# Patient Record
Sex: Female | Born: 1940 | Race: Asian | Hispanic: No
Health system: Southern US, Community
[De-identification: ages and names within clinical notes are randomized; demographics above are authoritative.]

## PROBLEM LIST (undated history)

## (undated) DIAGNOSIS — E119 Type 2 diabetes mellitus without complications: Secondary | ICD-10-CM

## (undated) DIAGNOSIS — E781 Pure hyperglyceridemia: Secondary | ICD-10-CM

## (undated) DIAGNOSIS — R296 Repeated falls: Secondary | ICD-10-CM

## (undated) DIAGNOSIS — Z9989 Dependence on other enabling machines and devices: Secondary | ICD-10-CM

## (undated) DIAGNOSIS — G8929 Other chronic pain: Secondary | ICD-10-CM

## (undated) DIAGNOSIS — S065X9A Traumatic subdural hemorrhage with loss of consciousness of unspecified duration, initial encounter: Secondary | ICD-10-CM

## (undated) DIAGNOSIS — J189 Pneumonia, unspecified organism: Secondary | ICD-10-CM

## (undated) DIAGNOSIS — I4891 Unspecified atrial fibrillation: Secondary | ICD-10-CM

## (undated) DIAGNOSIS — M549 Dorsalgia, unspecified: Secondary | ICD-10-CM

## (undated) DIAGNOSIS — K219 Gastro-esophageal reflux disease without esophagitis: Secondary | ICD-10-CM

## (undated) DIAGNOSIS — M1990 Unspecified osteoarthritis, unspecified site: Secondary | ICD-10-CM

## (undated) DIAGNOSIS — S22080A Wedge compression fracture of T11-T12 vertebra, initial encounter for closed fracture: Secondary | ICD-10-CM

## (undated) DIAGNOSIS — G4733 Obstructive sleep apnea (adult) (pediatric): Secondary | ICD-10-CM

## (undated) HISTORY — DX: Repeated falls: R29.6

---

## 1988-10-17 HISTORY — PX: TOTAL ABDOMINAL HYSTERECTOMY: SHX209

## 1999-06-18 HISTORY — PX: CATARACT EXTRACTION W/ INTRAOCULAR LENS  IMPLANT, BILATERAL: SHX1307

## 2008-01-18 ENCOUNTER — Emergency Department (HOSPITAL_COMMUNITY): Admission: EM | Admit: 2008-01-18 | Discharge: 2008-01-18 | Payer: Self-pay | Admitting: Emergency Medicine

## 2010-10-17 DIAGNOSIS — S22080A Wedge compression fracture of T11-T12 vertebra, initial encounter for closed fracture: Secondary | ICD-10-CM

## 2010-10-17 HISTORY — DX: Wedge compression fracture of T11-T12 vertebra, initial encounter for closed fracture: S22.080A

## 2010-10-17 HISTORY — PX: APPENDECTOMY: SHX54

## 2011-07-12 LAB — DIFFERENTIAL
Basophils Absolute: 0.1
Basophils Relative: 1
Eosinophils Absolute: 0.1
Eosinophils Relative: 0
Monocytes Absolute: 0.4

## 2011-07-12 LAB — CBC
HCT: 39.3
MCV: 90.2
Platelets: 323
RBC: 4.35
WBC: 15.4 — ABNORMAL HIGH

## 2011-07-12 LAB — URINALYSIS, ROUTINE W REFLEX MICROSCOPIC
Bilirubin Urine: NEGATIVE
Ketones, ur: 15 — AB
Nitrite: NEGATIVE
Protein, ur: 30 — AB
Urobilinogen, UA: 0.2

## 2011-07-12 LAB — COMPREHENSIVE METABOLIC PANEL
AST: 18
Albumin: 4.5
Alkaline Phosphatase: 41
BUN: 15
CO2: 24
Chloride: 106
GFR calc Af Amer: >60
GFR calc non Af Amer: >60
Potassium: 3.6
Total Bilirubin: 0.7

## 2011-07-12 LAB — URINE MICROSCOPIC-ADD ON

## 2011-12-20 ENCOUNTER — Ambulatory Visit: Payer: Medicare Other | Attending: Orthopaedic Surgery | Admitting: Physical Therapy

## 2011-12-20 DIAGNOSIS — M2550 Pain in unspecified joint: Secondary | ICD-10-CM | POA: Insufficient documentation

## 2011-12-20 DIAGNOSIS — R293 Abnormal posture: Secondary | ICD-10-CM | POA: Insufficient documentation

## 2011-12-20 DIAGNOSIS — M6281 Muscle weakness (generalized): Secondary | ICD-10-CM | POA: Insufficient documentation

## 2011-12-20 DIAGNOSIS — R5381 Other malaise: Secondary | ICD-10-CM | POA: Insufficient documentation

## 2011-12-20 DIAGNOSIS — IMO0001 Reserved for inherently not codable concepts without codable children: Secondary | ICD-10-CM | POA: Insufficient documentation

## 2011-12-23 ENCOUNTER — Encounter: Payer: Medicare Other | Admitting: Physical Therapy

## 2011-12-28 ENCOUNTER — Ambulatory Visit: Payer: Medicare Other | Admitting: Physical Therapy

## 2011-12-30 ENCOUNTER — Ambulatory Visit: Payer: Medicare Other | Admitting: Physical Therapy

## 2012-01-04 ENCOUNTER — Encounter: Payer: Medicare Other | Admitting: Physical Therapy

## 2012-01-06 ENCOUNTER — Ambulatory Visit: Payer: Medicare Other | Admitting: Physical Therapy

## 2012-01-12 ENCOUNTER — Ambulatory Visit: Payer: Medicare Other | Admitting: Physical Therapy

## 2012-01-25 ENCOUNTER — Ambulatory Visit: Payer: Medicare Other | Attending: Orthopaedic Surgery | Admitting: Physical Therapy

## 2012-01-25 DIAGNOSIS — M25619 Stiffness of unspecified shoulder, not elsewhere classified: Secondary | ICD-10-CM | POA: Insufficient documentation

## 2012-01-25 DIAGNOSIS — IMO0001 Reserved for inherently not codable concepts without codable children: Secondary | ICD-10-CM | POA: Insufficient documentation

## 2012-01-25 DIAGNOSIS — M25519 Pain in unspecified shoulder: Secondary | ICD-10-CM | POA: Insufficient documentation

## 2012-01-25 DIAGNOSIS — M6281 Muscle weakness (generalized): Secondary | ICD-10-CM | POA: Insufficient documentation

## 2012-01-30 ENCOUNTER — Encounter: Payer: Medicare Other | Admitting: Physical Therapy

## 2012-02-01 ENCOUNTER — Encounter: Payer: Medicare Other | Admitting: Physical Therapy

## 2012-02-03 ENCOUNTER — Encounter: Payer: Medicare Other | Admitting: Physical Therapy

## 2012-02-08 ENCOUNTER — Ambulatory Visit: Payer: Medicare Other | Admitting: Physical Therapy

## 2012-02-10 ENCOUNTER — Encounter: Payer: Medicare Other | Admitting: Physical Therapy

## 2012-02-14 ENCOUNTER — Encounter: Payer: Medicare Other | Admitting: Physical Therapy

## 2012-02-15 ENCOUNTER — Ambulatory Visit: Payer: Medicare Other | Attending: Orthopedic Surgery | Admitting: Physical Therapy

## 2012-02-15 ENCOUNTER — Encounter: Payer: Medicare Other | Admitting: Physical Therapy

## 2012-02-15 DIAGNOSIS — M6281 Muscle weakness (generalized): Secondary | ICD-10-CM | POA: Insufficient documentation

## 2012-02-15 DIAGNOSIS — M25619 Stiffness of unspecified shoulder, not elsewhere classified: Secondary | ICD-10-CM | POA: Insufficient documentation

## 2012-02-15 DIAGNOSIS — M25519 Pain in unspecified shoulder: Secondary | ICD-10-CM | POA: Insufficient documentation

## 2012-02-15 DIAGNOSIS — IMO0001 Reserved for inherently not codable concepts without codable children: Secondary | ICD-10-CM | POA: Insufficient documentation

## 2012-02-17 ENCOUNTER — Encounter: Payer: Medicare Other | Admitting: Physical Therapy

## 2012-02-21 ENCOUNTER — Ambulatory Visit: Payer: Medicare Other | Admitting: Physical Therapy

## 2012-02-22 ENCOUNTER — Ambulatory Visit: Payer: Medicare Other | Admitting: Physical Therapy

## 2012-02-28 ENCOUNTER — Ambulatory Visit: Payer: Medicare Other | Admitting: Physical Therapy

## 2012-02-29 ENCOUNTER — Ambulatory Visit: Payer: Medicare Other | Admitting: Physical Therapy

## 2012-03-07 ENCOUNTER — Encounter: Payer: Medicare Other | Admitting: Physical Therapy

## 2012-03-09 ENCOUNTER — Ambulatory Visit: Payer: Medicare Other | Admitting: Physical Therapy

## 2012-03-15 ENCOUNTER — Ambulatory Visit: Payer: Medicare Other | Admitting: Physical Therapy

## 2014-04-16 DIAGNOSIS — S065XAA Traumatic subdural hemorrhage with loss of consciousness status unknown, initial encounter: Secondary | ICD-10-CM

## 2014-04-16 DIAGNOSIS — S065X9A Traumatic subdural hemorrhage with loss of consciousness of unspecified duration, initial encounter: Secondary | ICD-10-CM

## 2014-04-16 HISTORY — DX: Traumatic subdural hemorrhage with loss of consciousness status unknown, initial encounter: S06.5XAA

## 2014-04-16 HISTORY — DX: Traumatic subdural hemorrhage with loss of consciousness of unspecified duration, initial encounter: S06.5X9A

## 2014-05-11 ENCOUNTER — Encounter (HOSPITAL_COMMUNITY): Payer: Self-pay | Admitting: Emergency Medicine

## 2014-05-11 ENCOUNTER — Emergency Department (HOSPITAL_COMMUNITY): Payer: Medicare Other

## 2014-05-11 ENCOUNTER — Inpatient Hospital Stay (HOSPITAL_COMMUNITY)
Admission: EM | Admit: 2014-05-11 | Discharge: 2014-05-14 | DRG: 086 | Disposition: A | Discharge status: Home Health | Payer: Medicare Other | Attending: Internal Medicine | Admitting: Internal Medicine

## 2014-05-11 DIAGNOSIS — Z9989 Dependence on other enabling machines and devices: Secondary | ICD-10-CM

## 2014-05-11 DIAGNOSIS — I482 Chronic atrial fibrillation, unspecified: Secondary | ICD-10-CM

## 2014-05-11 DIAGNOSIS — S22009A Unspecified fracture of unspecified thoracic vertebra, initial encounter for closed fracture: Secondary | ICD-10-CM | POA: Diagnosis present

## 2014-05-11 DIAGNOSIS — Z7901 Long term (current) use of anticoagulants: Secondary | ICD-10-CM

## 2014-05-11 DIAGNOSIS — S065XAA Traumatic subdural hemorrhage with loss of consciousness status unknown, initial encounter: Principal | ICD-10-CM

## 2014-05-11 DIAGNOSIS — G4733 Obstructive sleep apnea (adult) (pediatric): Secondary | ICD-10-CM

## 2014-05-11 DIAGNOSIS — S0083XA Contusion of other part of head, initial encounter: Secondary | ICD-10-CM | POA: Diagnosis present

## 2014-05-11 DIAGNOSIS — S1093XA Contusion of unspecified part of neck, initial encounter: Secondary | ICD-10-CM

## 2014-05-11 DIAGNOSIS — S065X0D Traumatic subdural hemorrhage without loss of consciousness, subsequent encounter: Secondary | ICD-10-CM

## 2014-05-11 DIAGNOSIS — S0990XA Unspecified injury of head, initial encounter: Secondary | ICD-10-CM | POA: Diagnosis not present

## 2014-05-11 DIAGNOSIS — E119 Type 2 diabetes mellitus without complications: Secondary | ICD-10-CM

## 2014-05-11 DIAGNOSIS — Z79899 Other long term (current) drug therapy: Secondary | ICD-10-CM

## 2014-05-11 DIAGNOSIS — Y92009 Unspecified place in unspecified non-institutional (private) residence as the place of occurrence of the external cause: Secondary | ICD-10-CM

## 2014-05-11 DIAGNOSIS — S065X0A Traumatic subdural hemorrhage without loss of consciousness, initial encounter: Secondary | ICD-10-CM

## 2014-05-11 DIAGNOSIS — Z91018 Allergy to other foods: Secondary | ICD-10-CM

## 2014-05-11 DIAGNOSIS — M1990 Unspecified osteoarthritis, unspecified site: Secondary | ICD-10-CM | POA: Diagnosis present

## 2014-05-11 DIAGNOSIS — W108XXA Fall (on) (from) other stairs and steps, initial encounter: Secondary | ICD-10-CM | POA: Diagnosis present

## 2014-05-11 DIAGNOSIS — S40029A Contusion of unspecified upper arm, initial encounter: Secondary | ICD-10-CM | POA: Diagnosis present

## 2014-05-11 DIAGNOSIS — S065X9A Traumatic subdural hemorrhage with loss of consciousness of unspecified duration, initial encounter: Secondary | ICD-10-CM

## 2014-05-11 DIAGNOSIS — S0003XA Contusion of scalp, initial encounter: Secondary | ICD-10-CM | POA: Diagnosis present

## 2014-05-11 DIAGNOSIS — W19XXXA Unspecified fall, initial encounter: Secondary | ICD-10-CM

## 2014-05-11 DIAGNOSIS — Z9089 Acquired absence of other organs: Secondary | ICD-10-CM

## 2014-05-11 DIAGNOSIS — I4891 Unspecified atrial fibrillation: Secondary | ICD-10-CM | POA: Diagnosis present

## 2014-05-11 HISTORY — DX: Type 2 diabetes mellitus without complications: E11.9

## 2014-05-11 HISTORY — DX: Unspecified osteoarthritis, unspecified site: M19.90

## 2014-05-11 HISTORY — DX: Unspecified atrial fibrillation: I48.91

## 2014-05-11 HISTORY — DX: Wedge compression fracture of t11-T12 vertebra, initial encounter for closed fracture: S22.080A

## 2014-05-11 LAB — BASIC METABOLIC PANEL
ANION GAP: 17 — AB (ref 5–15)
BUN: 19 mg/dL (ref 6–23)
CALCIUM: 9.3 mg/dL (ref 8.4–10.5)
CO2: 24 mEq/L (ref 19–32)
CREATININE: 0.55 mg/dL (ref 0.50–1.10)
Chloride: 98 mEq/L (ref 96–112)
GFR calc non Af Amer: >90 mL/min (ref >90)
Glucose, Bld: 203 mg/dL — ABNORMAL HIGH (ref 70–99)
Potassium: 4.5 mEq/L (ref 3.7–5.3)
Sodium: 139 mEq/L (ref 137–147)

## 2014-05-11 LAB — CBC
HEMATOCRIT: 39.5 % (ref 36.0–46.0)
Hemoglobin: 13.2 g/dL (ref 12.0–15.0)
MCH: 31.6 pg (ref 26.0–34.0)
MCHC: 33.4 g/dL (ref 30.0–36.0)
MCV: 94.5 fL (ref 78.0–100.0)
Platelets: 312 10*3/uL (ref 150–400)
RBC: 4.18 MIL/uL (ref 3.87–5.11)
RDW: 12.2 % (ref 11.5–15.5)
WBC: 6.9 10*3/uL (ref 4.0–10.5)

## 2014-05-11 LAB — PROTIME-INR
INR: 1.72 — AB (ref 0.00–1.49)
PROTHROMBIN TIME: 20.2 s — AB (ref 11.6–15.2)

## 2014-05-11 LAB — I-STAT TROPONIN, ED: Troponin i, poc: 0 ng/mL (ref 0.00–0.08)

## 2014-05-11 MED ORDER — MORPHINE SULFATE 4 MG/ML IJ SOLN
4.0000 mg | Freq: Once | INTRAMUSCULAR | Status: AC
  Administered 2014-05-12: 4 mg via INTRAVENOUS
  Filled 2014-05-11: qty 1

## 2014-05-11 MED ORDER — ONDANSETRON HCL 4 MG/2ML IJ SOLN
4.0000 mg | Freq: Once | INTRAMUSCULAR | Status: AC
  Administered 2014-05-12: 4 mg via INTRAVENOUS
  Filled 2014-05-11: qty 2

## 2014-05-11 NOTE — ED Notes (Signed)
[facility].

## 2014-05-11 NOTE — ED Notes (Signed)
MD at bedside. 

## 2014-05-11 NOTE — ED Notes (Signed)
Patient transported to X-ray 

## 2014-05-11 NOTE — ED Notes (Addendum)
Does not speak [place], daughter parking car, pt fell and hit head, takes coumadin, R forehead abrasion and hematoma noted. Alert, NAD, calm, interactive, verbal. Also abrasions (also wounds covered with bandages PTA) noted to R arm and elbow.

## 2014-05-11 NOTE — ED Notes (Addendum)
Pt back to room A1, daughter present, daughter translating at this time. (denies neck pain, "just very dizzy", also some confusion, "answering some questions wrong" per daughter, pt "tripped and fell down 3-4 steps, tripped while taking a pan of grease out, "not sure if she fell going down steps or coming back in & up steps".

## 2014-05-11 NOTE — ED Provider Notes (Addendum)
CSN: [id number]     Arrival date & time 05/11/14  2151 History   First MD Initiated Contact with Patient 05/11/14 2200     Chief Complaint  Patient presents with  . Fall  . Head Injury     (Consider location/radiation/quality/duration/timing/severity/associated sxs/prior Treatment) Patient is a 73 y.o. female presenting with fall and head injury. The history is provided by the patient.  Fall This is a new problem. The current episode started 1 to 2 hours ago. Episode frequency: once. The problem has not changed since onset.Pertinent negatives include no chest pain and no shortness of breath. Nothing aggravates the symptoms. Nothing relieves the symptoms. She has tried nothing for the symptoms.  Head Injury   Past Medical History  Diagnosis Date  . OSA (obstructive sleep apnea)   . Atrial fibrillation   . Diabetes mellitus without complication   . Osteoarthritis   . Compression fracture of T12 vertebra    Past Surgical History  Procedure Laterality Date  . Abdominal hysterectomy    . Appendectomy     No family history on file. History  Substance Use Topics  . Smoking status: Never Smoker   . Smokeless tobacco: Not on file  . Alcohol Use: No   OB History   Grav Para Term Preterm Abortions TAB SAB Ect Mult Living                 Review of Systems  Constitutional: Negative for fever and chills.  Respiratory: Negative for cough and shortness of breath.   Cardiovascular: Negative for chest pain and leg swelling.  All other systems reviewed and are negative.     Allergies  Review of patient's allergies indicates no known allergies.  Home Medications   Prior to Admission medications   Not on File   BP 144/80  Pulse 98  Temp(Src) 98 F (36.7 C) (Oral)  Resp 18  Ht 4\' 10"  (1.473 m)  Wt 120 lb (54.432 kg)  BMI 25.09 kg/m2  SpO2 99% Physical Exam  Nursing note and vitals reviewed. Constitutional: She appears well-developed and well-nourished. No distress.   HENT:  Head: Normocephalic.    Mouth/Throat: Oropharynx is clear and moist.  Eyes: EOM are normal. Pupils are equal, round, and reactive to light.  Neck: Normal range of motion. Neck supple.  Cardiovascular: Normal rate and regular rhythm.  Exam reveals no friction rub.   No murmur heard. Pulmonary/Chest: Effort normal and breath sounds normal. No respiratory distress. She has no wheezes. She has no rales.  Abdominal: Soft. She exhibits no distension. There is no tenderness. There is no rebound.  Musculoskeletal: Normal range of motion. She exhibits no edema.       Right upper arm: She exhibits swelling (midshaft).  Neurological: She is alert. No cranial nerve deficit or sensory deficit. She exhibits normal muscle tone. GCS eye subscore is 4. GCS verbal subscore is 4. GCS motor subscore is 6.  Skin: She is not diaphoretic.    ED Course  Procedures (including critical care time) Labs Review Labs Reviewed  BASIC METABOLIC PANEL  CBC  PROTIME-INR  Rosezena SensorI-STAT TROPOININ, ED    Imaging Review Dg Thoracic Spine 2 View  05/11/2014   CLINICAL DATA:  Larey SeatFell today with back pain  EXAM: THORACIC SPINE - 2 VIEW  COMPARISON:  01/18/2008  FINDINGS: Mild to moderate sigmoid scoliotic curvature the thoracolumbar spine. There is a 50% compression fracture at T12. T12 is moderately sclerotic with osteophytes at T11-12 and T12-L1. There is  moderate multilevel spondylosis with no other fractures.  IMPRESSION: Known moderate T9 compression deformity. No other abnormalities. This fracture is new from 2009.   Electronically Signed   By: Esperanza Heir M.D.   On: 05/11/2014 23:23   Dg Lumbar Spine Complete  05/11/2014   CLINICAL DATA:  Mid upper back pain secondary to a fall today. History of T12 fracture.  EXAM: LUMBAR SPINE - COMPLETE 4+ VIEW  COMPARISON:  CT scan of the abdomen dated 01/18/2008  FINDINGS: There is an old severe compression fracture of the T12 vertebral body with loss of 75% of the anterior  vertebral body height. This accentuates the thoracic kyphosis.  Lumbar spine is diffusely osteopenic but there is no disc space narrowing or subluxation. There are slight degenerative changes of the facet joints at L4-5 and L5-S1 on the right.  IMPRESSION: No acute abnormalities of the lumbar spine. Old compression fracture of T12.   Electronically Signed   By: Geanie Cooley M.D.   On: 05/11/2014 23:26   Ct Head Wo Contrast  05/11/2014   CLINICAL DATA:  Fall, head trauma, on Coumadin.  EXAM: CT HEAD WITHOUT CONTRAST  CT CERVICAL SPINE WITHOUT CONTRAST  TECHNIQUE: Multidetector CT imaging of the head and cervical spine was performed following the standard protocol without intravenous contrast. Multiplanar CT image reconstructions of the cervical spine were also generated.  COMPARISON:  None.  FINDINGS: CT HEAD FINDINGS  The ventricles and sulci are normal for age. No intraparenchymal hemorrhage, mass effect nor midline shift. Patchy supratentorial white matter hypodensities are within normal range for patient's age and though non-specific suggest sequelae of chronic small vessel ischemic disease. Symmetric basal ganglia mineralization. No acute large vascular territory infarcts.  3 mm extensive right temporal parietal extra-axial fluid collections. Trace right temporal parietal subarachnoid blood. Basal cisterns are patent. Moderate calcific atherosclerosis of the carotid siphons.  Large right periorbital/frontal scalp hematoma without subcutaneous gas or radiopaque foreign bodies. No skull fracture. The included ocular globes and orbital contents are non-suspicious. Status post bilateral ocular lens implants. The mastoid aircells and included paranasal sinuses are well-aerated.  CT CERVICAL SPINE FINDINGS  Cervical vertebral bodies and posterior elements are intact and aligned with straightened cervical lordosis. Intervertebral disc heights preserved. No destructive bony lesions. C1-2 articulation maintained.  Included prevertebral and paraspinal soft tissues are unremarkable.  IMPRESSION: 3 mm acute right temporoparietal subdural hematoma with underlying trace subarachnoid hemorrhage.  Large right cerebral/frontal scalp hematoma without underlying skull fracture.  CT cervical spine: Straightened cervical lordosis  Findings discussed with and reconfirmed by Dr.WILLIAM Stewart Pimenta on7/26/2015at11:58 pm.   Electronically Signed   By: Awilda Metro   On: 05/11/2014 23:59   Ct Cervical Spine Wo Contrast  05/11/2014   CLINICAL DATA:  Fall, head trauma, on Coumadin.  EXAM: CT HEAD WITHOUT CONTRAST  CT CERVICAL SPINE WITHOUT CONTRAST  TECHNIQUE: Multidetector CT imaging of the head and cervical spine was performed following the standard protocol without intravenous contrast. Multiplanar CT image reconstructions of the cervical spine were also generated.  COMPARISON:  None.  FINDINGS: CT HEAD FINDINGS  The ventricles and sulci are normal for age. No intraparenchymal hemorrhage, mass effect nor midline shift. Patchy supratentorial white matter hypodensities are within normal range for patient's age and though non-specific suggest sequelae of chronic small vessel ischemic disease. Symmetric basal ganglia mineralization. No acute large vascular territory infarcts.  3 mm extensive right temporal parietal extra-axial fluid collections. Trace right temporal parietal subarachnoid blood. Basal cisterns are patent. Moderate  calcific atherosclerosis of the carotid siphons.  Large right periorbital/frontal scalp hematoma without subcutaneous gas or radiopaque foreign bodies. No skull fracture. The included ocular globes and orbital contents are non-suspicious. Status post bilateral ocular lens implants. The mastoid aircells and included paranasal sinuses are well-aerated.  CT CERVICAL SPINE FINDINGS  Cervical vertebral bodies and posterior elements are intact and aligned with straightened cervical lordosis. Intervertebral disc heights  preserved. No destructive bony lesions. C1-2 articulation maintained. Included prevertebral and paraspinal soft tissues are unremarkable.  IMPRESSION: 3 mm acute right temporoparietal subdural hematoma with underlying trace subarachnoid hemorrhage.  Large right cerebral/frontal scalp hematoma without underlying skull fracture.  CT cervical spine: Straightened cervical lordosis  Findings discussed with and reconfirmed by Dr.WILLIAM Sherrie Marsan on7/26/2015at11:58 pm.   Electronically Signed   By: Awilda Metro   On: 05/11/2014 23:59   Dg Humerus Right  05/11/2014   CLINICAL DATA:  Fall today, anterior pain to right humerus  EXAM: RIGHT HUMERUS - 2+ VIEW  COMPARISON:  None.  FINDINGS: There is no evidence of fracture or other focal bone lesions. Soft tissues are unremarkable.  IMPRESSION: Negative.   Electronically Signed   By: Esperanza Heir M.D.   On: 05/11/2014 23:24     EKG Interpretation None     CRITICAL CARE Performed by: Dagmar Hait   Total critical care time: 30 minutes  Critical care time was exclusive of separately billable procedures and treating other patients.  Critical care was necessary to treat or prevent imminent or life-threatening deterioration.  Critical care was time spent personally by me on the following activities: development of treatment plan with patient and/or surrogate as well as nursing, discussions with consultants, evaluation of patient's response to treatment, examination of patient, obtaining history from patient or surrogate, ordering and performing treatments and interventions, ordering and review of laboratory studies, ordering and review of radiographic studies, pulse oximetry and re-evaluation of patient's condition.  MDM   Final diagnoses:  Subdural hematoma  Fall, initial encounter    85F here s/p fall. Felt dizzy afterwards. Unknown LOC, was ambulatory back to her house. INR earlier today 2 - on Coumadin for Afib.  Patient here with  stable vitals, acting ok with me. Daughter speaks [place] with patient, states some mild confusion. Denies preceding CP, SOB. States some mid thoracic back pain and some lumbar spine pain. R midshaft humeral hematoma. Will check labs, Head CT. CT shows small SDH and possible SAH. No midline shift. Vitamin K given. Will speak with Neurosurgery, Dr. Franky Macho, who will consult. Medicine admitting.   Dagmar Hait, MD 05/12/14 0045  Dagmar Hait, MD 05/12/14 [phone]

## 2014-05-12 ENCOUNTER — Inpatient Hospital Stay (HOSPITAL_COMMUNITY): Payer: Medicare Other

## 2014-05-12 ENCOUNTER — Encounter (HOSPITAL_COMMUNITY): Payer: Self-pay | Admitting: Internal Medicine

## 2014-05-12 DIAGNOSIS — Y92009 Unspecified place in unspecified non-institutional (private) residence as the place of occurrence of the external cause: Secondary | ICD-10-CM | POA: Diagnosis not present

## 2014-05-12 DIAGNOSIS — S065XAA Traumatic subdural hemorrhage with loss of consciousness status unknown, initial encounter: Secondary | ICD-10-CM | POA: Diagnosis present

## 2014-05-12 DIAGNOSIS — S40029A Contusion of unspecified upper arm, initial encounter: Secondary | ICD-10-CM | POA: Diagnosis present

## 2014-05-12 DIAGNOSIS — G4733 Obstructive sleep apnea (adult) (pediatric): Secondary | ICD-10-CM | POA: Diagnosis present

## 2014-05-12 DIAGNOSIS — E119 Type 2 diabetes mellitus without complications: Secondary | ICD-10-CM | POA: Diagnosis present

## 2014-05-12 DIAGNOSIS — Z9989 Dependence on other enabling machines and devices: Secondary | ICD-10-CM

## 2014-05-12 DIAGNOSIS — S0003XA Contusion of scalp, initial encounter: Secondary | ICD-10-CM | POA: Diagnosis present

## 2014-05-12 DIAGNOSIS — S0990XA Unspecified injury of head, initial encounter: Secondary | ICD-10-CM | POA: Diagnosis present

## 2014-05-12 DIAGNOSIS — W108XXA Fall (on) (from) other stairs and steps, initial encounter: Secondary | ICD-10-CM | POA: Diagnosis present

## 2014-05-12 DIAGNOSIS — Z79899 Other long term (current) drug therapy: Secondary | ICD-10-CM | POA: Diagnosis not present

## 2014-05-12 DIAGNOSIS — I4891 Unspecified atrial fibrillation: Secondary | ICD-10-CM | POA: Diagnosis present

## 2014-05-12 DIAGNOSIS — Z91018 Allergy to other foods: Secondary | ICD-10-CM | POA: Diagnosis not present

## 2014-05-12 DIAGNOSIS — I482 Chronic atrial fibrillation, unspecified: Secondary | ICD-10-CM | POA: Diagnosis present

## 2014-05-12 DIAGNOSIS — S065X9A Traumatic subdural hemorrhage with loss of consciousness of unspecified duration, initial encounter: Secondary | ICD-10-CM | POA: Diagnosis present

## 2014-05-12 DIAGNOSIS — S065X0A Traumatic subdural hemorrhage without loss of consciousness, initial encounter: Secondary | ICD-10-CM

## 2014-05-12 DIAGNOSIS — S22009A Unspecified fracture of unspecified thoracic vertebra, initial encounter for closed fracture: Secondary | ICD-10-CM | POA: Diagnosis present

## 2014-05-12 DIAGNOSIS — Z7901 Long term (current) use of anticoagulants: Secondary | ICD-10-CM | POA: Diagnosis not present

## 2014-05-12 DIAGNOSIS — Z9089 Acquired absence of other organs: Secondary | ICD-10-CM | POA: Diagnosis not present

## 2014-05-12 DIAGNOSIS — M1990 Unspecified osteoarthritis, unspecified site: Secondary | ICD-10-CM | POA: Diagnosis present

## 2014-05-12 DIAGNOSIS — I6200 Nontraumatic subdural hemorrhage, unspecified: Secondary | ICD-10-CM

## 2014-05-12 LAB — COMPREHENSIVE METABOLIC PANEL
ALBUMIN: 4.3 g/dL (ref 3.5–5.2)
ALK PHOS: 38 U/L — AB (ref 39–117)
ALT: 16 U/L (ref 0–35)
ANION GAP: 15 (ref 5–15)
AST: 22 U/L (ref 0–37)
BUN: 16 mg/dL (ref 6–23)
CO2: 27 mEq/L (ref 19–32)
Calcium: 9.1 mg/dL (ref 8.4–10.5)
Chloride: 102 mEq/L (ref 96–112)
Creatinine, Ser: 0.53 mg/dL (ref 0.50–1.10)
GFR calc Af Amer: >90 mL/min (ref >90)
GFR calc non Af Amer: >90 mL/min (ref >90)
GLUCOSE: 152 mg/dL — AB (ref 70–99)
POTASSIUM: 4.6 meq/L (ref 3.7–5.3)
Sodium: 144 mEq/L (ref 137–147)
Total Bilirubin: 0.4 mg/dL (ref 0.3–1.2)
Total Protein: 7.1 g/dL (ref 6.0–8.3)

## 2014-05-12 LAB — PROTIME-INR
INR: 1.1 (ref 0.00–1.49)
INR: 1.1 (ref 0.00–1.49)
INR: 1.11 (ref 0.00–1.49)
INR: 1.04 (ref 0.00–1.49)
PROTHROMBIN TIME: 14.2 s (ref 11.6–15.2)
PROTHROMBIN TIME: 13.6 s (ref 11.6–15.2)
Prothrombin Time: 14.2 seconds (ref 11.6–15.2)
Prothrombin Time: 14.3 seconds (ref 11.6–15.2)

## 2014-05-12 LAB — GLUCOSE, CAPILLARY
GLUCOSE-CAPILLARY: 171 mg/dL — AB (ref 70–99)
GLUCOSE-CAPILLARY: 105 mg/dL — AB (ref 70–99)
GLUCOSE-CAPILLARY: 121 mg/dL — AB (ref 70–99)
Glucose-Capillary: 116 mg/dL — ABNORMAL HIGH (ref 70–99)
Glucose-Capillary: 105 mg/dL — ABNORMAL HIGH (ref 70–99)

## 2014-05-12 LAB — HEMOGLOBIN A1C
Hgb A1c MFr Bld: 6.3 % — ABNORMAL HIGH (ref <5.7)
Mean Plasma Glucose: 134 mg/dL — ABNORMAL HIGH (ref <117)

## 2014-05-12 LAB — TSH: TSH: 0.729 u[IU]/mL (ref 0.350–4.500)

## 2014-05-12 LAB — CBC
HEMATOCRIT: 37.6 % (ref 36.0–46.0)
Hemoglobin: 12.4 g/dL (ref 12.0–15.0)
MCH: 31.2 pg (ref 26.0–34.0)
MCHC: 33 g/dL (ref 30.0–36.0)
MCV: 94.5 fL (ref 78.0–100.0)
Platelets: 285 10*3/uL (ref 150–400)
RBC: 3.98 MIL/uL (ref 3.87–5.11)
RDW: 12.3 % (ref 11.5–15.5)
WBC: 12.8 10*3/uL — ABNORMAL HIGH (ref 4.0–10.5)

## 2014-05-12 LAB — MAGNESIUM: Magnesium: 2 mg/dL (ref 1.5–2.5)

## 2014-05-12 LAB — MRSA PCR SCREENING: MRSA by PCR: NEGATIVE

## 2014-05-12 LAB — PHOSPHORUS: Phosphorus: 3.4 mg/dL (ref 2.3–4.6)

## 2014-05-12 MED ORDER — SODIUM CHLORIDE 0.9 % IJ SOLN
3.0000 mL | Freq: Two times a day (BID) | INTRAMUSCULAR | Status: DC
Start: 2014-05-12 — End: 2014-05-12
  Administered 2014-05-12: 3 mL via INTRAVENOUS

## 2014-05-12 MED ORDER — VITAMIN K1 10 MG/ML IJ SOLN
10.0000 mg | Freq: Once | INTRAVENOUS | Status: AC
  Administered 2014-05-12: 10 mg via INTRAVENOUS
  Filled 2014-05-12: qty 1

## 2014-05-12 MED ORDER — BISACODYL 5 MG PO TBEC
5.0000 mg | DELAYED_RELEASE_TABLET | Freq: Every day | ORAL | Status: DC | PRN
  Administered 2014-05-12 – 2014-05-14 (×2): 5 mg via ORAL
  Filled 2014-05-12 (×2): qty 1

## 2014-05-12 MED ORDER — TRAMADOL HCL 50 MG PO TABS
50.0000 mg | ORAL_TABLET | Freq: Two times a day (BID) | ORAL | Status: DC
  Administered 2014-05-12 – 2014-05-14 (×5): 50 mg via ORAL
  Filled 2014-05-12 (×5): qty 1

## 2014-05-12 MED ORDER — HYDROCODONE-ACETAMINOPHEN 5-325 MG PO TABS
1.0000 | ORAL_TABLET | ORAL | Status: DC | PRN
  Administered 2014-05-12: 1 via ORAL
  Filled 2014-05-12: qty 1

## 2014-05-12 MED ORDER — SODIUM CHLORIDE 0.9 % IV SOLN: 250.0000 mL | INTRAVENOUS | Status: DC | PRN

## 2014-05-12 MED ORDER — BACITRACIN ZINC 500 UNIT/GM EX OINT
1.0000 "application " | TOPICAL_OINTMENT | Freq: Two times a day (BID) | CUTANEOUS | Status: DC
  Administered 2014-05-12 – 2014-05-13 (×5): 1 via TOPICAL
  Filled 2014-05-12: qty 0.9

## 2014-05-12 MED ORDER — INSULIN ASPART 100 UNIT/ML ~~LOC~~ SOLN
0.0000 [IU] | Freq: Three times a day (TID) | SUBCUTANEOUS | Status: DC
  Administered 2014-05-14: 1 [IU] via SUBCUTANEOUS

## 2014-05-12 MED ORDER — ONDANSETRON HCL 4 MG/2ML IJ SOLN
4.0000 mg | Freq: Four times a day (QID) | INTRAMUSCULAR | Status: DC | PRN
  Administered 2014-05-13: 4 mg via INTRAVENOUS
  Filled 2014-05-12: qty 2

## 2014-05-12 MED ORDER — ONDANSETRON HCL 4 MG PO TABS: 4.0000 mg | ORAL_TABLET | Freq: Four times a day (QID) | ORAL | Status: DC | PRN

## 2014-05-12 MED ORDER — SODIUM CHLORIDE 0.9 % IJ SOLN: 3.0000 mL | INTRAMUSCULAR | Status: DC | PRN

## 2014-05-12 MED ORDER — DOCUSATE SODIUM 100 MG PO CAPS
100.0000 mg | ORAL_CAPSULE | Freq: Two times a day (BID) | ORAL | Status: DC
  Administered 2014-05-12 – 2014-05-14 (×5): 100 mg via ORAL
  Filled 2014-05-12 (×5): qty 1

## 2014-05-12 MED ORDER — ACETAMINOPHEN 650 MG RE SUPP: 650.0000 mg | Freq: Four times a day (QID) | RECTAL | Status: DC | PRN

## 2014-05-12 MED ORDER — ACETAMINOPHEN 325 MG PO TABS
650.0000 mg | ORAL_TABLET | Freq: Four times a day (QID) | ORAL | Status: DC | PRN
  Administered 2014-05-13 (×2): 650 mg via ORAL
  Filled 2014-05-12 (×2): qty 2

## 2014-05-12 MED ORDER — INSULIN ASPART 100 UNIT/ML ~~LOC~~ SOLN
0.0000 [IU] | SUBCUTANEOUS | Status: DC
  Administered 2014-05-12: 2 [IU] via SUBCUTANEOUS

## 2014-05-12 MED ORDER — SODIUM CHLORIDE 0.9 % IJ SOLN: 3.0000 mL | Freq: Two times a day (BID) | INTRAMUSCULAR | Status: DC

## 2014-05-12 MED ORDER — PROTHROMBIN COMPLEX CONC HUMAN 500 UNITS IV KIT
1569.0000 [IU] | PACK | INTRAVENOUS | Status: AC
  Administered 2014-05-12: 1569 [IU] via INTRAVENOUS
  Filled 2014-05-12: qty 63

## 2014-05-12 MED ORDER — MORPHINE SULFATE 2 MG/ML IJ SOLN
2.0000 mg | INTRAMUSCULAR | Status: DC | PRN
Start: 2014-05-12 — End: 2014-05-14

## 2014-05-12 NOTE — H&P (Signed)
PCP:    [name], MD  [facility] Complaint:  Fall and confusion  HPI: [name] is a 73 y.o. female   has a past medical history of OSA (obstructive sleep apnea); Atrial fibrillation; Diabetes mellitus without complication; Osteoarthritis; Compression fracture of T12 vertebra; and T12 compression fracture.   Presented with  Patient fell down around 9:15 pm on 05/11/14. She was able to get up and walk back to the house and let her family know. She was confused and light headed. Started to get nausea. Family brought her to ER. CT scan showed small 3 mm subdural. PAtiet is on coumadin for hx of A.fib. In ER she was reversed with vitamin K and [facility] Hospitalist was called for admission for subdural hematoma  Review of Systems:    Pertinent positives include:  Fall, confusion  Constitutional:  No weight loss, night sweats, Fevers, chills, fatigue, weight loss  HEENT:  No headaches, Difficulty swallowing,Tooth/dental problems,Sore throat,  No sneezing, itching, ear ache, nasal congestion, post nasal drip,  Cardio-vascular:  No chest pain, Orthopnea, PND, anasarca, dizziness, palpitations.no Bilateral lower extremity swelling  GI:  No heartburn, indigestion, abdominal pain, nausea, vomiting, diarrhea, change in bowel habits, loss of appetite, melena, blood in stool, hematemesis Resp:  no shortness of breath at rest. No dyspnea on exertion, No excess mucus, no productive cough, No non-productive cough, No coughing up of blood.No change in color of mucus.No wheezing. Skin:  no rash or lesions. No jaundice GU:  no dysuria, change in color of urine, no urgency or frequency. No straining to urinate.  No flank pain.  Musculoskeletal:  No joint pain or no joint swelling. No decreased range of motion. No back pain.  Psych:  No change in mood or affect. No depression or anxiety. No memory loss.  Neuro: no localizing neurological complaints, no tingling, no  weakness, no double vision, no gait abnormality, no slurred speech,     Otherwise ROS are negative except for above, 10 systems were reviewed  Past Medical History: Past Medical History  Diagnosis Date  . OSA (obstructive sleep apnea)   . Atrial fibrillation   . Diabetes mellitus without complication   . Osteoarthritis   . Compression fracture of T12 vertebra   . T12 compression fracture    Past Surgical History  Procedure Laterality Date  . Abdominal hysterectomy    . Appendectomy       Medications: Prior to Admission medications   Medication Sig Start Date End Date Taking? Authorizing Provider  alendronate (FOSAMAX) 70 MG tablet Take 70 mg by mouth once a week. On mondays 04/30/14  Yes Historical Provider, MD  atenolol (TENORMIN) 25 MG tablet Take 25 mg by mouth daily. 03/15/14  Yes Historical Provider, MD  b complex vitamins tablet Take 1 tablet by mouth daily.   Yes Historical Provider, MD  Calcium-Vitamin D (CALTRATE 600 PLUS-VIT D PO) Take 1 tablet by mouth 2 (two) times daily.   Yes Historical Provider, MD  fluticasone (FLONASE) 50 MCG/ACT nasal spray Place 1 spray into both nostrils daily as needed. 02/18/14  Yes Historical Provider, MD  HYDROcodone-acetaminophen (NORCO/VICODIN) 5-325 MG per tablet Take 1 tablet by mouth every 6 (six) hours as needed for severe pain.   Yes Historical Provider, MD  Boris Lown Oil 1000 MG CAPS Take 1,000 mg by mouth daily.   Yes Historical Provider, MD  metFORMIN (GLUCOPHAGE-XR) 500 MG 24 hr tablet Take 750 mg by mouth 2 (two) times daily. 04/30/14  Yes Historical Provider, MD  Multiple Vitamin (DAILY MULTIVITAMIN PO) Take 1 tablet by mouth daily.   Yes Historical Provider, MD  pravastatin (PRAVACHOL) 10 MG tablet Take 10 mg by mouth daily. 04/30/14  Yes Historical Provider, MD  traMADol (ULTRAM) 50 MG tablet Take 50 mg by mouth 2 (two) times daily.  02/17/14  Yes Historical Provider, MD  warfarin (COUMADIN) 1 MG tablet Take 3-3.5 mg by mouth See admin  instructions. 3.5 mg on fridays 3.0 mg all other days 04/30/14  Yes Historical Provider, MD    Allergies:   Allergies  Allergen Reactions  . Banana Swelling    Hands and feet  . Chocolate Itching  . Peanut-Containing Drug Products Itching    Throat itches, no swelling    Social History:  Ambulatory  Independently  Lives at home With family   reports that she has never smoked. She does not have any smokeless tobacco history on file. She reports that she does not drink alcohol or use illicit drugs.    Family History: Family history is unknown by patient.    Physical Exam: Patient Vitals for the past 24 hrs:  BP Temp Temp src Pulse Resp SpO2 Height Weight  05/12/14 0100 132/74 mmHg - - 90 17 96 % - -  05/12/14 0045 132/82 mmHg - - 94 24 97 % - -  05/12/14 0030 140/80 mmHg - - 100 16 97 % - -  05/11/14 2230 158/83 mmHg - - 86 20 99 % - -  05/11/14 2154 144/80 mmHg 98 F (36.7 C) Oral 98 18 99 % 4\' 10"  (1.473 m) 54.432 kg (120 lb)    1. General:  in No Acute distress 2. Psychological: Alert and  Oriented to self 3. Head/ENT:    Moist Mucous Membranes                          Head Non traumatic, neck supple                          Normal   Dentition 4. SKIN: normal   Skin turgor,  Skin clean Dry and intact no rash 5. Heart: Regular rate and rhythm no Murmur, Rub or gallop 6. Lungs: Clear to auscultation bilaterally, no wheezes or crackles   7. Abdomen: Soft, non-tender, Non distended 8. Lower extremities: no clubbing, cyanosis, or edema 9. Neurologically: CN 2-12 intact strength 5/5 in all 4 ext.  10. MSK: Normal range of motion  body mass index is 25.09 kg/(m^2).   Labs on Admission:   Recent Labs  05/11/14 2211  NA 139  K 4.5  CL 98  CO2 24  GLUCOSE 203*  BUN 19  CREATININE 0.55  CALCIUM 9.3   No results found for this basename: AST, ALT, ALKPHOS, BILITOT, PROT, ALBUMIN,  in the last 72 hours No results found for this basename: LIPASE, AMYLASE,  in the  last 72 hours  Recent Labs  05/11/14 2211  WBC 6.9  HGB 13.2  HCT 39.5  MCV 94.5  PLT 312   No results found for this basename: CKTOTAL, CKMB, CKMBINDEX, TROPONINI,  in the last 72 hours No results found for this basename: TSH, T4TOTAL, FREET3, T3FREE, THYROIDAB,  in the last 72 hours No results found for this basename: VITAMINB12, FOLATE, FERRITIN, TIBC, IRON, RETICCTPCT,  in the last 72 hours No results found for this basename: HGBA1C    Estimated Creatinine  Clearance: 46.5 ml/min (by C-G formula based on Cr of 0.55). ABG No results found for this basename: phart, pco2, po2, hco3, tco2, acidbasedef, o2sat     No results found for this basename: DDIMER     Other results:  I have pearsonaly reviewed this: ECG REPORT  Rate: 93  Rhythm: a.fib ST&T Change: no ischemia   BNP (last 3 results) No results found for this basename: PROBNP,  in the last 8760 hours  Filed Weights   05/11/14 2154  Weight: 54.432 kg (120 lb)    Cultures: No results found for this basename: sdes, specrequest, cult, reptstatus       Radiological Exams on Admission: Dg Thoracic Spine 2 View  05/11/2014   CLINICAL DATA:  Larey Seat today with back pain  EXAM: THORACIC SPINE - 2 VIEW  COMPARISON:  01/18/2008  FINDINGS: Mild to moderate sigmoid scoliotic curvature the thoracolumbar spine. There is a 50% compression fracture at T12. T12 is moderately sclerotic with osteophytes at T11-12 and T12-L1. There is moderate multilevel spondylosis with no other fractures.  IMPRESSION: Known moderate T9 compression deformity. No other abnormalities. This fracture is new from 2009.   Electronically Signed   By: Esperanza Heir M.D.   On: 05/11/2014 23:23   Dg Lumbar Spine Complete  05/11/2014   CLINICAL DATA:  Mid upper back pain secondary to a fall today. History of T12 fracture.  EXAM: LUMBAR SPINE - COMPLETE 4+ VIEW  COMPARISON:  CT scan of the abdomen dated 01/18/2008  FINDINGS: There is an old severe compression  fracture of the T12 vertebral body with loss of 75% of the anterior vertebral body height. This accentuates the thoracic kyphosis.  Lumbar spine is diffusely osteopenic but there is no disc space narrowing or subluxation. There are slight degenerative changes of the facet joints at L4-5 and L5-S1 on the right.  IMPRESSION: No acute abnormalities of the lumbar spine. Old compression fracture of T12.   Electronically Signed   By: Geanie Cooley M.D.   On: 05/11/2014 23:26   Ct Head Wo Contrast  05/11/2014   CLINICAL DATA:  Fall, head trauma, on Coumadin.  EXAM: CT HEAD WITHOUT CONTRAST  CT CERVICAL SPINE WITHOUT CONTRAST  TECHNIQUE: Multidetector CT imaging of the head and cervical spine was performed following the standard protocol without intravenous contrast. Multiplanar CT image reconstructions of the cervical spine were also generated.  COMPARISON:  None.  FINDINGS: CT HEAD FINDINGS  The ventricles and sulci are normal for age. No intraparenchymal hemorrhage, mass effect nor midline shift. Patchy supratentorial white matter hypodensities are within normal range for patient's age and though non-specific suggest sequelae of chronic small vessel ischemic disease. Symmetric basal ganglia mineralization. No acute large vascular territory infarcts.  3 mm extensive right temporal parietal extra-axial fluid collections. Trace right temporal parietal subarachnoid blood. Basal cisterns are patent. Moderate calcific atherosclerosis of the carotid siphons.  Large right periorbital/frontal scalp hematoma without subcutaneous gas or radiopaque foreign bodies. No skull fracture. The included ocular globes and orbital contents are non-suspicious. Status post bilateral ocular lens implants. The mastoid aircells and included paranasal sinuses are well-aerated.  CT CERVICAL SPINE FINDINGS  Cervical vertebral bodies and posterior elements are intact and aligned with straightened cervical lordosis. Intervertebral disc heights  preserved. No destructive bony lesions. C1-2 articulation maintained. Included prevertebral and paraspinal soft tissues are unremarkable.  IMPRESSION: 3 mm acute right temporoparietal subdural hematoma with underlying trace subarachnoid hemorrhage.  Large right cerebral/frontal scalp hematoma without underlying skull fracture.  CT cervical spine: Straightened cervical lordosis  Findings discussed with and reconfirmed by Dr.WILLIAM WALDEN on7/26/2015at11:58 pm.   Electronically Signed   By: Awilda Metro   On: 05/11/2014 23:59   Ct Cervical Spine Wo Contrast  05/11/2014   CLINICAL DATA:  Fall, head trauma, on Coumadin.  EXAM: CT HEAD WITHOUT CONTRAST  CT CERVICAL SPINE WITHOUT CONTRAST  TECHNIQUE: Multidetector CT imaging of the head and cervical spine was performed following the standard protocol without intravenous contrast. Multiplanar CT image reconstructions of the cervical spine were also generated.  COMPARISON:  None.  FINDINGS: CT HEAD FINDINGS  The ventricles and sulci are normal for age. No intraparenchymal hemorrhage, mass effect nor midline shift. Patchy supratentorial white matter hypodensities are within normal range for patient's age and though non-specific suggest sequelae of chronic small vessel ischemic disease. Symmetric basal ganglia mineralization. No acute large vascular territory infarcts.  3 mm extensive right temporal parietal extra-axial fluid collections. Trace right temporal parietal subarachnoid blood. Basal cisterns are patent. Moderate calcific atherosclerosis of the carotid siphons.  Large right periorbital/frontal scalp hematoma without subcutaneous gas or radiopaque foreign bodies. No skull fracture. The included ocular globes and orbital contents are non-suspicious. Status post bilateral ocular lens implants. The mastoid aircells and included paranasal sinuses are well-aerated.  CT CERVICAL SPINE FINDINGS  Cervical vertebral bodies and posterior elements are intact and  aligned with straightened cervical lordosis. Intervertebral disc heights preserved. No destructive bony lesions. C1-2 articulation maintained. Included prevertebral and paraspinal soft tissues are unremarkable.  IMPRESSION: 3 mm acute right temporoparietal subdural hematoma with underlying trace subarachnoid hemorrhage.  Large right cerebral/frontal scalp hematoma without underlying skull fracture.  CT cervical spine: Straightened cervical lordosis  Findings discussed with and reconfirmed by Dr.WILLIAM WALDEN on7/26/2015at11:58 pm.   Electronically Signed   By: Awilda Metro   On: 05/11/2014 23:59   Dg Humerus Right  05/11/2014   CLINICAL DATA:  Fall today, anterior pain to right humerus  EXAM: RIGHT HUMERUS - 2+ VIEW  COMPARISON:  None.  FINDINGS: There is no evidence of fracture or other focal bone lesions. Soft tissues are unremarkable.  IMPRESSION: Negative.   Electronically Signed   By: Esperanza Heir M.D.   On: 05/11/2014 23:24    Chart has been reviewed  Assessment/Plan  73 yo F with hx of a.fib on coumadin here with small subdural hematoma  Present on Admission:  . Subdural hematoma - admit to step down with Neuro surgery consult, repeat CT head in AM, frequent neuro chekcs . Atrial fibrillation- currently well controlled, hold anticoagulation . Diabetes mellitus - hold metformin, SSI . OSA on CPAP - hold CPA while has large facial hematoma   Prophylaxis: SCD    CODE STATUS:  FULL CODE    Other plan as per orders.  I have spent a total of 55 min on this admission  Wael Maestas 05/12/2014, 1:22 AM  [facility]  Pager [phone]   If 7AM-7PM, please contact the day team taking care of the patient  [email or web address]  Password [email or web address]

## 2014-05-12 NOTE — Progress Notes (Signed)
[name] - Stepdown / ICU Progress Note  [name] ZOX:[id number] DOB: 08/16/1941 DOA: 05/11/2014 PCP: Lauren BouillonMILLMAN,FRANKLYN, MD  Brief narrative: [age] year old female patient with history of atrial fibrillation on chronic anticoagulation as well as diabetes. Patient was sent to the ER after experiencing a fall and noticed to be confused. She apparently fell around 9:15 PM on 05/11/2014. She was unable to get up walk back in the house and her family found her laying on the steps. She was confused and complained of being lightheaded and then developed nausea.  After arrival to the ER CT scan demonstrated a 3 mm subdural hematoma. Her PT was elevated at 20.2 with an INR of [name].72. She was reversed with vitamin K intake central. Neurosurgery also evaluated the patient while she was in the emergency department and agreed with the rapid reversal protocol for Coumadin. Repeat CT scan was recommended for the following day and no indication for surgical intervention at time of his evaluation.  HPI/Subjective: Alert and complaining of pain in the occipital region radiating down her neck as well as pain in the left arm. No dizziness or visual disturbances.  Assessment/Plan:    Subdural hematoma, post-traumatic -Followup CT today shows slight enlargement of right subdural hematoma now measuring 4.[name] mm versus prior 3.[name] mm therefore repeat CT scan in the a.m. -Remains neurologically intact -Appreciate neurosurgical assistance    Frontal/supraorbital subcutaneous hematoma -Stable on followup CT    Atrial fibrillation/chronic anticoagulation -Rate control -Coumadin reversed -Await neurosurgical recommendations regarding when safely to resume    Diabetes mellitus -CBGs controlled -Continue sliding scale insulin -Was on metformin at home    OSA on CPAP  DVT prophylaxis: SCDs Code Status: Full Family Communication: Daughter at bed functioned as Nurse, learning disabilitytranslator Disposition Plan/Expected LOS: Step down  - PT/OT evaluations pending  Consultants: Neurosurgery  Procedures: None  Cultures: None  Antibiotics: None  Objective: Blood pressure 98/58, pulse 120, temperature 98.[name] F (36.7 C), temperature source Oral, resp. rate 18, height 4\' 11"  ([name].499 m), weight 115 lb 8.3 oz (52.4 kg), SpO2 97.00%.  Intake/Output Summary (Last 24 hours) at 05/12/14 1345 Last data filed at 05/12/14 0138  Gross per 24 hour  Intake     50 ml  Output      0 ml  Net     50 ml   Exam: Followup exam completed. Patient admitted at 1253 AM today.  Scheduled Meds:  Scheduled Meds: . bacitracin  [name] application Topical BID  . docusate sodium  100 mg Oral BID  . insulin aspart  0-9 Units Subcutaneous 6 times per day  . sodium chloride  3 mL Intravenous Q12H  . sodium chloride  3 mL Intravenous Q12H  . traMADol  50 mg Oral BID   Data Reviewed: Basic Metabolic Panel:  Recent Labs Lab 05/11/14 2211 05/12/14 0336  NA 139 144  K 4.5 4.6  CL 98 102  CO2 24 27  GLUCOSE 203* 152*  BUN 19 16  CREATININE 0.55 0.53  CALCIUM 9.3 9.[name]  MG  --  2.0  PHOS  --  3.4   Liver Function Tests:  Recent Labs Lab 05/12/14 0336  AST 22  ALT 16  ALKPHOS 38*  BILITOT 0.4  PROT 7.[name]  ALBUMIN 4.3   CBC:  Recent Labs Lab 05/11/14 2211 05/12/14 0336  WBC 6.9 12.8*  HGB 13.2 12.4  HCT 39.5 37.6  MCV 94.5 94.5  PLT 312 285   CBG:  Recent Labs Lab 05/12/14 0407  05/12/14 0826 05/12/14 1152  GLUCAP 171* 116* 105*    Recent Results (from the past 240 hour(s))  MRSA PCR SCREENING     Status: None   Collection Time    05/12/14  6:01 AM      Result Value Ref Range Status   MRSA by PCR NEGATIVE  NEGATIVE Final   Comment:            The GeneXpert MRSA Assay (FDA     approved for NASAL specimens     only), is one component of a     comprehensive MRSA colonization     surveillance program. It is not     intended to diagnose MRSA     infection nor to guide or     monitor treatment for     MRSA  infections.     Studies:  Recent x-ray studies have been reviewed in detail by the Attending Physician  Time spent :  25+ mins      Junious Silk, ANP [facility] Office  [phone] Pager [phone]   **If unable to reach the above provider after paging please contact the Flow Manager @ [phone]  On-Call/Text Page:      [email or web address]      password [email or web address]  If 7PM-7AM, please contact night-coverage [email or web address] Password [email or web address] 05/12/2014, [name]:45 PM   LOS: [name] day   I have personally examined this patient and reviewed the entire database. I have reviewed the above note, made any necessary editorial changes, and agree with its content.  Lonia Blood, MD [facility]

## 2014-05-12 NOTE — Progress Notes (Signed)
Patient ID: [name], female   DOB: 08/30/1941, 73 y.o.   MRN: [id number] BP 109/53  Pulse 89  Temp(Src) 98.3 F (36.8 C) (Oral)  Resp 19  Ht 4\' 11"  (1.499 m)  Wt 52.4 kg (115 lb 8.3 oz)  BMI 23.32 kg/m2  SpO2 97% Alert and oriented x 4, following all commands Moving all extremities well Perrl, full eom Symmetric facies, tongue and uvula midline Head CT shows a very small subdural, on the right side causing no mass effect. Will give her something for her stools, possible discharge tomorrow.

## 2014-05-12 NOTE — Consult Note (Signed)
Reason for Consult:traumatic subarachnoid hemorrhage Referring Physician: [name] is an 73 y.o. female.  HPI: whom fell at her home down the front stairs. She told her daughter that she missed a step, but she did come back to the house under her own power. She was confused at the time, but has improved according to her daughter. [name] does not speak english. The fall was ~9pm tonight. The fall was not witnessed. Head CT performed solely due to history of coumadin, and falling. Exam otherwise normal on presentation to the emergency room.   Past Medical History  Diagnosis Date  . OSA (obstructive sleep apnea)   . Atrial fibrillation   . Diabetes mellitus without complication   . Osteoarthritis   . Compression fracture of T12 vertebra     Past Surgical History  Procedure Laterality Date  . Abdominal hysterectomy    . Appendectomy      No family history on file.  Social History:  reports that she has never smoked. She does not have any smokeless tobacco history on file. She reports that she does not drink alcohol or use illicit drugs.  Allergies:  Allergies  Allergen Reactions  . Banana Swelling    Hands and feet  . Chocolate Itching  . Peanut-Containing Drug Products Itching    Throat itches, no swelling    Medications: I have reviewed the patient's current medications.  Results for orders placed during the hospital encounter of 05/11/14 (from the past 48 hour(s))  BASIC METABOLIC PANEL     Status: Abnormal   Collection Time    05/11/14 10:11 PM      Result Value Ref Range   Sodium 139  137 - 147 mEq/L   Potassium 4.5  3.7 - 5.3 mEq/L   Chloride 98  96 - 112 mEq/L   CO2 24  19 - 32 mEq/L   Glucose, Bld 203 (*) 70 - 99 mg/dL   BUN 19  6 - 23 mg/dL   Creatinine, Ser 0.55  0.50 - 1.10 mg/dL   Calcium 9.3  8.4 - 10.5 mg/dL   GFR calc non Af Amer >90  >90 mL/min   GFR calc Af Amer >90  >90 mL/min   Comment: (NOTE)     The eGFR has been calculated using the CKD  EPI equation.     This calculation has not been validated in all clinical situations.     eGFR's persistently <90 mL/min signify possible Chronic Kidney     Disease.   Anion gap 17 (*) 5 - 15  CBC     Status: None   Collection Time    05/11/14 10:11 PM      Result Value Ref Range   WBC 6.9  4.0 - 10.5 K/uL   RBC 4.18  3.87 - 5.11 MIL/uL   Hemoglobin 13.2  12.0 - 15.0 g/dL   HCT 39.5  36.0 - 46.0 %   MCV 94.5  78.0 - 100.0 fL   MCH 31.6  26.0 - 34.0 pg   MCHC 33.4  30.0 - 36.0 g/dL   RDW 12.2  11.5 - 15.5 %   Platelets 312  150 - 400 K/uL  PROTIME-INR     Status: Abnormal   Collection Time    05/11/14 10:11 PM      Result Value Ref Range   Prothrombin Time 20.2 (*) 11.6 - 15.2 seconds   INR 1.72 (*) 0.00 - 1.49  I-STAT TROPOININ, ED  Status: None   Collection Time    05/11/14 10:16 PM      Result Value Ref Range   Troponin i, poc 0.00  0.00 - 0.08 ng/mL   Comment 3            Comment: Due to the release kinetics of cTnI,     a negative result within the first hours     of the onset of symptoms does not rule out     myocardial infarction with certainty.     If myocardial infarction is still suspected,     repeat the test at appropriate intervals.    Dg Thoracic Spine 2 View  05/11/2014   CLINICAL DATA:  [place] today with back pain  EXAM: THORACIC SPINE - 2 VIEW  COMPARISON:  01/18/2008  FINDINGS: Mild to moderate sigmoid scoliotic curvature the thoracolumbar spine. There is a 50% compression fracture at T12. T12 is moderately sclerotic with osteophytes at T11-12 and T12-L1. There is moderate multilevel spondylosis with no other fractures.  IMPRESSION: Known moderate T9 compression deformity. No other abnormalities. This fracture is new from 2009.   Electronically Signed   By: Skipper Cliche M.D.   On: 05/11/2014 23:23   Dg Lumbar Spine Complete  05/11/2014   CLINICAL DATA:  Mid upper back pain secondary to a fall today. History of T12 fracture.  EXAM: LUMBAR SPINE - COMPLETE  4+ VIEW  COMPARISON:  CT scan of the abdomen dated 01/18/2008  FINDINGS: There is an old severe compression fracture of the T12 vertebral body with loss of 75% of the anterior vertebral body height. This accentuates the thoracic kyphosis.  Lumbar spine is diffusely osteopenic but there is no disc space narrowing or subluxation. There are slight degenerative changes of the facet joints at L4-5 and L5-S1 on the right.  IMPRESSION: No acute abnormalities of the lumbar spine. Old compression fracture of T12.   Electronically Signed   By: Rozetta Nunnery M.D.   On: 05/11/2014 23:26   Ct Head Wo Contrast  05/11/2014   CLINICAL DATA:  Fall, head trauma, on Coumadin.  EXAM: CT HEAD WITHOUT CONTRAST  CT CERVICAL SPINE WITHOUT CONTRAST  TECHNIQUE: Multidetector CT imaging of the head and cervical spine was performed following the standard protocol without intravenous contrast. Multiplanar CT image reconstructions of the cervical spine were also generated.  COMPARISON:  None.  FINDINGS: CT HEAD FINDINGS  The ventricles and sulci are normal for age. No intraparenchymal hemorrhage, mass effect nor midline shift. Patchy supratentorial white matter hypodensities are within normal range for patient's age and though non-specific suggest sequelae of chronic small vessel ischemic disease. Symmetric basal ganglia mineralization. No acute large vascular territory infarcts.  3 mm extensive right temporal parietal extra-axial fluid collections. Trace right temporal parietal subarachnoid blood. Basal cisterns are patent. Moderate calcific atherosclerosis of the carotid siphons.  Large right periorbital/frontal scalp hematoma without subcutaneous gas or radiopaque foreign bodies. No skull fracture. The included ocular globes and orbital contents are non-suspicious. Status post bilateral ocular lens implants. The mastoid aircells and included paranasal sinuses are well-aerated.  CT CERVICAL SPINE FINDINGS  Cervical vertebral bodies and  posterior elements are intact and aligned with straightened cervical lordosis. Intervertebral disc heights preserved. No destructive bony lesions. C1-2 articulation maintained. Included prevertebral and paraspinal soft tissues are unremarkable.  IMPRESSION: 3 mm acute right temporoparietal subdural hematoma with underlying trace subarachnoid hemorrhage.  Large right cerebral/frontal scalp hematoma without underlying skull fracture.  CT cervical spine: Straightened cervical  lordosis  Findings discussed with and reconfirmed by Dr.WILLIAM WALDEN on7/26/2015at11:58 pm.   Electronically Signed   By: Elon Alas   On: 05/11/2014 23:59   Ct Cervical Spine Wo Contrast  05/11/2014   CLINICAL DATA:  Fall, head trauma, on Coumadin.  EXAM: CT HEAD WITHOUT CONTRAST  CT CERVICAL SPINE WITHOUT CONTRAST  TECHNIQUE: Multidetector CT imaging of the head and cervical spine was performed following the standard protocol without intravenous contrast. Multiplanar CT image reconstructions of the cervical spine were also generated.  COMPARISON:  None.  FINDINGS: CT HEAD FINDINGS  The ventricles and sulci are normal for age. No intraparenchymal hemorrhage, mass effect nor midline shift. Patchy supratentorial white matter hypodensities are within normal range for patient's age and though non-specific suggest sequelae of chronic small vessel ischemic disease. Symmetric basal ganglia mineralization. No acute large vascular territory infarcts.  3 mm extensive right temporal parietal extra-axial fluid collections. Trace right temporal parietal subarachnoid blood. Basal cisterns are patent. Moderate calcific atherosclerosis of the carotid siphons.  Large right periorbital/frontal scalp hematoma without subcutaneous gas or radiopaque foreign bodies. No skull fracture. The included ocular globes and orbital contents are non-suspicious. Status post bilateral ocular lens implants. The mastoid aircells and included paranasal sinuses are  well-aerated.  CT CERVICAL SPINE FINDINGS  Cervical vertebral bodies and posterior elements are intact and aligned with straightened cervical lordosis. Intervertebral disc heights preserved. No destructive bony lesions. C1-2 articulation maintained. Included prevertebral and paraspinal soft tissues are unremarkable.  IMPRESSION: 3 mm acute right temporoparietal subdural hematoma with underlying trace subarachnoid hemorrhage.  Large right cerebral/frontal scalp hematoma without underlying skull fracture.  CT cervical spine: Straightened cervical lordosis  Findings discussed with and reconfirmed by Dr.WILLIAM WALDEN on7/26/2015at11:58 pm.   Electronically Signed   By: Elon Alas   On: 05/11/2014 23:59   Dg Humerus Right  05/11/2014   CLINICAL DATA:  Fall today, anterior pain to right humerus  EXAM: RIGHT HUMERUS - 2+ VIEW  COMPARISON:  None.  FINDINGS: There is no evidence of fracture or other focal bone lesions. Soft tissues are unremarkable.  IMPRESSION: Negative.   Electronically Signed   By: Skipper Cliche M.D.   On: 05/11/2014 23:24    Review of Systems  Constitutional: Negative.   HENT: Negative.   Eyes: Negative.   Respiratory: Negative.   Cardiovascular: Negative.   Gastrointestinal: Negative.   Musculoskeletal: Positive for falls.  Skin: Negative.   Neurological:       Headache today after fall Peripheral neuropathy  Endo/Heme/Allergies:       Diabetes  Psychiatric/Behavioral: Negative.    Blood pressure 140/80, pulse 100, temperature 98 F (36.7 C), temperature source Oral, resp. rate 16, height $RemoveBe'4\' 10"'QXbtGURPr$  (1.473 m), weight 54.432 kg (120 lb), SpO2 97.00%. Physical Exam  Constitutional: She is oriented to person, place, and time. She appears well-developed and well-nourished.  HENT:  Right forehead contusion, edema, abrasion  Eyes:  Pupils small irregular, History cataract surgery bilaterally Briskly reactive  Neck: Normal range of motion.  Cardiovascular: Intact distal  pulses.   Irregularly irregular  Respiratory: Effort normal and breath sounds normal.  GI: Soft.  Musculoskeletal:  Contusion right biceps, ecchymotic  Neurological: She is alert and oriented to person, place, and time. She has normal strength. No cranial nerve deficit. She exhibits normal muscle tone. Coordination normal. She displays no Babinski's sign on the right side. She displays no Babinski's sign on the left side.  Reflex Scores:      Patellar reflexes  are 1+ on the right side and 2+ on the left side.      Achilles reflexes are 1+ on the right side and 1+ on the left side. Daughter believes she is slower than usual, better than immediately after fall Following commands, moving all extremities Symmetric facies, tongue and uvula midline Light touch intact Gait not assessed  Skin: Skin is warm and dry.  Psychiatric: She has a normal mood and affect.    Assessment/Plan: 73 yo with traumatic subarachnoid hemorrhage. Following all commands. Dr. Mingo Amber has ordered the rapid reversal protocol for the coumadin. Her cardiologist is located at [facility]. Will need a repeat CT scan tomorrow. Do not anticipate need for any type of surgery. Also complained of some abdominal pain, though I did not appreciate during my exam. Will follow.  [name] L 05/12/2014, 12:53 AM

## 2014-05-12 NOTE — Progress Notes (Signed)
Utilization review completed.  

## 2014-05-12 NOTE — ED Notes (Signed)
Admitting MD at bedside.

## 2014-05-12 NOTE — ED Notes (Signed)
MD at bedside. 

## 2014-05-12 NOTE — Progress Notes (Signed)
MEDICATION RELATED CONSULT NOTE - INITIAL   [facility]  Indication: Warfarin Reversal   Allergies  Allergen Reactions  . Banana Swelling    Hands and feet  . Chocolate Itching  . Peanut-Containing Drug Products Itching    Throat itches, no swelling    Patient Measurements: Height: 4\' 11"  (149.9 cm) Weight: 115 lb 8.3 oz (52.4 kg) IBW/kg (Calculated) : 43.2  Vital Signs: Temp: 98.1 F (36.7 C) (07/27 0409) Temp src: Oral (07/27 0409) BP: 101/50 mmHg (07/27 0409) Pulse Rate: 98 (07/27 0409) Intake/Output from previous day: 07/26 0701 - 07/27 0700 In: 50 [I.V.:50] Out: -  Intake/Output from this shift: Total I/O In: 50 [I.V.:50] Out: -   Labs:  Recent Labs  05/11/14 2211 05/12/14 0336  WBC 6.9 12.8*  HGB 13.2 12.4  HCT 39.5 37.6  PLT 312 285  CREATININE 0.55 0.53  MG  --  2.0  PHOS  --  3.4  ALBUMIN  --  4.3  PROT  --  7.1  AST  --  22  ALT  --  16  ALKPHOS  --  38*  BILITOT  --  0.4   Estimated Creatinine Clearance: 47.1 ml/min (by C-G formula based on Cr of 0.53).   Microbiology: No results found for this or any previous visit (from the past 720 hour(s)).  Medical History: Past Medical History  Diagnosis Date  . OSA (obstructive sleep apnea)   . Atrial fibrillation   . Diabetes mellitus without complication   . Osteoarthritis   . Compression fracture of T12 vertebra   . T12 compression fracture     Assessment: 73 y/o F on warfarin PTA s/p fall. Received 25 units/kg [facility]. 60 min post [facility] INR is 1.10  Plan:  INR q6h x 4, then daily x 2  Abran DukeLedford, Sanora Cunanan 05/12/2014,4:27 AM

## 2014-05-13 ENCOUNTER — Inpatient Hospital Stay (HOSPITAL_COMMUNITY): Payer: Medicare Other

## 2014-05-13 DIAGNOSIS — Z5189 Encounter for other specified aftercare: Secondary | ICD-10-CM

## 2014-05-13 DIAGNOSIS — G4733 Obstructive sleep apnea (adult) (pediatric): Secondary | ICD-10-CM

## 2014-05-13 DIAGNOSIS — W19XXXA Unspecified fall, initial encounter: Secondary | ICD-10-CM

## 2014-05-13 LAB — CBC
HCT: 34.5 % — ABNORMAL LOW (ref 36.0–46.0)
HEMOGLOBIN: 11.3 g/dL — AB (ref 12.0–15.0)
MCH: 31.2 pg (ref 26.0–34.0)
MCHC: 32.8 g/dL (ref 30.0–36.0)
MCV: 95.3 fL (ref 78.0–100.0)
Platelets: 264 10*3/uL (ref 150–400)
RBC: 3.62 MIL/uL — AB (ref 3.87–5.11)
RDW: 12.5 % (ref 11.5–15.5)
WBC: 6.8 10*3/uL (ref 4.0–10.5)

## 2014-05-13 LAB — BASIC METABOLIC PANEL
ANION GAP: 12 (ref 5–15)
BUN: 13 mg/dL (ref 6–23)
CHLORIDE: 104 meq/L (ref 96–112)
CO2: 24 mEq/L (ref 19–32)
CREATININE: 0.59 mg/dL (ref 0.50–1.10)
Calcium: 8.6 mg/dL (ref 8.4–10.5)
GFR calc Af Amer: >90 mL/min (ref >90)
GFR calc non Af Amer: 89 mL/min — ABNORMAL LOW (ref >90)
Glucose, Bld: 123 mg/dL — ABNORMAL HIGH (ref 70–99)
POTASSIUM: 4.4 meq/L (ref 3.7–5.3)
Sodium: 140 mEq/L (ref 137–147)

## 2014-05-13 LAB — GLUCOSE, CAPILLARY
GLUCOSE-CAPILLARY: 109 mg/dL — AB (ref 70–99)
GLUCOSE-CAPILLARY: 140 mg/dL — AB (ref 70–99)
Glucose-Capillary: 116 mg/dL — ABNORMAL HIGH (ref 70–99)
Glucose-Capillary: 116 mg/dL — ABNORMAL HIGH (ref 70–99)
Glucose-Capillary: 117 mg/dL — ABNORMAL HIGH (ref 70–99)

## 2014-05-13 LAB — PROTIME-INR
INR: 1.08 (ref 0.00–1.49)
PROTHROMBIN TIME: 14 s (ref 11.6–15.2)

## 2014-05-13 NOTE — Evaluation (Signed)
Physical Therapy Evaluation Patient Details Name: [name] MRN: [id number] DOB: 03/31/1941 Today's Date: 05/13/2014   History of Present Illness  73 yo female s/p fall with resultant SDH.  Clinical Impression  Patient demonstrates deficits in functional mobility as indicated below Will benefit from continued skilled PT to address deficits and maximize function. Will see as indicated and progress as tolerated. Recommend outpatient PT follow up.    Follow Up Recommendations Outpatient PT;Supervision for mobility/OOB    Equipment Recommendations  None recommended by PT    Recommendations for Other Services       Precautions / Restrictions Precautions Precautions: Fall Restrictions Weight Bearing Restrictions: No      Mobility  Bed Mobility Overal bed mobility: Modified Independent                Transfers Overall transfer level: Modified independent                  Ambulation/Gait Ambulation/Gait assistance: Supervision Ambulation Distance (Feet): 240 Feet Assistive device: None Gait Pattern/deviations: Antalgic;Decreased stride length;Narrow base of support;Drifts right/left Gait velocity: decreased Gait velocity interpretation: Below normal speed for age/gender General Gait Details: some instability noted with ambulation, has baseline LLE weakness and pain.   Stairs Stairs: Yes Stairs assistance: Min guard Stair Management: One rail Left;Forwards;Step to pattern Number of Stairs: 12 General stair comments: VCs for sequencing with painful LLE  Wheelchair Mobility    Modified Rankin (Stroke Patients Only)       Balance Overall balance assessment: History of Falls                                           Pertinent Vitals/Pain VSS, pain on right side and face    Home Living Family/patient expects to be discharged to:: Private residence Living Arrangements: Children Available Help at Discharge: Family Type of Home:  House Home Access: Stairs to enter Entrance Stairs-Rails: None Secretary/administratorntrance Stairs-Number of Steps: 2 Home Layout: Two level Home Equipment: None      Prior Function Level of Independence: Independent               Hand Dominance   Dominant Hand: Right    Extremity/Trunk Assessment               Lower Extremity Assessment: LLE deficits/detail   LLE Deficits / Details: pt with hx of left LE weakness and knee pain/buckling     Communication   Communication: Prefers language other than English  Cognition Arousal/Alertness: Awake/alert Behavior During Therapy: WFL for tasks assessed/performed Overall Cognitive Status: Difficult to assess                      General Comments General comments (skin integrity, edema, etc.): patient with abrashions on right face, bruising on right forehead and eye, scrapes right side    Exercises        Assessment/Plan    PT Assessment Patient needs continued PT services  PT Diagnosis Difficulty walking;Abnormality of gait;Generalized weakness;Acute pain   PT Problem List Decreased strength;Decreased activity tolerance;Decreased balance;Decreased mobility;Decreased coordination  PT Treatment Interventions DME instruction;Gait training;Stair training;Functional mobility training;Therapeutic activities;Therapeutic exercise;Patient/family education   PT Goals (Current goals can be found in the Care Plan section) Acute Rehab PT Goals Patient Stated Goal: to go home PT Goal Formulation: With patient/family Time For Goal Achievement: 05/27/14 Potential to Achieve Goals:  Good    Frequency     Barriers to discharge        Co-evaluation               End of Session Equipment Utilized During Treatment: Gait belt Activity Tolerance: Patient tolerated treatment well Patient left: in bed;with call bell/phone within reach;with family/visitor present Nurse Communication: Mobility status         Time: 1610-9604 PT  Time Calculation (min): 27 min   Charges:   PT Evaluation $Initial PT Evaluation Tier I: 1 Procedure PT Treatments $Gait Training: 8-22 mins $Self Care/Home Management: 8-22   PT G CodesFabio Asa 05/13/2014, 9:10 AM Charlotte Crumb, PT DPT  [phone]

## 2014-05-13 NOTE — Progress Notes (Signed)
[name]  [name] VOZ:[id number] DOB: 1940/12/05 DOA: 05/11/2014 PCP: [name]  Brief narrative: 73 year old female patient with history of atrial fibrillation on chronic anticoagulation as well as diabetes. Patient was sent to the ER after experiencing a fall and noticed to be confused. She apparently fell around 9:15 PM on 05/11/2014. She was unable to get up walk back in the house and her family found her laying on the steps. She was confused and complained of being lightheaded and then developed nausea.  After arrival to the ER CT scan demonstrated a 3 mm subdural hematoma. Her PT was elevated at 20.2 with an INR of 1.72. She was reversed with vitamin K intake central. Neurosurgery also evaluated the patient while she was in the emergency department and agreed with the rapid reversal protocol for Coumadin. Repeat CT scan was recommended for the following day and no indication for surgical intervention at time of his evaluation.  HPI/Subjective: Alert and without complaints  Assessment/Plan:    Subdural hematoma, post-traumatic -CT's showed right subdural hematoma initially measuring 3.1 mm then up to 4.1 mm with repeat on 7/28 down to 2 mm -Remains neurologically intact -Spoke with Dr. Coletta [name] (neurosurgery) and he will reevaluate patient in 2 weeks with another head CT. Patient will have to stay off anticoagulations for a minimum 30-90 days. Per neurosurgery since patient has demonstrated being a fall risk, strong possibility they will recommend NO FUTURE anticoagulation.  -Spoke with daughter and explained to her that patient would be off anticoagulations a minimum of 30-90 days. -Discharge in a.m.    Frontal/supraorbital subcutaneous hematoma -Stable on followup CT    Atrial fibrillation/chronic anticoagulation -Rate control -Coumadin reversed -See subdural hematoma     Diabetes mellitus -CBGs controlled -Continue  sliding scale insulin -Was on metformin at home    OSA on CPAP    DVT prophylaxis: SCDs Code Status: Full Family Communication: Daughter at bed functioned as Nurse, learning disability Disposition Plan/Expected LOS: Transfer to floor- PT rec OP PT-suspect will be ready for dc 7/29  Consultants:  Dr. Coletta [name] (neurosurgery)   Procedures: None  Cultures: None  Antibiotics: None  Objective: Blood pressure 111/62, pulse 93, temperature 99.1 F (37.3 C), temperature source Oral, resp. rate 18, height 4\' 11"  (1.499 m), weight 115 lb 8.3 oz (52.4 kg), SpO2 99.00%.  Intake/Output Summary (Last 24 hours) at 05/13/14 1501 Last data filed at 05/13/14 1300  Gross per 24 hour  Intake    240 ml  Output      0 ml  Net    240 ml   Exam: Gen: No acute respiratory distress ENT: small contusion right supraorbital area Chest: Clear to auscultation bilaterally without wheezes, rhonchi or crackles, room air Cardiac: Regular rate and rhythm, S1-S2, no rubs murmurs or gallops, no peripheral edema, no JVD Abdomen: Soft nontender nondistended without obvious hepatosplenomegaly, no ascites Extremities: Symmetrical in appearance without cyanosis, clubbing or effusion Neurological: Alert and oriented, no focal deficits  Scheduled Meds:  Scheduled Meds: . bacitracin  1 application Topical BID  . docusate sodium  100 mg Oral BID  . insulin aspart  0-9 Units Subcutaneous TID WC  . traMADol  50 mg Oral BID   Data Reviewed: Basic Metabolic Panel:  Recent Labs Lab 05/11/14 2211 05/12/14 0336 05/13/14 0300  NA 139 144 140  K 4.5 4.6 4.4  CL 98 102 104  CO2 24 27 24   GLUCOSE 203* 152* 123*  BUN 19 16 13   CREATININE 0.55 0.53 0.59  CALCIUM 9.3 9.1 8.6  MG  --  2.0  --   PHOS  --  3.4  --    Liver Function Tests:  Recent Labs Lab 05/12/14 0336  AST 22  ALT 16  ALKPHOS 38*  BILITOT 0.4  PROT 7.1  ALBUMIN 4.3   CBC:  Recent Labs Lab 05/11/14 2211 05/12/14 0336 05/13/14 0300  WBC  6.9 12.8* 6.8  HGB 13.2 12.4 11.3*  HCT 39.5 37.6 34.5*  MCV 94.5 94.5 95.3  PLT 312 285 264   CBG:  Recent Labs Lab 05/12/14 1556 05/12/14 2027 05/13/14 0759 05/13/14 1145 05/13/14 1245  GLUCAP 105* 121* 116* 116* 109*    Recent Results (from the past 240 hour(s))  MRSA PCR SCREENING     Status: None   Collection Time    05/12/14  6:01 AM      Result Value Ref Range Status   MRSA by PCR NEGATIVE  NEGATIVE Final   Comment:            The GeneXpert MRSA Assay (FDA     approved for NASAL specimens     only), is one component of a     comprehensive MRSA colonization     surveillance program. It is not     intended to diagnose MRSA     infection nor to guide or     monitor treatment for     MRSA infections.     Studies:  Recent x-ray studies have been reviewed in detail by the Attending Physician  Time spent :  25+ mins      [name], ANP [facility] Office  [phone] Pager [phone]   **If unable to reach the above provider after paging please contact the Flow Manager @ [phone]  On-Call/Text Page:      [name]      password [email or web address]  If 7PM-7AM, please contact night-coverage [email or web address] Password [email or web address] 05/13/2014, 3:01 PM   LOS: 2 days   Examined patient with ANP Lauren StandardAllison and agree with above assessment and plan. Spoke with daughter and explained to her that patient would have to stay off anticoagulation a minimum of 30-90 days. Answered all questions to include that patient would be at increased risk of SDH in the future. Patient with multiple complex medical problems> 40 minutes spent in direct patient care

## 2014-05-13 NOTE — Progress Notes (Signed)
Pt transferred from 3 S alert and oriented to person place and time and situation through interpretation in [place] by daughter. Initial assessment done, Vs stable . No acute distress at present . RN will continue to monitor

## 2014-05-13 NOTE — Progress Notes (Signed)
Occupational Therapy Evaluation Patient Details Name: [name] MRN: [id number] DOB: [birth year] Today's Date: 05/13/2014    History of Present Illness 73 yo female s/p fall with resultant SDH.   Clinical Impression   Patient presents to OT with decreased ADL independence and safety. Will benefit from skilled OT to maximize independence and facilitate safe discharge.    Follow Up Recommendations  Supervision/Assistance - 24 hour;Home health OT    Equipment Recommendations  Other (comment) (tbd)    Recommendations for Other Services       Precautions / Restrictions Precautions Precautions: Fall Restrictions Weight Bearing Restrictions: No      Mobility Bed Mobility Overal bed mobility: Modified Independent                Transfers                      Balance                                            ADL Overall ADL's : Needs assistance/impaired     Grooming: Wash/dry hands;Min guard;Standing   Upper Body Bathing: Set up;Sitting   Lower Body Bathing: Minimal assistance;Sit to/from stand           Toilet Transfer: Min guard;Ambulation;Comfort height toilet   Toileting- Clothing Manipulation and Hygiene: Min guard;Sit to/from stand       Functional mobility during ADLs: Min guard;Rolling walker General ADL Comments: Patient ambulated to/from bathroom with RW and min guard A. Stood at sink for grooming ADLs and simulated bathing task. No family present and pt non-English speaking.     Vision                     Perception     Praxis      Pertinent Vitals/Pain No c/o pain     Hand Dominance Right   Extremity/Trunk Assessment Upper Extremity Assessment Upper Extremity Assessment: Overall WFL for tasks assessed   Lower Extremity Assessment Lower Extremity Assessment: Defer to PT evaluation       Communication Communication Communication: Prefers language other than English   Cognition  Arousal/Alertness: Awake/alert Behavior During Therapy: WFL for tasks assessed/performed Overall Cognitive Status: Difficult to assess                     General Comments       Exercises       Shoulder Instructions      Home Living Family/patient expects to be discharged to:: Private residence Living Arrangements: Children Available Help at Discharge: Family Type of Home: House Home Access: Stairs to enter Secretary/administrator of Steps: 2 Entrance Stairs-Rails: None Home Layout: Two level Alternate Level Stairs-Number of Steps: 12 Alternate Level Stairs-Rails: Right Bathroom Shower/Tub: Other (comment) (no family present and pt does not speak [place])   Firefighter:  (no family present and pt does not speak [place])         Additional Comments: need further information re: bathroom setup      Prior Functioning/Environment Level of Independence: Independent             OT Diagnosis: Generalized weakness   OT Problem List: Decreased strength;Decreased activity tolerance;Decreased safety awareness   OT Treatment/Interventions: Self-care/ADL training;Therapeutic exercise;DME and/or AE instruction;Therapeutic activities;Patient/family education    OT Goals(Current goals can be found in  the care plan section) Acute Rehab OT Goals OT Goal Formulation: Patient unable to participate in goal setting Time For Goal Achievement: 05/27/14 Potential to Achieve Goals: Good  OT Frequency: Min 2X/week   Barriers to D/C:            Co-evaluation              End of Session Equipment Utilized During Treatment: Rolling walker  Activity Tolerance: Patient tolerated treatment well Patient left: in bed;with call bell/phone within reach;with bed alarm set   Time: [id number] OT Time Calculation (min): 13 min Charges:  OT General Charges $OT Visit: 1 Procedure OT Evaluation $Initial OT Evaluation Tier I: 1 Procedure OT Treatments $Self Care/Home  Management : 8-22 mins G-Codes:    [name] A 05/13/2014, 3:41 PM

## 2014-05-13 NOTE — Progress Notes (Signed)
Patient ID: [name], female   DOB: 12/30/1940, 73 y.o.   MRN: [id number] BP 111/62  Pulse 93  Temp(Src) 99.1 F (37.3 C) (Oral)  Resp 18  Ht 4\' 11"  (1.499 m)  Wt 52.4 kg (115 lb 8.3 oz)  BMI 23.32 kg/m2  SpO2 99% Alert and oriented x 4, speech is clear, fluent Perrl, full eom Moving all extremities well Symmetric facies Looks much better today. Ok for discharge from my standpoint. Will see in two weeks for followup.  Scan is improved, again ok for discharge.

## 2014-05-14 LAB — GLUCOSE, CAPILLARY
GLUCOSE-CAPILLARY: 127 mg/dL — AB (ref 70–99)
GLUCOSE-CAPILLARY: 100 mg/dL — AB (ref 70–99)

## 2014-05-14 LAB — PROTIME-INR
INR: 1.04 (ref 0.00–1.49)
PROTHROMBIN TIME: 13.6 s (ref 11.6–15.2)

## 2014-05-14 MED ORDER — BACITRACIN ZINC 500 UNIT/GM EX OINT: 1.0000 "application " | TOPICAL_OINTMENT | Freq: Two times a day (BID) | CUTANEOUS | Status: DC

## 2014-05-14 MED ORDER — DSS 100 MG PO CAPS: 100.0000 mg | ORAL_CAPSULE | Freq: Two times a day (BID) | ORAL | Status: DC

## 2014-05-14 MED ORDER — TRAMADOL HCL 50 MG PO TABS: 50.0000 mg | ORAL_TABLET | Freq: Two times a day (BID) | ORAL | Status: DC

## 2014-05-14 MED ORDER — MAGNESIUM CITRATE PO SOLN
0.5000 | Freq: Once | ORAL | Status: AC
  Administered 2014-05-14: 0.5 via ORAL
  Filled 2014-05-14 (×2): qty 296

## 2014-05-14 MED ORDER — ONDANSETRON HCL 4 MG PO TABS: 4.0000 mg | ORAL_TABLET | Freq: Three times a day (TID) | ORAL | Status: DC | PRN

## 2014-05-14 MED ORDER — HYDROCOD POLST-CHLORPHEN POLST 10-8 MG/5ML PO LQCR: 5.0000 mL | Freq: Two times a day (BID) | ORAL | Status: DC | PRN

## 2014-05-14 NOTE — Care Management Note (Addendum)
  Page 1 of 1   05/14/2014     9:59:37 AM CARE MANAGEMENT NOTE 05/14/2014  Patient:  [name], [name]   Account Number:  [id number]  Date Initiated:  05/12/2014  Documentation initiated by:  Marvetta Gibbons  Subjective/Objective Assessment:   Pt admitted s/p fall with SDH     Action/Plan:   PTA pt lived at home with family   Anticipated DC Date:  05/14/2014   Anticipated DC Plan:  [place]  CM consult      Choice offered to / List presented to:  C-4 Adult Children        HH arranged  HH-2 PT      [place].   Status of service:  Completed, signed off Medicare Important Message given?  YES (If response is "NO", the following Medicare IM given date fields will be blank) Date Medicare IM given:  05/14/2014 Medicare IM given by:  Lorne Skeens Date Additional Medicare IM given:   Additional Medicare IM given by:    Discharge Disposition:    Per UR Regulation:  Reviewed for med. necessity/level of care/duration of stay  If discussed at [place] of Stay Meetings, dates discussed:    Comments:  05/14/14 [place] RN, MSN, CM- Met with patient and daughter to discuss home health PT. Patient lives with daughter, who acted as Optometrist.  Patient is agreeable to HHPT, and daughter has chosen [facility]. Marie with [facility] was notified and has accepted the referral for probable discharge home today. Patient's contact is daughter [name] (same first name as patient) and address/phone number are correct in the chart.

## 2014-05-14 NOTE — Discharge Instructions (Signed)
Fall Prevention and Home Safety Falls cause injuries and can affect all age groups. It is possible to use preventive measures to significantly decrease the likelihood of falls. There are many simple measures which can make your home safer and prevent falls. OUTDOORS  Repair cracks and edges of walkways and driveways.  Remove high doorway thresholds.  Trim shrubbery on the main path into your home.  Have good outside lighting.  Clear walkways of tools, rocks, debris, and clutter.  Check that handrails are not broken and are securely fastened. Both sides of steps should have handrails.  Have leaves, snow, and ice cleared regularly.  Use sand or salt on walkways during winter months.  In the garage, clean up grease or oil spills. BATHROOM  Install night lights.  Install grab bars by the toilet and in the tub and shower.  Use non-skid mats or decals in the tub or shower.  Place a plastic non-slip stool in the shower to sit on, if needed.  Keep floors dry and clean up all water on the floor immediately.  Remove soap buildup in the tub or shower on a regular basis.  Secure bath mats with non-slip, double-sided rug tape.  Remove throw rugs and tripping hazards from the floors. BEDROOMS  Install night lights.  Make sure a bedside light is easy to reach.  Do not use oversized bedding.  Keep a telephone by your bedside.  Have a firm chair with side arms to use for getting dressed.  Remove throw rugs and tripping hazards from the floor. KITCHEN  Keep handles on pots and pans turned toward the center of the stove. Use back burners when possible.  Clean up spills quickly and allow time for drying.  Avoid walking on wet floors.  Avoid hot utensils and knives.  Position shelves so they are not too high or low.  Place commonly used objects within easy reach.  If necessary, use a sturdy step stool with a grab bar when reaching.  Keep electrical cables out of the  way.  Do not use floor polish or wax that makes floors slippery. If you must use wax, use non-skid floor wax.  Remove throw rugs and tripping hazards from the floor. STAIRWAYS  Never leave objects on stairs.  Place handrails on both sides of stairways and use them. Fix any loose handrails. Make sure handrails on both sides of the stairways are as long as the stairs.  Check carpeting to make sure it is firmly attached along stairs. Make repairs to worn or loose carpet promptly.  Avoid placing throw rugs at the top or bottom of stairways, or properly secure the rug with carpet tape to prevent slippage. Get rid of throw rugs, if possible.  Have an electrician put in a light switch at the top and bottom of the stairs. OTHER FALL PREVENTION TIPS  Wear low-heel or rubber-soled shoes that are supportive and fit well. Wear closed toe shoes.  When using a stepladder, make sure it is fully opened and both spreaders are firmly locked. Do not climb a closed stepladder.  Add color or contrast paint or tape to grab bars and handrails in your home. Place contrasting color strips on first and last steps.  Learn and use mobility aids as needed. Install an electrical emergency response system.  Turn on lights to avoid dark areas. Replace light bulbs that burn out immediately. Get light switches that glow.  Arrange furniture to create clear pathways. Keep furniture in the same place.  Firmly attach carpet with non-skid or double-sided tape.  Eliminate uneven floor surfaces.  Select a carpet pattern that does not visually hide the edge of steps.  Be aware of all pets. OTHER HOME SAFETY TIPS  Set the water temperature for 120 F (48.8 C).  Keep emergency numbers on or near the telephone.  Keep smoke detectors on every level of the home and near sleeping areas. Document Released: 09/23/2002 Document Revised: 04/03/2012 Document Reviewed: 12/23/2011 [facility] Patient Information 2015  [place], [place]. This information is not intended to replace advice given to you by your health care provider. Make sure you discuss any questions you have with your health care provider. ??ng d?p (Contusion) ??ng d?p l v?t thm tm su. ??ng d?p l h?u qu? c?a ch?n th??ng gy ch?y mu d??i da. ??ng d?p c th? chuy?n thnh mu xanh, tm ho?c vng. Ch?n th??ng nh? s? ?? l?i v?t ??ng d?p khng ?au, nh?ng nh?ng ??ng d?p nghim tr?ng h?n c th? gy ?au ??n v s?ng m?t vi tu?n.  NGUYN NHN  ??ng d?p th??ng do m?t c ?nh, ch?n th??ng ho?c l?c tc ??ng tr?c ti?p ln m?t vng c?a c? th? gy ra. TRI?U CH?NG   S?ng v t?y ?? vng b? th??ng.  Thm tm vng b? th??ng.  Nh?y c?m ?au v ?au nh?c vng b? th??ng.  ?au. CH?N ?ON  Ch?n ?on c th? ???c ??a ra b?ng cch ki?m tra ti?n s? v khm th?c th?. C th? c?n ch?p X-quang, CT ho?c MRI ?? xc ??nh xem c b?t k? ch?n th??ng no lin quan, ch?ng h?n nh? gy x??ng. ?I?U TR?  ?i?u tr? c? th? s? ph? thu?c vo vng c? th? b? th??ng. Ni chung, bi?n php ?i?u tr? ??ng d?p t?t nh?t l ngh? ng?i, ch??m ?, nng cao v ch??m l?nh vng b? th??ng. Thu?c khng c?n k ??n c?ng c th? ???c khuyn dng ?? ki?m sot c?n ?au. H?i chuyn gia ch?m [place] s?c kh?e cch ?i?u tr? t?t nh?t cho ch? ??ng d?p c?a qu v?. H??NG D?N CH?M [place] T?I NH   Ch??m ? l?nh ln vng b? th??ng.  Cho ? l?nh vo ti nh?a.  ?? kh?n t?m vo gi?a da v ti.  ?? ? l?nh trong kho?ng 15-20 pht, 3-4 l?n m?i ngy, ho?c theo ch? d?n c?a chuyn gia ch?m [place] s?c kh?e c?a qu v?.  Ch? s? d?ng thu?c khng c?n k ??n ho?c thu?c c?n k ??n ?? gi?m ?au, gi?m c?m gic kh ch?u ho?c h? s?t theo ch? d?n c?a chuyn gia ch?m [place] s?c kh?e c?a qu v?. Chuyn gia ch?m  s?c kh?e c th? [place] qu v? trnh s? d?ng cc thu?c ch?ng vim (aspirin, ibuprofen v naproxen) trong 48 gi? v nh?ng thu?c ny c th? lm thm tm t?ng ln.  ?? vng b? th??ng ngh? ng?i.  N?u c th?, hy nng cao vng b? th??ng ?? gi?m  s?ng. NGAY L?P T?C ?I KHM N?U:   Qu v? b? thm tm ho?c s?ng t?ng ln.  Qu v? b? ?au ngy cng nhi?u.  S?ng hay ?au nh?c khng thuyn gi?m sau khi dng thu?c. ??M B?O QU V?:   Hi?u r cc h??ng d?n ny.  S? theo di tnh tr?ng c?a mnh.  S? yu c?u tr? gip ngay l?p t?c n?u qu v? c?m th?y khng kh?e ho?c th?y tr?m tr?ng h?n. Document Released: 07/13/2005 Document Revised: 10/08/2013 [facility] Patient Information 2015 [place], [place]. This information is not intended to  replace advice given to you by your health care provider. Make sure you discuss any questions you have with your health care provider. Subdural Hematoma A subdural hematoma is a collection of blood between the brain and its tough outermost membrane covering (the dura). Blood clots that form in this area push down on the brain and cause irritation. A subdural hematoma may cause parts of the brain to stop working and eventually cause death.  CAUSES A subdural hematoma is caused by bleeding from a ruptured blood vessel (hemorrhage). The bleeding results from trauma to the head, such as from a fall or motor vehicle accident. There are two types of subdural hemorrhages:  Acute. This type develops shortly after a serious blow to the head and causes blood to collect very quickly. If not diagnosed and treated promptly, severe brain injury or death can occur.  Chronic. This is when bleeding develops more slowly, over weeks or months. RISK FACTORS People at risk for subdural hematoma include older persons, infants, and alcoholics. SYMPTOMS An acute subdural hemorrhage develops over minutes to hours. Symptoms can include:  Temporary loss of consciousness.  Weakness of arms or legs on one side of the body.  Changes in vision or speech.  A severe headache.  Seizures.  Nausea and vomiting.  Increased sleepiness. A chronic subdural hemorrhage develops over weeks to months. Symptoms may develop slowly and produce less  noticeable problems or changes. Symptoms include:  A mild headache.  A change in personality.  Loss of balance or difficulty walking.  Weakness, numbness, or tingling in the arms or legs.  Nausea or vomiting.  Memory loss.  Double vision.  Increased sleepiness. DIAGNOSIS Your health care provider will perform a thorough physical and neurological exam. A CT scan or MRI may also be done. If there is blood on the scan, its color will help your health care provider determine how long the hemorrhage has been there. TREATMENT If the cause is an acute subdural hemorrhage, immediate treatment is needed. In many cases an emergency surgery is performed to drain accumulated blood or to remove the blood clot. Sometimes steroid or diuretic medicines or controlled breathing through a ventilator is needed to decrease pressure in the brain. This is especially true if there is any swelling of the brain. If the cause is a chronic subdural hemorrhage, treatment depends on a variety of factors. Sometimes no treatment is needed. If the subdural hematoma is small and causes minimal or no symptoms, you may be treated with bed rest, medicines, and observation. If the hemorrhage is large or if you have neurological symptoms, an emergency surgery is usually needed to remove the blood clot. People who develop a subdural hemorrhage are at risk of developing seizures, even after the subdural hematoma has been treated. You may be prescribed an anti-seizure (anticonvulsant) medicine for a year or longer. HOME CARE INSTRUCTIONS  Only take medicines as directed by your health care provider.  Rest if directed by your health care provider.  Keep all follow-up appointments with your health care provider.  If you play a contact sport such as football, hockey or soccer and you experienced a significant head injury, allow enough time for healing (up to 15 days) before you start playing again. A repeated injury that occurs  during this fragile repair period is likely to result in hemorrhage. This is called the second impact syndrome. SEEK IMMEDIATE MEDICAL CARE IF:  You fall or experience minor trauma to your head and you are taking blood  thinners. If you are on any blood thinners even a very small injury can cause a subdural hematoma. You should not hesitate to seek medical attention regardless of how minor you think your symptoms are.  You experience a head injury and have:  Drowsiness or a decrease in alertness.  Confusion or forgetfulness.  Slurred speech.  Irrational or aggressive behavior.  Numbness or paralysis in any part of the body.  A feeling of being sick to your stomach (nauseous) or you throw up (vomit).  Difficulty walking or poor coordination.  Double vision.  Seizures.  A bleeding disorder.  A history of heavy alcohol use.  Clear fluid draining from your nose or ears.  Personality changes.  Difficulty thinking.  Worsening symptoms. MAKE SURE YOU:  Understand these instructions.  Will watch your condition.  Will get help right away if you are not doing well or get worse. FOR MORE INFORMATION National Institute of Neurological Disorders and Stroke: [place]edoAutomobile.co.ukwww.ninds.nih.gov American Association of Neurological Surgeons: www.neurosurgerytoday.org American Academy of Neurology (AAN): ComparePet.czwww.aan.com Brain Injury Association of America: www.biausa.org Document Released: 08/20/2004 Document Revised: 07/24/2013 Document Reviewed: 04/05/2013 [facility] Patient Information 2015 [place], [place]. This information is not intended to replace advice given to you by your health care provider. Make sure you discuss any questions you have with your health care provider.

## 2014-05-14 NOTE — Progress Notes (Signed)
Pt with d/c orders to home. Discharge instructions reviewed with patient and family. Teach back and ask me 3 method used. PIV removed and intact. All belongings returned to patient. Prescriptions given to patient. Pt and family with no further questions upon discharge. Pt stable upon d/c home.

## 2014-05-14 NOTE — Discharge Summary (Signed)
Physician Discharge Summary  [facility] ZOX:[id number] DOB: 16-Jun-1941 DOA: 05/11/2014  PCP: [name], MD  Admit date: 05/11/2014 Discharge date: 05/14/2014  Time spent: >30 minutes  Recommendations for Outpatient Follow-up:  1. Follow up with Dr Franky Macho as scheduled  2. Follow up with your PCP- call to arrange post hospital visit 3. Home health PT as ordered  Discharge Diagnoses:    Subdural hematoma, post-traumatic-resolving   Atrial fibrillation   Diabetes mellitus   OSA on CPAP   Chronic anticoagulation- now on hold  Discharge Condition: stable  Diet recommendation: Carbohydrate modified  Filed Weights   05/11/14 2154 05/12/14 0245  Weight: 120 lb (54.432 kg) 115 lb 8.3 oz (52.4 kg)    History of present illness:  73 year old female patient with history of atrial fibrillation on chronic anticoagulation as well as diabetes. Patient was sent to the ER after experiencing a fall and noticed to be confused. She apparently fell around 9:15 PM on 05/11/2014. She was unable to get up and walk back into the house and her family found her laying on the steps. She was confused and complained of being lightheaded and then developed nausea.   After arrival to the ER CT scan demonstrated a 3 mm subdural hematoma. Her PT was elevated at 20.2 with an INR of 1.72. She was reversed with vitamin K and Kcentra. Neurosurgery also evaluated the patient while she was in the emergency department and agreed with the rapid reversal protocol for Coumadin. Repeat CT scan was recommended for the following day and no indication for surgical intervention at time of his evaluation.   Hospital Course:   Subdural hematoma, post-traumatic  -CT's showed right subdural hematoma initially measuring 3.1 mm then up to 4.1 mm with repeat on 7/28 down to 2 mm  -Remained neurologically intact  -Attending MD Dr. Joseph Art spoke with Dr. Coletta Memos (neurosurgery) on 7/28 and plans are for outpatient  evaluation in their office in 2 weeks at which time another head CT will be obtained.  -Patient will have to stay off anticoagulations for a minimum 30-90 days. Per neurosurgery since patient has demonstrated being a fall risk, strong possibility they will recommend NO FUTURE anticoagulation.  -The attending MD on 7/28 spoke with her daughter and explained to her that patient would be off anticoagulation a minimum of 30-90 days w/ decision on resuming to be made by Dr. Franky Macho as well as her PCP.    Frontal/supraorbital subcutaneous hematoma  -Stable on followup CT scans  Atrial fibrillation/chronic anticoagulation  -Rate controlled during this admission -Coumadin reversed and will not be resumed at discharge- see neurosurgery recommendations above  Diabetes mellitus  -CBGs controlled  -HgbA1c was 6.3 -Resume metformin after discharge  Procedures:  None  Consultations:  Neurosurgery  Discharge Exam: Filed Vitals:   05/14/14 0951  BP: 120/69  Pulse: 103  Temp: 97.5 F (36.4 C)  Resp: 18   Gen: No acute respiratory distress  ENT: small contusion right supraorbital area - improving  Chest: Clear to auscultation bilaterally without wheezes, rhonchi or crackles, room air  Cardiac: Regular rate and rhythm, S1-S2, no rubs murmurs or gallops, no peripheral edema, no JVD  Abdomen: Soft nontender nondistended without obvious hepatosplenomegaly, no ascites  Extremities: Symmetrical in appearance without cyanosis, clubbing or effusion  Neurological: Alert and oriented, no focal deficits    Medication List    STOP taking these medications       warfarin 1 MG tablet  Commonly known as:  COUMADIN      TAKE these medications       alendronate 70 MG tablet  Commonly known as:  FOSAMAX  Take 70 mg by mouth once a week. On mondays     atenolol 25 MG tablet  Commonly known as:  TENORMIN  Take 25 mg by mouth daily.     b complex vitamins tablet  Take 1 tablet by mouth daily.       bacitracin ointment  Apply 1 application topically 2 (two) times daily.     CALTRATE 600 PLUS-VIT D PO  Take 1 tablet by mouth 2 (two) times daily.     chlorpheniramine-HYDROcodone 10-8 MG/5ML Lqcr  Commonly known as:  TUSSIONEX PENNKINETIC ER  Take 5 mLs by mouth every 12 (twelve) hours as needed for cough.     DAILY MULTIVITAMIN PO  Take 1 tablet by mouth daily.     DSS 100 MG Caps  Take 100 mg by mouth 2 (two) times daily.     fluticasone 50 MCG/ACT nasal spray  Commonly known as:  FLONASE  Place 1 spray into both nostrils daily as needed.     HYDROcodone-acetaminophen 5-325 MG per tablet  Commonly known as:  NORCO/VICODIN  Take 1 tablet by mouth every 6 (six) hours as needed for severe pain.     Krill Oil 1000 MG Caps  Take 1,000 mg by mouth daily.     metFORMIN 500 MG 24 hr tablet  Commonly known as:  GLUCOPHAGE-XR  Take 750 mg by mouth 2 (two) times daily.     ondansetron 4 MG tablet  Commonly known as:  ZOFRAN  Take 1 tablet (4 mg total) by mouth every 8 (eight) hours as needed for nausea or vomiting.     pravastatin 10 MG tablet  Commonly known as:  PRAVACHOL  Take 10 mg by mouth daily.     traMADol 50 MG tablet  Commonly known as:  ULTRAM  Take 50 mg by mouth 2 (two) times daily.       Allergies  Allergen Reactions  . Banana Swelling    Hands and feet  . Chocolate Itching  . Peanut-Containing Drug Products Itching    Throat itches, no swelling   Follow-up Information   Follow up with CABBELL,KYLE L, MD. Schedule an appointment as soon as possible for a visit in 2 weeks.   Specialty:  Neurosurgery   Contact information:   [address] [place] [address] [phone]       Schedule an appointment as soon as possible for a visit with [facility].   Specialty:  Internal Medicine   Contact information:   [phone] [place] [address] [phone]      Significant Diagnostic Studies: Dg Thoracic Spine  2 View  05/11/2014   CLINICAL DATA:  Larey SeatFell today with back pain  EXAM: THORACIC SPINE - 2 VIEW  COMPARISON:  01/18/2008  FINDINGS: Mild to moderate sigmoid scoliotic curvature the thoracolumbar spine. There is a 50% compression fracture at T12. T12 is moderately sclerotic with osteophytes at T11-12 and T12-L1. There is moderate multilevel spondylosis with no other fractures.  IMPRESSION: Known moderate T9 compression deformity. No other abnormalities. This fracture is new from 2009.   Electronically Signed   By: Esperanza Heiraymond  Rubner M.D.   On: 05/11/2014 23:23   Dg Lumbar Spine Complete  05/11/2014   CLINICAL DATA:  Mid upper back pain secondary to a fall today. History of T12  fracture.  EXAM: LUMBAR SPINE - COMPLETE 4+ VIEW  COMPARISON:  CT scan of the abdomen dated 01/18/2008  FINDINGS: There is an old severe compression fracture of the T12 vertebral body with loss of 75% of the anterior vertebral body height. This accentuates the thoracic kyphosis.  Lumbar spine is diffusely osteopenic but there is no disc space narrowing or subluxation. There are slight degenerative changes of the facet joints at L4-5 and L5-S1 on the right.  IMPRESSION: No acute abnormalities of the lumbar spine. Old compression fracture of T12.   Electronically Signed   By: Geanie Cooley M.D.   On: 05/11/2014 23:26   Ct Head Wo Contrast  05/13/2014   CLINICAL DATA:  Subdural hematoma.  EXAM: CT HEAD WITHOUT CONTRAST  TECHNIQUE: Contiguous axial images were obtained from the base of the skull through the vertex without intravenous contrast.  COMPARISON:  CT of the head May 12, 2014  FINDINGS: Severe motion, required rescanning though, this somewhat limits evaluation. 2 mm residual right temporal subdural hematoma, no subarachnoid blood. Symmetric basal ganglia mineralization.  The ventricles and sulci are overall normal for patient's age. No acute large vascular territory infarct. Mild white matter changes suggest chronic small vessel ischemic  disease.  Decreased residual moderate right frontal scalp hematoma. No underlying skull fracture. Visualized paranasal sinuses and mastoid air cells are well aerated.  IMPRESSION: Motion degraded examination required Re imaging, overall, limiting evaluation.  2 mm residual right temporal subdural hematoma, resolution of subarachnoid hemorrhage.  Degenerating right frontal scalp hematoma without underlying skull fracture.   Electronically Signed   By: Awilda Metro   On: 05/13/2014 06:48   Ct Head Wo Contrast  05/12/2014   CLINICAL DATA:  Fall 05/11/2014.  On Coumadin.  EXAM: CT HEAD WITHOUT CONTRAST  TECHNIQUE: Contiguous axial images were obtained from the base of the skull through the vertex without intravenous contrast.  COMPARISON:  05/11/2014.  FINDINGS: Large right frontal/supraorbital subcutaneous hematoma. Central rounded component has consolidated now measuring 1.8 x 1.3 cm versus prior 1.2 x 1 cm.  Broad-based right convexity subdural hematoma may be minimally more prominent with maximal thickness of 4.1 mm versus prior 3.1 mm.  Tiny amount of subarachnoid blood posterior right operculum region without change.  Slight thickness of the anterior falx felt most likely to represent calcification rather than subdural hematoma.  Small vessel disease type changes without CT evidence of large acute infarct.  Prominent mineralization globus pallidus greater on the right. This is felt unlikely to represent hemorrhage.  No intracranial mass lesion noted on this unenhanced exam.  IMPRESSION: Large right frontal/supraorbital subcutaneous hematoma. Central rounded component has consolidated now measuring 1.8 x 1.3 cm versus prior 1.2 x 1 cm.  Broad-based right convexity subdural hematoma may be minimally more prominent with maximal thickness of 4.1 mm versus prior 3.1 mm.  Tiny amount of subarachnoid blood posterior right operculum region without change.  Please see above.   Electronically Signed   By: Bridgett Larsson M.D.   On: 05/12/2014 08:08   Ct Head Wo Contrast  05/11/2014   CLINICAL DATA:  Fall, head trauma, on Coumadin.  EXAM: CT HEAD WITHOUT CONTRAST  CT CERVICAL SPINE WITHOUT CONTRAST  TECHNIQUE: Multidetector CT imaging of the head and cervical spine was performed following the standard protocol without intravenous contrast. Multiplanar CT image reconstructions of the cervical spine were also generated.  COMPARISON:  None.  FINDINGS: CT HEAD FINDINGS  The ventricles and sulci are normal for age. No  intraparenchymal hemorrhage, mass effect nor midline shift. Patchy supratentorial white matter hypodensities are within normal range for patient's age and though non-specific suggest sequelae of chronic small vessel ischemic disease. Symmetric basal ganglia mineralization. No acute large vascular territory infarcts.  3 mm extensive right temporal parietal extra-axial fluid collections. Trace right temporal parietal subarachnoid blood. Basal cisterns are patent. Moderate calcific atherosclerosis of the carotid siphons.  Large right periorbital/frontal scalp hematoma without subcutaneous gas or radiopaque foreign bodies. No skull fracture. The included ocular globes and orbital contents are non-suspicious. Status post bilateral ocular lens implants. The mastoid aircells and included paranasal sinuses are well-aerated.  CT CERVICAL SPINE FINDINGS  Cervical vertebral bodies and posterior elements are intact and aligned with straightened cervical lordosis. Intervertebral disc heights preserved. No destructive bony lesions. C1-2 articulation maintained. Included prevertebral and paraspinal soft tissues are unremarkable.  IMPRESSION: 3 mm acute right temporoparietal subdural hematoma with underlying trace subarachnoid hemorrhage.  Large right cerebral/frontal scalp hematoma without underlying skull fracture.  CT cervical spine: Straightened cervical lordosis  Findings discussed with and reconfirmed by Dr.WILLIAM WALDEN  on7/26/2015at11:58 pm.   Electronically Signed   By: Awilda Metro   On: 05/11/2014 23:59   Ct Cervical Spine Wo Contrast  05/11/2014   CLINICAL DATA:  Fall, head trauma, on Coumadin.  EXAM: CT HEAD WITHOUT CONTRAST  CT CERVICAL SPINE WITHOUT CONTRAST  TECHNIQUE: Multidetector CT imaging of the head and cervical spine was performed following the standard protocol without intravenous contrast. Multiplanar CT image reconstructions of the cervical spine were also generated.  COMPARISON:  None.  FINDINGS: CT HEAD FINDINGS  The ventricles and sulci are normal for age. No intraparenchymal hemorrhage, mass effect nor midline shift. Patchy supratentorial white matter hypodensities are within normal range for patient's age and though non-specific suggest sequelae of chronic small vessel ischemic disease. Symmetric basal ganglia mineralization. No acute large vascular territory infarcts.  3 mm extensive right temporal parietal extra-axial fluid collections. Trace right temporal parietal subarachnoid blood. Basal cisterns are patent. Moderate calcific atherosclerosis of the carotid siphons.  Large right periorbital/frontal scalp hematoma without subcutaneous gas or radiopaque foreign bodies. No skull fracture. The included ocular globes and orbital contents are non-suspicious. Status post bilateral ocular lens implants. The mastoid aircells and included paranasal sinuses are well-aerated.  CT CERVICAL SPINE FINDINGS  Cervical vertebral bodies and posterior elements are intact and aligned with straightened cervical lordosis. Intervertebral disc heights preserved. No destructive bony lesions. C1-2 articulation maintained. Included prevertebral and paraspinal soft tissues are unremarkable.  IMPRESSION: 3 mm acute right temporoparietal subdural hematoma with underlying trace subarachnoid hemorrhage.  Large right cerebral/frontal scalp hematoma without underlying skull fracture.  CT cervical spine: Straightened cervical  lordosis  Findings discussed with and reconfirmed by Dr.WILLIAM WALDEN on7/26/2015at11:58 pm.   Electronically Signed   By: Awilda Metro   On: 05/11/2014 23:59   Dg Humerus Right  05/11/2014   CLINICAL DATA:  Fall today, anterior pain to right humerus  EXAM: RIGHT HUMERUS - 2+ VIEW  COMPARISON:  None.  FINDINGS: There is no evidence of fracture or other focal bone lesions. Soft tissues are unremarkable.  IMPRESSION: Negative.   Electronically Signed   By: Esperanza Heir M.D.   On: 05/11/2014 23:24    Microbiology: Recent Results (from the past 240 hour(s))  MRSA PCR SCREENING     Status: None   Collection Time    05/12/14  6:01 AM      Result Value Ref Range Status   MRSA  by PCR NEGATIVE  NEGATIVE Final   Comment:            The GeneXpert MRSA Assay (FDA     approved for NASAL specimens     only), is one component of a     comprehensive MRSA colonization     surveillance program. It is not     intended to diagnose MRSA     infection nor to guide or     monitor treatment for     MRSA infections.     Labs: Basic Metabolic Panel:  Recent Labs Lab 05/11/14 2211 05/12/14 0336 05/13/14 0300  NA 139 144 140  K 4.5 4.6 4.4  CL 98 102 104  CO2 24 27 24   GLUCOSE 203* 152* 123*  BUN 19 16 13   CREATININE 0.55 0.53 0.59  CALCIUM 9.3 9.1 8.6  MG  --  2.0  --   PHOS  --  3.4  --    Liver Function Tests:  Recent Labs Lab 05/12/14 0336  AST 22  ALT 16  ALKPHOS 38*  BILITOT 0.4  PROT 7.1  ALBUMIN 4.3   CBC:  Recent Labs Lab 05/11/14 2211 05/12/14 0336 05/13/14 0300  WBC 6.9 12.8* 6.8  HGB 13.2 12.4 11.3*  HCT 39.5 37.6 34.5*  MCV 94.5 94.5 95.3  PLT 312 285 264   CBG:  Recent Labs Lab 05/13/14 1145 05/13/14 1245 05/13/14 1646 05/13/14 2302 05/14/14 0639  GLUCAP 116* 109* 117* 140* 127*   Signed:  ELLIS,ALLISON L. ANP [facility] 05/14/2014, 11:12 AM  I have personally examined this patient and reviewed the entire database. I have  reviewed the above note, made any necessary editorial changes, and agree with its content.  Lonia Blood, MD [facility]

## 2014-08-25 ENCOUNTER — Encounter (HOSPITAL_COMMUNITY): Payer: Self-pay

## 2014-08-25 ENCOUNTER — Inpatient Hospital Stay (HOSPITAL_COMMUNITY)
Admission: EM | Admit: 2014-08-25 | Discharge: 2014-08-26 | DRG: 552 | Disposition: A | Discharge status: Home Health | Payer: Medicare Other | Attending: Internal Medicine | Admitting: Internal Medicine

## 2014-08-25 ENCOUNTER — Emergency Department (HOSPITAL_COMMUNITY): Payer: Medicare Other

## 2014-08-25 ENCOUNTER — Observation Stay (HOSPITAL_COMMUNITY): Payer: Medicare Other

## 2014-08-25 DIAGNOSIS — Z79899 Other long term (current) drug therapy: Secondary | ICD-10-CM | POA: Diagnosis not present

## 2014-08-25 DIAGNOSIS — J209 Acute bronchitis, unspecified: Secondary | ICD-10-CM

## 2014-08-25 DIAGNOSIS — S32009A Unspecified fracture of unspecified lumbar vertebra, initial encounter for closed fracture: Secondary | ICD-10-CM

## 2014-08-25 DIAGNOSIS — I4891 Unspecified atrial fibrillation: Secondary | ICD-10-CM | POA: Diagnosis present

## 2014-08-25 DIAGNOSIS — Z792 Long term (current) use of antibiotics: Secondary | ICD-10-CM | POA: Diagnosis not present

## 2014-08-25 DIAGNOSIS — S32019A Unspecified fracture of first lumbar vertebra, initial encounter for closed fracture: Secondary | ICD-10-CM | POA: Diagnosis present

## 2014-08-25 DIAGNOSIS — Z79891 Long term (current) use of opiate analgesic: Secondary | ICD-10-CM

## 2014-08-25 DIAGNOSIS — Z7983 Long term (current) use of bisphosphonates: Secondary | ICD-10-CM | POA: Diagnosis not present

## 2014-08-25 DIAGNOSIS — E119 Type 2 diabetes mellitus without complications: Secondary | ICD-10-CM | POA: Diagnosis present

## 2014-08-25 DIAGNOSIS — W19XXXA Unspecified fall, initial encounter: Secondary | ICD-10-CM | POA: Diagnosis present

## 2014-08-25 DIAGNOSIS — R0602 Shortness of breath: Secondary | ICD-10-CM | POA: Diagnosis present

## 2014-08-25 DIAGNOSIS — Z7982 Long term (current) use of aspirin: Secondary | ICD-10-CM | POA: Diagnosis not present

## 2014-08-25 DIAGNOSIS — Z9181 History of falling: Secondary | ICD-10-CM

## 2014-08-25 DIAGNOSIS — G4733 Obstructive sleep apnea (adult) (pediatric): Secondary | ICD-10-CM | POA: Diagnosis present

## 2014-08-25 DIAGNOSIS — N390 Urinary tract infection, site not specified: Secondary | ICD-10-CM | POA: Diagnosis present

## 2014-08-25 DIAGNOSIS — R531 Weakness: Secondary | ICD-10-CM

## 2014-08-25 DIAGNOSIS — I482 Chronic atrial fibrillation, unspecified: Secondary | ICD-10-CM | POA: Diagnosis present

## 2014-08-25 DIAGNOSIS — Z9989 Dependence on other enabling machines and devices: Secondary | ICD-10-CM

## 2014-08-25 HISTORY — DX: Pure hyperglyceridemia: E78.1

## 2014-08-25 HISTORY — DX: Traumatic subdural hemorrhage with loss of consciousness of unspecified duration, initial encounter: S06.5X9A

## 2014-08-25 HISTORY — DX: Dependence on other enabling machines and devices: Z99.89

## 2014-08-25 HISTORY — DX: Obstructive sleep apnea (adult) (pediatric): G47.33

## 2014-08-25 HISTORY — DX: Gastro-esophageal reflux disease without esophagitis: K21.9

## 2014-08-25 HISTORY — DX: Dorsalgia, unspecified: M54.9

## 2014-08-25 HISTORY — DX: Type 2 diabetes mellitus without complications: E11.9

## 2014-08-25 HISTORY — DX: Pneumonia, unspecified organism: J18.9

## 2014-08-25 HISTORY — DX: Other chronic pain: G89.29

## 2014-08-25 LAB — BASIC METABOLIC PANEL
Anion gap: 15 (ref 5–15)
BUN: 14 mg/dL (ref 6–23)
CALCIUM: 10 mg/dL (ref 8.4–10.5)
CHLORIDE: 99 meq/L (ref 96–112)
CO2: 26 meq/L (ref 19–32)
Creatinine, Ser: 0.53 mg/dL (ref 0.50–1.10)
GFR calc non Af Amer: >90 mL/min (ref >90)
Glucose, Bld: 111 mg/dL — ABNORMAL HIGH (ref 70–99)
Potassium: 4.5 mEq/L (ref 3.7–5.3)
SODIUM: 140 meq/L (ref 137–147)

## 2014-08-25 LAB — CBC
HEMATOCRIT: 41 % (ref 36.0–46.0)
Hemoglobin: 13.6 g/dL (ref 12.0–15.0)
MCH: 31.6 pg (ref 26.0–34.0)
MCHC: 33.2 g/dL (ref 30.0–36.0)
MCV: 95.3 fL (ref 78.0–100.0)
Platelets: 331 10*3/uL (ref 150–400)
RBC: 4.3 MIL/uL (ref 3.87–5.11)
RDW: 11.8 % (ref 11.5–15.5)
WBC: 9.2 10*3/uL (ref 4.0–10.5)

## 2014-08-25 LAB — URINALYSIS, ROUTINE W REFLEX MICROSCOPIC
Bilirubin Urine: NEGATIVE
Glucose, UA: NEGATIVE mg/dL
KETONES UR: NEGATIVE mg/dL
NITRITE: NEGATIVE
Protein, ur: NEGATIVE mg/dL
SPECIFIC GRAVITY, URINE: 1.011 (ref 1.005–1.030)
Urobilinogen, UA: 0.2 mg/dL (ref 0.0–1.0)
pH: 6 (ref 5.0–8.0)

## 2014-08-25 LAB — URINE MICROSCOPIC-ADD ON

## 2014-08-25 LAB — I-STAT TROPONIN, ED: TROPONIN I, POC: 0.02 ng/mL (ref 0.00–0.08)

## 2014-08-25 LAB — PRO B NATRIURETIC PEPTIDE: Pro B Natriuretic peptide (BNP): 976.2 pg/mL — ABNORMAL HIGH (ref 0–125)

## 2014-08-25 MED ORDER — BISACODYL 10 MG RE SUPP: 10.0000 mg | Freq: Every day | RECTAL | Status: DC | PRN

## 2014-08-25 MED ORDER — ATENOLOL 12.5 MG HALF TABLET
12.5000 mg | ORAL_TABLET | Freq: Every day | ORAL | Status: DC
  Administered 2014-08-26: 12.5 mg via ORAL
  Filled 2014-08-25: qty 1

## 2014-08-25 MED ORDER — LEVOFLOXACIN IN D5W 500 MG/100ML IV SOLN
500.0000 mg | INTRAVENOUS | Status: DC
  Administered 2014-08-26: 500 mg via INTRAVENOUS
  Filled 2014-08-25 (×3): qty 100

## 2014-08-25 MED ORDER — ONDANSETRON HCL 4 MG PO TABS: 4.0000 mg | ORAL_TABLET | Freq: Three times a day (TID) | ORAL | Status: DC | PRN

## 2014-08-25 MED ORDER — SODIUM CHLORIDE 0.9 % IJ SOLN
3.0000 mL | Freq: Two times a day (BID) | INTRAMUSCULAR | Status: DC
  Administered 2014-08-25 – 2014-08-26 (×2): 3 mL via INTRAVENOUS

## 2014-08-25 MED ORDER — ALBUTEROL SULFATE (2.5 MG/3ML) 0.083% IN NEBU: 2.5000 mg | INHALATION_SOLUTION | RESPIRATORY_TRACT | Status: DC | PRN

## 2014-08-25 MED ORDER — OXYCODONE HCL 5 MG PO TABS: 5.0000 mg | ORAL_TABLET | ORAL | Status: DC | PRN

## 2014-08-25 MED ORDER — INSULIN ASPART 100 UNIT/ML ~~LOC~~ SOLN: 0.0000 [IU] | Freq: Three times a day (TID) | SUBCUTANEOUS | Status: DC

## 2014-08-25 MED ORDER — KRILL OIL 1000 MG PO CAPS: 1000.0000 mg | ORAL_CAPSULE | Freq: Two times a day (BID) | ORAL | Status: DC

## 2014-08-25 MED ORDER — HEPARIN SODIUM (PORCINE) 5000 UNIT/ML IJ SOLN
5000.0000 [IU] | Freq: Three times a day (TID) | INTRAMUSCULAR | Status: DC
  Administered 2014-08-25 – 2014-08-26 (×2): 5000 [IU] via SUBCUTANEOUS
  Filled 2014-08-25 (×3): qty 1

## 2014-08-25 MED ORDER — FLUTICASONE PROPIONATE 50 MCG/ACT NA SUSP
1.0000 | Freq: Every day | NASAL | Status: DC | PRN
  Filled 2014-08-25: qty 16

## 2014-08-25 MED ORDER — TRAMADOL HCL 50 MG PO TABS
50.0000 mg | ORAL_TABLET | Freq: Two times a day (BID) | ORAL | Status: DC | PRN
  Administered 2014-08-26: 50 mg via ORAL
  Filled 2014-08-25: qty 1

## 2014-08-25 MED ORDER — ACETAMINOPHEN 650 MG RE SUPP: 650.0000 mg | Freq: Four times a day (QID) | RECTAL | Status: DC | PRN

## 2014-08-25 MED ORDER — MONTELUKAST SODIUM 10 MG PO TABS
10.0000 mg | ORAL_TABLET | Freq: Every day | ORAL | Status: DC
  Administered 2014-08-25: 10 mg via ORAL
  Filled 2014-08-25 (×2): qty 1

## 2014-08-25 MED ORDER — ACETAMINOPHEN 325 MG PO TABS
650.0000 mg | ORAL_TABLET | Freq: Four times a day (QID) | ORAL | Status: DC | PRN
  Administered 2014-08-25: 650 mg via ORAL
  Filled 2014-08-25: qty 2

## 2014-08-25 MED ORDER — PRAVASTATIN SODIUM 10 MG PO TABS
10.0000 mg | ORAL_TABLET | Freq: Every day | ORAL | Status: DC
  Administered 2014-08-25: 10 mg via ORAL
  Filled 2014-08-25 (×3): qty 1

## 2014-08-25 MED ORDER — ASPIRIN EC 81 MG PO TBEC
81.0000 mg | DELAYED_RELEASE_TABLET | Freq: Every day | ORAL | Status: DC
  Administered 2014-08-26: 81 mg via ORAL
  Filled 2014-08-25: qty 1

## 2014-08-25 MED ORDER — HYDROCOD POLST-CHLORPHEN POLST 10-8 MG/5ML PO LQCR
5.0000 mL | Freq: Two times a day (BID) | ORAL | Status: DC | PRN
  Administered 2014-08-25: 5 mL via ORAL
  Filled 2014-08-25: qty 5

## 2014-08-25 NOTE — ED Provider Notes (Signed)
CSN: [id number]     Arrival date & time 08/25/14  1306 History   First MD Initiated Contact with Patient 08/25/14 1609     Chief Complaint  Patient presents with  . Cough  . Fall  . Generalized Body Aches     (Consider location/radiation/quality/duration/timing/severity/associated sxs/prior Treatment) HPI Comments: 73 y/o female with h/o atrial fibrillation (no anticoagulation due to SDH in July and fall risk), DM, OSA presenting with fall 10/31 resulting in lumbar back pain and LE weakness. Also with SOB, cough, fever x 6 days. She reports fall occurred while walking up stairs. She is unsure of cause of fall but felt off balance. She did not hit her head or lose consciousness. She has been incontinent of urine since the fall. She then began having cough productive of white/yellow sputum intermittently tinged with blood and increasing SOB. Max temp at home 101.8. She did get her flu shot this year. She is on CPAP at night but no home O2. Denies chest pain. No h/o prior DVT/PE or CHF.   The history is provided by the patient. No language interpreter was used.    Past Medical History  Diagnosis Date  . OSA (obstructive sleep apnea)   . Atrial fibrillation   . Diabetes mellitus without complication   . Osteoarthritis   . Compression fracture of T12 vertebra   . T12 compression fracture   . Subdural hematoma july 2015   Past Surgical History  Procedure Laterality Date  . Abdominal hysterectomy    . Appendectomy     Family History  Problem Relation Age of Onset  . Family history unknown: Yes   History  Substance Use Topics  . Smoking status: Never Smoker   . Smokeless tobacco: Not on file  . Alcohol Use: No   OB History    No data available     Review of Systems  Constitutional: Positive for fever, chills and fatigue.  HENT: Negative for trouble swallowing.   Eyes: Negative for visual disturbance.  Respiratory: Positive for cough, chest tightness and shortness of breath.    Cardiovascular: Positive for palpitations. Negative for chest pain and leg swelling.  Gastrointestinal: Negative for nausea, vomiting and abdominal pain.  Genitourinary: Negative for dysuria.  Musculoskeletal: Positive for back pain and gait problem.  Neurological: Positive for headaches. Negative for dizziness, syncope, speech difficulty, weakness and numbness.  Hematological: Does not bruise/bleed easily.  All other systems reviewed and are negative.     Allergies  Banana; Chocolate; and Peanut-containing drug products  Home Medications   Prior to Admission medications   Medication Sig Start Date End Date Taking? Authorizing Provider  alendronate (FOSAMAX) 70 MG tablet Take 70 mg by mouth once a week. On mondays 04/30/14   Historical Provider, MD  atenolol (TENORMIN) 25 MG tablet Take 25 mg by mouth daily. 03/15/14   Historical Provider, MD  b complex vitamins tablet Take 1 tablet by mouth daily.    Historical Provider, MD  bacitracin ointment Apply 1 application topically 2 (two) times daily. 05/14/14   Russella Dar, NP  Calcium-Vitamin D (CALTRATE 600 PLUS-VIT D PO) Take 1 tablet by mouth 2 (two) times daily.    Historical Provider, MD  chlorpheniramine-HYDROcodone (TUSSIONEX PENNKINETIC ER) 10-8 MG/5ML LQCR Take 5 mLs by mouth every 12 (twelve) hours as needed for cough. 05/14/14   Russella Dar, NP  docusate sodium 100 MG CAPS Take 100 mg by mouth 2 (two) times daily. 05/14/14   Russella Dar,  NP  fluticasone (FLONASE) 50 MCG/ACT nasal spray Place 1 spray into both nostrils daily as needed. 02/18/14   Historical Provider, MD  HYDROcodone-acetaminophen (NORCO/VICODIN) 5-325 MG per tablet Take 1 tablet by mouth every 6 (six) hours as needed for severe pain.    Historical Provider, MD  Boris LownKrill Oil 1000 MG CAPS Take 1,000 mg by mouth daily.    Historical Provider, MD  metFORMIN (GLUCOPHAGE-XR) 500 MG 24 hr tablet Take 750 mg by mouth 2 (two) times daily. 04/30/14   Historical Provider,  MD  Multiple Vitamin (DAILY MULTIVITAMIN PO) Take 1 tablet by mouth daily.    Historical Provider, MD  ondansetron (ZOFRAN) 4 MG tablet Take 1 tablet (4 mg total) by mouth every 8 (eight) hours as needed for nausea or vomiting. 05/14/14   Russella DarAllison L Ellis, NP  pravastatin (PRAVACHOL) 10 MG tablet Take 10 mg by mouth daily. 04/30/14   Historical Provider, MD  traMADol (ULTRAM) 50 MG tablet Take 1 tablet (50 mg total) by mouth 2 (two) times daily. 05/14/14   Russella DarAllison L Ellis, NP   BP 121/66 mmHg  Pulse 82  Temp(Src) 98.3 F (36.8 C) (Oral)  Resp 18  SpO2 96% Physical Exam  Constitutional: She is oriented to person, place, and time. She appears well-developed and well-nourished. No distress.  HENT:  Head: Normocephalic.  Mouth/Throat: Oropharynx is clear and moist.  Eyes: EOM are normal. Pupils are equal, round, and reactive to light.  Neck: Neck supple. No JVD present. No tracheal deviation present.  Cardiovascular: Normal rate, normal heart sounds and intact distal pulses.  An irregular rhythm present.  Pulmonary/Chest: Effort normal. She has rales (faint crackles at bases).  Abdominal: Soft. Bowel sounds are normal. She exhibits no distension. There is no tenderness.  Musculoskeletal: She exhibits edema (trace pretibial).  Neurological: She is alert and oriented to person, place, and time. She has normal strength. No cranial nerve deficit or sensory deficit.  Skin: Skin is warm and dry.  Vitals reviewed.   ED Course  Procedures (including critical care time) Labs Review Labs Reviewed  BASIC METABOLIC PANEL - Abnormal; Notable for the following:    Glucose, Bld 111 (*)    All other components within normal limits  PRO B NATRIURETIC PEPTIDE - Abnormal; Notable for the following:    Pro B Natriuretic peptide (BNP) 976.2 (*)    All other components within normal limits  CBC  I-STAT TROPOININ, ED    Imaging Review Dg Chest 2 View  08/25/2014   CLINICAL DATA:  Pain after falling 10  days previously. History of atrial fibrillation and intermittent difficulty breathing  EXAM: CHEST  2 VIEW  COMPARISON:  Thoracic spine series May 11, 2014  FINDINGS: There is mild scarring in the left base. There is no frank edema or consolidation. The heart size and pulmonary vascularity are normal. No adenopathy.  Anterior wedging of the T12 vertebral body is stable. There is mild anterior wedging of the L1 vertebral body which was not present on prior thoracic spine series.  IMPRESSION: Scarring left base. No edema or consolidation. Lower thoracic and upper lumbar compression fractures as described.   Electronically Signed   By: Bretta BangWilliam  Woodruff M.D.   On: 08/25/2014 14:36     EKG Interpretation   Date/Time:  Monday August 25 2014 13:20:25 EST Ventricular Rate:  84 PR Interval:    QRS Duration: 86 QT Interval:  358 QTC Calculation: 423 R Axis:   31 Text Interpretation:  Atrial fibrillation Abnormal  ECG ED PHYSICIAN  INTERPRETATION AVAILABLE IN [facility] Confirmed by TEST, Record  ([id number]) on 08/27/2014 7:50:32 AM      MDM   Final diagnoses:  SOB (shortness of breath)    73 y/o female with h/o atrial fibrillation, OSA on cpap, DM presenting with fall, lumbar back pain, and worsening SOB with cough. Fall occurred 10/31. New L1 fx on CXR. Also notes urinary incontinence so MRI will be obtained. She has no LE weakness and intact rectal tone. Low suspicion of cauda equina. CXR does not show pulmonary edema and she has mild LE edema with normal O2 sats on RA. BNP is elevated with no prior for comparison and no h/o CHF. Troponin negative and no EKG changes, doubt ACS. Also doubt PE as etiology of dyspnea given productive cough and fever at home. Will admit to Hospitalist for falls, MRI, respiratory symptoms.     Abagail KitchensMegan Tirso Laws, MD 08/27/14 1514  Enid SkeensJoshua M Zavitz, MD 08/29/14 [phone]

## 2014-08-25 NOTE — ED Notes (Signed)
Report called to floor, MRI made aware pt can go to floor after procedure. Order obtained from admitting MD for transport off tele. No belongings left in room.

## 2014-08-25 NOTE — ED Notes (Signed)
Patient transported to MRI 

## 2014-08-25 NOTE — ED Notes (Signed)
Pt here with daughter. Does not speak english. Pt fell on halloween night and since then has been having issues with feeling weak in both legs and having pain in them. Hx of afib and has been feeling SOB more than usual. Wears CPAP at night and has been coughing and stopped wearing it because she thought that was making the cough worse. Has been getting up blood tinged sputum in the mornings and white throughout the day. Has reported a HA since Wednesday and fever Friday and throughout the weekend. Highest being 101.8.

## 2014-08-25 NOTE — H&P (Signed)
Patient Demographics  [name], is a 73 y.o. female  MRN: [id number]   DOB - 1941-10-07  Admit Date - 08/25/2014  Outpatient Primary MD for the patient is [facility], MD   With History of -  Past Medical History  Diagnosis Date  . OSA (obstructive sleep apnea)   . Atrial fibrillation   . Diabetes mellitus without complication   . Osteoarthritis   . Compression fracture of T12 vertebra   . T12 compression fracture   . Subdural hematoma july 2015      Past Surgical History  Procedure Laterality Date  . Abdominal hysterectomy    . Appendectomy      in for   Chief Complaint  Patient presents with  . Cough  . Fall  . Generalized Body Aches     HPI  [name]  is a 73 y.o. female,with history of atrial fibrillation not on anticoagulation secondary to multiple falls, dense with multiple complaints mainly generalized weakness, cough, nonproductive, fever, lower back pain, patient reports she had full Halloween for 9 days, denies any head trauma, chest x-ray did not show any evidence of pneumonia, but did show a T12 fracture, there is no lower extremity weakness, patient had good rectal tone on physical exam by ED physician, as well urinalysis was positive, and patient had elevated BNP, hospitalist requested to admit the patient for pain management, and treatment of her bronchitis and UTI.    Review of Systems    In addition to the HPI above,  Reports fever as high 101.8 at home No Headache, No changes with Vision or hearing, No problems swallowing food or Liquids, No Chest pain,or Shortness of Breath,complains of cough No Abdominal pain, No Nausea or Vommitting, Bowel movements are regular, No Blood in stool or Urine, No dysuria, No new skin rashes or bruises, No new joints pains-aches, complaints of lower back pain No new weakness, tingling, numbness in any extremity, No recent weight gain or loss, No polyuria, polydypsia or polyphagia, No significant  Mental Stressors.  A full 10 point Review of Systems was done, except as stated above, all other Review of Systems were negative.   Social History History  Substance Use Topics  . Smoking status: Never Smoker   . Smokeless tobacco: Not on file  . Alcohol Use: No     Family History Family History  Problem Relation Age of Onset  . Family history unknown: Yes     Prior to Admission medications   Medication Sig Start Date End Date Taking? Authorizing Provider  alendronate (FOSAMAX) 70 MG tablet Take 70 mg by mouth every Monday.  04/30/14  Yes Historical Provider, MD  aspirin EC 81 MG tablet Take 81 mg by mouth daily.   Yes Historical Provider, MD  atenolol (TENORMIN) 25 MG tablet Take 12.5 mg by mouth daily.  03/15/14  Yes Historical Provider, MD  b complex vitamins tablet Take 1 tablet by mouth daily.   Yes Historical Provider, MD  bacitracin ointment Apply 1 application topically 2 (two) times daily. Patient taking differently: Apply 1 application topically as needed for wound care.  05/14/14  Yes Russella Dar, NP  Calcium-Vitamin D (CALTRATE 600 PLUS-VIT D PO) Take 1 tablet by mouth 2 (two) times daily.   Yes Historical Provider, MD  chlorpheniramine-HYDROcodone (TUSSIONEX PENNKINETIC ER) 10-8 MG/5ML LQCR Take 5 mLs by mouth every 12 (twelve) hours as needed for cough. 05/14/14  Yes Russella Dar, NP  docusate sodium 100 MG  CAPS Take 100 mg by mouth 2 (two) times daily. Patient taking differently: Take 100 mg by mouth 2 (two) times daily as needed (constipation).  05/14/14  Yes Russella DarAllison L Ellis, NP  Krill Oil 1000 MG CAPS Take 1,000 mg by mouth 2 (two) times daily.    Yes Historical Provider, MD  metFORMIN (GLUCOPHAGE-XR) 500 MG 24 hr tablet Take 500 mg by mouth 2 (two) times daily.  04/30/14  Yes Historical Provider, MD  montelukast (SINGULAIR) 10 MG tablet Take 10 mg by mouth at bedtime.  08/15/14  Yes Historical Provider, MD  Multiple Vitamin (DAILY MULTIVITAMIN PO) Take 1 tablet  by mouth daily.   Yes Historical Provider, MD  ondansetron (ZOFRAN) 4 MG tablet Take 1 tablet (4 mg total) by mouth every 8 (eight) hours as needed for nausea or vomiting. 05/14/14  Yes Russella DarAllison L Ellis, NP  pravastatin (PRAVACHOL) 10 MG tablet Take 10 mg by mouth daily. 04/30/14  Yes Historical Provider, MD  traMADol (ULTRAM) 50 MG tablet Take 1 tablet (50 mg total) by mouth 2 (two) times daily. Patient taking differently: Take 50 mg by mouth 2 (two) times daily as needed.  05/14/14  Yes Russella DarAllison L Ellis, NP  fluticasone (FLONASE) 50 MCG/ACT nasal spray Place 1 spray into both nostrils daily as needed for allergies.  02/18/14   Historical Provider, MD  HYDROcodone-acetaminophen (NORCO/VICODIN) 5-325 MG per tablet Take 1 tablet by mouth every 6 (six) hours as needed for severe pain.    Historical Provider, MD    Allergies  Allergen Reactions  . Banana Swelling    Hands and feet  . Chocolate Itching  . Peanut-Containing Drug Products Itching    Throat itches, no swelling    Physical Exam  Vitals  Blood pressure 107/60, pulse 145, temperature 98.3 F (36.8 C), temperature source Oral, resp. rate 19, SpO2 96 %.   1. General elderly female lying in bed in NAD,    2. Normal affect and insight, Not Suicidal or Homicidal, Awake Alert, Oriented X 3.  3. No F.N deficits, ALL C.Nerves Intact, Strength 5/5 all 4 extremities, Sensation intact all 4 extremities, Plantars down going.  4. Ears and Eyes appear Normal, Conjunctivae clear, PERRLA. Moist Oral Mucosa.  5. Supple Neck, No JVD, No cervical lymphadenopathy appriciated, No Carotid Bruits.  6. Symmetrical Chest wall movement, Good air movement bilaterally, CTAB.  7. RRR, No Gallops, Rubs or Murmurs, No Parasternal Heave.  8. Positive Bowel Sounds, Abdomen Soft, No tenderness, No organomegaly appriciated,No rebound -guarding or rigidity.  9.  No Cyanosis, Normal Skin Turgor, No Skin Rash or Bruise.  10. Good muscle tone,  joints appear  normal , no effusions, Normal ROM.  11. No Palpable Lymph Nodes in Neck or Axillae    Data Review  CBC  Recent Labs Lab 08/25/14 1319  WBC 9.2  HGB 13.6  HCT 41.0  PLT 331  MCV 95.3  MCH 31.6  MCHC 33.2  RDW 11.8   ------------------------------------------------------------------------------------------------------------------  Chemistries   Recent Labs Lab 08/25/14 1319  NA 140  K 4.5  CL 99  CO2 26  GLUCOSE 111*  BUN 14  CREATININE 0.53  CALCIUM 10.0   ------------------------------------------------------------------------------------------------------------------ CrCl cannot be calculated (Unknown ideal weight.). ------------------------------------------------------------------------------------------------------------------ No results for input(s): TSH, T4TOTAL, T3FREE, THYROIDAB in the last 72 hours.  Invalid input(s): FREET3   Coagulation profile No results for input(s): INR, PROTIME in the last 168 hours. ------------------------------------------------------------------------------------------------------------------- No results for input(s): DDIMER in the last 72 hours. -------------------------------------------------------------------------------------------------------------------  Cardiac Enzymes No results for input(s): CKMB, TROPONINI, MYOGLOBIN in the last 168 hours.  Invalid input(s): CK ------------------------------------------------------------------------------------------------------------------ Invalid input(s): POCBNP   ---------------------------------------------------------------------------------------------------------------  Urinalysis    Component Value Date/Time   COLORURINE YELLOW 08/25/2014 1731   APPEARANCEUR CLEAR 08/25/2014 1731   LABSPEC 1.011 08/25/2014 1731   PHURINE 6.0 08/25/2014 1731   GLUCOSEU NEGATIVE 08/25/2014 1731   HGBUR TRACE* 08/25/2014 1731   BILIRUBINUR NEGATIVE 08/25/2014 1731   KETONESUR  NEGATIVE 08/25/2014 1731   PROTEINUR NEGATIVE 08/25/2014 1731   UROBILINOGEN 0.2 08/25/2014 1731   NITRITE NEGATIVE 08/25/2014 1731   LEUKOCYTESUR TRACE* 08/25/2014 1731    ----------------------------------------------------------------------------------------------------------------  Imaging results:   Dg Chest 2 View  08/25/2014   CLINICAL DATA:  Pain after falling 10 days previously. History of atrial fibrillation and intermittent difficulty breathing  EXAM: CHEST  2 VIEW  COMPARISON:  Thoracic spine series May 11, 2014  FINDINGS: There is mild scarring in the left base. There is no frank edema or consolidation. The heart size and pulmonary vascularity are normal. No adenopathy.  Anterior wedging of the T12 vertebral body is stable. There is mild anterior wedging of the L1 vertebral body which was not present on prior thoracic spine series.  IMPRESSION: Scarring left base. No edema or consolidation. Lower thoracic and upper lumbar compression fractures as described.   Electronically Signed   By: Bretta BangWilliam  Woodruff M.D.   On: 08/25/2014 14:36        Assessment & Plan  Principal Problem:   Weakness Active Problems:   Atrial fibrillation   Diabetes mellitus   OSA on CPAP   Acute bronchitis   UTI (lower urinary tract infection)   Fracture of lumbar spine   Weakness -Multifactorial due to infectious etiology from UTI and acute bronchitis, as well secondary to lower back pain from T12 compression fracture, no focal neurological finding, will consult PT.  UTI -Continue with ciprofloxacin  Acute bronchitis -Continue with levofloxacin  Diabetes mellitus -Continue with insulin sliding scale  Atrial fibrillation -Rate controlled without any medication, patient is not a candidate for anticoagulation given her multiple falls and recent subdural hematoma. -We'll check 2-D echo and monitor on telemetry given her elevated BNP  Thoracolumbar spine fracture Evident on chest  x-ray -Obtain MRI of thoracic and lumbar spine -Patient has no neurological deficits -PT consult, when necessary pain medicine  Obstructive sleep apnea -Continue with CPAP   DVT Prophylaxis Heparin - SCDs   AM Labs Ordered, also please review Full Orders  Family Communication: Admission, patients condition and plan of care including tests being ordered have been discussed with the patient and dsughter who indicate understanding and agree with the plan and Code Status.  Code Status Full  Likely DC to  home  Condition GUARDED    Time spent in minutes : 55 minutes.    Randol KernELGERGAWY, Parthiv Mucci M.D on 08/25/2014 at 6:23 PM  Between 7am to 7pm - Pager - [phone]  After 7pm go to [email or web address] - password [email or web address]  And look for the night coverage person covering me after hours  [facility] Office  [phone]   **Disclaimer: This note may have been dictated with voice recognition software. Similar sounding words can inadvertently be transcribed and this note may contain transcription errors which may not have been corrected upon publication of note.**

## 2014-08-26 DIAGNOSIS — I4891 Unspecified atrial fibrillation: Secondary | ICD-10-CM

## 2014-08-26 DIAGNOSIS — S32019A Unspecified fracture of first lumbar vertebra, initial encounter for closed fracture: Secondary | ICD-10-CM | POA: Diagnosis not present

## 2014-08-26 DIAGNOSIS — R0602 Shortness of breath: Secondary | ICD-10-CM | POA: Diagnosis not present

## 2014-08-26 DIAGNOSIS — S32009S Unspecified fracture of unspecified lumbar vertebra, sequela: Secondary | ICD-10-CM

## 2014-08-26 DIAGNOSIS — I059 Rheumatic mitral valve disease, unspecified: Secondary | ICD-10-CM

## 2014-08-26 LAB — BASIC METABOLIC PANEL
Anion gap: 15 (ref 5–15)
BUN: 13 mg/dL (ref 6–23)
CO2: 24 meq/L (ref 19–32)
Calcium: 9.3 mg/dL (ref 8.4–10.5)
Chloride: 102 mEq/L (ref 96–112)
Creatinine, Ser: 0.58 mg/dL (ref 0.50–1.10)
GFR calc Af Amer: >90 mL/min (ref >90)
GFR calc non Af Amer: 89 mL/min — ABNORMAL LOW (ref >90)
GLUCOSE: 113 mg/dL — AB (ref 70–99)
POTASSIUM: 5.2 meq/L (ref 3.7–5.3)
SODIUM: 141 meq/L (ref 137–147)

## 2014-08-26 LAB — GLUCOSE, CAPILLARY
GLUCOSE-CAPILLARY: 147 mg/dL — AB (ref 70–99)
GLUCOSE-CAPILLARY: 97 mg/dL (ref 70–99)
GLUCOSE-CAPILLARY: 110 mg/dL — AB (ref 70–99)
GLUCOSE-CAPILLARY: 127 mg/dL — AB (ref 70–99)

## 2014-08-26 LAB — CBC
HEMATOCRIT: 37.2 % (ref 36.0–46.0)
HEMOGLOBIN: 12.5 g/dL (ref 12.0–15.0)
MCH: 31.8 pg (ref 26.0–34.0)
MCHC: 33.6 g/dL (ref 30.0–36.0)
MCV: 94.7 fL (ref 78.0–100.0)
Platelets: 349 10*3/uL (ref 150–400)
RBC: 3.93 MIL/uL (ref 3.87–5.11)
RDW: 11.6 % (ref 11.5–15.5)
WBC: 8 10*3/uL (ref 4.0–10.5)

## 2014-08-26 MED ORDER — LEVOFLOXACIN 500 MG PO TABS: 500.0000 mg | ORAL_TABLET | Freq: Every day | ORAL | Status: DC

## 2014-08-26 MED ORDER — TRAMADOL HCL 50 MG PO TABS: 50.0000 mg | ORAL_TABLET | Freq: Three times a day (TID) | ORAL | Status: DC | PRN

## 2014-08-26 NOTE — Progress Notes (Signed)
PT Cancellation Note  Patient Details Name: Darlina RumpfHue Hakeem MRN: [id number] DOB: 01/08/1941   Cancelled Treatment:    Reason Eval/Treat Not Completed: Fatigue limiting ability to participate (Pt didn't get any rest last night and wants to rest right now.) Will try again later today.   Sweden Lesure 08/26/2014, 2:04 PM [facility] Kale[facility] H[facility] PT [phone]

## 2014-08-26 NOTE — Care Management Note (Addendum)
    Page 1 of 1   08/26/2014     4:08:02 PM CARE MANAGEMENT NOTE 08/26/2014  Patient:  [name],[name]   Account Number:  [id number]  Date Initiated:  08/26/2014  Documentation initiated by:  Donato SchultzHUTCHINSON,Omah Dewalt  Subjective/Objective Assessment:   UTI, Bronchitis infection , chf, back pain     Action/Plan:   CM to follow for disposition needs   Anticipated DC Date:  08/27/2014   Anticipated DC Plan:  HOME W HOME HEALTH SERVICES      DC Planning Services  CM consult      [facility]   Choice offered to / List presented to:  C-1 Patient        HH arranged  HH-2 PT      [facility]   Status of service:  Completed, signed off Medicare Important Message given?  NA - LOS <3 / Initial given by admissions (If response is "NO", the following Medicare IM given date fields will be blank) Date Medicare IM given:   Medicare IM given by:   Date Additional Medicare IM given:   Additional Medicare IM given by:    Discharge Disposition:  HOME W HOME HEALTH SERVICES  Per UR Regulation:  Reviewed for med. necessity/level of care/duration of stay  If discussed at Long Length of Stay Meetings, dates discussed:    Comments:  [name] RN, BSN, MSHL, CCM  Nurse - Case Manager,  (Unit [phone])  [phone]  08/26/2014 Dispo Plan:  Home with HHS: PT

## 2014-08-26 NOTE — Progress Notes (Signed)
Patient taken out via wheelchair for discharge.

## 2014-08-26 NOTE — Evaluation (Signed)
Physical Therapy Evaluation Patient Details Name: [name] MRN: [id number] DOB: [birth year] Today's Date: 08/26/2014   History of Present Illness  Pt adm after fall. Pt with T12 compression fx and mild L1 endplate fx.  Clinical Impression  Pt able to amb with cane with pain currently controlled. Family supportive. Recommend HHPT at DC. Pt to dc home today.      Follow Up Recommendations Home health PT;Supervision for mobility/OOB    Equipment Recommendations  None recommended by PT    Recommendations for Other Services       Precautions / Restrictions Precautions Precautions: Fall      Mobility  Bed Mobility Overal bed mobility: Modified Independent             General bed mobility comments: Verbal cues to roll to side prior to sitting up to decr strain on back.  Transfers Overall transfer level: Needs assistance Equipment used: Straight cane Transfers: Sit to/from Stand Sit to Stand: Supervision            Ambulation/Gait Ambulation/Gait assistance: Supervision Ambulation Distance (Feet): 120 Feet Assistive device: Straight cane Gait Pattern/deviations: Step-through pattern;Decreased step length - right;Decreased step length - left Gait velocity: decr Gait velocity interpretation: Below normal speed for age/gender General Gait Details: Guarded gait but no loss of balance  Stairs            Wheelchair Mobility    Modified Rankin (Stroke Patients Only)       Balance Overall balance assessment: History of Falls;Needs assistance Sitting-balance support: No upper extremity supported;Feet supported Sitting balance-Leahy Scale: Normal     Standing balance support: No upper extremity supported Standing balance-Leahy Scale: Fair                               Pertinent Vitals/Pain Pain Assessment: Faces Faces Pain Scale: Hurts a little bit Pain Location: back Pain Intervention(s): Limited activity within patient's  tolerance;Monitored during session;Repositioned    Home Living Family/patient expects to be discharged to:: Private residence Living Arrangements: Spouse/significant other;Other relatives Available Help at Discharge: Family;Available 24 hours/day Type of Home: House Home Access: Stairs to enter Entrance Stairs-Rails: None Entrance Stairs-Number of Steps: 2 Home Layout: Two level Home Equipment: Cane - single point      Prior Function Level of Independence: Independent with assistive device(s)         Comments: Amb with straight cane     Hand Dominance   Dominant Hand: Right    Extremity/Trunk Assessment   Upper Extremity Assessment: Overall WFL for tasks assessed           Lower Extremity Assessment: Overall WFL for tasks assessed         Communication   Communication: Prefers language other than English  Cognition Arousal/Alertness: Awake/alert Behavior During Therapy: WFL for tasks assessed/performed Overall Cognitive Status: Within Functional Limits for tasks assessed                      General Comments      Exercises        Assessment/Plan    PT Assessment All further PT needs can be met in the next venue of care  PT Diagnosis Acute pain;Difficulty walking   PT Problem List Decreased balance;Decreased mobility;Pain  PT Treatment Interventions     PT Goals (Current goals can be found in the Care Plan section) Acute Rehab PT Goals PT Goal Formulation: All assessment  and education complete, DC therapy    Frequency     Barriers to discharge        Co-evaluation               End of Session   Activity Tolerance: Patient tolerated treatment well Patient left: in bed;with call bell/phone within reach;with family/visitor present Nurse Communication: Mobility status    Functional Assessment Tool Used: clinical judgement Functional Limitation: Mobility: Walking and moving around Mobility: Walking and Moving Around Current  Status (G8185): At least 1 percent but less than 20 percent impaired, limited or restricted Mobility: Walking and Moving Around Goal Status [id number]): 0 percent impaired, limited or restricted Mobility: Walking and Moving Around Discharge Status [phone]): At least 1 percent but less than 20 percent impaired, limited or restricted    Time: 7858-8502 PT Time Calculation (min) (ACUTE ONLY): 6 min   Charges:   PT Evaluation $Initial PT Evaluation Tier I: 1 Procedure     PT G Codes:   Functional Assessment Tool Used: clinical judgement Functional Limitation: Mobility: Walking and moving around    [place] 08/26/2014, 4:26 PM

## 2014-08-26 NOTE — Progress Notes (Signed)
Home discharge instructions and discharge medications discussed with patient's daughter, who speaks [place]. Prescriptions for Levaquin given. Discussed diet and medications. Patient's daughter verbally understands instructions.

## 2014-08-26 NOTE — Progress Notes (Signed)
*  PRELIMINARY RESULTS* Echocardiogram 2D Echocardiogram has been performed.  Jeryl ColumbiaLLIOTT, Joscelyne [place] 08/26/2014, 11:49 AM

## 2014-08-26 NOTE — Progress Notes (Addendum)
Patient being discharged today. Family spoke with Dr.Rizwan regarding ortho MD being consult. Unsure if ortho will see patient. Daughter states she would like to wait a couple more hours to see if ortho MD will stop by before being discharged, although it was noted that Ortho has no recommendations at this time. Family aware. Cardiac monitor discontinued. CCMD notified.

## 2014-08-26 NOTE — Progress Notes (Signed)
UR completed Bear Osten K. Devean Skoczylas, RN, BSN, MSHL, CCM  08/26/2014 1:07 PM

## 2014-08-26 NOTE — Discharge Summary (Addendum)
Physician Discharge Summary  [facility] QMV:[id number] DOB: 02/18/1941 DOA: 08/25/2014  PCP: Gwenyth BouillonMILLMAN,FRANKLYN, MD  Admit date: 08/25/2014 Discharge date: 08/26/2014  Time spent: >35 minutes  Discharge Condition: stable Diet recommendation: heart healthy  Discharge Diagnoses:  Principal Problem:   Fracture of lumbar spine Active Problems:   Atrial fibrillation   Diabetes mellitus   OSA on CPAP   Weakness   Acute bronchitis   UTI (lower urinary tract infection)   History of present illness:  [name] is a 73 y.o. female,with history of atrial fibrillation not on anticoagulation secondary to multiple falls, presents to the hospital with multiple complaints - mainly generalized weakness, cough,fever, lower back pain. Patient reports she had fall on Halloween has had continued back pain. She has history of T12 compression fracture in the past. She was found to have a urinary tract infection. The Hospitalist group was requested to admit the patient for pain management, and treatment of her bronchitis and UTI.  Hospital Course:  Back pain -MRI reveals a mild compression fracture of L1 -The patient's pain is under control with tramadol -Discussed with ortho -currently no recommendations for hypoplasty or back brace - Ortho recommendations are to continue pain management- tramadol has been effective for her today  UTI -has been having symptoms of frequent micturition -UA mildly positive-no cultures sent-continue Levaquin  Acute bronchitis -Continue with levofloxacin - total 5 days  Diabetes mellitus -Continue with insulin sliding scale  Atrial fibrillation -Rate controlled without any medication, patient is not a candidate for anticoagulation given her multiple falls and recent subdural hematoma. -2-D echo reveals an EF of 60% mild to moderate mitral regurg, moderately dilated left atrium and mildly dilated right atrium  Obstructive sleep apnea -Continue with  CPAP  Procedures:  2-D echo  Consultations:  Ortho- phone consult  Discharge Exam: Filed Weights   08/25/14 2004 08/26/14 0610  Weight: 53.4 kg (117 lb 11.6 oz) 53.2 kg (117 lb 4.6 oz)   Filed Vitals:   08/26/14 0929  BP: 112/71  Pulse: 105  Temp: 98.4 F (36.9 C)  Resp: 16    General: AAO x 3, no distress Cardiovascular: RRR, no murmurs  Respiratory: clear to auscultation bilaterally GI: soft, non-tender, non-distended, bowel sound positive Musculoskeletal: Mild tenderness in T12-L1 area  Discharge Instructions You were cared for by a hospitalist during your hospital stay. If you have any questions about your discharge medications or the care you received while you were in the hospital after you are discharged, you can call the unit and asked to speak with the hospitalist on call if the hospitalist that took care of you is not available. Once you are discharged, your primary care physician will handle any further medical issues. Please note that NO REFILLS for any discharge medications will be authorized once you are discharged, as it is imperative that you return to your primary care physician (or establish a relationship with a primary care physician if you do not have one) for your aftercare needs so that they can reassess your need for medications and monitor your lab values.      Discharge Instructions    Diet - low sodium heart healthy    Complete by:  As directed      Increase activity slowly    Complete by:  As directed             Medication List    TAKE these medications        alendronate 70 MG tablet  Commonly known as:  FOSAMAX  Take 70 mg by mouth every Monday.     aspirin EC 81 MG tablet  Take 81 mg by mouth daily.     atenolol 25 MG tablet  Commonly known as:  TENORMIN  Take 12.5 mg by mouth daily.     b complex vitamins tablet  Take 1 tablet by mouth daily.     bacitracin ointment  Apply 1 application topically 2 (two) times daily.      CALTRATE 600 PLUS-VIT D PO  Take 1 tablet by mouth 2 (two) times daily.     chlorpheniramine-HYDROcodone 10-8 MG/5ML Lqcr  Commonly known as:  TUSSIONEX PENNKINETIC ER  Take 5 mLs by mouth every 12 (twelve) hours as needed for cough.     DAILY MULTIVITAMIN PO  Take 1 tablet by mouth daily.     DSS 100 MG Caps  Take 100 mg by mouth 2 (two) times daily.     fluticasone 50 MCG/ACT nasal spray  Commonly known as:  FLONASE  Place 1 spray into both nostrils daily as needed for allergies.     HYDROcodone-acetaminophen 5-325 MG per tablet  Commonly known as:  NORCO/VICODIN  Take 1 tablet by mouth every 6 (six) hours as needed for severe pain.     Krill Oil 1000 MG Caps  Take 1,000 mg by mouth 2 (two) times daily.     levofloxacin 500 MG tablet  Commonly known as:  LEVAQUIN  Take 1 tablet (500 mg total) by mouth daily.     metFORMIN 500 MG 24 hr tablet  Commonly known as:  GLUCOPHAGE-XR  Take 500 mg by mouth 2 (two) times daily.     montelukast 10 MG tablet  Commonly known as:  SINGULAIR  Take 10 mg by mouth at bedtime.     ondansetron 4 MG tablet  Commonly known as:  ZOFRAN  Take 1 tablet (4 mg total) by mouth every 8 (eight) hours as needed for nausea or vomiting.     pravastatin 10 MG tablet  Commonly known as:  PRAVACHOL  Take 10 mg by mouth daily.     traMADol 50 MG tablet  Commonly known as:  ULTRAM  Take 1 tablet (50 mg total) by mouth 2 (two) times daily.       Allergies  Allergen Reactions  . Banana Swelling    Hands and feet  . Chocolate Itching  . Peanut-Containing Drug Products Itching    Throat itches, no swelling      The results of significant diagnostics from this hospitalization (including imaging, microbiology, ancillary and laboratory) are listed below for reference.    Significant Diagnostic Studies: Dg Chest 2 View  08/25/2014   CLINICAL DATA:  Pain after falling 10 days previously. History of atrial fibrillation and intermittent  difficulty breathing  EXAM: CHEST  2 VIEW  COMPARISON:  Thoracic spine series May 11, 2014  FINDINGS: There is mild scarring in the left base. There is no frank edema or consolidation. The heart size and pulmonary vascularity are normal. No adenopathy.  Anterior wedging of the T12 vertebral body is stable. There is mild anterior wedging of the L1 vertebral body which was not present on prior thoracic spine series.  IMPRESSION: Scarring left base. No edema or consolidation. Lower thoracic and upper lumbar compression fractures as described.   Electronically Signed   By: Bretta Bang M.D.   On: 08/25/2014 14:36   Mr Thoracic Spine Wo Contrast  08/25/2014  CLINICAL DATA:  Fall 08/16/2014. Back pain. Leg weakness. History of T12 fracture  EXAM: MRI THORACIC AND LUMBAR SPINE WITHOUT CONTRAST  TECHNIQUE: Multiplanar and multiecho pulse sequences of the thoracic and lumbar spine were obtained without intravenous contrast.  COMPARISON:  Lumbar radiographs 05/11/2014  FINDINGS: MR THORACIC SPINE FINDINGS  Moderate to severe chronic compression fracture of T12 without significant bone marrow edema or mass lesion. Mild posterior osteophyte formation causing mild spinal stenosis at T11-12. No other thoracic fracture  Mild acute fracture L1 described below.  Thoracic spinal cord signal is normal. No cord compression or cord lesion. Negative for disc protrusion.  MR LUMBAR SPINE FINDINGS  Image quality degraded by motion. Mild fracture involving L1 inferior endplate with bone marrow edema. This appears acute and benign. No other lumbar fracture is identified. Conus medullaris is normal.  Mild lumbar disc and facet degeneration at L3-4, L4-5, L5-S1. Negative for disc protrusion. No significant spinal stenosis.  IMPRESSION: MR THORACIC SPINE IMPRESSION  Chronic compression T12 with mild spinal stenosis T11-T12 due to spurring  MR LUMBAR SPINE IMPRESSION  Mild acute fracture of L1 without spinal stenosis.   Electronically  Signed   By: Marlan Palau M.D.   On: 08/25/2014 20:10   Mr Lumbar Spine Wo Contrast  08/25/2014   CLINICAL DATA:  Fall 08/16/2014. Back pain. Leg weakness. History of T12 fracture  EXAM: MRI THORACIC AND LUMBAR SPINE WITHOUT CONTRAST  TECHNIQUE: Multiplanar and multiecho pulse sequences of the thoracic and lumbar spine were obtained without intravenous contrast.  COMPARISON:  Lumbar radiographs 05/11/2014  FINDINGS: MR THORACIC SPINE FINDINGS  Moderate to severe chronic compression fracture of T12 without significant bone marrow edema or mass lesion. Mild posterior osteophyte formation causing mild spinal stenosis at T11-12. No other thoracic fracture  Mild acute fracture L1 described below.  Thoracic spinal cord signal is normal. No cord compression or cord lesion. Negative for disc protrusion.  MR LUMBAR SPINE FINDINGS  Image quality degraded by motion. Mild fracture involving L1 inferior endplate with bone marrow edema. This appears acute and benign. No other lumbar fracture is identified. Conus medullaris is normal.  Mild lumbar disc and facet degeneration at L3-4, L4-5, L5-S1. Negative for disc protrusion. No significant spinal stenosis.  IMPRESSION: MR THORACIC SPINE IMPRESSION  Chronic compression T12 with mild spinal stenosis T11-T12 due to spurring  MR LUMBAR SPINE IMPRESSION  Mild acute fracture of L1 without spinal stenosis.   Electronically Signed   By: Marlan Palau M.D.   On: 08/25/2014 20:10    Microbiology: No results found for this or any previous visit (from the past 240 hour(s)).   Labs: Basic Metabolic Panel:  Recent Labs Lab 08/25/14 1319 08/26/14 0737  NA 140 141  K 4.5 5.2  CL 99 102  CO2 26 24  GLUCOSE 111* 113*  BUN 14 13  CREATININE 0.53 0.58  CALCIUM 10.0 9.3   Liver Function Tests: No results for input(s): AST, ALT, ALKPHOS, BILITOT, PROT, ALBUMIN in the last 168 hours. No results for input(s): LIPASE, AMYLASE in the last 168 hours. No results for input(s):  AMMONIA in the last 168 hours. CBC:  Recent Labs Lab 08/25/14 1319 08/26/14 0737  WBC 9.2 8.0  HGB 13.6 12.5  HCT 41.0 37.2  MCV 95.3 94.7  PLT 331 349   Cardiac Enzymes: No results for input(s): CKTOTAL, CKMB, CKMBINDEX, TROPONINI in the last 168 hours. BNP: BNP (last 3 results)  Recent Labs  08/25/14 1319  PROBNP 976.2*  CBG:  Recent Labs Lab 08/25/14 2329 08/26/14 0615 08/26/14 1102  GLUCAP 147* 97 110*       SignedCalvert Cantor:  Sagan Wurzel, MD [facility] 08/26/2014, 3:09 PM

## 2014-11-09 ENCOUNTER — Encounter (HOSPITAL_COMMUNITY): Payer: Self-pay | Admitting: Family Medicine

## 2014-11-09 ENCOUNTER — Inpatient Hospital Stay (HOSPITAL_COMMUNITY)
Admission: EM | Admit: 2014-11-09 | Discharge: 2014-11-11 | DRG: 871 | Disposition: A | Discharge status: Home / Self Care | Payer: Medicare Other | Attending: Internal Medicine | Admitting: Internal Medicine

## 2014-11-09 ENCOUNTER — Emergency Department (HOSPITAL_COMMUNITY): Payer: Medicare Other

## 2014-11-09 ENCOUNTER — Inpatient Hospital Stay (HOSPITAL_COMMUNITY): Payer: Medicare Other

## 2014-11-09 DIAGNOSIS — Z7983 Long term (current) use of bisphosphonates: Secondary | ICD-10-CM | POA: Diagnosis not present

## 2014-11-09 DIAGNOSIS — E781 Pure hyperglyceridemia: Secondary | ICD-10-CM | POA: Diagnosis present

## 2014-11-09 DIAGNOSIS — J159 Unspecified bacterial pneumonia: Secondary | ICD-10-CM | POA: Diagnosis present

## 2014-11-09 DIAGNOSIS — A419 Sepsis, unspecified organism: Secondary | ICD-10-CM | POA: Diagnosis present

## 2014-11-09 DIAGNOSIS — Z90710 Acquired absence of both cervix and uterus: Secondary | ICD-10-CM

## 2014-11-09 DIAGNOSIS — Z961 Presence of intraocular lens: Secondary | ICD-10-CM | POA: Diagnosis present

## 2014-11-09 DIAGNOSIS — J810 Acute pulmonary edema: Secondary | ICD-10-CM | POA: Diagnosis present

## 2014-11-09 DIAGNOSIS — Z7722 Contact with and (suspected) exposure to environmental tobacco smoke (acute) (chronic): Secondary | ICD-10-CM | POA: Diagnosis present

## 2014-11-09 DIAGNOSIS — Z9841 Cataract extraction status, right eye: Secondary | ICD-10-CM

## 2014-11-09 DIAGNOSIS — J80 Acute respiratory distress syndrome: Secondary | ICD-10-CM | POA: Diagnosis present

## 2014-11-09 DIAGNOSIS — M1990 Unspecified osteoarthritis, unspecified site: Secondary | ICD-10-CM | POA: Diagnosis present

## 2014-11-09 DIAGNOSIS — Z7982 Long term (current) use of aspirin: Secondary | ICD-10-CM

## 2014-11-09 DIAGNOSIS — R6520 Severe sepsis without septic shock: Secondary | ICD-10-CM | POA: Diagnosis present

## 2014-11-09 DIAGNOSIS — Z9181 History of falling: Secondary | ICD-10-CM | POA: Diagnosis not present

## 2014-11-09 DIAGNOSIS — R0602 Shortness of breath: Secondary | ICD-10-CM | POA: Diagnosis not present

## 2014-11-09 DIAGNOSIS — Y95 Nosocomial condition: Secondary | ICD-10-CM | POA: Diagnosis present

## 2014-11-09 DIAGNOSIS — Z91010 Allergy to peanuts: Secondary | ICD-10-CM

## 2014-11-09 DIAGNOSIS — J189 Pneumonia, unspecified organism: Secondary | ICD-10-CM | POA: Diagnosis present

## 2014-11-09 DIAGNOSIS — K219 Gastro-esophageal reflux disease without esophagitis: Secondary | ICD-10-CM | POA: Diagnosis present

## 2014-11-09 DIAGNOSIS — Z79899 Other long term (current) drug therapy: Secondary | ICD-10-CM

## 2014-11-09 DIAGNOSIS — E119 Type 2 diabetes mellitus without complications: Secondary | ICD-10-CM | POA: Diagnosis present

## 2014-11-09 DIAGNOSIS — I10 Essential (primary) hypertension: Secondary | ICD-10-CM | POA: Diagnosis present

## 2014-11-09 DIAGNOSIS — E872 Acidosis: Secondary | ICD-10-CM | POA: Diagnosis present

## 2014-11-09 DIAGNOSIS — Z9989 Dependence on other enabling machines and devices: Secondary | ICD-10-CM

## 2014-11-09 DIAGNOSIS — G4733 Obstructive sleep apnea (adult) (pediatric): Secondary | ICD-10-CM | POA: Diagnosis present

## 2014-11-09 DIAGNOSIS — I482 Chronic atrial fibrillation, unspecified: Secondary | ICD-10-CM | POA: Diagnosis present

## 2014-11-09 DIAGNOSIS — R05 Cough: Secondary | ICD-10-CM

## 2014-11-09 DIAGNOSIS — I4891 Unspecified atrial fibrillation: Secondary | ICD-10-CM | POA: Diagnosis present

## 2014-11-09 DIAGNOSIS — Z79891 Long term (current) use of opiate analgesic: Secondary | ICD-10-CM | POA: Diagnosis not present

## 2014-11-09 DIAGNOSIS — R059 Cough, unspecified: Secondary | ICD-10-CM

## 2014-11-09 DIAGNOSIS — Z9842 Cataract extraction status, left eye: Secondary | ICD-10-CM | POA: Diagnosis not present

## 2014-11-09 DIAGNOSIS — Z91018 Allergy to other foods: Secondary | ICD-10-CM | POA: Diagnosis not present

## 2014-11-09 DIAGNOSIS — E8770 Fluid overload, unspecified: Secondary | ICD-10-CM | POA: Diagnosis present

## 2014-11-09 LAB — CBC
HEMATOCRIT: 38.7 % (ref 36.0–46.0)
HEMOGLOBIN: 13 g/dL (ref 12.0–15.0)
MCH: 30.8 pg (ref 26.0–34.0)
MCHC: 33.6 g/dL (ref 30.0–36.0)
MCV: 91.7 fL (ref 78.0–100.0)
Platelets: 298 10*3/uL (ref 150–400)
RBC: 4.22 MIL/uL (ref 3.87–5.11)
RDW: 12 % (ref 11.5–15.5)
WBC: 8.3 10*3/uL (ref 4.0–10.5)

## 2014-11-09 LAB — URINALYSIS, ROUTINE W REFLEX MICROSCOPIC
BILIRUBIN URINE: NEGATIVE
GLUCOSE, UA: NEGATIVE mg/dL
HGB URINE DIPSTICK: NEGATIVE
Ketones, ur: NEGATIVE mg/dL
NITRITE: NEGATIVE
Protein, ur: NEGATIVE mg/dL
Specific Gravity, Urine: 1.009 (ref 1.005–1.030)
UROBILINOGEN UA: 0.2 mg/dL (ref 0.0–1.0)
pH: 5.5 (ref 5.0–8.0)

## 2014-11-09 LAB — BASIC METABOLIC PANEL
ANION GAP: 11 (ref 5–15)
BUN: 11 mg/dL (ref 6–23)
CALCIUM: 8.7 mg/dL (ref 8.4–10.5)
CO2: 22 mmol/L (ref 19–32)
Chloride: 99 mmol/L (ref 96–112)
Creatinine, Ser: 0.61 mg/dL (ref 0.50–1.10)
GFR calc Af Amer: >90 mL/min (ref >90)
GFR calc non Af Amer: 88 mL/min — ABNORMAL LOW (ref >90)
Glucose, Bld: 164 mg/dL — ABNORMAL HIGH (ref 70–99)
Potassium: 4 mmol/L (ref 3.5–5.1)
SODIUM: 132 mmol/L — AB (ref 135–145)

## 2014-11-09 LAB — I-STAT CG4 LACTIC ACID, ED
LACTIC ACID, VENOUS: 2.62 mmol/L — AB (ref 0.5–2.0)
LACTIC ACID, VENOUS: 2.62 mmol/L — AB (ref 0.5–2.0)

## 2014-11-09 LAB — STREP PNEUMONIAE URINARY ANTIGEN: Strep Pneumo Urinary Antigen: NEGATIVE

## 2014-11-09 LAB — GLUCOSE, CAPILLARY: Glucose-Capillary: 192 mg/dL — ABNORMAL HIGH (ref 70–99)

## 2014-11-09 LAB — URINE MICROSCOPIC-ADD ON

## 2014-11-09 LAB — MRSA PCR SCREENING: MRSA by PCR: NEGATIVE

## 2014-11-09 MED ORDER — HEPARIN SODIUM (PORCINE) 5000 UNIT/ML IJ SOLN
5000.0000 [IU] | Freq: Three times a day (TID) | INTRAMUSCULAR | Status: DC
  Administered 2014-11-09 – 2014-11-11 (×5): 5000 [IU] via SUBCUTANEOUS
  Filled 2014-11-09 (×9): qty 1

## 2014-11-09 MED ORDER — DEXTROSE 5 % IV SOLN: 1.0000 g | INTRAVENOUS | Status: DC

## 2014-11-09 MED ORDER — DEXTROSE 5 % IV SOLN
500.0000 mg | Freq: Once | INTRAVENOUS | Status: AC
  Administered 2014-11-09: 500 mg via INTRAVENOUS
  Filled 2014-11-09: qty 500

## 2014-11-09 MED ORDER — SODIUM CHLORIDE 0.9 % IJ SOLN
3.0000 mL | Freq: Two times a day (BID) | INTRAMUSCULAR | Status: DC
  Administered 2014-11-09 – 2014-11-11 (×4): 3 mL via INTRAVENOUS

## 2014-11-09 MED ORDER — ONDANSETRON HCL 4 MG PO TABS: 4.0000 mg | ORAL_TABLET | Freq: Four times a day (QID) | ORAL | Status: DC | PRN

## 2014-11-09 MED ORDER — IPRATROPIUM-ALBUTEROL 0.5-2.5 (3) MG/3ML IN SOLN
3.0000 mL | Freq: Once | RESPIRATORY_TRACT | Status: AC
  Administered 2014-11-09: 3 mL via RESPIRATORY_TRACT
  Filled 2014-11-09: qty 3

## 2014-11-09 MED ORDER — SODIUM CHLORIDE 0.9 % IV BOLUS (SEPSIS)
1000.0000 mL | INTRAVENOUS | Status: AC
  Administered 2014-11-09 (×2): 1000 mL via INTRAVENOUS

## 2014-11-09 MED ORDER — VANCOMYCIN HCL 500 MG IV SOLR
500.0000 mg | Freq: Two times a day (BID) | INTRAVENOUS | Status: DC
  Administered 2014-11-10 – 2014-11-11 (×3): 500 mg via INTRAVENOUS
  Filled 2014-11-09 (×5): qty 500

## 2014-11-09 MED ORDER — HYDROCOD POLST-CHLORPHEN POLST 10-8 MG/5ML PO LQCR
5.0000 mL | Freq: Once | ORAL | Status: AC
  Administered 2014-11-09: 5 mL via ORAL
  Filled 2014-11-09: qty 5

## 2014-11-09 MED ORDER — HYDROCOD POLST-CHLORPHEN POLST 10-8 MG/5ML PO LQCR
5.0000 mL | Freq: Two times a day (BID) | ORAL | Status: DC | PRN
  Administered 2014-11-10: 5 mL via ORAL
  Filled 2014-11-09: qty 5

## 2014-11-09 MED ORDER — ONDANSETRON HCL 4 MG/2ML IJ SOLN
4.0000 mg | Freq: Four times a day (QID) | INTRAMUSCULAR | Status: DC | PRN
Start: 2014-11-09 — End: 2014-11-11

## 2014-11-09 MED ORDER — MONTELUKAST SODIUM 10 MG PO TABS
10.0000 mg | ORAL_TABLET | Freq: Every day | ORAL | Status: DC
  Administered 2014-11-09 – 2014-11-10 (×2): 10 mg via ORAL
  Filled 2014-11-09 (×3): qty 1

## 2014-11-09 MED ORDER — DEXTROSE 5 % IV SOLN
500.0000 mg | INTRAVENOUS | Status: DC
  Filled 2014-11-09: qty 500

## 2014-11-09 MED ORDER — FUROSEMIDE 10 MG/ML IJ SOLN
40.0000 mg | Freq: Once | INTRAMUSCULAR | Status: AC
  Administered 2014-11-09: 40 mg via INTRAVENOUS
  Filled 2014-11-09: qty 4

## 2014-11-09 MED ORDER — INSULIN ASPART 100 UNIT/ML ~~LOC~~ SOLN
0.0000 [IU] | SUBCUTANEOUS | Status: DC
  Administered 2014-11-09: 2 [IU] via SUBCUTANEOUS
  Administered 2014-11-10: 1 [IU] via SUBCUTANEOUS

## 2014-11-09 MED ORDER — WHITE PETROLATUM GEL
Status: AC
  Administered 2014-11-09: 23:00:00
  Filled 2014-11-09: qty 1

## 2014-11-09 MED ORDER — DEXTROSE 5 % IV SOLN
1.0000 g | Freq: Once | INTRAVENOUS | Status: AC
  Administered 2014-11-09: 1 g via INTRAVENOUS
  Filled 2014-11-09: qty 10

## 2014-11-09 MED ORDER — ASPIRIN EC 81 MG PO TBEC
81.0000 mg | DELAYED_RELEASE_TABLET | Freq: Every day | ORAL | Status: DC
  Administered 2014-11-09 – 2014-11-11 (×3): 81 mg via ORAL
  Filled 2014-11-09 (×3): qty 1

## 2014-11-09 MED ORDER — ALBUTEROL (5 MG/ML) CONTINUOUS INHALATION SOLN
10.0000 mg/h | INHALATION_SOLUTION | RESPIRATORY_TRACT | Status: DC
  Administered 2014-11-09: 10 mg/h via RESPIRATORY_TRACT
  Filled 2014-11-09: qty 20

## 2014-11-09 MED ORDER — IPRATROPIUM-ALBUTEROL 0.5-2.5 (3) MG/3ML IN SOLN
3.0000 mL | Freq: Four times a day (QID) | RESPIRATORY_TRACT | Status: DC
  Filled 2014-11-09: qty 3

## 2014-11-09 MED ORDER — DEXTROSE 5 % IV SOLN
1.0000 g | INTRAVENOUS | Status: DC
  Administered 2014-11-09 – 2014-11-10 (×2): 1 g via INTRAVENOUS
  Filled 2014-11-09 (×3): qty 1

## 2014-11-09 NOTE — ED Notes (Signed)
cG-4 result reported to Dr. Alwyn RenSchmitt-Res

## 2014-11-09 NOTE — ED Notes (Signed)
Spoke with 6E nurse and he stated that they could not take a patient on a continuous NEB

## 2014-11-09 NOTE — ED Notes (Addendum)
Attempted to call report to [facility], Diplomatic Services operational officersecretary stated that the charge nurse would be taking report and would call back

## 2014-11-09 NOTE — H&P (Signed)
Date: 11/09/2014               Patient Name:  [name] MRN: [id number]  DOB: [birth year] Age / Sex: 74 y.o., female   PCP: [name], MD         Medical Service: Internal Medicine Teaching Service         Attending Physician: Dr. Earl Lagos, MD    First Contact: Dr. Eleonore Chiquito Pager: [phone]  Second Contact: Dr. Lauris Chroman Pager: [phone]       After Hours (After 5p/  First Contact Pager: [phone]  weekends / holidays): Second Contact Pager: [phone]   Chief Complaint: cough, fever, malaise  History of Present Illness: [name] is a 74 year old woman with atrial fibrillation not anticoagulated 2/2 multiple falls and subdural hematoma, DM2, OSA, GERD who presents with fever, cough, and malaise. History was obtained primarily with assistance from [name] who is a critical care nurse and lives with the patient. [name] was in her usual state of health until 1/21 when she developed malaise and had a home temperature of 102.4 as well as feeling like her heart is racing. On 1/22 she developed a dry cough in the evening with continued subjective fever and malaise. Yesterday this cough became productive of yellow sputum with associated chest heaviness and SOB and continued fevers. She said this morning she had a 8/10 right sided headache that improved with tramadol. Of note, she was hospitalized from 11/9-11/10/15 for multiple complaints including a UTI and acute bronchitis treated with levaquin.  In the ED, she received azithromycin 500 mg iv once, ceftriaxone 1 g iv once, ipratropium 3 mL neb once, albuterol neb continuous, tussionex 5 mL po once, and 2 L NS. She then developed acute respiratory distress. She was noted to be diaphoretic with labored breathing, had coarse crackles bilaterally and oxygen saturation down to 85%. She had a repeat CXR which demonstrated, compared to original CXR on presentation, central vascular congestion and central and medial lung base peribronchial  thickening. She was given lasix 40 mg iv once with improvement in symptoms.   Meds: Current Facility-Administered Medications  Medication Dose Route Frequency Provider Last Rate Last Dose  . aspirin EC tablet 81 mg  81 mg Oral Daily Genelle Gather, MD      . ceFEPIme (MAXIPIME) 1 g in dextrose 5 % 50 mL IVPB  1 g Intravenous Q24H Genelle Gather, MD      . chlorpheniramine-HYDROcodone (TUSSIONEX) 10-8 MG/5ML suspension 5 mL  5 mL Oral Q12H PRN Genelle Gather, MD      . heparin injection 5,000 Units  5,000 Units Subcutaneous 3 times per day Genelle Gather, MD      . insulin aspart (novoLOG) injection 0-9 Units  0-9 Units Subcutaneous 6 times per day Genelle Gather, MD      . ipratropium-albuterol (DUONEB) 0.5-2.5 (3) MG/3ML nebulizer solution 3 mL  3 mL Nebulization Q6H Genelle Gather, MD      . montelukast (SINGULAIR) tablet 10 mg  10 mg Oral QHS Genelle Gather, MD      . ondansetron [facility]) tablet 4 mg  4 mg Oral Q6H PRN Genelle Gather, MD       Or  . ondansetron [facility]) injection 4 mg  4 mg Intravenous Q6H PRN Genelle Gather, MD      . sodium chloride 0.9 % injection 3 mL  3 mL Intravenous Q12H Genelle Gather, MD      .  vancomycin (VANCOCIN) 500 mg in sodium chloride 0.9 % 100 mL IVPB  500 mg Intravenous Q12H Severiano Gilbert, [facility]        Allergies: Allergies as of 11/09/2014 - Review Complete 11/09/2014  Allergen Reaction Noted  . Banana Swelling 05/11/2014  . Chocolate Itching 05/11/2014  . Peanut-containing drug products Itching 05/11/2014   Past Medical History  Diagnosis Date  . Atrial fibrillation   . T12 compression fracture 2012  . Subdural hematoma july 2015    S/P fall while on Coumadin  . Hypertriglyceridemia   . Type II diabetes mellitus   . Pneumonia ~ 2000 X 1  . OSA on CPAP   . GERD (gastroesophageal reflux disease)   . Osteoarthritis     "knees, hands, back" (08/25/2014)  . Chronic back pain     "mid back down into lower back" (08/25/2014)   Past  Surgical History  Procedure Laterality Date  . Appendectomy  2012  . Total abdominal hysterectomy  1990  . Cataract extraction w/ intraocular lens  implant, bilateral Bilateral 2000's   Family History  Problem Relation Age of Onset  . Family history unknown: Yes   History   Social History  . Marital Status: Widowed    Spouse Name: N/A    Number of Children: N/A  . Years of Education: N/A   Occupational History  . Not on file.   Social History Main Topics  . Smoking status: Passive Smoke Exposure - Never Smoker  . Smokeless tobacco: Never Used  . Alcohol Use: No  . Drug Use: No  . Sexual Activity: No   Other Topics Concern  . Not on file   Social History Narrative    Review of Systems: A comprehensive review of systems was negative except for: as noted above per HPI  Physical Exam: Blood pressure 113/67, pulse 143, temperature 98.2 F (36.8 C), temperature source Oral, resp. rate 19, height  (1.499 m), weight 125 lb (56.7 kg), SpO2 98 %.  Gen: A&O x 4, no acute distress, well developed, well nourished HEENT: Atraumatic, PERRL, EOMI, sclerae anicteric, moist mucous membranes Heart: tachycardic and irregular, normal S1 S2, no murmurs, rubs, or gallops Lungs: Mild bibasilar crackles, expiratory crackles bilaterally and rhoncherous on R, respirations unlabored Abd: Soft, non-tender, non-distended, + bowel sounds, no hepatosplenomegaly Ext: No edema or cyanosis  Lab results: Basic Metabolic Panel:  Recent Labs  16/10/96 1605  NA 132*  K 4.0  CL 99  CO2 22  GLUCOSE 164*  BUN 11  CREATININE 0.61  CALCIUM 8.7   CBC:  Recent Labs  11/09/14 1605  WBC 8.3  HGB 13.0  HCT 38.7  MCV 91.7  PLT 298   Urinalysis:  Recent Labs  11/09/14 1632  COLORURINE YELLOW  LABSPEC 1.009  PHURINE 5.5  GLUCOSEU NEGATIVE  HGBUR NEGATIVE  BILIRUBINUR NEGATIVE  KETONESUR NEGATIVE  PROTEINUR NEGATIVE  UROBILINOGEN 0.2  NITRITE NEGATIVE  LEUKOCYTESUR TRACE*    few squamous epithelial cells  i-stat lactic acid 2.62  Imaging results:  Dg Chest 2 View  11/09/2014   CLINICAL DATA:  Chest pain  EXAM: CHEST  2 VIEW  COMPARISON:  08/25/2014  FINDINGS: Cardiac shadow is stable. The lungs are well aerated bilaterally with early infiltrate in the right lower lobe medially. No other focal infiltrate is seen. No bony abnormality is noted.  IMPRESSION: Right lower lobe infiltrate.   Electronically Signed   By: Alcide Clever M.D.   On: 11/09/2014 14:53  Dg Chest Portable 1 View  11/09/2014   CLINICAL DATA:  Labored breathing that started while in the ED.  EXAM: PORTABLE CHEST - 1 VIEW  COMPARISON:  11/09/2014  FINDINGS: Cardiac silhouette is mildly enlarged. Aorta is mildly uncoiled and tortuous. No convincing mediastinal or hilar mass.  There is central vascular congestion as well as central peribronchial interstitial thickening and medial lower lung patchy airspace opacity, greater on the right. This appears subtly increased from the prior exam. Findings may be infectious due to multifocal pneumonia. Interstitial asymmetric airspace edema should be considered in the proper clinical setting.  No pleural effusion or pneumothorax.  Bony thorax is demineralized but grossly intact.  IMPRESSION: 1. Mild worsening lung aeration from the earlier study. In addition to the right lower lobe infiltrate described previously, there is central vascular congestion and central and medial lung base peribronchial interstitial thickening. Possible etiologies include multifocal pneumonia and asymmetric edema.   Electronically Signed   By: Amie Portlandavid  Ormond M.D.   On: 11/09/2014 18:24    Other results: EKG: atrial fibrillation, no new t-wave inversions or ST changes compared to prior 08/25/14  Assessment & Plan by Problem: Principal Problem:   HCAP (healthcare-associated pneumonia) Active Problems:   Atrial fibrillation   Diabetes mellitus   OSA on CPAP  #Sepsis 2/2 HCAP: Lauren Audley HoseHong has  HCAP. She has a productive cough with RLL consolidation. She has 2/4 SIRS criteria (tachycardia, tachypnea) and reportedly fever at home as well as a lactic acidosis. She was started on iv antibiotics in the ED where she received iv azithromycin and ceftriaxone which is appropriate for CAP. However, she was hospitalized within 90 days and requires broader antibiotic coverage. Her [name] said she would prefer to not broaden antibiotics but we will and these can be narrowed by primary team. -vancomycin per pharmacy -cefepime per pharmacy -duoneb 3 mL neb q6h -tussionex 5 mL po q12hprn -BCx x 2 -sputum culture and gram stain -strep pneumo urine antigen -legionella pneumo urine antigen -respiratory virus panel -influenza panel -procalcitonin -HIV -BMP, CBC, lactic acid in am -peak expiratory flow bidprn -continuous pulse ox -cardiac monitoring  #Pulmonary edema: Lauren. Audley HoseHong received 2 L NS in the ED. She subsequently developed respiratory distress. On exam she was diaphoretic with labored breathing, had coarse crackles bilaterally and oxygen saturation down to 85%. She had a repeat CXR which demonstrated, compared to original CXR on presentation, central vascular congestion and central and medial lung base peribronchial thickening. She was then given lasix 40 mg IV once by the ED provider. She then reported improvement in her symptoms. At the time of our assessment, she still had mild bibasilar crackles but was breathing comfortably at 98% on 2 L [place]. -cont to monitor -continuous pulse ox -cardiac monitoring  #Atrial fibrillation in RVR: She is in atrial fibrillation on EKG. Her HR has been consistently in the 100s-150s since presentation. Her rapid ventricular rate may be multifactorial. This may be due to her infection. She also has received continuous nebulizer and was in respiratory distress as noted above. She is not anticoagulated as she has a history of multiple falls including one in 04/2014  while on warfarin that resulted in a subdural hematoma. When her continuous neb was held, her HR began going down to the 120s during our assessment. At home she is rate controlled with atenolol 12.5 mg daily and on ASA 81 mg daily. -monitor for now -if needed, will start rate control medicine -hold atenolol due to soft BP  as she is responding to held nebs  #DM2: Presenting blood sugar of 164. Last hemoglobin A1c in our record is 6.3 on 05/12/2014. At home she is on metformin 500 mg bid. -hold merformin -SSI-sensitive -CBG q4h  #Hyperlipidemia: No lipid panel in EMR. She is on pravastatin 10 mg daily at home. -hold pravastatin while NPO  #Diet: NPO for now in case need BiPAP  #DVT PPx: heparin 5000 u [place] tid  #Code: full  Dispo: Disposition is deferred at this time, awaiting improvement of current medical problems. Anticipated discharge in approximately 1 day(s).   The patient does have a current PCP [name], MD) and does need an [facility] hospital follow-up appointment after discharge.  The patient does not know have transportation limitations that hinder transportation to clinic appointments.  Signed: Lorenda Hatchet, MD 11/09/2014, 9:43 PM

## 2014-11-09 NOTE — ED Notes (Signed)
Dr Juleen ChinaKohut called new admitting Dr, pt plan is to go to stepdown.

## 2014-11-09 NOTE — ED Notes (Signed)
Second CG-4 resulte reported to Dr. Alwyn RenSchmitt-Res

## 2014-11-09 NOTE — ED Notes (Signed)
Pt attempting to use the bedside commode, pt and family informed that pt needed to stay in bed due to pt becoming sob and oxygen saturation decreasing to 86%. Pt and family verbalized understanding.

## 2014-11-09 NOTE — ED Notes (Signed)
Consulted with Dr Salley ScarletSchmit about pt's [phone]), he instructed to continue to watch pt's HR until albuterol wears off before action is taken.

## 2014-11-09 NOTE — ED Notes (Signed)
Pt states that she can't breathe well when sitting or lying, pt standing at the bedside.

## 2014-11-09 NOTE — ED Notes (Signed)
[facility], RN stated she would call me back.

## 2014-11-09 NOTE — ED Notes (Addendum)
Dr. Craige CottaSchmitt at bedside, pt diaphoretic and having labored breathing. Coarse crackles noted to bilateral lungs. Pt oxygen saturations noted to bd 98-99% on 2L. Pt axo x4.

## 2014-11-09 NOTE — ED Provider Notes (Signed)
CSN: [id number]     Arrival date & time 11/09/14  1332 History   First MD Initiated Contact with Patient 11/09/14 1543     Chief Complaint  Patient presents with  . Fever  . Weakness     (Consider location/radiation/quality/duration/timing/severity/associated sxs/prior Treatment) Patient is a 74 y.o. female presenting with fever. The history is provided by the patient and a relative. No language interpreter was used.  Fever Max temp prior to arrival:  102 Temp source:  Oral Onset quality:  Gradual Duration:  3 days Timing:  Constant Progression:  Worsening Chronicity:  New Relieved by:  Acetaminophen Worsened by:  Nothing tried Associated symptoms: chills, cough, myalgias and nausea   Associated symptoms: no chest pain, no confusion and no dysuria   Cough:    Cough characteristics:  Productive   Sputum characteristics:  Yellow   Severity:  Moderate   Onset quality:  Gradual   Duration:  1 day   Timing:  Constant   Progression:  Worsening   Chronicity:  New Nausea:    Severity:  Moderate   Onset quality:  Gradual   Timing:  Constant   Progression:  Unchanged   Past Medical History  Diagnosis Date  . Atrial fibrillation   . T12 compression fracture 2012  . Subdural hematoma july 2015    S/P fall while on Coumadin  . Hypertriglyceridemia   . Type II diabetes mellitus   . Pneumonia ~ 2000 X 1  . OSA on CPAP   . GERD (gastroesophageal reflux disease)   . Osteoarthritis     "knees, hands, back" (08/25/2014)  . Chronic back pain     "mid back down into lower back" (08/25/2014)   Past Surgical History  Procedure Laterality Date  . Appendectomy  2012  . Total abdominal hysterectomy  1990  . Cataract extraction w/ intraocular lens  implant, bilateral Bilateral 2000's   Family History  Problem Relation Age of Onset  . Family history unknown: Yes   History  Substance Use Topics  . Smoking status: Passive Smoke Exposure - Never Smoker  . Smokeless tobacco: Never  Used  . Alcohol Use: No   OB History    No data available     Review of Systems  Constitutional: Positive for fever and chills.  Respiratory: Positive for cough, shortness of breath and wheezing.   Cardiovascular: Negative for chest pain and leg swelling.  Gastrointestinal: Positive for nausea.  Genitourinary: Negative for dysuria.  Musculoskeletal: Positive for myalgias.  Psychiatric/Behavioral: Negative for confusion.  All other systems reviewed and are negative.     Allergies  Banana; Chocolate; and Peanut-containing drug products  Home Medications   Prior to Admission medications   Medication Sig Start Date End Date Taking? Authorizing Provider  alendronate (FOSAMAX) 70 MG tablet Take 70 mg by mouth every Monday.  04/30/14  Yes Historical Provider, MD  aspirin EC 81 MG tablet Take 81 mg by mouth daily.   Yes Historical Provider, MD  atenolol (TENORMIN) 25 MG tablet Take 12.5 mg by mouth daily.  03/15/14  Yes Historical Provider, MD  b complex vitamins tablet Take 1 tablet by mouth daily.   Yes Historical Provider, MD  Calcium-Vitamin D (CALTRATE 600 PLUS-VIT D PO) Take 1 tablet by mouth 2 (two) times daily.   Yes Historical Provider, MD  chlorpheniramine-HYDROcodone (TUSSIONEX PENNKINETIC ER) 10-8 MG/5ML LQCR Take 5 mLs by mouth every 12 (twelve) hours as needed for cough. 05/14/14  Yes Russella Dar, NP  docusate sodium 100 MG CAPS Take 100 mg by mouth 2 (two) times daily. Patient taking differently: Take 100 mg by mouth 2 (two) times daily as needed (constipation).  05/14/14  Yes Russella Dar, NP  fluticasone (FLONASE) 50 MCG/ACT nasal spray Place 1 spray into both nostrils daily as needed for allergies.  02/18/14  Yes Historical Provider, MD  HYDROcodone-acetaminophen (NORCO/VICODIN) 5-325 MG per tablet Take 1 tablet by mouth every 6 (six) hours as needed for severe pain.   Yes Historical Provider, MD  Boris Lown Oil 1000 MG CAPS Take 1,000 mg by mouth 2 (two) times daily.     Yes Historical Provider, MD  metFORMIN (GLUCOPHAGE-XR) 500 MG 24 hr tablet Take 500 mg by mouth 2 (two) times daily.  04/30/14  Yes Historical Provider, MD  montelukast (SINGULAIR) 10 MG tablet Take 10 mg by mouth at bedtime.  08/15/14  Yes Historical Provider, MD  Multiple Vitamin (DAILY MULTIVITAMIN PO) Take 1 tablet by mouth daily.   Yes Historical Provider, MD  pravastatin (PRAVACHOL) 10 MG tablet Take 10 mg by mouth daily. 04/30/14  Yes Historical Provider, MD  traMADol (ULTRAM) 50 MG tablet Take 1 tablet (50 mg total) by mouth 2 (two) times daily. Patient taking differently: Take 50 mg by mouth 2 (two) times daily as needed.  05/14/14  Yes Russella Dar, NP  bacitracin ointment Apply 1 application topically 2 (two) times daily. Patient taking differently: Apply 1 application topically as needed for wound care.  05/14/14   Russella Dar, NP  levofloxacin (LEVAQUIN) 500 MG tablet Take 1 tablet (500 mg total) by mouth daily. Patient not taking: Reported on 11/09/2014 08/26/14   Calvert Cantor, MD  ondansetron (ZOFRAN) 4 MG tablet Take 1 tablet (4 mg total) by mouth every 8 (eight) hours as needed for nausea or vomiting. 05/14/14   Russella Dar, NP   BP 110/73 mmHg  Pulse 89  Temp(Src) 98.9 F (37.2 C) (Oral)  Resp 20  Ht  (1.499 m)  Wt 123 lb 3.8 oz (55.9 kg)  BMI 24.88 kg/m2  SpO2 99% Physical Exam  Constitutional: She is oriented to person, place, and time. She appears well-developed and well-nourished. No distress.  HENT:  Head: Normocephalic and atraumatic.  Eyes: Pupils are equal, round, and reactive to light.  Neck: Normal range of motion.  Cardiovascular: Regular rhythm, normal heart sounds and intact distal pulses.  Tachycardia present.   Pulmonary/Chest: Effort normal. No respiratory distress. She has wheezes in the left middle field and the left lower field. She has rhonchi in the right middle field and the right lower field. She exhibits no tenderness.  Abdominal:  Soft. Bowel sounds are normal. She exhibits no distension. There is no tenderness. There is no rebound and no guarding.  Neurological: She is alert and oriented to person, place, and time. She has normal strength. No cranial nerve deficit or sensory deficit. She exhibits normal muscle tone. Coordination and gait normal.  Skin: Skin is warm and dry.  Nursing note and vitals reviewed.   ED Course  Procedures (including critical care time) Labs Review Labs Reviewed  BASIC METABOLIC PANEL - Abnormal; Notable for the following:    Sodium 132 (*)    Glucose, Bld 164 (*)    GFR calc non Af Amer 88 (*)    All other components within normal limits  URINALYSIS, ROUTINE W REFLEX MICROSCOPIC - Abnormal; Notable for the following:    Leukocytes, UA TRACE (*)  All other components within normal limits  URINE MICROSCOPIC-ADD ON - Abnormal; Notable for the following:    Squamous Epithelial / LPF FEW (*)    All other components within normal limits  BASIC METABOLIC PANEL - Abnormal; Notable for the following:    Potassium 3.4 (*)    Glucose, Bld 104 (*)    Calcium 8.0 (*)    All other components within normal limits  CBC - Abnormal; Notable for the following:    RBC 3.82 (*)    Hemoglobin 11.5 (*)    HCT 34.9 (*)    All other components within normal limits  GLUCOSE, CAPILLARY - Abnormal; Notable for the following:    Glucose-Capillary 192 (*)    All other components within normal limits  GLUCOSE, CAPILLARY - Abnormal; Notable for the following:    Glucose-Capillary 114 (*)    All other components within normal limits  GLUCOSE, CAPILLARY - Abnormal; Notable for the following:    Glucose-Capillary 101 (*)    All other components within normal limits  I-STAT CG4 LACTIC ACID, ED - Abnormal; Notable for the following:    Lactic Acid, Venous 2.62 (*)    All other components within normal limits  I-STAT CG4 LACTIC ACID, ED - Abnormal; Notable for the following:    Lactic Acid, Venous 2.62 (*)     All other components within normal limits  CULTURE, BLOOD (ROUTINE X 2)  CULTURE, BLOOD (ROUTINE X 2)  MRSA PCR SCREENING  CULTURE, EXPECTORATED SPUTUM-ASSESSMENT  URINE CULTURE  GRAM STAIN  RESPIRATORY VIRUS PANEL  CBC  STREP PNEUMONIAE URINARY ANTIGEN  INFLUENZA PANEL BY PCR (TYPE A & B, H1N1)  PROCALCITONIN  LACTIC ACID, PLASMA  GLUCOSE, CAPILLARY  HEMOGLOBIN A1C  HIV ANTIBODY (ROUTINE TESTING)  LEGIONELLA ANTIGEN, URINE    Imaging Review Dg Chest 2 View  11/09/2014   CLINICAL DATA:  Chest pain  EXAM: CHEST  2 VIEW  COMPARISON:  08/25/2014  FINDINGS: Cardiac shadow is stable. The lungs are well aerated bilaterally with early infiltrate in the right lower lobe medially. No other focal infiltrate is seen. No bony abnormality is noted.  IMPRESSION: Right lower lobe infiltrate.   Electronically Signed   By: Alcide Clever M.D.   On: 11/09/2014 14:53   Dg Chest Portable 1 View  11/09/2014   CLINICAL DATA:  Labored breathing that started while in the ED.  EXAM: PORTABLE CHEST - 1 VIEW  COMPARISON:  11/09/2014  FINDINGS: Cardiac silhouette is mildly enlarged. Aorta is mildly uncoiled and tortuous. No convincing mediastinal or hilar mass.  There is central vascular congestion as well as central peribronchial interstitial thickening and medial lower lung patchy airspace opacity, greater on the right. This appears subtly increased from the prior exam. Findings may be infectious due to multifocal pneumonia. Interstitial asymmetric airspace edema should be considered in the proper clinical setting.  No pleural effusion or pneumothorax.  Bony thorax is demineralized but grossly intact.  IMPRESSION: 1. Mild worsening lung aeration from the earlier study. In addition to the right lower lobe infiltrate described previously, there is central vascular congestion and central and medial lung base peribronchial interstitial thickening. Possible etiologies include multifocal pneumonia and asymmetric edema.    Electronically Signed   By: Amie Portland M.D.   On: 11/09/2014 18:24     EKG Interpretation None      MDM   Final diagnoses:  Shortness of breath  CAP (community acquired pneumonia)  Severe sepsis    Patient is  a 74 year old [place] female with pertinent past medical history of atrial fibrillation and hypertension who comes to the emergency department today with fevers, chills, shortness of breath, and productive cough for the past 2-3 days. Physical exam as above. With patient's crackles in the right lung and shortness of breath with fevers at home this is concerning for pneumonia. Patient is hypotensive here concerning for sepsis. She has no history of heart failure and has a recent echocardiogram from November with a normal EF as a result I feel that she requires aggressive fluid resuscitation with 2 L of fluid initially. The initial workup included a chest x-ray, UA, urine culture, lactic acid, CBC, BMP, blood culture 2, and an EKG. EKG demonstrated atrial fibrillation at a rate of 110.  I feel the patient's hypotension is likely from sepsis and not from A. fib with RVR as result will treated with fluids at this time and see if this will improve. UA was unremarkable. Lactic acid was elevated to 2.6. BMP was unremarkable. Chest x-ray demonstrated a right lower lobe infiltrate consistent with the patient's history and exam with a pneumonia. As result the patient was treated with azithromycin and Rocephin. Shortly after the 2 L of normal saline patient was lying flat in bed and became very short of breath as a result she sat up with desaturation down to 90% which resolved with 1 L of oxygen. Patient was reevaluated and had worsening crackles in her lungs as result a repeat chest x-ray was obtained. Patient's blood pressure is significantly improved at this time as result I do not feel that she requires further fluid resuscitation and will hold on further fluids at this time. Repeat CXR demonstrated  edema as a result patient was treated with 40 mg of lasix and started on a continuous duoneb for bilateral wheezing.  Patient experienced significant improvement in her shortness of breath after this treatment.  Shortly afterward patient developed tachycardia to 140.  The duoneb was held and we will continue to monitor the patient.  If she remains tachycardic after approximately an hour we will treat her tachycardia with calcium channel blockers.  Patient was felt to require admission to the hospital for pneumonia. She was admitted to the medicine service in a stable/critical condition. Labs and imaging reviewed by myself and considered in medical decision-making. Imaging was interpreted by radiology. Care was discussed with my attending Dr. Juleen ChinaKohut.     Bethann BerkshireAaron Dyllen Menning, MD 11/10/14 1141  Raeford RazorStephen Kohut, MD 11/17/14 [phone]

## 2014-11-09 NOTE — ED Notes (Signed)
Pt here for fever, cough, and weakness over the past few days. sts also severe HA.

## 2014-11-09 NOTE — Progress Notes (Signed)
ANTIBIOTIC CONSULT NOTE - INITIAL  Pharmacy Consult for vancomycin/cefepime Indication: HCAP  Allergies  Allergen Reactions  . Banana Swelling    Hands and feet  . Chocolate Itching  . Peanut-Containing Drug Products Itching    Throat itches, no swelling    Patient Measurements: Height: 4\' 11"  (149.9 cm) Weight: 125 lb (56.7 kg) IBW/kg (Calculated) : 43.2 Adjusted Body Weight:   Vital Signs: Temp: 98.2 F (36.8 C) (01/24 2052) Temp Source: Oral (01/24 2052) BP: 113/67 mmHg (01/24 2052) Pulse Rate: 143 (01/24 2015) Intake/Output from previous day:   Intake/Output from this shift: Total I/O In: -  Out: 1100 [Urine:1100]  Labs:  Recent Labs  11/09/14 1605  WBC 8.3  HGB 13.0  PLT 298  CREATININE 0.61   Estimated Creatinine Clearance: 48.1 mL/min (by C-G formula based on Cr of 0.61). No results for input(s): VANCOTROUGH, VANCOPEAK, VANCORANDOM, GENTTROUGH, GENTPEAK, GENTRANDOM, TOBRATROUGH, TOBRAPEAK, TOBRARND, AMIKACINPEAK, AMIKACINTROU, AMIKACIN in the last 72 hours.   Microbiology: No results found for this or any previous visit (from the past 720 hour(s)).  Medical History: Past Medical History  Diagnosis Date  . Atrial fibrillation   . T12 compression fracture 2012  . Subdural hematoma july 2015    S/P fall while on Coumadin  . Hypertriglyceridemia   . Type II diabetes mellitus   . Pneumonia ~ 2000 X 1  . OSA on CPAP   . GERD (gastroesophageal reflux disease)   . Osteoarthritis     "knees, hands, back" (08/25/2014)  . Chronic back pain     "mid back down into lower back" (08/25/2014)   Assessment: 74 year old woman with atrial fibrillation not anticoagulated 2/2 multiple falls and subdural hematoma, DM2, OSA, GERD who presents with shortness of breath, cough and RLL consolidation. Will start broad antibiotics for HCAP.    Currently afebrile with normal wbc of 8.3. Scr also normal at 0.6. Cultures ordered.  Goal of Therapy:  Vancomycin trough level  15-20 mcg/ml  Plan:  Measure antibiotic drug levels at steady state Follow up culture results Cefepime 1g IV 24 hours  Vancomycin 500mg  IV q12 hours  Sheppard CoilFrank Marleena Shubert PharmD., BCPS Clinical Pharmacist Pager [phone] 11/09/2014 9:02 PM

## 2014-11-10 DIAGNOSIS — E119 Type 2 diabetes mellitus without complications: Secondary | ICD-10-CM

## 2014-11-10 DIAGNOSIS — R000 Tachycardia, unspecified: Secondary | ICD-10-CM

## 2014-11-10 DIAGNOSIS — E785 Hyperlipidemia, unspecified: Secondary | ICD-10-CM

## 2014-11-10 DIAGNOSIS — I4891 Unspecified atrial fibrillation: Secondary | ICD-10-CM

## 2014-11-10 DIAGNOSIS — A419 Sepsis, unspecified organism: Principal | ICD-10-CM

## 2014-11-10 DIAGNOSIS — J811 Chronic pulmonary edema: Secondary | ICD-10-CM

## 2014-11-10 DIAGNOSIS — J189 Pneumonia, unspecified organism: Secondary | ICD-10-CM

## 2014-11-10 LAB — URINE CULTURE

## 2014-11-10 LAB — CBC
HEMATOCRIT: 34.9 % — AB (ref 36.0–46.0)
HEMOGLOBIN: 11.5 g/dL — AB (ref 12.0–15.0)
MCH: 30.1 pg (ref 26.0–34.0)
MCHC: 33 g/dL (ref 30.0–36.0)
MCV: 91.4 fL (ref 78.0–100.0)
PLATELETS: 307 10*3/uL (ref 150–400)
RBC: 3.82 MIL/uL — AB (ref 3.87–5.11)
RDW: 12.1 % (ref 11.5–15.5)
WBC: 7.9 10*3/uL (ref 4.0–10.5)

## 2014-11-10 LAB — BASIC METABOLIC PANEL
ANION GAP: 9 (ref 5–15)
BUN: 7 mg/dL (ref 6–23)
CHLORIDE: 104 mmol/L (ref 96–112)
CO2: 27 mmol/L (ref 19–32)
Calcium: 8 mg/dL — ABNORMAL LOW (ref 8.4–10.5)
Creatinine, Ser: 0.51 mg/dL (ref 0.50–1.10)
Glucose, Bld: 104 mg/dL — ABNORMAL HIGH (ref 70–99)
Potassium: 3.4 mmol/L — ABNORMAL LOW (ref 3.5–5.1)
SODIUM: 140 mmol/L (ref 135–145)

## 2014-11-10 LAB — GLUCOSE, CAPILLARY
GLUCOSE-CAPILLARY: 101 mg/dL — AB (ref 70–99)
GLUCOSE-CAPILLARY: 114 mg/dL — AB (ref 70–99)
GLUCOSE-CAPILLARY: 120 mg/dL — AB (ref 70–99)
Glucose-Capillary: 95 mg/dL (ref 70–99)
Glucose-Capillary: 110 mg/dL — ABNORMAL HIGH (ref 70–99)
Glucose-Capillary: 110 mg/dL — ABNORMAL HIGH (ref 70–99)

## 2014-11-10 LAB — PROCALCITONIN: Procalcitonin: <0.1 ng/mL

## 2014-11-10 LAB — EXPECTORATED SPUTUM ASSESSMENT W GRAM STAIN, RFLX TO RESP C

## 2014-11-10 LAB — EXPECTORATED SPUTUM ASSESSMENT W REFEX TO RESP CULTURE

## 2014-11-10 LAB — HEMOGLOBIN A1C
Hgb A1c MFr Bld: 6.3 % — ABNORMAL HIGH (ref <5.7)
Mean Plasma Glucose: 134 mg/dL — ABNORMAL HIGH (ref <117)

## 2014-11-10 LAB — LACTIC ACID, PLASMA: Lactic Acid, Venous: 1.9 mmol/L (ref 0.5–2.0)

## 2014-11-10 LAB — INFLUENZA PANEL BY PCR (TYPE A & B)
H1N1 flu by pcr: NOT DETECTED
Influenza A By PCR: NEGATIVE
Influenza B By PCR: NEGATIVE

## 2014-11-10 MED ORDER — LEVALBUTEROL HCL 0.63 MG/3ML IN NEBU
0.6300 mg | INHALATION_SOLUTION | Freq: Four times a day (QID) | RESPIRATORY_TRACT | Status: DC | PRN
  Administered 2014-11-11: 0.63 mg via RESPIRATORY_TRACT
  Filled 2014-11-10: qty 3

## 2014-11-10 MED ORDER — LEVALBUTEROL HCL 0.63 MG/3ML IN NEBU
0.6300 mg | INHALATION_SOLUTION | Freq: Three times a day (TID) | RESPIRATORY_TRACT | Status: DC
  Administered 2014-11-10: 0.63 mg via RESPIRATORY_TRACT
  Filled 2014-11-10 (×6): qty 3

## 2014-11-10 MED ORDER — INSULIN ASPART 100 UNIT/ML ~~LOC~~ SOLN: 0.0000 [IU] | Freq: Three times a day (TID) | SUBCUTANEOUS | Status: DC

## 2014-11-10 MED ORDER — TRAMADOL HCL 50 MG PO TABS
50.0000 mg | ORAL_TABLET | Freq: Once | ORAL | Status: AC
  Administered 2014-11-10: 50 mg via ORAL
  Filled 2014-11-10: qty 1

## 2014-11-10 MED ORDER — POTASSIUM CHLORIDE CRYS ER 20 MEQ PO TBCR
40.0000 meq | EXTENDED_RELEASE_TABLET | Freq: Once | ORAL | Status: AC
  Administered 2014-11-10: 40 meq via ORAL
  Filled 2014-11-10: qty 2

## 2014-11-10 MED ORDER — CETYLPYRIDINIUM CHLORIDE 0.05 % MT LIQD
7.0000 mL | Freq: Two times a day (BID) | OROMUCOSAL | Status: DC
  Administered 2014-11-10 – 2014-11-11 (×3): 7 mL via OROMUCOSAL

## 2014-11-10 MED ORDER — PRAVASTATIN SODIUM 10 MG PO TABS
10.0000 mg | ORAL_TABLET | Freq: Every day | ORAL | Status: DC
  Administered 2014-11-10: 10 mg via ORAL
  Filled 2014-11-10 (×2): qty 1

## 2014-11-10 MED ORDER — TRAMADOL HCL 50 MG PO TABS
50.0000 mg | ORAL_TABLET | Freq: Two times a day (BID) | ORAL | Status: DC
  Administered 2014-11-10 – 2014-11-11 (×3): 50 mg via ORAL
  Filled 2014-11-10 (×3): qty 1

## 2014-11-10 MED ORDER — HYDROCODONE-ACETAMINOPHEN 5-325 MG PO TABS
1.0000 | ORAL_TABLET | Freq: Four times a day (QID) | ORAL | Status: DC | PRN
  Administered 2014-11-10: 1 via ORAL
  Filled 2014-11-10: qty 1

## 2014-11-10 MED ORDER — IPRATROPIUM BROMIDE 0.02 % IN SOLN
0.5000 mg | Freq: Three times a day (TID) | RESPIRATORY_TRACT | Status: DC
  Administered 2014-11-10: 0.5 mg via RESPIRATORY_TRACT
  Filled 2014-11-10 (×3): qty 2.5

## 2014-11-10 NOTE — Progress Notes (Signed)
[facility]. Updated on patient history, current status, and plan of care.  Patient is stable and able to transfer at this time.

## 2014-11-10 NOTE — Progress Notes (Signed)
Notified MD that patients heart rate increased to 140. Central telemetry called and states that patient is not sustaining but HR has increased to 140s "a few times"

## 2014-11-10 NOTE — Progress Notes (Signed)
NURSING PROGRESS NOTE  Darlina RumpfHue Sabia [id number] Transfer Data: 11/10/2014 7:09 PM Attending Provider: Earl LagosNischal Narendra, MD WUJ:[id number]:MILLMAN,FRANKLYN, MD Code Status: FULL   Darlina RumpfHue Gorton is a 74 y.o. female patient transferred from 2C -No acute distress noted.  -No complaints of shortness of breath.  -No complaints of chest pain.   Cardiac Monitoring: Box # 2 in place. Cardiac monitor yields: atrial fibrillation  Blood pressure 108/82, pulse 107, temperature 98.7 F (37.1 C), temperature source Oral, resp. rate 16, height 4\' 11"  (1.499 m), weight 55.9 kg (123 lb 3.8 oz), SpO2 94 %.   IV Fluids:  IV in place, occlusive dsg intact without redness, IV cath forearm left, condition patent and no redness normal saline.   Allergies:  Banana; Chocolate; and Peanut-containing drug products  Past Medical History:   has a past medical history of Atrial fibrillation; T12 compression fracture (2012); Subdural hematoma (july 2015); Hypertriglyceridemia; Type II diabetes mellitus; Pneumonia (~ 2000 X 1); OSA on CPAP; GERD (gastroesophageal reflux disease); Osteoarthritis; and Chronic back pain.  Past Surgical History:   has past surgical history that includes Appendectomy (2012); Total abdominal hysterectomy (1990); and Cataract extraction w/ intraocular lens  implant, bilateral (Bilateral, 2000's).   Skin: intact  Patient/Family orientated to room. Information packet given to patient/family. Admission inpatient armband information verified with patient/family to include name and date of birth and placed on patient arm. Side rails up x 2, fall assessment and education completed with patient/family. Patient/family able to verbalize understanding of risk associated with falls and verbalized understanding to call for assistance before getting out of bed. Call light within reach. Patient/family able to voice and demonstrate understanding of unit orientation instructions.    Will continue to evaluate and treat per MD  orders.  Cathlyn Parsonsattha Kaycen Whitworth, RN

## 2014-11-10 NOTE — Progress Notes (Signed)
Subjective: Patient doing quite well this AM, not requiring supplemental O2, not coughing on exam. No fever, chills. Slightly tachycardic.   Objective: Vital signs in last 24 hours: Filed Vitals:   11/10/14 0823 11/10/14 0857 11/10/14 1231 11/10/14 1322  BP: 110/73  114/59 108/82  Pulse: 89  90 107  Temp: 98.9 F (37.2 C)  98.8 F (37.1 C) 98.7 F (37.1 C)  TempSrc: Oral  Oral Oral  Resp: 20  24 16   Height:      Weight:      SpO2: 100% 99% 99% 94%   Weight change:   Intake/Output Summary (Last 24 hours) at 11/10/14 1432 Last data filed at 11/10/14 1218  Gross per 24 hour  Intake   2453 ml  Output   3200 ml  Net   -747 ml   Physical Exam: General: Elderly [place] female, alert, cooperative, NAD. HEENT: PERRL, EOMI. Moist mucus membranes Neck: Full range of motion without pain, supple, no lymphadenopathy or carotid bruits Lungs: Normal work of respiration, no rales or rhonchi. Mild wheezes diffusely.  Heart: Tachycardic, irregular, no murmurs, gallops, or rubs Abdomen: Soft, non-tender, non-distended, BS + Extremities: No cyanosis, clubbing, or edema Neurologic: Alert & oriented X3, cranial nerves II-XII intact, strength grossly intact, sensation intact to light touch   Lab Results: Basic Metabolic Panel:  Recent Labs Lab 11/09/14 1605 11/10/14 0320  NA 132* 140  K 4.0 3.4*  CL 99 104  CO2 22 27  GLUCOSE 164* 104*  BUN 11 7  CREATININE 0.61 0.51  CALCIUM 8.7 8.0*   CBC:  Recent Labs Lab 11/09/14 1605 11/10/14 0320  WBC 8.3 7.9  HGB 13.0 11.5*  HCT 38.7 34.9*  MCV 91.7 91.4  PLT 298 307   CBG:  Recent Labs Lab 11/09/14 2059 11/10/14 0017 11/10/14 0354 11/10/14 0822 11/10/14 1230  GLUCAP 192* 114* 101* 95 110*   Hemoglobin A1C:  Recent Labs Lab 11/09/14 2140  HGBA1C 6.3*   Urinalysis:  Recent Labs Lab 11/09/14 1632  COLORURINE YELLOW  LABSPEC 1.009  PHURINE 5.5  GLUCOSEU NEGATIVE  HGBUR NEGATIVE  BILIRUBINUR NEGATIVE    KETONESUR NEGATIVE  PROTEINUR NEGATIVE  UROBILINOGEN 0.2  NITRITE NEGATIVE  LEUKOCYTESUR TRACE*    Micro Results: Recent Results (from the past 240 hour(s))  Blood Culture (routine x 2)     Status: None (Preliminary result)   Collection Time: 11/09/14  4:03 PM  Result Value Ref Range Status   Specimen Description BLOOD LEFT FOREARM  Final   Special Requests   Final    BOTTLES DRAWN AEROBIC AND ANAEROBIC 7CC AER 5CC ANA   Culture   Final           BLOOD CULTURE RECEIVED NO GROWTH TO DATE CULTURE WILL BE HELD FOR 5 DAYS BEFORE ISSUING A FINAL NEGATIVE REPORT [facility]formed at [facility]    Report Status PENDING  Incomplete  Blood Culture (routine x 2)     Status: None (Preliminary result)   Collection Time: 11/09/14  5:06 PM  Result Value Ref Range Status   Specimen Description BLOOD RIGHT FOREARM  Final   Special Requests BOTTLES DRAWN AEROBIC AND ANAEROBIC 5CC  Final   Culture   Final           BLOOD CULTURE RECEIVED NO GROWTH TO DATE CULTURE WILL BE HELD FOR 5 DAYS BEFORE ISSUING A FINAL NEGATIVE REPORT Performed at [facility]    Report Status PENDING  Incomplete  MRSA PCR Screening  Status: None   Collection Time: 11/09/14  8:51 PM  Result Value Ref Range Status   MRSA by PCR NEGATIVE NEGATIVE Final    Comment:        The GeneXpert MRSA Assay (FDA approved for NASAL specimens only), is one component of a comprehensive MRSA colonization surveillance program. It is not intended to diagnose MRSA infection nor to guide or monitor treatment for MRSA infections.   Culture, sputum-assessment     Status: None   Collection Time: 11/10/14  9:00 AM  Result Value Ref Range Status   Specimen Description SPUTUM  Final   Special Requests NONE  Final   Sputum evaluation   Final    MICROSCOPIC FINDINGS SUGGEST THAT THIS SPECIMEN IS NOT REPRESENTATIVE OF LOWER RESPIRATORY SECRETIONS. PLEASE RECOLLECT. Notified Shaw,S RN @ 1048 11/10/14 Leonard,A    Report  Status 11/10/2014 FINAL  Final    Studies/Results: Dg Chest 2 View  11/09/2014   CLINICAL DATA:  Chest pain  EXAM: CHEST  2 VIEW  COMPARISON:  08/25/2014  FINDINGS: Cardiac shadow is stable. The lungs are well aerated bilaterally with early infiltrate in the right lower lobe medially. No other focal infiltrate is seen. No bony abnormality is noted.  IMPRESSION: Right lower lobe infiltrate.   Electronically Signed   By: Alcide Clever M.D.   On: 11/09/2014 14:53   Dg Chest Portable 1 View  11/09/2014   CLINICAL DATA:  Labored breathing that started while in the ED.  EXAM: PORTABLE CHEST - 1 VIEW  COMPARISON:  11/09/2014  FINDINGS: Cardiac silhouette is mildly enlarged. Aorta is mildly uncoiled and tortuous. No convincing mediastinal or hilar mass.  There is central vascular congestion as well as central peribronchial interstitial thickening and medial lower lung patchy airspace opacity, greater on the right. This appears subtly increased from the prior exam. Findings may be infectious due to multifocal pneumonia. Interstitial asymmetric airspace edema should be considered in the proper clinical setting.  No pleural effusion or pneumothorax.  Bony thorax is demineralized but grossly intact.  IMPRESSION: 1. Mild worsening lung aeration from the earlier study. In addition to the right lower lobe infiltrate described previously, there is central vascular congestion and central and medial lung base peribronchial interstitial thickening. Possible etiologies include multifocal pneumonia and asymmetric edema.   Electronically Signed   By: Amie Portland M.D.   On: 11/09/2014 18:24   Medications: I have reviewed the patient's current medications. Scheduled Meds: . antiseptic oral rinse  7 mL Mouth Rinse BID  . aspirin EC  81 mg Oral Daily  . ceFEPime (MAXIPIME) IV  1 g Intravenous Q24H  . heparin  5,000 Units Subcutaneous 3 times per day  . insulin aspart  0-9 Units Subcutaneous 6 times per day  . ipratropium   0.5 mg Nebulization TID  . levalbuterol  0.63 mg Nebulization TID  . montelukast  10 mg Oral QHS  . potassium chloride  40 mEq Oral Once  . sodium chloride  3 mL Intravenous Q12H  . traMADol  50 mg Oral Q12H  . vancomycin  500 mg Intravenous Q12H   Continuous Infusions:  PRN Meds:.chlorpheniramine-HYDROcodone, levalbuterol, ondansetron **OR** ondansetron (ZOFRAN) IV   Assessment/Plan: 74 y/o F w/ PMHx of atrial fibrillation not on anti-coagulation (h/o falls), DM type II, OSA, GERD, and HLD, admitted for RLL HCAP.   HCAP: Admitted w/ complaints of productive cough, fever, chills. CXR showed RLL infiltrate. Patient was given 2L NS in ED and seemed to have resulting  volume overload, likely in the setting of flash pulmonary edema 2/2 rapid atrial fibrillation. Given lasix 40 mg IV w/ improvement in symptoms. Patient started on Rocephin + Azithromycin for presumed CAP, however, patient was admitted by hospitalist team in 08/2014. Changed antibiotic coverage to Vancomycin + Cefepime for HCAP coverage. Blood cultures x2, sputum culture, and gram stain pending. Strep pneumo urinary antigen negative, flu negative. Respiratory virus panel pending.  -Continue Vancomycin + Cefepime per pharmacy -Continue Atrovent + Xopenex nebs tid -Continue tussionex 5 mL po q12h prn -repeat CBC, BMP in AM   Atrial fibrillation w/ RVR: Only minimally tachycardic on exam this AM. SpO2 95% on room air. Rate rate controlled with atenolol 12.5 mg daily at home. Not on anti-coagulation d/t frequent falls.  -Continue cardiac monitoring.  -Restart Atenolol when BP can tolerate -Continue ASA  Pulmonary edema: Resolved. Most likely resulted 2/2 rapid atrial fibrillation. Given Lasix 40 mg IV in ED. Previous ECHO from 08/2014 shows no evidence of CHF.  -Consider repeat CXR in 1-2 days  DM Type II: CBG's as follows:   Recent Labs Lab 11/09/14 2059 11/10/14 0017 11/10/14 0354 11/10/14 0822 11/10/14 1230  GLUCAP 192*  114* 101* 95 110*  -Continue to hold home Metformin -Continue SSI-S   HLD: Stable.  -Restart Pravastatin 10 mg qhs  DVT/PE PPx: Heparin Kenneth  Dispo: Disposition is deferred at this time, awaiting improvement of current medical problems.  Anticipated discharge in approximately 1-2 day(s).   The patient does have a current PCP [name], MD) and does not need an [facility] hospital follow-up appointment after discharge.  The patient does not have transportation limitations that hinder transportation to clinic appointments.  .Services Needed at time of discharge: Y = Yes, Blank = No PT:   OT:   RN:   Equipment:   Other:     LOS: 1 day   Courtney Paris, MD 11/10/2014, 2:32 PM

## 2014-11-10 NOTE — Progress Notes (Signed)
Report received from Stephanie RN for patient to be transferred into [id number] 

## 2014-11-10 NOTE — Progress Notes (Signed)
Subjective: Patient's daughter interpreted for [place]. Patient has had a headache above her left forehead. She has had a productive cough with green sputum. Her daughter Lauren. [name] is hungry since she has not eaten since yesterday. Patient also reports that she experiences wheezing at home and would like to have an inhaler for these situations.  Objective: Vital signs in last 24 hours: Filed Vitals:   11/10/14 0353 11/10/14 0500 11/10/14 0823 11/10/14 0857  BP: 98/66  110/73   Pulse:   89   Temp: 99.4 F (37.4 C)  98.9 F (37.2 C)   TempSrc: Oral  Oral   Resp: 18  20   Height:      Weight:  55.9 kg (123 lb 3.8 oz)    SpO2: 98%  100% 99%   Weight change:   Intake/Output Summary (Last 24 hours) at 11/10/14 1150 Last data filed at 11/10/14 1030  Gross per 24 hour  Intake   2453 ml  Output   2950 ml  Net   -497 ml   Constitutional: Patient lying in bed in no acute distress and cooperative with exam Head: Normocephalic and atraumatic Nose: No erythema or drainage noted Mouth: No erythema or exudates, MMM Eyes: PERRL, EOMI, conjunctivae normal Pulmonary/Chest: normal respiratory effort on room air Neurological: Alert Skin: Warm, dry, and intact. No rash, cyanosis, or clubbing  Psychiatric: Normal mood and affect  Lab Results: Basic Metabolic Panel:  Recent Labs Lab 11/09/14 1605 11/10/14 0320  NA 132* 140  K 4.0 3.4*  CL 99 104  CO2 22 27  GLUCOSE 164* 104*  BUN 11 7  CREATININE 0.61 0.51  CALCIUM 8.7 8.0*   Liver Function Tests: No results for input(s): AST, ALT, ALKPHOS, BILITOT, PROT, ALBUMIN in the last 168 hours. No results for input(s): LIPASE, AMYLASE in the last 168 hours. No results for input(s): AMMONIA in the last 168 hours. CBC:  Recent Labs Lab 11/09/14 1605 11/10/14 0320  WBC 8.3 7.9  HGB 13.0 11.5*  HCT 38.7 34.9*  MCV 91.7 91.4  PLT 298 307   Cardiac Enzymes: No results for input(s): CKTOTAL, CKMB, CKMBINDEX, TROPONINI in the last 168  hours. BNP: No results for input(s): PROBNP in the last 168 hours. D-Dimer: No results for input(s): DDIMER in the last 168 hours. CBG:  Recent Labs Lab 11/09/14 2059 11/10/14 0017 11/10/14 0354 11/10/14 0822  GLUCAP 192* 114* 101* 95   Hemoglobin A1C: No results for input(s): HGBA1C in the last 168 hours. Fasting Lipid Panel: No results for input(s): CHOL, HDL, LDLCALC, TRIG, CHOLHDL, LDLDIRECT in the last 168 hours. Thyroid Function Tests: No results for input(s): TSH, T4TOTAL, FREET4, T3FREE, THYROIDAB in the last 168 hours. Coagulation: No results for input(s): LABPROT, INR in the last 168 hours. Anemia Panel: No results for input(s): VITAMINB12, FOLATE, FERRITIN, TIBC, IRON, RETICCTPCT in the last 168 hours. Urine Drug Screen: Drugs of Abuse  No results found for: LABOPIA, COCAINSCRNUR, LABBENZ, AMPHETMU, THCU, LABBARB  Alcohol Level: No results for input(s): ETH in the last 168 hours. Urinalysis:  Recent Labs Lab 11/09/14 1632  COLORURINE YELLOW  LABSPEC 1.009  PHURINE 5.5  GLUCOSEU NEGATIVE  HGBUR NEGATIVE  BILIRUBINUR NEGATIVE  KETONESUR NEGATIVE  PROTEINUR NEGATIVE  UROBILINOGEN 0.2  NITRITE NEGATIVE  LEUKOCYTESUR TRACE*    Micro Results: Recent Results (from the past 240 hour(s))  Blood Culture (routine x 2)     Status: None (Preliminary result)   Collection Time: 11/09/14  4:03 PM  Result Value  Ref Range Status   Specimen Description BLOOD LEFT FOREARM  Final   Special Requests   Final    BOTTLES DRAWN AEROBIC AND ANAEROBIC 7CC AER 5CC ANA   Culture   Final           BLOOD CULTURE RECEIVED NO GROWTH TO DATE CULTURE WILL BE HELD FOR 5 DAYS BEFORE ISSUING A FINAL NEGATIVE REPORT Performed at [facility]    Report Status PENDING  Incomplete  Blood Culture (routine x 2)     Status: None (Preliminary result)   Collection Time: 11/09/14  5:06 PM  Result Value Ref Range Status   Specimen Description BLOOD RIGHT FOREARM  Final   Special  Requests BOTTLES DRAWN AEROBIC AND ANAEROBIC 5CC  Final   Culture   Final           BLOOD CULTURE RECEIVED NO GROWTH TO DATE CULTURE WILL BE HELD FOR 5 DAYS BEFORE ISSUING A FINAL NEGATIVE REPORT Performed at [facility]    Report Status PENDING  Incomplete  MRSA PCR Screening     Status: None   Collection Time: 11/09/14  8:51 PM  Result Value Ref Range Status   MRSA by PCR NEGATIVE NEGATIVE Final    Comment:        The GeneXpert MRSA Assay (FDA approved for NASAL specimens only), is one component of a comprehensive MRSA colonization surveillance program. It is not intended to diagnose MRSA infection nor to guide or monitor treatment for MRSA infections.   Culture, sputum-assessment     Status: None   Collection Time: 11/10/14  9:00 AM  Result Value Ref Range Status   Specimen Description SPUTUM  Final   Special Requests NONE  Final   Sputum evaluation   Final    MICROSCOPIC FINDINGS SUGGEST THAT THIS SPECIMEN IS NOT REPRESENTATIVE OF LOWER RESPIRATORY SECRETIONS. PLEASE RECOLLECT. Notified Shaw,S RN @ 1048 11/10/14 Leonard,A    Report Status 11/10/2014 FINAL  Final   Studies/Results: Dg Chest 2 View  11/09/2014   CLINICAL DATA:  Chest pain  EXAM: CHEST  2 VIEW  COMPARISON:  08/25/2014  FINDINGS: Cardiac shadow is stable. The lungs are well aerated bilaterally with early infiltrate in the right lower lobe medially. No other focal infiltrate is seen. No bony abnormality is noted.  IMPRESSION: Right lower lobe infiltrate.   Electronically Signed   By: Alcide Clever M.D.   On: 11/09/2014 14:53   Dg Chest Portable 1 View  11/09/2014   CLINICAL DATA:  Labored breathing that started while in the ED.  EXAM: PORTABLE CHEST - 1 VIEW  COMPARISON:  11/09/2014  FINDINGS: Cardiac silhouette is mildly enlarged. Aorta is mildly uncoiled and tortuous. No convincing mediastinal or hilar mass.  There is central vascular congestion as well as central peribronchial interstitial thickening and  medial lower lung patchy airspace opacity, greater on the right. This appears subtly increased from the prior exam. Findings may be infectious due to multifocal pneumonia. Interstitial asymmetric airspace edema should be considered in the proper clinical setting.  No pleural effusion or pneumothorax.  Bony thorax is demineralized but grossly intact.  IMPRESSION: 1. Mild worsening lung aeration from the earlier study. In addition to the right lower lobe infiltrate described previously, there is central vascular congestion and central and medial lung base peribronchial interstitial thickening. Possible etiologies include multifocal pneumonia and asymmetric edema.   Electronically Signed   By: Amie Portland M.D.   On: 11/09/2014 18:24   Medications:  Scheduled Meds: . antiseptic oral rinse  7 mL Mouth Rinse BID  . aspirin EC  81 mg Oral Daily  . ceFEPime (MAXIPIME) IV  1 g Intravenous Q24H  . heparin  5,000 Units Subcutaneous 3 times per day  . insulin aspart  0-9 Units Subcutaneous 6 times per day  . ipratropium  0.5 mg Nebulization TID  . levalbuterol  0.63 mg Nebulization TID  . montelukast  10 mg Oral QHS  . potassium chloride  40 mEq Oral Once  . sodium chloride  3 mL Intravenous Q12H  . traMADol  50 mg Oral Q12H  . vancomycin  500 mg Intravenous Q12H   Continuous Infusions:  PRN Meds:.chlorpheniramine-HYDROcodone, levalbuterol, ondansetron **OR** ondansetron (ZOFRAN) IV  Assessment/Plan: Principal Problem:   HCAP (healthcare-associated pneumonia) Active Problems:   Atrial fibrillation   Diabetes mellitus   OSA on CPAP   [name] is a 74 y.o. female with PMH of atrial fibrillation not anticoagulated 2/2 multiple falls and subdural hematoma, DM2, OSA, GERD who presents with fever, cough, and malaise.   #Sepsis 2/2 HCAP: [name] has HCAP. She has a productive cough with RLL consolidation. She has 2/4 SIRS criteria (tachycardia, tachypnea) and reportedly fever at home as well as a  lactic acidosis. She was started on iv antibiotics in the ED where she received iv azithromycin and ceftriaxone which is appropriate for CAP. However, she was hospitalized within 90 days and requires broader antibiotic coverage. Her daughter said she would prefer to not broaden antibiotics but we will and these can be narrowed by primary team. -vancomycin per pharmacy -cefepime per pharmacy -anticipate switching to augmentin tomorrow -s/p duoneb 3 mL neb q6h -Continue ipratropium and levalbuterol neb 0.63 mg three times daily -tussionex 5 mL po q12hprn -BCx x 2: NGTD -sputum culture and gram stain: need to recollect -strep pneumo urine antigen: Negative -legionella pneumo urine antigen -respiratory virus panel -influenza panel: Negative -procalcitonin: <0.10 -HIV -BMP: K 3.4. Patient has received 40 mEq of Klor-con since then -CBC: WBC 7.9, Hgb 11.5 -lactic acid 1.9 -peak expiratory flow bidprn -continuous pulse ox: Sat at 99% on room air -cardiac monitoring bc patient has history of atrial fibrillation  #Pulmonary edema: [name] received 2 L NS in the ED. She subsequently developed respiratory distress. On exam she was diaphoretic with labored breathing, had coarse crackles bilaterally and oxygen saturation down to 85%. She had a repeat CXR which demonstrated, compared to original CXR on presentation, central vascular congestion and central and medial lung base peribronchial thickening. She was then given lasix 40 mg IV once by the ED provider. She then reported improvement in her symptoms. At the time of our assessment, she still had mild bibasilar crackles but was breathing comfortably at 98% on 2 L [place]. -cont to monitor -continuous pulse ox -cardiac monitoring  #Atrial fibrillation in RVR: She is in atrial fibrillation on EKG. Her HR has been consistently in the 100s-150s since presentation. Her rapid ventricular rate may be multifactorial. This may be due to her infection. She also  has received continuous nebulizer and was in respiratory distress as noted above. She is not anticoagulated as she has a history of multiple falls including one in 04/2014 while on warfarin that resulted in a subdural hematoma. When her continuous neb was held, her HR began going down to the 120s during our assessment. At home she is rate controlled with atenolol 12.5 mg daily and on ASA 81 mg daily. -monitor for now -if needed, will  start rate control medicine -hold atenolol due to blood pressure at 100s/50s  #DM2: Presenting blood sugar of 164. Last hemoglobin A1c in our record is 6.3 on 05/12/2014. At home she is on metformin 500 mg bid. -hold merformin -SSI-sensitive -CBG q4h  #Hyperlipidemia: No lipid panel in EMR. She is on pravastatin 10 mg daily at home. -hold pravastatin while NPO  #Diet: Regular  #DVT PPx: heparin 5000 u [place] tid  #Code: full  Dispo: Disposition is deferred at this time, awaiting improvement of current medical problems. Anticipated discharge in approximately 1 day(s).   The patient does have a current PCP [name](Franklyn Millman, MD) and [facility] after discharge.  The patient does not know have transportation limitations that hinder transportation to clinic appointments.  Services Needed at time of discharge: Y = Yes, Blank = No PT:   OT:   RN:   Equipment:   Other:      LOS: 1 day   This is a Psychologist, occupationalMedical Student Note. The care of the patient was discussed with Dr. Yetta BarreJones and the assessment and plan was formulated with their assistance. Please see their note for official documentation of the patient encounter.  Signed: Chiquita LothLina Carballo Abdulhamid Olgin, Med Student Internal Medicine Teaching Service Team B2  [phone] 11/10/2014, 11:50 AM

## 2014-11-10 NOTE — Progress Notes (Signed)
Attempted to call report to RN on 5W.  She was at lunch.  Number was taken for her to return my call when she is back on the unit.  Patient remains stable.  Will continue to monitor.

## 2014-11-10 NOTE — Progress Notes (Signed)
UR Completed.  [phone]  

## 2014-11-11 DIAGNOSIS — J810 Acute pulmonary edema: Secondary | ICD-10-CM

## 2014-11-11 DIAGNOSIS — Z7982 Long term (current) use of aspirin: Secondary | ICD-10-CM

## 2014-11-11 DIAGNOSIS — Y95 Nosocomial condition: Secondary | ICD-10-CM

## 2014-11-11 LAB — CBC WITH DIFFERENTIAL/PLATELET
BASOS PCT: 1 % (ref 0–1)
Basophils Absolute: 0 10*3/uL (ref 0.0–0.1)
EOS PCT: 7 % — AB (ref 0–5)
Eosinophils Absolute: 0.3 10*3/uL (ref 0.0–0.7)
HCT: 37.7 % (ref 36.0–46.0)
HEMOGLOBIN: 12.6 g/dL (ref 12.0–15.0)
Lymphocytes Relative: 25 % (ref 12–46)
Lymphs Abs: 1.1 10*3/uL (ref 0.7–4.0)
MCH: 30.6 pg (ref 26.0–34.0)
MCHC: 33.4 g/dL (ref 30.0–36.0)
MCV: 91.5 fL (ref 78.0–100.0)
MONO ABS: 0.8 10*3/uL (ref 0.1–1.0)
MONOS PCT: 19 % — AB (ref 3–12)
Neutro Abs: 2.1 10*3/uL (ref 1.7–7.7)
Neutrophils Relative %: 48 % (ref 43–77)
Platelets: 344 10*3/uL (ref 150–400)
RBC: 4.12 MIL/uL (ref 3.87–5.11)
RDW: 12.1 % (ref 11.5–15.5)
WBC: 4.4 10*3/uL (ref 4.0–10.5)

## 2014-11-11 LAB — LEGIONELLA ANTIGEN, URINE

## 2014-11-11 LAB — BASIC METABOLIC PANEL
Anion gap: 11 (ref 5–15)
BUN: 8 mg/dL (ref 6–23)
CHLORIDE: 108 mmol/L (ref 96–112)
CO2: 22 mmol/L (ref 19–32)
CREATININE: 0.55 mg/dL (ref 0.50–1.10)
Calcium: 9 mg/dL (ref 8.4–10.5)
GFR calc Af Amer: >90 mL/min (ref >90)
GFR calc non Af Amer: >90 mL/min (ref >90)
Glucose, Bld: 102 mg/dL — ABNORMAL HIGH (ref 70–99)
POTASSIUM: 4.1 mmol/L (ref 3.5–5.1)
Sodium: 141 mmol/L (ref 135–145)

## 2014-11-11 LAB — GLUCOSE, CAPILLARY: Glucose-Capillary: 109 mg/dL — ABNORMAL HIGH (ref 70–99)

## 2014-11-11 LAB — HIV ANTIBODY (ROUTINE TESTING W REFLEX): HIV-1/HIV-2 Ab: NONREACTIVE

## 2014-11-11 LAB — MAGNESIUM: MAGNESIUM: 2.3 mg/dL (ref 1.5–2.5)

## 2014-11-11 MED ORDER — LEVOFLOXACIN 750 MG PO TABS
750.0000 mg | ORAL_TABLET | ORAL | Status: DC
  Administered 2014-11-11: 750 mg via ORAL
  Filled 2014-11-11: qty 1

## 2014-11-11 MED ORDER — LEVOFLOXACIN 750 MG PO TABS: 750.0000 mg | ORAL_TABLET | Freq: Every day | ORAL | Status: DC

## 2014-11-11 MED ORDER — ALBUTEROL SULFATE HFA 108 (90 BASE) MCG/ACT IN AERS: 2.0000 | INHALATION_SPRAY | Freq: Four times a day (QID) | RESPIRATORY_TRACT | Status: DC | PRN

## 2014-11-11 MED ORDER — LEVOFLOXACIN 750 MG PO TABS: 750.0000 mg | ORAL_TABLET | ORAL | Status: DC

## 2014-11-11 MED ORDER — ATENOLOL 12.5 MG HALF TABLET
12.5000 mg | ORAL_TABLET | Freq: Every day | ORAL | Status: DC
  Administered 2014-11-11: 12.5 mg via ORAL
  Filled 2014-11-11: qty 1

## 2014-11-11 MED ORDER — HYDROCOD POLST-CHLORPHEN POLST 10-8 MG/5ML PO LQCR: 5.0000 mL | Freq: Two times a day (BID) | ORAL | Status: DC | PRN

## 2014-11-11 MED ORDER — HYDROCODONE-ACETAMINOPHEN 5-325 MG PO TABS: 1.0000 | ORAL_TABLET | Freq: Four times a day (QID) | ORAL | Status: DC | PRN

## 2014-11-11 NOTE — Progress Notes (Signed)
Patient refused to be escroted down by staff member at this time. Daughter, Lenn SinkHue states that she will walk with her mother (patient)

## 2014-11-11 NOTE — Progress Notes (Signed)
Subjective: Doing quite well this AM. Still w/ cough and RLL rhonchi. No fever, chills, or nausea. Still very mildly tachycardic.   Objective: Vital signs in last 24 hours: Filed Vitals:   11/10/14 1322 11/10/14 2141 11/11/14 0005 11/11/14 0517  BP: 108/82 110/56  107/77  Pulse: 107   86  Temp: 98.7 F (37.1 C) 98.1 F (36.7 C)  97.7 F (36.5 C)  TempSrc: Oral Oral  Oral  Resp: Height:      Weight:    120 lb 6.4 oz (54.613 kg)  SpO2: 94% 94% 97% 98%   Weight change: -4 lb 9.6 oz (-2.087 kg)  Intake/Output Summary (Last 24 hours) at 11/11/14 4098 Last data filed at 11/11/14 0531  Gross per 24 hour  Intake    583 ml  Output   1900 ml  Net  -1317 ml   Physical Exam: General: Elderly [place] female, alert, cooperative, NAD. HEENT: PERRL, EOMI. Moist mucus membranes Neck: Full range of motion without pain, supple, no lymphadenopathy or carotid bruits Lungs: Normal work of respiration, no rales or rhonchi. Mild wheezes in right lung.  Heart: Tachycardic, irregular, no murmurs, gallops, or rubs Abdomen: Soft, non-tender, non-distended, BS + Extremities: No cyanosis, clubbing, or edema Neurologic: Alert & oriented X3, cranial nerves II-XII intact, strength grossly intact, sensation intact to light touch   Lab Results: Basic Metabolic Panel:  Recent Labs Lab 11/09/14 1605 11/10/14 0320  NA 132* 140  K 4.0 3.4*  CL 99 104  CO2 22 27  GLUCOSE 164* 104*  BUN 11 7  CREATININE 0.61 0.51  CALCIUM 8.7 8.0*   CBC:  Recent Labs Lab 11/09/14 1605 11/10/14 0320  WBC 8.3 7.9  HGB 13.0 11.5*  HCT 38.7 34.9*  MCV 91.7 91.4  PLT 298 307   CBG:  Recent Labs Lab 11/10/14 0017 11/10/14 0354 11/10/14 0822 11/10/14 1230 11/10/14 1610 11/10/14 2145  GLUCAP 114* 101* 95 110* 110* 120*   Hemoglobin A1C:  Recent Labs Lab 11/09/14 2140  HGBA1C 6.3*   Urinalysis:  Recent Labs Lab 11/09/14 1632  COLORURINE YELLOW  LABSPEC 1.009  PHURINE 5.5    GLUCOSEU NEGATIVE  HGBUR NEGATIVE  BILIRUBINUR NEGATIVE  KETONESUR NEGATIVE  PROTEINUR NEGATIVE  UROBILINOGEN 0.2  NITRITE NEGATIVE  LEUKOCYTESUR TRACE*    Micro Results: Recent Results (from the past 240 hour(s))  Blood Culture (routine x 2)     Status: None (Preliminary result)   Collection Time: 11/09/14  4:03 PM  Result Value Ref Range Status   Specimen Description BLOOD LEFT FOREARM  Final   Special Requests   Final    BOTTLES DRAWN AEROBIC AND ANAEROBIC 7CC AER 5CC ANA   Culture   Final           BLOOD CULTURE RECEIVED NO GROWTH TO DATE CULTURE WILL BE HELD FOR 5 DAYS BEFORE ISSUING A FINAL NEGATIVE REPORT Performed at [facility]    Report Status PENDING  Incomplete  Urine culture     Status: None   Collection Time: 11/09/14  4:32 PM  Result Value Ref Range Status   Specimen Description URINE, RANDOM  Final   Special Requests NONE  Final   Colony Count   Final    2,000 COLONIES/ML Performed at [facility]    Culture   Final    INSIGNIFICANT GROWTH Performed at [facility]    Report Status 11/10/2014 FINAL  Final  Blood Culture (routine x  2)     Status: None (Preliminary result)   Collection Time: 11/09/14  5:06 PM  Result Value Ref Range Status   Specimen Description BLOOD RIGHT FOREARM  Final   Special Requests BOTTLES DRAWN AEROBIC AND ANAEROBIC 5CC  Final   Culture   Final           BLOOD CULTURE RECEIVED NO GROWTH TO DATE CULTURE WILL BE HELD FOR 5 DAYS BEFORE ISSUING A FINAL NEGATIVE REPORT [facility]    Report Status PENDING  Incomplete  MRSA PCR Screening     Status: None   Collection Time: 11/09/14  8:51 PM  Result Value Ref Range Status   MRSA by PCR NEGATIVE NEGATIVE Final    Comment:        The GeneXpert MRSA Assay (FDA approved for NASAL specimens only), is one component of a comprehensive MRSA colonization surveillance program. It is not intended to diagnose MRSA infection nor to guide  or monitor treatment for MRSA infections.   Culture, sputum-assessment     Status: None   Collection Time: 11/10/14  9:00 AM  Result Value Ref Range Status   Specimen Description SPUTUM  Final   Special Requests NONE  Final   Sputum evaluation   Final    MICROSCOPIC FINDINGS SUGGEST THAT THIS SPECIMEN IS NOT REPRESENTATIVE OF LOWER RESPIRATORY SECRETIONS. PLEASE RECOLLECT. Notified Shaw,S RN @ 1048 11/10/14 Leonard,A    Report Status 11/10/2014 FINAL  Final    Studies/Results: Dg Chest 2 View  11/09/2014   CLINICAL DATA:  Chest pain  EXAM: CHEST  2 VIEW  COMPARISON:  08/25/2014  FINDINGS: Cardiac shadow is stable. The lungs are well aerated bilaterally with early infiltrate in the right lower lobe medially. No other focal infiltrate is seen. No bony abnormality is noted.  IMPRESSION: Right lower lobe infiltrate.   Electronically Signed   By: Alcide CleverMark  Lukens M.D.   On: 11/09/2014 14:53   Dg Chest Portable 1 View  11/09/2014   CLINICAL DATA:  Labored breathing that started while in the ED.  EXAM: PORTABLE CHEST - 1 VIEW  COMPARISON:  11/09/2014  FINDINGS: Cardiac silhouette is mildly enlarged. Aorta is mildly uncoiled and tortuous. No convincing mediastinal or hilar mass.  There is central vascular congestion as well as central peribronchial interstitial thickening and medial lower lung patchy airspace opacity, greater on the right. This appears subtly increased from the prior exam. Findings may be infectious due to multifocal pneumonia. Interstitial asymmetric airspace edema should be considered in the proper clinical setting.  No pleural effusion or pneumothorax.  Bony thorax is demineralized but grossly intact.  IMPRESSION: 1. Mild worsening lung aeration from the earlier study. In addition to the right lower lobe infiltrate described previously, there is central vascular congestion and central and medial lung base peribronchial interstitial thickening. Possible etiologies include multifocal  pneumonia and asymmetric edema.   Electronically Signed   By: Amie Portlandavid  Ormond M.D.   On: 11/09/2014 18:24   Medications: I have reviewed the patient's current medications. Scheduled Meds: . antiseptic oral rinse  7 mL Mouth Rinse BID  . aspirin EC  81 mg Oral Daily  . ceFEPime (MAXIPIME) IV  1 g Intravenous Q24H  . heparin  5,000 Units Subcutaneous 3 times per day  . insulin aspart  0-9 Units Subcutaneous TID WC  . montelukast  10 mg Oral QHS  . pravastatin  10 mg Oral q1800  . sodium chloride  3 mL Intravenous Q12H  .  traMADol  50 mg Oral Q12H  . vancomycin  500 mg Intravenous Q12H   Continuous Infusions:  PRN Meds:.chlorpheniramine-HYDROcodone, HYDROcodone-acetaminophen, levalbuterol, ondansetron **OR** ondansetron (ZOFRAN) IV   Assessment/Plan: 74 y/o F w/ PMHx of atrial fibrillation not on anti-coagulation (h/o falls), DM type II, OSA, GERD, and HLD, admitted for RLL HCAP.   HCAP: Breathing improved significantly. Still w/ cough and mild wheeze on exam. No leukocytosis. Afebrile.  -Transition to Levaquin po 750 mg q48h for total of 10 days (end date 11/18/14) -Continue Atrovent + Xopenex nebs tid prn -Continue tussionex 5 mL po q12h prn  Atrial fibrillation w/ RVR: Rate 90-110. SpO2 95% on room air. Rate rate controlled with atenolol 12.5 mg daily at home. Not on anti-coagulation d/t frequent falls.  -Restart Atenolol when BP can tolerate -Continue ASA  Pulmonary edema: Resolved.  -Consider repeat CXR after discharge  DM Type II: CBG's as follows:   Recent Labs Lab 11/10/14 0354 11/10/14 0822 11/10/14 1230 11/10/14 1610 11/10/14 2145  GLUCAP 101* 95 110* 110* 120*  -Continue to hold home Metformin; restart on discharge.  -Continue SSI-S   HLD: Stable.  -Restart Pravastatin 10 mg qhs  DVT/PE PPx: Heparin [place]  Dispo: Anticipated discharge in approximately 1-2 day(s).   The patient does have a current PCP [name], MD) and does not need an [facility] hospital  follow-up appointment after discharge.  The patient does not have transportation limitations that hinder transportation to clinic appointments.  .Services Needed at time of discharge: Y = Yes, Blank = No PT:   OT:   RN:   Equipment:   Other:     LOS: 2 days   Courtney Paris, MD 11/11/2014, 7:22 AM

## 2014-11-11 NOTE — Progress Notes (Signed)
Pt seen and examined with Dr. Yetta BarreJones. Case d/w residents in detail on morning rounds. Please refer to resident note for details.  Pt feels well this AM. SOB has improved but still with persistent cough  Physical Exam: Gen: AAO*3, NAD CVS: tachycardic, irregularly irregular Lungs: R sided wheezes + Abd: soft, non tender, BS + Ext: no edema  Assessment and Plan: 74 y/o female p/w fevers, cough and found to have HCAP  HCAP: - CXR with R LL infiltrate and patient with fevers and cough consistent with acute bacterial PNA - d/c vanco and cefipime. Will start PO levaquin to complete 10 day course - Blood cx with NGTD.  - c/w nebs and tussinex - No leukocytosis. Will monitor - flu panel negative.  Afib with rvr: -Restart atenolol today - HR improving - c/w asa  Pulm edema: - likely secondary to afib with RVR. Now resolved. Will monitor off diuretics  DM: - Will monitor on SSI - Resume metformin on d/c  Pt stable for d/c home today

## 2014-11-11 NOTE — Progress Notes (Signed)
Subjective: Patient's daughter served as an Equities trader. Ms. Valdes denies having any fever/chills or required O2 supplementation overnight. Tussinex helped with her cough. She had a severe headache yesterday associated with coughing that improved after she received a dose of Norco.   Of note, patient had an episode of tachycardia to 140s yesterday at around 5 pm and patient continued to have intermittent tachycardia that resolved spontaneously. Telemetry revealed two episodes of atrial fibrillation at 5:46 am and 8:12 am today.  Objective: Vital signs in last 24 hours: Filed Vitals:   11/10/14 1322 11/10/14 2141 11/11/14 0005 11/11/14 0517  BP: 108/82 110/56  107/77  Pulse: 107   86  Temp: 98.7 F (37.1 C) 98.1 F (36.7 C)  97.7 F (36.5 C)  TempSrc: Oral Oral  Oral  Resp: Height:      Weight:    54.613 kg (120 lb 6.4 oz)  SpO2: 94% 94% 97% 98%   Weight change: -2.087 kg (-4 lb 9.6 oz)  Intake/Output Summary (Last 24 hours) at 11/11/14 1120 Last data filed at 11/11/14 0836  Gross per 24 hour  Intake    580 ml  Output   2050 ml  Net  -1470 ml   Constitutional: Patient lying in bed in no acute distress, alert, and cooperative with exam Head: Normocephalic and atraumatic. Mildly tender to palpation over right temple/forehead where she usually has headaches Nose: No erythema or drainage noted Mouth: No erythema or exudates, MMM Eyes: Pinpoint pupils, EOMI, conjunctivae normal Cardiovascular: RRR, S1 normal, S2 normal, no murmurs, rubs, or gallops. Radial pulse 2+ Pulmonary/Chest: normal respiratory effort, mild expiratory wheezes over right lower lobe, rhonci over anterior fields bilaterally Abdominal: Soft, non-tender, non-distended, bowel sounds present Skin: Warm, dry, and intact. No rash, cyanosis, or clubbing  Psychiatric: Normal mood and affect  Lab Results: Basic Metabolic Panel:  Recent Labs Lab 11/10/14 0320 11/11/14 0725  NA 140 141  K 3.4* 4.1    CL 104 108  CO2 27 22  GLUCOSE 104* 102*  BUN 7 8  CREATININE 0.51 0.55  CALCIUM 8.0* 9.0  MG  --  2.3   Liver Function Tests: No results for input(s): AST, ALT, ALKPHOS, BILITOT, PROT, ALBUMIN in the last 168 hours. No results for input(s): LIPASE, AMYLASE in the last 168 hours. No results for input(s): AMMONIA in the last 168 hours. CBC:  Recent Labs Lab 11/10/14 0320 11/11/14 0725  WBC 7.9 4.4  NEUTROABS  --  2.1  HGB 11.5* 12.6  HCT 34.9* 37.7  MCV 91.4 91.5  PLT 307 344   Cardiac Enzymes: No results for input(s): CKTOTAL, CKMB, CKMBINDEX, TROPONINI in the last 168 hours. BNP: No results for input(s): PROBNP in the last 168 hours. D-Dimer: No results for input(s): DDIMER in the last 168 hours. CBG:  Recent Labs Lab 11/10/14 0354 11/10/14 0822 11/10/14 1230 11/10/14 1610 11/10/14 2145 11/11/14 0831  GLUCAP 101* 95 110* 110* 120* 109*   Hemoglobin A1C:  Recent Labs Lab 11/09/14 2140  HGBA1C 6.3*   Fasting Lipid Panel: No results for input(s): CHOL, HDL, LDLCALC, TRIG, CHOLHDL, LDLDIRECT in the last 168 hours. Thyroid Function Tests: No results for input(s): TSH, T4TOTAL, FREET4, T3FREE, THYROIDAB in the last 168 hours. Coagulation: No results for input(s): LABPROT, INR in the last 168 hours. Anemia Panel: No results for input(s): VITAMINB12, FOLATE, FERRITIN, TIBC, IRON, RETICCTPCT in the last 168 hours. Urine Drug Screen: Drugs of Abuse  No results found for:  LABOPIA, COCAINSCRNUR, LABBENZ, AMPHETMU, THCU, LABBARB  Alcohol Level: No results for input(s): ETH in the last 168 hours. Urinalysis:  Recent Labs Lab 11/09/14 1632  COLORURINE YELLOW  LABSPEC 1.009  PHURINE 5.5  GLUCOSEU NEGATIVE  HGBUR NEGATIVE  BILIRUBINUR NEGATIVE  KETONESUR NEGATIVE  PROTEINUR NEGATIVE  UROBILINOGEN 0.2  NITRITE NEGATIVE  LEUKOCYTESUR TRACE*   Micro Results: Recent Results (from the past 240 hour(s))  Blood Culture (routine x 2)     Status: None  (Preliminary result)   Collection Time: 11/09/14  4:03 PM  Result Value Ref Range Status   Specimen Description BLOOD LEFT FOREARM  Final   Special Requests   Final    BOTTLES DRAWN AEROBIC AND ANAEROBIC 7CC AER 5CC ANA   Culture   Final           BLOOD CULTURE RECEIVED NO GROWTH TO DATE CULTURE WILL BE HELD FOR 5 DAYS BEFORE ISSUING A FINAL NEGATIVE REPORT Performed at [facility]    Report Status PENDING  Incomplete  Urine culture     Status: None   Collection Time: 11/09/14  4:32 PM  Result Value Ref Range Status   Specimen Description URINE, RANDOM  Final   Special Requests NONE  Final   Colony Count   Final    2,000 COLONIES/ML Performed at [facility]    Culture   Final    INSIGNIFICANT GROWTH Performed at [facility]    Report Status 11/10/2014 FINAL  Final  Blood Culture (routine x 2)     Status: None (Preliminary result)   Collection Time: 11/09/14  5:06 PM  Result Value Ref Range Status   Specimen Description BLOOD RIGHT FOREARM  Final   Special Requests BOTTLES DRAWN AEROBIC AND ANAEROBIC 5CC  Final   Culture   Final           BLOOD CULTURE RECEIVED NO GROWTH TO DATE CULTURE WILL BE HELD FOR 5 DAYS BEFORE ISSUING A FINAL NEGATIVE REPORT Performed at [facility]    Report Status PENDING  Incomplete  MRSA PCR Screening     Status: None   Collection Time: 11/09/14  8:51 PM  Result Value Ref Range Status   MRSA by PCR NEGATIVE NEGATIVE Final    Comment:        The GeneXpert MRSA Assay (FDA approved for NASAL specimens only), is one component of a comprehensive MRSA colonization surveillance program. It is not intended to diagnose MRSA infection nor to guide or monitor treatment for MRSA infections.   Culture, sputum-assessment     Status: None   Collection Time: 11/10/14  9:00 AM  Result Value Ref Range Status   Specimen Description SPUTUM  Final   Special Requests NONE  Final   Sputum evaluation   Final    MICROSCOPIC  FINDINGS SUGGEST THAT THIS SPECIMEN IS NOT REPRESENTATIVE OF LOWER RESPIRATORY SECRETIONS. PLEASE RECOLLECT. Notified Shaw,S RN @ 1048 11/10/14 Leonard,A    Report Status 11/10/2014 FINAL  Final   Studies/Results: Dg Chest 2 View  11/09/2014   CLINICAL DATA:  Chest pain  EXAM: CHEST  2 VIEW  COMPARISON:  08/25/2014  FINDINGS: Cardiac shadow is stable. The lungs are well aerated bilaterally with early infiltrate in the right lower lobe medially. No other focal infiltrate is seen. No bony abnormality is noted.  IMPRESSION: Right lower lobe infiltrate.   Electronically Signed   By: Alcide Clever M.D.   On: 11/09/2014 14:53   Dg Chest Portable  1 View  11/09/2014   CLINICAL DATA:  Labored breathing that started while in the ED.  EXAM: PORTABLE CHEST - 1 VIEW  COMPARISON:  11/09/2014  FINDINGS: Cardiac silhouette is mildly enlarged. Aorta is mildly uncoiled and tortuous. No convincing mediastinal or hilar mass.  There is central vascular congestion as well as central peribronchial interstitial thickening and medial lower lung patchy airspace opacity, greater on the right. This appears subtly increased from the prior exam. Findings may be infectious due to multifocal pneumonia. Interstitial asymmetric airspace edema should be considered in the proper clinical setting.  No pleural effusion or pneumothorax.  Bony thorax is demineralized but grossly intact.  IMPRESSION: 1. Mild worsening lung aeration from the earlier study. In addition to the right lower lobe infiltrate described previously, there is central vascular congestion and central and medial lung base peribronchial interstitial thickening. Possible etiologies include multifocal pneumonia and asymmetric edema.   Electronically Signed   By: Amie Portlandavid  Ormond M.D.   On: 11/09/2014 18:24   Medications:  Scheduled Meds: . antiseptic oral rinse  7 mL Mouth Rinse BID  . aspirin EC  81 mg Oral Daily  . ceFEPime (MAXIPIME) IV  1 g Intravenous Q24H  . heparin   5,000 Units Subcutaneous 3 times per day  . insulin aspart  0-9 Units Subcutaneous TID WC  . montelukast  10 mg Oral QHS  . pravastatin  10 mg Oral q1800  . sodium chloride  3 mL Intravenous Q12H  . traMADol  50 mg Oral Q12H   Continuous Infusions:  PRN Meds:.chlorpheniramine-HYDROcodone, HYDROcodone-acetaminophen, levalbuterol, ondansetron **OR** ondansetron (ZOFRAN) IV  Assessment/Plan: Principal Problem:   HCAP (healthcare-associated pneumonia) Active Problems:   Atrial fibrillation   Diabetes mellitus   OSA on CPAP   Else [name] is a 74 y.o. female with PMH of atrial fibrillation not anticoagulated 2/2 multiple falls and subdural hematoma, DM2, OSA, GERD who presents with fever, cough, and malaise most consistent with HCAP given recent hospitalization.  #HCAP: Patient presented with productive cough and tachycardia/tachypnea. She was found to have RLL HCAP on x-ray. In the ED patient received in the ED where she received azithromycin and ceftriaxone IV as well as Duoneb treatments. She received Cefepime 1 g and Vancomycin 500 mg IV.On admission WBC was 8.3 and on 1/26 it had trended down to 4.4.  -Start Levaquin 750 mg by mouth every other day for (Day 3/10 with end date 11/18/14) -Continue levalbuterol neb 0.63 mg three times daily -Tussionex 5 mL po q12hprn -montelukast 10 mg daily -BCx x 2: NGTD -Urine cx: NGTD -sputum culture and gram stain: need to recollect -strep pneumo urine antigen: Negative -legionella pneumo urine antigen -respiratory virus panel pending -influenza panel: Negative -procalcitonin: <0.10 -HIV: negative -BMP: K 3.4. Patient has received 40 mEq of Klor-con since then -CBC: WBC 7.9, Hgb 11.5 -lactic acid 1.9 -on tele  #Pulmonary edema: Ms. [name] received 2 L NS in the ED. She subsequently developed respiratory distress. On exam she was diaphoretic with labored breathing, had coarse crackles bilaterally and oxygen saturation down to 85%. She had a repeat  CXR which demonstrated, compared to original CXR on presentation, central vascular congestion and central and medial lung base peribronchial thickening. She was then given lasix 40 mg IV once by the ED provider. She then reported improvement in her symptoms. At the time of our assessment, she still had mild bibasilar crackles but was breathing comfortably at 98% on 2 L [place]. -cont to monitor -continuous pulse ox -cardiac  monitoring  #Atrial fibrillation in RVR: She is in atrial fibrillation on EKG. Her HR has been consistently in the 100s-150s since presentation. Her rapid ventricular rate may be multifactorial. This may be due to her infection. She also has received continuous nebulizer and was in respiratory distress as noted above. She is not anticoagulated as she has a history of multiple falls including one in 04/2014 while on warfarin that resulted in a subdural hematoma. When her continuous neb was held, her HR began going down to the 120s during our assessment. At home she is rate controlled with atenolol 12.5 mg daily -continue home ASA 81 mg daily. -on tele -Per daughter Ms. Cu's blood pressure is at baseline with systolics at around 110 -Checking orthostatics before restarting atenolol  #DM2: Presenting blood sugar of 164. Last hemoglobin A1c in our record is 6.3 on 05/12/2014. At home she is on metformin 500 mg bid. On 1/24 Hgb A1C 6.3 -hold merformin -SSI-sensitive  #Hyperlipidemia: No lipid panel in EMR. She is on pravastatin 10 mg daily at home. -Pravastatin 10 mg  #Diet: Regular  #DVT PPx: heparin 5000 u [place] tid  #Code: full  Dispo: Anticipated discharge today  The patient does have a current PCP [name], MD) and does need an [facility] hospital follow-up appointment after discharge.  The patient does not know have transportation limitations that hinder transportation to clinic appointments.  Services Needed at time of discharge: Y = Yes, Blank = No PT:   OT:   RN:     Equipment:   Other:      LOS: 2 days   This is a Psychologist, occupational Note. The care of the patient was discussed with Dr. Yetta Barre and the assessment and plan was formulated with their assistance. Please see their note for official documentation of the patient encounter.  Signed: Chiquita Loth, Med Student Internal Medicine Teaching Service Team B2  [phone] 11/11/2014, 11:20 AM

## 2014-11-11 NOTE — Discharge Summary (Signed)
Name: [name] MRN: [id number] DOB: 04-22-1941 74 y.o. PCP: [name], MD  Date of Admission: 11/09/2014  3:31 PM Date of Discharge: 11/11/2014 Attending Physician: Earl Lagos, MD  Discharge Diagnosis: 1. HCAP 2. Atrial Fibrillation w/ RVR 3. Pulmonary Edema  Discharge Medications:   Medication List    STOP taking these medications        levofloxacin 500 MG tablet  Commonly known as:  LEVAQUIN     ondansetron 4 MG tablet  Commonly known as:  ZOFRAN      TAKE these medications        alendronate 70 MG tablet  Commonly known as:  FOSAMAX  Take 70 mg by mouth every Monday.     aspirin EC 81 MG tablet  Take 81 mg by mouth daily.     b complex vitamins tablet  Take 1 tablet by mouth daily.     bacitracin ointment  Apply 1 application topically 2 (two) times daily.     CALTRATE 600 PLUS-VIT D PO  Take 1 tablet by mouth 2 (two) times daily.     chlorpheniramine-HYDROcodone 10-8 MG/5ML Lqcr  Commonly known as:  TUSSIONEX PENNKINETIC ER  Take 5 mLs by mouth every 12 (twelve) hours as needed for cough.     DAILY MULTIVITAMIN PO  Take 1 tablet by mouth daily.     DSS 100 MG Caps  Take 100 mg by mouth 2 (two) times daily.     fluticasone 50 MCG/ACT nasal spray  Commonly known as:  FLONASE  Place 1 spray into both nostrils daily as needed for allergies.     HYDROcodone-acetaminophen 5-325 MG per tablet  Commonly known as:  NORCO/VICODIN  Take 1 tablet by mouth every 6 (six) hours as needed for severe pain.     Krill Oil 1000 MG Caps  Take 1,000 mg by mouth 2 (two) times daily.     metFORMIN 500 MG 24 hr tablet  Commonly known as:  GLUCOPHAGE-XR  Take 500 mg by mouth 2 (two) times daily.     montelukast 10 MG tablet  Commonly known as:  SINGULAIR  Take 10 mg by mouth at bedtime.     pravastatin 10 MG tablet  Commonly known as:  PRAVACHOL  Take 10 mg by mouth daily.     traMADol 50 MG tablet  Commonly known as:  ULTRAM  Take 1 tablet (50  mg total) by mouth 2 (two) times daily.      ASK your doctor about these medications        atenolol 25 MG tablet  Commonly known as:  TENORMIN  Take 12.5 mg by mouth daily.        Disposition and follow-up:   Ms.[name] was discharged from [facility] in Good condition.  At the hospital follow up visit please address:  1.  HCAP; Has patient's cough resolved? Completed course of Levaquin? SOB? Repeat CXR in 4 weeks.  Atrial Fibrillation; Is patient rate controlled? Did she restart Atenolol?  2.  Labs / imaging needed at time of follow-up: CBC, CXR (4 weeks)  3.  Pending labs/ test needing follow-up: None  Follow-up Appointments:     Follow-up Information    Follow up with [facility], [place].   Specialty:  Internal Medicine   Contact information:   [id number] [name] [address] [phone]        Consultations:  None  Procedures Performed:  Dg Chest 2 View  11/09/2014   CLINICAL DATA:  Chest pain  EXAM: CHEST  2 VIEW  COMPARISON:  08/25/2014  FINDINGS: Cardiac shadow is stable. The lungs are well aerated bilaterally with early infiltrate in the right lower lobe medially. No other focal infiltrate is seen. No bony abnormality is noted.  IMPRESSION: Right lower lobe infiltrate.   Electronically Signed   By: Alcide Clever M.D.   On: 11/09/2014 14:53   Dg Chest Portable 1 View  11/09/2014   CLINICAL DATA:  Labored breathing that started while in the ED.  EXAM: PORTABLE CHEST - 1 VIEW  COMPARISON:  11/09/2014  FINDINGS: Cardiac silhouette is mildly enlarged. Aorta is mildly uncoiled and tortuous. No convincing mediastinal or hilar mass.  There is central vascular congestion as well as central peribronchial interstitial thickening and medial lower lung patchy airspace opacity, greater on the right. This appears subtly increased from the prior exam. Findings may be infectious due to multifocal pneumonia. Interstitial asymmetric airspace edema  should be considered in the proper clinical setting.  No pleural effusion or pneumothorax.  Bony thorax is demineralized but grossly intact.  IMPRESSION: 1. Mild worsening lung aeration from the earlier study. In addition to the right lower lobe infiltrate described previously, there is central vascular congestion and central and medial lung base peribronchial interstitial thickening. Possible etiologies include multifocal pneumonia and asymmetric edema.   Electronically Signed   By: Amie Portland M.D.   On: 11/09/2014 18:24     Admission HPI: Ms [name] is a 74 year old woman with atrial fibrillation not anticoagulated 2/2 multiple falls and subdural hematoma, DM2, OSA, GERD who presents with fever, cough, and malaise. History was obtained primarily with assistance from daughter who is a critical care nurse and lives with the patient. Ms [name] was in her usual state of health until 1/21 when she developed malaise and had a home temperature of 102.4 as well as feeling like her heart is racing. On 1/22 she developed a dry cough in the evening with continued subjective fever and malaise. Yesterday this cough became productive of yellow sputum with associated chest heaviness and SOB and continued fevers. She said this morning she had a 8/10 right sided headache that improved with tramadol. Of note, she was hospitalized from 11/9-11/10/15 for multiple complaints including a UTI and acute bronchitis treated with levaquin.  In the ED, she received azithromycin 500 mg iv once, ceftriaxone 1 g iv once, ipratropium 3 mL neb once, albuterol neb continuous, tussionex 5 mL po once, and 2 L NS. She then developed acute respiratory distress. She was noted to be diaphoretic with labored breathing, had coarse crackles bilaterally and oxygen saturation down to 85%. She had a repeat CXR which demonstrated, compared to original CXR on presentation, central vascular congestion and central and medial lung base peribronchial  thickening. She was given lasix 40 mg iv once with improvement in symptoms.   Hospital Course by problem list:   1. HCAP- Admitted w/ complaints of productive cough, fever, chills. CXR showed RLL infiltrate. Patient was given 2L NS in ED and seemed to have resulting volume overload, likely in the setting of flash pulmonary edema 2/2 rapid atrial fibrillation. Given lasix 40 mg IV w/ improvement in symptoms. Patient started on Rocephin + Azithromycin for presumed CAP, however, patient was admitted by hospitalist team in 08/2014. Changed antibiotic coverage to Vancomycin + Cefepime for HCAP coverage. Blood cultures x2, sputum culture, and gram stain negative. Strep pneumo urinary antigen negative, flu negative. Respiratory  virus panel negative. On 11/11/14, transitioned to Levaquin (end date 11/18/14).   2. Atrial Fibrillation w/ RVR- Tachycardic on admission, likely contributing to pulmonary edema described below. Rate became better controlled, decreasing into the 90-100 range. Restarted Atenolol on discharge. Patient only on ASA given her risk of falls on anticoagulation.   3. Pulmonary Edema- Given 2L IVF in ED d/t sepsis picture in the setting of HCAP. Patient also quite tachycardic, likely causing flash pulmonary edema. Given Lasix 40 mg IV in ED w/ improvement of breathing.    Discharge Vitals:   BP 107/77 mmHg  Pulse 86  Temp(Src) 97.7 F (36.5 C) (Oral)  Resp 17  Ht 4\' 11"  (1.499 m)  Wt 120 lb 6.4 oz (54.613 kg)  BMI 24.30 kg/m2  SpO2 98%  Discharge Labs:  Results for orders placed or performed during the hospital encounter of 11/09/14 (from the past 24 hour(s))  Glucose, capillary     Status: Abnormal   Collection Time: 11/10/14 12:30 PM  Result Value Ref Range   Glucose-Capillary 110 (H) 70 - 99 mg/dL  Glucose, capillary     Status: Abnormal   Collection Time: 11/10/14  4:10 PM  Result Value Ref Range   Glucose-Capillary 110 (H) 70 - 99 mg/dL  Glucose, capillary     Status:  Abnormal   Collection Time: 11/10/14  9:45 PM  Result Value Ref Range   Glucose-Capillary 120 (H) 70 - 99 mg/dL   Comment 1 Notify RN   Basic metabolic panel     Status: Abnormal   Collection Time: 11/11/14  7:25 AM  Result Value Ref Range   Sodium 141 135 - 145 mmol/L   Potassium 4.1 3.5 - 5.1 mmol/L   Chloride 108 96 - 112 mmol/L   CO2 22 19 - 32 mmol/L   Glucose, Bld 102 (H) 70 - 99 mg/dL   BUN 8 6 - 23 mg/dL   Creatinine, Ser 1.610.55 0.50 - 1.10 mg/dL   Calcium 9.0 8.4 - 09.610.5 mg/dL   GFR calc non Af Amer >90 >90 mL/min   GFR calc Af Amer >90 >90 mL/min   Anion gap 11 5 - 15  CBC with Differential/Platelet     Status: Abnormal   Collection Time: 11/11/14  7:25 AM  Result Value Ref Range   WBC 4.4 4.0 - 10.5 K/uL   RBC 4.12 3.87 - 5.11 MIL/uL   Hemoglobin 12.6 12.0 - 15.0 g/dL   HCT 04.537.7 40.936.0 - 81.146.0 %   MCV 91.5 78.0 - 100.0 fL   MCH 30.6 26.0 - 34.0 pg   MCHC 33.4 30.0 - 36.0 g/dL   RDW 91.412.1 78.211.5 - 95.615.5 %   Platelets 344 150 - 400 K/uL   Neutrophils Relative % 48 43 - 77 %   Neutro Abs 2.1 1.7 - 7.7 K/uL   Lymphocytes Relative 25 12 - 46 %   Lymphs Abs 1.1 0.7 - 4.0 K/uL   Monocytes Relative 19 (H) 3 - 12 %   Monocytes Absolute 0.8 0.1 - 1.0 K/uL   Eosinophils Relative 7 (H) 0 - 5 %   Eosinophils Absolute 0.3 0.0 - 0.7 K/uL   Basophils Relative 1 0 - 1 %   Basophils Absolute 0.0 0.0 - 0.1 K/uL  Magnesium     Status: None   Collection Time: 11/11/14  7:25 AM  Result Value Ref Range   Magnesium 2.3 1.5 - 2.5 mg/dL  Glucose, capillary     Status: Abnormal  Collection Time: 11/11/14  8:31 AM  Result Value Ref Range   Glucose-Capillary 109 (H) 70 - 99 mg/dL    Signed: Courtney Paris, MD 11/11/2014, 11:38 AM    Services Ordered on Discharge: none Equipment Ordered on Discharge: none

## 2014-11-11 NOTE — Discharge Instructions (Signed)
1. Please schedule a follow up appointment as below:  Gwenyth BouillonMillman, Franklyn  Schedule within 1 week for hospital follow up.  [address] [place] [id number] [phone]  2. Please take all medications as prescribed.   Take Levaquin 750 every other day until 11/18/14.   Continue to take Tussionex every 12 hours as needed for cough.   3. If you have worsening of your symptoms or new symptoms arise, please call the clinic [phone]), or go to the ER immediately if symptoms are severe.  Vim ph?i (Pneumonia) Vim ph?i l b?nh nhi?m trng ph?i.  NGUYN NHN Vim ph?i c th? do vi khu?n ho?c vi rt gy ra. Thng th??ng, cc nhi?m trng ny x?y ra do ht cc h?t mang m?m b?nh vo ph?i (???ng h h?p). D?U HI?U V TRI?U CH?NG   Ho.  S?t.  ?au ng?c.  Nh?p th? t?ng ln.  Th? kh kh.  S?n sinh d?ch nh?y. CH?N ?ON  N?u qu v? c cc tri?u ch?ng ph? bi?n c?a b?nh vim ph?i, chuyn gia ch?m [place] s?c kh?e c?a qu v? th??ng s? xc nh?n ch?n ?on b?ng X-quang ph?i. X-quang s? hi?n th? ph?n b?t th??ng trong ph?i (thm nhi?m ph?i) n?u qu v? b? vim ph?i. Cc xt nghi?m mu, n??c ti?u ho?c ??m khc c th? ???c th?c hi?n ?? tm ra nguyn nhn c? th? gy b?nh vim ph?i c?a qu v?. Chuyn gia ch?m [place] s?c kh?e c?a qu v? c th? c?ng lm cc xt nghi?m (kh mu ho?c ?o ?? bo ha oxy trong mu) ?? xem ph?i c?a qu v? ?ang ho?t ??ng nh? th? no. ?I?U TR?  M?t s? d?ng vim ph?i c th? ly lan sang ng??i khc khi qu v? ho ho?c h?t h?i. Qu v? c th? ???c yu c?u ?eo kh?u trang tr??c v trong qu trnh khm. Vim ph?i do vi khu?n gy ra ???c ?i?u tr? b?ng thu?c khng sinh. Vim ph?i do vi rt cm gy ra c th? ???c ?i?u tr? b?ng thu?c khng vi rt. H?u h?t cc b?nh nhi?m trng do vi rt khc ph?i ?i h?t ti?n trnh c?a b?nh. Cc b?nh nhi?m trng ny s? khng ?p ?ng v?i thu?c khng sinh.  H??NG D?N CH?M [place] T?I NH   Thu?c ho c th? ???c s? d?ng n?u qu v? khng ???c ngh? ng?i nhi?u. Tuy nhin,  ho b?o v? qu v? b?ng cch lm s?ch ph?i. Qu v? nn trnh s? d?ng thu?c ho n?u c th?Shaune Pascal.  Chuyn gia ch?m [place] s?c kh?e c?a qu v? c th? ? k ??n thu?c n?u ngh? r?ng qu v? b? vim ph?i do vi khu?n ho?c do cm. Hy dng h?t thu?c ngay c? khi qu v? b?t ??u c?m th?y kh h?n.  Chuyn gia ch?m [place] s?c kh?e c?ng c th? k ??n thu?c long ??m. Thu?c ny lm l?ng d?ch nh?y ?? ho ra ngoi.  Ch? s? d?ng thu?c theo ch? d?n c?a chuyn gia ch?m [place] s?c kh?e.  Khng ht thu?c. Ht thu?c l l nguyn nhn ph? bi?n gy vim ph? qu?n v c th? gp ph?n gy vim ph?i. N?u qu v? ht thu?c v ti?p t?c ht thu?c, ch?ng ho c?a qu v? c th? ko di vi tu?n sau khi h?t vim ph?i.  My phun h?i n??c mt ho?c my t?o h?i ?m trong phng ho?c trong nh qu v? c th? gip lm l?ng d?ch nh?y.  Ho th??ng n?ng h?n vo ban ?m. Ng? ?  t? th? n?a n?m n?a ng?i trong m?t chi?c gh? t?a ho?c s? d?ng m?t ?i g?i d??i ??u s? gip lm d?u b?t v?n ?? ny.  Ngh? ng?i khi qu v? c?m th?y c?n. C? th? c?a qu v? th??ng s? cho qu v? bi?t khi no c?n ngh? ng?i. PHNG NG?A C th? tim (v?c xin) ph? c?u khu?n ?? trnh vi khu?n thng th??ng gy vim ph?i. Tim v?c xin th??ng ???c ?? ngh? cho:  Nh?ng ng??i trn 65 tu?i.  B?nh nhn ?ang dng ha tr? li?u.  Nh?ng ng??i c v?n ?? v? ph?i m?n tnh, ch?ng h?n nh? vim ti?u ph? qu?n ho?c kh ph? th?ng.  Nh?ng ng??i c v?n ?? v? h? th?ng mi?n d?ch. N?u qu v? trn 65 tu?i ho?c c tnh tr?ng nguy c? cao, qu v? c th? ???c tim v?cxin ph? c?u khu?n n?u qu v? ch?a ???c tim tr??c ?. ? m?t s? n??c, v?cxin cm thng th??ng c?ng ???c ?? ngh?[name] V?cxin ny c th? gip phng ng?a m?t s? tr??ng h?p vim ph?i. Qu v? c th? s? ???c cung c?p v?cxin cm nh? l m?t ph?n c?a d?ch v? ch?m [place]. N?u qu v? ht thu?c, ? t?i lc b? thu?c. Qu v? c th? ???c h??ng d?n v? cch d?ng ht thu?c. Chuyn gia ch?m [place] s?c kh?e c?a qu v? c th? cung c?p thu?c v t? v?n ?? gip qu v? b? thu?c l. ?I KHM N?U: Qu v?  b? s?t. NGAY L?P T?C ?I KHM N?U:   B?nh c?a qu v? tr? nn t? h?n. ?i?u ny ??c bi?t ?ng n?u qu v? cao tu?i ho?c ? b? y?u do b?t k? b?nh no khc.  Qu v? khng th? ki?m sot c?n ho c?a mnh b?ng thu?c ho v ?ang b? m?t ng?.  Qu v? b?t ??u ho ra mu.  Qu v? b? ?au v tr? nn n?ng h?n ho?c khng ki?m sot ???c b?ng thu?c.  B?t k? tri?u ch?ng no ban ??u khi?n qu v? ph?i ??n ?i?u tr? tr? nn nghim tr?ng h?n thay v t?t h?n.  Qu v? b? kh th? ho?c ?au ng?c. ??M B?O QU V?:   Hi?u r cc h??ng d?n ny.  S? theo di tnh tr?ng c?a mnh.  S? yu c?u tr? gip ngay l?p t?c n?u qu v? c?m th?y khng kh?e ho?c th?y tr?m tr?ng h?n. Document Released: 10/03/2005 Document Revised: 02/17/2014 [facility] Patient Information 2015 [place], [place]. This information is not intended to replace advice given to you by your health care provider. Make sure you discuss any questions you have with your health care provider.

## 2014-11-11 NOTE — Progress Notes (Signed)
NURSING PROGRESS NOTE  [name] [id number] Discharge Data: 11/11/2014 2:04 PM Attending Provider: Earl LagosNischal Narendra, MD MVH:[id number]:MILLMAN,FRANKLYN, MD     [facility] MD order.  Discussed with the patient the After Visit Summary and all questions fully answered. All IV's discontinued with no bleeding noted. All belongings returned to patient for patient to take home.   Last Vital Signs:  Blood pressure 105/72, pulse 98, temperature 97.7 F (36.5 C), temperature source Oral, resp. rate 17, height 4\' 11"  (1.499 m), weight 54.613 kg (120 lb 6.4 oz), SpO2 98 %.  Discharge Medication List   Medication List    STOP taking these medications        ondansetron 4 MG tablet  Commonly known as:  ZOFRAN      TAKE these medications        albuterol 108 (90 BASE) MCG/ACT inhaler  Commonly known as:  PROVENTIL HFA;VENTOLIN HFA  Inhale 2 puffs into the lungs every 6 (six) hours as needed for wheezing or shortness of breath.     alendronate 70 MG tablet  Commonly known as:  FOSAMAX  Take 70 mg by mouth every Monday.     aspirin EC 81 MG tablet  Take 81 mg by mouth daily.     atenolol 25 MG tablet  Commonly known as:  TENORMIN  Take 12.5 mg by mouth daily.     b complex vitamins tablet  Take 1 tablet by mouth daily.     bacitracin ointment  Apply 1 application topically 2 (two) times daily.     CALTRATE 600 PLUS-VIT D PO  Take 1 tablet by mouth 2 (two) times daily.     chlorpheniramine-HYDROcodone 10-8 MG/5ML Lqcr  Commonly known as:  TUSSIONEX PENNKINETIC ER  Take 5 mLs by mouth every 12 (twelve) hours as needed for cough.     DAILY MULTIVITAMIN PO  Take 1 tablet by mouth daily.     DSS 100 MG Caps  Take 100 mg by mouth 2 (two) times daily.     fluticasone 50 MCG/ACT nasal spray  Commonly known as:  FLONASE  Place 1 spray into both nostrils daily as needed for allergies.     HYDROcodone-acetaminophen 5-325 MG per tablet  Commonly known as:  NORCO/VICODIN   Take 1 tablet by mouth every 6 (six) hours as needed for severe pain.     HYDROcodone-acetaminophen 5-325 MG per tablet  Commonly known as:  NORCO/VICODIN  Take 1 tablet by mouth every 6 (six) hours as needed for severe pain.     Krill Oil 1000 MG Caps  Take 1,000 mg by mouth 2 (two) times daily.     levofloxacin 750 MG tablet  Commonly known as:  LEVAQUIN  Take 1 tablet (750 mg total) by mouth daily.     metFORMIN 500 MG 24 hr tablet  Commonly known as:  GLUCOPHAGE-XR  Take 500 mg by mouth 2 (two) times daily.     montelukast 10 MG tablet  Commonly known as:  SINGULAIR  Take 10 mg by mouth at bedtime.     pravastatin 10 MG tablet  Commonly known as:  PRAVACHOL  Take 10 mg by mouth daily.     traMADol 50 MG tablet  Commonly known as:  ULTRAM  Take 1 tablet (50 mg total) by mouth 2 (two) times daily.         Cathlyn Parsonsattha Yana Schorr, RN

## 2014-11-12 LAB — RESPIRATORY VIRUS PANEL
Adenovirus: NEGATIVE
Influenza A: NEGATIVE
Influenza B: NEGATIVE
Metapneumovirus: NEGATIVE
Parainfluenza 1: NEGATIVE
Parainfluenza 2: NEGATIVE
Parainfluenza 3: NEGATIVE
Respiratory Syncytial Virus A: NEGATIVE
Respiratory Syncytial Virus B: NEGATIVE
Rhinovirus: NEGATIVE

## 2014-11-15 LAB — CULTURE, BLOOD (ROUTINE X 2)
CULTURE: NO GROWTH
Culture: NO GROWTH

## 2016-10-15 ENCOUNTER — Emergency Department (HOSPITAL_COMMUNITY): Payer: Medicare Other | Admitting: Certified Registered"

## 2016-10-15 ENCOUNTER — Inpatient Hospital Stay (HOSPITAL_COMMUNITY): Payer: Medicare Other

## 2016-10-15 ENCOUNTER — Encounter (HOSPITAL_COMMUNITY): Payer: Self-pay | Admitting: Radiology

## 2016-10-15 ENCOUNTER — Emergency Department (HOSPITAL_COMMUNITY): Payer: Medicare Other

## 2016-10-15 ENCOUNTER — Encounter (HOSPITAL_COMMUNITY)
Admission: EM | Disposition: A | Discharge status: Rehabilitation Facility | Payer: Self-pay | Source: Home / Self Care | Attending: Neurology

## 2016-10-15 ENCOUNTER — Inpatient Hospital Stay (HOSPITAL_COMMUNITY)
Admission: EM | Admit: 2016-10-15 | Discharge: 2016-10-19 | DRG: 024 | Disposition: A | Discharge status: Rehabilitation Facility | Payer: Medicare Other | Attending: Neurology | Admitting: Neurology

## 2016-10-15 DIAGNOSIS — Z6828 Body mass index (BMI) 28.0-28.9, adult: Secondary | ICD-10-CM

## 2016-10-15 DIAGNOSIS — E876 Hypokalemia: Secondary | ICD-10-CM

## 2016-10-15 DIAGNOSIS — Z8679 Personal history of other diseases of the circulatory system: Secondary | ICD-10-CM | POA: Diagnosis not present

## 2016-10-15 DIAGNOSIS — J9600 Acute respiratory failure, unspecified whether with hypoxia or hypercapnia: Secondary | ICD-10-CM | POA: Diagnosis not present

## 2016-10-15 DIAGNOSIS — R471 Dysarthria and anarthria: Secondary | ICD-10-CM | POA: Diagnosis present

## 2016-10-15 DIAGNOSIS — I69398 Other sequelae of cerebral infarction: Secondary | ICD-10-CM | POA: Diagnosis not present

## 2016-10-15 DIAGNOSIS — I69391 Dysphagia following cerebral infarction: Secondary | ICD-10-CM | POA: Diagnosis not present

## 2016-10-15 DIAGNOSIS — I69354 Hemiplegia and hemiparesis following cerebral infarction affecting left non-dominant side: Secondary | ICD-10-CM | POA: Diagnosis not present

## 2016-10-15 DIAGNOSIS — R1310 Dysphagia, unspecified: Secondary | ICD-10-CM | POA: Diagnosis present

## 2016-10-15 DIAGNOSIS — Z9841 Cataract extraction status, right eye: Secondary | ICD-10-CM

## 2016-10-15 DIAGNOSIS — E781 Pure hyperglyceridemia: Secondary | ICD-10-CM | POA: Diagnosis present

## 2016-10-15 DIAGNOSIS — Z961 Presence of intraocular lens: Secondary | ICD-10-CM | POA: Diagnosis present

## 2016-10-15 DIAGNOSIS — R51 Headache: Secondary | ICD-10-CM | POA: Diagnosis not present

## 2016-10-15 DIAGNOSIS — D72829 Elevated white blood cell count, unspecified: Secondary | ICD-10-CM | POA: Diagnosis present

## 2016-10-15 DIAGNOSIS — R7989 Other specified abnormal findings of blood chemistry: Secondary | ICD-10-CM | POA: Diagnosis not present

## 2016-10-15 DIAGNOSIS — I63411 Cerebral infarction due to embolism of right middle cerebral artery: Principal | ICD-10-CM | POA: Diagnosis present

## 2016-10-15 DIAGNOSIS — J9601 Acute respiratory failure with hypoxia: Secondary | ICD-10-CM | POA: Diagnosis not present

## 2016-10-15 DIAGNOSIS — E8809 Other disorders of plasma-protein metabolism, not elsewhere classified: Secondary | ICD-10-CM

## 2016-10-15 DIAGNOSIS — D62 Acute posthemorrhagic anemia: Secondary | ICD-10-CM | POA: Diagnosis not present

## 2016-10-15 DIAGNOSIS — R05 Cough: Secondary | ICD-10-CM

## 2016-10-15 DIAGNOSIS — L8990 Pressure ulcer of unspecified site, unspecified stage: Secondary | ICD-10-CM | POA: Insufficient documentation

## 2016-10-15 DIAGNOSIS — R34 Anuria and oliguria: Secondary | ICD-10-CM | POA: Diagnosis present

## 2016-10-15 DIAGNOSIS — I482 Chronic atrial fibrillation: Secondary | ICD-10-CM | POA: Diagnosis not present

## 2016-10-15 DIAGNOSIS — G4733 Obstructive sleep apnea (adult) (pediatric): Secondary | ICD-10-CM

## 2016-10-15 DIAGNOSIS — R059 Cough, unspecified: Secondary | ICD-10-CM

## 2016-10-15 DIAGNOSIS — K219 Gastro-esophageal reflux disease without esophagitis: Secondary | ICD-10-CM | POA: Diagnosis present

## 2016-10-15 DIAGNOSIS — I10 Essential (primary) hypertension: Secondary | ICD-10-CM | POA: Diagnosis present

## 2016-10-15 DIAGNOSIS — E785 Hyperlipidemia, unspecified: Secondary | ICD-10-CM | POA: Diagnosis present

## 2016-10-15 DIAGNOSIS — Q251 Coarctation of aorta: Secondary | ICD-10-CM

## 2016-10-15 DIAGNOSIS — I4891 Unspecified atrial fibrillation: Secondary | ICD-10-CM | POA: Diagnosis present

## 2016-10-15 DIAGNOSIS — I6200 Nontraumatic subdural hemorrhage, unspecified: Secondary | ICD-10-CM | POA: Diagnosis not present

## 2016-10-15 DIAGNOSIS — G8929 Other chronic pain: Secondary | ICD-10-CM | POA: Diagnosis not present

## 2016-10-15 DIAGNOSIS — E1165 Type 2 diabetes mellitus with hyperglycemia: Secondary | ICD-10-CM | POA: Diagnosis present

## 2016-10-15 DIAGNOSIS — R350 Frequency of micturition: Secondary | ICD-10-CM | POA: Diagnosis not present

## 2016-10-15 DIAGNOSIS — G441 Vascular headache, not elsewhere classified: Secondary | ICD-10-CM

## 2016-10-15 DIAGNOSIS — G8194 Hemiplegia, unspecified affecting left nondominant side: Secondary | ICD-10-CM | POA: Diagnosis present

## 2016-10-15 DIAGNOSIS — E46 Unspecified protein-calorie malnutrition: Secondary | ICD-10-CM | POA: Diagnosis present

## 2016-10-15 DIAGNOSIS — Z7982 Long term (current) use of aspirin: Secondary | ICD-10-CM

## 2016-10-15 DIAGNOSIS — E1142 Type 2 diabetes mellitus with diabetic polyneuropathy: Secondary | ICD-10-CM | POA: Diagnosis not present

## 2016-10-15 DIAGNOSIS — Z9289 Personal history of other medical treatment: Secondary | ICD-10-CM

## 2016-10-15 DIAGNOSIS — Z9989 Dependence on other enabling machines and devices: Secondary | ICD-10-CM

## 2016-10-15 DIAGNOSIS — R269 Unspecified abnormalities of gait and mobility: Secondary | ICD-10-CM | POA: Diagnosis not present

## 2016-10-15 DIAGNOSIS — R2689 Other abnormalities of gait and mobility: Secondary | ICD-10-CM | POA: Diagnosis not present

## 2016-10-15 DIAGNOSIS — R29810 Facial weakness: Secondary | ICD-10-CM | POA: Diagnosis present

## 2016-10-15 DIAGNOSIS — Z9842 Cataract extraction status, left eye: Secondary | ICD-10-CM

## 2016-10-15 DIAGNOSIS — E119 Type 2 diabetes mellitus without complications: Secondary | ICD-10-CM

## 2016-10-15 DIAGNOSIS — I639 Cerebral infarction, unspecified: Secondary | ICD-10-CM | POA: Diagnosis not present

## 2016-10-15 DIAGNOSIS — R29718 NIHSS score 18: Secondary | ICD-10-CM | POA: Diagnosis present

## 2016-10-15 DIAGNOSIS — M549 Dorsalgia, unspecified: Secondary | ICD-10-CM | POA: Diagnosis not present

## 2016-10-15 DIAGNOSIS — R4182 Altered mental status, unspecified: Secondary | ICD-10-CM | POA: Diagnosis present

## 2016-10-15 DIAGNOSIS — E1159 Type 2 diabetes mellitus with other circulatory complications: Secondary | ICD-10-CM | POA: Diagnosis not present

## 2016-10-15 DIAGNOSIS — Z9181 History of falling: Secondary | ICD-10-CM

## 2016-10-15 DIAGNOSIS — Z79899 Other long term (current) drug therapy: Secondary | ICD-10-CM | POA: Diagnosis not present

## 2016-10-15 DIAGNOSIS — Z7984 Long term (current) use of oral hypoglycemic drugs: Secondary | ICD-10-CM | POA: Diagnosis not present

## 2016-10-15 HISTORY — PX: RADIOLOGY WITH ANESTHESIA: SHX6223

## 2016-10-15 HISTORY — PX: IR GENERIC HISTORICAL: IMG1180011

## 2016-10-15 LAB — I-STAT CHEM 8, ED
BUN: 16 mg/dL (ref 6–20)
Calcium, Ion: 1.21 mmol/L (ref 1.15–1.40)
Chloride: 103 mmol/L (ref 101–111)
Creatinine, Ser: 0.7 mg/dL (ref 0.44–1.00)
GLUCOSE: 143 mg/dL — AB (ref 65–99)
HCT: 44 % (ref 36.0–46.0)
HEMOGLOBIN: 15 g/dL (ref 12.0–15.0)
Potassium: 3.8 mmol/L (ref 3.5–5.1)
Sodium: 142 mmol/L (ref 135–145)
TCO2: 28 mmol/L (ref 0–100)

## 2016-10-15 LAB — POCT I-STAT 3, ART BLOOD GAS (G3+)
BICARBONATE: 23.6 mmol/L (ref 20.0–28.0)
O2 Saturation: 100 %
PCO2 ART: 35.1 mmHg (ref 32.0–48.0)
PH ART: 7.434 (ref 7.350–7.450)
PO2 ART: 339 mmHg — AB (ref 83.0–108.0)
Patient temperature: 97.9
TCO2: 25 mmol/L (ref 0–100)

## 2016-10-15 LAB — COMPREHENSIVE METABOLIC PANEL
ALT: 16 U/L (ref 14–54)
ANION GAP: 12 (ref 5–15)
AST: 22 U/L (ref 15–41)
Albumin: 4 g/dL (ref 3.5–5.0)
Alkaline Phosphatase: 42 U/L (ref 38–126)
BUN: 14 mg/dL (ref 6–20)
CO2: 27 mmol/L (ref 22–32)
Calcium: 10.4 mg/dL — ABNORMAL HIGH (ref 8.9–10.3)
Chloride: 104 mmol/L (ref 101–111)
Creatinine, Ser: 0.68 mg/dL (ref 0.44–1.00)
GFR calc non Af Amer: >60 mL/min (ref >60)
Glucose, Bld: 142 mg/dL — ABNORMAL HIGH (ref 65–99)
Potassium: 3.9 mmol/L (ref 3.5–5.1)
SODIUM: 143 mmol/L (ref 135–145)
TOTAL PROTEIN: 7.4 g/dL (ref 6.5–8.1)
Total Bilirubin: 0.6 mg/dL (ref 0.3–1.2)

## 2016-10-15 LAB — CBC
HCT: 42.9 % (ref 36.0–46.0)
Hemoglobin: 14.7 g/dL (ref 12.0–15.0)
MCH: 31.8 pg (ref 26.0–34.0)
MCHC: 34.3 g/dL (ref 30.0–36.0)
MCV: 92.9 fL (ref 78.0–100.0)
PLATELETS: 321 10*3/uL (ref 150–400)
RBC: 4.62 MIL/uL (ref 3.87–5.11)
RDW: 12 % (ref 11.5–15.5)
WBC: 6.5 10*3/uL (ref 4.0–10.5)

## 2016-10-15 LAB — PROTIME-INR
INR: 0.95
Prothrombin Time: 12.7 seconds (ref 11.4–15.2)

## 2016-10-15 LAB — DIFFERENTIAL
Basophils Absolute: 0 10*3/uL (ref 0.0–0.1)
Basophils Relative: 1 %
EOS ABS: 0.2 10*3/uL (ref 0.0–0.7)
EOS PCT: 3 %
LYMPHS PCT: 27 %
Lymphs Abs: 1.7 10*3/uL (ref 0.7–4.0)
Monocytes Absolute: 0.4 10*3/uL (ref 0.1–1.0)
Monocytes Relative: 7 %
Neutro Abs: 4.1 10*3/uL (ref 1.7–7.7)
Neutrophils Relative %: 64 %

## 2016-10-15 LAB — TRIGLYCERIDES: Triglycerides: 196 mg/dL — ABNORMAL HIGH (ref <150)

## 2016-10-15 LAB — I-STAT TROPONIN, ED: Troponin i, poc: 0 ng/mL (ref 0.00–0.08)

## 2016-10-15 LAB — MRSA PCR SCREENING: MRSA by PCR: NEGATIVE

## 2016-10-15 LAB — APTT: aPTT: 43 seconds — ABNORMAL HIGH (ref 24–36)

## 2016-10-15 LAB — CBG MONITORING, ED: GLUCOSE-CAPILLARY: 134 mg/dL — AB (ref 65–99)

## 2016-10-15 SURGERY — RADIOLOGY WITH ANESTHESIA
Anesthesia: General

## 2016-10-15 MED ORDER — CHLORHEXIDINE GLUCONATE 0.12% ORAL RINSE (MEDLINE KIT)
15.0000 mL | Freq: Two times a day (BID) | OROMUCOSAL | Status: DC
  Administered 2016-10-15 – 2016-10-17 (×4): 15 mL via OROMUCOSAL

## 2016-10-15 MED ORDER — FENTANYL CITRATE (PF) 100 MCG/2ML IJ SOLN
50.0000 ug | INTRAMUSCULAR | Status: DC | PRN
  Administered 2016-10-15 – 2016-10-17 (×3): 50 ug via INTRAVENOUS
  Filled 2016-10-15 (×3): qty 2

## 2016-10-15 MED ORDER — ACETAMINOPHEN 160 MG/5ML PO SOLN
650.0000 mg | ORAL | Status: DC | PRN
  Administered 2016-10-15 – 2016-10-17 (×2): 650 mg
  Filled 2016-10-15 (×2): qty 20.3

## 2016-10-15 MED ORDER — DILTIAZEM HCL 100 MG IV SOLR: 5.0000 mg/h | INTRAVENOUS | Status: DC

## 2016-10-15 MED ORDER — PROPOFOL 10 MG/ML IV BOLUS
INTRAVENOUS | Status: DC | PRN
  Administered 2016-10-15: 100 mg via INTRAVENOUS

## 2016-10-15 MED ORDER — SODIUM CHLORIDE 0.9 % IV SOLN
INTRAVENOUS | Status: DC | PRN
  Administered 2016-10-15: 11:00:00 via INTRAVENOUS

## 2016-10-15 MED ORDER — ACETAMINOPHEN 325 MG PO TABS
650.0000 mg | ORAL_TABLET | ORAL | Status: DC | PRN
Start: 2016-10-15 — End: 2016-10-15

## 2016-10-15 MED ORDER — STROKE: EARLY STAGES OF RECOVERY BOOK
Freq: Once | Status: DC
  Filled 2016-10-15: qty 1

## 2016-10-15 MED ORDER — LABETALOL HCL 5 MG/ML IV SOLN
10.0000 mg | INTRAVENOUS | Status: DC | PRN
  Administered 2016-10-16: 20 mg via INTRAVENOUS
  Filled 2016-10-15: qty 4

## 2016-10-15 MED ORDER — ONDANSETRON HCL 4 MG/2ML IJ SOLN: 4.0000 mg | Freq: Four times a day (QID) | INTRAMUSCULAR | Status: DC | PRN

## 2016-10-15 MED ORDER — ORAL CARE MOUTH RINSE
15.0000 mL | OROMUCOSAL | Status: DC
  Administered 2016-10-15 – 2016-10-17 (×17): 15 mL via OROMUCOSAL

## 2016-10-15 MED ORDER — CLEVIDIPINE BUTYRATE 0.5 MG/ML IV EMUL
0.0000 mg/h | INTRAVENOUS | Status: DC
Start: 2016-10-15 — End: 2016-10-15

## 2016-10-15 MED ORDER — PANTOPRAZOLE SODIUM 40 MG IV SOLR
40.0000 mg | Freq: Every day | INTRAVENOUS | Status: DC
  Administered 2016-10-15 – 2016-10-16 (×2): 40 mg via INTRAVENOUS
  Filled 2016-10-15 (×2): qty 40

## 2016-10-15 MED ORDER — PROPOFOL 500 MG/50ML IV EMUL
INTRAVENOUS | Status: DC | PRN
  Administered 2016-10-15: 40 ug/kg/min via INTRAVENOUS

## 2016-10-15 MED ORDER — ACETAMINOPHEN 160 MG/5ML PO SOLN
650.0000 mg | ORAL | Status: DC | PRN
Start: 2016-10-15 — End: 2016-10-15

## 2016-10-15 MED ORDER — PROPOFOL 1000 MG/100ML IV EMUL
0.0000 ug/kg/min | INTRAVENOUS | Status: DC
  Administered 2016-10-15 (×2): 20 ug/kg/min via INTRAVENOUS
  Administered 2016-10-16: 10 ug/kg/min via INTRAVENOUS
  Administered 2016-10-16 – 2016-10-17 (×2): 5 ug/kg/min via INTRAVENOUS
  Filled 2016-10-15 (×4): qty 100

## 2016-10-15 MED ORDER — SUCCINYLCHOLINE CHLORIDE 20 MG/ML IJ SOLN
INTRAMUSCULAR | Status: DC | PRN
  Administered 2016-10-15: 60 mg via INTRAVENOUS

## 2016-10-15 MED ORDER — ACETAMINOPHEN 650 MG RE SUPP: 650.0000 mg | RECTAL | Status: DC | PRN

## 2016-10-15 MED ORDER — IOPAMIDOL (ISOVUE-300) INJECTION 61%
INTRAVENOUS | Status: AC
  Administered 2016-10-15: 50 mL
  Filled 2016-10-15: qty 300

## 2016-10-15 MED ORDER — FENTANYL CITRATE (PF) 100 MCG/2ML IJ SOLN
INTRAMUSCULAR | Status: DC | PRN
  Administered 2016-10-15: 100 ug via INTRAVENOUS

## 2016-10-15 MED ORDER — NITROGLYCERIN 1 MG/10 ML FOR IR/CATH LAB
INTRA_ARTERIAL | Status: AC
  Filled 2016-10-15: qty 10

## 2016-10-15 MED ORDER — DILTIAZEM LOAD VIA INFUSION
15.0000 mg | Freq: Once | INTRAVENOUS | Status: DC
  Filled 2016-10-15: qty 15

## 2016-10-15 MED ORDER — SODIUM CHLORIDE 0.9 % IV SOLN: INTRAVENOUS | Status: AC

## 2016-10-15 MED ORDER — IOPAMIDOL (ISOVUE-370) INJECTION 76%
INTRAVENOUS | Status: AC
  Administered 2016-10-15: 100 mL
  Filled 2016-10-15: qty 100

## 2016-10-15 MED ORDER — EPTIFIBATIDE 20 MG/10ML IV SOLN
INTRAVENOUS | Status: DC | PRN
  Administered 2016-10-15: .8 mg via INTRAVENOUS

## 2016-10-15 MED ORDER — LIDOCAINE HCL (CARDIAC) 20 MG/ML IV SOLN
INTRAVENOUS | Status: DC | PRN
  Administered 2016-10-15: 60 mg via INTRAVENOUS

## 2016-10-15 MED ORDER — PHENYLEPHRINE HCL 10 MG/ML IJ SOLN
INTRAMUSCULAR | Status: DC | PRN
  Administered 2016-10-15: 40 ug via INTRAVENOUS
  Administered 2016-10-15: 80 ug via INTRAVENOUS
  Administered 2016-10-15: 200 ug via INTRAVENOUS
  Administered 2016-10-15: 80 ug via INTRAVENOUS
  Administered 2016-10-15: 40 ug via INTRAVENOUS

## 2016-10-15 MED ORDER — PHENYLEPHRINE HCL 10 MG/ML IJ SOLN
0.0000 ug/min | INTRAVENOUS | Status: DC
  Administered 2016-10-15: 15 ug/min via INTRAVENOUS
  Administered 2016-10-17: 20 ug/min via INTRAVENOUS
  Filled 2016-10-15: qty 1

## 2016-10-15 MED ORDER — ACETAMINOPHEN 325 MG PO TABS: 650.0000 mg | ORAL_TABLET | ORAL | Status: DC | PRN

## 2016-10-15 MED ORDER — ROCURONIUM BROMIDE 100 MG/10ML IV SOLN
INTRAVENOUS | Status: DC | PRN
  Administered 2016-10-15: 30 mg via INTRAVENOUS
  Administered 2016-10-15: 70 mg via INTRAVENOUS

## 2016-10-15 MED ORDER — SODIUM CHLORIDE 0.9 % IV SOLN
INTRAVENOUS | Status: DC
  Administered 2016-10-16 – 2016-10-17 (×2): via INTRAVENOUS

## 2016-10-15 MED ORDER — SENNOSIDES-DOCUSATE SODIUM 8.6-50 MG PO TABS: 1.0000 | ORAL_TABLET | Freq: Every evening | ORAL | Status: DC | PRN

## 2016-10-15 MED ORDER — EPTIFIBATIDE 20 MG/10ML IV SOLN
INTRAVENOUS | Status: AC
  Filled 2016-10-15: qty 10

## 2016-10-15 MED ORDER — PHENYLEPHRINE HCL 10 MG/ML IJ SOLN
INTRAVENOUS | Status: DC | PRN
  Administered 2016-10-15: 30 ug/min via INTRAVENOUS

## 2016-10-15 MED ORDER — NICARDIPINE HCL IN NACL 20-0.86 MG/200ML-% IV SOLN: 5.0000 mg/h | INTRAVENOUS | Status: DC

## 2016-10-15 MED ORDER — LABETALOL HCL 5 MG/ML IV SOLN
20.0000 mg | Freq: Once | INTRAVENOUS | Status: DC
  Filled 2016-10-15: qty 4

## 2016-10-15 MED ORDER — FENTANYL CITRATE (PF) 100 MCG/2ML IJ SOLN
50.0000 ug | INTRAMUSCULAR | Status: DC | PRN
  Administered 2016-10-15: 50 ug via INTRAVENOUS
  Filled 2016-10-15: qty 2

## 2016-10-15 NOTE — Progress Notes (Signed)
Not a candidate for TPA

## 2016-10-15 NOTE — Progress Notes (Signed)
Pt is under the care of anesthesia at this time

## 2016-10-15 NOTE — Progress Notes (Signed)
Report given to Bethany at bedside. Right groin site with 63F exoseal in place is unremarkable. Left groin has 70F arterial line in place and is transduced. Pt under the care of anesthesia, unable to obtain NIH. Pulses 2+ dorsalis pedis bilaterally and 1+ posterior tibial bilaterally. Pt transported to CT then to 3M05 with no complications.

## 2016-10-15 NOTE — ED Notes (Signed)
Per EMS - pt from home. Pt felt tired per family at 0900 and went to rest on bed. At 0930, family noted left-sided facial droop, left arm and left leg weakness. Hx afib, taken off blood thinner 2 years ago after bleed from fall. Right-sided gaze noted upon EMS arrival to home.

## 2016-10-15 NOTE — Progress Notes (Signed)
Pt intubated and sedated, under care of anesthesia

## 2016-10-15 NOTE — Consult Note (Signed)
PULMONARY / CRITICAL CARE MEDICINE   Name: [name] MRN: [id number] DOB: 02/26/1941    ADMISSION DATE:  10/15/2016 CONSULTATION DATE:  12/30  REFERRING MD:  Otelia LimesLindzen   CHIEF COMPLAINT:  Ventilator management and critical care services   HISTORY OF PRESENT ILLNESS:   75 year old female w/ sig h/o Afib, type II DM. Was not on anticoagulation d/t h/o falls and SDH. Was in usual state of health until 12/30 am. Per family she was feeling "tired". Went to bed at 0900. At 0930 found by family w/ left sided weakness and facial droop. Transferred to [facility]. Code stroke initiated. CT head c/w: hyperdense right MCA bifurcation highly suspicious for emergent large vessel occlusion. Was intubated. Transferred to neuro-radiology where she underwent: cerebral angiogram & clot retrieval and revascularization. She returned to the ICU on ventilatory support. PCCM asked to assist w/ care   PAST MEDICAL HISTORY :  She  has a past medical history of Atrial fibrillation (HCC); Chronic back pain; GERD (gastroesophageal reflux disease); Hypertriglyceridemia; OSA on CPAP; Osteoarthritis; Pneumonia (~ 2000 X 1); Subdural hematoma (HCC) (july 2015); T12 compression fracture (HCC) (2012); and Type II diabetes mellitus (HCC).  PAST SURGICAL HISTORY: She  has a past surgical history that includes Appendectomy (2012); Total abdominal hysterectomy (1990); and Cataract extraction w/ intraocular lens  implant, bilateral (Bilateral, 2000's).  Allergies  Allergen Reactions  . Banana Swelling    Hands and feet  . Chocolate Itching  . Peanut-Containing Drug Products Itching    Throat itches, no swelling    No current facility-administered medications on file prior to encounter.    Current Outpatient Prescriptions on File Prior to Encounter  Medication Sig  . albuterol (PROVENTIL HFA;VENTOLIN HFA) 108 (90 BASE) MCG/ACT inhaler Inhale 2 puffs into the lungs every 6 (six) hours as needed for wheezing or shortness of  breath.  [name]. alendronate (FOSAMAX) 70 MG tablet Take 70 mg by mouth every Monday.   [name]. aspirin EC 81 MG tablet Take 81 mg by mouth daily.  [name]. atenolol (TENORMIN) 25 MG tablet Take 12.5 mg by mouth daily.   [name]. b complex vitamins tablet Take 1 tablet by mouth daily.  . bacitracin ointment Apply 1 application topically 2 (two) times daily. (Patient taking differently: Apply 1 application topically as needed for wound care. )  . Calcium-Vitamin D (CALTRATE 600 PLUS-VIT D PO) Take 1 tablet by mouth 2 (two) times daily.  . chlorpheniramine-HYDROcodone (TUSSIONEX PENNKINETIC ER) 10-8 MG/5ML LQCR Take 5 mLs by mouth every 12 (twelve) hours as needed for cough.  . docusate sodium 100 MG CAPS Take 100 mg by mouth 2 (two) times daily. (Patient taking differently: Take 100 mg by mouth 2 (two) times daily as needed (constipation). )  . fluticasone (FLONASE) 50 MCG/ACT nasal spray Place 1 spray into both nostrils daily as needed for allergies.   [name]. HYDROcodone-acetaminophen (NORCO/VICODIN) 5-325 MG per tablet Take 1 tablet by mouth every 6 (six) hours as needed for severe pain.  [name]. HYDROcodone-acetaminophen (NORCO/VICODIN) 5-325 MG per tablet Take 1 tablet by mouth every 6 (six) hours as needed for severe pain.  Boris Lown. Krill Oil 1000 MG CAPS Take 1,000 mg by mouth 2 (two) times daily.   [name]. levofloxacin (LEVAQUIN) 750 MG tablet Take 1 tablet (750 mg total) by mouth daily.  . metFORMIN (GLUCOPHAGE-XR) 500 MG 24 hr tablet Take 500 mg by mouth 2 (two) times daily.   . montelukast (SINGULAIR) 10 MG tablet Take 10 mg by mouth at  bedtime.   . Multiple Vitamin (DAILY MULTIVITAMIN PO) Take 1 tablet by mouth daily.  . pravastatin (PRAVACHOL) 10 MG tablet Take 10 mg by mouth daily.  . traMADol (ULTRAM) 50 MG tablet Take 1 tablet (50 mg total) by mouth 2 (two) times daily. (Patient taking differently: Take 50 mg by mouth 2 (two) times daily as needed. )    FAMILY HISTORY:  Her indicated that her mother is deceased. She indicated  that her father is deceased. She indicated that her sister is deceased. She indicated that her brother is alive. She indicated that her daughter is alive.    SOCIAL HISTORY: She  reports that she is a non-smoker but has been exposed to tobacco smoke. She has never used smokeless tobacco. She reports that she does not drink alcohol or use drugs.  REVIEW OF SYSTEMS:   Unable   SUBJECTIVE:  sedated  VITAL SIGNS: BP 98/75   Pulse 90   Temp 98.2 F (36.8 C) (Axillary)   Resp 14   Wt 136 lb 0.4 oz (61.7 kg)   SpO2 100%   BMI 27.47 kg/m   HEMODYNAMICS:    VENTILATOR SETTINGS: Vent Mode: PRVC FiO2 (%):  [100 %] 100 % Set Rate:  [14 bmp] 14 bmp Vt Set:  [500 mL] 500 mL PEEP:  [5 cmH20] 5 cmH20 Plateau Pressure:  [20 cmH20] 20 cmH20  INTAKE / OUTPUT: No intake/output data recorded.  PHYSICAL EXAMINATION: General:  Well nourished female, sedated on vent  Neuro:  Opens eyes to verbal request. Persistent left sided hemiparesis . Follows commands. Appears to have left sided neglect  HEENT:  Orally intubated. No JVD Cardiovascular:  Regular irreg II/VI SEM  Lungs:  Clear w/ equal chest rise  Abdomen:  Soft, not tender + bowel sounds  Musculoskeletal:  Good strength on right. Left sided weakness  Skin:  Warm and dry   LABS:  BMET  Recent Labs Lab 10/15/16 0946 10/15/16 0952  NA 143 142  K 3.9 3.8  CL 104 103  CO2 27  --   BUN 14 16  CREATININE 0.68 0.70  GLUCOSE 142* 143*    Electrolytes  Recent Labs Lab 10/15/16 0946  CALCIUM 10.4*    CBC  Recent Labs Lab 10/15/16 0946 10/15/16 0952  WBC 6.5  --   HGB 14.7 15.0  HCT 42.9 44.0  PLT 321  --     Coag's  Recent Labs Lab 10/15/16 0946  APTT 43*  INR 0.95    Sepsis Markers No results for input(s): LATICACIDVEN, PROCALCITON, O2SATVEN in the last 168 hours.  ABG No results for input(s): PHART, PCO2ART, PO2ART in the last 168 hours.  Liver Enzymes  Recent Labs Lab 10/15/16 0946  AST 22   ALT 16  ALKPHOS 42  BILITOT 0.6  ALBUMIN 4.0    Cardiac Enzymes No results for input(s): TROPONINI, PROBNP in the last 168 hours.  Glucose  Recent Labs Lab 10/15/16 0945  GLUCAP 134*    Imaging Ct Head Wo Contrast  Result Date: 10/15/2016 CLINICAL DATA:  Followup intervention for right M1 occlusion EXAM: CT HEAD WITHOUT CONTRAST TECHNIQUE: Contiguous axial images were obtained from the base of the skull through the vertex without intravenous contrast. COMPARISON:  Multiple exams earlier same day. FINDINGS: Brain: Diffuse hyperdensity of the right caudate and putamen probably secondary to contrast staining. No density suggestive of hemorrhage. Maintenance of gray-white differentiation in the peripheral right middle cerebral artery territory. No swelling or mass effect. No hydrocephalus  or extra-axial collection. Vascular: No asymmetric vessel hyperdensity. Skull: Negative Sinuses/Orbits: Mucosal thickening throughout the paranasal sinuses as seen previously. Orbits negative. Other: None IMPRESSION: Contrast staining of the basal ganglia on the right, typically seen following intervention. This does not necessarily indicate infarction of these structures. More peripheral right MCA territory appears normal with gray-white differentiation preservation and no swelling or shift. Electronically Signed   By: Paulina Fusi M.D.   On: 10/15/2016 14:35   Ct Cerebral Perfusion W Contrast  Result Date: 10/15/2016 CLINICAL DATA:  Code stroke. 75 year old female with left side weakness and right gaze deviation onset 0930 hours. Initial encounter. EXAM: CT PERFUSION BRAIN TECHNIQUE: Multiphase CT imaging of the brain was performed following IV bolus contrast injection. Subsequent parametric perfusion maps were calculated using RAPID software. CONTRAST:  50 mL Isovue 370 COMPARISON:  Noncontrast head CT 952 hours today. FINDINGS: CT Brain Perfusion Findings: CBF (<30%) Volume: 17mL Perfusion (Tmax>6.0s)  volume: Mismatch Volume: Infarction Location:Central white matter of the right MCA territory. Other findings: Perfusion source images are positive for distal right M1 or right bifurcation level occlusion. See series 21 images 183, 184, and 198. IMPRESSION: Positive for emergent large vessel occlusion at the distal right M1 or right MCA bifurcation. Core infarct volume estimated at 17 mL, with large area of right MCA territory penumbra (estimated at 160 mL). Unfortunately due to a scanner malfunction the planned CTA head and neck images were not acquired following this CT perfusion source images. However, I discussed the above findings by telephone with Dr. Agnes Lawrence on 10/15/2016 at 1027 hours, and we agree that the diagnostic information at hand is such that emergent Endovascular Neurointervention for reperfusion such proceed. Electronically Signed   By: Odessa Fleming M.D.   On: 10/15/2016 10:39   Ct Head Code Stroke W/o Cm  Result Date: 10/15/2016 CLINICAL DATA:  Code stroke. 75 year old female with left side weakness and right gaze deviation onset 0930 hours. Initial encounter. EXAM: CT HEAD WITHOUT CONTRAST TECHNIQUE: Contiguous axial images were obtained from the base of the skull through the vertex without intravenous contrast. COMPARISON:  Head CT without contrast 05/13/2014. FINDINGS: Brain: No midline shift, ventriculomegaly, mass effect, evidence of mass lesion, intracranial hemorrhage or evidence of cortically based acute infarction. Gray-white matter differentiation is within normal limits throughout the brain. Vascular: Positive for hyperdense right MCA bifurcation (series 204, image 22 and compare to the contralateral bifurcation on image 48). Calcified atherosclerosis at the skull base. Skull: No acute osseous abnormality identified. Sinuses/Orbits: Trace paranasal sinus mucosal thickening. Other: Rightward gaze deviation. Otherwise no acute orbit or scalp soft tissue findings. ASPECTS  [facility] Stroke Program Early CT Score) - Ganglionic level infarction (caudate, lentiform nuclei, internal capsule, insula, M1-M3 cortex): 7 - Supraganglionic infarction (M4-M6 cortex): 3 Total score (0-10 with 10 being normal): 10 IMPRESSION: 1. Positive for hyperdense right MCA bifurcation highly suspicious for emergent large vessel occlusion in this clinical setting. No intracranial hemorrhage or changes of acute cortically based infarct 2. ASPECTS is 10. 3. The above was relayed via text pager to [facility] Dr. Agnes Lawrence on 10/15/2016 at 1005 hours. I also discussed the findings with him by telephone at 1016 hours. Electronically Signed   By: Odessa Fleming M.D.   On: 10/15/2016 10:30     STUDIES:  CT head 12/30: 1. Positive for hyperdense right MCA bifurcation highly suspicious for emergent large vessel occlusion in this clinical setting. No intracranial hemorrhage or changes of acute cortically based infarct CT  head w/ perfusion 12/30: Positive for emergent large vessel occlusion at the distal right M1 or right MCA bifurcation.Core infarct volume estimated at 17 mL, with large area of right MCA territory penumbra (estimated at 160 mL).  CULTURES:  ANTIBIOTICS:   SIGNIFICANT EVENTS: 12/30 admitted to ED. Large right MCA CVA. To IR for revascularization   LINES/TUBES:   DISCUSSION: 75 year old female admitted 12/30 w/ large right MCA CVA w/ associated left sided weakness. Went to IR where she underwent successful clot retrieval and re-vascularization. Returned to ICU in vent.  -will cont full vent support over night -ensure adequate Ve and gas exchange -f/u CXR -SBP goal <180 -assess for extubation 12/31 after f/u neuro-imaging.   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  P:  Goal SBP <180 Cont tele   RENAL A:   No acute issues  P:   Strict I&O Avoid hypotension  Strict I&O F/u chemistry in am   GASTROINTESTINAL A:   NPO status  At risk for dysphagia  P:   NPO for now H2B for SUP Start tube feeds if not extubated 12/31.  Will need swallow eval after ETT removed    HEMATOLOGIC A:   No acute  P:  Trend CBC  Pas to LEs   INFECTIOUS A:   No acute  P:   Trend fever and WBC curve   ENDOCRINE A:   Mild hyperglycemia  P:   ssi      FAMILY  - Updates:   - Inter-disciplinary family meet or Palliative Care meeting due by:  1/5  My ccm ttime 30 minutes  Simonne MartinetPeter E Babcock ACNP-BC [facility] Pager # [phone] OR # [phone] if no answer 10/15/2016, 3:00 PM  Attending Note:  I have examined patient, reviewed labs, studies and notes. I have discussed the case with Kreg ShropshireP Babcock, and I agree with the data and plans as amended above. 75 yo woman, hx A fib, DM, remote ICH. Presented with a dense R MCA CVA. She was intubated and went for IR clot retrieval. Returned to ICU on MV. On eval she is ventilated, awake. she will follow some commands but has L hemiplegia and neglect. Head CT scan post procedure without edema or shift. We will assess for possible SBT in am 12/31, goal extubation depending on her deficits and airway protection.   Independent critical care time is 50 minutes.   Levy Pupaobert Shayden Bobier, MD, PhD 10/15/2016, 3:59 PM [place] [facility] [phone] or if no answer [phone]

## 2016-10-15 NOTE — Anesthesia Procedure Notes (Signed)
Procedure Name: Intubation Date/Time: 10/15/2016 11:29 AM Performed by: Rosiland OzMEYERS, Loleta Frommelt Pre-anesthesia Checklist: Patient identified, Emergency Drugs available, Suction available, Patient being monitored and Timeout performed Patient Re-evaluated:Patient Re-evaluated prior to inductionOxygen Delivery Method: Circle system utilized Preoxygenation: Pre-oxygenation with 100% oxygen Intubation Type: IV induction and Cricoid Pressure applied Laryngoscope Size: Miller and 3 Grade View: Grade II Tube type: Subglottic suction tube Tube size: 7.0 mm Number of attempts: 1 Airway Equipment and Method: Stylet Placement Confirmation: ETT inserted through vocal cords under direct vision,  breath sounds checked- equal and bilateral and positive ETCO2 Secured at: 21 cm Tube secured with: Tape Dental Injury: Teeth and Oropharynx as per pre-operative assessment

## 2016-10-15 NOTE — ED Provider Notes (Signed)
[facility]-EMERGENCY DEPT Provider Note   CSN: [id number] Arrival date & time: 10/15/16  [id number]   An emergency department physician performed an initial assessment on this suspected stroke patient at [id number].  History   Chief Complaint Chief Complaint  Patient presents with  . Code Stroke    HPI [name] is a 75 y.o. female.  Patient with hx afib, not on anticoag due to hx falls and SDH, presents with acute onset left sided weakness noted by family around 930 this AM.  Pt had appeared at baseline around 9 am today, but c/o feeling tired.  Patient noted to have marked left sided weakness and facial droop.  EMS transported. Patient limited historian - level 5 caveat.    The history is provided by the patient, a relative and the EMS personnel. The history is limited by the condition of the patient.    Past Medical History:  Diagnosis Date  . Atrial fibrillation (HCC)   . Chronic back pain    "mid back down into lower back" (08/25/2014)  . GERD (gastroesophageal reflux disease)   . Hypertriglyceridemia   . OSA on CPAP   . Osteoarthritis    "knees, hands, back" (08/25/2014)  . Pneumonia ~ 2000 X 1  . Subdural hematoma (HCC) july 2015   S/P fall while on Coumadin  . T12 compression fracture (HCC) 2012  . Type II diabetes mellitus [facility](HCC)     Patient Active Problem List   Diagnosis Date Noted  . HCAP (healthcare-associated pneumonia) 11/09/2014  . Weakness 08/25/2014  . Acute bronchitis 08/25/2014  . UTI (lower urinary tract infection) 08/25/2014  . Fracture of lumbar spine (HCC) 08/25/2014  . Subdural hematoma, post-traumatic (HCC) 05/12/2014  . Atrial fibrillation (HCC) 05/12/2014  . Diabetes mellitus (HCC) 05/12/2014  . OSA on CPAP 05/12/2014    Past Surgical History:  Procedure Laterality Date  . APPENDECTOMY  2012  . CATARACT EXTRACTION W/ INTRAOCULAR LENS  IMPLANT, BILATERAL Bilateral 2000's  . TOTAL ABDOMINAL HYSTERECTOMY  1990    OB History    No data available        Home Medications    Prior to Admission medications   Medication Sig Start Date End Date Taking? Authorizing Provider  albuterol (PROVENTIL HFA;VENTOLIN HFA) 108 (90 BASE) MCG/ACT inhaler Inhale 2 puffs into the lungs every 6 (six) hours as needed for wheezing or shortness of breath. 11/11/14   Courtney ParisEden W Jones, MD  alendronate (FOSAMAX) 70 MG tablet Take 70 mg by mouth every Monday.  04/30/14   Historical Provider, MD  aspirin EC 81 MG tablet Take 81 mg by mouth daily.    Historical Provider, MD  atenolol (TENORMIN) 25 MG tablet Take 12.5 mg by mouth daily.  03/15/14   Historical Provider, MD  b complex vitamins tablet Take 1 tablet by mouth daily.    Historical Provider, MD  bacitracin ointment Apply 1 application topically 2 (two) times daily. Patient taking differently: Apply 1 application topically as needed for wound care.  05/14/14   Russella DarAllison L Ellis, NP  Calcium-Vitamin D (CALTRATE 600 PLUS-VIT D PO) Take 1 tablet by mouth 2 (two) times daily.    Historical Provider, MD  chlorpheniramine-HYDROcodone (TUSSIONEX PENNKINETIC ER) 10-8 MG/5ML LQCR Take 5 mLs by mouth every 12 (twelve) hours as needed for cough. 11/11/14   Courtney ParisEden W Jones, MD  docusate sodium 100 MG CAPS Take 100 mg by mouth 2 (two) times daily. Patient taking differently: Take 100 mg by mouth 2 (two) times daily  as needed (constipation).  05/14/14   Russella Dar, NP  fluticasone (FLONASE) 50 MCG/ACT nasal spray Place 1 spray into both nostrils daily as needed for allergies.  02/18/14   Historical Provider, MD  HYDROcodone-acetaminophen (NORCO/VICODIN) 5-325 MG per tablet Take 1 tablet by mouth every 6 (six) hours as needed for severe pain.    Historical Provider, MD  HYDROcodone-acetaminophen (NORCO/VICODIN) 5-325 MG per tablet Take 1 tablet by mouth every 6 (six) hours as needed for severe pain. 11/11/14   Carolan Clines, MD  Boris Lown Oil 1000 MG CAPS Take 1,000 mg by mouth 2 (two) times daily.     Historical Provider, MD  levofloxacin  (LEVAQUIN) 750 MG tablet Take 1 tablet (750 mg total) by mouth daily. 11/11/14   Carolan Clines, MD  metFORMIN (GLUCOPHAGE-XR) 500 MG 24 hr tablet Take 500 mg by mouth 2 (two) times daily.  04/30/14   Historical Provider, MD  montelukast (SINGULAIR) 10 MG tablet Take 10 mg by mouth at bedtime.  08/15/14   Historical Provider, MD  Multiple Vitamin (DAILY MULTIVITAMIN PO) Take 1 tablet by mouth daily.    Historical Provider, MD  pravastatin (PRAVACHOL) 10 MG tablet Take 10 mg by mouth daily. 04/30/14   Historical Provider, MD  traMADol (ULTRAM) 50 MG tablet Take 1 tablet (50 mg total) by mouth 2 (two) times daily. Patient taking differently: Take 50 mg by mouth 2 (two) times daily as needed.  05/14/14   Russella Dar, NP    Family History Family History  Problem Relation Age of Onset  . Family history unknown: Yes    Social History Social History  Substance Use Topics  . Smoking status: Passive Smoke Exposure - Never Smoker  . Smokeless tobacco: Never Used  . Alcohol use No     Allergies   Banana; Chocolate; and Peanut-containing drug products   Review of Systems Review of Systems  Unable to perform ROS: Mental status change  level 5 caveat.    Physical Exam Updated Vital Signs BP 128/78   Pulse 105   Temp 97.3 F (36.3 C) (Axillary)   Resp 14   Wt 60.5 kg   SpO2 98%   BMI 26.94 kg/m   Physical Exam  Constitutional: She appears well-developed and well-nourished. No distress.  HENT:  Mouth/Throat: Oropharynx is clear and moist.  Eyes: Conjunctivae are normal. No scleral icterus.  Neck: Neck supple. No tracheal deviation present.  No bruits  Cardiovascular: Regular rhythm, normal heart sounds and intact distal pulses.   Irregular rhythm  Pulmonary/Chest: Effort normal and breath sounds normal. No respiratory distress.  Abdominal: Normal appearance. She exhibits no distension.  Musculoskeletal: She exhibits no edema.  Neurological: She is alert.  +facial droop.   Marked left sided weakness.  Skin: Skin is warm and dry. No rash noted. She is not diaphoretic.  Psychiatric:  Alert, content appearing  Nursing note and vitals reviewed.    ED Treatments / Results  Labs (all labs ordered are listed, but only abnormal results are displayed) Results for orders placed or performed during the hospital encounter of 10/15/16  Protime-INR  Result Value Ref Range   Prothrombin Time 12.7 11.4 - 15.2 seconds   INR 0.95   APTT  Result Value Ref Range   aPTT 43 (H) 24 - 36 seconds  CBC  Result Value Ref Range   WBC 6.5 4.0 - 10.5 K/uL   RBC 4.62 3.87 - 5.11 MIL/uL   Hemoglobin 14.7 12.0 - 15.0  g/dL   HCT 16.142.9 09.636.0 - 04.546.0 %   MCV 92.9 78.0 - 100.0 fL   MCH 31.8 26.0 - 34.0 pg   MCHC 34.3 30.0 - 36.0 g/dL   RDW 40.912.0 81.111.5 - 91.415.5 %   Platelets 321 150 - 400 K/uL  Differential  Result Value Ref Range   Neutrophils Relative % 64 %   Neutro Abs 4.1 1.7 - 7.7 K/uL   Lymphocytes Relative 27 %   Lymphs Abs 1.7 0.7 - 4.0 K/uL   Monocytes Relative 7 %   Monocytes Absolute 0.4 0.1 - 1.0 K/uL   Eosinophils Relative 3 %   Eosinophils Absolute 0.2 0.0 - 0.7 K/uL   Basophils Relative 1 %   Basophils Absolute 0.0 0.0 - 0.1 K/uL  Comprehensive metabolic panel  Result Value Ref Range   Sodium 143 135 - 145 mmol/L   Potassium 3.9 3.5 - 5.1 mmol/L   Chloride 104 101 - 111 mmol/L   CO2 27 22 - 32 mmol/L   Glucose, Bld 142 (H) 65 - 99 mg/dL   BUN 14 6 - 20 mg/dL   Creatinine, Ser 7.820.68 0.44 - 1.00 mg/dL   Calcium 95.610.4 (H) 8.9 - 10.3 mg/dL   Total Protein 7.4 6.5 - 8.1 g/dL   Albumin 4.0 3.5 - 5.0 g/dL   AST 22 15 - 41 U/L   ALT 16 14 - 54 U/L   Alkaline Phosphatase 42 38 - 126 U/L   Total Bilirubin 0.6 0.3 - 1.2 mg/dL   GFR calc non Af Amer >60 >60 mL/min   GFR calc Af Amer >60 >60 mL/min   Anion gap 12 5 - 15  I-stat troponin, ED  Result Value Ref Range   Troponin i, poc 0.00 0.00 - 0.08 ng/mL   Comment 3          CBG monitoring, ED  Result Value Ref  Range   Glucose-Capillary 134 (H) 65 - 99 mg/dL  I-Stat Chem 8, ED  Result Value Ref Range   Sodium 142 135 - 145 mmol/L   Potassium 3.8 3.5 - 5.1 mmol/L   Chloride 103 101 - 111 mmol/L   BUN 16 6 - 20 mg/dL   Creatinine, Ser 2.130.70 0.44 - 1.00 mg/dL   Glucose, Bld 086143 (H) 65 - 99 mg/dL   Calcium, Ion 5.781.21 4.691.15 - 1.40 mmol/L   TCO2 28 0 - 100 mmol/L   Hemoglobin 15.0 12.0 - 15.0 g/dL   HCT 62.944.0 52.836.0 - 41.346.0 %   Ct Cerebral Perfusion W Contrast  Result Date: 10/15/2016 CLINICAL DATA:  Code stroke. 75 year old female with left side weakness and right gaze deviation onset 0930 hours. Initial encounter. EXAM: CT PERFUSION BRAIN TECHNIQUE: Multiphase CT imaging of the brain was performed following IV bolus contrast injection. Subsequent parametric perfusion maps were calculated using RAPID software. CONTRAST:  50 mL Isovue 370 COMPARISON:  Noncontrast head CT 952 hours today. FINDINGS: CT Brain Perfusion Findings: CBF (<30%) Volume: 17mL Perfusion (Tmax>6.0s) volume: 160mL Mismatch Volume: 143mL Infarction Location:Central white matter of the right MCA territory. Other findings: Perfusion source images are positive for distal right M1 or right bifurcation level occlusion. See series 21 images 183, 184, and 198. IMPRESSION: Positive for emergent large vessel occlusion at the distal right M1 or right MCA bifurcation. Core infarct volume estimated at 17 mL, with large area of right MCA territory penumbra (estimated at 160 mL). Unfortunately due to a scanner malfunction the planned CTA head and neck images  were not acquired following this CT perfusion source images. However, I discussed the above findings by telephone with Dr. Agnes Lawrence on 10/15/2016 at 1027 hours, and we agree that the diagnostic information at hand is such that emergent Endovascular Neurointervention for reperfusion such proceed. Electronically Signed   By: Odessa Fleming M.D.   On: 10/15/2016 10:39   Ct Head Code Stroke W/o Cm  Result Date:  10/15/2016 CLINICAL DATA:  Code stroke. 75 year old female with left side weakness and right gaze deviation onset 0930 hours. Initial encounter. EXAM: CT HEAD WITHOUT CONTRAST TECHNIQUE: Contiguous axial images were obtained from the base of the skull through the vertex without intravenous contrast. COMPARISON:  Head CT without contrast 05/13/2014. FINDINGS: Brain: No midline shift, ventriculomegaly, mass effect, evidence of mass lesion, intracranial hemorrhage or evidence of cortically based acute infarction. Gray-white matter differentiation is within normal limits throughout the brain. Vascular: Positive for hyperdense right MCA bifurcation (series 204, image 22 and compare to the contralateral bifurcation on image 48). Calcified atherosclerosis at the skull base. Skull: No acute osseous abnormality identified. Sinuses/Orbits: Trace paranasal sinus mucosal thickening. Other: Rightward gaze deviation. Otherwise no acute orbit or scalp soft tissue findings. ASPECTS [facility] Stroke Program Early CT Score) - Ganglionic level infarction (caudate, lentiform nuclei, internal capsule, insula, M1-M3 cortex): 7 - Supraganglionic infarction (M4-M6 cortex): 3 Total score (0-10 with 10 being normal): 10 IMPRESSION: 1. Positive for hyperdense right MCA bifurcation highly suspicious for emergent large vessel occlusion in this clinical setting. No intracranial hemorrhage or changes of acute cortically based infarct 2. ASPECTS is 10. 3. The above was relayed via text pager to [facility] Dr. Agnes Lawrence on 10/15/2016 at 1005 hours. I also discussed the findings with him by telephone at 1016 hours. Electronically Signed   By: Odessa Fleming M.D.   On: 10/15/2016 10:30    EKG  EKG Interpretation  Date/Time:  Saturday October 15 2016 10:35:05 EST Ventricular Rate:  104 PR Interval:    QRS Duration: 82 QT Interval:  333 QTC Calculation: 438 R Axis:   65 Text Interpretation:  Atrial fibrillation Confirmed by Denton Lank  MD, Caryn Bee  ([id number]) on 10/15/2016 10:51:48 AM       Radiology Ct Cerebral Perfusion W Contrast  Result Date: 10/15/2016 CLINICAL DATA:  Code stroke. 75 year old female with left side weakness and right gaze deviation onset 0930 hours. Initial encounter. EXAM: CT PERFUSION BRAIN TECHNIQUE: Multiphase CT imaging of the brain was performed following IV bolus contrast injection. Subsequent parametric perfusion maps were calculated using RAPID software. CONTRAST:  50 mL Isovue 370 COMPARISON:  Noncontrast head CT 952 hours today. FINDINGS: CT Brain Perfusion Findings: CBF (<30%) Volume: 17mL Perfusion (Tmax>6.0s) volume: Mismatch Volume: Infarction Location:Central white matter of the right MCA territory. Other findings: Perfusion source images are positive for distal right M1 or right bifurcation level occlusion. See series 21 images 183, 184, and 198. IMPRESSION: Positive for emergent large vessel occlusion at the distal right M1 or right MCA bifurcation. Core infarct volume estimated at 17 mL, with large area of right MCA territory penumbra (estimated at 160 mL). Unfortunately due to a scanner malfunction the planned CTA head and neck images were not acquired following this CT perfusion source images. However, I discussed the above findings by telephone with Dr. Agnes Lawrence on 10/15/2016 at 1027 hours, and we agree that the diagnostic information at hand is such that emergent Endovascular Neurointervention for reperfusion such proceed. Electronically Signed   By: Althea Grimmer.D.  On: 10/15/2016 10:39   Ct Head Code Stroke W/o Cm  Result Date: 10/15/2016 CLINICAL DATA:  Code stroke. 75 year old female with left side weakness and right gaze deviation onset 0930 hours. Initial encounter. EXAM: CT HEAD WITHOUT CONTRAST TECHNIQUE: Contiguous axial images were obtained from the base of the skull through the vertex without intravenous contrast. COMPARISON:  Head CT without contrast 05/13/2014. FINDINGS: Brain: No  midline shift, ventriculomegaly, mass effect, evidence of mass lesion, intracranial hemorrhage or evidence of cortically based acute infarction. Gray-white matter differentiation is within normal limits throughout the brain. Vascular: Positive for hyperdense right MCA bifurcation (series 204, image 22 and compare to the contralateral bifurcation on image 48). Calcified atherosclerosis at the skull base. Skull: No acute osseous abnormality identified. Sinuses/Orbits: Trace paranasal sinus mucosal thickening. Other: Rightward gaze deviation. Otherwise no acute orbit or scalp soft tissue findings. ASPECTS [facility] Stroke Program Early CT Score) - Ganglionic level infarction (caudate, lentiform nuclei, internal capsule, insula, M1-M3 cortex): 7 - Supraganglionic infarction (M4-M6 cortex): 3 Total score (0-10 with 10 being normal): 10 IMPRESSION: 1. Positive for hyperdense right MCA bifurcation highly suspicious for emergent large vessel occlusion in this clinical setting. No intracranial hemorrhage or changes of acute cortically based infarct 2. ASPECTS is 10. 3. The above was relayed via text pager to [facility] Dr. Agnes Lawrence on 10/15/2016 at 1005 hours. I also discussed the findings with him by telephone at 1016 hours. Electronically Signed   By: Odessa Fleming M.D.   On: 10/15/2016 10:30    Procedures Procedures (including critical care time)  Medications Ordered in ED Medications  iopamidol (ISOVUE-370) 76 % injection (100 mLs  Contrast Given 10/15/16 1008)     Initial Impression / Assessment and Plan / ED Course  I have reviewed the triage vital signs and the nursing notes.  Pertinent labs & imaging results that were available during my care of the patient were reviewed by me and considered in my medical decision making (see chart for details).  Clinical Course     Code stroke called.  Iv ns. Continuous pulse ox and monitor.   Labs. Stat imaging.   Neurology/stroke team at bedside.  Given hx  sdh, neurology indicates not candidate for tpa, but recommends endovascular procedure.  IR consulted.   Reassessment of pt, no acute change in exam as compared to initial.  Patient taken emergently to Interventional Radiology.  CRITICAL CARE  RE Acute/severe CVA, left hemiparesis, emergent IR procedure.  Performed by: Suzi Roots Total critical care time: 35 minutes Critical care time was exclusive of separately billable procedures and treating other patients. Critical care was necessary to treat or prevent imminent or life-threatening deterioration. Critical care was time spent personally by me on the following activities: development of treatment plan with patient and/or surrogate as well as nursing, discussions with consultants, evaluation of patient's response to treatment, examination of patient, obtaining history from patient or surrogate, ordering and performing treatments and interventions, ordering and review of laboratory studies, ordering and review of radiographic studies, pulse oximetry and re-evaluation of patient's condition.   Final Clinical Impressions(s) / ED Diagnoses   Final diagnoses:  Stroke (cerebrum) (HCC)  Acute embolic stroke [facility])    New Prescriptions New Prescriptions   No medications on file     Cathren Laine, MD 10/15/16 1124

## 2016-10-15 NOTE — Procedures (Signed)
S/P RT common carotid arteriogram,followed by complete revascularization of  occluded RT MCA M1 segment with x1 pass with the solitaireFR 40 mm retrieval device and 1.8 mg of superselective intracranial IA Integrelin with achievement of a TICI 3 reperfusion

## 2016-10-15 NOTE — Anesthesia Postprocedure Evaluation (Signed)
Anesthesia Post Note  Patient: [name]  Procedure(s) Performed: Procedure(s) (LRB): RADIOLOGY WITH ANESTHESIA (N/A)  Patient location during evaluation: ICU Anesthesia Type: General Level of consciousness: sedated Pain management: pain level controlled Vital Signs Assessment: post-procedure vital signs reviewed and stable Respiratory status: patient remains intubated per anesthesia plan Cardiovascular status: stable Anesthetic complications: no       Last Vitals:  Vitals:   10/15/16 1400 10/15/16 1410  BP: 99/69 94/64  Pulse: (!) 101 98  Resp: 16 14  Temp:      Last Pain:  Vitals:   10/15/16 1350  TempSrc: Axillary  PainSc:                  Gladies Sofranko

## 2016-10-15 NOTE — Consult Note (Addendum)
Referring Physician: Dr. Ashok Cordia    Chief Complaint: Acute onset of left hemiplegia  HPI: [name] is an 75 y.o. female who presents with acute onset of left facial droop and left hemiplegia. She had symptoms of fatigue this morning and then went to rest on her bed at 0900. At 0930 her family noted left arm/leg weakness and facial droop. EMS was called and she was transported to [facility] ED as a Code Stroke. EMS also noted rightward gaze deviation.   She has a history of atrial fibrillation and was on a blood thinner, but this was discontinued after she fell and sustained a subdural bleed. On ASA at home. Does not speak [place]. She is ethnic [place] and emigrated from [place]. She speaks both [place] and [place].  LSN: 8:30 AM tPA Given: No: Due to prior history of subdural hemorrhage NIHSS: 18  Past Medical History:  Diagnosis Date  . Atrial fibrillation ([place])   . Chronic back pain    "mid back down into lower back" (08/25/2014)  . GERD (gastroesophageal reflux disease)   . Hypertriglyceridemia   . OSA on CPAP   . Osteoarthritis    "knees, hands, back" (08/25/2014)  . Pneumonia ~ 2000 X 1  . Subdural hematoma ([place]) july 2015   S/P fall while on Coumadin  . T12 compression fracture ([place]) 2012  . Type II diabetes mellitus ([place])     Past Surgical History:  Procedure Laterality Date  . APPENDECTOMY  2012  . CATARACT EXTRACTION W/ INTRAOCULAR LENS  IMPLANT, BILATERAL Bilateral 2000's  . TOTAL ABDOMINAL HYSTERECTOMY  1990    Family History  Problem Relation Age of Onset  . Family history unknown: Yes   Social History:  reports that she is a non-smoker but has been exposed to tobacco smoke. She has never used smokeless tobacco. She reports that she does not drink alcohol or use drugs.  Allergies:  Allergies  Allergen Reactions  . Banana Swelling    Hands and feet  . Chocolate Itching  . Peanut-Containing Drug Products Itching    Throat itches, no swelling    Medications:   Prior to Admission:  Prescriptions Prior to Admission  Medication Sig Dispense Refill Last Dose  . albuterol (PROVENTIL HFA;VENTOLIN HFA) 108 (90 BASE) MCG/ACT inhaler Inhale 2 puffs into the lungs every 6 (six) hours as needed for wheezing or shortness of breath. 1 Inhaler 2   . alendronate (FOSAMAX) 70 MG tablet Take 70 mg by mouth every Monday.    Past Week at Unknown time  . aspirin EC 81 MG tablet Take 81 mg by mouth daily.   11/08/2014 at Unknown time  . atenolol (TENORMIN) 25 MG tablet Take 12.5 mg by mouth daily.    11/08/2014 at 1930  . b complex vitamins tablet Take 1 tablet by mouth daily.   11/08/2014 at Unknown time  . bacitracin ointment Apply 1 application topically 2 (two) times daily. (Patient taking differently: Apply 1 application topically as needed for wound care. ) 120 g 0 Unk  . Calcium-Vitamin D (CALTRATE 600 PLUS-VIT D PO) Take 1 tablet by mouth 2 (two) times daily.   11/08/2014 at Unknown time  . chlorpheniramine-HYDROcodone (TUSSIONEX PENNKINETIC ER) 10-8 MG/5ML LQCR Take 5 mLs by mouth every 12 (twelve) hours as needed for cough. 115 mL 0   . docusate sodium 100 MG CAPS Take 100 mg by mouth 2 (two) times daily. (Patient taking differently: Take 100 mg by mouth 2 (two) times daily as needed (  constipation). ) 10 capsule 0 Past Month at Unknown time  . fluticasone (FLONASE) 50 MCG/ACT nasal spray Place 1 spray into both nostrils daily as needed for allergies.    Past Month at Unknown time  . HYDROcodone-acetaminophen (NORCO/VICODIN) 5-325 MG per tablet Take 1 tablet by mouth every 6 (six) hours as needed for severe pain.   Past Month at Unknown time  . HYDROcodone-acetaminophen (NORCO/VICODIN) 5-325 MG per tablet Take 1 tablet by mouth every 6 (six) hours as needed for severe pain. 30 tablet 0   . Krill Oil 1000 MG CAPS Take 1,000 mg by mouth 2 (two) times daily.    11/08/2014 at Unknown time  . levofloxacin (LEVAQUIN) 750 MG tablet Take 1 tablet (750 mg total) by mouth daily. 6  tablet 0   . metFORMIN (GLUCOPHAGE-XR) 500 MG 24 hr tablet Take 500 mg by mouth 2 (two) times daily.    11/08/2014 at Unknown time  . montelukast (SINGULAIR) 10 MG tablet Take 10 mg by mouth at bedtime.   12 11/08/2014 at Unknown time  . Multiple Vitamin (DAILY MULTIVITAMIN PO) Take 1 tablet by mouth daily.   11/08/2014 at Unknown time  . pravastatin (PRAVACHOL) 10 MG tablet Take 10 mg by mouth daily.   11/08/2014 at Unknown time  . traMADol (ULTRAM) 50 MG tablet Take 1 tablet (50 mg total) by mouth 2 (two) times daily. (Patient taking differently: Take 50 mg by mouth 2 (two) times daily as needed. ) 30 tablet 0 11/09/2014 at Unknown time    ROS: Unable to obtain at time of acute stroke evaluation due to language barrier.   Physical Examination: Blood pressure 128/78, pulse 105, temperature 97.3 F (36.3 C), temperature source Axillary, resp. rate 14, weight 60.5 kg (133 lb 6.1 oz), SpO2 98 %.   HEENT: [place]/AT Lungs: Respirations unlabored.  Ext: Warm and well-perfused.   Neurologic Examination: Ment: Able to answer daughter's questions in [place] with, per daughter, normal fluency but significant dysarthria. One nonsensical response to a question per daughter. Able to name objects and follow commands. Initially awake, she became drowsy during the stroke evaluation.  CN: PERRL. Able to detect moving fingers in right and left visual fields simultaneously. Eye deviation to right with inability to cross midline to the left; this cannot be overcome with doll's eye maneuver. Absent sensation to left face. Prominent left facial droop. Tongue protrudes to the left.  Motor: 5/5 RUE and RLE. 0/5 LUE and LLE.  Sensory: Insensate to LUE and LLE.  Reflexes: No asymmetry noted.  Cerebellar: No ataxia with FNF on right. Unable to perform on left.  Gait: Unable to assess.   Results for orders placed or performed during the hospital encounter of 10/15/16 (from the past 48 hour(s))  CBG monitoring, ED      Status: Abnormal   Collection Time: 10/15/16  9:45 AM  Result Value Ref Range   Glucose-Capillary 134 (H) 65 - 99 mg/dL  Protime-INR     Status: None   Collection Time: 10/15/16  9:46 AM  Result Value Ref Range   Prothrombin Time 12.7 11.4 - 15.2 seconds   INR 0.95   APTT     Status: Abnormal   Collection Time: 10/15/16  9:46 AM  Result Value Ref Range   aPTT 43 (H) 24 - 36 seconds    Comment:        IF BASELINE aPTT IS ELEVATED, SUGGEST PATIENT RISK ASSESSMENT BE USED TO DETERMINE APPROPRIATE ANTICOAGULANT THERAPY.   CBC  Status: None   Collection Time: 10/15/16  9:46 AM  Result Value Ref Range   WBC 6.5 4.0 - 10.5 K/uL   RBC 4.62 3.87 - 5.11 MIL/uL   Hemoglobin 14.7 12.0 - 15.0 g/dL   HCT 42.9 36.0 - 46.0 %   MCV 92.9 78.0 - 100.0 fL   MCH 31.8 26.0 - 34.0 pg   MCHC 34.3 30.0 - 36.0 g/dL   RDW 12.0 11.5 - 15.5 %   Platelets 321 150 - 400 K/uL  Differential     Status: None   Collection Time: 10/15/16  9:46 AM  Result Value Ref Range   Neutrophils Relative % 64 %   Neutro Abs 4.1 1.7 - 7.7 K/uL   Lymphocytes Relative 27 %   Lymphs Abs 1.7 0.7 - 4.0 K/uL   Monocytes Relative 7 %   Monocytes Absolute 0.4 0.1 - 1.0 K/uL   Eosinophils Relative 3 %   Eosinophils Absolute 0.2 0.0 - 0.7 K/uL   Basophils Relative 1 %   Basophils Absolute 0.0 0.0 - 0.1 K/uL  Comprehensive metabolic panel     Status: Abnormal   Collection Time: 10/15/16  9:46 AM  Result Value Ref Range   Sodium 143 135 - 145 mmol/L   Potassium 3.9 3.5 - 5.1 mmol/L   Chloride 104 101 - 111 mmol/L   CO2 27 22 - 32 mmol/L   Glucose, Bld 142 (H) 65 - 99 mg/dL   BUN 14 6 - 20 mg/dL   Creatinine, Ser 0.68 0.44 - 1.00 mg/dL   Calcium 10.4 (H) 8.9 - 10.3 mg/dL   Total Protein 7.4 6.5 - 8.1 g/dL   Albumin 4.0 3.5 - 5.0 g/dL   AST 22 15 - 41 U/L   ALT 16 14 - 54 U/L   Alkaline Phosphatase 42 38 - 126 U/L   Total Bilirubin 0.6 0.3 - 1.2 mg/dL   GFR calc non Af Amer >60 >60 mL/min   GFR calc Af Amer >60  >60 mL/min    Comment: (NOTE) The eGFR has been calculated using the CKD EPI equation. This calculation has not been validated in all clinical situations. eGFR's persistently <60 mL/min signify possible Chronic Kidney Disease.    Anion gap 12 5 - 15  I-stat troponin, ED     Status: None   Collection Time: 10/15/16  9:50 AM  Result Value Ref Range   Troponin i, poc 0.00 0.00 - 0.08 ng/mL   Comment 3            Comment: Due to the release kinetics of cTnI, a negative result within the first hours of the onset of symptoms does not rule out myocardial infarction with certainty. If myocardial infarction is still suspected, repeat the test at appropriate intervals.   I-Stat Chem 8, ED     Status: Abnormal   Collection Time: 10/15/16  9:52 AM  Result Value Ref Range   Sodium 142 135 - 145 mmol/L   Potassium 3.8 3.5 - 5.1 mmol/L   Chloride 103 101 - 111 mmol/L   BUN 16 6 - 20 mg/dL   Creatinine, Ser 0.70 0.44 - 1.00 mg/dL   Glucose, Bld 143 (H) 65 - 99 mg/dL   Calcium, Ion 1.21 1.15 - 1.40 mmol/L   TCO2 28 0 - 100 mmol/L   Hemoglobin 15.0 12.0 - 15.0 g/dL   HCT 44.0 36.0 - 46.0 %   Ct Cerebral Perfusion W Contrast  Result Date: 10/15/2016 CLINICAL DATA:  Code stroke. 75 year old female with left side weakness and right gaze deviation onset 0930 hours. Initial encounter. EXAM: CT PERFUSION BRAIN TECHNIQUE: Multiphase CT imaging of the brain was performed following IV bolus contrast injection. Subsequent parametric perfusion maps were calculated using RAPID software. CONTRAST:  50 mL Isovue 370 COMPARISON:  Noncontrast head CT 952 hours today. FINDINGS: CT Brain Perfusion Findings: CBF (<30%) Volume: 47m Perfusion (Tmax>6.0s) volume: 1610mMismatch Volume: 14352mnfarction Location:Central white matter of the right MCA territory. Other findings: Perfusion source images are positive for distal right M1 or right bifurcation level occlusion. See series 21 images 183, 184, and 198.  IMPRESSION: Positive for emergent large vessel occlusion at the distal right M1 or right MCA bifurcation. Core infarct volume estimated at 17 mL, with large area of right MCA territory penumbra (estimated at 160 mL). Unfortunately due to a scanner malfunction the planned CTA head and neck images were not acquired following this CT perfusion source images. However, I discussed the above findings by telephone with Dr. E. Bruce Donath 10/15/2016 at 1027 hours, and we agree that the diagnostic information at hand is such that emergent Endovascular Neurointervention for reperfusion such proceed. Electronically Signed   By: H  Genevie AnnD.   On: 10/15/2016 10:39   Ct Head Code Stroke W/o Cm  Result Date: 10/15/2016 CLINICAL DATA:  Code stroke. [age]ar old female with left side weakness and right gaze deviation onset 0930 hours. Initial encounter. EXAM: CT HEAD WITHOUT CONTRAST TECHNIQUE: Contiguous axial images were obtained from the base of the skull through the vertex without intravenous contrast. COMPARISON:  Head CT without contrast 05/13/2014. FINDINGS: Brain: No midline shift, ventriculomegaly, mass effect, evidence of mass lesion, intracranial hemorrhage or evidence of cortically based acute infarction. Gray-white matter differentiation is within normal limits throughout the brain. Vascular: Positive for hyperdense right MCA bifurcation (series 204, image 22 and compare to the contralateral bifurcation on image 48). Calcified atherosclerosis at the skull base. Skull: No acute osseous abnormality identified. Sinuses/Orbits: Trace paranasal sinus mucosal thickening. Other: Rightward gaze deviation. Otherwise no acute orbit or scalp soft tissue findings. ASPECTS ([facility] Early CT Score) - Ganglionic level infarction (caudate, lentiform nuclei, internal capsule, insula, M1-M3 cortex): 7 - Supraganglionic infarction (M4-M6 cortex): 3 Total score (0-10 with 10 being normal): 10 IMPRESSION: 1. Positive for  hyperdense right MCA bifurcation highly suspicious for emergent large vessel occlusion in this clinical setting. No intracranial hemorrhage or changes of acute cortically based infarct 2. ASPECTS is 10. 3. The above was rel[facility] via text pager [facility] E. Bruce Donath 10/15/2016 at 1005 hours. I also discussed the findings with him by telephone at 1016 hours. Electronically Signed   By: H  Genevie AnnD.   On: 10/15/2016 10:30    Assessment: 75 11o. female with acute onset of left hemiplegia and facial droop. 1. CT head reveals no hemorrhage or acute hypodensity. Dense right MCA sign noted.  2. CT perfusion study shows ischemic core of 17 cc with penumbra of approximately 160 cc. Limited CTA imaging from perfusion study confirms right MCA occlusion.  3. Conventional CTA unsuccessful due to technical failure of CT scanner following contrast bolus.  4. Not an IV tPA candidate due to history of subdural hemorrhage.  5. The patient is an endovascular candidate. Discussed with Dr. DevEstanislado PandyIR team being called in for emergent catheter angiogram and possible clot retrieval procedure.  6. DM2.  7. Atrial fibrillation.  8. History of subdural hemorrhage.    Plan: 1.  Admit to MICU following endovascular procedure.  2. BP management.  3. No antiplatelet medications or anticoagulants. DVT prophylaxis with SCDs.  4. Repeat CT in 24 hours.  5. Consider restarting ASA after 24 hours if repeat CT is negative for hemorrhagic conversion.  6. PT/OT/Speech.  7. MRI brain.  8. TTE.  9. Telemetry monitoring 10. Frequent neuro checks 11. HgbA1c, fasting lipid panel   '@Electronically'  signed: Dr. Kerney Elbe 10/15/2016, 11:09 AM

## 2016-10-15 NOTE — Transfer of Care (Signed)
Immediate Anesthesia Transfer of Care Note  Patient: [name]  Procedure(s) Performed: Procedure(s): RADIOLOGY WITH ANESTHESIA (N/A)  Patient Location: NICU  Anesthesia Type:General  Level of Consciousness: sedated, unresponsive and Patient remains intubated per anesthesia plan  Airway & Oxygen Therapy: Patient remains intubated per anesthesia plan and Patient placed on Ventilator (see vital sign flow sheet for setting)  Post-op Assessment: Report given to RN and Post -op Vital signs reviewed and stable  Post vital signs: Reviewed and stable  Last Vitals:  Vitals:   10/15/16 1034 10/15/16 1045  BP: 121/88 128/78  Pulse: 114 105  Resp: 14 14  Temp: 36.3 C     Last Pain:  Vitals:   10/15/16 1042  TempSrc:   PainSc: 8          Complications: No apparent anesthesia complications

## 2016-10-15 NOTE — Anesthesia Preprocedure Evaluation (Signed)
Anesthesia Evaluation  Patient identified by MRN, date of birth, ID band Patient confused    Reviewed: Unable to perform ROS - Chart review onlyPreop documentation limited or incomplete due to emergent nature of procedure.  Airway Mallampati: III  TM Distance: >3 FB Neck ROM: Limited    Dental  (+) Upper Dentures, Teeth Intact   Pulmonary sleep apnea ,    breath sounds clear to auscultation       Cardiovascular + dysrhythmias Atrial Fibrillation  Rhythm:Irregular     Neuro/Psych CVA, Residual Symptoms    GI/Hepatic GERD  ,  Endo/Other  diabetes  Renal/GU      Musculoskeletal  (+) Arthritis ,   Abdominal   Peds  Hematology negative hematology ROS (+)   Anesthesia Other Findings   Reproductive/Obstetrics                            Anesthesia Physical Anesthesia Plan  ASA: III and emergent  Anesthesia Plan: General   Post-op Pain Management:    Induction: Intravenous and Rapid sequence  Airway Management Planned: Oral ETT  Additional Equipment:   Intra-op Plan:   Post-operative Plan: Post-operative intubation/ventilation  Informed Consent: I have reviewed the patients History and Physical, chart, labs and discussed the procedure including the risks, benefits and alternatives for the proposed anesthesia with the patient or authorized representative who has indicated his/her understanding and acceptance.   Dental advisory given  Plan Discussed with: CRNA and Surgeon  Anesthesia Plan Comments:         Anesthesia Quick Evaluation

## 2016-10-15 NOTE — Progress Notes (Signed)
Pt intubated and sedated. Under the care of anesthesia at this time.

## 2016-10-16 ENCOUNTER — Inpatient Hospital Stay (HOSPITAL_COMMUNITY): Payer: Medicare Other

## 2016-10-16 ENCOUNTER — Encounter (HOSPITAL_COMMUNITY): Payer: Self-pay | Admitting: Interventional Radiology

## 2016-10-16 LAB — CBC WITH DIFFERENTIAL/PLATELET
BASOS ABS: 0 10*3/uL (ref 0.0–0.1)
BASOS PCT: 1 %
EOS ABS: 0.1 10*3/uL (ref 0.0–0.7)
Eosinophils Relative: 1 %
HEMATOCRIT: 33 % — AB (ref 36.0–46.0)
Hemoglobin: 11.1 g/dL — ABNORMAL LOW (ref 12.0–15.0)
Lymphocytes Relative: 18 %
Lymphs Abs: 1.5 10*3/uL (ref 0.7–4.0)
MCH: 30.8 pg (ref 26.0–34.0)
MCHC: 33.6 g/dL (ref 30.0–36.0)
MCV: 91.7 fL (ref 78.0–100.0)
MONO ABS: 0.5 10*3/uL (ref 0.1–1.0)
MONOS PCT: 6 %
NEUTROS ABS: 6.2 10*3/uL (ref 1.7–7.7)
Neutrophils Relative %: 74 %
PLATELETS: 289 10*3/uL (ref 150–400)
RBC: 3.6 MIL/uL — ABNORMAL LOW (ref 3.87–5.11)
RDW: 12.1 % (ref 11.5–15.5)
WBC: 8.4 10*3/uL (ref 4.0–10.5)

## 2016-10-16 LAB — BASIC METABOLIC PANEL
ANION GAP: 9 (ref 5–15)
BUN: 9 mg/dL (ref 6–20)
CALCIUM: 8.4 mg/dL — AB (ref 8.9–10.3)
CO2: 19 mmol/L — AB (ref 22–32)
CREATININE: 0.58 mg/dL (ref 0.44–1.00)
Chloride: 111 mmol/L (ref 101–111)
Glucose, Bld: 125 mg/dL — ABNORMAL HIGH (ref 65–99)
Potassium: 3.5 mmol/L (ref 3.5–5.1)
SODIUM: 139 mmol/L (ref 135–145)

## 2016-10-16 LAB — LIPID PANEL
CHOL/HDL RATIO: 4.4 ratio
Cholesterol: 128 mg/dL (ref 0–200)
HDL: 29 mg/dL — ABNORMAL LOW (ref >40)
LDL CALC: 59 mg/dL (ref 0–99)
Triglycerides: 198 mg/dL — ABNORMAL HIGH (ref <150)
VLDL: 40 mg/dL (ref 0–40)

## 2016-10-16 LAB — CBC
HEMATOCRIT: 36 % (ref 36.0–46.0)
Hemoglobin: 11.9 g/dL — ABNORMAL LOW (ref 12.0–15.0)
MCH: 31.5 pg (ref 26.0–34.0)
MCHC: 33.1 g/dL (ref 30.0–36.0)
MCV: 95.2 fL (ref 78.0–100.0)
PLATELETS: 272 10*3/uL (ref 150–400)
RBC: 3.78 MIL/uL — ABNORMAL LOW (ref 3.87–5.11)
RDW: 12.6 % (ref 11.5–15.5)
WBC: 10 10*3/uL (ref 4.0–10.5)

## 2016-10-16 LAB — GLUCOSE, CAPILLARY: GLUCOSE-CAPILLARY: 122 mg/dL — AB (ref 65–99)

## 2016-10-16 MED ORDER — ASPIRIN 300 MG RE SUPP
300.0000 mg | Freq: Every day | RECTAL | Status: DC
  Administered 2016-10-16 – 2016-10-18 (×3): 300 mg via RECTAL
  Filled 2016-10-16 (×3): qty 1

## 2016-10-16 NOTE — Progress Notes (Signed)
Lt 4 fr sheath removed with v-pad at 11:05. Hemostasis achieved at 11:20. No complications. Pressure dressing applied to site. Showed to Solectron CorporationJamie RN.

## 2016-10-16 NOTE — Progress Notes (Signed)
MRI brain resulted. 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 No gross hemorrhage. MRI performed > 24 hours since endovascular procedure. Benefits of starting ASA outweighs risks. Starting ASA 300 mg per rectum qd.   Electronically signed: Dr. Caryl PinaEric Ciara Kagan

## 2016-10-16 NOTE — Progress Notes (Signed)
PT Cancellation Note  Patient Details Name: [name] MRN: [id number] DOB: 10/30/1940   Cancelled Treatment:    Reason Eval/Treat Not Completed: Patient not medically ready Pt on bedrest. Will await increase in activity orders prior to PT evaluation.   [name] 10/16/2016, 7:28 AM [name], PT, DPT [phone]

## 2016-10-16 NOTE — Progress Notes (Signed)
RT transported pt from MRI to 3M05 on ventilator. Pt stable throughout with no complications. RT will continue to monitor.

## 2016-10-16 NOTE — Progress Notes (Signed)
Referring Physician(s): Lindzen,E  Supervising Physician: Julieanne Cottoneveshwar, Sanjeev  [facility]:  [facility]  Chief Complaint: Left hemiparesis, right MCA embolus   Subjective: Intubated, sedated; daughter in room; left femoral art sheath removed this am; nurse reporting sl neurologic improvement on left side   Allergies: Banana; Chocolate; and Peanut-containing drug products  Medications: Prior to Admission medications   Medication Sig Start Date End Date Taking? Authorizing Provider  albuterol (PROVENTIL HFA;VENTOLIN HFA) 108 (90 BASE) MCG/ACT inhaler Inhale 2 puffs into the lungs every 6 (six) hours as needed for wheezing or shortness of breath. 11/11/14   Courtney ParisEden W Jones, MD  alendronate (FOSAMAX) 70 MG tablet Take 70 mg by mouth every Monday.  04/30/14   Historical Provider, MD  aspirin EC 81 MG tablet Take 81 mg by mouth daily.    Historical Provider, MD  atenolol (TENORMIN) 25 MG tablet Take 12.5 mg by mouth daily.  03/15/14   Historical Provider, MD  b complex vitamins tablet Take 1 tablet by mouth daily.    Historical Provider, MD  bacitracin ointment Apply 1 application topically 2 (two) times daily. Patient taking differently: Apply 1 application topically as needed for wound care.  05/14/14   Russella DarAllison L Ellis, NP  Calcium-Vitamin D (CALTRATE 600 PLUS-VIT D PO) Take 1 tablet by mouth 2 (two) times daily.    Historical Provider, MD  chlorpheniramine-HYDROcodone (TUSSIONEX PENNKINETIC ER) 10-8 MG/5ML LQCR Take 5 mLs by mouth every 12 (twelve) hours as needed for cough. 11/11/14   Courtney ParisEden W Jones, MD  docusate sodium 100 MG CAPS Take 100 mg by mouth 2 (two) times daily. Patient taking differently: Take 100 mg by mouth 2 (two) times daily as needed (constipation).  05/14/14   Russella DarAllison L Ellis, NP  fluticasone (FLONASE) 50 MCG/ACT nasal spray Place 1 spray into both nostrils daily as needed for allergies.  02/18/14   Historical Provider, MD  HYDROcodone-acetaminophen (NORCO/VICODIN) 5-325  MG per tablet Take 1 tablet by mouth every 6 (six) hours as needed for severe pain.    Historical Provider, MD  HYDROcodone-acetaminophen (NORCO/VICODIN) 5-325 MG per tablet Take 1 tablet by mouth every 6 (six) hours as needed for severe pain. 11/11/14   Carolan ClinesNora M Sadek, MD  Boris LownKrill Oil 1000 MG CAPS Take 1,000 mg by mouth 2 (two) times daily.     Historical Provider, MD  levofloxacin (LEVAQUIN) 750 MG tablet Take 1 tablet (750 mg total) by mouth daily. 11/11/14   Carolan ClinesNora M Sadek, MD  metFORMIN (GLUCOPHAGE-XR) 500 MG 24 hr tablet Take 500 mg by mouth 2 (two) times daily.  04/30/14   Historical Provider, MD  montelukast (SINGULAIR) 10 MG tablet Take 10 mg by mouth at bedtime.  08/15/14   Historical Provider, MD  Multiple Vitamin (DAILY MULTIVITAMIN PO) Take 1 tablet by mouth daily.    Historical Provider, MD  pravastatin (PRAVACHOL) 10 MG tablet Take 10 mg by mouth daily. 04/30/14   Historical Provider, MD  traMADol (ULTRAM) 50 MG tablet Take 1 tablet (50 mg total) by mouth 2 (two) times daily. Patient taking differently: Take 50 mg by mouth 2 (two) times daily as needed.  05/14/14   Russella DarAllison L Ellis, NP     Vital Signs: BP 119/78   Pulse 90   Temp 99.6 F (37.6 C) (Axillary)   Resp 14   Ht 4\' 10"  (1.473 m)   Wt 136 lb 0.4 oz (61.7 kg)   SpO2 99%   BMI 28.43 kg/m   Physical  Exam intubated/sedated; responds to pain; pupils equal/2 mm bilat; right sided motor strength 5/5, LUE 4/5/ LLE 3/5; both rt and left groin puncture sites ok, soft, no hematoma; feet warm  Imaging: Ct Head Wo Contrast  Result Date: 10/15/2016 CLINICAL DATA:  Followup intervention for right M1 occlusion EXAM: CT HEAD WITHOUT CONTRAST TECHNIQUE: Contiguous axial images were obtained from the base of the skull through the vertex without intravenous contrast. COMPARISON:  Multiple exams earlier same day. FINDINGS: Brain: Diffuse hyperdensity of the right caudate and putamen probably secondary to contrast staining. No density suggestive  of hemorrhage. Maintenance of gray-white differentiation in the peripheral right middle cerebral artery territory. No swelling or mass effect. No hydrocephalus or extra-axial collection. Vascular: No asymmetric vessel hyperdensity. Skull: Negative Sinuses/Orbits: Mucosal thickening throughout the paranasal sinuses as seen previously. Orbits negative. Other: None IMPRESSION: Contrast staining of the basal ganglia on the right, typically seen following intervention. This does not necessarily indicate infarction of these structures. More peripheral right MCA territory appears normal with gray-white differentiation preservation and no swelling or shift. Electronically Signed   By: Paulina FusiMark  Shogry M.D.   On: 10/15/2016 14:35   Ct Cerebral Perfusion W Contrast  Result Date: 10/15/2016 CLINICAL DATA:  Code stroke. 75 year old female with left side weakness and right gaze deviation onset 0930 hours. Initial encounter. EXAM: CT PERFUSION BRAIN TECHNIQUE: Multiphase CT imaging of the brain was performed following IV bolus contrast injection. Subsequent parametric perfusion maps were calculated using RAPID software. CONTRAST:  50 mL Isovue 370 COMPARISON:  Noncontrast head CT 952 hours today. FINDINGS: CT Brain Perfusion Findings: CBF (<30%) Volume: 17mL Perfusion (Tmax>6.0s) volume: 160mL Mismatch Volume: 143mL Infarction Location:Central white matter of the right MCA territory. Other findings: Perfusion source images are positive for distal right M1 or right bifurcation level occlusion. See series 21 images 183, 184, and 198. IMPRESSION: Positive for emergent large vessel occlusion at the distal right M1 or right MCA bifurcation. Core infarct volume estimated at 17 mL, with large area of right MCA territory penumbra (estimated at 160 mL). Unfortunately due to a scanner malfunction the planned CTA head and neck images were not acquired following this CT perfusion source images. However, I discussed the above findings by  telephone with Dr. Agnes LawrenceE. Lindzen on 10/15/2016 at 1027 hours, and we agree that the diagnostic information at hand is such that emergent Endovascular Neurointervention for reperfusion such proceed. Electronically Signed   By: Odessa FlemingH  Hall M.D.   On: 10/15/2016 10:39   Dg Chest Port 1 View  Result Date: 10/16/2016 CLINICAL DATA:  Ventilator EXAM: PORTABLE CHEST 1 VIEW COMPARISON:  10/15/2016 FINDINGS: Endotracheal tube is 3 cm above the carina. NG tube is in the stomach. Bilateral lower lobe airspace opacities, slightly increased. No visible effusions or acute bony abnormality. Heart is borderline in size. IMPRESSION: Increasing bibasilar atelectasis or infiltrates. Electronically Signed   By: Charlett NoseKevin  Dover M.D.   On: 10/16/2016 12:09   Dg Chest Port 1 View  Result Date: 10/15/2016 CLINICAL DATA:  Acute respiratory failure. EXAM: PORTABLE CHEST 1 VIEW COMPARISON:  Radiograph of November 09, 2014. FINDINGS: Stable cardiomediastinal silhouette. Atherosclerosis of thoracic aorta is noted. Distal tip of nasogastric tube is seen in proximal stomach. Endotracheal tube is seen projected over tracheal air shadow with distal tip 2 cm above the carina. No pneumothorax is noted. Mild bibasilar subsegmental atelectasis is noted. Bony thorax is unremarkable. IMPRESSION: Endotracheal and nasogastric tubes in grossly good position. Mild bibasilar subsegmental atelectasis. Aortic atherosclerosis. Electronically Signed  By: Lupita Raider, M.D.   On: 10/15/2016 19:22   Ct Head Code Stroke W/o Cm  Result Date: 10/15/2016 CLINICAL DATA:  Code stroke. 75 year old female with left side weakness and right gaze deviation onset 0930 hours. Initial encounter. EXAM: CT HEAD WITHOUT CONTRAST TECHNIQUE: Contiguous axial images were obtained from the base of the skull through the vertex without intravenous contrast. COMPARISON:  Head CT without contrast 05/13/2014. FINDINGS: Brain: No midline shift, ventriculomegaly, mass effect, evidence  of mass lesion, intracranial hemorrhage or evidence of cortically based acute infarction. Gray-white matter differentiation is within normal limits throughout the brain. Vascular: Positive for hyperdense right MCA bifurcation (series 204, image 22 and compare to the contralateral bifurcation on image 48). Calcified atherosclerosis at the skull base. Skull: No acute osseous abnormality identified. Sinuses/Orbits: Trace paranasal sinus mucosal thickening. Other: Rightward gaze deviation. Otherwise no acute orbit or scalp soft tissue findings. ASPECTS [facility] Stroke Program Early CT Score) - Ganglionic level infarction (caudate, lentiform nuclei, internal capsule, insula, M1-M3 cortex): 7 - Supraganglionic infarction (M4-M6 cortex): 3 Total score (0-10 with 10 being normal): 10 IMPRESSION: 1. Positive for hyperdense right MCA bifurcation highly suspicious for emergent large vessel occlusion in this clinical setting. No intracranial hemorrhage or changes of acute cortically based infarct 2. ASPECTS is 10. 3. The above was relayed via text pager to [facility] Dr. Agnes Lawrence on 10/15/2016 at 1005 hours. I also discussed the findings with him by telephone at 1016 hours. Electronically Signed   By: Odessa Fleming M.D.   On: 10/15/2016 10:30    Labs:  CBC:  Recent Labs  10/15/16 0946 10/15/16 0952 10/16/16 0356  WBC 6.5  --  8.4  HGB 14.7 15.0 11.1*  HCT 42.9 44.0 33.0*  PLT 321  --  289    COAGS:  Recent Labs  10/15/16 0946  INR 0.95  APTT 43*    BMP:  Recent Labs  10/15/16 0946 10/15/16 0952 10/16/16 0356  NA 143 142 139  K 3.9 3.8 3.5  CL 104 103 111  CO2 27  --  19*  GLUCOSE 142* 143* 125*  BUN 14 16 9   CALCIUM 10.4*  --  8.4*  CREATININE 0.68 0.70 0.58  GFRNONAA >60  --  >60  GFRAA >60  --  >60    LIVER FUNCTION TESTS:  Recent Labs  10/15/16 0946  BILITOT 0.6  AST 22  ALT 16  ALKPHOS 42  PROT 7.4  ALBUMIN 4.0    Assessment and Plan: Pt with hx left sided  weakness/facial droop, afib, right MCA embolus; s/p  RT common carotid arteriogram,followed by complete revascularization of  occluded RT MCA M1 segment with x1 pass with the solitaireFR 40 mm retrieval device and 1.8 mg of superselective intracranial IA Integrelin with achievement of a TICI 3 reperfusion 10/15/16; temp 99.6; hgb 11.1 (drop may be hemodilutional in nature but monitor closely); WBC 8.4; creat .58 ; f/u imaging pend; plans as per neuro/CCM   Electronically Signed: D. Jeananne Rama 10/16/2016, 12:48 PM   I spent a total of 20 minutes at the the patient's bedside AND on the patient's hospital floor or unit, greater than 50% of which was counseling/coordinating care for cerebral arteriogram with endovascular intervention    Patient ID: [name], female   DOB: November 23, 1940, 75 y.o.   MRN: [id number]

## 2016-10-16 NOTE — CV Procedure (Signed)
2D echo attempted but going to MRI

## 2016-10-16 NOTE — Progress Notes (Signed)
PULMONARY / CRITICAL CARE MEDICINE   Name: [name] MRN: [id number] DOB: 08/31/1941    ADMISSION DATE:  10/15/2016 CONSULTATION DATE:  12/30  REFERRING MD:  Otelia LimesLindzen   CHIEF COMPLAINT:  Ventilator management and critical care services   HISTORY OF PRESENT ILLNESS:   75 year old female w/ sig h/o Afib, type II DM. Was not on anticoagulation d/t h/o falls and SDH. Was in usual state of health until 12/30 am. Per family she was feeling "tired". Went to bed at 0900. At 0930 found by family w/ left sided weakness and facial droop. [facility] Code stroke initiated. CT head c/w: hyperdense right MCA bifurcation highly suspicious for emergent large vessel occlusion. Was intubated. Transferred to neuro-radiology where she underwent: cerebral angiogram & clot retrieval and revascularization. She returned to the ICU on ventilatory support. PCCM asked to assist w/ care   SUBJECTIVE:  Has been interactive today, better strength on the L Planning for MRI brain today Sheaths are out  VITAL SIGNS: BP 132/74   Pulse 83   Temp 99.6 F (37.6 C) (Axillary)   Resp 14   Ht 4\' 10"  (1.473 m)   Wt 61.7 kg (136 lb 0.4 oz)   SpO2 100%   BMI 28.43 kg/m   HEMODYNAMICS:    VENTILATOR SETTINGS: Vent Mode: PRVC FiO2 (%):  [40 %] 40 % Set Rate:  [14 bmp] 14 bmp Vt Set:  [500 mL] 500 mL PEEP:  [5 cmH20] 5 cmH20 Plateau Pressure:  [18 cmH20-21 cmH20] 20 cmH20  INTAKE / OUTPUT: I/O last 3 completed shifts: In: 2382.8 [I.V.:2312.8; NG/GT:70] Out: 1395 [Urine:1375; Blood:20]   PHYSICAL EXAMINATION: General:  Well nourished female, sedated on vent  Neuro:  Opens eyes to verbal request. Weak on the L but better than 12/30. Follows commands.  HEENT:  Orally intubated. No JVD Cardiovascular:  Regular irreg II/VI SEM  Lungs:  Clear w/ equal chest rise  Abdomen:  Soft, not tender + bowel sounds  Musculoskeletal:  No deformities Skin:  Warm and dry   LABS:  BMET  Recent Labs Lab  10/15/16 0946 10/15/16 0952 10/16/16 0356  NA 143 142 139  K 3.9 3.8 3.5  CL 104 103 111  CO2 27  --  19*  BUN 14 16 9   CREATININE 0.68 0.70 0.58  GLUCOSE 142* 143* 125*    Electrolytes  Recent Labs Lab 10/15/16 0946 10/16/16 0356  CALCIUM 10.4* 8.4*    CBC  Recent Labs Lab 10/15/16 0946 10/15/16 0952 10/16/16 0356 10/16/16 1321  WBC 6.5  --  8.4 10.0  HGB 14.7 15.0 11.1* 11.9*  HCT 42.9 44.0 33.0* 36.0  PLT 321  --  289 272    Coag's  Recent Labs Lab 10/15/16 0946  APTT 43*  INR 0.95    Sepsis Markers No results for input(s): LATICACIDVEN, PROCALCITON, O2SATVEN in the last 168 hours.  ABG  Recent Labs Lab 10/15/16 1618  PHART 7.434  PCO2ART 35.1  PO2ART 339.0*    Liver Enzymes  Recent Labs Lab 10/15/16 0946  AST 22  ALT 16  ALKPHOS 42  BILITOT 0.6  ALBUMIN 4.0    Cardiac Enzymes No results for input(s): TROPONINI, PROBNP in the last 168 hours.  Glucose  Recent Labs Lab 10/15/16 0945 10/15/16 2028  GLUCAP 134* 122*    Imaging Dg Chest Port 1 View  Result Date: 10/16/2016 CLINICAL DATA:  Ventilator EXAM: PORTABLE CHEST 1 VIEW COMPARISON:  10/15/2016 FINDINGS: Endotracheal tube is 3  cm above the carina. NG tube is in the stomach. Bilateral lower lobe airspace opacities, slightly increased. No visible effusions or acute bony abnormality. Heart is borderline in size. IMPRESSION: Increasing bibasilar atelectasis or infiltrates. Electronically Signed   By: Charlett NoseKevin  Dover M.D.   On: 10/16/2016 12:09   Dg Chest Port 1 View  Result Date: 10/15/2016 CLINICAL DATA:  Acute respiratory failure. EXAM: PORTABLE CHEST 1 VIEW COMPARISON:  Radiograph of November 09, 2014. FINDINGS: Stable cardiomediastinal silhouette. Atherosclerosis of thoracic aorta is noted. Distal tip of nasogastric tube is seen in proximal stomach. Endotracheal tube is seen projected over tracheal air shadow with distal tip 2 cm above the carina. No pneumothorax is noted.  Mild bibasilar subsegmental atelectasis is noted. Bony thorax is unremarkable. IMPRESSION: Endotracheal and nasogastric tubes in grossly good position. Mild bibasilar subsegmental atelectasis. Aortic atherosclerosis. Electronically Signed   By: Lupita RaiderJames  Green Jr, M.D.   On: 10/15/2016 19:22     STUDIES:  CT head 12/30: 1. Positive for hyperdense right MCA bifurcation highly suspicious for emergent large vessel occlusion in this clinical setting. No intracranial hemorrhage or changes of acute cortically based infarct CT head w/ perfusion 12/30: Positive for emergent large vessel occlusion at the distal right M1 or right MCA bifurcation.Core infarct volume estimated at 17 mL, with large area of right MCA territory penumbra (estimated at 160 mL).  CULTURES:  ANTIBIOTICS:   SIGNIFICANT EVENTS: 12/30 admitted to ED. Large right MCA CVA. To IR for revascularization   LINES/TUBES: ETT 12/30 >>   DISCUSSION: 75 year old female admitted 12/30 w/ large right MCA CVA w/ associated left sided weakness. Went to IR where she underwent successful clot retrieval and re-vascularization. Returned to ICU in vent.  -MRI this pm -ensure adequate Ve and gas exchange -f/u CXR -SBP goal 120-140 -assess for extubation after f/u neuro-imaging.   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  P:  Goal SBP 120-140 Cont tele   RENAL A:   Relative oliguria  P:   Strict I&O Avoid hypotension  F/u chemistry  GASTROINTESTINAL A:   NPO status  At risk for dysphagia  P:   NPO for now H2B for  SUP Start tube feeds if not extubated 1/1 Will need swallow eval after ETT removed    HEMATOLOGIC A:   No acute  P:  Trend CBC  Pas to LEs   INFECTIOUS A:   No acute  P:   Trend fever and WBC curve   ENDOCRINE A:   Mild hyperglycemia  P:   ssi      FAMILY  - Updates: updated family at bedside 12/31  - Inter-disciplinary family meet or Palliative Care meeting due by:  1/5  Independent CC time 34 minutes  Levy Pupaobert Peityn Payton, MD, PhD 10/16/2016, 3:18 PM [facility] [phone] or if no answer [phone]

## 2016-10-16 NOTE — Progress Notes (Addendum)
STROKE TEAM PROGRESS NOTE   HISTORY OF PRESENT ILLNESS (per record) [name] is an 75 y.o. female who presents with acute onset of left facial droop and left hemiplegia. She had symptoms of fatigue this morning and then went to rest on her bed at 0900. At 0930 her family noted left arm/leg weakness and facial droop. EMS was called and she was transported to [facility] ED as a Code Stroke. EMS also noted rightward gaze deviation.   She has a history of atrial fibrillation and was on a blood thinner, but this was discontinued after she fell and sustained a subdural bleed. On ASA at home. Does not speak [place]. She is ethnic [place] and emigrated from [place]. She speaks both [place]inese and [place].  LSN: 8:30 AM tPA Given: No: Due to prior history of subdural hemorrhage NIHSS: 18   Cerebral Angiogram and Intervention - Dr. Corliss Skainseveshwar S/P RT common carotid arteriogram,followed by complete revascularization of  occluded RT MCA M1 segment with x1 pass with the solitaireFR 4mmx 40 mm retrieval device and 1.8 mg of superselective intracranial IA Integrelin with achievement of a TICI 3 reperfusion   SUBJECTIVE (INTERVAL HISTORY) Her daughter is at the bedside.  Overall patient is unable to assess her condition as gradually improving. The patient is currently intubated.  RN at the bedside as well and reports no events overnight and some improvement in patient's neurologic function.  Daughter ex[plained the conversation she had with PCP regarding fall risk and the prior decision to not anticoagulate based on the risk:benefit ratio   OBJECTIVE Temp:  [97.3 F (36.3 C)-99.7 F (37.6 C)] 98.4 F (36.9 C) (12/31 0800) Pulse Rate:  [80-114] 84 (12/31 0821) Cardiac Rhythm: Atrial fibrillation (12/31 0800) Resp:  [14-27] 14 (12/31 0821) BP: (86-150)/(38-91) 108/70 (12/31 0821) SpO2:  [90 %-100 %] 100 % (12/31 0821) Arterial Line BP: (103-177)/(56-92) 142/67 (12/31 0800) FiO2 (%):  [40 %-100 %] 40 % (12/31  0821) Weight:  [60.5 kg (133 lb 6.1 oz)-61.7 kg (136 lb 0.4 oz)] 61.7 kg (136 lb 0.4 oz) (12/30 1400)  CBC:   Recent Labs Lab 10/15/16 0946 10/15/16 0952 10/16/16 0356  WBC 6.5  --  8.4  NEUTROABS 4.1  --  6.2  HGB 14.7 15.0 11.1*  HCT 42.9 44.0 33.0*  MCV 92.9  --  91.7  PLT 321  --  289    Basic Metabolic Panel:   Recent Labs Lab 10/15/16 0946 10/15/16 0952 10/16/16 0356  NA 143 142 139  K 3.9 3.8 3.5  CL 104 103 111  CO2 27  --  19*  GLUCOSE 142* 143* 125*  BUN 14 16 9   CREATININE 0.68 0.70 0.58  CALCIUM 10.4*  --  8.4*    Lipid Panel:     Component Value Date/Time   CHOL 128 10/16/2016 0356   TRIG 198 (H) 10/16/2016 0356   HDL 29 (L) 10/16/2016 0356   CHOLHDL 4.4 10/16/2016 0356   VLDL 40 10/16/2016 0356   LDLCALC 59 10/16/2016 0356   HgbA1c:  Lab Results  Component Value Date   HGBA1C 6.3 (H) 11/09/2014   Urine Drug Screen: No results found for: LABOPIA, COCAINSCRNUR, LABBENZ, AMPHETMU, THCU, LABBARB    IMAGING  Ct Head Wo Contrast 10/15/2016 Contrast staining of the basal ganglia on the right, typically seen following intervention. This does not necessarily indicate infarction of these structures. More peripheral right MCA territory appears normal with gray-white differentiation preservation and no swelling or shift.    Ct  Cerebral Perfusion W Contrast 10/15/2016 Positive for emergent large vessel occlusion at the distal right M1 or right MCA bifurcation. Core infarct volume estimated at 17 mL, with large area of right MCA territory penumbra (estimated at 160 mL). Unfortunately due to a scanner malfunction the planned CTA head and neck images were not acquired following this CT perfusion source images.     Dg Chest Port 1 View 10/15/2016 Endotracheal and nasogastric tubes in grossly good position. Mild bibasilar subsegmental atelectasis. Aortic atherosclerosis.   Ct Head Code Stroke W/o Cm 10/15/2016 1. Positive for hyperdense right MCA  bifurcation highly suspicious for emergent large vessel occlusion in this clinical setting. No intracranial hemorrhage or changes of acute cortically based infarct  2. ASPECTS is 10.    Cerebral Angiogram and Intervention - Dr. Corliss Skainseveshwar S/P RT common carotid arteriogram,followed by complete revascularization of occluded RT MCA M1 segment with x1 pass with the solitaireFR 4mmx 40 mm retrieval device and 1.8 mg of superselective intracranial IA Integrelin with achievement of a TICI 3 reperfusion    PHYSICAL EXAM HEENT: [place]/AT; pupils small and reactive Cardiac:  afib Lungs: CTA Abdomen:  Soft NT, ND Ext: Warm and well-perfused.  Right groin sight soft and NT  Neurologic Examination: Ment: Able to nod her head and follow commands with her daughter's daughters assistance in [place].  Able to follow commands. Awakens easily. CN: PERRL. Able to detect moving fingers in right and left visual fields simultaneously. Eye deviation to right with inability to cross midline to the left; this cannot be overcome with doll's eye maneuver. Left facial droop. Hearing grossly intact  Motor: 5/5 RUE and RLE. 3/5 LUE and LLE.  Sensory: Appears to sense noxious stimuli less on the left Cerebellar: not tested.  Gait: Unable to assess.   ASSESSMENT/PLAN [name] is a 75 y.o. female with history of diabetes mellitus, subdural hematoma in 2015, previous pneumonia, obstructive sleep apnea on C Pap, dyslipidemia, and atrial fibrillation not anticoagulated secondary to subdural hematoma history presenting with left facial droop, left hemiplegia, and right gaze deviation.  She did not receive IV t-PA due to prior history of subdural hematoma. The patient does not speak [place]. She speaks [place]inese and [place].  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  Resultant  Left hemiparesis  MRI - pending  MRA - pending  Carotid Doppler - cerebral  angiogram  2D Echo - pending  LDL - 59  HgbA1c pending  VTE prophylaxis - SCDs Diet NPO time specified  aspirin 81 mg daily prior to admission, now on No antithrombotic s/p intervention - integrelin  Ongoing aggressive stroke risk factor management  Therapy recommendations: pending  Disposition: Pending  Hypertension  Blood pressure running low -> IV fluid hydration - Phenylephrine  SBP goal 100 - 180  Long-term BP goal normotensive  Hyperlipidemia  Home meds:  Pravachol 10 mg daily not resumed in hospital  LDL 59, goal < 70  Resume Pravachol when PO access is available.  Continue statin at discharge  Diabetes  HgbA1c pending, goal < 7.0  Unc / Controlled pending results  Cerebral Angiogram and Intervention - Dr. Corliss Skainseveshwar S/P RT common carotid arteriogram,followed by complete revascularization of  occluded RT MCA M1 segment with x1 pass with the solitaireFR 4mmx 40 mm retrieval device and 1.8 mg of superselective intracranial IA Integrelin with achievement of a TICI 3 reperfusion  Other Stroke Risk Factors  Advanced age  Overweight, Body mass index is 28.43 kg/m.,  recommend weight loss, diet and exercise as appropriate   Obstructive sleep apnea, on CPAP at home  Atrial Fibrillation   Other Active Problems  The patient does not speak English. She speaks [place] and [place]  History of subdural hematoma  Mild anemia post procedure  Hospital day # 1  CRITICAL CARE NEUROLOGY ATTENDING NOTE This patient is critically ill and at significant risk of neurological worsening, death and care requires constant monitoring of vital signs, hemodynamics,respiratory and cardiac monitoring, extensive review of multiple databases, frequent neurological assessment, discussion with family, other specialists and medical decision making of high complexity. This critical care time does not reflect procedure time, or teaching time or supervisory time of PA/NP/Med  Resident etc. but could involve care discussion time. Patient was seen and examined by me personally.   I reviewed notes, independently viewed imaging studies. The laboratory and radiographic studies were personally reviewed by me.  ROS: pertinent positives could not be fully documented due to intubation and preferred use of [place] language  Assessment and plan completed by me personally:   24 hour CT scan pending for the morning  MRI pending  MRA of head and neck pending  ECHO pending  Rechecking H/H today given large drop; if consistent with AM lab with perform CT of pelvis to rule out retroperitoneal bleed  Condition: is mproved   I spent 40 minutes of Neurocritical Care time in the care of  this patient.  SIGNED BY: Dr. Sula Soda    To contact Stroke Continuity provider, please refer to [email or web address]. After hours, contact General Neurology

## 2016-10-16 NOTE — Progress Notes (Signed)
Dr. Otelia LimesLindzen paged regarding completion of MRI and aspirin order.

## 2016-10-17 ENCOUNTER — Inpatient Hospital Stay (HOSPITAL_COMMUNITY): Payer: Medicare Other

## 2016-10-17 DIAGNOSIS — J9600 Acute respiratory failure, unspecified whether with hypoxia or hypercapnia: Secondary | ICD-10-CM

## 2016-10-17 LAB — BASIC METABOLIC PANEL
ANION GAP: 6 (ref 5–15)
BUN: 6 mg/dL (ref 6–20)
CALCIUM: 8.6 mg/dL — AB (ref 8.9–10.3)
CO2: 25 mmol/L (ref 22–32)
Chloride: 111 mmol/L (ref 101–111)
Creatinine, Ser: 0.6 mg/dL (ref 0.44–1.00)
GLUCOSE: 128 mg/dL — AB (ref 65–99)
POTASSIUM: 3.5 mmol/L (ref 3.5–5.1)
SODIUM: 142 mmol/L (ref 135–145)

## 2016-10-17 LAB — CBC
HCT: 35 % — ABNORMAL LOW (ref 36.0–46.0)
Hemoglobin: 11.8 g/dL — ABNORMAL LOW (ref 12.0–15.0)
MCH: 31.5 pg (ref 26.0–34.0)
MCHC: 33.7 g/dL (ref 30.0–36.0)
MCV: 93.3 fL (ref 78.0–100.0)
PLATELETS: 307 10*3/uL (ref 150–400)
RBC: 3.75 MIL/uL — AB (ref 3.87–5.11)
RDW: 12.3 % (ref 11.5–15.5)
WBC: 10 10*3/uL (ref 4.0–10.5)

## 2016-10-17 LAB — ECHOCARDIOGRAM COMPLETE
Height: 58 in
Weight: 2176.38 oz

## 2016-10-17 LAB — GLUCOSE, CAPILLARY: GLUCOSE-CAPILLARY: 97 mg/dL (ref 65–99)

## 2016-10-17 MED ORDER — CHLORHEXIDINE GLUCONATE 0.12 % MT SOLN
15.0000 mL | Freq: Two times a day (BID) | OROMUCOSAL | Status: DC
  Administered 2016-10-17 – 2016-10-19 (×4): 15 mL via OROMUCOSAL
  Filled 2016-10-17 (×3): qty 15

## 2016-10-17 MED ORDER — INSULIN ASPART 100 UNIT/ML ~~LOC~~ SOLN
0.0000 [IU] | Freq: Three times a day (TID) | SUBCUTANEOUS | Status: DC
  Administered 2016-10-19 (×2): 1 [IU] via SUBCUTANEOUS

## 2016-10-17 MED ORDER — ENOXAPARIN SODIUM 30 MG/0.3ML ~~LOC~~ SOLN
30.0000 mg | SUBCUTANEOUS | Status: DC
  Administered 2016-10-17 – 2016-10-18 (×2): 30 mg via SUBCUTANEOUS
  Filled 2016-10-17 (×2): qty 0.3

## 2016-10-17 MED ORDER — ORAL CARE MOUTH RINSE
15.0000 mL | Freq: Two times a day (BID) | OROMUCOSAL | Status: DC
  Administered 2016-10-17 – 2016-10-19 (×4): 15 mL via OROMUCOSAL

## 2016-10-17 MED ORDER — ACETAMINOPHEN 650 MG RE SUPP
650.0000 mg | Freq: Four times a day (QID) | RECTAL | Status: DC | PRN
  Administered 2016-10-17 – 2016-10-18 (×3): 650 mg via RECTAL
  Filled 2016-10-17 (×3): qty 1

## 2016-10-17 NOTE — Evaluation (Signed)
Occupational Therapy Evaluation Patient Details Name: [name] MRN: [id number] DOB: 09/07/1941 Today's Date: 10/17/2016    History of Present Illness 76 year old female w/ sig h/o Afib, type II DM. Was not on anticoagulation d/t h/o falls and SDH. Was in usual state of health until 12/30 am. Per family she was feeling "tired". Went to bed at 0900. At 0930 found by family w/ left sided weakness and facial droop. [facility]. Code stroke initiated. CT head c/w: hyperdense right MCA bifurcation highly suspicious   Clinical Impression   Per daughter, pt was independent with ADL PTA. Currently pt min assist +2 for functional mobility and mod assist overall for ADL. Pt presenting with LUE impaired strength/sensation/coordination, poor standing balance impacting her independence and safety with ADL and functional mobility. Recommending CIR level therapies to maximize independence and safety with ADL and functional mobility prior to return home. Pt would benefit from continued skilled OT to address established goals.    Follow Up Recommendations  CIR;Supervision/Assistance - 24 hour    Equipment Recommendations  Other (comment) (TBD at next venue)    Recommendations for Other Services Rehab consult     Precautions / Restrictions Precautions Precautions: Fall Restrictions Weight Bearing Restrictions: No      Mobility Bed Mobility Overal bed mobility: Needs Assistance Bed Mobility: Supine to Sit     Supine to sit: +2 for safety/equipment;HOB elevated;Min assist     General bed mobility comments: Assist for moving LEs off bed and for elevation of trunk.  Transfers Overall transfer level: Needs assistance Equipment used: 2 person hand held assist Transfers: Sit to/from Stand Sit to Stand: Min assist;+2 safety/equipment         General transfer comment: Pt needed steadying assist of 2 persons HHA.  Slight posterior lean as well as flexed posture therefore unsteady on  feet.     Balance Overall balance assessment: Needs assistance;History of Falls Sitting-balance support: No upper extremity supported;Feet supported Sitting balance-Leahy Scale: Fair     Standing balance support: Bilateral upper extremity supported;During functional activity Standing balance-Leahy Scale: Poor Standing balance comment: relies on at least 1 UE support for balance.                             ADL Overall ADL's : Needs assistance/impaired Eating/Feeding: Minimal assistance;Sitting   Grooming: Moderate assistance;Sitting   Upper Body Bathing: Moderate assistance;Sitting   Lower Body Bathing: Moderate assistance;Sit to/from stand   Upper Body Dressing : Minimal assistance;Sitting   Lower Body Dressing: Moderate assistance;Sit to/from stand Lower Body Dressing Details (indicate cue type and reason): Pt able to don socks sitting EOB with min assist Toilet Transfer: Minimal assistance;+2 for physical assistance;Ambulation;Regular Toilet;Grab bars   Toileting- Clothing Manipulation and Hygiene: Min guard;Sitting/lateral lean Toileting - Clothing Manipulation Details (indicate cue type and reason): for peri care only     Functional mobility during ADLs: Minimal assistance;+2 for physical assistance General ADL Comments: Educated pt and daughter on functional use of LUE and continued ROM throughout the day.     Vision Additional Comments: Appears WFL; needs further assessment   Perception     Praxis      Pertinent Vitals/Pain Pain Assessment: No/denies pain     Hand Dominance Right   Extremity/Trunk Assessment Upper Extremity Assessment Upper Extremity Assessment: LUE deficits/detail LUE Deficits / Details: grossly 4-/5. impaired coordiantion and sensation LUE Sensation: decreased light touch LUE Coordination: decreased fine motor;decreased  gross motor      Cervical / Trunk Assessment Cervical / Trunk Assessment: Kyphotic   Communication  Communication Communication: Prefers language other than English;Interpreter utilized (daughter interpreted)   Cognition Arousal/Alertness: Awake/alert Behavior During Therapy: Flat affect Overall Cognitive Status: Impaired/Different from baseline Area of Impairment: Following commands;Safety/judgement;Awareness;Problem solving       Following Commands: Follows one step commands with increased time Safety/Judgement: Decreased awareness of safety;Decreased awareness of deficits   Problem Solving: Difficulty sequencing;Requires verbal cues;Requires tactile cues General Comments: Question whether pt has left inattention to task. Difficult to assess cognition fully due to language barrier as well.    General Comments       Exercises       Shoulder Instructions      Home Living Family/patient expects to be discharged to:: Private residence Living Arrangements: Children Available Help at Discharge: Family;Available PRN/intermittently (lives with daughter who works, sister can come by) Type of Home: House Home Access: Stairs to enter Secretary/administrator of Steps: 3 Entrance Stairs-Rails: Right Home Layout: Two level;Able to live on main level with bedroom/bathroom Alternate Level Stairs-Number of Steps: 12 Alternate Level Stairs-Rails: Right Bathroom Shower/Tub: Chief Strategy Officer: Standard Bathroom Accessibility: Yes   Home Equipment: Walker - standard;Cane - single point          Prior Functioning/Environment Level of Independence: Independent with assistive device(s);Needs assistance  Gait / Transfers Assistance Needed: PTA, used cane intermittently per daughter ADL's / Homemaking Assistance Needed: I with ADLs            OT Problem List: Decreased strength;Decreased range of motion;Decreased activity tolerance;Impaired balance (sitting and/or standing);Decreased coordination;Decreased knowledge of use of DME or AE;Decreased knowledge of  precautions;Impaired sensation;Impaired UE functional use   OT Treatment/Interventions: Self-care/ADL training;Therapeutic exercise;Neuromuscular education;Energy conservation;DME and/or AE instruction;Therapeutic activities;Patient/family education;Balance training    OT Goals(Current goals can be found in the care plan section) Acute Rehab OT Goals Patient Stated Goal: get back to being independent OT Goal Formulation: With patient/family Time For Goal Achievement: 10/31/16 Potential to Achieve Goals: Good ADL Goals Pt Will Perform Grooming: with supervision;standing Pt Will Perform Upper Body Bathing: with supervision;sitting Pt Will Perform Lower Body Bathing: with supervision;sit to/from stand Pt Will Transfer to Toilet: with supervision;ambulating;regular height toilet Pt Will Perform Toileting - Clothing Manipulation and hygiene: with supervision;sit to/from stand  OT Frequency: Min 2X/week   Barriers to D/C:            Co-evaluation PT/OT/SLP Co-Evaluation/Treatment: Yes Reason for Co-Treatment: Complexity of the patient's impairments (multi-system involvement) PT goals addressed during session: Mobility/safety with mobility OT goals addressed during session: ADL's and self-care      End of Session Equipment Utilized During Treatment: Gait belt Nurse Communication: Mobility status  Activity Tolerance: Patient tolerated treatment well Patient left: in chair;with call bell/phone within reach;with chair alarm set;with family/visitor present   Time: 1425-1454 OT Time Calculation (min): 29 min Charges:  OT General Charges $OT Visit: 1 Procedure OT Evaluation $OT Eval Moderate Complexity: 1 Procedure G-Codes:     Gaye Alken M.S., OTR/L Pager: [phone]  10/17/2016, 4:35 PM

## 2016-10-17 NOTE — Evaluation (Signed)
Clinical/Bedside Swallow Evaluation Patient Details  Name: [name] MRN: [id number] Date of Birth: 1941-07-28  Today's Date: 10/17/2016 Time: SLP Start Time (ACUTE ONLY): 1454 SLP Stop Time (ACUTE ONLY): 1510 SLP Time Calculation (min) (ACUTE ONLY): 16 min  Past Medical History:  Past Medical History:  Diagnosis Date  . Atrial fibrillation (HCC)   . Chronic back pain    "mid back down into lower back" (08/25/2014)  . GERD (gastroesophageal reflux disease)   . Hypertriglyceridemia   . OSA on CPAP   . Osteoarthritis    "knees, hands, back" (08/25/2014)  . Pneumonia ~ 2000 X 1  . Subdural hematoma (HCC) july 2015   S/P fall while on Coumadin  . T12 compression fracture (HCC) 2012  . Type II diabetes mellitus (HCC)    Past Surgical History:  Past Surgical History:  Procedure Laterality Date  . APPENDECTOMY  2012  . CATARACT EXTRACTION W/ INTRAOCULAR LENS  IMPLANT, BILATERAL Bilateral 2000's  . RADIOLOGY WITH ANESTHESIA N/A 10/15/2016   Procedure: RADIOLOGY WITH ANESTHESIA;  Surgeon: [name], MD;  Location: [facility] OR;  Service: Radiology;  Laterality: N/A;  . TOTAL ABDOMINAL HYSTERECTOMY  1990   HPI:      Assessment / Plan / Recommendation Clinical Impression  Patient presents with evidence of an acute reversible dysphagia s/p 2 day intubation characterized by hoarse vocal quality/decreased glottal closure and s/s of aspiration with thin liquids. Patient with known h/o what appears to be an esophageal dysphagia based on notes from FEES complete at [facility]. Recommend instrumental testing to determine least restrictive diet given history and current presentation.     Aspiration Risk  Moderate aspiration risk    Diet Recommendation NPO   Medication Administration: Via alternative means    Other  Recommendations Oral Care Recommendations: Oral care QID   Follow up Recommendations Inpatient Rehab             Prognosis Prognosis for Safe Diet Advancement: Good       Swallow Study   General Type of Study: Bedside Swallow Evaluation Previous Swallow Assessment: FEES at [facility] 02/27/14-suspect CP dysfunction Diet Prior to this Study: NPO Temperature Spikes Noted: No Respiratory Status: Room air History of Recent Intubation: Yes Length of Intubations (days): 2 days Date extubated: 10/17/16 Behavior/Cognition: Alert;Cooperative;Pleasant mood Oral Cavity Assessment: Within Functional Limits Oral Care Completed by SLP: Recent completion by staff Oral Cavity - Dentition: Adequate natural dentition Vision: Functional for self-feeding Self-Feeding Abilities: Able to feed self Patient Positioning: Upright in chair Baseline Vocal Quality: Hoarse Volitional Cough: Strong Volitional Swallow: Able to elicit    Oral/Motor/Sensory Function Overall Oral Motor/Sensory Function: Mild impairment Facial ROM: Reduced left;Suspected CN VII (facial) dysfunction Facial Symmetry: Abnormal symmetry left;Suspected CN VII (facial) dysfunction Facial Strength: Reduced left;Suspected CN VII (facial) dysfunction Facial Sensation: Reduced left;Suspected CN V (Trigeminal) dysfunction Lingual ROM: Reduced left;Suspected CN XII (hypoglossal) dysfunction Lingual Symmetry: Abnormal symmetry left;Suspected CN XII (hypoglossal) dysfunction Lingual Strength: Suspected CN XII (hypoglossal) dysfunction Lingual Sensation: Suspected CN VII (facial) dysfunction-anterior 2/3 tongue Velum: Within Functional Limits Mandible: Within Functional Limits   Ice Chips Ice chips: Impaired Presentation: Cup;Spoon Pharyngeal Phase Impairments: Wet Vocal Quality   Thin Liquid Thin Liquid: Impaired Presentation: Cup;Self Fed Oral Phase Functional Implications: Left anterior spillage Pharyngeal  Phase Impairments: Multiple swallows;Wet Vocal Quality;Cough - Immediate    Nectar Thick Nectar Thick Liquid: Not tested   Honey Thick Honey Thick Liquid: Not tested   Puree Puree: Not tested    Solid  GO   Solid: Not tested       Ferdinand LangoLeah Gearald Stonebraker MA, CCC-SLP [phone]  Elery Cadenhead Meryl 10/17/2016,3:19 PM

## 2016-10-17 NOTE — Evaluation (Signed)
Physical Therapy Evaluation Patient Details Name: [name] MRN: [id number] DOB: 09/23/1941 Today's Date: 10/17/2016   History of Present Illness  year old female w/ sig h/o Afib, type II DM. Was not on anticoagulation d/t h/o falls and SDH. Was in usual state of health until 12/30 am. Per family she was feeling "tired". Went to bed at 0900. At 0930 found by family w/ left sided weakness and facial droop. [facility]. Code stroke initiated. CT head c/w: hyperdense right MCA bifurcation highly suspicious  Clinical Impression  Pt admitted with above diagnosis. Pt currently with functional limitations due to the deficits listed below (see PT Problem List). Pt was able to ambulate with +2 min assist.  Feel that pt is an excellent rehab candidate.  Will ask for referral.  Continue toward goals.  Pt will benefit from skilled PT to increase their independence and safety with mobility to allow discharge to the venue listed below.      Follow Up Recommendations CIR;Supervision - Intermittent    Equipment Recommendations  Other (comment) (TBA)    Recommendations for Other Services Rehab consult     Precautions / Restrictions Precautions Precautions: Fall Restrictions Weight Bearing Restrictions: No      Mobility  Bed Mobility Overal bed mobility: Needs Assistance Bed Mobility: Supine to Sit     Supine to sit: +2 for safety/equipment;HOB elevated;Min assist     General bed mobility comments: Assist for moving LEs off bed and for elevation of trunk.  Transfers Overall transfer level: Needs assistance Equipment used: 2 person hand held assist Transfers: Sit to/from Stand Sit to Stand: Min assist;+2 safety/equipment         General transfer comment: Pt needed steadying assist of 2 persons HHA.  Slight posterior lean as well as flexed posture therefore unsteady on feet.   Ambulation/Gait Ambulation/Gait assistance: Min assist;+2 safety/equipment Ambulation Distance (Feet):  47 Feet (12 feet and then 35 feet) Assistive device: 2 person hand held assist Gait Pattern/deviations: Step-to pattern;Decreased step length - right;Decreased step length - left;Decreased stride length;Shuffle;Drifts right/left;Trunk flexed;Wide base of support   Gait velocity interpretation: Below normal speed for age/gender General Gait Details: Pt was able to ambulate with intiailly 2 person assist and progressing to 1 person assist.  Pt with wide BOS with short steps but did lengthen steps with commands.  Pt with flexed posture as well.  Pt needed steading assist but for the most part was min assist with 2ned person for lines and safety.   Stairs            Wheelchair Mobility    Modified Rankin (Stroke Patients Only) Modified Rankin (Stroke Patients Only) Pre-Morbid Rankin Score: No significant disability Modified Rankin: Moderately severe disability     Balance Overall balance assessment: Needs assistance;History of Falls Sitting-balance support: No upper extremity supported;Feet supported Sitting balance-Leahy Scale: Fair     Standing balance support: Bilateral upper extremity supported;During functional activity Standing balance-Leahy Scale: Poor Standing balance comment: relies on at least 1 UE support for balance.                              Pertinent Vitals/Pain Pain Assessment: No/denies pain  O2 sats 100% on arrival with 2LO2.  On RA during treatment, O2 sats >90% throughout.  Asked Nursing if O2 could be removed at end of treatment and nurse agreed.  HR 98-114 bpm, 122/77 initial BP; 118/76 post ambulation BP.  Home Living Family/patient expects to be discharged to:: Private residence Living Arrangements: Children Available Help at Discharge: Family;Available PRN/intermittently (lives with daughter who works, sister can come by) Type of Home: House Home Access: Stairs to enter Entrance Stairs-Rails: Right Entrance Stairs-Number of Steps:  3 Home Layout: Two level;Able to live on main level with bedroom/bathroom Home Equipment: Walker - standard;Cane - single point      Prior Function Level of Independence: Independent with assistive device(s);Needs assistance   Gait / Transfers Assistance Needed: PTA, used cane intermittently per daughter  ADL's / Homemaking Assistance Needed: I with ADLs        Hand Dominance   Dominant Hand: Right    Extremity/Trunk Assessment   Upper Extremity Assessment Upper Extremity Assessment: Defer to OT evaluation    Lower Extremity Assessment Lower Extremity Assessment: LLE deficits/detail LLE Deficits / Details: grossly 3-5 LLE Sensation: decreased light touch    Cervical / Trunk Assessment Cervical / Trunk Assessment: Kyphotic  Communication   Communication: Prefers language other than [place];Interpreter utilized (daughter interpreted)  Cognition Arousal/Alertness: Awake/alert Behavior During Therapy: Flat affect Overall Cognitive Status: Impaired/Different from baseline Area of Impairment: Following commands;Safety/judgement;Awareness;Problem solving       Following Commands: Follows one step commands with increased time Safety/Judgement: Decreased awareness of safety;Decreased awareness of deficits   Problem Solving: Difficulty sequencing;Requires verbal cues;Requires tactile cues General Comments: Question whether pt has left inattention to task. Difficult to assess cognition fully due to language barrier as well.     General Comments      Exercises     Assessment/Plan    PT Assessment Patient needs continued PT services  PT Problem List Decreased activity tolerance;Decreased balance;Decreased mobility;Decreased knowledge of use of DME;Decreased safety awareness;Decreased knowledge of precautions;Decreased strength;Decreased cognition          PT Treatment Interventions DME instruction;Gait training;Functional mobility training;Therapeutic  activities;Therapeutic exercise;Balance training;Patient/family education    PT Goals (Current goals can be found in the Care Plan section)  Acute Rehab PT Goals Patient Stated Goal: to go home PT Goal Formulation: With patient Time For Goal Achievement: 10/31/16 Potential to Achieve Goals: Good    Frequency Min 4X/week   Barriers to discharge        Co-evaluation PT/OT/SLP Co-Evaluation/Treatment: Yes Reason for Co-Treatment: Complexity of the patient's impairments (multi-system involvement) PT goals addressed during session: Mobility/safety with mobility         End of Session Equipment Utilized During Treatment: Gait belt;Oxygen Activity Tolerance: Patient limited by fatigue Patient left: in chair;with call bell/phone within reach;with chair alarm set;with family/visitor present Nurse Communication: Mobility status         Time: 1425-1453 PT Time Calculation (min) (ACUTE ONLY): 28 min   Charges:   PT Evaluation $PT Eval Moderate Complexity: 1 Procedure     PT G Codes:        Jeray Shugart F Jaylene Schrom 10/31/16, 3:18 PM [facility] [phone] (pager)

## 2016-10-17 NOTE — Progress Notes (Signed)
SLP Cancellation Note  Patient Details Name: Darlina RumpfHue Volkman MRN: [id number] DOB: 01/06/1941   Cancelled treatment:       Reason Eval/Treat Not Completed: Medical issues which prohibited therapy (Intubated, signing off. Please re consult when ready. )  Ferdinand LangoLeah Serafin [place] MA, CCC-SLP [phone]  Carlson Belland Meryl 10/17/2016, 8:37 AM

## 2016-10-17 NOTE — Progress Notes (Signed)
eLink Physician-Brief Progress Note Patient Name: [name] DOB: 07/23/1941 MRN: [id number]   Date of Service  10/17/2016  HPI/Events of Note  lft wnl rn req tylenal supp  eICU Interventions  Ordered q6h prn     Intervention Category Minor Interventions: Routine modifications to care plan (e.g. PRN medications for pain, fever)  Nelda BucksFEINSTEIN,Britteny Fiebelkorn J. 10/17/2016, 5:38 PM

## 2016-10-17 NOTE — Progress Notes (Signed)
  Echocardiogram 2D Echocardiogram has been performed.  Leta JunglingCooper, Jermine Bibbee M 10/17/2016, 9:52 AM

## 2016-10-17 NOTE — Progress Notes (Signed)
Rehab Admissions Coordinator Note:  Patient was screened by Clois DupesBoyette, Lanina Larranaga Godwin for appropriateness for an Inpatient Acute Rehab Consult.  At this time, we are recommending Inpatient Rehab consult.  Clois DupesBoyette, Christhoper Busbee Godwin 10/17/2016, 3:27 PM  I can be reached at [phone].

## 2016-10-17 NOTE — Progress Notes (Signed)
OT Cancellation Note  Patient Details Name: [name] MRN: [id number] DOB: 10/23/1940   Cancelled Treatment:    Reason Eval/Treat Not Completed: Patient not medically ready (Pt with active bedrest orders. Possible extubation this AM.) Please d/c bedrest orders/update activity orders when pt medically appropriate for therapy. Will follow up for OT eval as time allows.  Gaye AlkenBailey A Alistair Senft M.S., OTR/L Pager: [phone]  10/17/2016, 8:47 AM

## 2016-10-17 NOTE — Procedures (Signed)
Extubation Procedure Note  Patient Details:   Name: [name] DOB: 06/18/1941 MRN: [id number]   Airway Documentation:     Evaluation  O2 sats: stable throughout Complications: No apparent complications Patient did tolerate procedure well. Bilateral Breath Sounds: Coarse crackles      Yes  Pt's daughter at bedside. Pt placed on 4lpn [place] tolerated well   Melanee Spryelson, Fujiko Picazo Lawson 10/17/2016, 9:21 AM

## 2016-10-17 NOTE — Progress Notes (Signed)
PULMONARY / CRITICAL CARE MEDICINE   Name: [name] MRN: [id number] DOB: 11/10/1940    ADMISSION DATE:  10/15/2016 CONSULTATION DATE:  12/30  REFERRING MD:  Otelia LimesLindzen   CHIEF COMPLAINT:  Ventilator management and critical care services    SUBJECTIVE:  Passed SBT Ready for extubation   VITAL SIGNS: BP 134/66   Pulse 80   Temp 97.4 F (36.3 C) (Axillary)   Resp (!) 23   Ht 4\' 10"  (1.473 m)   Wt 136 lb 0.4 oz (61.7 kg)   SpO2 100%   BMI 28.43 kg/m   HEMODYNAMICS:    VENTILATOR SETTINGS: Vent Mode: PRVC FiO2 (%):  [40 %] 40 % Set Rate:  [14 bmp] 14 bmp Vt Set:  [500 mL] 500 mL PEEP:  [5 cmH20] 5 cmH20 Plateau Pressure:  [20 cmH20-31 cmH20] 23 cmH20  INTAKE / OUTPUT: I/O last 3 completed shifts: In: 1838.8 [I.V.:1708.8; NG/GT:130] Out: 1700 [Urine:1700]   PHYSICAL EXAMINATION: General:  Well nourished female, sedated on vent  Neuro:  Opens eyes to verbal request. Weak on the L but continues to improve HEENT:  Orally intubated. No JVD Cardiovascular:  Regular irreg II/VI SEM  Lungs:  Clear w/ equal chest rise  Abdomen:  Soft, not tender + bowel sounds  Musculoskeletal:  No deformities Skin:  Warm and dry   LABS:  BMET  Recent Labs Lab 10/15/16 0946 10/15/16 0952 10/16/16 0356  NA 143 142 139  K 3.9 3.8 3.5  CL 104 103 111  CO2 27  --  19*  BUN 14 16 9   CREATININE 0.68 0.70 0.58  GLUCOSE 142* 143* 125*    Electrolytes  Recent Labs Lab 10/15/16 0946 10/16/16 0356  CALCIUM 10.4* 8.4*    CBC  Recent Labs Lab 10/16/16 0356 10/16/16 1321 10/17/16 0828  WBC 8.4 10.0 10.0  HGB 11.1* 11.9* 11.8*  HCT 33.0* 36.0 35.0*  PLT 289 272 307    Coag's  Recent Labs Lab 10/15/16 0946  APTT 43*  INR 0.95    Sepsis Markers No results for input(s): LATICACIDVEN, PROCALCITON, O2SATVEN in the last 168 hours.  ABG  Recent Labs Lab 10/15/16 1618  PHART 7.434  PCO2ART 35.1  PO2ART 339.0*    Liver Enzymes  Recent Labs Lab  10/15/16 0946  AST 22  ALT 16  ALKPHOS 42  BILITOT 0.6  ALBUMIN 4.0    Cardiac Enzymes No results for input(s): TROPONINI, PROBNP in the last 168 hours.  Glucose  Recent Labs Lab 10/15/16 0945 10/15/16 2028  GLUCAP 134* 122*    [facility] Wo Cont[facility]  Result Date: 10/16/2016 CLINICAL DATA:  Acute onset of left facial droop and left hemiplegia. Followup after right MCA thrombectomy. EXAM: MRI HEAD WITHOUT CONTRAST MRA HEAD WITHOUT CONTRAST MRA NECK WITHOUT CONTRAST TECHNIQUE: Multiplanar, multiecho pulse sequences of the brain and surrounding structures were obtained without intravenous contrast. Angiographic images of the Circle of Willis were obtained using MRA technique without intravenous contrast. Angiographic images of the neck were obtained using MRA technique without intravenous contrast. Carotid stenosis measurements (when applicable) are obtained utilizing NASCET criteria, using the distal internal carotid diameter as the denominator. COMPARISON:  Multiple studies 10/15/2016 FINDINGS: MRI HEAD FINDINGS Brain: The study is degraded by a pronounced artifact related to metal in the region of the mouth or jaw. Diffusion imaging shows acute infarction affecting the right caudate and putamen. There is internal petechial hemorrhage but no frank hematoma. Punctate focus of acute infarction at the deep  insula. No other more peripheral acute infarction. Elsewhere, the brain shows chronic small-vessel ischemic changes of the cerebral hemispheric white matter. The area of acute infarction shows mild swelling but there is no midline shift. No extra-axial collection. Vascular: Major vessels at the base of the brain show flow. Skull and upper cervical spine: Negative, with limitations by artifact. Sinuses/Orbits: Some fluid in the paranasal sinuses. Limited by artifact. Other: None MRA HEAD FINDINGS Both internal carotid arteries are widely patent through the siphon region. No stenosis.  The anterior and middle cerebral vessels are patent without proximal stenosis, aneurysm or vascular malformation. Successful recanalization of the right middle cerebral artery. Fetal origin of the right PCA. Both vertebral arteries are patent to the basilar. No basilar stenosis. Posterior circulation branch vessels are normal. MRA NECK FINDINGS Very limited by metal artifact. Not much more can be sad other than there is antegrade flow an both carotid and vertebral systems. IMPRESSION: Acute infarction affecting the right putamen and caudate. Petechial blood products without frank hematoma. Mild swelling but no shift. Punctate acute infarction in the deep insula. No other more peripheral acute infarction. Neck MR angiography nondiagnostic because of extensive metal artifact. Intracranial MR angiography shows wide patency of the vessels with re- cannulization of the right MCA. Good distal vessel flow. Electronically Signed   By: Paulina Fusi M.D.   On: 10/16/2016 17:36   Mr Maxine Glenn Neck Wo Contrast  Result Date: 10/16/2016 CLINICAL DATA:  Acute onset of left facial droop and left hemiplegia. Followup after right MCA thrombectomy. EXAM: MRI HEAD WITHOUT CONTRAST MRA HEAD WITHOUT CONTRAST MRA NECK WITHOUT CONTRAST TECHNIQUE: Multiplanar, multiecho pulse sequences of the brain and surrounding structures were obtained without intravenous contrast. Angiographic images of the Circle of Willis were obtained using MRA technique without intravenous contrast. Angiographic images of the neck were obtained using MRA technique without intravenous contrast. Carotid stenosis measurements (when applicable) are obtained utilizing NASCET criteria, using the distal internal carotid diameter as the denominator. COMPARISON:  Multiple studies 10/15/2016 FINDINGS: MRI HEAD FINDINGS Brain: The study is degraded by a pronounced artifact related to metal in the region of the mouth or jaw. Diffusion imaging shows acute infarction affecting the  right caudate and putamen. There is internal petechial hemorrhage but no frank hematoma. Punctate focus of acute infarction at the deep insula. No other more peripheral acute infarction. Elsewhere, the brain shows chronic small-vessel ischemic changes of the cerebral hemispheric white matter. The area of acute infarction shows mild swelling but there is no midline shift. No extra-axial collection. Vascular: Major vessels at the base of the brain show flow. Skull and upper cervical spine: Negative, with limitations by artifact. Sinuses/Orbits: Some fluid in the paranasal sinuses. Limited by artifact. Other: None MRA HEAD FINDINGS Both internal carotid arteries are widely patent through the siphon region. No stenosis. The anterior and middle cerebral vessels are patent without proximal stenosis, aneurysm or vascular malformation. Successful recanalization of the right middle cerebral artery. Fetal origin of the right PCA. Both vertebral arteries are patent to the basilar. No basilar stenosis. Posterior circulation branch vessels are normal. MRA NECK FINDINGS Very limited by metal artifact. Not much more can be sad other than there is antegrade flow an both carotid and vertebral systems. IMPRESSION: Acute infarction affecting the right putamen and caudate. Petechial blood products without frank hematoma. Mild swelling but no shift. Punctate acute infarction in the deep insula. No other more peripheral acute infarction. Neck MR angiography nondiagnostic because of extensive metal artifact. Intracranial  MR angiography shows wide patency of the vessels with re- cannulization of the right MCA. Good distal vessel flow. Electronically Signed   By: Paulina Fusi M.D.   On: 10/16/2016 17:36   Mr Brain Wo Contrast  Result Date: 10/16/2016 CLINICAL DATA:  Acute onset of left facial droop and left hemiplegia. Followup after right MCA thrombectomy. EXAM: MRI HEAD WITHOUT CONTRAST MRA HEAD WITHOUT CONTRAST MRA NECK WITHOUT  CONTRAST TECHNIQUE: Multiplanar, multiecho pulse sequences of the brain and surrounding structures were obtained without intravenous contrast. Angiographic images of the Circle of Willis were obtained using MRA technique without intravenous contrast. Angiographic images of the neck were obtained using MRA technique without intravenous contrast. Carotid stenosis measurements (when applicable) are obtained utilizing NASCET criteria, using the distal internal carotid diameter as the denominator. COMPARISON:  Multiple studies 10/15/2016 FINDINGS: MRI HEAD FINDINGS Brain: The study is degraded by a pronounced artifact related to metal in the region of the mouth or jaw. Diffusion imaging shows acute infarction affecting the right caudate and putamen. There is internal petechial hemorrhage but no frank hematoma. Punctate focus of acute infarction at the deep insula. No other more peripheral acute infarction. Elsewhere, the brain shows chronic small-vessel ischemic changes of the cerebral hemispheric white matter. The area of acute infarction shows mild swelling but there is no midline shift. No extra-axial collection. Vascular: Major vessels at the base of the brain show flow. Skull and upper cervical spine: Negative, with limitations by artifact. Sinuses/Orbits: Some fluid in the paranasal sinuses. Limited by artifact. Other: None MRA HEAD FINDINGS Both internal carotid arteries are widely patent through the siphon region. No stenosis. The anterior and middle cerebral vessels are patent without proximal stenosis, aneurysm or vascular malformation. Successful recanalization of the right middle cerebral artery. Fetal origin of the right PCA. Both vertebral arteries are patent to the basilar. No basilar stenosis. Posterior circulation branch vessels are normal. MRA NECK FINDINGS Very limited by metal artifact. Not much more can be sad other than there is antegrade flow an both carotid and vertebral systems. IMPRESSION: Acute  infarction affecting the right putamen and caudate. Petechial blood products without frank hematoma. Mild swelling but no shift. Punctate acute infarction in the deep insula. No other more peripheral acute infarction. Neck MR angiography nondiagnostic because of extensive metal artifact. Intracranial MR angiography shows wide patency of the vessels with re- cannulization of the right MCA. Good distal vessel flow. Electronically Signed   By: Paulina Fusi M.D.   On: 10/16/2016 17:36   Dg Chest Port 1 View  Result Date: 10/17/2016 CLINICAL DATA:  75 year old female who presented with right MCA emergent large vessel occlusion. Status post Neuro-endovascular reperfusion. Right basal ganglia infarction. Respiratory failure. Initial encounter. EXAM: PORTABLE CHEST 1 VIEW COMPARISON:  10/16/2016 and earlier. FINDINGS: Portable AP semi upright view at 0654 hours. Mildly rotated to the left. Endotracheal tube tip in good position between the level the clavicles and carina. Enteric tube courses to the abdomen with side hole to level of the gastric body. Mildly improved lung volumes since yesterday. Stable cardiac size and mediastinal contours. No confluent pulmonary opacity. No pneumothorax, pulmonary edema, or pleural effusion. IMPRESSION: 1.  Stable lines and tubes. 2. Mildly improved lung volumes since yesterday who with no convincing acute cardiopulmonary abnormality. Electronically Signed   By: Odessa Fleming M.D.   On: 10/17/2016 07:55   Dg Chest Port 1 View  Result Date: 10/16/2016 CLINICAL DATA:  Ventilator EXAM: PORTABLE CHEST 1 VIEW COMPARISON:  10/15/2016  FINDINGS: Endotracheal tube is 3 cm above the carina. NG tube is in the stomach. Bilateral lower lobe airspace opacities, slightly increased. No visible effusions or acute bony abnormality. Heart is borderline in size. IMPRESSION: Increasing bibasilar atelectasis or infiltrates. Electronically Signed   By: Charlett Nose M.D.   On: 10/16/2016 12:09     STUDIES:   CT head 12/30: 1. Positive for hyperdense right MCA bifurcation highly suspicious for emergent large vessel occlusion in this clinical setting. No intracranial hemorrhage or changes of acute cortically based infarct CT head w/ perfusion 12/30: Positive for emergent large vessel occlusion at the distal right M1 or right MCA bifurcation.Core infarct volume estimated at 17 mL, with large area of right MCA territory penumbra (estimated at 160 mL).  CULTURES:  ANTIBIOTICS:   SIGNIFICANT EVENTS: 12/30 admitted to ED. Large right MCA CVA. To IR for revascularization   LINES/TUBES: ETT 12/30 >> 1/1 DISCUSSION: 77 year old female admitted 12/30 w/ large right MCA CVA w/ associated left sided weakness. Went to IR where she underwent successful clot retrieval and re-vascularization. Returned to ICU in vent.  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  P:  Goal SBP 120-140 Cont tele   RENAL A:   Relative oliguria  P:   Strict I&O Avoid hypotension  F/u chemistry  GASTROINTESTINAL A:   NPO status  At risk for dysphagia  P:   NPO for now Bedside swallow eval    HEMATOLOGIC A:   No acute  P:  Trend CBC  Pas to LEs   INFECTIOUS A:   No acute  P:   Trend fever and WBC curve   ENDOCRINE A:   Mild hyperglycemia  P:   ssi    FAMILY  - Updates: updated family at bedside 12/31  - Inter-disciplinary family meet or Palliative Care meeting due by:  1/5  my critical care time 35 minutes.   Simonne Martinet ACNP-BC [facility] Pager # [phone] OR # [phone] if no answer  Attending Note:   I have examined patient, reviewed labs, studies and notes. I have discussed the case with Kreg Shropshire, and I agree with the data and plans as amended above. 76 yo woman, admitted with acute altered MS due to CVA. She underwent IR clot retrieval. On eval today she is awake, tolerating PSV with clear lung exam. Better L sided strength. Plan extubate today, push pulm hygiene, PT/OT, speech therapy for swallowing eval . Independent critical care time is 40 minutes.   Levy Pupa, MD, PhD 10/17/2016, 10:29 PM [facility] [phone] or if no answer [phone]

## 2016-10-17 NOTE — Progress Notes (Signed)
STROKE TEAM PROGRESS NOTE   SUBJECTIVE (INTERVAL HISTORY) Daughter at bedside. Pt was extubated this am and tolerated well. Complaining of sore throat. She is fully orientated and follows commands. Mild left hemiparesis and hemiparesthesia. Discussed again with daughter about risk and benefit of anticoagulation. Speech eval pending.    OBJECTIVE Temp:  [97.4 F (36.3 C)-99.6 F (37.6 C)] 97.4 F (36.3 C) (01/01 0800) Pulse Rate:  [70-101] 80 (01/01 0830) Cardiac Rhythm: Atrial fibrillation (01/01 0000) Resp:  [12-25] 23 (01/01 0830) BP: (104-151)/(59-126) 134/66 (01/01 0830) SpO2:  [91 %-100 %] 100 % (01/01 0900) Arterial Line BP: (147-154)/(67-71) 147/67 (12/31 1100) FiO2 (%):  [40 %] 40 % (01/01 0428)  CBC:   Recent Labs Lab 10/15/16 0946  10/16/16 0356 10/16/16 1321 10/17/16 0828  WBC 6.5  --  8.4 10.0 10.0  NEUTROABS 4.1  --  6.2  --   --   HGB 14.7  < > 11.1* 11.9* 11.8*  HCT 42.9  < > 33.0* 36.0 35.0*  MCV 92.9  --  91.7 95.2 93.3  PLT 321  --  289 272 307  < > = values in this interval not displayed.  Basic Metabolic Panel:   Recent Labs Lab 10/16/16 0356 10/17/16 0828  NA 139 142  K 3.5 3.5  CL 111 111  CO2 19* 25  GLUCOSE 125* 128*  BUN 9 6  CREATININE 0.58 0.60  CALCIUM 8.4* 8.6*    Lipid Panel:     Component Value Date/Time   CHOL 128 10/16/2016 0356   TRIG 198 (H) 10/16/2016 0356   HDL 29 (L) 10/16/2016 0356   CHOLHDL 4.4 10/16/2016 0356   VLDL 40 10/16/2016 0356   LDLCALC 59 10/16/2016 0356   HgbA1c:  Lab Results  Component Value Date   HGBA1C 6.3 (H) 11/09/2014   Urine Drug Screen: No results found for: LABOPIA, COCAINSCRNUR, LABBENZ, AMPHETMU, THCU, LABBARB    IMAGING I have personally reviewed the radiological images below and agree with the radiology interpretations.  Ct Head Code Stroke W/o Cm 10/15/2016 1. Positive for hyperdense right MCA bifurcation highly suspicious for emergent large vessel occlusion in this clinical  setting. No intracranial hemorrhage or changes of acute cortically based infarct  2. ASPECTS is 10.   Ct Cerebral Perfusion W Contrast 10/15/2016 Positive for emergent large vessel occlusion at the distal right M1 or right MCA bifurcation. Core infarct volume estimated at 17 mL, with large area of right MCA territory penumbra (estimated at 160 mL). Unfortunately due to a scanner malfunction the planned CTA head and neck images were not acquired following this CT perfusion source images.   Cerebral Angiogram  10/15/2016 S/P RT common carotid arteriogram,followed by complete revascularization of occluded RT MCA M1 segment with x1 pass with the solitaireFR 40 mm retrieval device and 1.8 mg of superselective intracranial IA Integrelin with achievement of a TICI 3 reperfusion  Ct Head Wo Contrast 10/15/2016 Contrast staining of the basal ganglia on the right, typically seen following intervention. This does not necessarily indicate infarction of these structures. More peripheral right MCA territory appears normal with gray-white differentiation preservation and no swelling or shift.   Mr Brain Wo Contrast 10/16/2016 Acute infarction affecting the right putamen and caudate. Petechial blood products without frank hematoma. Mild swelling but no shift. Punctate acute infarction in the deep insula. No other more peripheral acute infarction.   Mr Shirlee Latch Wo Contrast 10/16/2016 Intracranial MR angiography shows wide patency of the vessels with re- cannulization  of the right MCA. Good distal vessel flow.   Mr Maxine Glenn Neck Wo Contrast 10/16/2016 Neck MR angiography nondiagnostic because of extensive metal artifact.   Dg Chest Port 1 View 10/17/2016  1.  Stable lines and tubes. 2. Mildly improved lung volumes since yesterday who with no convincing acute cardiopulmonary abnormality.  10/16/2016  Increasing bibasilar atelectasis or infiltrates.  10/15/2016  Endotracheal and nasogastric tubes in  grossly good position. Mild bibasilar subsegmental atelectasis. Aortic atherosclerosis.   CUS pending  TTE Left ventricle: The cavity size was normal. There was moderate   concentric hypertrophy. Systolic function was vigorous. The   estimated ejection fraction was in the range of 65% to 70%. Wall   motion was normal; there were no regional wall motion   abnormalities. - Aortic valve: Valve mobility was trivially restricted.   Transvalvular velocity was increased. There was mild stenosis.   Valve area (VTI): 0.86 cm^2. Valve area (Vmax): 1.05 cm^2. Valve   area (Vmean): 0.89 cm^2. - Mitral valve: Moderately calcified annulus. There was mild   regurgitation. - Left atrium: The atrium was moderately dilated. - Right atrium: The atrium was mildly dilated. - Pulmonary arteries: Systolic pressure was mildly to moderately   increased. PA peak pressure: 41 mm Hg (S).  Impressions:  - No cardiac source of embolism was identified, but cannot be ruled   out on the basis of this examination.   PHYSICAL EXAM  Temp:  [97.4 F (36.3 C)-99.6 F (37.6 C)] 97.4 F (36.3 C) (01/01 0800) Pulse Rate:  [70-101] 80 (01/01 0830) Resp:  [12-25] 23 (01/01 0830) BP: (104-151)/(59-126) 134/66 (01/01 0830) SpO2:  [91 %-100 %] 100 % (01/01 0900) FiO2 (%):  [40 %] 40 % (01/01 0428)  General - Well nourished, well developed, in no apparent distress.  Ophthalmologic - Fundi not visualized due to noncooperation.  Cardiovascular - irregularly irregular heart rate and rhythm.  Mental Status -  Level of arousal and orientation to time, place, and person were intact. Language including expression, naming, repetition, comprehension was assessed and found intact, however, moderate dysarthria and hypophonia.  Cranial Nerves II - XII - II - Visual field intact OU. III, IV, VI - Extraocular movements intact. V - Facial sensation decreased on the left. VII - Facial movement intact bilaterally. VIII -  Hearing & vestibular intact bilaterally. X - Palate elevates symmetrically, moderate dysarthria XI - Chin turning & shoulder shrug intact bilaterally. XII - Tongue protrusion intact.  Motor Strength - The patient's strength was normal in all extremities except LUE proximal 4/5 and left hand decreased dexterity and pronator drift was present.  Bulk was normal and fasciculations were absent.   Motor Tone - Muscle tone was assessed at the neck and appendages and was normal.  Reflexes - The patient's reflexes were 1+ in all extremities and she had no pathological reflexes.  Sensory - Light touch, temperature/pinprick were assessed and were decreased on the left LE.    Coordination - The patient had normal movements in the hand with no ataxia or dysmetria.  Tremor was absent.  Gait and Station - not tested.   ASSESSMENT/PLAN Ms. Guyla Bless is a 76 y.o. female with history of diabetes mellitus, subdural hematoma in 2015, previous pneumonia, obstructive sleep apnea on C Pap, dyslipidemia, and atrial fibrillation not anticoagulated secondary to subdural hematoma history presenting with left facial droop, left hemiplegia, and right gaze deviation.  She did not receive IV t-PA due to prior history of subdural hematoma.  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 Infarcts felt to be embolic secondary to known atrial fibrillation not on anticoagulation  Resultant  Left mild hemiparesis  Code Stroke CT hyperdense R MCA. ASPECTS 10   CT Perfusion distal R M1/R MCA bifurcation emergent large vessel occlusion. 17mL core infarct volume with large penumbra (160 mL)  Cerebral Angio TICI3 revascularization R MCA M1   Post IR CT R BG contrast staining post IR  MRI R putamen/caudate infarct with patchy hemorrhagic transformation, R deep insular infarct.  MRA head patent with recannulization R MCA. Left PCA stenosis  MRA  neck non diagnostic d/t metal artifact  CUS pending  2D Echo EF 65-70%  LDL - 59  HgbA1c pending  VTE prophylaxis - Lovenox Diet NPO time specified  aspirin 81 mg daily prior to admission, now on aspirin 300 mg suppository daily. Consider eliquis 5mg  bid 14 days after for stroke prevention due to hemorrhagic transformation. Pt daughter leaning towards anticoagulation at this time.   Ongoing aggressive stroke risk factor management  Therapy recommendations: pending  Disposition: Pending  Hx of small traumatic SDH  Post fall 04/2014  Small amount of SDH at left parietal and no need surgical intervention  Atrial Fibrillation  Home anticoagulation:  none given hx SDH  Pt CHA2DS2-VASc score = 6  HAS BLED score = 4  Pt daughter leaning towards anticoagulation at this time.    Hypertension  Blood pressure running low -> IV fluid hydration - Phenylephrine  SBP goal 100 - 180  BP this am 130s/60-700s  Off neo now  Long-term BP goal normotensive  Hyperlipidemia  Home meds:  Pravachol 10 mg   LDL 59, goal < 70  Resume Pravachol when PO access is available.  Continue statin at discharge  Diabetes  HgbA1c pending, goal < 7.0  Glucoses 125-128  CBG monitoring  SSI  Other Stroke Risk Factors  Advanced age  Obstructive sleep apnea, on CPAP at home  Other Active Problems  Mild Acute blood loss anemia post procedure 11.8    Hospital day # 2  This patient is critically ill due to right MCA infarct with hemorrhagic transformation, A. fib and at significant risk of neurological worsening, death form recurrent stroke, hemorrhagic transformation, cerebral edema, brain herniation, heart failure. This patient's care requires constant monitoring of vital signs, hemodynamics, respiratory and cardiac monitoring, review of multiple databases, neurological assessment, discussion with family, other specialists and medical decision making of high complexity. I spent 40  minutes of neurocritical care time in the care of this patient.  Marvel PlanJindong Nathanel Tallman, MD PhD Stroke Neurology 10/17/2016 6:39 PM   To contact Stroke Continuity provider, please refer to [email or web address]. After hours, contact General Neurology

## 2016-10-18 ENCOUNTER — Inpatient Hospital Stay (HOSPITAL_COMMUNITY): Payer: Medicare Other

## 2016-10-18 DIAGNOSIS — Z9181 History of falling: Secondary | ICD-10-CM

## 2016-10-18 DIAGNOSIS — D72829 Elevated white blood cell count, unspecified: Secondary | ICD-10-CM

## 2016-10-18 DIAGNOSIS — G441 Vascular headache, not elsewhere classified: Secondary | ICD-10-CM

## 2016-10-18 DIAGNOSIS — E46 Unspecified protein-calorie malnutrition: Secondary | ICD-10-CM

## 2016-10-18 DIAGNOSIS — I639 Cerebral infarction, unspecified: Secondary | ICD-10-CM

## 2016-10-18 DIAGNOSIS — G8194 Hemiplegia, unspecified affecting left nondominant side: Secondary | ICD-10-CM

## 2016-10-18 DIAGNOSIS — Z8679 Personal history of other diseases of the circulatory system: Secondary | ICD-10-CM

## 2016-10-18 DIAGNOSIS — I4891 Unspecified atrial fibrillation: Secondary | ICD-10-CM | POA: Diagnosis present

## 2016-10-18 DIAGNOSIS — J9600 Acute respiratory failure, unspecified whether with hypoxia or hypercapnia: Secondary | ICD-10-CM | POA: Diagnosis not present

## 2016-10-18 DIAGNOSIS — J9601 Acute respiratory failure with hypoxia: Secondary | ICD-10-CM | POA: Diagnosis not present

## 2016-10-18 DIAGNOSIS — I69391 Dysphagia following cerebral infarction: Secondary | ICD-10-CM

## 2016-10-18 DIAGNOSIS — D62 Acute posthemorrhagic anemia: Secondary | ICD-10-CM | POA: Diagnosis not present

## 2016-10-18 DIAGNOSIS — E876 Hypokalemia: Secondary | ICD-10-CM

## 2016-10-18 LAB — COMPREHENSIVE METABOLIC PANEL
ALBUMIN: 3.2 g/dL — AB (ref 3.5–5.0)
ALT: 12 U/L — ABNORMAL LOW (ref 14–54)
ANION GAP: 9 (ref 5–15)
AST: 14 U/L — AB (ref 15–41)
Alkaline Phosphatase: 34 U/L — ABNORMAL LOW (ref 38–126)
BUN: 7 mg/dL (ref 6–20)
CO2: 21 mmol/L — AB (ref 22–32)
Calcium: 8.3 mg/dL — ABNORMAL LOW (ref 8.9–10.3)
Chloride: 110 mmol/L (ref 101–111)
Creatinine, Ser: 0.63 mg/dL (ref 0.44–1.00)
GFR calc Af Amer: >60 mL/min (ref >60)
GFR calc non Af Amer: >60 mL/min (ref >60)
GLUCOSE: 100 mg/dL — AB (ref 65–99)
POTASSIUM: 3.4 mmol/L — AB (ref 3.5–5.1)
SODIUM: 140 mmol/L (ref 135–145)
Total Bilirubin: 0.9 mg/dL (ref 0.3–1.2)
Total Protein: 6.2 g/dL — ABNORMAL LOW (ref 6.5–8.1)

## 2016-10-18 LAB — CBC
HEMATOCRIT: 34.2 % — AB (ref 36.0–46.0)
HEMOGLOBIN: 11.2 g/dL — AB (ref 12.0–15.0)
MCH: 30.4 pg (ref 26.0–34.0)
MCHC: 32.7 g/dL (ref 30.0–36.0)
MCV: 92.9 fL (ref 78.0–100.0)
Platelets: 293 10*3/uL (ref 150–400)
RBC: 3.68 MIL/uL — ABNORMAL LOW (ref 3.87–5.11)
RDW: 12.1 % (ref 11.5–15.5)
WBC: 10.7 10*3/uL — AB (ref 4.0–10.5)

## 2016-10-18 LAB — VAS US CAROTID
LCCADDIAS: -22 cm/s
LCCADSYS: -72 cm/s
LCCAPSYS: 59 cm/s
LEFT ECA DIAS: -7 cm/s
LEFT VERTEBRAL DIAS: 14 cm/s
LICADSYS: -50 cm/s
Left CCA prox dias: 20 cm/s
Left ICA dist dias: -19 cm/s
Left ICA prox dias: -18 cm/s
Left ICA prox sys: -60 cm/s
RCCADSYS: -97 cm/s
RCCAPDIAS: 17 cm/s
RIGHT ECA DIAS: -16 cm/s
RIGHT VERTEBRAL DIAS: 8 cm/s
Right CCA prox sys: 59 cm/s

## 2016-10-18 LAB — GLUCOSE, CAPILLARY
GLUCOSE-CAPILLARY: 112 mg/dL — AB (ref 65–99)
GLUCOSE-CAPILLARY: 73 mg/dL (ref 65–99)
GLUCOSE-CAPILLARY: 100 mg/dL — AB (ref 65–99)
GLUCOSE-CAPILLARY: 116 mg/dL — AB (ref 65–99)
Glucose-Capillary: 90 mg/dL (ref 65–99)

## 2016-10-18 LAB — HEMOGLOBIN A1C
Hgb A1c MFr Bld: 6.1 % — ABNORMAL HIGH (ref 4.8–5.6)
Mean Plasma Glucose: 128 mg/dL

## 2016-10-18 MED ORDER — RESOURCE THICKENUP CLEAR PO POWD
Freq: Once | ORAL | Status: DC
  Filled 2016-10-18: qty 125

## 2016-10-18 MED ORDER — POTASSIUM CHLORIDE CRYS ER 20 MEQ PO TBCR: 40.0000 meq | EXTENDED_RELEASE_TABLET | Freq: Once | ORAL | Status: DC

## 2016-10-18 MED ORDER — MORPHINE SULFATE (PF) 2 MG/ML IV SOLN
1.0000 mg | INTRAVENOUS | Status: DC | PRN
  Administered 2016-10-18: 1 mg via INTRAVENOUS
  Filled 2016-10-18: qty 1

## 2016-10-18 MED ORDER — BUTALBITAL-APAP-CAFFEINE 50-325-40 MG PO TABS
1.0000 | ORAL_TABLET | Freq: Two times a day (BID) | ORAL | Status: DC | PRN
Start: 2016-10-18 — End: 2016-10-19
  Administered 2016-10-19 (×2): 1 via ORAL
  Filled 2016-10-18 (×2): qty 1

## 2016-10-18 MED ORDER — PANTOPRAZOLE SODIUM 40 MG PO TBEC
40.0000 mg | DELAYED_RELEASE_TABLET | Freq: Every day | ORAL | Status: DC
  Filled 2016-10-18: qty 1

## 2016-10-18 MED ORDER — POTASSIUM CHLORIDE 20 MEQ PO PACK
40.0000 meq | PACK | Freq: Once | ORAL | Status: AC
  Administered 2016-10-18: 40 meq via ORAL
  Filled 2016-10-18: qty 2

## 2016-10-18 NOTE — Consult Note (Signed)
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 right handed female with history of atrial fibrillation on no anticoagulation due to history of falls and SDH, diabetes mellitus.  History taken from chart review and daughter. Presented 10/15/2016 with left-sided weakness and facial droop.. Patient lives with daughter and son-in-law who work. She does have another daughter who checks on her but cannot provide 24-hour care. Reported to be independent with assistive device prior to admission. Two-level home with bedroom on first floor and 3 steps to entry. Cranial CT scan showed positive hyperdense right MCA bifurcation suspicious for emergent large vessel occlusion. No intracranial hemorrhage. CT cerebral perfusion scan positive for emergent large vessel occlusion distal right M1 or right MCA bifurcation. Cerebral angiogram and underwent complete revascularization of occluded right MCA M1 segment per interventional radiology. 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 Petechial blood products without frank hematoma. Mild swelling without shift. MRA of the head 10/16/2016 wide patency of the vessels with recannulization of the right MCA. Echocardiogram with ejection fraction of 70% no wall motion abnormalities. Carotid Dopplers are pending. Patient remained ventilatory support extubated 10/17/2016. Maintained on intravenous Cardizem for atrial fibrillation. Aspirin for CVA prophylaxis await plan for possible Eliquis 5 mg twice a day 14 days after stroke prevention due to hemorrhagic transformation. Physical and occupational therapy evaluations completed 10/17/2016 with recommendations of physical medicine rehabilitation consult.   Review of Systems  Unable to perform ROS: Language   Past Medical History:  Diagnosis Date  . Atrial  fibrillation (HCC)   . Chronic back pain    "mid back down into lower back" (08/25/2014)  . GERD (gastroesophageal reflux disease)   . Hypertriglyceridemia   . OSA on CPAP   . Osteoarthritis    "knees, hands, back" (08/25/2014)  . Pneumonia ~ 2000 X 1  . Subdural hematoma (HCC) july 2015   S/P fall while on Coumadin  . T12 compression fracture (HCC) 2012  . Type II diabetes mellitus (HCC)    Past Surgical History:  Procedure Laterality Date  . APPENDECTOMY  2012  . CATARACT EXTRACTION W/ INTRAOCULAR LENS  IMPLANT, BILATERAL Bilateral 2000's  . RADIOLOGY WITH ANESTHESIA N/A 10/15/2016   Procedure: RADIOLOGY WITH ANESTHESIA;  Surgeon: [name], MD;  Location: [facility] OR;  Service: Radiology;  Laterality: N/A;  . TOTAL ABDOMINAL HYSTERECTOMY  1990   Family History  Problem Relation Age of Onset  . Family history unknown: Yes   Social History:  reports that she is a non-smoker but has been exposed to tobacco smoke. She has never used smokeless tobacco. She reports that she does not drink alcohol or use drugs. Allergies:  Allergies  Allergen Reactions  . Banana Swelling    Hands and feet  . Chocolate Itching  . Peanut-Containing Drug Products Itching    Throat itches, no swelling   Medications Prior to Admission  Medication Sig Dispense Refill  . albuterol (ACCUNEB) 0.63 MG/3ML nebulizer solution Take 0.63 mg by nebulization every 6 (six) hours as needed. When unable to use inaler    . alendronate (FOSAMAX) 70 MG tablet Take 70 mg by mouth every Monday.     [name] aspirin EC 81 MG tablet Take 81 mg by mouth daily.    [name] azithromycin (ZITHROMAX) 250 MG tablet Take 250-500 mg by mouth See admin instructions. Pt takes  500mg  on first day 12/28, then 250mg  daily therafter    . b complex vitamins tablet Take 1 tablet by mouth daily.    . Calcium-Vitamin D (CALTRATE 600 PLUS-VIT D PO) Take 1 tablet by mouth 2 (two) times daily.    . chlorpheniramine-HYDROcodone (TUSSIONEX PENNKINETIC ER)  10-8 MG/5ML LQCR Take 5 mLs by mouth every 12 (twelve) hours as needed for cough. (Patient taking differently: Take 2.5-5 mLs by mouth every 12 (twelve) hours as needed for cough. ) 115 mL 0  . fluticasone (FLONASE) 50 MCG/ACT nasal spray Place 1 spray into both nostrils daily as needed for allergies.     . fluticasone (FLOVENT HFA) 110 MCG/ACT inhaler Inhale 1 puff into the lungs 2 (two) times daily.    [name] HYDROcodone-acetaminophen (NORCO/VICODIN) 5-325 MG per tablet Take 1 tablet by mouth every 6 (six) hours as needed for severe pain. 30 tablet 0  . Krill Oil 1000 MG CAPS Take 1,000 mg by mouth 2 (two) times daily.     [name] levalbuterol (XOPENEX HFA) 45 MCG/ACT inhaler Inhale 1 puff into the lungs every 4 (four) hours as needed.    . metFORMIN (GLUCOPHAGE-XR) 500 MG 24 hr tablet Take 500 mg by mouth 2 (two) times daily.     . metoprolol tartrate (LOPRESSOR) 25 MG tablet Take 12.5 mg by mouth 2 (two) times daily.    . montelukast (SINGULAIR) 10 MG tablet Take 10 mg by mouth at bedtime.   12  . Multiple Vitamin (DAILY MULTIVITAMIN PO) Take 1 tablet by mouth daily.    . pravastatin (PRAVACHOL) 10 MG tablet Take 10 mg by mouth daily.    . traMADol (ULTRAM) 50 MG tablet Take 1 tablet (50 mg total) by mouth 2 (two) times daily. (Patient taking differently: Take 50 mg by mouth 2 (two) times daily as needed. ) 30 tablet 0  . albuterol (PROVENTIL HFA;VENTOLIN HFA) 108 (90 BASE) MCG/ACT inhaler Inhale 2 puffs into the lungs every 6 (six) hours as needed for wheezing or shortness of breath. (Patient not taking: Reported on 10/16/2016) 1 Inhaler 2  . bacitracin ointment Apply 1 application topically 2 (two) times daily. (Patient not taking: Reported on 10/16/2016) 120 g 0  . docusate sodium 100 MG CAPS Take 100 mg by mouth 2 (two) times daily. (Patient not taking: Reported on 10/16/2016) 10 capsule 0  . levofloxacin (LEVAQUIN) 750 MG tablet Take 1 tablet (750 mg total) by mouth daily. (Patient not taking: Reported  on 10/16/2016) 6 tablet 0    Home: Home Living Family/patient expects to be discharged to:: Private residence Living Arrangements: Children Available Help at Discharge: Family, Available PRN/intermittently (lives with daughter who works, sister can come by) Type of Home: House Home Access: Stairs to enter Secretary/administrator of Steps: 3 Entrance Stairs-Rails: Right Home Layout: Two level, Able to live on main level with bedroom/bathroom Alternate Level Stairs-Number of Steps: 12 Alternate Level Stairs-Rails: Right Bathroom Shower/Tub: Engineer, manufacturing systems: Standard Bathroom Accessibility: Yes Home Equipment: Dan Humphreys - standard, Cane - single point  Functional History: Prior Function Level of Independence: Independent with assistive device(s), Needs assistance Gait / Transfers Assistance Needed: PTA, used cane intermittently per daughter ADL's / Homemaking Assistance Needed: I with ADLs Functional Status:  Mobility: Bed Mobility Overal bed mobility: Needs Assistance Bed Mobility: Supine to Sit Supine to sit: +2 for safety/equipment, HOB elevated, Min assist General bed mobility comments: Assist for moving LEs off bed and for elevation of trunk. Transfers Overall transfer level: Needs  assistance Equipment used: 2 person hand held assist Transfers: Sit to/from Stand Sit to Stand: Min assist, +2 safety/equipment General transfer comment: Pt needed steadying assist of 2 persons HHA.  Slight posterior lean as well as flexed posture therefore unsteady on feet.  Ambulation/Gait Ambulation/Gait assistance: Min assist, +2 safety/equipment Ambulation Distance (Feet): 47 Feet (12 feet and then 35 feet) Assistive device: 2 person hand held assist Gait Pattern/deviations: Step-to pattern, Decreased step length - right, Decreased step length - left, Decreased stride length, Shuffle, Drifts right/left, Trunk flexed, Wide base of support General Gait Details: Pt was able to  ambulate with intiailly 2 person assist and progressing to 1 person assist.  Pt with wide BOS with short steps but did lengthen steps with commands.  Pt with flexed posture as well.  Pt needed steading assist but for the most part was min assist with 2ned person for lines and safety.  Gait velocity interpretation: Below normal speed for age/gender    ADL: ADL Overall ADL's : Needs assistance/impaired Eating/Feeding: Minimal assistance, Sitting Grooming: Moderate assistance, Sitting Upper Body Bathing: Moderate assistance, Sitting Lower Body Bathing: Moderate assistance, Sit to/from stand Upper Body Dressing : Minimal assistance, Sitting Lower Body Dressing: Moderate assistance, Sit to/from stand Lower Body Dressing Details (indicate cue type and reason): Pt able to don socks sitting EOB with min assist Toilet Transfer: Minimal assistance, +2 for physical assistance, Ambulation, Regular Toilet, Grab bars Toileting- Clothing Manipulation and Hygiene: Min guard, Sitting/lateral lean Toileting - Clothing Manipulation Details (indicate cue type and reason): for peri care only Functional mobility during ADLs: Minimal assistance, +2 for physical assistance General ADL Comments: Educated pt and daughter on functional use of LUE and continued ROM throughout the day.  Cognition: Cognition Overall Cognitive Status: Impaired/Different from baseline Orientation Level: Oriented to person, Oriented to place, Oriented to time, Disoriented to situation (said she fell but needs reminder of stroke) Cognition Arousal/Alertness: Awake/alert Behavior During Therapy: Flat affect Overall Cognitive Status: Impaired/Different from baseline Area of Impairment: Following commands, Safety/judgement, Awareness, Problem solving Following Commands: Follows one step commands with increased time Safety/Judgement: Decreased awareness of safety, Decreased awareness of deficits Problem Solving: Difficulty sequencing,  Requires verbal cues, Requires tactile cues General Comments: Question whether pt has left inattention to task. Difficult to assess cognition fully due to language barrier as well.   Blood pressure 107/73, pulse 89, temperature 97.6 F (36.4 C), temperature source Oral, resp. rate 14, height 4\' 10"  (1.473 m), weight 61.7 kg (136 lb 0.4 oz), SpO2 96 %. Physical Exam  Vitals reviewed. Constitutional: She appears well-developed and well-nourished.  HENT:  Head: Normocephalic and atraumatic.  Eyes: Conjunctivae and EOM are normal.  Neck: Normal range of motion. Neck supple. No tracheal deviation present. No thyromegaly present.  Cardiovascular:  Irregularly irregular  Respiratory: Effort normal and breath sounds normal. No respiratory distress.  GI: Soft. Bowel sounds are normal. She exhibits no distension.  Musculoskeletal: She exhibits no edema or tenderness.  Neurological: She is alert.  Exam limited by language barrier.  She does follow simple demonstrated commands. Appears oriented x3 per daughter Motor: RUE: 4+/5 proximal to distal LUE: 4/5 proximal to distal RLE: 4/5 (pain inhibition - baseline) LLE: 4/5 proximal to distal Sensation intact to light touch DTRs symmetric  Skin: Skin is warm and dry.  Psychiatric:  Unable to assess due to language barrier    Results for orders placed or performed during the hospital encounter of 10/15/16 (from the past 24 hour(s))  CBC  Status: Abnormal   Collection Time: 10/17/16  8:28 AM  Result Value Ref Range   WBC 10.0 4.0 - 10.5 K/uL   RBC 3.75 (L) 3.87 - 5.11 MIL/uL   Hemoglobin 11.8 (L) 12.0 - 15.0 g/dL   HCT 91.4 (L) 78.2 - 95.6 %   MCV 93.3 78.0 - 100.0 fL   MCH 31.5 26.0 - 34.0 pg   MCHC 33.7 30.0 - 36.0 g/dL   RDW 21.3 08.6 - 57.8 %   Platelets 307 150 - 400 K/uL  Basic metabolic panel     Status: Abnormal   Collection Time: 10/17/16  8:28 AM  Result Value Ref Range   Sodium 142 135 - 145 mmol/L   Potassium 3.5 3.5 -  5.1 mmol/L   Chloride 111 101 - 111 mmol/L   CO2 25 22 - 32 mmol/L   Glucose, Bld 128 (H) 65 - 99 mg/dL   BUN 6 6 - 20 mg/dL   Creatinine, Ser 4.69 0.44 - 1.00 mg/dL   Calcium 8.6 (L) 8.9 - 10.3 mg/dL   GFR calc non Af Amer >60 >60 mL/min   GFR calc Af Amer >60 >60 mL/min   Anion gap 6 5 - 15  Glucose, capillary     Status: None   Collection Time: 10/17/16  9:02 PM  Result Value Ref Range   Glucose-Capillary 97 65 - 99 mg/dL   Comment 1 Notify RN    Comment 2 Document in Chart   CBC     Status: Abnormal   Collection Time: 10/18/16  4:24 AM  Result Value Ref Range   WBC 10.7 (H) 4.0 - 10.5 K/uL   RBC 3.68 (L) 3.87 - 5.11 MIL/uL   Hemoglobin 11.2 (L) 12.0 - 15.0 g/dL   HCT 62.9 (L) 52.8 - 41.3 %   MCV 92.9 78.0 - 100.0 fL   MCH 30.4 26.0 - 34.0 pg   MCHC 32.7 30.0 - 36.0 g/dL   RDW 24.4 01.0 - 27.2 %   Platelets 293 150 - 400 K/uL  Comprehensive metabolic panel     Status: Abnormal   Collection Time: 10/18/16  4:24 AM  Result Value Ref Range   Sodium 140 135 - 145 mmol/L   Potassium 3.4 (L) 3.5 - 5.1 mmol/L   Chloride 110 101 - 111 mmol/L   CO2 21 (L) 22 - 32 mmol/L   Glucose, Bld 100 (H) 65 - 99 mg/dL   BUN 7 6 - 20 mg/dL   Creatinine, Ser 5.36 0.44 - 1.00 mg/dL   Calcium 8.3 (L) 8.9 - 10.3 mg/dL   Total Protein 6.2 (L) 6.5 - 8.1 g/dL   Albumin 3.2 (L) 3.5 - 5.0 g/dL   AST 14 (L) 15 - 41 U/L   ALT 12 (L) 14 - 54 U/L   Alkaline Phosphatase 34 (L) 38 - 126 U/L   Total Bilirubin 0.9 0.3 - 1.2 mg/dL   GFR calc non Af Amer >60 >60 mL/min   GFR calc Af Amer >60 >60 mL/min   Anion gap 9 5 - 15   Mr [facility] Wo Contrast  Result Date: 10/16/2016 CLINICAL DATA:  Acute onset of left facial droop and left hemiplegia. Followup after right MCA thrombectomy. EXAM: MRI HEAD WITHOUT CONTRAST MRA HEAD WITHOUT CONTRAST MRA NECK WITHOUT CONTRAST TECHNIQUE: Multiplanar, multiecho pulse sequences of the brain and surrounding structures were obtained without intravenous contrast.  Angiographic images of the Circle of Willis were obtained using MRA technique without intravenous contrast.  Angiographic images of the neck were obtained using MRA technique without intravenous contrast. Carotid stenosis measurements (when applicable) are obtained utilizing NASCET criteria, using the distal internal carotid diameter as the denominator. COMPARISON:  Multiple studies 10/15/2016 FINDINGS: MRI HEAD FINDINGS Brain: The study is degraded by a pronounced artifact related to metal in the region of the mouth or jaw. Diffusion imaging shows acute infarction affecting the right caudate and putamen. There is internal petechial hemorrhage but no frank hematoma. Punctate focus of acute infarction at the deep insula. No other more peripheral acute infarction. Elsewhere, the brain shows chronic small-vessel ischemic changes of the cerebral hemispheric white matter. The area of acute infarction shows mild swelling but there is no midline shift. No extra-axial collection. Vascular: Major vessels at the base of the brain show flow. Skull and upper cervical spine: Negative, with limitations by artifact. Sinuses/Orbits: Some fluid in the paranasal sinuses. Limited by artifact. Other: None MRA HEAD FINDINGS Both internal carotid arteries are widely patent through the siphon region. No stenosis. The anterior and middle cerebral vessels are patent without proximal stenosis, aneurysm or vascular malformation. Successful recanalization of the right middle cerebral artery. Fetal origin of the right PCA. Both vertebral arteries are patent to the basilar. No basilar stenosis. Posterior circulation branch vessels are normal. MRA NECK FINDINGS Very limited by metal artifact. Not much more can be sad other than there is antegrade flow an both carotid and vertebral systems. IMPRESSION: Acute infarction affecting the right putamen and caudate. Petechial blood products without frank hematoma. Mild swelling but no shift. Punctate  acute infarction in the deep insula. No other more peripheral acute infarction. Neck MR angiography nondiagnostic because of extensive metal artifact. Intracranial MR angiography shows wide patency of the vessels with re- cannulization of the right MCA. Good distal vessel flow. Electronically Signed   By: Paulina FusiMark  Shogry M.D.   On: 10/16/2016 17:36   Mr Maxine GlennMra Neck Wo Contrast  Result Date: 10/16/2016 CLINICAL DATA:  Acute onset of left facial droop and left hemiplegia. Followup after right MCA thrombectomy. EXAM: MRI HEAD WITHOUT CONTRAST MRA HEAD WITHOUT CONTRAST MRA NECK WITHOUT CONTRAST TECHNIQUE: Multiplanar, multiecho pulse sequences of the brain and surrounding structures were obtained without intravenous contrast. Angiographic images of the Circle of Willis were obtained using MRA technique without intravenous contrast. Angiographic images of the neck were obtained using MRA technique without intravenous contrast. Carotid stenosis measurements (when applicable) are obtained utilizing NASCET criteria, using the distal internal carotid diameter as the denominator. COMPARISON:  Multiple studies 10/15/2016 FINDINGS: MRI HEAD FINDINGS Brain: The study is degraded by a pronounced artifact related to metal in the region of the mouth or jaw. Diffusion imaging shows acute infarction affecting the right caudate and putamen. There is internal petechial hemorrhage but no frank hematoma. Punctate focus of acute infarction at the deep insula. No other more peripheral acute infarction. Elsewhere, the brain shows chronic small-vessel ischemic changes of the cerebral hemispheric white matter. The area of acute infarction shows mild swelling but there is no midline shift. No extra-axial collection. Vascular: Major vessels at the base of the brain show flow. Skull and upper cervical spine: Negative, with limitations by artifact. Sinuses/Orbits: Some fluid in the paranasal sinuses. Limited by artifact. Other: None MRA HEAD  FINDINGS Both internal carotid arteries are widely patent through the siphon region. No stenosis. The anterior and middle cerebral vessels are patent without proximal stenosis, aneurysm or vascular malformation. Successful recanalization of the right middle cerebral artery. Fetal origin of  the right PCA. Both vertebral arteries are patent to the basilar. No basilar stenosis. Posterior circulation branch vessels are normal. MRA NECK FINDINGS Very limited by metal artifact. Not much more can be sad other than there is antegrade flow an both carotid and vertebral systems. IMPRESSION: Acute infarction affecting the right putamen and caudate. Petechial blood products without frank hematoma. Mild swelling but no shift. Punctate acute infarction in the deep insula. No other more peripheral acute infarction. Neck MR angiography nondiagnostic because of extensive metal artifact. Intracranial MR angiography shows wide patency of the vessels with re- cannulization of the right MCA. Good distal vessel flow. Electronically Signed   By: Paulina Fusi M.D.   On: 10/16/2016 17:36   Mr Brain Wo Contrast  Result Date: 10/16/2016 CLINICAL DATA:  Acute onset of left facial droop and left hemiplegia. Followup after right MCA thrombectomy. EXAM: MRI HEAD WITHOUT CONTRAST MRA HEAD WITHOUT CONTRAST MRA NECK WITHOUT CONTRAST TECHNIQUE: Multiplanar, multiecho pulse sequences of the brain and surrounding structures were obtained without intravenous contrast. Angiographic images of the Circle of Willis were obtained using MRA technique without intravenous contrast. Angiographic images of the neck were obtained using MRA technique without intravenous contrast. Carotid stenosis measurements (when applicable) are obtained utilizing NASCET criteria, using the distal internal carotid diameter as the denominator. COMPARISON:  Multiple studies 10/15/2016 FINDINGS: MRI HEAD FINDINGS Brain: The study is degraded by a pronounced artifact related to  metal in the region of the mouth or jaw. Diffusion imaging shows acute infarction affecting the right caudate and putamen. There is internal petechial hemorrhage but no frank hematoma. Punctate focus of acute infarction at the deep insula. No other more peripheral acute infarction. Elsewhere, the brain shows chronic small-vessel ischemic changes of the cerebral hemispheric white matter. The area of acute infarction shows mild swelling but there is no midline shift. No extra-axial collection. Vascular: Major vessels at the base of the brain show flow. Skull and upper cervical spine: Negative, with limitations by artifact. Sinuses/Orbits: Some fluid in the paranasal sinuses. Limited by artifact. Other: None MRA HEAD FINDINGS Both internal carotid arteries are widely patent through the siphon region. No stenosis. The anterior and middle cerebral vessels are patent without proximal stenosis, aneurysm or vascular malformation. Successful recanalization of the right middle cerebral artery. Fetal origin of the right PCA. Both vertebral arteries are patent to the basilar. No basilar stenosis. Posterior circulation branch vessels are normal. MRA NECK FINDINGS Very limited by metal artifact. Not much more can be sad other than there is antegrade flow an both carotid and vertebral systems. IMPRESSION: Acute infarction affecting the right putamen and caudate. Petechial blood products without frank hematoma. Mild swelling but no shift. Punctate acute infarction in the deep insula. No other more peripheral acute infarction. Neck MR angiography nondiagnostic because of extensive metal artifact. Intracranial MR angiography shows wide patency of the vessels with re- cannulization of the right MCA. Good distal vessel flow. Electronically Signed   By: Paulina Fusi M.D.   On: 10/16/2016 17:36   Dg Chest Port 1 View  Result Date: 10/17/2016 CLINICAL DATA:  76 year old female who presented with right MCA emergent large vessel  occlusion. Status post Neuro-endovascular reperfusion. Right basal ganglia infarction. Respiratory failure. Initial encounter. EXAM: PORTABLE CHEST 1 VIEW COMPARISON:  10/16/2016 and earlier. FINDINGS: Portable AP semi upright view at 0654 hours. Mildly rotated to the left. Endotracheal tube tip in good position between the level the clavicles and carina. Enteric tube courses to the abdomen with  side hole to level of the gastric body. Mildly improved lung volumes since yesterday. Stable cardiac size and mediastinal contours. No confluent pulmonary opacity. No pneumothorax, pulmonary edema, or pleural effusion. IMPRESSION: 1.  Stable lines and tubes. 2. Mildly improved lung volumes since yesterday who with no convincing acute cardiopulmonary abnormality. Electronically Signed   By: Odessa Fleming M.D.   On: 10/17/2016 07:55   Dg Chest Port 1 View  Result Date: 10/16/2016 CLINICAL DATA:  Ventilator EXAM: PORTABLE CHEST 1 VIEW COMPARISON:  10/15/2016 FINDINGS: Endotracheal tube is 3 cm above the carina. NG tube is in the stomach. Bilateral lower lobe airspace opacities, slightly increased. No visible effusions or acute bony abnormality. Heart is borderline in size. IMPRESSION: Increasing bibasilar atelectasis or infiltrates. Electronically Signed   By: Charlett Nose M.D.   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  Records reviewed and summated above. Stroke: Continue secondary stroke prophylaxis and Risk Factor Modification listed below:   Antiplatelet therapy:   Blood Pressure Management:  Continue current medication with prn's with permisive HTN per primary team Statin Agent:   Diabetes management:   Left sided hemiparesis: fit for orthosis to prevent contractures  Motor recovery: Fluoxetine  1. Does the need for close, 24 hr/day medical supervision in concert with the patient's rehab needs make it  unreasonable for this patient to be served in a less intensive setting? Yes  2. Co-Morbidities requiring supervision/potential complications: atrial fibrillation (monitor HR with increased physical activity), history of falls and SDH, diabetes mellitus (Monitor in accordance with exercise and adjust meds as necessary), reported pain due to headaches (Biofeedback training with therapies to help reduce reliance on opiate pain medications, particularly IV morphine, monitor pain control during therapies, and sedation at rest and titrate to maximum efficacy to ensure participation and gains in therapies), hypokalemia (continue to monitor and replete as necessary), leukocytosis (cont to monitor for signs and symptoms of infection, further workup if indicated), ABLA (transfuse if necessary to ensure appropriate perfusion for increased activity tolerance), hypoalbuminemia (maximize nutrition for overall health and wound healing), dysphagia (advance diet as tolerated) 3. Due to safety, disease management, medication administration, pain management and patient education, does the patient require 24 hr/day rehab nursing? Yes 4. Does the patient require coordinated care of a physician, rehab nurse, PT (1-2 hrs/day, 5 days/week), OT (1-2 hrs/day, 5 days/week) and SLP (1-2 hrs/day, 5 days/week) to address physical and functional deficits in the context of the above medical diagnosis(es)? Yes Addressing deficits in the following areas: balance, endurance, locomotion, strength, transferring, bathing, dressing, toileting, swallowing and psychosocial support 5. Can the patient actively participate in an intensive therapy program of at least 3 hrs of therapy per day at least 5 days per week? Yes 6. The potential for patient to make measurable gains while on inpatient rehab is excellent 7. Anticipated functional outcomes upon discharge from inpatient rehab are modified independent  with PT, modified independent with OT, modified  independent with SLP. 8. Estimated rehab length of stay to reach the above functional goals is: 14-18 days. 9. Does the patient have adequate social supports and living environment to accommodate these discharge functional goals? Yes 10. Anticipated D/C setting: Home 11. Anticipated post D/C treatments: HH therapy and Home excercise program 12. Overall Rehab/Functional Prognosis: good  RECOMMENDATIONS: This patient's condition is appropriate for continued rehabilitative care in the following setting: CIR after completion of medical workup.  Patient has  agreed to participate in recommended program. Yes Note that insurance prior authorization may be required for reimbursement for recommended care.  Comment: Rehab Admissions Coordinator to follow up.  Charlton Amor., PA-C 10/18/2016  Maryla Morrow, MD, [place] [facility]

## 2016-10-18 NOTE — Progress Notes (Signed)
Modified Barium Swallow Progress Note  Patient Details  Name: [name] MRN: [id number] Date of Birth: 03/03/1941  Today's Date: 10/18/2016  Modified Barium Swallow completed.  Full report located under Chart Review in the Imaging Section.  Brief recommendations include the following:  Clinical Impression  Pt presents with a mild-moderate oropharyngeal dysphagia marked by decreased oral control/manipulation with barium retention/spillage left anterior oral cavity; premature bolus loss for all consistencies, to level of pyriforms with liquids; consistent penetration of liquids (thin and nectar-thick), fewer incidents of penetration with honey-thick.  Chin tuck did not improve airway protection.  There was consistent vallecular residue.  There was no observed aspiration, but given presence of pharyngeal residue post-swallow, consistent penetration, and sensory deficits, recommend initiating a ocnservative diet of dysphagia 1, honey-thick liquids.  SLP will follow for safety/diet progression.  Pt's daughter was present for study and assisted with translating results/recs to her mother.    Swallow Evaluation Recommendations       SLP Diet Recommendations: Dysphagia 1 (Puree) solids;Honey thick liquids   Liquid Administration via: Cup   Medication Administration: Crushed with puree   Supervision: Patient able to self feed;Full supervision/cueing for compensatory strategies   Compensations: Slow rate;Small sips/bites   Postural Changes: Seated upright at 90 degrees   Oral Care Recommendations: Oral care BID   Other Recommendations: Order thickener from pharmacy    Blenda Mountsouture, [name] [name] 10/18/2016,2:19 PM

## 2016-10-18 NOTE — Progress Notes (Signed)
STROKE TEAM PROGRESS NOTE   SUBJECTIVE (INTERVAL HISTORY) RN is at bedside. Pt was extubated yesterday and tolerated well. Had HA last night and was treated with morphine. Today pending MBS for diet management. BP and HR under control.     OBJECTIVE Temp:  [97.6 F (36.4 C)-98.8 F (37.1 C)] 97.8 F (36.6 C) (01/02 0800) Pulse Rate:  [82-101] 88 (01/02 0900) Cardiac Rhythm: Atrial fibrillation (01/02 0800) Resp:  [1-25] 17 (01/02 0900) BP: (83-137)/(65-104) 124/80 (01/02 0900) SpO2:  [96 %-100 %] 98 % (01/02 0900)  CBC:   Recent Labs Lab 10/15/16 0946  10/16/16 0356  10/17/16 0828 10/18/16 0424  WBC 6.5  --  8.4  < > 10.0 10.7*  NEUTROABS 4.1  --  6.2  --   --   --   HGB 14.7  < > 11.1*  < > 11.8* 11.2*  HCT 42.9  < > 33.0*  < > 35.0* 34.2*  MCV 92.9  --  91.7  < > 93.3 92.9  PLT 321  --  289  < > 307 293  < > = values in this interval not displayed.  Basic Metabolic Panel:   Recent Labs Lab 10/17/16 0828 10/18/16 0424  NA 142 140  K 3.5 3.4*  CL 111 110  CO2 25 21*  GLUCOSE 128* 100*  BUN 6 7  CREATININE 0.60 0.63  CALCIUM 8.6* 8.3*    Lipid Panel:     Component Value Date/Time   CHOL 128 10/16/2016 0356   TRIG 198 (H) 10/16/2016 0356   HDL 29 (L) 10/16/2016 0356   CHOLHDL 4.4 10/16/2016 0356   VLDL 40 10/16/2016 0356   LDLCALC 59 10/16/2016 0356   HgbA1c:  Lab Results  Component Value Date   HGBA1C 6.1 (H) 10/16/2016   Urine Drug Screen: No results found for: LABOPIA, COCAINSCRNUR, LABBENZ, AMPHETMU, THCU, LABBARB    IMAGING I have personally reviewed the radiological images below and agree with the radiology interpretations.  Ct Head Code Stroke W/o Cm 10/15/2016 1. Positive for hyperdense right MCA bifurcation highly suspicious for emergent large vessel occlusion in this clinical setting. No intracranial hemorrhage or changes of acute cortically based infarct  2. ASPECTS is 10.   Ct Cerebral Perfusion W Contrast 10/15/2016 Positive for  emergent large vessel occlusion at the distal right M1 or right MCA bifurcation. Core infarct volume estimated at 17 mL, with large area of right MCA territory penumbra (estimated at 160 mL). Unfortunately due to a scanner malfunction the planned CTA head and neck images were not acquired following this CT perfusion source images.   Cerebral Angiogram  10/15/2016 S/P RT common carotid arteriogram,followed by complete revascularization of occluded RT MCA M1 segment with x1 pass with the solitaireFR 4mmx 40 mm retrieval device and 1.8 mg of superselective intracranial IA Integrelin with achievement of a TICI 3 reperfusion  Ct Head Wo Contrast 10/15/2016 Contrast staining of the basal ganglia on the right, typically seen following intervention. This does not necessarily indicate infarction of these structures. More peripheral right MCA territory appears normal with gray-white differentiation preservation and no swelling or shift.   Mr Brain Wo Contrast 10/16/2016 Acute infarction affecting the right putamen and caudate. Petechial blood products without frank hematoma. Mild swelling but no shift. Punctate acute infarction in the deep insula. No other more peripheral acute infarction.   Mr Shirlee LatchMra Head Wo Contrast 10/16/2016 Intracranial MR angiography shows wide patency of the vessels with re- cannulization of the right MCA. Good  distal vessel flow.   Mr Maxine Glenn Neck Wo Contrast 10/16/2016 Neck MR angiography nondiagnostic because of extensive metal artifact.   Dg Chest Port 1 View 10/17/2016  1.  Stable lines and tubes. 2. Mildly improved lung volumes since yesterday who with no convincing acute cardiopulmonary abnormality.  10/16/2016  Increasing bibasilar atelectasis or infiltrates.  10/15/2016  Endotracheal and nasogastric tubes in grossly good position. Mild bibasilar subsegmental atelectasis. Aortic atherosclerosis.   CUS pending  TTE Left ventricle: The cavity size was normal. There was  moderate   concentric hypertrophy. Systolic function was vigorous. The   estimated ejection fraction was in the range of 65% to 70%. Wall   motion was normal; there were no regional wall motion   abnormalities. - Aortic valve: Valve mobility was trivially restricted.   Transvalvular velocity was increased. There was mild stenosis.   Valve area (VTI): 0.86 cm^2. Valve area (Vmax): 1.05 cm^2. Valve   area (Vmean): 0.89 cm^2. - Mitral valve: Moderately calcified annulus. There was mild   regurgitation. - Left atrium: The atrium was moderately dilated. - Right atrium: The atrium was mildly dilated. - Pulmonary arteries: Systolic pressure was mildly to moderately   increased. PA peak pressure: 41 mm Hg (S). Impressions: - No cardiac source of embolism was identified, but cannot be ruled   out on the basis of this examination.   PHYSICAL EXAM  Temp:  [97.6 F (36.4 C)-98.8 F (37.1 C)] 97.8 F (36.6 C) (01/02 0800) Pulse Rate:  [82-101] 88 (01/02 0900) Resp:  [1-25] 17 (01/02 0900) BP: (83-137)/(65-104) 124/80 (01/02 0900) SpO2:  [96 %-100 %] 98 % (01/02 0900)  General - Well nourished, well developed, in no apparent distress.  Ophthalmologic - Fundi not visualized due to noncooperation.  Cardiovascular - irregularly irregular heart rate and rhythm.  Mental Status -  Level of arousal and orientation to time, place, and person were intact. Language including expression, naming, repetition, comprehension was assessed and found intact, however, moderate dysarthria and hypophonia.  Cranial Nerves II - XII - II - Visual field intact OU. III, IV, VI - Extraocular movements intact. V - Facial sensation decreased on the left. VII - Facial movement intact bilaterally. VIII - Hearing & vestibular intact bilaterally. X - Palate elevates symmetrically, moderate dysarthria XI - Chin turning & shoulder shrug intact bilaterally. XII - Tongue protrusion intact.  Motor Strength - The  patient's strength was normal in all extremities except LUE proximal 4/5 and left hand decreased dexterity and pronator drift was present.  Bulk was normal and fasciculations were absent.   Motor Tone - Muscle tone was assessed at the neck and appendages and was normal.  Reflexes - The patient's reflexes were 1+ in all extremities and she had no pathological reflexes.  Sensory - Light touch, temperature/pinprick were assessed and were decreased on the left LE.    Coordination - The patient had normal movements in the hand with no ataxia or dysmetria.  Tremor was absent.  Gait and Station - not tested.   ASSESSMENT/PLAN [name] is a 76 y.o. female with history of diabetes mellitus, subdural hematoma in 2015, previous pneumonia, obstructive sleep apnea on C Pap, dyslipidemia, and atrial fibrillation not anticoagulated secondary to subdural hematoma history presenting with left facial droop, left hemiplegia, and right gaze deviation.  She did not receive IV t-PA due to prior history of subdural hematoma.   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 Infarcts felt to be embolic secondary to known atrial fibrillation not on anticoagulation  Resultant  Left mild hemiparesis  Code Stroke CT hyperdense R MCA. ASPECTS 10   CT Perfusion distal R M1/R MCA bifurcation emergent large vessel occlusion. 17mL core infarct volume with large penumbra (160 mL)  Cerebral Angio TICI3 revascularization R MCA M1   Post IR CT R BG contrast staining post IR  MRI R putamen/caudate infarct with patchy hemorrhagic transformation, R deep insular infarct.  MRA head patent with recannulization R MCA. Left PCA stenosis  MRA neck non diagnostic d/t metal artifact  CUS pending  2D Echo EF 65-70%  LDL - 59  HgbA1c 6.1  VTE prophylaxis - Lovenox Diet NPO time specified  aspirin 81 mg daily prior to admission, now on aspirin  300 mg suppository daily. Consider eliquis 5mg  bid 14 days after for stroke prevention due to hemorrhagic transformation. Pt daughter leaning towards anticoagulation at this time.   Ongoing aggressive stroke risk factor management  Therapy recommendations: pending  Disposition: Pending  Hx of small traumatic SDH  Post fall 04/2014  Small amount of SDH at left parietal and no need surgical intervention  Atrial Fibrillation  Home anticoagulation:  none given hx SDH  Pt CHA2DS2-VASc score = 6  HAS BLED score = 4  Pt daughter leaning towards anticoagulation at this time.    Hypertension  SBP goal 100 - 180  BP stable  Off neo now  Long-term BP goal normotensive  Hyperlipidemia  Home meds:  Pravachol 10 mg   LDL 59, goal < 70  Resume Pravachol when PO access is available.  Continue statin at discharge  Diabetes  HgbA1c 6.1, goal < 7.0  Glucoses stable  CBG monitoring  SSI  Other Stroke Risk Factors  Advanced age  Obstructive sleep apnea, on CPAP at home  Other Active Problems    Hospital day # 3  This patient is critically ill due to right MCA infarct with hemorrhagic transformation, A. fib and at significant risk of neurological worsening, death form recurrent stroke, hemorrhagic transformation, cerebral edema, brain herniation, heart failure. This patient's care requires constant monitoring of vital signs, hemodynamics, respiratory and cardiac monitoring, review of multiple databases, neurological assessment, discussion with family, other specialists and medical decision making of high complexity. I spent 40 minutes of neurocritical care time in the care of this patient.  Marvel Plan, MD PhD Stroke Neurology 10/18/2016 9:56 AM   To contact Stroke Continuity provider, please refer to [email or web address]. After hours, contact General Neurology

## 2016-10-18 NOTE — Progress Notes (Signed)
Physical Therapy Treatment Patient Details Name: [name] MRN: [id number] DOB: Nov 11, 1940 Today's Date: 10/18/2016    History of Present Illness 76 year old female w/ sig h/o Afib, type II DM. Was not on anticoagulation d/t h/o falls and SDH. Was in usual state of health until 12/30 am. Per family she was feeling "tired". Went to bed at 0900. At 0930 found by family w/ left sided weakness and facial droop. Transferred to [facility] ER. Code stroke initiated. CT head c/w: hyperdense right MCA bifurcation highly suspicious    PT Comments    Patient progressing well towards PT goals. Demonstrates left inattention, left sided weakness and imbalance. Tolerated gait training with Min A and max cues for step through gait pattern and balance. Educated daughter on encouraging pt to use LUE functionally and to practice having her attend to left side. Pt very motivated to return to PLOF. Great CIR candidate. Will follow.   Follow Up Recommendations  CIR;Supervision - Intermittent     Equipment Recommendations  Other (comment)    Recommendations for Other Services       Precautions / Restrictions Precautions Precautions: Fall Restrictions Weight Bearing Restrictions: No    Mobility  Bed Mobility Overal bed mobility: Needs Assistance Bed Mobility: Supine to Sit     Supine to sit: Min assist;HOB elevated     General bed mobility comments: Assist for moving LEs off bed, use of rail. Increased time.   Transfers Overall transfer level: Needs assistance Equipment used: 1 person hand held assist Transfers: Sit to/from Stand Sit to Stand: Min assist         General transfer comment: Assist to steady in standing. Mild posterior lean noted.   Ambulation/Gait Ambulation/Gait assistance: Min assist;+2 safety/equipment Ambulation Distance (Feet): 50 Feet (+50') Assistive device: 1 person hand held assist Gait Pattern/deviations: Step-to pattern;Decreased step length - right;Decreased step  length - left;Decreased stride length;Shuffle;Drifts right/left;Trunk flexed;Wide base of support Gait velocity: decreased   General Gait Details: Short shuffling steps noted initially but with cues, able to increase step length LLE but when fatigued reverts back to short step lengths. Pt with forward momentum requiring assist to prevent LOB anteriorly. Constant Min A for balance due to unsteadiness and drifting.    Stairs            Wheelchair Mobility    Modified Rankin (Stroke Patients Only) Modified Rankin (Stroke Patients Only) Pre-Morbid Rankin Score: No significant disability Modified Rankin: Moderately severe disability     Balance Overall balance assessment: Needs assistance;History of Falls Sitting-balance support: Feet supported;No upper extremity supported Sitting balance-Leahy Scale: Fair Sitting balance - Comments: Able to pull up sock sitting EOB without LOB. Practiced using LUE for fine motor tasks and functional tasks- openin/closing containers, tying bows.   Standing balance support: During functional activity;Single extremity supported Standing balance-Leahy Scale: Poor Standing balance comment: relies on at least 1 UE support for balance.                     Cognition Arousal/Alertness: Awake/alert Behavior During Therapy: Flat affect Overall Cognitive Status: Within Functional Limits for tasks assessed                 General Comments: Pt with left inattention but seems improved from prior session. Difficult to fully assess due to language barrier.     Exercises      General Comments General comments (skin integrity, edema, etc.): Daughter present during session and interpreted.  Pertinent Vitals/Pain Pain Assessment: No/denies pain    Home Living                      Prior Function            PT Goals (current goals can now be found in the care plan section) Progress towards PT goals: Progressing toward  goals    Frequency    Min 4X/week      PT Plan Current plan remains appropriate    Co-evaluation             End of Session Equipment Utilized During Treatment: Gait belt Activity Tolerance: Patient tolerated treatment well Patient left: in chair;with call bell/phone within reach;with chair alarm set;with family/visitor present     Time: 1003-1030 PT Time Calculation (min) (ACUTE ONLY): 27 min  Charges:  $Gait Training: 8-22 mins $Therapeutic Activity: 8-22 mins                    G Codes:      Brodan Grewell A Nakoma Gotwalt 10/18/2016, 11:06 AM  Mylo RedShauna Socorro Kanitz, PT, DPT [phone]

## 2016-10-18 NOTE — Progress Notes (Signed)
PULMONARY / CRITICAL CARE MEDICINE   Name: [name] MRN: [id number] DOB: Oct 05, 1941    ADMISSION DATE:  10/15/2016 CONSULTATION DATE:  12/30  REFERRING MD:  Otelia Limes   CHIEF COMPLAINT:  Ventilator management and critical care services    SUBJECTIVE:  Looks great   VITAL SIGNS: BP 124/80   Pulse 88   Temp 97.8 F (36.6 C) (Oral)   Resp 17   Ht 4\' 10"  (1.473 m)   Wt 136 lb 0.4 oz (61.7 kg)   SpO2 98%   BMI 28.43 kg/m  Room air     INTAKE / OUTPUT: I/O last 3 completed shifts: In: 1380.3 [I.V.:1320.3; NG/GT:60] Out: 2125 [Urine:2125]   PHYSICAL EXAMINATION: General:  Well nourished female, no distress.  Neuro:  Awake, able to ambulate. Some residual left sided weakness. Does not sp english but oriented.  HEENT:  NCAT. No JVD.  Cardiovascular:  Regular irreg II/VI SEM  Lungs:  Clear w/out accessory use  Abdomen:  Soft, not tender + bowel sounds  Musculoskeletal:  No deformities Skin:  Warm and dry   LABS:  BMET  Recent Labs Lab 10/16/16 0356 10/17/16 0828 10/18/16 0424  NA 139 142 140  K 3.5 3.5 3.4*  CL 111 111 110  CO2 19* 25 21*  BUN 9 6 7   CREATININE 0.58 0.60 0.63  GLUCOSE 125* 128* 100*    Electrolytes  Recent Labs Lab 10/16/16 0356 10/17/16 0828 10/18/16 0424  CALCIUM 8.4* 8.6* 8.3*    CBC  Recent Labs Lab 10/16/16 1321 10/17/16 0828 10/18/16 0424  WBC 10.0 10.0 10.7*  HGB 11.9* 11.8* 11.2*  HCT 36.0 35.0* 34.2*  PLT 272 307 293    Coag's  Recent Labs Lab 10/15/16 0946  APTT 43*  INR 0.95    Sepsis Markers No results for input(s): LATICACIDVEN, PROCALCITON, O2SATVEN in the last 168 hours.  ABG  Recent Labs Lab 10/15/16 1618  PHART 7.434  PCO2ART 35.1  PO2ART 339.0*    Liver Enzymes  Recent Labs Lab 10/15/16 0946 10/18/16 0424  AST 22 14*  ALT 16 12*  ALKPHOS 42 34*  BILITOT 0.6 0.9  ALBUMIN 4.0 3.2*    Cardiac Enzymes No results for input(s): TROPONINI, PROBNP in the last 168  hours.  Glucose  Recent Labs Lab 10/15/16 0945 10/15/16 2028 10/17/16 2102 10/18/16 0814  GLUCAP 134* 122* 97 90    Imaging No results found.   STUDIES:  CT head 12/30: 1. Positive for hyperdense right MCA bifurcation highly suspicious for emergent large vessel occlusion in this clinical setting. No intracranial hemorrhage or changes of acute cortically based infarct CT head w/ perfusion 12/30: Positive for emergent large vessel occlusion at the distal right M1 or right MCA bifurcation.Core infarct volume estimated at 17 mL, with large area of right MCA territory penumbra (estimated at 160 mL).  CULTURES:  ANTIBIOTICS:   SIGNIFICANT EVENTS: 12/30 admitted to ED. Large right MCA CVA. To IR for revascularization   LINES/TUBES: ETT 12/30 >> 1/1 DISCUSSION: 76 year old female admitted 12/30 w/ large right MCA CVA w/ associated left sided weakness. Went to IR where she underwent successful clot retrieval and re-vascularization. Returned to ICU in vent.  For today.  Cont PT/OT For CIR when able PCCM will s/o  ASSESSMENT / PLAN:  NEUROLOGIC A:   Acute Right MCA stroke -->likely embolic w/ h/o Afib  -remote h/o SDH P:   Cont allied health services Would benefit from CIR  CARDIOVASCULAR A:  Afib.  P:  Goal SBP 120-140 Cont tele   RENAL A:   Relative oliguria  P:   Strict I&O Avoid hypotension  F/u chemistry  GASTROINTESTINAL A:   NPO status  At risk for dysphagia  P:   Awaiting swallow eval    HEMATOLOGIC A:   No acute  P:  Trend CBC  Pas to LEs   INFECTIOUS A:   No acute  P:   Trend fever and WBC curve   ENDOCRINE A:   Mild hyperglycemia  P:   ssi    FAMILY  - Updates: updated family at bedside 12/31  PCCM will sign off.   Simonne MartinetPeter E Babcock ACNP-BC [facility] Pager # [phone] OR # [phone] if no answer   ATTENDING NOTE / ATTESTATION NOTE :   I have discussed the case with the resident/APP  Anders SimmondsPete Babcock.    I agree with the resident/APP's  history, physical examination, assessment, and plans.    I have edited the above note and modified it according to our agreed history, physical examination, assessment and plan.   76 year old female admitted 12/30 w/ large right MCA CVA w/ associated left sided weakness. Went to IR where she underwent successful clot retrieval and re-vascularization. Returned to ICU in vent. Pt has been extubated and is doing well.   Pt seen and examined.  Awake, follows commands, NAD. Doing IS.  Weak on the L side of body.  Fair ae. CTA. Good s1/s2. (-) m/r/g.  (+) BS, soft, NT. (-) edema.   Assessment/Plan S/P Clot retrieval and revascularization for large R MCA CVA. Pt will be transferred to telemetry today.  CVA management per primary team.  PCCM will sign off.  Cont IS.    Family :Family updated at length today.  Spoke to daughter today.    Pollie MeyerJ. Angelo A de Dios, MD 10/18/2016, 1:32 PM [facility] Pager [phone] After 3 pm or if no answer, call [phone]

## 2016-10-18 NOTE — Progress Notes (Signed)
Inpatient Rehabilitation  IP Rehab consult has been completed with recommendation for IP rehab.  Admissions coordinator to follow up tomorrow to discuss with pt. and family.  Please call if questions.  Weldon PickingSusan Yojan Paskett PT Inpatient Rehab Admissions Coordinator Cell [phone] Office [phone]

## 2016-10-18 NOTE — Progress Notes (Signed)
*  PRELIMINARY RESULTS* Vascular Ultrasound Carotid Duplex (Doppler) has been completed. Findings suggest 1-39% internal carotid artery stenosis bilaterally. Vertebral arteries are patent with antegrade flow.  10/18/2016 4:32 PM Gertie FeyMichelle Cipriana Biller, BS, RVT, RDCS, RDMS

## 2016-10-18 NOTE — Progress Notes (Signed)
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 Authorizing Provider  albuterol (ACCUNEB) 0.63 MG/3ML nebulizer solution Take 0.63 mg by nebulization every 6 (six) hours as needed. When unable to use inaler 07/12/16  Yes Historical Provider, MD  alendronate (FOSAMAX) 70 MG tablet Take 70 mg by mouth every Monday.  04/30/14  Yes Historical Provider, MD  aspirin EC 81 MG tablet Take 81 mg by mouth daily.   Yes Historical Provider, MD  azithromycin (ZITHROMAX) 250 MG tablet Take 250-500 mg by mouth See admin instructions. Pt takes 500mg  on first day 12/28, then 250mg  daily therafter 10/12/16  Yes Historical Provider, MD  b complex vitamins tablet Take 1 tablet by mouth daily.   Yes Historical Provider, MD  Calcium-Vitamin D (CALTRATE 600 PLUS-VIT D PO) Take 1 tablet by mouth 2 (two) times daily.   Yes Historical Provider, MD  chlorpheniramine-HYDROcodone (TUSSIONEX PENNKINETIC ER) 10-8 MG/5ML LQCR Take 5 mLs by mouth every 12 (twelve) hours as needed for cough. Patient taking differently: Take 2.5-5 mLs by mouth every 12 (twelve) hours as needed for cough.  11/11/14  Yes Courtney Paris, MD  fluticasone (FLONASE) 50 MCG/ACT nasal spray Place 1 spray into both nostrils daily as needed for allergies.  02/18/14  Yes Historical Provider, MD  fluticasone (FLOVENT HFA) 110  MCG/ACT inhaler Inhale 1 puff into the lungs 2 (two) times daily. 05/16/16  Yes Historical Provider, MD  HYDROcodone-acetaminophen (NORCO/VICODIN) 5-325 MG per tablet Take 1 tablet by mouth every 6 (six) hours as needed for severe pain. 11/11/14  Yes Carolan Clines, MD  Krill Oil 1000 MG CAPS Take 1,000 mg by mouth 2 (two) times daily.    Yes Historical Provider, MD  levalbuterol (XOPENEX HFA) 45 MCG/ACT inhaler Inhale 1 puff into the lungs every 4 (four) hours as needed. 04/22/15  Yes Historical Provider, MD  metFORMIN (GLUCOPHAGE-XR) 500 MG 24 hr tablet Take 500 mg by mouth 2 (two) times daily.  04/30/14  Yes Historical Provider, MD  metoprolol tartrate (LOPRESSOR) 25 MG tablet Take 12.5 mg by mouth 2 (two) times daily. 09/22/16  Yes Historical Provider, MD  montelukast (SINGULAIR) 10 MG tablet Take 10 mg by mouth at bedtime.  08/15/14  Yes Historical Provider, MD  Multiple Vitamin (DAILY MULTIVITAMIN PO) Take 1 tablet by mouth daily.   Yes Historical Provider, MD  pravastatin (PRAVACHOL) 10 MG tablet Take 10 mg by mouth daily. 04/30/14  Yes Historical Provider, MD  traMADol (ULTRAM) 50 MG tablet Take 1 tablet (50 mg total) by mouth 2 (two) times daily. Patient taking differently: Take 50 mg by mouth 2 (two) times daily as needed.  05/14/14  Yes Russella Dar, NP  albuterol (PROVENTIL HFA;VENTOLIN HFA) 108 (90 BASE) MCG/ACT inhaler Inhale 2 puffs into the lungs every 6 (six) hours as needed for wheezing or shortness of breath. Patient not taking: Reported on 10/16/2016 11/11/14  Courtney ParisEden W Jones, MD  bacitracin ointment Apply 1 application topically 2 (two) times daily. Patient not taking: Reported on 10/16/2016 05/14/14   Russella DarAllison L Ellis, NP  docusate sodium 100 MG CAPS Take 100 mg by mouth 2 (two) times daily. Patient not taking: Reported on 10/16/2016 05/14/14   Russella DarAllison L Ellis, NP  levofloxacin (LEVAQUIN) 750 MG tablet Take 1 tablet (750 mg total) by mouth daily. Patient not taking: Reported on 10/16/2016  11/11/14   Carolan ClinesNora M Sadek, MD     Vital Signs: BP 120/71   Pulse 91   Temp 97.8 F (36.6 C) (Oral)   Resp 18   Ht 4\' 10"  (1.473 m)   Wt 136 lb 0.4 oz (61.7 kg)   SpO2 98%   BMI 28.43 kg/m   Physical Exam  Cardiovascular: Normal rate.   Pulmonary/Chest: Effort normal.  Abdominal: Soft.  Musculoskeletal: Normal range of motion.  Moving all 4s Follows all commands Walking to bathroom- with little help  Left side sl weaker  Neurological: She is alert.  Skin: Skin is warm and dry.  Left groin NT No bleeding No hematoma   Psychiatric: She has a normal mood and affect. Her behavior is normal.  Nursing note and vitals reviewed.   Imaging: Ct Head Wo Contrast  Result Date: 10/15/2016 CLINICAL DATA:  Followup intervention for right M1 occlusion EXAM: CT HEAD WITHOUT CONTRAST TECHNIQUE: Contiguous axial images were obtained from the base of the skull through the vertex without intravenous contrast. COMPARISON:  Multiple exams earlier same day. FINDINGS: Brain: Diffuse hyperdensity of the right caudate and putamen probably secondary to contrast staining. No density suggestive of hemorrhage. Maintenance of gray-white differentiation in the peripheral right middle cerebral artery territory. No swelling or mass effect. No hydrocephalus or extra-axial collection. Vascular: No asymmetric vessel hyperdensity. Skull: Negative Sinuses/Orbits: Mucosal thickening throughout the paranasal sinuses as seen previously. Orbits negative. Other: None IMPRESSION: Contrast staining of the basal ganglia on the right, typically seen following intervention. This does not necessarily indicate infarction of these structures. More peripheral right MCA territory appears normal with gray-white differentiation preservation and no swelling or shift. Electronically Signed   By: Paulina FusiMark  Shogry M.D.   On: 10/15/2016 14:35   Mr Shirlee LatchMra Head NWWo Contrast  Result Date: 10/16/2016 CLINICAL DATA:  Acute onset of left facial droop  and left hemiplegia. Followup after right MCA thrombectomy. EXAM: MRI HEAD WITHOUT CONTRAST MRA HEAD WITHOUT CONTRAST MRA NECK WITHOUT CONTRAST TECHNIQUE: Multiplanar, multiecho pulse sequences of the brain and surrounding structures were obtained without intravenous contrast. Angiographic images of the Circle of Willis were obtained using MRA technique without intravenous contrast. Angiographic images of the neck were obtained using MRA technique without intravenous contrast. Carotid stenosis measurements (when applicable) are obtained utilizing NASCET criteria, using the distal internal carotid diameter as the denominator. COMPARISON:  Multiple studies 10/15/2016 FINDINGS: MRI HEAD FINDINGS Brain: The study is degraded by a pronounced artifact related to metal in the region of the mouth or jaw. Diffusion imaging shows acute infarction affecting the right caudate and putamen. There is internal petechial hemorrhage but no frank hematoma. Punctate focus of acute infarction at the deep insula. No other more peripheral acute infarction. Elsewhere, the brain shows chronic small-vessel ischemic changes of the cerebral hemispheric white matter. The area of acute infarction shows mild swelling but there is no midline shift. No extra-axial collection. Vascular: Major vessels at the base of the brain show flow. Skull and upper cervical spine: Negative, with limitations by  artifact. Sinuses/Orbits: Some fluid in the paranasal sinuses. Limited by artifact. Other: None MRA HEAD FINDINGS Both internal carotid arteries are widely patent through the siphon region. No stenosis. The anterior and middle cerebral vessels are patent without proximal stenosis, aneurysm or vascular malformation. Successful recanalization of the right middle cerebral artery. Fetal origin of the right PCA. Both vertebral arteries are patent to the basilar. No basilar stenosis. Posterior circulation branch vessels are normal. MRA NECK FINDINGS Very limited  by metal artifact. Not much more can be sad other than there is antegrade flow an both carotid and vertebral systems. IMPRESSION: Acute infarction affecting the right putamen and caudate. Petechial blood products without frank hematoma. Mild swelling but no shift. Punctate acute infarction in the deep insula. No other more peripheral acute infarction. Neck MR angiography nondiagnostic because of extensive metal artifact. Intracranial MR angiography shows wide patency of the vessels with re- cannulization of the right MCA. Good distal vessel flow. Electronically Signed   By: Paulina Fusi M.D.   On: 10/16/2016 17:36   Mr Maxine Glenn Neck Wo Contrast  Result Date: 10/16/2016 CLINICAL DATA:  Acute onset of left facial droop and left hemiplegia. Followup after right MCA thrombectomy. EXAM: MRI HEAD WITHOUT CONTRAST MRA HEAD WITHOUT CONTRAST MRA NECK WITHOUT CONTRAST TECHNIQUE: Multiplanar, multiecho pulse sequences of the brain and surrounding structures were obtained without intravenous contrast. Angiographic images of the Circle of Willis were obtained using MRA technique without intravenous contrast. Angiographic images of the neck were obtained using MRA technique without intravenous contrast. Carotid stenosis measurements (when applicable) are obtained utilizing NASCET criteria, using the distal internal carotid diameter as the denominator. COMPARISON:  Multiple studies 10/15/2016 FINDINGS: MRI HEAD FINDINGS Brain: The study is degraded by a pronounced artifact related to metal in the region of the mouth or jaw. Diffusion imaging shows acute infarction affecting the right caudate and putamen. There is internal petechial hemorrhage but no frank hematoma. Punctate focus of acute infarction at the deep insula. No other more peripheral acute infarction. Elsewhere, the brain shows chronic small-vessel ischemic changes of the cerebral hemispheric white matter. The area of acute infarction shows mild swelling but there is no  midline shift. No extra-axial collection. Vascular: Major vessels at the base of the brain show flow. Skull and upper cervical spine: Negative, with limitations by artifact. Sinuses/Orbits: Some fluid in the paranasal sinuses. Limited by artifact. Other: None MRA HEAD FINDINGS Both internal carotid arteries are widely patent through the siphon region. No stenosis. The anterior and middle cerebral vessels are patent without proximal stenosis, aneurysm or vascular malformation. Successful recanalization of the right middle cerebral artery. Fetal origin of the right PCA. Both vertebral arteries are patent to the basilar. No basilar stenosis. Posterior circulation branch vessels are normal. MRA NECK FINDINGS Very limited by metal artifact. Not much more can be sad other than there is antegrade flow an both carotid and vertebral systems. IMPRESSION: Acute infarction affecting the right putamen and caudate. Petechial blood products without frank hematoma. Mild swelling but no shift. Punctate acute infarction in the deep insula. No other more peripheral acute infarction. Neck MR angiography nondiagnostic because of extensive metal artifact. Intracranial MR angiography shows wide patency of the vessels with re- cannulization of the right MCA. Good distal vessel flow. Electronically Signed   By: Paulina Fusi M.D.   On: 10/16/2016 17:36   Mr Brain Wo Contrast  Result Date: 10/16/2016 CLINICAL DATA:  Acute onset of left facial droop and left hemiplegia. Followup after right  MCA thrombectomy. EXAM: MRI HEAD WITHOUT CONTRAST MRA HEAD WITHOUT CONTRAST MRA NECK WITHOUT CONTRAST TECHNIQUE: Multiplanar, multiecho pulse sequences of the brain and surrounding structures were obtained without intravenous contrast. Angiographic images of the Circle of Willis were obtained using MRA technique without intravenous contrast. Angiographic images of the neck were obtained using MRA technique without intravenous contrast. Carotid stenosis  measurements (when applicable) are obtained utilizing NASCET criteria, using the distal internal carotid diameter as the denominator. COMPARISON:  Multiple studies 10/15/2016 FINDINGS: MRI HEAD FINDINGS Brain: The study is degraded by a pronounced artifact related to metal in the region of the mouth or jaw. Diffusion imaging shows acute infarction affecting the right caudate and putamen. There is internal petechial hemorrhage but no frank hematoma. Punctate focus of acute infarction at the deep insula. No other more peripheral acute infarction. Elsewhere, the brain shows chronic small-vessel ischemic changes of the cerebral hemispheric white matter. The area of acute infarction shows mild swelling but there is no midline shift. No extra-axial collection. Vascular: Major vessels at the base of the brain show flow. Skull and upper cervical spine: Negative, with limitations by artifact. Sinuses/Orbits: Some fluid in the paranasal sinuses. Limited by artifact. Other: None MRA HEAD FINDINGS Both internal carotid arteries are widely patent through the siphon region. No stenosis. The anterior and middle cerebral vessels are patent without proximal stenosis, aneurysm or vascular malformation. Successful recanalization of the right middle cerebral artery. Fetal origin of the right PCA. Both vertebral arteries are patent to the basilar. No basilar stenosis. Posterior circulation branch vessels are normal. MRA NECK FINDINGS Very limited by metal artifact. Not much more can be sad other than there is antegrade flow an both carotid and vertebral systems. IMPRESSION: Acute infarction affecting the right putamen and caudate. Petechial blood products without frank hematoma. Mild swelling but no shift. Punctate acute infarction in the deep insula. No other more peripheral acute infarction. Neck MR angiography nondiagnostic because of extensive metal artifact. Intracranial MR angiography shows wide patency of the vessels with re-  cannulization of the right MCA. Good distal vessel flow. Electronically Signed   By: Paulina Fusi M.D.   On: 10/16/2016 17:36   Ct Cerebral Perfusion W Contrast  Result Date: 10/15/2016 CLINICAL DATA:  Code stroke. 76 year old female with left side weakness and right gaze deviation onset 0930 hours. Initial encounter. EXAM: CT PERFUSION BRAIN TECHNIQUE: Multiphase CT imaging of the brain was performed following IV bolus contrast injection. Subsequent parametric perfusion maps were calculated using RAPID software. CONTRAST:  50 mL Isovue 370 COMPARISON:  Noncontrast head CT 952 hours today. FINDINGS: CT Brain Perfusion Findings: CBF (<30%) Volume: 17mL Perfusion (Tmax>6.0s) volume: Mismatch Volume: Infarction Location:Central white matter of the right MCA territory. Other findings: Perfusion source images are positive for distal right M1 or right bifurcation level occlusion. See series 21 images 183, 184, and 198. IMPRESSION: Positive for emergent large vessel occlusion at the distal right M1 or right MCA bifurcation. Core infarct volume estimated at 17 mL, with large area of right MCA territory penumbra (estimated at 160 mL). Unfortunately due to a scanner malfunction the planned CTA head and neck images were not acquired following this CT perfusion source images. However, I discussed the above findings by telephone with Dr. Agnes Lawrence on 10/15/2016 at 1027 hours, and we agree that the diagnostic information at hand is such that emergent Endovascular Neurointervention for reperfusion such proceed. Electronically Signed   By: Odessa Fleming M.D.   On: 10/15/2016 10:39  Dg Chest Port 1 View  Result Date: 10/17/2016 CLINICAL DATA:  76 year old female who presented with right MCA emergent large vessel occlusion. Status post Neuro-endovascular reperfusion. Right basal ganglia infarction. Respiratory failure. Initial encounter. EXAM: PORTABLE CHEST 1 VIEW COMPARISON:  10/16/2016 and earlier. FINDINGS:  Portable AP semi upright view at 0654 hours. Mildly rotated to the left. Endotracheal tube tip in good position between the level the clavicles and carina. Enteric tube courses to the abdomen with side hole to level of the gastric body. Mildly improved lung volumes since yesterday. Stable cardiac size and mediastinal contours. No confluent pulmonary opacity. No pneumothorax, pulmonary edema, or pleural effusion. IMPRESSION: 1.  Stable lines and tubes. 2. Mildly improved lung volumes since yesterday who with no convincing acute cardiopulmonary abnormality. Electronically Signed   By: Odessa Fleming M.D.   On: 10/17/2016 07:55   Dg Chest Port 1 View  Result Date: 10/16/2016 CLINICAL DATA:  Ventilator EXAM: PORTABLE CHEST 1 VIEW COMPARISON:  10/15/2016 FINDINGS: Endotracheal tube is 3 cm above the carina. NG tube is in the stomach. Bilateral lower lobe airspace opacities, slightly increased. No visible effusions or acute bony abnormality. Heart is borderline in size. IMPRESSION: Increasing bibasilar atelectasis or infiltrates. Electronically Signed   By: Charlett Nose M.D.   On: 10/16/2016 12:09   Dg Chest Port 1 View  Result Date: 10/15/2016 CLINICAL DATA:  Acute respiratory failure. EXAM: PORTABLE CHEST 1 VIEW COMPARISON:  Radiograph of November 09, 2014. FINDINGS: Stable cardiomediastinal silhouette. Atherosclerosis of thoracic aorta is noted. Distal tip of nasogastric tube is seen in proximal stomach. Endotracheal tube is seen projected over tracheal air shadow with distal tip 2 cm above the carina. No pneumothorax is noted. Mild bibasilar subsegmental atelectasis is noted. Bony thorax is unremarkable. IMPRESSION: Endotracheal and nasogastric tubes in grossly good position. Mild bibasilar subsegmental atelectasis. Aortic atherosclerosis. Electronically Signed   By: Lupita Raider, M.D.   On: 10/15/2016 19:22   Ct Head Code Stroke W/o Cm  Result Date: 10/15/2016 CLINICAL DATA:  Code stroke. 76 year old female  with left side weakness and right gaze deviation onset 0930 hours. Initial encounter. EXAM: CT HEAD WITHOUT CONTRAST TECHNIQUE: Contiguous axial images were obtained from the base of the skull through the vertex without intravenous contrast. COMPARISON:  Head CT without contrast 05/13/2014. FINDINGS: Brain: No midline shift, ventriculomegaly, mass effect, evidence of mass lesion, intracranial hemorrhage or evidence of cortically based acute infarction. Gray-white matter differentiation is within normal limits throughout the brain. Vascular: Positive for hyperdense right MCA bifurcation (series 204, image 22 and compare to the contralateral bifurcation on image 48). Calcified atherosclerosis at the skull base. Skull: No acute osseous abnormality identified. Sinuses/Orbits: Trace paranasal sinus mucosal thickening. Other: Rightward gaze deviation. Otherwise no acute orbit or scalp soft tissue findings. ASPECTS [facility] Stroke Program Early CT Score) - Ganglionic level infarction (caudate, lentiform nuclei, internal capsule, insula, M1-M3 cortex): 7 - Supraganglionic infarction (M4-M6 cortex): 3 Total score (0-10 with 10 being normal): 10 IMPRESSION: 1. Positive for hyperdense right MCA bifurcation highly suspicious for emergent large vessel occlusion in this clinical setting. No intracranial hemorrhage or changes of acute cortically based infarct 2. ASPECTS is 10. 3. The above was relayed via text pager to [facility] Dr. Agnes Lawrence on 10/15/2016 at 1005 hours. I also discussed the findings with him by telephone at 1016 hours. Electronically Signed   By: Odessa Fleming M.D.   On: 10/15/2016 10:30    Labs:  CBC:  Recent Labs  10/16/16  1610 10/16/16 1321 10/17/16 0828 10/18/16 0424  WBC 8.4 10.0 10.0 10.7*  HGB 11.1* 11.9* 11.8* 11.2*  HCT 33.0* 36.0 35.0* 34.2*  PLT 289 272 307 293    COAGS:  Recent Labs  10/15/16 0946  INR 0.95  APTT 43*    BMP:  Recent Labs  10/15/16 0946 10/15/16 0952  10/16/16 0356 10/17/16 0828 10/18/16 0424  NA 143 142 139 142 140  K 3.9 3.8 3.5 3.5 3.4*  CL 104 103 111 111 110  CO2 27  --  19* 25 21*  GLUCOSE 142* 143* 125* 128* 100*  BUN 14 16 9 6 7   CALCIUM 10.4*  --  8.4* 8.6* 8.3*  CREATININE 0.68 0.70 0.58 0.60 0.63  GFRNONAA >60  --  >60 >60 >60  GFRAA >60  --  >60 >60 >60    LIVER FUNCTION TESTS:  Recent Labs  10/15/16 0946 10/18/16 0424  BILITOT 0.6 0.9  AST 22 14*  ALT 16 12*  ALKPHOS 42 34*  PROT 7.4 6.2*  ALBUMIN 4.0 3.2*    Assessment and Plan:  R MCA revasc 12/30 For Inpt rehab soon Follow with Dr Corliss Skains Pt will hear from scheduler for time and date  Electronically Signed: Gerome Kokesh A 10/18/2016, 11:22 AM   I spent a total of 15 Minutes at the the patient's bedside AND on the patient's [name] floor or unit, greater than 50% of which was counseling/coordinating care for R MCA revascularization

## 2016-10-19 ENCOUNTER — Inpatient Hospital Stay (HOSPITAL_COMMUNITY)
Admission: RE | Admit: 2016-10-19 | Discharge: 2016-11-01 | DRG: 092 | Disposition: A | Discharge status: Home Health | Payer: Medicare Other | Source: Intra-hospital | Attending: Physical Medicine & Rehabilitation | Admitting: Physical Medicine & Rehabilitation

## 2016-10-19 ENCOUNTER — Encounter (HOSPITAL_COMMUNITY): Payer: Self-pay | Admitting: Interventional Radiology

## 2016-10-19 DIAGNOSIS — R059 Cough, unspecified: Secondary | ICD-10-CM

## 2016-10-19 DIAGNOSIS — Z961 Presence of intraocular lens: Secondary | ICD-10-CM | POA: Diagnosis present

## 2016-10-19 DIAGNOSIS — Z9181 History of falling: Secondary | ICD-10-CM

## 2016-10-19 DIAGNOSIS — I69398 Other sequelae of cerebral infarction: Secondary | ICD-10-CM | POA: Diagnosis not present

## 2016-10-19 DIAGNOSIS — R7989 Other specified abnormal findings of blood chemistry: Secondary | ICD-10-CM | POA: Diagnosis not present

## 2016-10-19 DIAGNOSIS — S065X9A Traumatic subdural hemorrhage with loss of consciousness of unspecified duration, initial encounter: Secondary | ICD-10-CM

## 2016-10-19 DIAGNOSIS — Z91010 Allergy to peanuts: Secondary | ICD-10-CM | POA: Diagnosis not present

## 2016-10-19 DIAGNOSIS — I69391 Dysphagia following cerebral infarction: Secondary | ICD-10-CM | POA: Diagnosis not present

## 2016-10-19 DIAGNOSIS — I69354 Hemiplegia and hemiparesis following cerebral infarction affecting left non-dominant side: Secondary | ICD-10-CM

## 2016-10-19 DIAGNOSIS — I482 Chronic atrial fibrillation: Secondary | ICD-10-CM

## 2016-10-19 DIAGNOSIS — R51 Headache: Secondary | ICD-10-CM | POA: Diagnosis present

## 2016-10-19 DIAGNOSIS — R1310 Dysphagia, unspecified: Secondary | ICD-10-CM | POA: Diagnosis present

## 2016-10-19 DIAGNOSIS — R269 Unspecified abnormalities of gait and mobility: Secondary | ICD-10-CM

## 2016-10-19 DIAGNOSIS — G8929 Other chronic pain: Secondary | ICD-10-CM | POA: Diagnosis present

## 2016-10-19 DIAGNOSIS — I6200 Nontraumatic subdural hemorrhage, unspecified: Secondary | ICD-10-CM | POA: Diagnosis not present

## 2016-10-19 DIAGNOSIS — Z7982 Long term (current) use of aspirin: Secondary | ICD-10-CM

## 2016-10-19 DIAGNOSIS — R05 Cough: Secondary | ICD-10-CM | POA: Diagnosis not present

## 2016-10-19 DIAGNOSIS — Z7984 Long term (current) use of oral hypoglycemic drugs: Secondary | ICD-10-CM

## 2016-10-19 DIAGNOSIS — I10 Essential (primary) hypertension: Secondary | ICD-10-CM

## 2016-10-19 DIAGNOSIS — E1159 Type 2 diabetes mellitus with other circulatory complications: Secondary | ICD-10-CM

## 2016-10-19 DIAGNOSIS — Z79899 Other long term (current) drug therapy: Secondary | ICD-10-CM | POA: Diagnosis not present

## 2016-10-19 DIAGNOSIS — I4891 Unspecified atrial fibrillation: Secondary | ICD-10-CM | POA: Diagnosis present

## 2016-10-19 DIAGNOSIS — M549 Dorsalgia, unspecified: Secondary | ICD-10-CM | POA: Diagnosis present

## 2016-10-19 DIAGNOSIS — R350 Frequency of micturition: Secondary | ICD-10-CM | POA: Diagnosis not present

## 2016-10-19 DIAGNOSIS — E1142 Type 2 diabetes mellitus with diabetic polyneuropathy: Secondary | ICD-10-CM | POA: Diagnosis present

## 2016-10-19 DIAGNOSIS — Z90710 Acquired absence of both cervix and uterus: Secondary | ICD-10-CM | POA: Diagnosis not present

## 2016-10-19 DIAGNOSIS — G4733 Obstructive sleep apnea (adult) (pediatric): Secondary | ICD-10-CM | POA: Diagnosis present

## 2016-10-19 DIAGNOSIS — S065XAA Traumatic subdural hemorrhage with loss of consciousness status unknown, initial encounter: Secondary | ICD-10-CM

## 2016-10-19 DIAGNOSIS — K219 Gastro-esophageal reflux disease without esophagitis: Secondary | ICD-10-CM | POA: Diagnosis present

## 2016-10-19 DIAGNOSIS — E785 Hyperlipidemia, unspecified: Secondary | ICD-10-CM

## 2016-10-19 DIAGNOSIS — R2689 Other abnormalities of gait and mobility: Principal | ICD-10-CM | POA: Diagnosis present

## 2016-10-19 DIAGNOSIS — Z91018 Allergy to other foods: Secondary | ICD-10-CM | POA: Diagnosis not present

## 2016-10-19 DIAGNOSIS — I639 Cerebral infarction, unspecified: Secondary | ICD-10-CM | POA: Diagnosis present

## 2016-10-19 DIAGNOSIS — G8194 Hemiplegia, unspecified affecting left nondominant side: Secondary | ICD-10-CM | POA: Diagnosis not present

## 2016-10-19 LAB — GLUCOSE, CAPILLARY
GLUCOSE-CAPILLARY: 129 mg/dL — AB (ref 65–99)
GLUCOSE-CAPILLARY: 121 mg/dL — AB (ref 65–99)
Glucose-Capillary: 106 mg/dL — ABNORMAL HIGH (ref 65–99)
Glucose-Capillary: 125 mg/dL — ABNORMAL HIGH (ref 65–99)

## 2016-10-19 LAB — CBC
HCT: 37 % (ref 36.0–46.0)
Hemoglobin: 12.5 g/dL (ref 12.0–15.0)
MCH: 31.1 pg (ref 26.0–34.0)
MCHC: 33.8 g/dL (ref 30.0–36.0)
MCV: 92 fL (ref 78.0–100.0)
PLATELETS: 316 10*3/uL (ref 150–400)
RBC: 4.02 MIL/uL (ref 3.87–5.11)
RDW: 12.1 % (ref 11.5–15.5)
WBC: 10 10*3/uL (ref 4.0–10.5)

## 2016-10-19 LAB — BASIC METABOLIC PANEL
Anion gap: 12 (ref 5–15)
BUN: 8 mg/dL (ref 6–20)
CALCIUM: 8.9 mg/dL (ref 8.9–10.3)
CO2: 20 mmol/L — ABNORMAL LOW (ref 22–32)
CREATININE: 0.54 mg/dL (ref 0.44–1.00)
Chloride: 108 mmol/L (ref 101–111)
GFR calc Af Amer: >60 mL/min (ref >60)
GLUCOSE: 116 mg/dL — AB (ref 65–99)
POTASSIUM: 3.7 mmol/L (ref 3.5–5.1)
SODIUM: 140 mmol/L (ref 135–145)

## 2016-10-19 MED ORDER — ASPIRIN 325 MG PO TABS
325.0000 mg | ORAL_TABLET | Freq: Every day | ORAL | Status: DC
  Administered 2016-10-20 – 2016-11-01 (×13): 325 mg via ORAL
  Filled 2016-10-19 (×13): qty 1

## 2016-10-19 MED ORDER — ACETAMINOPHEN 325 MG PO TABS
650.0000 mg | ORAL_TABLET | Freq: Four times a day (QID) | ORAL | Status: DC | PRN
  Administered 2016-10-19: 650 mg via ORAL
  Filled 2016-10-19: qty 2

## 2016-10-19 MED ORDER — PRAVASTATIN SODIUM 10 MG PO TABS: 10.0000 mg | ORAL_TABLET | Freq: Every day | ORAL | Status: DC

## 2016-10-19 MED ORDER — ACETAMINOPHEN 325 MG PO TABS
650.0000 mg | ORAL_TABLET | Freq: Four times a day (QID) | ORAL | Status: DC | PRN
  Administered 2016-10-20: 650 mg via ORAL
  Filled 2016-10-19: qty 2

## 2016-10-19 MED ORDER — ASPIRIN EC 325 MG PO TBEC: 325.0000 mg | DELAYED_RELEASE_TABLET | Freq: Every day | ORAL | Status: DC

## 2016-10-19 MED ORDER — ONDANSETRON HCL 4 MG PO TABS
4.0000 mg | ORAL_TABLET | Freq: Four times a day (QID) | ORAL | Status: DC | PRN
Start: 2016-10-19 — End: 2016-11-01

## 2016-10-19 MED ORDER — PANTOPRAZOLE SODIUM 40 MG PO PACK
40.0000 mg | PACK | Freq: Every day | ORAL | Status: DC
  Administered 2016-10-20 – 2016-10-30 (×11): 40 mg via ORAL
  Filled 2016-10-19 (×8): qty 20

## 2016-10-19 MED ORDER — SENNOSIDES-DOCUSATE SODIUM 8.6-50 MG PO TABS
1.0000 | ORAL_TABLET | Freq: Every evening | ORAL | Status: DC | PRN
  Filled 2016-10-19: qty 1

## 2016-10-19 MED ORDER — BUTALBITAL-APAP-CAFFEINE 50-325-40 MG PO TABS
1.0000 | ORAL_TABLET | Freq: Two times a day (BID) | ORAL | Status: DC | PRN
  Administered 2016-10-20 – 2016-10-29 (×8): 1 via ORAL
  Filled 2016-10-19 (×9): qty 1

## 2016-10-19 MED ORDER — ONDANSETRON HCL 4 MG/2ML IJ SOLN: 4.0000 mg | Freq: Four times a day (QID) | INTRAMUSCULAR | Status: DC | PRN

## 2016-10-19 MED ORDER — SORBITOL 70 % SOLN: 30.0000 mL | Freq: Every day | Status: DC | PRN

## 2016-10-19 MED ORDER — ENOXAPARIN SODIUM 30 MG/0.3ML ~~LOC~~ SOLN
30.0000 mg | SUBCUTANEOUS | Status: DC
  Administered 2016-10-19 – 2016-10-31 (×13): 30 mg via SUBCUTANEOUS
  Filled 2016-10-19 (×13): qty 0.3

## 2016-10-19 MED ORDER — PANTOPRAZOLE SODIUM 40 MG PO TBEC: 40.0000 mg | DELAYED_RELEASE_TABLET | Freq: Every day | ORAL | Status: DC

## 2016-10-19 MED ORDER — INSULIN ASPART 100 UNIT/ML ~~LOC~~ SOLN
0.0000 [IU] | Freq: Three times a day (TID) | SUBCUTANEOUS | Status: DC
  Administered 2016-10-21 – 2016-10-28 (×8): 1 [IU] via SUBCUTANEOUS

## 2016-10-19 MED ORDER — ENOXAPARIN SODIUM 30 MG/0.3ML ~~LOC~~ SOLN: 30.0000 mg | SUBCUTANEOUS | Status: DC

## 2016-10-19 MED ORDER — HYDROCOD POLST-CPM POLST ER 10-8 MG/5ML PO SUER: 2.5000 mL | Freq: Two times a day (BID) | ORAL | Status: DC | PRN

## 2016-10-19 MED ORDER — PANTOPRAZOLE SODIUM 40 MG PO PACK
40.0000 mg | PACK | Freq: Every day | ORAL | Status: DC
  Administered 2016-10-19: 40 mg
  Filled 2016-10-19: qty 20

## 2016-10-19 MED ORDER — ASPIRIN 325 MG PO TABS
ORAL_TABLET | ORAL | Status: AC
  Filled 2016-10-19: qty 1

## 2016-10-19 MED ORDER — STROKE: EARLY STAGES OF RECOVERY BOOK
Freq: Once | Status: AC
  Administered 2016-10-19: 10:00:00
  Filled 2016-10-19: qty 1

## 2016-10-19 MED ORDER — HYDROCOD POLST-CPM POLST ER 10-8 MG/5ML PO SUER
2.5000 mL | Freq: Once | ORAL | Status: AC
  Administered 2016-10-19: 2.5 mL via ORAL
  Filled 2016-10-19: qty 5

## 2016-10-19 MED ORDER — ASPIRIN 325 MG PO TABS
325.0000 mg | ORAL_TABLET | Freq: Every day | ORAL | Status: DC
  Administered 2016-10-19: 325 mg via ORAL
  Filled 2016-10-19: qty 1

## 2016-10-19 NOTE — PMR Pre-admission (Signed)
PMR Admission Coordinator Pre-Admission Assessment  Patient: [name] is an 76 y.o., female MRN: [id number] DOB: 06-25-1941 Height: _0  (147.3 cm) Weight: 61.7 kg (136 lb 0.4 oz)              Insurance Information HMO:     PPO:      PCP:      IPA:      80/20:      OTHER:  PRIMARY: Medicare A and B      Policy#:  [id number]      Subscriber:  self CM Name:     Phone#:      Fax#:  Pre-Cert#:       Employer: not employed Benefits:  Phone #:      Name:  Eff. Date: Part A 12/16/06; Part B 08/17/06     Deduct:  $1340      Out of Pocket Max:  n/a      Life Max:  n/a CIR:  100% after deductible met      SNF:  100% first 20 days Outpatient:  80%     Co-Pay: 20% Home Health:  100%      Co-Pay:   DME:  80%     Co-Pay:  20% Providers:  Pt. choice SECONDARY:  Medicaid [place] [facility]       Policy#:  [id number]      Subscriber:  CM Name:       Phone#:      Fax#:  Pre-Cert#:       Employer:  Benefits:  Phone #:      Name:  Eff. 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 History taken from chart review and daughter. Presented 10/15/2016 with left-sided weakness and facial droop.. Patient lives with daughterand son-in-law who work. She does have another daughter who checks on herbut cannot provide 24-hour care. Reported to be  independent with assistive device prior to admission. Two-level home with bedroom on first floor and 3 steps to entry. Cranial CT scan showed positive hyperdense right MCA bifurcation suspicious for emergent large vessel occlusion. No intracranial hemorrhage. CT cerebral perfusion scan positive for emergent large vessel occlusion distal right M1 or right MCA bifurcation. Cerebral angiogram and underwent complete revascularization of occluded right MCA M1 segment per interventional radiology. 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 Petechial blood products without frank hematoma. Mild swelling without shift. MRA of the head 10/16/2016 wide patency of the vessels with recannulization of the right MCA. Echocardiogram with ejection fraction of 70% no wall motion abnormalities.  Carotid Dopplers With no ICA stenosis. Patient remained ventilatory support extubated 10/17/2016.  Aspirin for CVA prophylaxis and subcutaneous Lovenox for DVT prophylaxis. Awaitplan for possible Eliquis5 mg twice a day 14 days after stroke prevention due to hemorrhagic transformation. Modified barium swallow completed presently on a dysphagia #1 honey thick liquid diet. Physical and occupational therapy evaluations completed 10/17/2016 with recommendations of physical medicine rehabilitation consult.Patient was admitted for comprehensive rehabilitation program Total: 3 [facility]    Past Medical History  Past Medical History:  Diagnosis Date  . Atrial fibrillation ([place])   . Chronic back pain    "mid back down into lower back" (08/25/2014)  . GERD (gastroesophageal reflux disease)   . Hypertriglyceridemia   . OSA on CPAP   . Osteoarthritis    "knees, hands, back" (08/25/2014)  . Pneumonia ~ 2000 X 1  . Subdural hematoma ([place]) july 2015   S/P fall while on Coumadin  . T12 compression fracture ([place]) 2012  . Type II diabetes mellitus ([facility])     Family History  Family history is unknown  by patient.  Prior Rehab/Hospitalizations:  Has the patient had major surgery during 100 days prior to admission? No  Current Medications   Current Facility-Administered Medications:  .   stroke: mapping our early stages of recovery book, , Does not apply, Once, Kerney Elbe, MD .  acetaminophen (TYLENOL) tablet 650 mg, 650 mg, Oral, Q6H PRN, Rosalin Hawking, MD, 650 mg at 10/19/16 1046 .  aspirin tablet 325 mg, 325 mg, Oral, Daily, Rosalin Hawking, MD, 325 mg at 10/19/16 0942 .  butalbital-acetaminophen-caffeine (FIORICET, ESGIC) 50-325-40 MG per tablet 1 tablet, 1 tablet, Oral, Q12H PRN, Rosalin Hawking, MD, 1 tablet at 10/19/16 1300 .  chlorhexidine (PERIDEX) 0.12 % solution 15 mL, 15 mL, Mouth Rinse, BID, Rosalin Hawking, MD, 15 mL at 10/19/16 0918 .  chlorpheniramine-HYDROcodone (TUSSIONEX) 10-8 MG/5ML suspension 2.5 mL, 2.5 mL, Oral, Q12H PRN, Rosalin Hawking, MD .  enoxaparin (LOVENOX) injection 30 mg, 30 mg, Subcutaneous, Q24H, Rosalin Hawking, MD, 30 mg at 10/18/16 2145 .  insulin aspart (novoLOG) injection 0-9 Units, 0-9 Units, Subcutaneous, TID WC, Rosalin Hawking, MD, 1 Units at 10/19/16 1258 .  labetalol (NORMODYNE,TRANDATE) injection 10-20 mg, 10-20 mg, Intravenous, Q10 min PRN, Kerney Elbe, MD, 20 mg at 10/16/16 0630 .  MEDLINE mouth rinse, 15 mL, Mouth Rinse, q12n4p, Rosalin Hawking, MD, 15 mL at 10/19/16 1305 .  ondansetron (ZOFRAN) injection 4 mg, 4 mg, Intravenous, Q6H PRN, [name], MD .  pantoprazole sodium (PROTONIX) 40 mg/20 mL oral suspension 40 mg, 40 mg, Per Tube, Daily, Rosalin Hawking, MD, 40 mg at 10/19/16 0942 .  pravastatin (PRAVACHOL) tablet 10 mg, 10 mg, Oral, q1800, Donzetta Starch, NP .  RESOURCE THICKENUP CLEAR, , Oral, Once, Rosalin Hawking, MD .  senna-docusate (Senokot-S) tablet 1 tablet, 1 tablet, Oral, QHS PRN, Kerney Elbe, MD  Patients Current Diet: DIET - DYS 1 Room service appropriate? Yes; Fluid consistency: Honey Thick  Precautions / Restrictions Precautions Precautions:  Fall Precaution Comments: left inattention Restrictions Weight Bearing Restrictions: No   Has the patient had 2 or more falls or a fall with injury in the past year?No  Prior Activity Level Limited Community (1-2x/wk): Per daughter, pt. goes out 2 times per week with daughter to grocery store or to [place] / [place] Devices/Equipment: Kasandra Knudsen (specify quad or straight) Home Equipment: Walker - standard, Cane - single point  Prior Device Use: Indicate devices/aids used by the  patient prior to current illness, exacerbation or injury? Pt. Uses cane on occasion  Prior Functional Level Prior Function Level of Independence: Independent with assistive device(s), Needs assistance Gait / Transfers Assistance Needed: PTA, used cane intermittently per daughter ADL's / Homemaking Assistance Needed: I with ADLs  Self Care: Did the patient need help bathing, dressing, using the toilet or eating?  Independent  Indoor Mobility: Did the patient need assistance with walking from room to room (with or without device)? Independent  Stairs: Did the patient need assistance with internal or external stairs (with or without device)? Independent  Functional Cognition: Did the patient need help planning regular tasks such as shopping or remembering to take medications? Needed some help  Current Functional Level Cognition  Overall Cognitive Status: Difficult to assess Difficult to assess due to: Non-English speaking Orientation Level: Oriented X4 Following Commands: Follows one step commands with increased time Safety/Judgement: Decreased awareness of safety, Decreased awareness of deficits General Comments: Pt with left inattention but seems improved from prior session. Difficult to fully assess due to language barrier.     Extremity Assessment (includes Sensation/Coordination)  Upper Extremity Assessment: LUE deficits/detail LUE Deficits / Details: grossly 4-/5.  impaired coordiantion and sensation LUE Sensation: decreased light touch LUE Coordination: decreased fine motor, decreased gross motor  Lower Extremity Assessment: Defer to PT evaluation LLE Deficits / Details: grossly 3-5 LLE Sensation: decreased light touch    ADLs  Overall ADL's : Needs assistance/impaired Eating/Feeding: Minimal assistance, Sitting Grooming: Minimal assistance, Standing Grooming Details (indicate cue type and reason): washing hands and abandoned RW Upper Body Bathing: Moderate assistance, Sitting Lower Body Bathing: Moderate assistance, Sit to/from stand Upper Body Dressing : Minimal assistance, Sitting Lower Body Dressing: Moderate assistance, Sit to/from stand Lower Body Dressing Details (indicate cue type and reason): Pt able to don socks sitting EOB with min assist Toilet Transfer: Minimal assistance, Ambulation, Regular Toilet Toileting- Clothing Manipulation and Hygiene: Min guard, Sitting/lateral lean Toileting - Clothing Manipulation Details (indicate cue type and reason): for peri care only Functional mobility during ADLs: Minimal assistance General ADL Comments: pt needs cues for safety with RW. pt abandons Rw all together during session . Pt needs cues to look L     Mobility  Overal bed mobility: Needs Assistance Bed Mobility: Supine to Sit Supine to sit: Min assist, HOB elevated General bed mobility comments: Pt OOB coming out of bathroom with OT    Transfers  Overall transfer level: Needs assistance Equipment used: 1 person hand held assist Transfers: Sit to/from Stand Sit to Stand: Min assist General transfer comment: Min hand held assist to support pt during transition to sit down.     Ambulation / Gait / Stairs / Wheelchair Mobility  Ambulation/Gait Ambulation/Gait assistance: Museum/gallery curator (Feet): 180 Feet Assistive device: 1 person hand held assist Gait Pattern/deviations: Step-through pattern, Decreased step length -  left (pitches forward at times) General Gait Details: Pt with better ability to maintain longer step length with visual cues.  Pt also reaching (not always succesfully) with left hand for hallway railing.  Min assist for balance as pt pitches anteriorly at times. Also, RN reports monitor states HR is 140 during gait.  Gait velocity: decreased Gait velocity interpretation: Below normal speed for age/gender Stairs: Yes Stairs assistance: Min assist Stair Management: Two rails, Alternating pattern, Forwards Number of Stairs: 2 General stair comments: Heavy min assist for safety and balance.  Pt going reciprocally and putting her weaker leg up first.  Posture / Balance Dynamic Sitting Balance Sitting balance - Comments: Pt sat EOB and reached down to get paper towel up off of floor with supervision Balance Overall balance assessment: Needs assistance Sitting-balance support: Feet supported, No upper extremity supported Sitting balance-Leahy Scale: Good Sitting balance - Comments: Pt sat EOB and reached down to get paper towel up off of floor with supervision Standing balance support: No upper extremity supported, Single extremity supported Standing balance-Leahy Scale: Fair Standing balance comment: Pt with one hand supported reached down to get something off of the floor in standing.     Special needs/care consideration BiPAP/CPAP  Pt. With sleep apnea per daughter but never used her CPAP CPM   no Continuous Drip IV   no Dialysis   no       Life Vest   no Oxygen   no Special Bed   no Trach Size  n/a Wound Vac (area)   no       Skin    WDLs                             Bowel mgmt:    Last BM 10/18/16, loose stool, incontinent Bladder mgmt: intermittent incontinence Diabetic mgmt metformin at home     Previous Home Environment Living Arrangements: Children Available Help at Discharge: Family, Available PRN/intermittently (lives with daughter who works, sister can come by) Type of  Home: House Home Layout: Two level, Able to live on main level with bedroom/bathroom Alternate Level Stairs-Rails: Right Alternate Level Stairs-Number of Steps: 12 Home Access: Stairs to enter Entrance Stairs-Rails: Right Entrance Stairs-Number of Steps: 3 Bathroom Shower/Tub: Optometrist: Yes Home Care Services: No  Discharge Living Setting Plans for Discharge Living Setting: Patient's home Type of Home at Discharge: House Discharge Home Layout: Multi-level, Able to live on main level with bedroom/bathroom Discharge Home Access: Stairs to enter Entrance Stairs-Rails: Left Entrance Stairs-Number of Steps: 3 Discharge Bathroom Shower/Tub: Tub/shower unit Discharge Bathroom Toilet: Standard Discharge Bathroom Accessibility: Yes How Accessible: Accessible via walker Does the patient have any problems obtaining your medications?: No  Social/Family/Support Systems Anticipated Caregiver: Daughter, Ezzie Senat Anticipated Caregiver's Contact Information: Keyshla Tunison, [phone] Ability/Limitations of Caregiver: daughter works as a Software engineer at [employer]. Shaneen's sister will check on pt. while Naryah is working Careers adviser: Intermittent Discharge Plan Discussed with Primary Caregiver: Yes Is Caregiver In Agreement with Plan?: Yes Does Caregiver/Family have Issues with Lodging/Transportation while Pt is in Rehab?: No   Goals/Additional Needs Patient/Family Goal for Rehab: modified independent PT/OT/SLP Expected length of stay: 14-18 days Cultural Considerations: Pt. is from [place], speaks little [place] and practices Buddhism.  Daughter, Steffani has requested to interpret for the patient and pt. expresses agreement Dietary Needs: dysphagia 1, honey thick liquids Equipment Needs: TBA Pt/Family Agrees to Admission and willing to participate: Yes Program Orientation Provided & Reviewed with Pt/Caregiver Including Roles  &  Responsibilities: Yes   Decrease burden of Care through IP rehab admission: n/a   Possible need for SNF placement upon discharge:   Not expected   Patient Condition: This patient's condition remains as documented in the consult dated 10/18/16 , in which the Rehabilitation Physician determined and documented that the patient's condition is appropriate for intensive rehabilitative care in an inpatient rehabilitation facility. Will admit to inpatient rehab today.   Preadmission Screen Completed By:  Gerlean Ren, 10/19/2016 3:21 PM ______________________________________________________________________   Discussed status with Dr.  Patel on 10/19/16 at  1521  and received telephone approval for admission today.  Admission Coordinator:  Gerlean Ren, time 4037 /Date 10/19/16

## 2016-10-19 NOTE — Progress Notes (Signed)
Attempted to notify patient's daughter of transfer to rehab unit. No answer on number left in chart.

## 2016-10-19 NOTE — Progress Notes (Signed)
Gerlean [facility] Admission Coordinator Signed Physical Medicine and Rehabilitation  PMR Pre-admission Date of Service: 10/19/2016 3:10 PM  Related encounter: ED to Hosp-Admission (Discharged) from 10/15/2016 in [place]       '[]' Hide copied text PMR Admission Coordinator Pre-Admission Assessment  Patient: [name] is an 76 y.o., female MRN: [id number] DOB: January 05, 1941 Height: '4\' 10"'  (147.3 cm) Weight: 61.7 kg (136 lb 0.4 oz)                                                                                                                                                  Insurance Information HMO:     PPO:      PCP:      IPA:      80/20:      OTHER:  PRIMARY: Medicare A and B      Policy#:  [id number]      Subscriber:  self CM Name:     Phone#:      Fax#:  Pre-Cert#:       Employer: not employed Benefits:  Phone #:      Name:  Eff. Date: Part A 12/16/06; Part B 08/17/06     Deduct:  $1340      Out of Pocket Max:  n/a      Life Max:  n/a CIR:  100% after deductible met      SNF:  100% first 20 days Outpatient:  80%     Co-Pay: 20% Home Health:  100%      Co-Pay:   DME:  80%     Co-Pay:  20% Providers:  Pt. choice SECONDARY:  Medicaid [place]       Policy#:  [id number]      Subscriber:  CM Name:       Phone#:      Fax#:  Pre-Cert#:       Employer:  Benefits:  Phone #:      Name:  Eff. 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 History taken from chart review and daughter. Presented 10/15/2016 with left-sided weakness and facial droop.. Patient lives with daughterand son-in-law who work. She does have another daughter who checks on herbut cannot provide 24-hour care. Reported to be independent with assistive device prior to admission. Two-level home with bedroom on first floor and 3 steps to entry. Cranial CT scan showed positive hyperdense right MCA bifurcation suspicious for emergent large vessel occlusion. No intracranial hemorrhage. CT cerebral perfusion scan positive for emergent large vessel occlusion distal right M1 or right MCA bifurcation. Cerebral angiogram and underwent complete revascularization of occluded right MCA M1 segment per interventional radiology. 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 Petechial blood products without frank hematoma. Mild swelling without shift. MRA of the head 10/16/2016 wide patency of the vessels with recannulization of the right MCA. Echocardiogram with ejection fraction of 70% no wall motion abnormalities. Carotid Dopplers With no ICA stenosis. Patient remained ventilatory support extubated 10/17/2016. Aspirin for CVA prophylaxisand subcutaneous Lovenox for DVT prophylaxis. Awaitplan for possible Eliquis5 mg twice a day 14 days after stroke prevention due to hemorrhagic transformation.Modified barium swallow completed presently on a dysphagia #1 honey thick liquid diet.Physical and occupational therapy evaluations completed 10/17/2016 with recommendations of physical medicine rehabilitation consult.Patient was admitted for comprehensive rehabilitation program Total: 3 [facility]    Past Medical History      Past Medical History:  Diagnosis Date  .  Atrial fibrillation ([place])   . Chronic back pain    "mid back down into lower back" (08/25/2014)  . GERD (gastroesophageal reflux disease)   . Hypertriglyceridemia   . OSA on CPAP   . Osteoarthritis    "knees, hands, back" (08/25/2014)  . Pneumonia ~ 2000 X 1  . Subdural hematoma ([place]) july 2015   S/P fall while on Coumadin  . T12 compression fracture ([place]) 2012  . Type II diabetes mellitus ([facility])     Family History  Family history is unknown by patient.  Prior Rehab/Hospitalizations:  Has the patient had major surgery during 100 days prior to admission? No  Current Medications   Current Facility-Administered Medications:  .   stroke: mapping our early stages of recovery book, , Does not apply, Once, Kerney Elbe, MD .  acetaminophen (TYLENOL) tablet 650 mg, 650 mg, Oral, Q6H PRN, Rosalin Hawking, MD, 650 mg at 10/19/16 1046 .  aspirin tablet 325 mg, 325 mg, Oral, Daily, Rosalin Hawking, MD, 325 mg at 10/19/16 0942 .  butalbital-acetaminophen-caffeine (FIORICET, ESGIC) 50-325-40 MG per tablet 1 tablet, 1 tablet, Oral, Q12H PRN, Rosalin Hawking, MD, 1 tablet at 10/19/16 1300 .  chlorhexidine (PERIDEX) 0.12 % solution 15 mL, 15 mL, Mouth Rinse, BID, Rosalin Hawking, MD, 15 mL at 10/19/16 0918 .  chlorpheniramine-HYDROcodone (TUSSIONEX) 10-8 MG/5ML suspension 2.5 mL, 2.5 mL, Oral, Q12H PRN, Rosalin Hawking, MD .  enoxaparin (LOVENOX) injection 30 mg, 30 mg, Subcutaneous, Q24H, Rosalin Hawking, MD, 30 mg at 10/18/16 2145 .  insulin aspart (novoLOG) injection 0-9 Units, 0-9 Units, Subcutaneous, TID WC, Rosalin Hawking, MD, 1 Units at 10/19/16 1258 .  labetalol (NORMODYNE,TRANDATE) injection 10-20 mg, 10-20 mg, Intravenous, Q10 min PRN, Kerney Elbe, MD, 20 mg at 10/16/16 1062 .  MEDLINE mouth rinse, 15 mL, Mouth Rinse, q12n4p, Rosalin Hawking, MD, 15 mL at 10/19/16 1305 .  ondansetron (ZOFRAN)  injection 4 mg, 4 mg, Intravenous, Q6H PRN, [name], MD .  pantoprazole sodium (PROTONIX) 40 mg/20 mL oral  suspension 40 mg, 40 mg, Per Tube, Daily, Rosalin Hawking, MD, 40 mg at 10/19/16 0942 .  pravastatin (PRAVACHOL) tablet 10 mg, 10 mg, Oral, q1800, Donzetta Starch, NP .  RESOURCE THICKENUP CLEAR, , Oral, Once, Rosalin Hawking, MD .  senna-docusate (Senokot-S) tablet 1 tablet, 1 tablet, Oral, QHS PRN, Kerney Elbe, MD  Patients Current Diet: DIET - DYS 1 Room service appropriate? Yes; Fluid consistency: Honey Thick  Precautions / Restrictions Precautions Precautions: Fall Precaution Comments: left inattention Restrictions Weight Bearing Restrictions: No   Has the patient had 2 or more falls or a fall with injury in the past year?No  Prior Activity Level Limited Community (1-2x/wk): Per daughter, pt. goes out 2 times per week with daughter to grocery store or to [place] / [place] Devices/Equipment: Radio producer (specify quad or straight) Home Equipment: Walker - standard, Cane - single point  Prior Device Use: Indicate devices/aids used by the patient prior to current illness, exacerbation or injury? Pt. Uses cane on occasion  Prior Functional Level Prior Function Level of Independence: Independent with assistive device(s), Needs assistance Gait / Transfers Assistance Needed: PTA, used cane intermittently per daughter ADL's / Homemaking Assistance Needed: I with ADLs  Self Care: Did the patient need help bathing, dressing, using the toilet or eating?  Independent  Indoor Mobility: Did the patient need assistance with walking from room to room (with or without device)? Independent  Stairs: Did the patient need assistance with internal or external stairs (with or without device)? Independent  Functional Cognition: Did the patient need help planning regular tasks such as shopping or remembering to take medications? Needed some help  Current Functional Level Cognition Overall Cognitive Status: Difficult to assess Difficult to assess due to:  Non-English speaking Orientation Level: Oriented X4 Following Commands: Follows one step commands with increased time Safety/Judgement: Decreased awareness of safety, Decreased awareness of deficits General Comments: Pt with left inattention but seems improved from prior session. Difficult to fully assess due to language barrier.     Extremity Assessment (includes Sensation/Coordination) Upper Extremity Assessment: LUE deficits/detail LUE Deficits / Details: grossly 4-/5. impaired coordiantion and sensation LUE Sensation: decreased light touch LUE Coordination: decreased fine motor, decreased gross motor  Lower Extremity Assessment: Defer to PT evaluation LLE Deficits / Details: grossly 3-5 LLE Sensation: decreased light touch   ADLs Overall ADL's : Needs assistance/impaired Eating/Feeding: Minimal assistance, Sitting Grooming: Minimal assistance, Standing Grooming Details (indicate cue type and reason): washing hands and abandoned RW Upper Body Bathing: Moderate assistance, Sitting Lower Body Bathing: Moderate assistance, Sit to/from stand Upper Body Dressing : Minimal assistance, Sitting Lower Body Dressing: Moderate assistance, Sit to/from stand Lower Body Dressing Details (indicate cue type and reason): Pt able to don socks sitting EOB with min assist Toilet Transfer: Minimal assistance, Ambulation, Regular Toilet Toileting- Clothing Manipulation and Hygiene: Min guard, Sitting/lateral lean Toileting - Clothing Manipulation Details (indicate cue type and reason): for peri care only Functional mobility during ADLs: Minimal assistance General ADL Comments: pt needs cues for safety with RW. pt abandons Rw all together during session . Pt needs cues to look L    Mobility Overal bed mobility: Needs Assistance Bed Mobility: Supine to Sit Supine to sit: Min assist, HOB elevated General bed mobility comments: Pt OOB coming out of bathroom with OT   Transfers Overall transfer level: Needs  assistance Equipment used: 1 person hand held assist Transfers: Sit to/from Stand Sit to Stand: Min assist General transfer comment: Min hand held assist to support pt during transition to sit down.    Ambulation / Gait / Stairs / Wheelchair Mobility Ambulation/Gait Ambulation/Gait assistance: Museum/gallery curator (Feet): 180 Feet Assistive device: 1 person hand held assist Gait Pattern/deviations: Step-through pattern, Decreased step length - left (pitches forward at times) General Gait Details: Pt with better ability to maintain longer step length with visual cues.  Pt also reaching (not always succesfully) with left hand for hallway railing.  Min assist for balance as pt pitches anteriorly at times. Also, RN reports monitor states HR is 140 during gait.  Gait velocity: decreased Gait velocity interpretation: Below normal speed for age/gender Stairs: Yes Stairs assistance: Min assist Stair Management: Two rails, Alternating pattern, Forwards Number of Stairs: 2 General stair comments: Heavy min assist for safety and balance.  Pt going reciprocally and putting her weaker leg up first.     Posture / Balance Dynamic Sitting Balance Sitting balance - Comments: Pt sat EOB and reached down to get paper towel up off of floor with supervision Balance Overall balance assessment: Needs assistance Sitting-balance support: Feet supported, No upper extremity supported Sitting balance-Leahy Scale: Good Sitting balance - Comments: Pt sat EOB and reached down to get paper towel up off of floor with supervision Standing balance support: No upper extremity supported, Single extremity supported Standing balance-Leahy Scale: Fair Standing balance comment: Pt with one hand supported reached down to get something off of the floor in standing.    Special needs/care consideration BiPAP/CPAP  Pt. With sleep apnea per daughter but never used her CPAP CPM   no Continuous Drip IV   no Dialysis   no        Life Vest   no Oxygen   no Special Bed   no Trach Size  n/a Wound Vac (area)   no       Skin    WDLs                             Bowel mgmt:    Last BM 10/18/16, loose stool, incontinent Bladder mgmt: intermittent incontinence Diabetic mgmt metformin at home    Previous Home Environment Living Arrangements: Children Available Help at Discharge: Family, Available PRN/intermittently (lives with daughter who works, sister can come by) Type of Home: House Home Layout: Two level, Able to live on main level with bedroom/bathroom Alternate Level Stairs-Rails: Right Alternate Level Stairs-Number of Steps: 12 Home Access: Stairs to enter Entrance Stairs-Rails: Right Entrance Stairs-Number of Steps: 3 Bathroom Shower/Tub: Optometrist: Yes Home Care Services: No  Discharge Living Setting Plans for Discharge Living Setting: Patient's home Type of Home at Discharge: House Discharge Home Layout: Multi-level, Able to live on main level with bedroom/bathroom Discharge Home Access: Stairs to enter Entrance Stairs-Rails: Left Entrance Stairs-Number of Steps: 3 Discharge Bathroom Shower/Tub: Tub/shower unit Discharge Bathroom Toilet: Standard Discharge Bathroom Accessibility: Yes How Accessible: Accessible via walker Does the patient have any problems obtaining your medications?: No  Social/Family/Support Systems Anticipated Caregiver: Daughter, Thandiwe Siragusa Anticipated Caregiver's Contact Information: Berenise Hunton, [phone] Ability/Limitations of Caregiver: daughter works as a Software engineer at [employer]. Sheronda's sister will check on pt. while Anastaisa is working Caregiver Availability: Intermittent Discharge Plan Discussed with Primary Caregiver: Yes Is Caregiver In Agreement with Plan?: Yes Does Caregiver/Family  have Issues with Lodging/Transportation while Pt is in Rehab?: No   Goals/Additional Needs Patient/Family Goal for  Rehab: modified independent PT/OT/SLP Expected length of stay: 14-18 days Cultural Considerations: Pt. is from [place], speaks little [place] and practices Buddhism.  Daughter, Fallan has requested to interpret for the patient and pt. expresses agreement Dietary Needs: dysphagia 1, honey thick liquids Equipment Needs: TBA Pt/Family Agrees to Admission and willing to participate: Yes Program Orientation Provided & Reviewed with Pt/Caregiver Including Roles  & Responsibilities: Yes   Decrease burden of Care through IP rehab admission: n/a   Possible need for SNF placement upon discharge:   Not expected   Patient Condition: This patient's condition remains as documented in the consult dated 10/18/16 , in which the Rehabilitation Physician determined and documented that the patient's condition is appropriate for intensive rehabilitative care in an inpatient rehabilitation facility. Will admit to inpatient rehab today.   Preadmission Screen Completed By:  Gerlean Ren, 10/19/2016 3:21 PM ______________________________________________________________________   Discussed status with Dr.  Posey Pronto on 10/19/16 at  1521  and received telephone approval for admission today.  Admission Coordinator:  Gerlean Ren, time 5035 /Date 10/19/16       Cosigned by: Ankit [name], MD at 10/19/2016 3:24 PM  Revision History

## 2016-10-19 NOTE — Progress Notes (Signed)
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 right handed female with history of atrial fibrillation on no anticoagulation due to history of falls and SDH, diabetes mellitus.  History taken from chart review and daughter. Presented 10/15/2016 with left-sided weakness and facial droop.. Patient lives with daughter and son-in-law who work. She does have another daughter who checks on her but cannot provide 24-hour care. Reported to be independent with assistive device prior to admission. Two-level home with bedroom on first floor and 3 steps to entry. Cranial CT scan showed positive hyperdense right MCA bifurcation suspicious for emergent large vessel occlusion. No intracranial hemorrhage. CT cerebral perfusion scan positive for emergent large vessel occlusion distal right M1 or right MCA bifurcation. Cerebral angiogram and underwent complete revascularization of occluded right MCA M1 segment per interventional radiology. 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 Petechial blood products without frank hematoma. Mild swelling without shift. MRA of the head 10/16/2016 wide patency of the vessels with recannulization of the right MCA. Echocardiogram with ejection fraction of 70% no wall motion abnormalities. Carotid Dopplers are pending. Patient remained ventilatory support extubated 10/17/2016. Maintained on intravenous Cardizem for atrial fibrillation. Aspirin for CVA prophylaxis await plan for  possible Eliquis 5 mg twice a day 14 days after stroke prevention due to hemorrhagic transformation. Physical and occupational therapy evaluations completed 10/17/2016 with recommendations of physical medicine rehabilitation consult.   Review of Systems  Unable to perform ROS: Language       Past Medical History:  Diagnosis Date  . Atrial fibrillation (HCC)   . Chronic back pain    "mid back down into lower back" (08/25/2014)  . GERD (gastroesophageal reflux disease)   . Hypertriglyceridemia   . OSA on CPAP   . Osteoarthritis    "knees, hands, back" (08/25/2014)  . Pneumonia ~ 2000 X 1  . Subdural hematoma (HCC) july 2015   S/P fall while on Coumadin  . T12 compression fracture (HCC) 2012  . Type II diabetes mellitus (HCC)         Past Surgical History:  Procedure Laterality Date  . APPENDECTOMY  2012  . CATARACT EXTRACTION W/ INTRAOCULAR LENS  IMPLANT, BILATERAL Bilateral 2000's  . RADIOLOGY WITH ANESTHESIA N/A 10/15/2016   Procedure: RADIOLOGY WITH ANESTHESIA;  Surgeon: [name], MD;  Location: [facility] OR;  Service: Radiology;  Laterality: N/A;  . TOTAL ABDOMINAL HYSTERECTOMY  1990        Family History  Problem Relation Age of Onset  . Family history unknown: Yes   Social History:  reports that she is a non-smoker but has been exposed to tobacco smoke. She has never used smokeless tobacco. She reports that she does not drink alcohol or use drugs. Allergies:       Allergies  Allergen Reactions  . Banana Swelling    Hands and feet  . Chocolate Itching  . Peanut-Containing Drug Products Itching    Throat itches, no swelling         Medications Prior to Admission  Medication Sig Dispense Refill  . albuterol (ACCUNEB) 0.63 MG/3ML nebulizer solution Take 0.63 mg by nebulization every 6 (six) hours as needed. When unable to use inaler    . alendronate (FOSAMAX) 70 MG tablet Take 70 mg by mouth every Monday.     [name] aspirin EC 81 MG tablet  Take 81 mg by mouth daily.    [name] azithromycin (ZITHROMAX) 250 MG tablet Take 250-500 mg by mouth See admin instructions. Pt takes 500mg  on first day 12/28, then 250mg  daily therafter    . b complex vitamins tablet Take 1 tablet by mouth daily.    . Calcium-Vitamin D (CALTRATE 600 PLUS-VIT D PO) Take 1 tablet by mouth 2 (two) times daily.    . chlorpheniramine-HYDROcodone (TUSSIONEX PENNKINETIC ER) 10-8 MG/5ML LQCR Take 5 mLs by mouth every 12 (twelve) hours as needed for cough. (Patient taking differently: Take 2.5-5 mLs by mouth every 12 (twelve) hours as needed for cough. ) 115 mL 0  . fluticasone (FLONASE) 50 MCG/ACT nasal spray Place 1 spray into both nostrils daily as needed for allergies.     . fluticasone (FLOVENT HFA) 110 MCG/ACT inhaler Inhale 1 puff into the lungs 2 (two) times daily.    [name] HYDROcodone-acetaminophen (NORCO/VICODIN) 5-325 MG per tablet Take 1 tablet by mouth every 6 (six) hours as needed for severe pain. 30 tablet 0  . Krill Oil 1000 MG CAPS Take 1,000 mg by mouth 2 (two) times daily.     [name] levalbuterol (XOPENEX HFA) 45 MCG/ACT inhaler Inhale 1 puff into the lungs every 4 (four) hours as needed.    . metFORMIN (GLUCOPHAGE-XR) 500 MG 24 hr tablet Take 500 mg by mouth 2 (two) times daily.     . metoprolol tartrate (LOPRESSOR) 25 MG tablet Take 12.5 mg by mouth 2 (two) times daily.    . montelukast (SINGULAIR) 10 MG tablet Take 10 mg by mouth at bedtime.   12  . Multiple Vitamin (DAILY MULTIVITAMIN PO) Take 1 tablet by mouth daily.    . pravastatin (PRAVACHOL) 10 MG tablet Take 10 mg by mouth daily.    . traMADol (ULTRAM) 50 MG tablet Take 1 tablet (50 mg total) by mouth 2 (two) times daily. (Patient taking differently: Take 50 mg by mouth 2 (two) times daily as needed. ) 30 tablet 0  . albuterol (PROVENTIL HFA;VENTOLIN HFA) 108 (90 BASE) MCG/ACT inhaler Inhale 2 puffs into the lungs every 6 (six) hours as needed for wheezing or shortness of breath.  (Patient not taking: Reported on 10/16/2016) 1 Inhaler 2  . bacitracin ointment Apply 1 application topically 2 (two) times daily. (Patient not taking: Reported on 10/16/2016) 120 g 0  . docusate sodium 100 MG CAPS Take 100 mg by mouth 2 (two) times daily. (Patient not taking: Reported on 10/16/2016) 10 capsule 0  . levofloxacin (LEVAQUIN) 750 MG tablet Take 1 tablet (750 mg total) by mouth daily. (Patient not taking: Reported on 10/16/2016) 6 tablet 0    Home: Home Living Family/patient expects to be discharged to:: Private residence Living Arrangements: Children Available Help at Discharge: Family, Available PRN/intermittently (lives with daughter who works, sister can come by) Type of Home: House Home Access: Stairs to enter Secretary/administrator of Steps: 3 Entrance Stairs-Rails: Right Home Layout: Two level, Able to live on main level with bedroom/bathroom Alternate Level Stairs-Number of Steps: 12 Alternate Level Stairs-Rails: Right Bathroom Shower/Tub: Engineer, manufacturing systems: Standard Bathroom Accessibility: Yes Home Equipment: Dan Humphreys - standard, Cane - single point  Functional History: Prior Function Level of Independence: Independent with assistive device(s), Needs assistance Gait / Transfers Assistance Needed: PTA, used cane intermittently per daughter ADL's / Homemaking Assistance Needed: I with ADLs Functional Status:  Mobility: Bed Mobility Overal bed mobility: Needs Assistance Bed Mobility: Supine to Sit Supine to sit: +2 for safety/equipment, HOB elevated, Min assist General bed mobility comments: Assist for moving LEs off bed and for elevation of trunk. Transfers Overall transfer level: Needs assistance Equipment used: 2 person hand held assist Transfers: Sit to/from Stand Sit to Stand: Min assist, +2 safety/equipment General transfer comment: Pt needed steadying assist of 2 persons HHA.  Slight posterior lean as well as flexed posture therefore  unsteady on feet.  Ambulation/Gait Ambulation/Gait assistance: Min assist, +2 safety/equipment Ambulation Distance (Feet): 47 Feet (12 feet and then 35 feet) Assistive device: 2 person hand held assist Gait Pattern/deviations: Step-to pattern, Decreased step length - right, Decreased step length - left, Decreased stride length, Shuffle, Drifts right/left, Trunk flexed, Wide base of support General Gait Details: Pt was able to ambulate with intiailly 2 person assist and progressing to 1 person assist.  Pt with wide BOS with short steps but did lengthen steps with commands.  Pt with flexed posture as well.  Pt needed steading assist but for the most part was min assist with 2ned person for lines and safety.  Gait velocity interpretation: Below normal speed for age/gender    ADL: ADL Overall ADL's : Needs assistance/impaired Eating/Feeding: Minimal assistance, Sitting Grooming: Moderate assistance, Sitting Upper Body Bathing: Moderate assistance, Sitting Lower Body Bathing: Moderate assistance, Sit to/from stand Upper Body Dressing : Minimal assistance, Sitting Lower Body Dressing: Moderate assistance, Sit to/from stand Lower Body Dressing Details (indicate cue type and reason): Pt able to don socks sitting EOB with min assist Toilet Transfer: Minimal assistance, +2 for physical assistance, Ambulation, Regular Toilet, Grab bars Toileting- Clothing Manipulation and Hygiene: Min guard, Sitting/lateral lean Toileting - Clothing Manipulation Details (indicate cue type and reason): for peri care only Functional mobility during ADLs: Minimal assistance, +2 for physical assistance General ADL Comments: Educated pt and daughter on functional use of LUE and continued ROM throughout the day.  Cognition: Cognition Overall Cognitive Status: Impaired/Different from baseline Orientation Level: Oriented to person, Oriented to place, Oriented to time, Disoriented to situation (said she fell but needs  reminder of stroke) Cognition Arousal/Alertness: Awake/alert Behavior During Therapy: Flat affect Overall Cognitive Status: Impaired/Different from baseline Area of Impairment: Following commands, Safety/judgement, Awareness, Problem solving Following Commands: Follows one step commands with increased time Safety/Judgement: Decreased awareness of safety, Decreased awareness of deficits Problem Solving: Difficulty sequencing, Requires verbal cues, Requires tactile cues General Comments: Question whether pt has left inattention to task. Difficult to assess cognition fully due to language barrier as well.   Blood pressure 107/73, pulse 89, temperature 97.6 F (36.4 C), temperature source Oral, resp. rate 14, height 4\' 10"  (1.473 m), weight 61.7 kg (136 lb 0.4 oz), SpO2 96 %. Physical Exam  Vitals reviewed. Constitutional: She appears well-developed and well-nourished.  HENT:  Head: Normocephalic and atraumatic.  Eyes: Conjunctivae and EOM are normal.  Neck: Normal range of motion. Neck supple. No tracheal deviation present. No thyromegaly present.  Cardiovascular:  Irregularly irregular  Respiratory: Effort normal and breath sounds normal. No respiratory distress.  GI: Soft. Bowel sounds are normal. She exhibits no distension.  Musculoskeletal: She exhibits no edema or tenderness.  Neurological: She is alert.  Exam limited by language barrier.  She does follow  simple demonstrated commands. Appears oriented x3 per daughter Motor: RUE: 4+/5 proximal to distal LUE: 4/5 proximal to distal RLE: 4/5 (pain inhibition - baseline) LLE: 4/5 proximal to distal Sensation intact to light touch DTRs symmetric  Skin: Skin is warm and dry.  Psychiatric:  Unable to assess due to language barrier    Lab Results Last 24 Hours       Results for orders placed or performed during the hospital encounter of 10/15/16 (from the past 24 hour(s))  CBC     Status: Abnormal   Collection Time: 10/17/16   8:28 AM  Result Value Ref Range   WBC 10.0 4.0 - 10.5 K/uL   RBC 3.75 (L) 3.87 - 5.11 MIL/uL   Hemoglobin 11.8 (L) 12.0 - 15.0 g/dL   HCT 16.1 (L) 09.6 - 04.5 %   MCV 93.3 78.0 - 100.0 fL   MCH 31.5 26.0 - 34.0 pg   MCHC 33.7 30.0 - 36.0 g/dL   RDW 40.9 81.1 - 91.4 %   Platelets 307 150 - 400 K/uL  Basic metabolic panel     Status: Abnormal   Collection Time: 10/17/16  8:28 AM  Result Value Ref Range   Sodium 142 135 - 145 mmol/L   Potassium 3.5 3.5 - 5.1 mmol/L   Chloride 111 101 - 111 mmol/L   CO2 25 22 - 32 mmol/L   Glucose, Bld 128 (H) 65 - 99 mg/dL   BUN 6 6 - 20 mg/dL   Creatinine, Ser 7.82 0.44 - 1.00 mg/dL   Calcium 8.6 (L) 8.9 - 10.3 mg/dL   GFR calc non Af Amer >60 >60 mL/min   GFR calc Af Amer >60 >60 mL/min   Anion gap 6 5 - 15  Glucose, capillary     Status: None   Collection Time: 10/17/16  9:02 PM  Result Value Ref Range   Glucose-Capillary 97 65 - 99 mg/dL   Comment 1 Notify RN    Comment 2 Document in Chart   CBC     Status: Abnormal   Collection Time: 10/18/16  4:24 AM  Result Value Ref Range   WBC 10.7 (H) 4.0 - 10.5 K/uL   RBC 3.68 (L) 3.87 - 5.11 MIL/uL   Hemoglobin 11.2 (L) 12.0 - 15.0 g/dL   HCT 95.6 (L) 21.3 - 08.6 %   MCV 92.9 78.0 - 100.0 fL   MCH 30.4 26.0 - 34.0 pg   MCHC 32.7 30.0 - 36.0 g/dL   RDW 57.8 46.9 - 62.9 %   Platelets 293 150 - 400 K/uL  Comprehensive metabolic panel     Status: Abnormal   Collection Time: 10/18/16  4:24 AM  Result Value Ref Range   Sodium 140 135 - 145 mmol/L   Potassium 3.4 (L) 3.5 - 5.1 mmol/L   Chloride 110 101 - 111 mmol/L   CO2 21 (L) 22 - 32 mmol/L   Glucose, Bld 100 (H) 65 - 99 mg/dL   BUN 7 6 - 20 mg/dL   Creatinine, Ser 5.28 0.44 - 1.00 mg/dL   Calcium 8.3 (L) 8.9 - 10.3 mg/dL   Total Protein 6.2 (L) 6.5 - 8.1 g/dL   Albumin 3.2 (L) 3.5 - 5.0 g/dL   AST 14 (L) 15 - 41 U/L   ALT 12 (L) 14 - 54 U/L   Alkaline Phosphatase 34 (L) 38 - 126 U/L    Total Bilirubin 0.9 0.3 - 1.2 mg/dL   GFR calc non Af Amer >60 >  60 mL/min   GFR calc Af Amer >60 >60 mL/min   Anion gap 9 5 - 15      Imaging Results (Last 48 hours)  Mr Maxine Glenn Head Wo Contrast  Result Date: 10/16/2016 CLINICAL DATA:  Acute onset of left facial droop and left hemiplegia. Followup after right MCA thrombectomy. EXAM: MRI HEAD WITHOUT CONTRAST MRA HEAD WITHOUT CONTRAST MRA NECK WITHOUT CONTRAST TECHNIQUE: Multiplanar, multiecho pulse sequences of the brain and surrounding structures were obtained without intravenous contrast. Angiographic images of the Circle of Willis were obtained using MRA technique without intravenous contrast. Angiographic images of the neck were obtained using MRA technique without intravenous contrast. Carotid stenosis measurements (when applicable) are obtained utilizing NASCET criteria, using the distal internal carotid diameter as the denominator. COMPARISON:  Multiple studies 10/15/2016 FINDINGS: MRI HEAD FINDINGS Brain: The study is degraded by a pronounced artifact related to metal in the region of the mouth or jaw. Diffusion imaging shows acute infarction affecting the right caudate and putamen. There is internal petechial hemorrhage but no frank hematoma. Punctate focus of acute infarction at the deep insula. No other more peripheral acute infarction. Elsewhere, the brain shows chronic small-vessel ischemic changes of the cerebral hemispheric white matter. The area of acute infarction shows mild swelling but there is no midline shift. No extra-axial collection. Vascular: Major vessels at the base of the brain show flow. Skull and upper cervical spine: Negative, with limitations by artifact. Sinuses/Orbits: Some fluid in the paranasal sinuses. Limited by artifact. Other: None MRA HEAD FINDINGS Both internal carotid arteries are widely patent through the siphon region. No stenosis. The anterior and middle cerebral vessels are patent without proximal stenosis,  aneurysm or vascular malformation. Successful recanalization of the right middle cerebral artery. Fetal origin of the right PCA. Both vertebral arteries are patent to the basilar. No basilar stenosis. Posterior circulation branch vessels are normal. MRA NECK FINDINGS Very limited by metal artifact. Not much more can be sad other than there is antegrade flow an both carotid and vertebral systems. IMPRESSION: Acute infarction affecting the right putamen and caudate. Petechial blood products without frank hematoma. Mild swelling but no shift. Punctate acute infarction in the deep insula. No other more peripheral acute infarction. Neck MR angiography nondiagnostic because of extensive metal artifact. Intracranial MR angiography shows wide patency of the vessels with re- cannulization of the right MCA. Good distal vessel flow. Electronically Signed   By: Paulina Fusi M.D.   On: 10/16/2016 17:36   Mr Maxine Glenn Neck Wo Contrast  Result Date: 10/16/2016 CLINICAL DATA:  Acute onset of left facial droop and left hemiplegia. Followup after right MCA thrombectomy. EXAM: MRI HEAD WITHOUT CONTRAST MRA HEAD WITHOUT CONTRAST MRA NECK WITHOUT CONTRAST TECHNIQUE: Multiplanar, multiecho pulse sequences of the brain and surrounding structures were obtained without intravenous contrast. Angiographic images of the Circle of Willis were obtained using MRA technique without intravenous contrast. Angiographic images of the neck were obtained using MRA technique without intravenous contrast. Carotid stenosis measurements (when applicable) are obtained utilizing NASCET criteria, using the distal internal carotid diameter as the denominator. COMPARISON:  Multiple studies 10/15/2016 FINDINGS: MRI HEAD FINDINGS Brain: The study is degraded by a pronounced artifact related to metal in the region of the mouth or jaw. Diffusion imaging shows acute infarction affecting the right caudate and putamen. There is internal petechial hemorrhage but no  frank hematoma. Punctate focus of acute infarction at the deep insula. No other more peripheral acute infarction. Elsewhere, the brain shows chronic small-vessel  ischemic changes of the cerebral hemispheric white matter. The area of acute infarction shows mild swelling but there is no midline shift. No extra-axial collection. Vascular: Major vessels at the base of the brain show flow. Skull and upper cervical spine: Negative, with limitations by artifact. Sinuses/Orbits: Some fluid in the paranasal sinuses. Limited by artifact. Other: None MRA HEAD FINDINGS Both internal carotid arteries are widely patent through the siphon region. No stenosis. The anterior and middle cerebral vessels are patent without proximal stenosis, aneurysm or vascular malformation. Successful recanalization of the right middle cerebral artery. Fetal origin of the right PCA. Both vertebral arteries are patent to the basilar. No basilar stenosis. Posterior circulation branch vessels are normal. MRA NECK FINDINGS Very limited by metal artifact. Not much more can be sad other than there is antegrade flow an both carotid and vertebral systems. IMPRESSION: Acute infarction affecting the right putamen and caudate. Petechial blood products without frank hematoma. Mild swelling but no shift. Punctate acute infarction in the deep insula. No other more peripheral acute infarction. Neck MR angiography nondiagnostic because of extensive metal artifact. Intracranial MR angiography shows wide patency of the vessels with re- cannulization of the right MCA. Good distal vessel flow. Electronically Signed   By: Paulina Fusi M.D.   On: 10/16/2016 17:36   Mr Brain Wo Contrast  Result Date: 10/16/2016 CLINICAL DATA:  Acute onset of left facial droop and left hemiplegia. Followup after right MCA thrombectomy. EXAM: MRI HEAD WITHOUT CONTRAST MRA HEAD WITHOUT CONTRAST MRA NECK WITHOUT CONTRAST TECHNIQUE: Multiplanar, multiecho pulse sequences of the brain and  surrounding structures were obtained without intravenous contrast. Angiographic images of the Circle of Willis were obtained using MRA technique without intravenous contrast. Angiographic images of the neck were obtained using MRA technique without intravenous contrast. Carotid stenosis measurements (when applicable) are obtained utilizing NASCET criteria, using the distal internal carotid diameter as the denominator. COMPARISON:  Multiple studies 10/15/2016 FINDINGS: MRI HEAD FINDINGS Brain: The study is degraded by a pronounced artifact related to metal in the region of the mouth or jaw. Diffusion imaging shows acute infarction affecting the right caudate and putamen. There is internal petechial hemorrhage but no frank hematoma. Punctate focus of acute infarction at the deep insula. No other more peripheral acute infarction. Elsewhere, the brain shows chronic small-vessel ischemic changes of the cerebral hemispheric white matter. The area of acute infarction shows mild swelling but there is no midline shift. No extra-axial collection. Vascular: Major vessels at the base of the brain show flow. Skull and upper cervical spine: Negative, with limitations by artifact. Sinuses/Orbits: Some fluid in the paranasal sinuses. Limited by artifact. Other: None MRA HEAD FINDINGS Both internal carotid arteries are widely patent through the siphon region. No stenosis. The anterior and middle cerebral vessels are patent without proximal stenosis, aneurysm or vascular malformation. Successful recanalization of the right middle cerebral artery. Fetal origin of the right PCA. Both vertebral arteries are patent to the basilar. No basilar stenosis. Posterior circulation branch vessels are normal. MRA NECK FINDINGS Very limited by metal artifact. Not much more can be sad other than there is antegrade flow an both carotid and vertebral systems. IMPRESSION: Acute infarction affecting the right putamen and caudate. Petechial blood  products without frank hematoma. Mild swelling but no shift. Punctate acute infarction in the deep insula. No other more peripheral acute infarction. Neck MR angiography nondiagnostic because of extensive metal artifact. Intracranial MR angiography shows wide patency of the vessels with re- cannulization of the right  MCA. Good distal vessel flow. Electronically Signed   By: Paulina FusiMark  Shogry M.D.   On: 10/16/2016 17:36   Dg Chest Port 1 View  Result Date: 10/17/2016 CLINICAL DATA:  76 year old female who presented with right MCA emergent large vessel occlusion. Status post Neuro-endovascular reperfusion. Right basal ganglia infarction. Respiratory failure. Initial encounter. EXAM: PORTABLE CHEST 1 VIEW COMPARISON:  10/16/2016 and earlier. FINDINGS: Portable AP semi upright view at 0654 hours. Mildly rotated to the left. Endotracheal tube tip in good position between the level the clavicles and carina. Enteric tube courses to the abdomen with side hole to level of the gastric body. Mildly improved lung volumes since yesterday. Stable cardiac size and mediastinal contours. No confluent pulmonary opacity. No pneumothorax, pulmonary edema, or pleural effusion. IMPRESSION: 1.  Stable lines and tubes. 2. Mildly improved lung volumes since yesterday who with no convincing acute cardiopulmonary abnormality. Electronically Signed   By: Odessa FlemingH  Hall M.D.   On: 10/17/2016 07:55   Dg Chest Port 1 View  Result Date: 10/16/2016 CLINICAL DATA:  Ventilator EXAM: PORTABLE CHEST 1 VIEW COMPARISON:  10/15/2016 FINDINGS: Endotracheal tube is 3 cm above the carina. NG tube is in the stomach. Bilateral lower lobe airspace opacities, slightly increased. No visible effusions or acute bony abnormality. Heart is borderline in size. IMPRESSION: Increasing bibasilar atelectasis or infiltrates. Electronically Signed   By: Charlett NoseKevin  Dover M.D.   On: 10/16/2016 12:09     Assessment/Plan: Diagnosis: Right putamen caudate and right insular  infarct Labs and images independently reviewed.  Records reviewed and summated above. Stroke: Continue secondary stroke prophylaxis and Risk Factor Modification listed below:   Antiplatelet therapy:   Blood Pressure Management:  Continue current medication with prn's with permisive HTN per primary team Statin Agent:   Diabetes management:   Left sided hemiparesis: fit for orthosis to prevent contractures  Motor recovery: Fluoxetine  1. Does the need for close, 24 hr/day medical supervision in concert with the patient's rehab needs make it unreasonable for this patient to be served in a less intensive setting? Yes  2. Co-Morbidities requiring supervision/potential complications: atrial fibrillation (monitor HR with increased physical activity), history of falls and SDH, diabetes mellitus (Monitor in accordance with exercise and adjust meds as necessary), reported pain due to headaches (Biofeedback training with therapies to help reduce reliance on opiate pain medications, particularly IV morphine, monitor pain control during therapies, and sedation at rest and titrate to maximum efficacy to ensure participation and gains in therapies), hypokalemia (continue to monitor and replete as necessary), leukocytosis (cont to monitor for signs and symptoms of infection, further workup if indicated), ABLA (transfuse if necessary to ensure appropriate perfusion for increased activity tolerance), hypoalbuminemia (maximize nutrition for overall health and wound healing), dysphagia (advance diet as tolerated) 3. Due to safety, disease management, medication administration, pain management and patient education, does the patient require 24 hr/day rehab nursing? Yes 4. Does the patient require coordinated care of a physician, rehab nurse, PT (1-2 hrs/day, 5 days/week), OT (1-2 hrs/day, 5 days/week) and SLP (1-2 hrs/day, 5 days/week) to address physical and functional deficits in the context of the above medical  diagnosis(es)? Yes Addressing deficits in the following areas: balance, endurance, locomotion, strength, transferring, bathing, dressing, toileting, swallowing and psychosocial support 5. Can the patient actively participate in an intensive therapy program of at least 3 hrs of therapy per day at least 5 days per week? Yes 6. The potential for patient to make measurable gains while on inpatient rehab is excellent  7. Anticipated functional outcomes upon discharge from inpatient rehab are modified independent  with PT, modified independent with OT, modified independent with SLP. 8. Estimated rehab length of stay to reach the above functional goals is: 14-18 days. 9. Does the patient have adequate social supports and living environment to accommodate these discharge functional goals? Yes 10. Anticipated D/C setting: Home 11. Anticipated post D/C treatments: HH therapy and Home excercise program 12. Overall Rehab/Functional Prognosis: good  RECOMMENDATIONS: This patient's condition is appropriate for continued rehabilitative care in the following setting: CIR after completion of medical workup.  Patient has agreed to participate in recommended program. Yes Note that insurance prior authorization may be required for reimbursement for recommended care.  Comment: Rehab Admissions Coordinator to follow up.  Charlton Amor., PA-C 10/18/2016  Maryla Morrow, MD, [facility]    Revision History

## 2016-10-19 NOTE — Progress Notes (Signed)
Speech Language Pathology Treatment: Dysphagia  Patient Details Name: [name] MRN: [id number] DOB: 09/26/1941 Today's Date: 10/19/2016 Time: [id number] SLP Time Calculation (min) (ACUTE ONLY): 9 min  Assessment / Plan / Recommendation Clinical Impression  Pt seen briefly for swallow intervention (CIR rehab coordinator present speaking to dtr re: probable admission today). Daughter translated for this therapist as well and reported pt required cues to clear oral cavity and slow down when eating. Consumption of honey thick and puree with SLP was unremarkable. Daughter very attentive, comprehends and cues pt appropriately. Continue Dys 1, honey thick for now and continue on CIR for safety and ability to upgrade. SLE planned to request speech-cognitive order however ST on CIR can address.     HPI HPI: 76 y.o.femalewith history of diabetes mellitus, subdural hematoma in 2015, previous pneumonia, obstructive sleep apnea on C Pap, dyslipidemia, and atrial fibrillation not anticoagulated secondary to subdural hematoma history presenting with left facial droop, left hemiplegia, and right gaze deviation. 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      SLP Plan  Continue with current plan of care     Recommendations  Diet recommendations: Dysphagia 1 (puree);Honey-thick liquid Liquids provided via: Cup;No straw Medication Administration: Crushed with puree Supervision: Patient able to self feed;Full supervision/cueing for compensatory strategies Compensations: Slow rate;Small sips/bites;Lingual sweep for clearance of pocketing Postural Changes and/or Swallow Maneuvers: Seated upright 90 degrees                General recommendations: Rehab consult Oral Care Recommendations: Oral care BID Follow up Recommendations: Inpatient Rehab Plan: Continue with current plan of care       GO                Royce MacadamiaLitaker, Nehan Flaum  [name] 10/19/2016, 11:35 AM  Breck CoonsLisa [name] Lonell FaceLitaker M.Ed [employer]-SLP Pager [phone]

## 2016-10-19 NOTE — Progress Notes (Signed)
Report given to rn receiving patient at [phone].

## 2016-10-19 NOTE — Progress Notes (Signed)
Occupational Therapy Treatment Patient Details Name: [name] MRN: [id number] DOB: 07/02/1941 Today's Date: 10/19/2016    History of present illness 76 year old female w/ sig h/o Afib, type II DM. Was not on anticoagulation d/t h/o falls and SDH. Was in usual state of health until 12/30 am. Per family she was feeling "tired". Went to bed at 0900. At 0930 found by family w/ left sided weakness and facial droop. Transferred to [facility] ER. Code stroke initiated. CT head c/w: hyperdense right MCA bifurcation highly suspicious   OT comments  Pt with no family present this session. Pt demonstrates decr safety awareness with RW use. Pt rather use hand held (A) and needs cues to attend to the L side. Pt remains good CIR candidate at this time.  Follow Up Recommendations  CIR;Supervision/Assistance - 24 hour    Equipment Recommendations  Other (comment)    Recommendations for Other Services Rehab consult    Precautions / Restrictions Precautions Precautions: Fall       Mobility Bed Mobility               General bed mobility comments: sitting eob on arrival requesting bathroom  Transfers Overall transfer level: Needs assistance Equipment used: 1 person hand held assist Transfers: Sit to/from Stand Sit to Stand: Min assist         General transfer comment: cues for safety    Balance Overall balance assessment: Needs assistance Sitting-balance support: Bilateral upper extremity supported;Feet supported Sitting balance-Leahy Scale: Fair     Standing balance support: Bilateral upper extremity supported;During functional activity Standing balance-Leahy Scale: Poor Standing balance comment: reaching for environmental supports                   ADL Overall ADL's : Needs assistance/impaired     Grooming: Minimal assistance;Standing Grooming Details (indicate cue type and reason): washing hands and abandoned RW                 Toilet Transfer: Minimal  assistance;Ambulation;Regular Toilet   Toileting- Architect and Hygiene: Min guard;Sitting/lateral lean       Functional mobility during ADLs: Minimal assistance General ADL Comments: pt needs cues for safety with RW. pt abandons Rw all together during session . Pt needs cues to look L       Vision                     Perception     Praxis      Cognition   Behavior During Therapy: Flat affect                         Extremity/Trunk Assessment               Exercises     Shoulder Instructions       General Comments      Pertinent Vitals/ Pain       Pain Assessment: No/denies pain Faces Pain Scale: Hurts a little bit Pain Intervention(s): Monitored during session  Home Living                                          Prior Functioning/Environment              Frequency  Min 2X/week        Progress Toward Goals  OT Goals(current  goals can now be found in the care plan section)  Progress towards OT goals: Progressing toward goals  Acute Rehab OT Goals Patient Stated Goal: get back to being independent OT Goal Formulation: With patient/family Time For Goal Achievement: 10/31/16 Potential to Achieve Goals: Good ADL Goals Pt Will Perform Eating: with set-up;sitting Pt Will Perform Grooming: with supervision;standing Pt Will Perform Upper Body Bathing: with supervision;sitting Pt Will Perform Lower Body Bathing: with supervision;sit to/from stand Pt Will Transfer to Toilet: with supervision;ambulating;regular height toilet Pt Will Perform Toileting - Clothing Manipulation and hygiene: with supervision;sit to/from stand  Plan Discharge plan remains appropriate    Co-evaluation                 End of Session Equipment Utilized During Treatment: Gait belt;Rolling walker   Activity Tolerance Patient tolerated treatment well   Patient Left Other (comment) (hallway with PT Chari ManningBecca)   Nurse  Communication Mobility status;Precautions        Time: [id number] OT Time Calculation (min): 10 min  Charges: OT General Charges $OT Visit: 1 Procedure OT Treatments $Self Care/Home Management : 8-22 mins  Boone MasterJones, Kiano Terrien B 10/19/2016, 2:17 PM    Mateo FlowJones, Brynn   OTR/L Pager: [phone] Office: [phone] .

## 2016-10-19 NOTE — Progress Notes (Signed)
Inpatient Rehabilitation  I met with the patient and her daughter at the bedside to discuss the recommendation for IP Rehab.  I provided informational booklets and answered daughter's questions.  Daughter has requested to provide interpretation for her mother and pt. is agreeable.  Dr. Erlinda Weekly has given medical clearance for pt. to admit to CIR today.  Pt. and daughter are pleased and  agreeable.  I have updated Jacqualin Combes, RNCM and pt's RN Claiborne Billings of the plan.  Will arrange for admission later today.  Please call if questions.  [place] Admissions Coordinator Cell [phone] Office [phone]

## 2016-10-19 NOTE — Progress Notes (Signed)
Patient ID: [name], female   DOB: 11/30/1940, 76 y.o.   MRN: [id number] Patient arrived from [facility] with RN and patient belongings. Daughter arrived later and oriented to fall prevention plan, rehab safety plan, rehab schedule, health resource notebook, and rehab process. Patient has no complaints of pain. Daughter plans to stay the night with the patient.

## 2016-10-19 NOTE — Care Management Note (Signed)
Case Management Note  Patient Details  Name: [name] MRN: [id number] Date of Birth: 10/16/1941  Subjective/Objective:   Pt admitted with CVA. She is from home with her family.                  Action/Plan: Plan is for patient to admit to CIR today. No further needs per CM.   Expected Discharge Date:                  Expected Discharge Plan:  IP Rehab Facility  In-House Referral:     Discharge planning Services     Post Acute Care Choice:    Choice offered to:     DME Arranged:    DME Agency:     HH Arranged:    HH Agency:     Status of Service:  Completed, signed off  If discussed at [employer] Length of Stay Meetings, dates discussed:    Additional Comments:  Kermit BaloKelli F Michoel Kunin, RN 10/19/2016, 11:53 AM

## 2016-10-19 NOTE — Care Management Important Message (Signed)
Important Message  Patient Details  Name: [name] MRN: [id number] Date of Birth: 03/08/1941   Medicare Important Message Given:  Yes    Tineka Uriegas 10/19/2016, 11:11 AM

## 2016-10-19 NOTE — Progress Notes (Signed)
Physical Therapy Treatment Patient Details Name: [name] MRN: [id number] DOB: 06/04/1941 Today's Date: 10/19/2016    History of Present Illness 76 year old female w/ sig h/o Afib, type II DM. Was not on anticoagulation d/t h/o falls and SDH. Was in usual state of health until 12/30 am. Per family she was feeling "tired". Went to bed at 0900. At 0930 found by family w/ left sided weakness and facial droop. [facility]. Code stroke initiated. CT head c/w: hyperdense right MCA bifurcation highly suspicious    PT Comments    Pt is progressing with gait.  Her pattern was better today with a longer step length on the right.  She still has a tendency to pitch forward ahead of her COG/BOS requiring min assist with gait and stairs for balance and safety.  She is starting to use her left hand more today as well.  She is scheduled to d/c to CIR.  PT to follow acutely until d/c confirmed.     Follow Up Recommendations  CIR;Supervision - Intermittent     Equipment Recommendations  None recommended by PT    Recommendations for Other Services   NA     Precautions / Restrictions Precautions Precautions: Fall Precaution Comments: left inattention    Mobility  Bed Mobility               General bed mobility comments: Pt OOB coming out of bathroom with OT  Transfers Overall transfer level: Needs assistance Equipment used: 1 person hand held assist Transfers: Sit to/from Stand Sit to Stand: Min assist         General transfer comment: Min hand held assist to support pt during transition to sit down.   Ambulation/Gait Ambulation/Gait assistance: Min assist Ambulation Distance (Feet): 180 Feet Assistive device: 1 person hand held assist Gait Pattern/deviations: Step-through pattern;Decreased step length - left (pitches forward at times) Gait velocity: decreased Gait velocity interpretation: Below normal speed for age/gender General Gait Details: Pt with better ability to  maintain longer step length with visual cues.  Pt also reaching (not always succesfully) with left hand for hallway railing.  Min assist for balance as pt pitches anteriorly at times. Also, RN reports monitor states HR is 140 during gait.    Stairs Stairs: Yes   Stair Management: Two rails;Alternating pattern;Forwards Number of Stairs: 2 General stair comments: Heavy min assist for safety and balance.  Pt going reciprocally and putting her weaker leg up first.     Modified Rankin (Stroke Patients Only) Modified Rankin (Stroke Patients Only) Pre-Morbid Rankin Score: No significant disability Modified Rankin: Moderately severe disability     Balance Overall balance assessment: Needs assistance Sitting-balance support: Feet supported;No upper extremity supported Sitting balance-Leahy Scale: Good Sitting balance - Comments: Pt sat EOB and reached down to get paper towel up off of floor with supervision   Standing balance support: No upper extremity supported;Single extremity supported Standing balance-Leahy Scale: Fair Standing balance comment: Pt with one hand supported reached down to get something off of the floor in standing.                     Cognition Arousal/Alertness: Awake/alert Behavior During Therapy: Flat affect Overall Cognitive Status: Difficult to assess                             Pertinent Vitals/Pain Pain Assessment: Faces Pain Score: 0-No pain Faces Pain Scale: Hurts  a little bit Pain Intervention(s): Monitored during session           PT Goals (current goals can now be found in the care plan section) Acute Rehab PT Goals Patient Stated Goal: none stated today Progress towards PT goals: Progressing toward goals    Frequency    Min 4X/week      PT Plan Current plan remains appropriate       End of Session Equipment Utilized During Treatment: Gait belt Activity Tolerance: Patient limited by fatigue;Other (comment) (limited  by high HR during gait) Patient left: in bed;with call bell/phone within reach;with bed alarm set     Time: 1410-1419 PT Time Calculation (min) (ACUTE ONLY): 9 min  Charges:  $Gait Training: 8-22 mins                      Brantleigh Mifflin B. Shone Leventhal, PT, DPT [phone]   10/19/2016, 3:13 PM

## 2016-10-19 NOTE — H&P (Signed)
Physical Medicine and Rehabilitation Admission H&P    Chief Complaint  Patient presents with  . Code Stroke  : HPI: [name] is a 76 y.o. right handed female with history of atrial fibrillation on no anticoagulation due to history of falls and SDH, diabetes mellitus.  History taken from chart review and daughter, previously. Presented 10/15/2016 with left-sided weakness and facial droop.. Patient lives with daughter and son-in-law who work. She does have another daughter who checks on her but cannot provide 24-hour care. Reported to be independent with assistive device prior to admission. Two-level home with bedroom on first floor and 3 steps to entry. Cranial CT scan showed positive hyperdense right MCA bifurcation suspicious for emergent large vessel occlusion. No intracranial hemorrhage. CT cerebral perfusion scan positive for emergent large vessel occlusion distal right M1 or right MCA bifurcation. Cerebral angiogram and underwent complete revascularization of occluded right MCA M1 segment per interventional radiology. 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 Petechial blood products without frank hematoma. Mild swelling without shift. MRA of the head 10/16/2016 wide patency of the vessels with recannulization of the right MCA. Echocardiogram with ejection fraction of 70% no wall motion abnormalities. Carotid Dopplers With no ICA stenosis. Patient remained ventilatory support extubated 10/17/2016.  Aspirin for CVA prophylaxis and subcutaneous Lovenox for DVT prophylaxis. Await plan for possible Eliquis 5 mg twice a day 14 days after stroke prevention due to hemorrhagic transformation. Modified barium swallow completed presently on a dysphagia #1 honey thick liquid diet. Physical and occupational therapy evaluations completed 10/17/2016 with recommendations of physical medicine rehabilitation consult.Patient was admitted for comprehensive  rehabilitation program  ROS  Unable to perform ROS: Language    Past Medical History:  Diagnosis Date  . Atrial fibrillation ([place])   . Chronic back pain    "mid back down into lower back" (08/25/2014)  . GERD (gastroesophageal reflux disease)   . Hypertriglyceridemia   . OSA on CPAP   . Osteoarthritis    "knees, hands, back" (08/25/2014)  . Pneumonia ~ 2000 X 1  . Subdural hematoma ([place]) july 2015   S/P fall while on Coumadin  . T12 compression fracture ([place]) 2012  . Type II diabetes mellitus ([place])    Past Surgical History:  Procedure Laterality Date  . APPENDECTOMY  2012  . CATARACT EXTRACTION W/ INTRAOCULAR LENS  IMPLANT, BILATERAL Bilateral 2000's  . IR GENERIC HISTORICAL  10/15/2016   IR PERCUTANEOUS ART THROMBECTOMY/INFUSION INTRACRANIAL INC DIAG ANGIO 10/15/2016 [name], MD [facility]INTER[facility]  . RADIOLOGY WITH ANESTHESIA N/A 10/15/2016   Procedure: RADIOLOGY WITH ANESTHESIA;  Surgeon: [name], MD;  Location: [facility];  Service: Radiology;  Laterality: N/A;  . TOTAL ABDOMINAL HYSTERECTOMY  1990   Family History  Problem Relation Age of Onset  . Family history unknown: Yes   Social History:  reports that she is a non-smoker but has been exposed to tobacco smoke. She has never used smokeless tobacco. She reports that she does not drink alcohol or use drugs. Allergies:  Allergies  Allergen Reactions  . Banana Swelling    Hands and feet  . Chocolate Itching  . Peanut-Containing Drug Products Itching    Throat itches, no swelling   Medications Prior to Admission  Medication Sig Dispense Refill  . albuterol (ACCUNEB) 0.63 MG/3ML nebulizer solution Take 0.63 mg by nebulization every 6 (six) hours as needed. When unable to use inaler    . alendronate (FOSAMAX) 70 MG tablet  Take 70 mg by mouth every Monday.     [name] aspirin EC 81 MG tablet Take 81 mg by mouth daily.    [name] azithromycin (ZITHROMAX) 250 MG tablet Take 250-500 mg by mouth See admin instructions. Pt  takes 549m on first day 12/28, then 2535mdaily therafter    . b complex vitamins tablet Take 1 tablet by mouth daily.    . Calcium-Vitamin D (CALTRATE 600 PLUS-VIT D PO) Take 1 tablet by mouth 2 (two) times daily.    . chlorpheniramine-HYDROcodone (TUSSIONEX PENNKINETIC ER) 10-8 MG/5ML LQCR Take 5 mLs by mouth every 12 (twelve) hours as needed for cough. (Patient taking differently: Take 2.5-5 mLs by mouth every 12 (twelve) hours as needed for cough. ) 115 mL 0  . fluticasone (FLONASE) 50 MCG/ACT nasal spray Place 1 spray into both nostrils daily as needed for allergies.     . fluticasone (FLOVENT HFA) 110 MCG/ACT inhaler Inhale 1 puff into the lungs 2 (two) times daily.    . Marland KitchenYDROcodone-acetaminophen (NORCO/VICODIN) 5-325 MG per tablet Take 1 tablet by mouth every 6 (six) hours as needed for severe pain. 30 tablet 0  . Krill Oil 1000 MG CAPS Take 1,000 mg by mouth 2 (two) times daily.     . Marland Kitchenevalbuterol (XOPENEX HFA) 45 MCG/ACT inhaler Inhale 1 puff into the lungs every 4 (four) hours as needed.    . metFORMIN (GLUCOPHAGE-XR) 500 MG 24 hr tablet Take 500 mg by mouth 2 (two) times daily.     . metoprolol tartrate (LOPRESSOR) 25 MG tablet Take 12.5 mg by mouth 2 (two) times daily.    . montelukast (SINGULAIR) 10 MG tablet Take 10 mg by mouth at bedtime.   12  . Multiple Vitamin (DAILY MULTIVITAMIN PO) Take 1 tablet by mouth daily.    . pravastatin (PRAVACHOL) 10 MG tablet Take 10 mg by mouth daily.    . traMADol (ULTRAM) 50 MG tablet Take 1 tablet (50 mg total) by mouth 2 (two) times daily. (Patient taking differently: Take 50 mg by mouth 2 (two) times daily as needed. ) 30 tablet 0  . albuterol (PROVENTIL HFA;VENTOLIN HFA) 108 (90 BASE) MCG/ACT inhaler Inhale 2 puffs into the lungs every 6 (six) hours as needed for wheezing or shortness of breath. (Patient not taking: Reported on 10/16/2016) 1 Inhaler 2  . bacitracin ointment Apply 1 application topically 2 (two) times daily. (Patient not taking:  Reported on 10/16/2016) 120 g 0  . docusate sodium 100 MG CAPS Take 100 mg by mouth 2 (two) times daily. (Patient not taking: Reported on 10/16/2016) 10 capsule 0  . levofloxacin (LEVAQUIN) 750 MG tablet Take 1 tablet (750 mg total) by mouth daily. (Patient not taking: Reported on 10/16/2016) 6 tablet 0    Home: Home Living Family/patient expects to be discharged to:: Private residence Living Arrangements: Children Available Help at Discharge: Family, Available PRN/intermittently (lives with daughter who works, sister can come by) Type of Home: House Home Access: Stairs to enter EnTechnical brewerf Steps: 3 Entrance Stairs-Rails: Right Home Layout: Two level, Able to live on main level with bedroom/bathroom Alternate Level Stairs-Number of Steps: 12 Alternate Level Stairs-Rails: Right Bathroom Shower/Tub: TuChiropodistStandard Bathroom Accessibility: Yes Home Equipment: WaGilford Rile standard, Cane - single point   Functional History: Prior Function Level of Independence: Independent with assistive device(s), Needs assistance Gait / Transfers Assistance Needed: PTA, used cane intermittently per daughter ADL's / Homemaking Assistance Needed: I with ADLs  Functional Status:  Mobility: Bed Mobility Overal bed mobility: Needs Assistance Bed Mobility: Supine to Sit Supine to sit: Min assist, HOB elevated General bed mobility comments: Assist for moving LEs off bed, use of rail. Increased time.  Transfers Overall transfer level: Needs assistance Equipment used: 1 person hand held assist Transfers: Sit to/from Stand Sit to Stand: Min assist General transfer comment: Assist to steady in standing. Mild posterior lean noted.  Ambulation/Gait Ambulation/Gait assistance: Min assist, +2 safety/equipment Ambulation Distance (Feet): 50 Feet (+50') Assistive device: 1 person hand held assist Gait Pattern/deviations: Step-to pattern, Decreased step length - right,  Decreased step length - left, Decreased stride length, Shuffle, Drifts right/left, Trunk flexed, Wide base of support General Gait Details: Short shuffling steps noted initially but with cues, able to increase step length LLE but when fatigued reverts back to short step lengths. Pt with forward momentum requiring assist to prevent LOB anteriorly. Constant Min A for balance due to unsteadiness and drifting.  Gait velocity: decreased Gait velocity interpretation: Below normal speed for age/gender    ADL: ADL Overall ADL's : Needs assistance/impaired Eating/Feeding: Minimal assistance, Sitting Grooming: Moderate assistance, Sitting Upper Body Bathing: Moderate assistance, Sitting Lower Body Bathing: Moderate assistance, Sit to/from stand Upper Body Dressing : Minimal assistance, Sitting Lower Body Dressing: Moderate assistance, Sit to/from stand Lower Body Dressing Details (indicate cue type and reason): Pt able to don socks sitting EOB with min assist Toilet Transfer: Minimal assistance, +2 for physical assistance, Ambulation, Regular Toilet, Grab bars Toileting- Clothing Manipulation and Hygiene: Min guard, Sitting/lateral lean Toileting - Clothing Manipulation Details (indicate cue type and reason): for peri care only Functional mobility during ADLs: Minimal assistance, +2 for physical assistance General ADL Comments: Educated pt and daughter on functional use of LUE and continued ROM throughout the day.  Cognition: Cognition Overall Cognitive Status: Within Functional Limits for tasks assessed Orientation Level: Oriented X4 Cognition Arousal/Alertness: Awake/alert Behavior During Therapy: Flat affect Overall Cognitive Status: Within Functional Limits for tasks assessed Area of Impairment: Following commands, Safety/judgement, Awareness, Problem solving Following Commands: Follows one step commands with increased time Safety/Judgement: Decreased awareness of safety, Decreased  awareness of deficits Problem Solving: Difficulty sequencing, Requires verbal cues, Requires tactile cues General Comments: Pt with left inattention but seems improved from prior session. Difficult to fully assess due to language barrier.   Physical Exam: Blood pressure 128/73, pulse 88, temperature 97.8 F (36.6 C), temperature source Oral, resp. rate 20, height _0  (1.473 m), weight 61.7 kg (136 lb 0.4 oz), SpO2 99 %. Physical Exam  Vitals reviewed. Constitutional: She appears well-developed.  Obese  HENT:  Head: Normocephalic and atraumatic.  Eyes: Conjunctivae and EOM are normal.  Neck: Normal range of motion. Neck supple. No thyromegaly present.  Cardiovascular:  Irregular irregular  Respiratory: Effort normal and breath sounds normal. No respiratory distress. She has no wheezes.  GI: Soft. Bowel sounds are normal. She exhibits no distension.  Musculoskeletal: She exhibits no edema or tenderness.  Neurological: She is alert.  Exam limited by language barrier.  She does follow simple demonstrated commands. Unable to assess orientation Motor: RUE: 4+/5 proximal to distal LUE: 4/5 proximal to distal RLE: 4/5 (pain inhibition - baseline) LLE: 4/5 proximal to distal Sensation intact to light touch DTRs symmetric    Skin: Skin is warm and dry.  Psychiatric:  Unable to assess due to language   Results for orders placed or performed during the hospital encounter of 10/15/16 (from the past 48 hour(s))  Glucose, capillary  Status: Abnormal   Collection Time: 10/17/16  4:41 PM  Result Value Ref Range   Glucose-Capillary 112 (H) 65 - 99 mg/dL  Glucose, capillary     Status: None   Collection Time: 10/17/16  9:02 PM  Result Value Ref Range   Glucose-Capillary 97 65 - 99 mg/dL   Comment 1 Notify RN    Comment 2 Document in Chart   CBC     Status: Abnormal   Collection Time: 10/18/16  4:24 AM  Result Value Ref Range   WBC 10.7 (H) 4.0 - 10.5 K/uL   RBC 3.68 (L) 3.87 -  5.11 MIL/uL   Hemoglobin 11.2 (L) 12.0 - 15.0 g/dL   HCT 34.2 (L) 36.0 - 46.0 %   MCV 92.9 78.0 - 100.0 fL   MCH 30.4 26.0 - 34.0 pg   MCHC 32.7 30.0 - 36.0 g/dL   RDW 12.1 11.5 - 15.5 %   Platelets 293 150 - 400 K/uL  Comprehensive metabolic panel     Status: Abnormal   Collection Time: 10/18/16  4:24 AM  Result Value Ref Range   Sodium 140 135 - 145 mmol/L   Potassium 3.4 (L) 3.5 - 5.1 mmol/L   Chloride 110 101 - 111 mmol/L   CO2 21 (L) 22 - 32 mmol/L   Glucose, Bld 100 (H) 65 - 99 mg/dL   BUN 7 6 - 20 mg/dL   Creatinine, Ser 0.63 0.44 - 1.00 mg/dL   Calcium 8.3 (L) 8.9 - 10.3 mg/dL   Total Protein 6.2 (L) 6.5 - 8.1 g/dL   Albumin 3.2 (L) 3.5 - 5.0 g/dL   AST 14 (L) 15 - 41 U/L   ALT 12 (L) 14 - 54 U/L   Alkaline Phosphatase 34 (L) 38 - 126 U/L   Total Bilirubin 0.9 0.3 - 1.2 mg/dL   GFR calc non Af Amer >60 >60 mL/min   GFR calc Af Amer >60 >60 mL/min    Comment: (NOTE) The eGFR has been calculated using the CKD EPI equation. This calculation has not been validated in all clinical situations. eGFR's persistently <60 mL/min signify possible Chronic Kidney Disease.    Anion gap 9 5 - 15  Glucose, capillary     Status: None   Collection Time: 10/18/16  8:14 AM  Result Value Ref Range   Glucose-Capillary 90 65 - 99 mg/dL   Comment 1 Notify RN    Comment 2 Document in Chart   Glucose, capillary     Status: None   Collection Time: 10/18/16 12:16 PM  Result Value Ref Range   Glucose-Capillary 73 65 - 99 mg/dL  Glucose, capillary     Status: Abnormal   Collection Time: 10/18/16  5:06 PM  Result Value Ref Range   Glucose-Capillary 100 (H) 65 - 99 mg/dL  Glucose, capillary     Status: Abnormal   Collection Time: 10/18/16  9:31 PM  Result Value Ref Range   Glucose-Capillary 116 (H) 65 - 99 mg/dL   Comment 1 Notify RN    Comment 2 Document in Chart   CBC     Status: None   Collection Time: 10/19/16  4:51 AM  Result Value Ref Range   WBC 10.0 4.0 - 10.5 K/uL   RBC 4.02  3.87 - 5.11 MIL/uL   Hemoglobin 12.5 12.0 - 15.0 g/dL   HCT 37.0 36.0 - 46.0 %   MCV 92.0 78.0 - 100.0 fL   MCH 31.1 26.0 - 34.0 pg  MCHC 33.8 30.0 - 36.0 g/dL   RDW 12.1 11.5 - 15.5 %   Platelets 316 150 - 400 K/uL  Basic metabolic panel     Status: Abnormal   Collection Time: 10/19/16  4:51 AM  Result Value Ref Range   Sodium 140 135 - 145 mmol/L   Potassium 3.7 3.5 - 5.1 mmol/L   Chloride 108 101 - 111 mmol/L   CO2 20 (L) 22 - 32 mmol/L   Glucose, Bld 116 (H) 65 - 99 mg/dL   BUN 8 6 - 20 mg/dL   Creatinine, Ser 0.54 0.44 - 1.00 mg/dL   Calcium 8.9 8.9 - 10.3 mg/dL   GFR calc non Af Amer >60 >60 mL/min   GFR calc Af Amer >60 >60 mL/min    Comment: (NOTE) The eGFR has been calculated using the CKD EPI equation. This calculation has not been validated in all clinical situations. eGFR's persistently <60 mL/min signify possible Chronic Kidney Disease.    Anion gap 12 5 - 15  Glucose, capillary     Status: Abnormal   Collection Time: 10/19/16  6:13 AM  Result Value Ref Range   Glucose-Capillary 106 (H) 65 - 99 mg/dL   Comment 1 Notify RN    Comment 2 Document in Chart    Dg Swallowing Func-speech Pathology  Result Date: 10/18/2016 Objective Swallowing Evaluation: Type of Study: MBS-Modified Barium Swallow Study Patient Details Name: Dorena Dorfman MRN: [id number] Date of Birth: 08/03/1941 Today's Date: 10/18/2016 Time: SLP Start Time (ACUTE ONLY): 1330-SLP Stop Time (ACUTE ONLY): 1345 SLP Time Calculation (min) (ACUTE ONLY): 15 min Past Medical History: Past Medical History: Diagnosis Date . Atrial fibrillation ([place])  . Chronic back pain   "mid back down into lower back" (08/25/2014) . GERD (gastroesophageal reflux disease)  . Hypertriglyceridemia  . OSA on CPAP  . Osteoarthritis   "knees, hands, back" (08/25/2014) . Pneumonia ~ 2000 X 1 . Subdural hematoma ([place]) july 2015  S/P fall while on Coumadin . T12 compression fracture ([place]) 2012 . Type II diabetes mellitus ([place])  Past Surgical History:  Past Surgical History: Procedure Laterality Date . APPENDECTOMY  2012 . CATARACT EXTRACTION W/ INTRAOCULAR LENS  IMPLANT, BILATERAL Bilateral 2000's . RADIOLOGY WITH ANESTHESIA N/A 10/15/2016  Procedure: RADIOLOGY WITH ANESTHESIA;  Surgeon: [name], MD;  Location: [facility];  Service: Radiology;  Laterality: N/A; . TOTAL ABDOMINAL HYSTERECTOMY  1990 HPI: 76 y.o.femalewith history of diabetes mellitus, subdural hematoma in 2015, previous pneumonia, obstructive sleep apnea on C Pap, dyslipidemia, and atrial fibrillation not anticoagulated secondary to subdural hematoma history presenting with left facial droop, left hemiplegia, and right gaze deviation. 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 Subjective: alert, dtr present for translation Assessment / Plan / Recommendation CHL IP CLINICAL IMPRESSIONS 10/18/2016 Therapy Diagnosis Mild oral phase dysphagia;Mild pharyngeal phase dysphagia Clinical Impression Pt presents with a mild-moderate oropharyngeal dysphagia marked by decreased oral control/manipulation with barium retention/spillage left anterior oral cavity; premature bolus loss for all consistencies, to level of pyriforms with liquids; consistent penetration of liquids (thin and nectar-thick), fewer incidents of penetration with honey-thick.  Chin tuck did not improve airway protection.  There was consistent vallecular residue.  There was no observed aspiration, but given presence of pharyngeal residue post-swallow, consistent penetration, and sensory deficits, recommend initiating a ocnservative diet of dysphagia 1, honey-thick liquids.  SLP will follow for safety/diet progression.  Pt's daughter was present for study and assisted with translating results/recs to  her mother.  Impact on safety and function Moderate aspiration risk   CHL IP TREATMENT RECOMMENDATION 10/18/2016 Treatment Recommendations Therapy as outlined in  treatment plan below   Prognosis 10/18/2016 Prognosis for Safe Diet Advancement Good Barriers to Reach Goals -- Barriers/Prognosis Comment -- CHL IP DIET RECOMMENDATION 10/18/2016 SLP Diet Recommendations Dysphagia 1 (Puree) solids;Honey thick liquids Liquid Administration via Cup Medication Administration Crushed with puree Compensations Slow rate;Small sips/bites Postural Changes Seated upright at 90 degrees   CHL IP OTHER RECOMMENDATIONS 10/18/2016 Recommended Consults -- Oral Care Recommendations Oral care BID Other Recommendations Order thickener from pharmacy   CHL IP FOLLOW UP RECOMMENDATIONS 10/18/2016 Follow up Recommendations Inpatient Rehab   CHL IP FREQUENCY AND DURATION 10/18/2016 Speech Therapy Frequency (ACUTE ONLY) min 2x/week Treatment Duration 2 weeks      CHL IP ORAL PHASE 10/18/2016 Oral Phase Impaired Oral - Pudding Teaspoon -- Oral - Pudding Cup -- Oral - Honey Teaspoon -- Oral - Honey Cup Weak lingual manipulation;Decreased bolus cohesion;Premature spillage Oral - Nectar Teaspoon -- Oral - Nectar Cup Weak lingual manipulation;Decreased bolus cohesion;Premature spillage;Left anterior bolus loss Oral - Nectar Straw -- Oral - Thin Teaspoon -- Oral - Thin Cup Weak lingual manipulation;Decreased bolus cohesion;Premature spillage;Left anterior bolus loss Oral - Thin Straw -- Oral - Puree Weak lingual manipulation;Decreased bolus cohesion;Premature spillage Oral - Mech Soft -- Oral - Regular -- Oral - Multi-Consistency -- Oral - Pill -- Oral Phase - Comment --  CHL IP PHARYNGEAL PHASE 10/18/2016 Pharyngeal Phase Impaired Pharyngeal- Pudding Teaspoon -- Pharyngeal -- Pharyngeal- Pudding Cup -- Pharyngeal -- Pharyngeal- Honey Teaspoon -- Pharyngeal -- Pharyngeal- Honey Cup Reduced tongue base retraction;Pharyngeal residue - valleculae Pharyngeal -- Pharyngeal- Nectar Teaspoon -- Pharyngeal -- Pharyngeal- Nectar Cup Reduced tongue base retraction;Pharyngeal residue - valleculae;Penetration/Aspiration during  swallow;Reduced airway/laryngeal closure Pharyngeal Material enters airway, remains ABOVE vocal cords and not ejected out Pharyngeal- Nectar Straw -- Pharyngeal -- Pharyngeal- Thin Teaspoon -- Pharyngeal -- Pharyngeal- Thin Cup Reduced airway/laryngeal closure;Penetration/Aspiration during swallow;Reduced tongue base retraction;Pharyngeal residue - valleculae Pharyngeal Material enters airway, remains ABOVE vocal cords and not ejected out Pharyngeal- Thin Straw -- Pharyngeal -- Pharyngeal- Puree -- Pharyngeal -- Pharyngeal- Mechanical Soft -- Pharyngeal -- Pharyngeal- Regular -- Pharyngeal -- Pharyngeal- Multi-consistency -- Pharyngeal -- Pharyngeal- Pill -- Pharyngeal -- Pharyngeal Comment --  CHL IP CERVICAL ESOPHAGEAL PHASE 10/18/2016 Cervical Esophageal Phase Impaired Pudding Teaspoon -- Pudding Cup -- Honey Teaspoon -- Honey Cup -- Nectar Teaspoon -- Nectar Cup -- Nectar Straw -- Thin Teaspoon -- Thin Cup -- Thin Straw -- Puree -- Mechanical Soft -- Regular -- Multi-consistency -- Pill -- Cervical Esophageal Comment -- No flowsheet data found. Juan Quam [name] 10/18/2016, 2:19 PM              Dg Chest 1 View  Result Date: 10/18/2016 CLINICAL DATA:  Chest pain EXAM: CHEST 1 VIEW COMPARISON:  October 17, 2016 FINDINGS: Endotracheal tube and nasogastric tube have been removed. No pneumothorax. There is atelectatic change in the right upper lobe and left base regions. Lungs elsewhere are clear. Heart size and pulmonary vascularity are normal. No adenopathy. There is atherosclerotic calcification in the aorta. No bone lesions. IMPRESSION: No pneumothorax. Areas of atelectasis. Early pneumonia in the right upper lobe and left base regions cannot be excluded radiographically. Stable cardiac silhouette. There is aortic atherosclerosis. Electronically Signed   By: Lowella Grip III M.D.   On: 10/18/2016 13:52    Medical Problem List and Plan: 1.  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  DVT Prophylaxis/Anticoagulation: Subcutaneous Lovenox. Monitor for any bleeding episodes 3. Pain Management/headaches: Fioricet as needed 4. Dysphagia. Dysphagia #1 Honey thick liquid diet. Monitor hydration. Follow-up speech therapy 5. Neuropsych: This patient is capable of making decisions on her own behalf. 6. Skin/Wound Care: Routine skin checks 7. Fluids/Electrolytes/Nutrition: Routine I&O with follow-up chemistries 8. Diabetes mellitus peripheral neuropathy. Hemoglobin A1c 6.1.SSI. Check blood sugars before meals and at bedtime. Patient on Glucophage 500 mg twice a day prior to admission and resume as needed 9. Atrial fibrillation. Patient on aspirin 81 mg daily prior to admission and monitor closely for history of falls with SDH. Cardiac rate controlled. Await possible plan to begin Eliquis 5 mg twice a day 14 days after stroke   Post Admission Physician Evaluation: 1. Preadmission assessment reviewed and changes made below. 2. Functional deficits secondary  to right putamen caudate and right insular infarct. 3. Patient is admitted to receive collaborative, interdisciplinary care between the physiatrist, rehab nursing staff, and therapy team. 4. Patient's level of medical complexity and substantial therapy needs in context of that medical necessity cannot be provided at a lesser intensity of care such as a SNF. 5. Patient has experienced substantial functional loss from his/her baseline which was documented above under the "Functional History" and "Functional Status" headings.  Judging by the patient's diagnosis, physical exam, and functional history, the patient has potential for functional progress which will result in measurable gains while on inpatient rehab.  These gains will be of substantial and practical use upon discharge  in facilitating mobility and self-care at the household  level. 6. Physiatrist will provide 24 hour management of medical needs as well as oversight of the therapy plan/treatment and provide guidance as appropriate regarding the interaction of the two. 7. The Preadmission Screening has been reviewed and patient status is unchanged unless otherwise stated above. 8. 24 hour rehab nursing will assist with safety, skin/wound care, disease management and patient education  and help integrate therapy concepts, techniques,education, etc. 9. PT will assess and treat for/with: Lower extremity strength, range of motion, stamina, balance, functional mobility, safety, adaptive techniques and equipment, coping skills, pain control, stroke education.  Goals are: Mod I. 10. OT will assess and treat for/with: ADL's, functional mobility, safety, upper extremity strength, adaptive techniques and equipment, ego support, and community reintegration.   Goals are: Mod I. Therapy may proceed with showering this patient. 11. SLP will assess and treat for/with: swallowing.  Goals are: Mod I. 12. Case Management and Social Worker will assess and treat for psychological issues and discharge planning. 43. Team conference will be held weekly to assess progress toward goals and to determine barriers to discharge. 14. Patient will receive at least 3 hours of therapy per day at least 5 days per week. 15. ELOS: 8-13 days.      16. Prognosis:  good   Delice Lesch, MD, Mellody Drown Cathlyn Parsons., PA-C 10/19/2016

## 2016-10-19 NOTE — Discharge Summary (Signed)
Stroke Discharge Summary  Patient ID: [name]   MRN: [id number]      DOB: 09-21-1941  Date of Admission: 10/15/2016 Date of Discharge: 10/19/2016  Attending Physician:  Marvel Plan, MD, Stroke MD Consulting Physician(s):  Jone Baseman, MD (pulmonary/intensive care), Maryla Morrow, MD (Physical Medicine & Rehabtilitation) Patient's PCP:  [name], MD  Discharge Diagnoses:  Principal Problem:   Acute embolic stroke (HCC) - R putamen/caudate and R insular infarcts s/p TICI3 revascularization w/ mechanical thrombectomy d/t AF not on [facility] Active Problems:   Diabetes mellitus (HCC)   OSA on CPAP   Pressure injury of skin   Left hemiparesis (HCC)   Atrial fibrillation (HCC)   History of fall   History of traumatic subdural hematoma   Vascular headache   Hypokalemia   Leukocytosis   Acute blood loss anemia   Hypoalbuminemia due to protein-calorie malnutrition (HCC)   Dysphagia, post-stroke   Acute respiratory failure (HCC)   Essential hypertension   Hyperlipidemia LDL goal <70  Past Medical History:  Diagnosis Date  . Atrial fibrillation (HCC)   . Chronic back pain    "mid back down into lower back" (08/25/2014)  . GERD (gastroesophageal reflux disease)   . Hypertriglyceridemia   . OSA on CPAP   . Osteoarthritis    "knees, hands, back" (08/25/2014)  . Pneumonia ~ 2000 X 1  . Subdural hematoma (HCC) july 2015   S/P fall while on Coumadin  . T12 compression fracture (HCC) 2012  . Type II diabetes mellitus (HCC)    Past Surgical History:  Procedure Laterality Date  . APPENDECTOMY  2012  . CATARACT EXTRACTION W/ INTRAOCULAR LENS  IMPLANT, BILATERAL Bilateral 2000's  . IR GENERIC HISTORICAL  10/15/2016   IR PERCUTANEOUS ART THROMBECTOMY/INFUSION INTRACRANIAL INC DIAG ANGIO 10/15/2016 [name], MD [facility]-INTERV RAD  . RADIOLOGY WITH ANESTHESIA N/A 10/15/2016   Procedure: RADIOLOGY WITH ANESTHESIA;  Surgeon: [name], MD;  Location: [facility] OR;  Service:  Radiology;  Laterality: N/A;  . TOTAL ABDOMINAL HYSTERECTOMY  1990    Medications to be continued on Rehab .  stroke: mapping our early stages of recovery book   Does not apply Once  . aspirin  325 mg Oral Daily  . chlorhexidine  15 mL Mouth Rinse BID  . enoxaparin (LOVENOX) injection  30 mg Subcutaneous Q24H  . insulin aspart  0-9 Units Subcutaneous TID WC  . mouth rinse  15 mL Mouth Rinse q12n4p  . pantoprazole sodium  40 mg Per Tube Daily  . pravastatin  10 mg Oral Daily  . RESOURCE THICKENUP CLEAR   Oral Once    LABORATORY STUDIES CBC    Component Value Date/Time   WBC 10.0 10/19/2016 0451   RBC 4.02 10/19/2016 0451   HGB 12.5 10/19/2016 0451   HCT 37.0 10/19/2016 0451   PLT 316 10/19/2016 0451   MCV 92.0 10/19/2016 0451   MCH 31.1 10/19/2016 0451   MCHC 33.8 10/19/2016 0451   RDW 12.1 10/19/2016 0451   LYMPHSABS 1.5 10/16/2016 0356   MONOABS 0.5 10/16/2016 0356   EOSABS 0.1 10/16/2016 0356   BASOSABS 0.0 10/16/2016 0356   CMP    Component Value Date/Time   NA 140 10/19/2016 0451   K 3.7 10/19/2016 0451   CL 108 10/19/2016 0451   CO2 20 (L) 10/19/2016 0451   GLUCOSE 116 (H) 10/19/2016 0451   BUN 8 10/19/2016 0451   CREATININE 0.54 10/19/2016 0451   CALCIUM 8.9 10/19/2016 0451  PROT 6.2 (L) 10/18/2016 0424   ALBUMIN 3.2 (L) 10/18/2016 0424   AST 14 (L) 10/18/2016 0424   ALT 12 (L) 10/18/2016 0424   ALKPHOS 34 (L) 10/18/2016 0424   BILITOT 0.9 10/18/2016 0424   GFRNONAA >60 10/19/2016 0451   GFRAA >60 10/19/2016 0451   COAGS Lab Results  Component Value Date   INR 0.95 10/15/2016   INR 1.04 05/14/2014   INR 1.08 05/13/2014   Lipid Panel    Component Value Date/Time   CHOL 128 10/16/2016 0356   TRIG 198 (H) 10/16/2016 0356   HDL 29 (L) 10/16/2016 0356   CHOLHDL 4.4 10/16/2016 0356   VLDL 40 10/16/2016 0356   LDLCALC 59 10/16/2016 0356   HgbA1C  Lab Results  Component Value Date   HGBA1C 6.1 (H) 10/16/2016   Urinalysis    Component Value  Date/Time   COLORURINE YELLOW 11/09/2014 1632   APPEARANCEUR CLEAR 11/09/2014 1632   LABSPEC 1.009 11/09/2014 1632   PHURINE 5.5 11/09/2014 1632   GLUCOSEU NEGATIVE 11/09/2014 1632   HGBUR NEGATIVE 11/09/2014 1632   BILIRUBINUR NEGATIVE 11/09/2014 1632   KETONESUR NEGATIVE 11/09/2014 1632   PROTEINUR NEGATIVE 11/09/2014 1632   UROBILINOGEN 0.2 11/09/2014 1632   NITRITE NEGATIVE 11/09/2014 1632   LEUKOCYTESUR TRACE (A) 11/09/2014 1632     SIGNIFICANT DIAGNOSTIC STUDIES Ct Head Code Stroke W/o Cm 10/15/2016 1. Positive for hyperdense right MCA bifurcation highly suspicious for emergent large vessel occlusion in this clinical setting. No intracranial hemorrhage or changes of acute cortically based infarct  2. ASPECTS is 10.   Ct Cerebral Perfusion W Contrast 10/15/2016 Positive for emergent large vessel occlusion at the distal right M1 or right MCA bifurcation. Core infarct volume estimated at 17 mL, with large area of right MCA territory penumbra (estimated at 160 mL). Unfortunately due to a scanner malfunction the planned CTA head and neck images were not acquired following this CT perfusion source images.   Cerebral Angiogram  10/15/2016 S/P RT common carotid arteriogram,followed by complete revascularization of occluded RT MCA M1 segment with x1 pass with the solitaireFR 40 mm retrieval device and 1.8 mg of superselective intracranial IA Integrelin with achievement of a TICI 3 reperfusion  Ct Head Wo Contrast 10/15/2016 Contrast staining of the basal ganglia on the right, typically seen following intervention. This does not necessarily indicate infarction of these structures. More peripheral right MCA territory appears normal with gray-white differentiation preservation and no swelling or shift.   Mr Brain Wo Contrast 10/16/2016 Acute infarction affecting the right putamen and caudate. Petechial blood products without frank hematoma. Mild swelling but no shift. Punctate  acute infarction in the deep insula. No other more peripheral acute infarction.   Mr Shirlee Latch Wo Contrast 10/16/2016 Intracranial MR angiography shows wide patency of the vessels with re- cannulization of the right MCA. Good distal vessel flow.   Mr Maxine Glenn Neck Wo Contrast 10/16/2016 Neck MR angiography nondiagnostic because of extensive metal artifact.   Dg Chest Port 1 View 10/17/2016  1.  Stable lines and tubes. 2. Mildly improved lung volumes since yesterday who with no convincing acute cardiopulmonary abnormality.  10/16/2016  Increasing bibasilar atelectasis or infiltrates.  10/15/2016  Endotracheal and nasogastric tubes in grossly good position. Mild bibasilar subsegmental atelectasis. Aortic atherosclerosis.   Carotid Doppler   There is 1-39% bilateral ICA stenosis. Vertebral artery flow is antegrade.    TTE  - Left ventricle: The cavity size was normal. There was moderateconcentric hypertrophy. Systolic function was vigorous.  Theestimated ejection fraction was in the range of 65% to 70%. Wallmotion was normal; there were no regional wall motionabnormalities. - Aortic valve: Valve mobility was trivially restricted.Transvalvular velocity was increased. There was mild stenosis.Valve area (VTI): 0.86 cm^2. Valve area (Vmax): 1.05 cm^2. Valvearea (Vmean): 0.89 cm^2. - Mitral valve: Moderately calcified annulus. There was mildregurgitation. - Left atrium: The atrium was moderately dilated. - Right atrium: The atrium was mildly dilated. - Pulmonary arteries: Systolic pressure was mildly to moderatelyincreased. PA peak pressure: 41 mm Hg (S). Impressions:  No cardiac source of embolism was identified, but cannot be ruledout on the basis of this examination.     HISTORY OF PRESENT ILLNESS Lauren Hongis an 76 y.o.femalewho presents with acute onset of left facial droop and left hemiplegia. She had symptoms of fatigue this morning and then went to rest on her bed at 0900. At  0930 her family noted left arm/leg weakness and facial droop. EMS was called and she was transported to [facility] ED as a Code Stroke. EMS also noted rightward gaze deviation.   She has a history of atrial fibrillation and was on a blood thinner, but this was discontinued after she fell and sustained a subdural bleed. On ASA at home. Does not speak [place]. She is ethnic [place] and emigrated from [place]. She speaks both [place] and [place]  She was LKW at 8:30 AM on 10/15/2016. tPA was not given due to prior history of subdural hemorrhage. NIHSS:18. She was taken to IR where  Dr. Corliss Skains performed a RT common carotid arteriogram followed by complete revascularization of occluded RT MCA M1 segment with x1 pass with the solitaire retrieval device and 1.8 mg of superselective intracranial IA Integrelin with achievement of a TICI 3 reperfusion. She was admitted to neuro ICU for further evaluation and treatment.     HOSPITAL COURSE Ms. Charlane Westry is a 76 y.o. female with history of diabetes mellitus, subdural hematoma in 2015, previous pneumonia, obstructive sleep apnea on C Pap, dyslipidemia, and atrial fibrillation not anticoagulated secondary to subdural hematoma history presenting with left facial droop, left hemiplegia, and right gaze deviation.  She did not receive IV t-PA due to prior history of subdural hematoma.   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 Infarcts felt to be embolic secondary to known atrial fibrillation not on anticoagulation  Resultant  Left mild hemiparesis, Dysphagia, vascular HA  Code Stroke CT hyperdense R MCA. ASPECTS 10   CT Perfusion distal R M1/R MCA bifurcation emergent large vessel occlusion. 17mL core infarct volume with large penumbra (160 mL)  Cerebral Angio TICI3 revascularization R MCA M1   Post IR CT R BG contrast staining post IR  MRI R putamen/caudate infarct with  patchy hemorrhagic transformation, R deep insular infarct.  MRA head patent with recannulization R MCA. Left PCA stenosis  MRA neck non diagnostic d/t metal artifact  CUS No significant stenosis   2D Echo EF 65-70%  LDL - 59  HgbA1c 6.1  aspirin 81 mg daily prior to admission, now on aspirin 325 mg po daily. Plan eliquis 5mg  bid 14 days after for stroke prevention due to hemorrhagic transformation. Pt daughter leaning towards anticoagulation at that time (10/30/2016)  Ongoing aggressive stroke risk factor management  Therapy recommendations: CIR  Disposition: CIR  Hx of small traumatic SDH  Post fall 04/2014  Small amount of SDH at left parietal and no need surgical intervention  Atrial Fibrillation  Home anticoagulation:  none given hx SDH  Pt CHA2DS2-VASc score = 6  HAS BLED score = 4  Pt daughter leaning towards anticoagulation at this time. She will have further discuss with pt PCP.  Plan to start eliquis 5 mg bid 14 days after stroke (10/30/2013)   Hypertension              SBP goal 100 - 180 while in acute hospital              treated with neo in hospital, now off  BP now stable              BP goal normotensive  Hyperlipidemia  Home meds:  Pravachol 10 mg   LDL 59, goal < 70  Resumed Pravachol at discharge  Diabetes  HgbA1c 6.1, goal < 7.0  Glucoses stable  Continue CBG monitoring and SSI  Dysphagia  Secondary to stroke  On dysphagia 1 diet with honey liquids  ST to follow of rehab  Other Stroke Risk Factors  Advanced age  Obstructive sleep apnea, on CPAP at home  Other Active Problems  Mild Acute blood loss anemia post procedure 11.8  History of traumatic subdural hematoma     DISCHARGE EXAM Blood pressure 114/76, pulse 93, temperature 98.6 F (37 C), temperature source Oral, resp. rate 15, height 4\' 10"  (1.473 m), weight 61.7 kg (136 lb 0.4 oz), SpO2 99 %. General - Well nourished, well developed, in no apparent  distress.  Ophthalmologic - Fundi not visualized due to noncooperation.  Cardiovascular - irregularly irregular heart rate and rhythm.  Mental Status -  Level of arousal and orientation to time, place, and person were intact. Language including expression, naming, repetition, comprehension was assessed and found intact, however, moderate dysarthria and hypophonia.  Cranial Nerves II - XII - II - Visual field intact OU. III, IV, VI - Extraocular movements intact. V - Facial sensation decreased on the left. VII - Facial movement intact bilaterally. VIII - Hearing & vestibular intact bilaterally. X - Palate elevates symmetrically, moderate dysarthria XI - Chin turning & shoulder shrug intact bilaterally. XII - Tongue protrusion intact.  Motor Strength - The patient's strength was normal in all extremities except LUE proximal 4/5 and left hand decreased dexterity and pronator drift was present.  Bulk was normal and fasciculations were absent.   Motor Tone - Muscle tone was assessed at the neck and appendages and was normal.  Reflexes - The patient's reflexes were 1+ in all extremities and she had no pathological reflexes.  Sensory - Light touch, temperature/pinprick were assessed and were decreased on the left LE.    Coordination - The patient had normal movements in the hand with no ataxia or dysmetria.  Tremor was absent.  Gait and Station - not tested.  Discharge Diet  DIET - DYS 1 Room service appropriate? Yes; Fluid consistency: Honey Thick liquids  DISCHARGE PLAN  Disposition:  [facility], OT and ST  aspirin 325 mg daily for secondary stroke prevention now  On 10/30/2016 start eliquis 5 mg bid for secondary stroke prevention, stop aspirin  Recommend ongoing risk factor control by Primary Care Physician at time of discharge from inpatient rehabilitation.  Follow-up MILLMAN,FRANKLYN, MD in 2 weeks following discharge from  rehab.  Follow-up with Dr. Marvel PlanJindong Cedrik Heindl, Stroke Clinic in 6 weeks, office to schedule an appointment.   45 minutes were spent preparing discharge.  Marvel PlanJindong Billal Rollo, MD PhD Stroke Neurology 10/20/2016 12:21 AM

## 2016-10-19 NOTE — Progress Notes (Signed)
Patient states she feels her left side in weaker than yesterday. She also states that her headache was just on the right side of her head yesterday but it now feels like it is her whole head. NP biby notified. Patient laying flat, continuous fluids running, tylenol po given. Will continue to monitor.

## 2016-10-20 ENCOUNTER — Inpatient Hospital Stay (HOSPITAL_COMMUNITY): Payer: Medicare Other | Admitting: Occupational Therapy

## 2016-10-20 ENCOUNTER — Inpatient Hospital Stay (HOSPITAL_COMMUNITY): Payer: Medicare Other | Admitting: Physical Therapy

## 2016-10-20 ENCOUNTER — Inpatient Hospital Stay (HOSPITAL_COMMUNITY): Payer: Medicare Other | Admitting: Speech Pathology

## 2016-10-20 DIAGNOSIS — I639 Cerebral infarction, unspecified: Secondary | ICD-10-CM

## 2016-10-20 DIAGNOSIS — R269 Unspecified abnormalities of gait and mobility: Secondary | ICD-10-CM

## 2016-10-20 DIAGNOSIS — I69354 Hemiplegia and hemiparesis following cerebral infarction affecting left non-dominant side: Secondary | ICD-10-CM

## 2016-10-20 DIAGNOSIS — G8194 Hemiplegia, unspecified affecting left nondominant side: Secondary | ICD-10-CM

## 2016-10-20 DIAGNOSIS — I69398 Other sequelae of cerebral infarction: Secondary | ICD-10-CM

## 2016-10-20 LAB — COMPREHENSIVE METABOLIC PANEL
ALBUMIN: 3.3 g/dL — AB (ref 3.5–5.0)
ALK PHOS: 35 U/L — AB (ref 38–126)
ALT: 10 U/L — AB (ref 14–54)
AST: 16 U/L (ref 15–41)
Anion gap: 9 (ref 5–15)
BILIRUBIN TOTAL: 0.4 mg/dL (ref 0.3–1.2)
BUN: 7 mg/dL (ref 6–20)
CALCIUM: 8.9 mg/dL (ref 8.9–10.3)
CO2: 23 mmol/L (ref 22–32)
CREATININE: 0.6 mg/dL (ref 0.44–1.00)
Chloride: 108 mmol/L (ref 101–111)
GFR calc Af Amer: >60 mL/min (ref >60)
GLUCOSE: 120 mg/dL — AB (ref 65–99)
POTASSIUM: 3.6 mmol/L (ref 3.5–5.1)
Sodium: 140 mmol/L (ref 135–145)
TOTAL PROTEIN: 6.2 g/dL — AB (ref 6.5–8.1)

## 2016-10-20 LAB — CBC WITH DIFFERENTIAL/PLATELET
BASOS ABS: 0 10*3/uL (ref 0.0–0.1)
BASOS PCT: 1 %
Eosinophils Absolute: 0.4 10*3/uL (ref 0.0–0.7)
Eosinophils Relative: 6 %
HEMATOCRIT: 35.3 % — AB (ref 36.0–46.0)
HEMOGLOBIN: 12 g/dL (ref 12.0–15.0)
LYMPHS PCT: 16 %
Lymphs Abs: 1.2 10*3/uL (ref 0.7–4.0)
MCH: 31.1 pg (ref 26.0–34.0)
MCHC: 34 g/dL (ref 30.0–36.0)
MCV: 91.5 fL (ref 78.0–100.0)
MONO ABS: 0.6 10*3/uL (ref 0.1–1.0)
Monocytes Relative: 8 %
NEUTROS ABS: 5.3 10*3/uL (ref 1.7–7.7)
NEUTROS PCT: 69 %
Platelets: 331 10*3/uL (ref 150–400)
RBC: 3.86 MIL/uL — ABNORMAL LOW (ref 3.87–5.11)
RDW: 12 % (ref 11.5–15.5)
WBC: 7.6 10*3/uL (ref 4.0–10.5)

## 2016-10-20 LAB — GLUCOSE, CAPILLARY
GLUCOSE-CAPILLARY: 119 mg/dL — AB (ref 65–99)
Glucose-Capillary: 157 mg/dL — ABNORMAL HIGH (ref 65–99)
Glucose-Capillary: 75 mg/dL (ref 65–99)

## 2016-10-20 MED ORDER — PRO-STAT SUGAR FREE PO LIQD
30.0000 mL | Freq: Two times a day (BID) | ORAL | Status: DC
  Administered 2016-10-20 – 2016-11-01 (×20): 30 mL via ORAL
  Filled 2016-10-20 (×23): qty 30

## 2016-10-20 NOTE — Care Management Note (Signed)
[facility]patient [facility] Individual Statement of Services  Patient Name:  [name]  Date:  10/20/2016  Welcome to the [facility]patient [facility].  Our goal is to provide you with an individualized program based on your diagnosis and situation, designed to meet your specific needs.  With this comprehensive rehabilitation program, you will be expected to participate in at least 3 hours of rehabilitation therapies Monday-Friday, with modified therapy programming on the weekends.  Your rehabilitation program will include the following services:  Physical Therapy (PT), Occupational Therapy (OT), Speech Therapy (ST), 24 hour per day rehabilitation nursing, Therapeutic Recreaction (TR), Case Management (Social Worker), Rehabilitation Medicine, Nutrition Services and Pharmacy Services  Weekly team conferences will be held on Wednesday to discuss your progress.  Your Social Worker will talk with you frequently to get your input and to update you on team discussions.  Team conferences with you and your family in attendance may also be held.  Expected length of stay: 8-10 days Overall anticipated outcome: mod/i-supervision with tub transfers & community ambulation  Depending on your progress and recovery, your program may change. Your Social Worker will coordinate services and will keep you informed of any changes. Your Social Worker's name and contact numbers are listed  below.  The following services may also be recommended but are not provided by the Inpatient Rehabilitation Center:    Home Health Rehabiltiation Services  Outpatient Rehabilitation Services    Arrangements will be made to provide these services after discharge if needed.  Arrangements include referral to agencies that provide these services.  Your insurance has been verified to be:  Medicare & Medicaid Your primary doctor is:  Essie ChristineFranklin Millman  Pertinent information will be shared with your doctor and your insurance  company.  Social Worker:  Dossie DerBecky Thomasenia Dowse, SW [phone] or [phone]) [phone]  Information discussed with and copy given to patient by: Lucy Chrisupree, Zelpha Messing G, 10/20/2016, 11:52 AM

## 2016-10-20 NOTE — Evaluation (Signed)
Speech Language Pathology Assessment and Plan  Patient Details  Name: [name] MRN: [id number] Date of Birth: Apr 07, 1941  SLP Diagnosis: Dysphagia;Cognitive Impairments  Rehab Potential: Good ELOS: 7-10 days     Today's Date: 10/20/2016 SLP Individual Time: 1300-1400 SLP Individual Time Calculation (min): 60 min    Problem List: Patient Active Problem List   Diagnosis Date Noted  . Gait disturbance, post-stroke 10/20/2016  . Hemiparesis affecting left side as late effect of stroke ([place]) 10/20/2016  . Essential hypertension 10/19/2016  . Hyperlipidemia LDL goal <70 10/19/2016  . 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[place]) 59/56/3875  . Left hemiparesis ([place])   . Atrial fibrillation ([place])   . History of fall   . History of traumatic subdural hematoma   . Vascular headache   . Hypokalemia   . Leukocytosis   . Acute blood loss anemia   . Hypoalbuminemia due to protein-calorie malnutrition ([place])   . Dysphagia, post-stroke   . Acute respiratory failure ([place])   . Acute embolic stroke ([facility]) - R putamen/caudate and R insular infarcts s/p TICI3 revascularization w/ mechanical thrombectomy d/t AF not on [facility] 10/15/2016  . Pressure injury of skin 10/15/2016  . HCAP (healthcare-associated pneumonia) 11/09/2014  . Weakness 08/25/2014  . Acute bronchitis 08/25/2014  . UTI (lower urinary tract infection) 08/25/2014  . Fracture of lumbar spine ([place]) 08/25/2014  . Subdural hematoma, post-traumatic ([place]) 05/12/2014  . Chronic atrial fibrillation ([place]) 05/12/2014  . Diabetes mellitus ([place]) 05/12/2014  . OSA on CPAP 05/12/2014   Past Medical History:  Past Medical History:  Diagnosis Date  . Atrial fibrillation ([place])   . Chronic back pain    "mid back down into lower back" (08/25/2014)  . GERD (gastroesophageal reflux disease)   . Hypertriglyceridemia   . OSA on CPAP   . Osteoarthritis    "knees, hands, back" (08/25/2014)  . Pneumonia ~ 2000 X 1  . Subdural hematoma ([place]) july 2015   S/P fall while  on Coumadin  . T12 compression fracture ([place]) 2012  . Type II diabetes mellitus ([place])    Past Surgical History:  Past Surgical History:  Procedure Laterality Date  . APPENDECTOMY  2012  . CATARACT EXTRACTION W/ INTRAOCULAR LENS  IMPLANT, BILATERAL Bilateral 2000's  . IR GENERIC HISTORICAL  10/15/2016   IR PERCUTANEOUS ART THROMBECTOMY/INFUSION INTRACRANIAL INC DIAG ANGIO 10/15/2016 [name], MD [facility]-INTERV RAD  . RADIOLOGY WITH ANESTHESIA N/A 10/15/2016   Procedure: RADIOLOGY WITH ANESTHESIA;  Surgeon: [name], MD;  Location: [place];  Service: Radiology;  Laterality: N/A;  . TOTAL ABDOMINAL HYSTERECTOMY  1990    Assessment / Plan / Recommendation Clinical Impression   Lauren Hongis a 76 y.o.right handed femalewith history of atrial fibrillation on no anticoagulation due to history of falls and SDH, diabetes mellitus.  Presented 10/15/2016 with left-sided weakness and facial droop.. Patient lives with daughterand son-in-law who work. She does have another daughter who checks on herbut cannot provide 24-hour care. Reported to be independent with assistive device prior to admission. Cranial CT scan showed positive hyperdense right MCA bifurcation suspicious for emergent large vessel occlusion. No intracranial hemorrhage. 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  Modified barium swallow completed presently on a dysphagia #1 honey thick liquid diet. Patient was admitted for comprehensive rehabilitation program on 10/19/2016.  SLP evaluation completed on 10/20/2016 with the following results:  Pt presents with s/s of a mild-moderate oropharyngeal dysphagia which appears consistent with most recent objective study.  Pt  demonstrated multiple swallows with thin liquids which was intermittently followed by a delayed cough.  Pt also demonstrated suspected delayed swallow initiation across all consistencies and at times would not trigger a swallow  until a second bolus was administered. Mastication of solids also remains prolonged and results in residual solids remaining in the oral cavity post swallow.  As a result, recommend that pt remain on her currently prescribed diet with trials of advanced consistencies with SLP to continue working towards repeat objective assessment.   Pt also presents with mild cognitive impairment.  She reports feeling that her memory is more impaired following her stroke and she demonstrated some mildly impulsive behaviors during mobility and self feeding.   Given the abovementioned deficits, pt would benefit from skilled ST while inpatient in order to maximize functional independence and reduce burden of care prior to discharge.    Skilled Therapeutic Interventions          Cognitive-linguistic and bedside swallow evaluation completed with results and recommendations reviewed with patient and family.   Therapist reviewed and reinforced results from [place] in depth with pt's daughter who was present during today's evaluation and acted as Optometrist.  All questions were answered to her satisfaction at this time.  Therapist also discussed in depth SLP's role in cognitive remediation-compensation for maximizing functional independence and facilitating return to previous level of function.  Pt needed min-supervision verbal cues for safety due to impulsivity with mobility when transferring from wheelchair to bed at the end of today's therapy session.  Pt left in bed with daughter at bedside.      SLP Assessment  Patient will need skilled Speech Lanaguage Pathology Services during CIR admission    Recommendations  SLP Diet Recommendations: Dysphagia 1 (Puree);Honey Medication Administration: Crushed with puree Supervision: Patient able to self feed;Full supervision/cueing for compensatory strategies Compensations: Slow rate;Small sips/bites;Lingual sweep for clearance of pocketing Postural Changes and/or Swallow Maneuvers:  Seated upright 90 degrees Oral Care Recommendations: Oral care BID Patient destination: Home Follow up Recommendations: Home Health SLP;Outpatient SLP Equipment Recommended: To be determined    SLP Frequency 3 to 5 out of 7 days   SLP Duration  SLP Intensity  SLP Treatment/Interventions 7-10 days   Minumum of 1-2 x/day, 30 to 90 minutes  Cognitive remediation/compensation;Cueing hierarchy;Dysphagia/aspiration precaution training;Environmental controls;Internal/external aids;Patient/family education    Pain Pain Assessment Pain Assessment: No/denies pain  Prior Functioning Cognitive/Linguistic Baseline: Baseline deficits Baseline deficit details: daughter and pt both report ongoing changes in memory Type of Home: House  Lives With: Family Available Help at Discharge: Available PRN/intermittently;Family Vocation: Retired  Function:  Eating Eating   Modified Consistency Diet: Yes Eating Assist Level: Set up assist for;Supervision or verbal cues;Helper checks for pocketed food   Eating Set Up Assist For: Opening containers       Cognition Comprehension Comprehension assist level: Follows basic conversation/direction with no assist  Expression   Expression assist level: Expresses basic needs/ideas: With no assist  Social Interaction Social Interaction assist level: Interacts appropriately 90% of the time - Needs monitoring or encouragement for participation or interaction.  Problem Solving Problem solving assist level: Solves basic 75 - 89% of the time/requires cueing 10 - 24% of the time  Memory Memory assist level: Recognizes or recalls 90% of the time/requires cueing < 10% of the time   Short Term Goals: Week 1: SLP Short Term Goal 1 (Week 1): STG=LTG due to ELOS   Refer to Care Plan for Long Term Goals  Recommendations for  other services: None   Discharge Criteria: Patient will be discharged from SLP if patient refuses treatment 3 consecutive times without  medical reason, if treatment goals not met, if there is a change in medical status, if patient makes no progress towards goals or if patient is discharged from hospital.  The above assessment, treatment plan, treatment alternatives and goals were discussed and mutually agreed upon: by patient and by family  Emilio Math 10/20/2016, 3:47 PM

## 2016-10-20 NOTE — Progress Notes (Signed)
Subjective/Complaints: Patient seen in physical therapy. She ambulates with min guard assist. Up and down 4 steps. Review systems: Daughter helps with interpretation. Denies any nausea, vomiting, no bowel or bladder issues. No breathing issues. No pain issues.  Objective: Vital Signs: Blood pressure 119/70, pulse 80, temperature 98.7 F (37.1 C), temperature source Oral, resp. rate 18, height '4\' 10"'  (1.473 m), weight 59.7 kg (131 lb 9.6 oz), SpO2 98 %. Dg Swallowing Func-speech Pathology  Result Date: 10/18/2016 Objective Swallowing Evaluation: Type of Study: MBS-Modified Barium Swallow Study Patient Details Name: [name] MRN: [id number] Date of Birth: Jul 29, 1941 Today's Date: 10/18/2016 Time: SLP Start Time (ACUTE ONLY): 1330-SLP Stop Time (ACUTE ONLY): 1345 SLP Time Calculation (min) (ACUTE ONLY): 15 min Past Medical History: Past Medical History: Diagnosis Date . Atrial fibrillation ([place])  . Chronic back pain   "mid back down into lower back" (08/25/2014) . GERD (gastroesophageal reflux disease)  . Hypertriglyceridemia  . OSA on CPAP  . Osteoarthritis   "knees, hands, back" (08/25/2014) . Pneumonia ~ 2000 X 1 . Subdural hematoma ([place]) july 2015  S/P fall while on Coumadin . T12 compression fracture ([place]) 2012 . Type II diabetes mellitus ([place])  Past Surgical History: Past Surgical History: Procedure Laterality Date . APPENDECTOMY  2012 . CATARACT EXTRACTION W/ INTRAOCULAR LENS  IMPLANT, BILATERAL Bilateral 2000's . RADIOLOGY WITH ANESTHESIA N/A 10/15/2016  Procedure: RADIOLOGY WITH ANESTHESIA;  Surgeon: [name], MD;  Location: [facility];  Service: Radiology;  Laterality: N/A; . TOTAL ABDOMINAL HYSTERECTOMY  1990 HPI: 76 y.o.femalewith history of diabetes mellitus, subdural hematoma in 2015, previous pneumonia, obstructive sleep apnea on C Pap, dyslipidemia, and atrial fibrillation not anticoagulated secondary to subdural hematoma history presenting with left facial droop, left hemiplegia, and right  gaze deviation. 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 Subjective: alert, dtr present for translation Assessment / Plan / Recommendation CHL IP CLINICAL IMPRESSIONS 10/18/2016 Therapy Diagnosis Mild oral phase dysphagia;Mild pharyngeal phase dysphagia Clinical Impression Pt presents with a mild-moderate oropharyngeal dysphagia marked by decreased oral control/manipulation with barium retention/spillage left anterior oral cavity; premature bolus loss for all consistencies, to level of pyriforms with liquids; consistent penetration of liquids (thin and nectar-thick), fewer incidents of penetration with honey-thick.  Chin tuck did not improve airway protection.  There was consistent vallecular residue.  There was no observed aspiration, but given presence of pharyngeal residue post-swallow, consistent penetration, and sensory deficits, recommend initiating a ocnservative diet of dysphagia 1, honey-thick liquids.  SLP will follow for safety/diet progression.  Pt's daughter was present for study and assisted with translating results/recs to her mother.  Impact on safety and function Moderate aspiration risk   CHL IP TREATMENT RECOMMENDATION 10/18/2016 Treatment Recommendations Therapy as outlined in treatment plan below   Prognosis 10/18/2016 Prognosis for Safe Diet Advancement Good Barriers to Reach Goals -- Barriers/Prognosis Comment -- CHL IP DIET RECOMMENDATION 10/18/2016 SLP Diet Recommendations Dysphagia 1 (Puree) solids;Honey thick liquids Liquid Administration via Cup Medication Administration Crushed with puree Compensations Slow rate;Small sips/bites Postural Changes Seated upright at 90 degrees   CHL IP OTHER RECOMMENDATIONS 10/18/2016 Recommended Consults -- Oral Care Recommendations Oral care BID Other Recommendations Order thickener from pharmacy   CHL IP FOLLOW UP RECOMMENDATIONS 10/18/2016 Follow up Recommendations Inpatient Rehab   CHL IP  FREQUENCY AND DURATION 10/18/2016 Speech Therapy Frequency (ACUTE ONLY) min 2x/week Treatment Duration 2 weeks      CHL IP ORAL PHASE 10/18/2016 Oral Phase Impaired Oral -  Pudding Teaspoon -- Oral - Pudding Cup -- Oral - Honey Teaspoon -- Oral - Honey Cup Weak lingual manipulation;Decreased bolus cohesion;Premature spillage Oral - Nectar Teaspoon -- Oral - Nectar Cup Weak lingual manipulation;Decreased bolus cohesion;Premature spillage;Left anterior bolus loss Oral - Nectar Straw -- Oral - Thin Teaspoon -- Oral - Thin Cup Weak lingual manipulation;Decreased bolus cohesion;Premature spillage;Left anterior bolus loss Oral - Thin Straw -- Oral - Puree Weak lingual manipulation;Decreased bolus cohesion;Premature spillage Oral - Mech Soft -- Oral - Regular -- Oral - Multi-Consistency -- Oral - Pill -- Oral Phase - Comment --  CHL IP PHARYNGEAL PHASE 10/18/2016 Pharyngeal Phase Impaired Pharyngeal- Pudding Teaspoon -- Pharyngeal -- Pharyngeal- Pudding Cup -- Pharyngeal -- Pharyngeal- Honey Teaspoon -- Pharyngeal -- Pharyngeal- Honey Cup Reduced tongue base retraction;Pharyngeal residue - valleculae Pharyngeal -- Pharyngeal- Nectar Teaspoon -- Pharyngeal -- Pharyngeal- Nectar Cup Reduced tongue base retraction;Pharyngeal residue - valleculae;Penetration/Aspiration during swallow;Reduced airway/laryngeal closure Pharyngeal Material enters airway, remains ABOVE vocal cords and not ejected out Pharyngeal- Nectar Straw -- Pharyngeal -- Pharyngeal- Thin Teaspoon -- Pharyngeal -- Pharyngeal- Thin Cup Reduced airway/laryngeal closure;Penetration/Aspiration during swallow;Reduced tongue base retraction;Pharyngeal residue - valleculae Pharyngeal Material enters airway, remains ABOVE vocal cords and not ejected out Pharyngeal- Thin Straw -- Pharyngeal -- Pharyngeal- Puree -- Pharyngeal -- Pharyngeal- Mechanical Soft -- Pharyngeal -- Pharyngeal- Regular -- Pharyngeal -- Pharyngeal- Multi-consistency -- Pharyngeal -- Pharyngeal- Pill --  Pharyngeal -- Pharyngeal Comment --  CHL IP CERVICAL ESOPHAGEAL PHASE 10/18/2016 Cervical Esophageal Phase Impaired Pudding Teaspoon -- Pudding Cup -- Honey Teaspoon -- Honey Cup -- Nectar Teaspoon -- Nectar Cup -- Nectar Straw -- Thin Teaspoon -- Thin Cup -- Thin Straw -- Puree -- Mechanical Soft -- Regular -- Multi-consistency -- Pill -- Cervical Esophageal Comment -- No flowsheet data found. [name] 10/18/2016, 2:19 PM              Dg Chest 1 View  Result Date: 10/18/2016 CLINICAL DATA:  Chest pain EXAM: CHEST 1 VIEW COMPARISON:  October 17, 2016 FINDINGS: Endotracheal tube and nasogastric tube have been removed. No pneumothorax. There is atelectatic change in the right upper lobe and left base regions. Lungs elsewhere are clear. Heart size and pulmonary vascularity are normal. No adenopathy. There is atherosclerotic calcification in the aorta. No bone lesions. IMPRESSION: No pneumothorax. Areas of atelectasis. Early pneumonia in the right upper lobe and left base regions cannot be excluded radiographically. Stable cardiac silhouette. There is aortic atherosclerosis. Electronically Signed   By: Lowella Grip III M.D.   On: 10/18/2016 13:52   Results for orders placed or performed during the hospital encounter of 10/19/16 (from the past 72 hour(s))  CBC WITH DIFFERENTIAL     Status: Abnormal   Collection Time: 10/20/16  5:53 AM  Result Value Ref Range   WBC 7.6 4.0 - 10.5 K/uL   RBC 3.86 (L) 3.87 - 5.11 MIL/uL   Hemoglobin 12.0 12.0 - 15.0 g/dL   HCT 35.3 (L) 36.0 - 46.0 %   MCV 91.5 78.0 - 100.0 fL   MCH 31.1 26.0 - 34.0 pg   MCHC 34.0 30.0 - 36.0 g/dL   RDW 12.0 11.5 - 15.5 %   Platelets 331 150 - 400 K/uL   Neutrophils Relative % 69 %   Neutro Abs 5.3 1.7 - 7.7 K/uL   Lymphocytes Relative 16 %   Lymphs Abs 1.2 0.7 - 4.0 K/uL   Monocytes Relative 8 %   Monocytes Absolute 0.6 0.1 - 1.0 K/uL  Eosinophils Relative 6 %   Eosinophils Absolute 0.4 0.0 - 0.7 K/uL   Basophils  Relative 1 %   Basophils Absolute 0.0 0.0 - 0.1 K/uL  Comprehensive metabolic panel     Status: Abnormal   Collection Time: 10/20/16  5:53 AM  Result Value Ref Range   Sodium 140 135 - 145 mmol/L   Potassium 3.6 3.5 - 5.1 mmol/L   Chloride 108 101 - 111 mmol/L   CO2 23 22 - 32 mmol/L   Glucose, Bld 120 (H) 65 - 99 mg/dL   BUN 7 6 - 20 mg/dL   Creatinine, Ser 0.60 0.44 - 1.00 mg/dL   Calcium 8.9 8.9 - 10.3 mg/dL   Total Protein 6.2 (L) 6.5 - 8.1 g/dL   Albumin 3.3 (L) 3.5 - 5.0 g/dL   AST 16 15 - 41 U/L   ALT 10 (L) 14 - 54 U/L   Alkaline Phosphatase 35 (L) 38 - 126 U/L   Total Bilirubin 0.4 0.3 - 1.2 mg/dL   GFR calc non Af Amer >60 >60 mL/min   GFR calc Af Amer >60 >60 mL/min    Comment: (NOTE) The eGFR has been calculated using the CKD EPI equation. This calculation has not been validated in all clinical situations. eGFR's persistently <60 mL/min signify possible Chronic Kidney Disease.    Anion gap 9 5 - 15     HEENT: normal Cardio: RRR and No murmur or extra sounds Resp: CTA B/L and Unlabored GI: BS positive and Nontender, nondistended Extremity:  No Edema Skin:   Intact Neuro: Alert/Oriented, Abnormal Sensory Reports reduced LT sensation on Left hand compared to Right, Abnormal Motor 4- Left delt Bi Tri Grip, 4+ Left HF, KE, 3- L ankle DF, Abnormal FMC Ataxic/ dec FMC and Tone:  Within Normal Limits Musc/Skel:  Other ful ROM in UEs, no pain with LE AROM Gen NAD   Assessment/Plan: 1. 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 Physiatrist is providing close team supervision and 24 hour management of active medical problems listed below. Physiatrist and rehab team continue to assess barriers to discharge/monitor patient progress toward functional and medical goals. FIM: Function - Bathing Bathing activity did not occur:  N/A  Function- Upper Body Dressing/Undressing Upper body dressing/undressing activity did not occur: N/A What is the patient wearing?: Hospital gown Function - Lower Body Dressing/Undressing Lower body dressing/undressing activity did not occur: N/A What is the patient wearing?: Hospital Gown  Function - Toileting Toileting steps completed by patient: Adjust clothing prior to toileting, Performs perineal hygiene, Adjust clothing after toileting Toileting Assistive Devices: Grab bar or rail  Function - Toilet Transfers Assist level to toilet: Touching or steadying assistance (Pt > 75%) Assist level from toilet: Touching or steadying assistance (Pt > 75%)  Function - Chair/bed transfer Chair/bed transfer activity did not occur: N/A     Function - Comprehension Comprehension: Auditory Comprehension assist level: Understands basic 25 - 49% of the time/ requires cueing 50 - 75% of the time  Function - Expression Expression: Verbal Expression assist level: Expresses basic 50 - 74% of the time/requires cueing 25 - 49% of the time. Needs to repeat parts of sentences.  Function - Social Interaction Social Interaction assist level: Interacts appropriately 25 - 49% of time - Needs frequent redirection.  Function - Problem Solving Problem solving assist level: Solves basic 25 - 49% of the  time - needs direction more than half the time to initiate, plan or complete simple activities  Function - Memory Memory assist level: Recognizes or recalls 25 - 49% of the time/requires cueing 50 - 75% of the time Patient normally able to recall (first 3 days only): That he or she is in a hospital, Staff names and faces  Medical Problem List and Plan: 1.  Left hemiplegia, dysphagia secondary to right putamen caudate and right insular infarct status post revascularization right M1 with mechanical thrombectomy Therapy eval today, PT, OT, speech 2.  DVT Prophylaxis/Anticoagulation: Subcutaneous Lovenox.  Monitor for any bleeding episodes 3. Pain Management/headaches: Fioricet as needed 4. Dysphagia. Dysphagia #1 Honey thick liquid diet. Monitor hydration. Follow-up speech therapy 5. Neuropsych: This patient is capable of making decisions on her own behalf. 6. Skin/Wound Care: Routine skin checks 7. Fluids/Electrolytes/Nutrition: Routine I&O with follow-up chemistries 8. Diabetes mellitus peripheral neuropathy. Hemoglobin A1c 6.1.SSI. Check blood sugars before meals and at bedtime. Patient on Glucophage 500 mg twice a day prior to admission and resume as needed 9. Atrial fibrillation. Patient on aspirin 81 mg daily prior to admission and monitor closely for history of falls with SDH. Cardiac rate controlled. Await possible plan to begin Eliquis 5 mg twice a day 14 days after stroke Per daughter, patient had prior history of small subdural hematoma after a fall in 2015. This was why she was taken off anticoagulation. 10.  Hypoalb add supplement LOS (Days) 1 A FACE TO FACE EVALUATION WAS PERFORMED  [name] 10/20/2016, 8:10 AM

## 2016-10-20 NOTE — Evaluation (Signed)
Physical Therapy Assessment and Plan  Patient Details  Name: [name] MRN: [id number] Date of Birth: [birth year]  PT Diagnosis: Abnormality of gait, Coordination disorder, Hemiplegia non-dominant and Muscle weakness Rehab Potential: Excellent ELOS: 7-10    Today's Date: 10/20/2016 PT Individual Time: 0810-0900 PT Individual Time Calculation (min): 50 min     Problem List:  Patient Active Problem List   Diagnosis Date Noted  . Gait disturbance, post-stroke 10/20/2016  . Hemiparesis affecting left side as late effect of stroke ([place]) 10/20/2016  . Essential hypertension 10/19/2016  . Hyperlipidemia LDL goal <70 10/19/2016  . 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[place]) 91/79/1505  . Left hemiparesis ([place])   . Atrial fibrillation ([place])   . History of fall   . History of traumatic subdural hematoma   . Vascular headache   . Hypokalemia   . Leukocytosis   . Acute blood loss anemia   . Hypoalbuminemia due to protein-calorie malnutrition ([place])   . Dysphagia, post-stroke   . Acute respiratory failure ([place])   . Acute embolic stroke (HCC) - R putamen/caudate and R insular infarcts s/p TICI3 revascularization w/ mechanical thrombectomy d/t AF not on [facility] 10/15/2016  . Pressure injury of skin 10/15/2016  . HCAP (healthcare-associated pneumonia) 11/09/2014  . Weakness 08/25/2014  . Acute bronchitis 08/25/2014  . UTI (lower urinary tract infection) 08/25/2014  . Fracture of lumbar spine ([place]) 08/25/2014  . Subdural hematoma, post-traumatic ([place]) 05/12/2014  . Chronic atrial fibrillation ([place]) 05/12/2014  . Diabetes mellitus ([place]) 05/12/2014  . OSA on CPAP 05/12/2014    Past Medical History:  Past Medical History:  Diagnosis Date  . Atrial fibrillation ([place])   . Chronic back pain    "mid back down into lower back" (08/25/2014)  . GERD (gastroesophageal reflux disease)   . Hypertriglyceridemia   . OSA on CPAP   . Osteoarthritis    "knees, hands, back" (08/25/2014)  . Pneumonia ~ 2000 X 1  . Subdural  hematoma ([place]) july 2015   S/P fall while on Coumadin  . T12 compression fracture ([place]) 2012  . Type II diabetes mellitus ([place])    Past Surgical History:  Past Surgical History:  Procedure Laterality Date  . APPENDECTOMY  2012  . CATARACT EXTRACTION W/ INTRAOCULAR LENS  IMPLANT, BILATERAL Bilateral 2000's  . IR GENERIC HISTORICAL  10/15/2016   IR PERCUTANEOUS ART THROMBECTOMY/INFUSION INTRACRANIAL INC DIAG ANGIO 10/15/2016 [name], MD [facility]-INTERV RAD  . RADIOLOGY WITH ANESTHESIA N/A 10/15/2016   Procedure: RADIOLOGY WITH ANESTHESIA;  Surgeon: [name], MD;  Location: [place];  Service: Radiology;  Laterality: N/A;  . TOTAL ABDOMINAL HYSTERECTOMY  1990    Assessment & Plan Clinical Impression: Patient is a 76 y.o.right handed femalewith history of atrial fibrillation on no anticoagulation due to history of falls and SDH, diabetes mellitus. History taken from chart review and daughter, previously. Presented 10/15/2016 with left-sided weakness and facial droop.. Patient lives with daughterand son-in-law who work. She does have another daughter who checks on herbut cannot provide 24-hour care. Reported to be independent with assistive device prior to admission. Two-level home with bedroom on first floor and 3 steps to entry. Cranial CT scan showed positive hyperdense right MCA bifurcation suspicious for emergent large vessel occlusion. No intracranial hemorrhage. CT cerebral perfusion scan positive for emergent large vessel occlusion distal right M1 or right MCA bifurcation. Cerebral angiogram and underwent complete revascularization of occluded right MCA M1 segment per interventional radiology. MRI of the brain 10/16/2016 showed acute infarct affecting the right putamen and  caudate. Petechial blood products without frank hematoma. Mild swelling without shift. MRA of the head 10/16/2016 wide patency of the vessels with recannulization of the right MCA. Patient transferred to CIR on  10/19/2016 .   Patient currently requires min with mobility secondary to muscle weakness and decreased standing balance, hemiplegia and decreased balance strategies.  Prior to hospitalization, patient was modified independent  with mobility and lived with Family in a House home.  Home access is 3Stairs to enter.  Patient will benefit from skilled PT intervention to maximize safe functional mobility, minimize fall risk and decrease caregiver burden for planned discharge home with intermittent assist.  Anticipate patient will benefit from follow up [facility] at discharge.  PT - End of Session Activity Tolerance: Tolerates 30+ min activity with multiple rests Endurance Deficit: Yes PT Assessment Rehab Potential (ACUTE/IP ONLY): Excellent Barriers to Discharge: Inaccessible home environment;Decreased caregiver support PT Patient demonstrates impairments in the following area(s): Balance;Endurance;Motor;Perception;Safety;Sensory PT Transfers Functional Problem(s): Bed to Chair;Bed Mobility;Car;Furniture;Floor PT Locomotion Functional Problem(s): Ambulation;Wheelchair Mobility;Stairs PT Plan PT Intensity: Minimum of 1-2 x/day ,45 to 90 minutes PT Frequency: 5 out of 7 days PT Duration Estimated Length of Stay: 7-10  PT Treatment/Interventions: Ambulation/gait training;Balance/vestibular training;Cognitive remediation/compensation;Community reintegration;Discharge planning;Disease management/prevention;DME/adaptive equipment instruction;Functional electrical stimulation;Functional mobility training;Neuromuscular re-education;Patient/family education;Pain management;Psychosocial support;Splinting/orthotics;Stair training;Therapeutic Activities;Therapeutic Exercise;UE/LE Strength taining/ROM;UE/LE Coordination activities;Visual/perceptual remediation/compensation;Wheelchair propulsion/positioning PT Transfers Anticipated Outcome(s): Mod I with LRAD  PT Locomotion Anticipated Outcome(s): Mod I Supervision assist  with LRAD  PT Recommendation Recommendations for Other Services: Therapeutic Recreation consult Therapeutic Recreation Interventions: Outing/community reintergration Follow Up Recommendations: Home health PT Patient destination: Home Equipment Recommended: Rolling walker with 5" wheels     Skilled Therapeutic Intervention Patient received sitting in [facility] and agreeable to PT.  PT instructed patient in Evaluation and initiated treatment intervention; see below for results. PT educated patient in LOS, goals of rehab, treatment intervention, basic transfers, car transfers, bed mobility, gait, and stairs. Patient functions at [facility] assist as detailed below. Following treatment, patient returned to room and left sitting in [facility] with Daughter present.   PT Evaluation Precautions/Restrictions  Fall General   Vital Signs   Missed Time 10 min ( eating)  Pain   0/10  Home Living/Prior Functioning Home Living Available Help at Discharge: Available PRN/intermittently;Family (lives with daughter who works, sister can come by) Type of Home: House Home Access: Stairs to enter Technical brewer of Steps: 3 Entrance Stairs-Rails: Right Home Layout: Two level;Able to live on main level with bedroom/bathroom Alternate Level Stairs-Number of Steps: 12 Alternate Level Stairs-Rails: Right Bathroom Shower/Tub: Chiropodist: Standard Bathroom Accessibility: Yes Prior Function Level of Independence: Requires assistive device for independence  Able to Take Stairs?: Yes Driving: No Vocation: Retired Comments: active in the home. rides stationary bike 15-20 min per day Vision/Perception    WFL. Cognition Overall Cognitive Status: Within Functional Limits for tasks assessed Arousal/Alertness: Awake/alert Attention: Focused;Sustained Focused Attention: Appears intact Sustained Attention: Appears intact Memory: Impaired Awareness: Appears intact Problem Solving: Appears  intact Safety/Judgment: Impaired Comments: impulsive Sensation Sensation Light Touch: Impaired by gross assessment Proprioception: Impaired by gross assessment Additional Comments: mild proprioception deficits noted with functional movement.  Coordination Gross Motor Movements are Fluid and Coordinated: No Fine Motor Movements are Fluid and Coordinated: No Motor  Motor Motor: Hemiplegia Motor - Skilled Clinical Observations: mild LUE and LLE hemiparesis  Mobility Bed Mobility Bed Mobility: Rolling Right;Rolling Left;Supine to Sit;Sit to Supine Rolling Right: 5: Supervision Rolling Right Details: Verbal cues for technique  Rolling Left: 5: Supervision Rolling Left Details: Verbal cues for technique Supine to Sit: 3: Mod assist Supine to Sit Details: Tactile cues for placement;Tactile cues for sequencing;Verbal cues for sequencing;Visual cues/gestures for sequencing Sit to Supine: 5: Supervision Sit to Supine - Details: Visual cues/gestures for precautions/safety Transfers Transfers: Yes Sit to Stand: 4: Min assist Sit to Stand Details: Verbal cues for precautions/safety Stand to Sit: 4: Min assist Stand to Sit Details (indicate cue type and reason): Visual cues/gestures for precautions/safety Stand Pivot Transfers: 4: Min assist Locomotion  Ambulation Ambulation: Yes Ambulation/Gait Assistance: 4: Min assist Ambulation Distance (Feet): 70 Feet Assistive device: None Ambulation/Gait Assistance Details: Verbal cues for technique;Verbal cues for precautions/safety;Visual cues/gestures for precautions/safety Gait Gait: Yes Gait Pattern: Impaired Stairs / Additional Locomotion Stairs: Yes Stairs Assistance: 4: Min assist Stairs Assistance Details: Visual cues/gestures for precautions/safety Stair Management Technique: Two rails Number of Stairs: 12 Height of Stairs: 3 (and 6) Ramp: 4: Min assist Curb: 4: Min Chemical engineer: Yes Wheelchair  Assistance: 4: Advertising account executive Details: Tactile cues for placement;Manual facilitation for Horticulturist, commercial: Both upper extremities Wheelchair Parts Management: Needs assistance Distance: 45f   Trunk/Postural Assessment  Cervical Assessment Cervical Assessment: Within Functional Limits Thoracic Assessment Thoracic Assessment: Exceptions to [facility](thor[facility] rounding noted) Lumbar Assessment Lumbar Assessment: Exceptions to [facility](posterior lumbar pelvic tilt in sitting)  Balance Balance Balance Assessed: Yes Static Sitting Balance Static Sitting - Balance Support: Feet unsupported Static Sitting - Level of Assistance: 7: Independent Dynamic Sitting Balance Dynamic Sitting - Balance Support: During functional activity Dynamic Sitting - Level of Assistance: 5: Stand by assistance Static Standing Balance Static Standing - Balance Support: Right upper extremity supported;Left upper extremity supported;During functional activity Static Standing - Level of Assistance: 5: Stand by assistance Dynamic Standing Balance Dynamic Standing - Balance Support: During functional activity Dynamic Standing - Level of Assistance: 4: Min assist Extremity Assessment      RLE Assessment RLE Assessment: Within Functional Limits LLE Assessment LLE Assessment: Exceptions to [facility] Strength LLE Overall Strength Comments: 3+/5 ankle DF and 4-/5 hip flexion. all others 4+/5 to 5/5    See Function Navigator for Current Functional Status.   Refer to Care Plan for Long Term Goals  Recommendations for other services: Therapeutic Recreation  Outing/community reintegration  Discharge Criteria: Patient will be discharged from PT if patient refuses treatment 3 consecutive times without medical reason, if treatment goals not met, if there is a change in medical status, if patient makes no progress towards goals or if patient is discharged from hospital.  The above assessment,  treatment plan, treatment alternatives and goals were discussed and mutually agreed upon: by patient and by family  ALorie Phenix1/01/2017, 10:48 PM

## 2016-10-20 NOTE — Evaluation (Signed)
Occupational Therapy Assessment and Plan  Patient Details  Name: [name] MRN: [id number] Date of Birth: [birth year]  OT Diagnosis: abnormal posture, hemiplegia affecting non-dominant side and muscle weakness (generalized) Rehab Potential: Rehab Potential (ACUTE ONLY): Excellent ELOS: 8-10 days   Today's Date: 10/20/2016 OT Individual Time: 7096-4383 OT Individual Time Calculation (min): 76 min      Problem List: Patient Active Problem List   Diagnosis Date Noted  . Gait disturbance, post-stroke 10/20/2016  . Hemiparesis affecting left side as late effect of stroke ([place]) 10/20/2016  . Essential hypertension 10/19/2016  . Hyperlipidemia LDL goal <70 10/19/2016  . 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[place]) 81/84/0375  . Left hemiparesis ([place])   . Atrial fibrillation ([place])   . History of fall   . History of traumatic subdural hematoma   . Vascular headache   . Hypokalemia   . Leukocytosis   . Acute blood loss anemia   . Hypoalbuminemia due to protein-calorie malnutrition ([place])   . Dysphagia, post-stroke   . Acute respiratory failure ([place])   . Acute embolic stroke (HCC) - R putamen/caudate and R insular infarcts s/p TICI3 revascularization w/ mechanical thrombectomy d/t AF not on [facility] 10/15/2016  . Pressure injury of skin 10/15/2016  . HCAP (healthcare-associated pneumonia) 11/09/2014  . Weakness 08/25/2014  . Acute bronchitis 08/25/2014  . UTI (lower urinary tract infection) 08/25/2014  . Fracture of lumbar spine ([place]) 08/25/2014  . Subdural hematoma, post-traumatic ([place]) 05/12/2014  . Chronic atrial fibrillation ([place]) 05/12/2014  . Diabetes mellitus ([place]) 05/12/2014  . OSA on CPAP 05/12/2014    Past Medical History:  Past Medical History:  Diagnosis Date  . Atrial fibrillation ([place])   . Chronic back pain    "mid back down into lower back" (08/25/2014)  . GERD (gastroesophageal reflux disease)   . Hypertriglyceridemia   . OSA on CPAP   . Osteoarthritis    "knees, hands, back"  (08/25/2014)  . Pneumonia ~ 2000 X 1  . Subdural hematoma ([place]) july 2015   S/P fall while on Coumadin  . T12 compression fracture ([place]) 2012  . Type II diabetes mellitus ([place])    Past Surgical History:  Past Surgical History:  Procedure Laterality Date  . APPENDECTOMY  2012  . CATARACT EXTRACTION W/ INTRAOCULAR LENS  IMPLANT, BILATERAL Bilateral 2000's  . IR GENERIC HISTORICAL  10/15/2016   IR PERCUTANEOUS ART THROMBECTOMY/INFUSION INTRACRANIAL INC DIAG ANGIO 10/15/2016 [name], MD [facility]-INTERV RAD  . RADIOLOGY WITH ANESTHESIA N/A 10/15/2016   Procedure: RADIOLOGY WITH ANESTHESIA;  Surgeon: [name], MD;  Location: [place];  Service: Radiology;  Laterality: N/A;  . TOTAL ABDOMINAL HYSTERECTOMY  1990    Assessment & Plan Clinical Impression: Patient is a 76 y.o. year old female with recent admission to the hospital on 10/15/2016 with left-sided weakness and facial droop.. Patient lives with daughterand son-in-law who work. She does have another daughter who checks on herbut cannot provide 24-hour care. Reported to be independent with assistive device prior to admission. Two-level home with bedroom on first floor and 3 steps to entry. Cranial CT scan showed positive hyperdense right MCA bifurcation suspicious for emergent large vessel occlusion. No intracranial hemorrhage. CT cerebral perfusion scan positive for emergent large vessel occlusion distal right M1 or right MCA bifurcation. Cerebral angiogram and underwent complete revascularization of occluded right MCA M1 segment per interventional radiology. MRI of the brain 10/16/2016 showed acute infarct affecting the right putamen and caudate .  Patient transferred to CIR on 10/19/2016 .  Patient currently requires min with basic self-care skills secondary to muscle weakness, impaired timing and sequencing and decreased coordination and decreased standing balance, hemiplegia and decreased balance strategies.  Prior to  hospitalization, patient could complete ADLs with modified independent .  Patient will benefit from skilled intervention to decrease level of assist with basic self-care skills and increase independence with basic self-care skills prior to discharge home with daughter and son-in-law.  Anticipate patient will require intermittent supervision and follow up outpatient.  OT - End of Session Activity Tolerance: Tolerates 30+ min activity with multiple rests Endurance Deficit: Yes OT Assessment Rehab Potential (ACUTE ONLY): Excellent Barriers to Discharge: Decreased caregiver support Barriers to Discharge Comments: Pt may be alone for short periods of time. OT Patient demonstrates impairments in the following area(s): Balance;Endurance;Safety;Motor OT Basic ADL's Functional Problem(s): Grooming;Bathing;Dressing;Toileting OT Advanced ADL's Functional Problem(s): Simple Meal Preparation OT Transfers Functional Problem(s): Toilet;Tub/Shower OT Additional Impairment(s): Fuctional Use of Upper Extremity OT Plan OT Intensity: Minimum of 1-2 x/day, 45 to 90 minutes OT Frequency: 5 out of 7 days OT Duration/Estimated Length of Stay: 8-10 days OT Treatment/Interventions: Balance/vestibular training;Community reintegration;Discharge planning;Self Care/advanced ADL retraining;Therapeutic Activities;UE/LE Coordination activities;Therapeutic Exercise;Patient/family education;Functional mobility training;DME/adaptive equipment instruction;Neuromuscular re-education;UE/LE Strength taining/ROM OT Self Feeding Anticipated Outcome(s): modified independent OT Basic Self-Care Anticipated Outcome(s): modified independent OT Toileting Anticipated Outcome(s): modified independent OT Bathroom Transfers Anticipated Outcome(s): modified independent to supervision level OT Recommendation Patient destination: Home Follow Up Recommendations: Home health OT Equipment Recommended: To be determined   Skilled Therapeutic  Intervention Pt began education on selfcare retraining sit to stand during session.  Her daughter was present for session as well and helped with translation as pt only speaks [place].  She completed shower for bathing sitting on shower seat.  Good functional use of the LUE noted during session with slight difficulty fastening buttons as well as threading button up sweater over the LUE and then pulling down in the back.  Educated pt's daughter on FM strengthening and theraputty exercises to be completed daily and will provide handout next visit.  Also issued green light resistance theraputty.  Discussed ELOS and expected goals of modified independent to supervision at discharge.  Pt left sitting in wheelchair with daughter present at end of session.    OT Evaluation Precautions/Restrictions  Precautions Precautions: Fall Restrictions Weight Bearing Restrictions: No  Pain  No report of pain  Home Living/Prior Functioning Home Living Family/patient expects to be discharged to:: Private residence Living Arrangements: Children Available Help at Discharge: Available PRN/intermittently, Family (lives with daughter who works, sister can come by) Type of Home: House Home Access: Stairs to enter Technical brewer of Steps: 3 Entrance Stairs-Rails: Right Home Layout: Two level, Able to live on main level with bedroom/bathroom Alternate Level Stairs-Number of Steps: 12 Alternate Level Stairs-Rails: Right Bathroom Shower/Tub: Chiropodist: Standard Bathroom Accessibility: Yes IADL History Homemaking Responsibilities: Yes Meal Prep Responsibility: Secondary Laundry Responsibility: Secondary Current License: No Occupation: Retired Prior Function Level of Independence: Requires assistive device for independence  Able to Take Stairs?: Yes Driving: No Vocation: Retired Comments: active in the home. rides stationary bike 15-20 min per day ADL  See Function Section of  chart for details  Vision/Perception  Vision- History Baseline Vision/History: Wears glasses Wears Glasses: Reading only Patient Visual Report: No change from baseline Vision- Assessment Vision Assessment?: No apparent visual deficits  Cognition Overall Cognitive Status: Within Functional Limits for tasks assessed Arousal/Alertness: Awake/alert Orientation Level: Person Year: 2018 Month: December Memory: Appears intact  Immediate Memory Recall: Sock;Blue;Bed Memory Recall: Sock;Blue;Bed Memory Recall Sock: Without Cue Memory Recall Blue: Without Cue Memory Recall Bed: Without Cue Attention: Focused;Sustained Focused Attention: Appears intact Sustained Attention: Appears intact Awareness: Appears intact Safety/Judgment: Appears intact Comments: Will continue to further assess in function. Sensation Sensation Light Touch: Impaired Detail Light Touch Impaired Details: Impaired LUE Stereognosis: Impaired Detail Stereognosis Impaired Details: Impaired LUE Hot/Cold: Not tested Proprioception: Appears Intact Additional Comments: Decreased light touch acuity at the left hand and digits compared to the right.   Coordination Gross Motor Movements are Fluid and Coordinated: No Fine Motor Movements are Fluid and Coordinated: No Coordination and Movement Description: Pt uses the LUE functionally for all selfcare tasks at a slower than normal level.  increased time for buttoning buttons but she was able to complete.  Motor  Motor Motor: Hemiplegia Motor - Skilled Clinical Observations: mild LUE and LLE hemiparesis Mobility  Transfers Transfers: Sit to Stand;Stand to Sit Sit to Stand: 4: Min assist;With armrests;With upper extremity assist Stand to Sit: 4: Min assist;With armrests;With upper extremity assist  Trunk/Postural Assessment  Cervical Assessment Cervical Assessment: Within Functional Limits Thoracic Assessment Thoracic Assessment: Exceptions to [facility] (thoracic rounding  noted) Lumbar Assessment Lumbar Assessment: Exceptions to [facility] (posterior lumbar pelvic tilt in sitting)  Balance Balance Balance Assessed: Yes Static Sitting Balance Static Sitting - Balance Support: Feet unsupported Static Sitting - Level of Assistance: 7: Independent Dynamic Sitting Balance Dynamic Sitting - Balance Support: During functional activity Dynamic Sitting - Level of Assistance: 5: Stand by assistance Static Standing Balance Static Standing - Balance Support: Right upper extremity supported;Left upper extremity supported;During functional activity Static Standing - Level of Assistance: 5: Stand by assistance Dynamic Standing Balance Dynamic Standing - Balance Support: During functional activity Dynamic Standing - Level of Assistance: 4: Min assist Extremity/Trunk Assessment RUE Assessment RUE Assessment: Within Functional Limits LUE Assessment LUE Assessment: Exceptions to [facility] (AROM shoulder flexion 0-160 degrees with strength at 3/5 level.  Elbow, wrist, and digits AROM WFLS with strength at 3+/5 overall.  Decreased FM coordination with fastening buttons and attempting to open small soap bottle. )   See Function Navigator for Current Functional Status.   Refer to Care Plan for Long Term Goals  Recommendations for other services: None    Discharge Criteria: Patient will be discharged from OT if patient refuses treatment 3 consecutive times without medical reason, if treatment goals not met, if there is a change in medical status, if patient makes no progress towards goals or if patient is discharged from hospital.  The above assessment, treatment plan, treatment alternatives and goals were discussed and mutually agreed upon: by patient and by family  Garrett Bowring OTR/L 10/20/2016, 1:55 PM

## 2016-10-20 NOTE — Progress Notes (Signed)
Social Work Assessment and Plan Social Work Assessment and Plan  Patient Details  Name: [name] MRN: [id number] Date of Birth: 04/17/1941  Today's Date: 10/20/2016  Problem List:  Patient Active Problem List   Diagnosis Date Noted  . Gait disturbance, post-stroke 10/20/2016  . Hemiparesis affecting left side as late effect of stroke (HCC) 10/20/2016  . Essential hypertension 10/19/2016  . Hyperlipidemia LDL goal <70 10/19/2016  . 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  . Left hemiparesis (HCC)   . Atrial fibrillation (HCC)   . History of fall   . History of traumatic subdural hematoma   . Vascular headache   . Hypokalemia   . Leukocytosis   . Acute blood loss anemia   . Hypoalbuminemia due to protein-calorie malnutrition (HCC)   . Dysphagia, post-stroke   . Acute respiratory failure (HCC)   . Acute embolic stroke (HCC) - R putamen/caudate and R insular infarcts s/p TICI3 revascularization w/ mechanical thrombectomy [facility] 10/15/2016  . Pressure injury of skin 10/15/2016  . HCAP (healthcare-associated pneumonia) 11/09/2014  . Weakness 08/25/2014  . Acute bronchitis 08/25/2014  . UTI (lower urinary tract infection) 08/25/2014  . Fracture of lumbar spine (HCC) 08/25/2014  . Subdural hematoma, post-traumatic (HCC) 05/12/2014  . Chronic atrial fibrillation (HCC) 05/12/2014  . Diabetes mellitus (HCC) 05/12/2014  . OSA on CPAP 05/12/2014   Past Medical History:  Past Medical History:  Diagnosis Date  . Atrial fibrillation (HCC)   . Chronic back pain    "mid back down into lower back" (08/25/2014)  . GERD (gastroesophageal reflux disease)   . Hypertriglyceridemia   . OSA on CPAP   . Osteoarthritis    "knees, hands, back" (08/25/2014)  . Pneumonia ~ 2000 X 1  . Subdural hematoma (HCC) july 2015   S/P fall while on Coumadin  . T12 compression fracture (HCC) 2012  . Type II diabetes mellitus (HCC)    Past Surgical History:  Past Surgical History:  Procedure  Laterality Date  . APPENDECTOMY  2012  . CATARACT EXTRACTION W/ INTRAOCULAR LENS  IMPLANT, BILATERAL Bilateral 2000's  . IR GENERIC HISTORICAL  10/15/2016   IR PERCUTANEOUS ART THROMBECTOMY/INFUSION INTRACRANIAL INC DIAG ANGIO 10/15/2016 Julieanne CottonSanjeev Deveshwar, MD [facility]-INTERV RAD  . RADIOLOGY WITH ANESTHESIA N/A 10/15/2016   Procedure: RADIOLOGY WITH ANESTHESIA;  Surgeon: Julieanne CottonSanjeev Deveshwar, MD;  Location: [facility] OR;  Service: Radiology;  Laterality: N/A;  . TOTAL ABDOMINAL HYSTERECTOMY  1990   Social History:  reports that she is a non-smoker but has been exposed to tobacco smoke. She has never used smokeless tobacco. She reports that she does not drink alcohol or use drugs.  Family / Support Systems Marital Status: Widow/Widower Patient Roles: Parent ChildrenCatarina [name]: [name]-daughter [phone]  [name]-daughter [phone]-cell Other Supports: [name]-son in-law [phone] Anticipated Caregiver: Two daughter's  Ability/Limitations of Caregiver: Daughtr whom she lives with-Jeralyn works part time as a Teacher, early years/prepharmacist and other daughter can check on pt. Both can not provide 24 hr care Caregiver Availability: Evenings only Family Dynamics: Very close with both daughter's who are involved and have assisted with her care. Pt was independent and helped some at home wiht daughter's family and children. She has temple members who are supportive and visit her in the hospital.  Social History Preferred language: [place] Religion: Buddhist Cultural Background: Pt is from [place]she speaks [place] and [place] and practices Buddhism. She does speak little [place] Education: School while in [place] Read: Yes ( her primary language) Write: Yes (her primary language) Employment Status: Retired  Legal Hisotry/Current Legal Issues: No issues Guardian/Conservator: None-according to MD pt is capable of making her own decisions while here. Her daughter will be present and able to assist her if any decision needs to be  made while here.   Abuse/Neglect Physical Abuse: Denies Verbal Abuse: Denies Sexual Abuse: Denies Exploitation of patient/patient's resources: Denies Self-Neglect: Denies  Emotional Status Pt's affect, behavior adn adjustment status: Pt is motivated to recover and wants to get back to her prior level of functioning. She would cook some and help with daughter's four children-school lunches and after school at home. Daughter reports she wants to do everything for herself , always has. Recent Psychosocial Issues: other health issues were managed by PCP. Was doing well before this stroke Pyschiatric History: No history-deferred depression screen due to tired from back to back therapies. Daughter reports she is one to push herself and she does talk with her and expresses her concerns. Both are hopeful about being on the rehab unit and the therapies she will get here. Substance Abuse History: No issues  Patient / Family Perceptions, Expectations & Goals Pt/Family understanding of illness & functional limitations: Daughter talks to the MD and conveys information to Mom, they do feel their concerns and questions are being addressed and answered. Daughter is a Teacher, early years/pre by trade and understands much of the medical information. Premorbid pt/family roles/activities: Mom, Surveyor, minerals, retiree, temple member, etc Anticipated changes in roles/activities/participation: resume Pt/family expectations/goals: Pt states: " I want to do well here." Daughter states: " I hope she can be safe to be alone while work we don't have 24 hour care for her."  [employer]: None Premorbid Home Care/DME Agencies: None Transportation available at discharge: Daughter's Resource referrals recommended: Support group (specify)  Discharge Planning Living Arrangements: Children Support Systems: Children, Psychologist, clinical community Type of Residence: Private residence Insurance Resources: Armed forces operational officer,  [employer] (specify county) Architect: SSI, Family Support Financial Screen Referred: Previously completed Living Expenses: Lives with family Money Management: Family Does the patient have any problems obtaining your medications?: No Home Management: Daughter and pt helped some. along with her other daughter Patient/Family Preliminary Plans: Return home with daughter and other daughter assisting some and checking on her while Mellissa works. Daughter hopeful she will be mod/i so she can be alone while she is working. Will await team's evaluations and see the goals they feel are realistic goals while here. Daughter is here to observe and interpret for Mom today.  Social Work Anticipated Follow Up Needs: HH/OP, Support Group  Clinical Impression Pleasant female daughter here to interpret for Mom, will get an interpretor when daughter has to work, she will Biomedical engineer know in advance when this is. Daughter reports it was discussed prior to admission with Admission Coordinator That she would be Mom's interpretor when here, which will be when not working. Both hopeful she will be mod/i and informed admission coordinator she would be when leaves here. Informed daughter concerns of reaching mod/i due to left inattention and diet currently on honey thick for her to reach mod/i and be safe at home alone while she works. Other daughter will check in on her but not stay with her, while sister works. Will work on a safe discharge plan and work with daughter on a realistic plan.   Lucy Chris 10/20/2016, 2:50 PM

## 2016-10-20 NOTE — Progress Notes (Signed)
Patient information reviewed and entered into eRehab system by Thomasa Heidler, RN, CRRN, PPS Coordinator.  Information including medical coding and functional independence measure will be reviewed and updated through discharge.     Per nursing patient was given "Data Collection Information Summary for Patients in Inpatient Rehabilitation Facilities with attached "Privacy Act Statement-Health Care Records" upon admission.  

## 2016-10-21 ENCOUNTER — Inpatient Hospital Stay (HOSPITAL_COMMUNITY): Payer: Medicare Other | Admitting: Physical Therapy

## 2016-10-21 ENCOUNTER — Inpatient Hospital Stay (HOSPITAL_COMMUNITY): Payer: Medicare Other | Admitting: Speech Pathology

## 2016-10-21 ENCOUNTER — Inpatient Hospital Stay (HOSPITAL_COMMUNITY): Payer: Medicare Other | Admitting: Occupational Therapy

## 2016-10-21 ENCOUNTER — Ambulatory Visit (HOSPITAL_COMMUNITY): Payer: Medicare Other | Admitting: Speech Pathology

## 2016-10-21 DIAGNOSIS — I69354 Hemiplegia and hemiparesis following cerebral infarction affecting left non-dominant side: Secondary | ICD-10-CM

## 2016-10-21 LAB — GLUCOSE, CAPILLARY
GLUCOSE-CAPILLARY: 153 mg/dL — AB (ref 65–99)
GLUCOSE-CAPILLARY: 86 mg/dL (ref 65–99)
Glucose-Capillary: 109 mg/dL — ABNORMAL HIGH (ref 65–99)
Glucose-Capillary: 131 mg/dL — ABNORMAL HIGH (ref 65–99)
Glucose-Capillary: 127 mg/dL — ABNORMAL HIGH (ref 65–99)

## 2016-10-21 MED ORDER — SODIUM CHLORIDE 0.45 % IV SOLN
INTRAVENOUS | Status: DC
  Administered 2016-10-21 – 2016-10-22 (×2): via INTRAVENOUS

## 2016-10-21 NOTE — Progress Notes (Signed)
Physical Therapy Session Note  Patient Details  Name: [name] MRN: [id number] Date of Birth: [birth year]  Today's Date: 10/21/2016 PT Individual Time: 0900-1000 PT Individual Time Calculation (min): 60 min    Short Term Goals: Week 1:  PT Short Term Goal 1 (Week 1): STG = LTG due to estimated length of stay   Skilled Therapeutic Interventions/Progress Updates:     Pt received sitting in WC and agreeable to PT  WC mobility with min assist x 31f. Moderate cues for improved use of the LUE to prevent veer to the L.   Gait training without AD and min assist from PT x1049f With RW for 12592fnd 65f47fth min-superivsion assist. Cues for increased step length and decreased cadence to prevent anterior LOB.   PT administered Berg balance test; see below for details.  Patient demonstrates increased fall risk as noted by score of   34/56 on Berg Balance Scale.  (<36= high risk for falls, close to 100%; 37-45 significant >80%; 46-51 moderate >50%; 52-55 lower >25%)   Standing balance on airex: Quiet stance 2 bouts x 30 seconds without UE support.  Lateral reaching x 8 BUE crossbody reachs x 12 BUE.  PT provided multimodal cues for improved use of ankle strategy to prevent LOB. Min assist progressing to close supervision assist from  PT.   Patient returned too room and [facility] reach and all needs met. Daughter also present.       Therapy Documentation Precautions:  Precautions Precautions: Fall Restrictions Weight Bearing Restrictions: No    Pain: Pain Assessment Pain Assessment: 0-10 Pain Score: 0-No pain  Balance: Balance Balance Assessed: Yes Standardized Balance Assessment Standardized Balance Assessment: Berg Balance Test Berg Balance Test Sit to Stand: Able to stand  independently using hands Standing Unsupported: Able to stand safely 2 minutes Sitting with Back Unsupported but Feet Supported on Floor or Stool: Able to sit safely and  securely 2 minutes Stand to Sit: Controls descent by using hands Transfers: Able to transfer safely, definite need of hands Standing Unsupported with Eyes Closed: Able to stand 10 seconds with supervision Standing Ubsupported with Feet Together: Needs help to attain position and unable to hold for 15 seconds From Standing, Reach Forward with Outstretched Arm: Can reach forward >12 cm safely (5") From Standing Position, Pick up Object from Floor: Able to pick up shoe, needs supervision From Standing Position, Turn to Look Behind Over each Shoulder: Turn sideways only but maintains balance Turn 360 Degrees: Able to turn 360 degrees safely but slowly Standing Unsupported, Alternately Place Feet on Step/Stool: Able to complete >2 steps/needs minimal assist Standing Unsupported, One Foot in Front: Able to take small step independently and hold 30 seconds Standing on One Leg: Tries to lift leg/unable to hold 3 seconds but remains standing independently Total Score: 34      See Function Navigator for Current Functional Status.   Therapy/Group: Individual Therapy  AustLorie Phenix/2018, 10:03 AM

## 2016-10-21 NOTE — Progress Notes (Signed)
Occupational Therapy Session Note  Patient Details  Name: [name] MRN: [id number] Date of Birth: 09/03/1941  Today's Date: 10/21/2016 OT Individual Time: 1045-1200 OT Individual Time Calculation (min): 75 min   Short Term Goals: Week 1:  OT Short Term Goal 1 (Week 1): LTGs equal to STGS set at overall supervision to modified independent level.   Skilled Therapeutic Interventions/Progress Updates:   1:1 Daughter present to interpret for session. Pt asleep when arrived and reports fatigue but willing to work. Pt participated in self care retraining at sink level (pt's choice). Pt ambulated around room and in and out of bathroom with HHA with min A. Pt performed bathing, toileting and dressing sit to stand with close supervision. Pt able to perform some ADL tasks in standing with either one UE support or no UE support with close supervision. Pt continues to require frequent rest breaks due to fatigue but always willing to continue. Pt ambulated from room to chairs outside elevators with "light " steadying A with VC and tactile cues for left LE adduction and foot clearance. In gym focus on therapeutic activity to address balance and balance reactions with tactile cues and support for weight shifts in different directions. Left in room with recliner in prep for lunch with daughter.   Therapy Documentation Precautions:  Precautions Precautions: Fall Restrictions Weight Bearing Restrictions: No Pain: No c/o pain in session  See Function Navigator for Current Functional Status.   Therapy/Group: Individual Therapy  Roney MansSmith, [facility] 10/21/2016, 1:22 PM

## 2016-10-21 NOTE — Progress Notes (Signed)
1330 pt sitting in chair, reported "tired" and wanted to go back to bed. Patted chest once transferred from chair to bed and seated. Dtr noted pt reports heart feels funny (described as fluttering of heart) with activity like she had just hiked or walked along way. Not just few steps. Once lying in bed, pt reported fluttering subsided, denied chest pain or discomfort in arm or head or other, sleepy but arouses and responds appropriately. BP little lower than noted previously. Pt has been drinking fluids and eating well today. lft LE/foot little cooler to touch than right side and pulse noted. Denied changes in sensation to left LE. Dan [place] Notified of findings. Pamelia HoitSharp, Lincoln Kleiner B

## 2016-10-21 NOTE — Progress Notes (Signed)
Subjective/Complaints: Patient was able to ask staff to assist her to bathroom, had bowel movement this morning. Ambulated with hand-held assistance.  Review systems: Limited by language, daughter, not at bedside today Objective: Vital Signs: Blood pressure 116/75, pulse 88, temperature 97.5 F (36.4 C), temperature source Oral, resp. rate 18, height '4\' 10"'  (1.473 m), weight 59.7 kg (131 lb 9.6 oz), SpO2 98 %. No results found. Results for orders placed or performed during the hospital encounter of 10/19/16 (from the past 72 hour(s))  Glucose, capillary     Status: Abnormal   Collection Time: 10/19/16  8:47 PM  Result Value Ref Range   Glucose-Capillary 157 (H) 65 - 99 mg/dL  CBC WITH DIFFERENTIAL     Status: Abnormal   Collection Time: 10/20/16  5:53 AM  Result Value Ref Range   WBC 7.6 4.0 - 10.5 K/uL   RBC 3.86 (L) 3.87 - 5.11 MIL/uL   Hemoglobin 12.0 12.0 - 15.0 g/dL   HCT 35.3 (L) 36.0 - 46.0 %   MCV 91.5 78.0 - 100.0 fL   MCH 31.1 26.0 - 34.0 pg   MCHC 34.0 30.0 - 36.0 g/dL   RDW 12.0 11.5 - 15.5 %   Platelets 331 150 - 400 K/uL   Neutrophils Relative % 69 %   Neutro Abs 5.3 1.7 - 7.7 K/uL   Lymphocytes Relative 16 %   Lymphs Abs 1.2 0.7 - 4.0 K/uL   Monocytes Relative 8 %   Monocytes Absolute 0.6 0.1 - 1.0 K/uL   Eosinophils Relative 6 %   Eosinophils Absolute 0.4 0.0 - 0.7 K/uL   Basophils Relative 1 %   Basophils Absolute 0.0 0.0 - 0.1 K/uL  Comprehensive metabolic panel     Status: Abnormal   Collection Time: 10/20/16  5:53 AM  Result Value Ref Range   Sodium 140 135 - 145 mmol/L   Potassium 3.6 3.5 - 5.1 mmol/L   Chloride 108 101 - 111 mmol/L   CO2 23 22 - 32 mmol/L   Glucose, Bld 120 (H) 65 - 99 mg/dL   BUN 7 6 - 20 mg/dL   Creatinine, Ser 0.60 0.44 - 1.00 mg/dL   Calcium 8.9 8.9 - 10.3 mg/dL   Total Protein 6.2 (L) 6.5 - 8.1 g/dL   Albumin 3.3 (L) 3.5 - 5.0 g/dL   AST 16 15 - 41 U/L   ALT 10 (L) 14 - 54 U/L   Alkaline Phosphatase 35 (L) 38 - 126 U/L    Total Bilirubin 0.4 0.3 - 1.2 mg/dL   GFR calc non Af Amer >60 >60 mL/min   GFR calc Af Amer >60 >60 mL/min    Comment: (NOTE) The eGFR has been calculated using the CKD EPI equation. This calculation has not been validated in all clinical situations. eGFR's persistently <60 mL/min signify possible Chronic Kidney Disease.    Anion gap 9 5 - 15  Glucose, capillary     Status: Abnormal   Collection Time: 10/20/16  6:34 AM  Result Value Ref Range   Glucose-Capillary 119 (H) 65 - 99 mg/dL  Glucose, capillary     Status: None   Collection Time: 10/20/16 12:04 PM  Result Value Ref Range   Glucose-Capillary 75 65 - 99 mg/dL     HEENT: normal Cardio: RRR and No murmur or extra sounds Resp: CTA B/L and Unlabored GI: BS positive and Nontender, nondistended Extremity:  No Edema Skin:   Intact Neuro: Alert/Oriented, Abnormal Sensory Reports reduced LT sensation  on Left hand compared to Right, Abnormal Motor 4- Left delt Bi Tri Grip, 4+ Left HF, KE, 3- L ankle DF, Abnormal FMC Ataxic/ dec FMC and Tone:  Within Normal Limits Musc/Skel:  Other ful ROM in UEs, no pain with LE AROM Gen NAD   Assessment/Plan: 1. 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 Physiatrist is providing close team supervision and 24 hour management of active medical problems listed below. Physiatrist and rehab team continue to assess barriers to discharge/monitor patient progress toward functional and medical goals. FIM: Function - Bathing Bathing activity did not occur: N/A Body parts bathed by patient: Right arm, Left arm, Chest, Abdomen, Front perineal area, Buttocks, Right upper leg, Left upper leg, Right lower leg, Left lower leg, Back Assist Level: Touching or steadying assistance(Pt > 75%)  Function- Upper Body Dressing/Undressing Upper body dressing/undressing activity  did not occur: N/A What is the patient wearing?: Pull over shirt/dress, Button up shirt Pull over shirt/dress - Perfomed by patient: Thread/unthread right sleeve, Thread/unthread left sleeve, Put head through opening, Pull shirt over trunk Button up shirt - Perfomed by patient: Thread/unthread right sleeve, Button/unbutton shirt Button up shirt - Perfomed by helper: Thread/unthread left sleeve, Pull shirt around back Function - Lower Body Dressing/Undressing Lower body dressing/undressing activity did not occur: N/A What is the patient wearing?: Pants, Underwear, Non-skid slipper socks Position: Wheelchair/chair at sink Underwear - Performed by patient: Thread/unthread right underwear leg, Thread/unthread left underwear leg, Pull underwear up/down Pants- Performed by patient: Thread/unthread right pants leg, Thread/unthread left pants leg, Pull pants up/down Non-skid slipper socks- Performed by patient: Don/doff right sock, Don/doff left sock Assist for lower body dressing: Touching or steadying assistance (Pt > 75%)  Function - Toileting Toileting steps completed by patient: Adjust clothing prior to toileting, Performs perineal hygiene, Adjust clothing after toileting Toileting Assistive Devices: Grab bar or rail  Function - Air cabin crew transfer assistive device: Bedside commode, Drop arm commode Assist level to toilet: Touching or steadying assistance (Pt > 75%) Assist level from toilet: Touching or steadying assistance (Pt > 75%)  Function - Chair/bed transfer Chair/bed transfer activity did not occur: N/A Chair/bed transfer method: Stand pivot Chair/bed transfer assist level: Touching or steadying assistance (Pt > 75%) Chair/bed transfer assistive device: Armrests  Function - Locomotion: Wheelchair Will patient use wheelchair at discharge?: No Type: Manual Max wheelchair distance: 64f  Assist Level: Touching or steadying assistance (Pt > 75%) Assist Level: Touching  or steadying assistance (Pt > 75%) Wheel 150 feet activity did not occur: Safety/medical concerns Turns around,maneuvers to table,bed, and toilet,negotiates 3% grade,maneuvers on rugs and over doorsills: No Function - Locomotion: Ambulation Assistive device: No device Max distance: 712f Assist level: Touching or steadying assistance (Pt > 75%) Assist level: Touching or steadying assistance (Pt > 75%) Assist level: Touching or steadying assistance (Pt > 75%) Walk 150 feet activity did not occur: Refused Assist level: Touching or steadying assistance (Pt > 75%)  Function - Comprehension Comprehension: Auditory Comprehension assist level: Follows basic conversation/direction with no assist  Function - Expression Expression: Verbal Expression assist level: Expresses basic needs/ideas: With no assist  Function - Social Interaction Social Interaction assist level: Interacts appropriately 90% of the time - Needs monitoring or encouragement for participation or interaction.  Function - Problem Solving Problem solving assist level: Solves basic 75 - 89% of the time/requires cueing 10 - 24%  of the time  Function - Memory Memory assist level: Recognizes or recalls 90% of the time/requires cueing < 10% of the time Patient normally able to recall (first 3 days only): Current season, That he or she is in a hospital  Medical Problem List and Plan: 1.  Left hemiplegia, dysphagia secondary to right putamen caudate and right insular infarct status post revascularization right M1 with mechanical thrombectomy Continue CI are, PT, OT, speech 2.  DVT Prophylaxis/Anticoagulation: Subcutaneous Lovenox. Monitor for any bleeding episodes 3. Pain Management/headaches: Fioricet as needed 4. Dysphagia. Dysphagia #1 Honey thick liquid diet. Monitor hydration. Follow-up speech therapy, 5. Neuropsych: This patient is capable of making decisions on her own behalf. 6. Skin/Wound Care: Routine skin checks 7.  Fluids/Electrolytes/Nutrition: Routine I&O with follow-up chemistries, intake 75-100% of meals, 840 mL fluid on 10/20/2016 8. Diabetes mellitus peripheral neuropathy. Hemoglobin A1c 6.1.SSI. Check blood sugars before meals and at bedtime. Patient on Glucophage 500 mg twice a day prior to admission, no need to resume at the current time CBG (last 3)   Recent Labs  10/19/16 2047 10/20/16 0634 10/20/16 1204  GLUCAP 157* 119* 75    9. Atrial fibrillation. Patient on aspirin 81 mg daily prior to admission and monitor closely for history of falls with SDH. Cardiac rate controlled. Await possible plan to begin Eliquis 5 mg twice a day 14 days after stroke Per daughter, patient had prior history of small subdural hematoma after a fall in 2015. This was why she was taken off anticoagulation. 10.  Hypoalb pro-stat, twice a day LOS (Days) 2 A FACE TO FACE EVALUATION WAS PERFORMED  Spyros Winch E 10/21/2016, 8:31 AM

## 2016-10-21 NOTE — Progress Notes (Signed)
Speech Language Pathology Daily Make-up Session Note  Patient Details  Name: [name] MRN: [id number] Date of Birth: 01/01/1941  Today's Date: 10/21/2016 SLP Individual Time: 1400-1430 SLP Individual Time Calculation (min): 30 min   Short Term Goals: Week 1: SLP Short Term Goal 1 (Week 1): STG=LTG due to ELOS   Skilled Therapeutic Interventions: Skilled treatment session focused on dysphagia goals. Upon arrival, patient was asleep in bed and required extra time and Min A verbal cues for alertness. SLP facilitated session by educating the patient and her daughter on how to appropriately complete pharyngeal strengthening exercises. Patient was able to perform the effortful swallow and chin tuck against resistance exercises but unable to perform Masako despite Max A multimodal cues. SLP also facilitated session by administering trials of Dys. 2 textures. Patient demonstrated efficient mastication with minimal oral residue with overt cough X 1. However, patient with coughing prior to PO trials so difficult to determine if coughing related to swallowing. Recommend continued trials with SLP only. Patient left upright in bed with all needs within reach. Continue with current plan of care.   Function:  Eating Eating   Modified Consistency Diet: Yes Eating Assist Level: Supervision or verbal cues;Set up assist for   Eating Set Up Assist For: Opening containers       Cognition Comprehension Comprehension assist level: Follows basic conversation/direction with no assist  Expression   Expression assist level: Expresses basic needs/ideas: With no assist  Social Interaction Social Interaction assist level: Interacts appropriately 90% of the time - Needs monitoring or encouragement for participation or interaction.  Problem Solving Problem solving assist level: Solves basic 75 - 89% of the time/requires cueing 10 - 24% of the time  Memory Memory assist level: Recognizes or recalls 75 - 89% of the  time/requires cueing 10 - 24% of the time    Pain Pain Assessment Pain Assessment: No/denies pain   Therapy/Group: Individual Therapy  [name] 10/21/2016, 3:36 PM

## 2016-10-21 NOTE — Progress Notes (Signed)
Social Work Patient ID: [name], female   DOB: 05/26/1941, 76 y.o.   MRN: [id number]  Have arranged interpreter for Tuesday and Thursday at 8:00-10:00 and 1:00-2:15 pm for both days. Otherwise daughter will be here during therapies for pt. Made aware short length of stay and can see she is doing well hopefully will reach mod/i level for home.

## 2016-10-21 NOTE — Progress Notes (Signed)
Speech Language Pathology Daily Session Note  Patient Details  Name: [name] MRN: [id number] Date of Birth: 12/24/1940  Today's Date: 10/21/2016 SLP Individual Time: 0800-0900 SLP Individual Time Calculation (min): 60 min   Short Term Goals: Week 1: SLP Short Term Goal 1 (Week 1): STG=LTG due to ELOS   Skilled Therapeutic Interventions:  Pt was seen for skilled ST targeting goals for dysphagia and cognition.  Pt consumed dys 1 textures and honey thick liquids via cup sips during breakfast meal with overall supervision cues for use of swallowing precautions.  Pt with somewhat fast rate of intake when consuming grits which resulted in x1 episode of coughing.  No overt s/s of aspiration evident with small, controlled bites of purees or with cup sips of honey thick liquids.  Pt also noted to have what appeared to be more timely swallow initiation which SLP suspects to be a function of improved alertness.  Pt demonstrated intermittent inattention to objects on her left side when completing basic, sinkside ADLs,  Therapist utilized the Dyn[employer] for further diagnostic assessment of visual scanning given right sided brain dysfunction.  Pt demonstrated no significant differences for locating objects in the right visual field versus the left.  Pt needed min instructional cues for return demonstration of applying brakes to wheelchair at the end of today's therapy session. Pt left sitting in wheelchair with daughter at bedside.  Continue per current plan of care.       Function:  Eating Eating   Modified Consistency Diet: Yes Eating Assist Level: Supervision or verbal cues;Set up assist for   Eating Set Up Assist For: Opening containers       Cognition Comprehension Comprehension assist level: Follows basic conversation/direction with no assist  Expression   Expression assist level: Expresses basic needs/ideas: With no assist  Social Interaction Social Interaction assist level: Interacts  appropriately 90% of the time - Needs monitoring or encouragement for participation or interaction.  Problem Solving Problem solving assist level: Solves basic 75 - 89% of the time/requires cueing 10 - 24% of the time  Memory Memory assist level: Recognizes or recalls 75 - 89% of the time/requires cueing 10 - 24% of the time    Pain Pain Assessment Pain Assessment: No/denies pain   Therapy/Group: Individual Therapy  Reyann Troop, Melanee SpryNicole L 10/21/2016, 12:24 PM

## 2016-10-22 ENCOUNTER — Inpatient Hospital Stay (HOSPITAL_COMMUNITY): Payer: Medicare Other | Admitting: Physical Therapy

## 2016-10-22 ENCOUNTER — Inpatient Hospital Stay (HOSPITAL_COMMUNITY): Payer: Medicare Other | Admitting: Speech Pathology

## 2016-10-22 ENCOUNTER — Inpatient Hospital Stay (HOSPITAL_COMMUNITY): Payer: Medicare Other

## 2016-10-22 NOTE — Progress Notes (Signed)
Subjective/Complaints: Daughter notes an episode yesterday when the patient felt like she had increased weakness in the evening when ambulating to the bathroom. Also noted the left foot looking purplish, nursing check pulses which were normal in the left foot. Patient does not have any left foot pain or swelling.  Review systems: Limited by language, daughter, with the patient today. Patient denies any pains, no breathing problems, no bowel or bladder problems. Objective: Vital Signs: Blood pressure 130/80, pulse 83, temperature 98.2 F (36.8 C), temperature source Oral, resp. rate 18, height _0  (1.473 m), weight 59.7 kg (131 lb 9.6 oz), SpO2 96 %. No results found. Results for orders placed or performed during the hospital encounter of 10/19/16 (from the past 72 hour(s))  Glucose, capillary     Status: Abnormal   Collection Time: 10/19/16  8:47 PM  Result Value Ref Range   Glucose-Capillary 157 (H) 65 - 99 mg/dL  CBC WITH DIFFERENTIAL     Status: Abnormal   Collection Time: 10/20/16  5:53 AM  Result Value Ref Range   WBC 7.6 4.0 - 10.5 K/uL   RBC 3.86 (L) 3.87 - 5.11 MIL/uL   Hemoglobin 12.0 12.0 - 15.0 g/dL   HCT 35.3 (L) 36.0 - 46.0 %   MCV 91.5 78.0 - 100.0 fL   MCH 31.1 26.0 - 34.0 pg   MCHC 34.0 30.0 - 36.0 g/dL   RDW 12.0 11.5 - 15.5 %   Platelets 331 150 - 400 K/uL   Neutrophils Relative % 69 %   Neutro Abs 5.3 1.7 - 7.7 K/uL   Lymphocytes Relative 16 %   Lymphs Abs 1.2 0.7 - 4.0 K/uL   Monocytes Relative 8 %   Monocytes Absolute 0.6 0.1 - 1.0 K/uL   Eosinophils Relative 6 %   Eosinophils Absolute 0.4 0.0 - 0.7 K/uL   Basophils Relative 1 %   Basophils Absolute 0.0 0.0 - 0.1 K/uL  Comprehensive metabolic panel     Status: Abnormal   Collection Time: 10/20/16  5:53 AM  Result Value Ref Range   Sodium 140 135 - 145 mmol/L   Potassium 3.6 3.5 - 5.1 mmol/L   Chloride 108 101 - 111 mmol/L   CO2 23 22 - 32 mmol/L   Glucose, Bld 120 (H) 65 - 99 mg/dL   BUN 7 6 - 20  mg/dL   Creatinine, Ser 0.60 0.44 - 1.00 mg/dL   Calcium 8.9 8.9 - 10.3 mg/dL   Total Protein 6.2 (L) 6.5 - 8.1 g/dL   Albumin 3.3 (L) 3.5 - 5.0 g/dL   AST 16 15 - 41 U/L   ALT 10 (L) 14 - 54 U/L   Alkaline Phosphatase 35 (L) 38 - 126 U/L   Total Bilirubin 0.4 0.3 - 1.2 mg/dL   GFR calc non Af Amer >60 >60 mL/min   GFR calc Af Amer >60 >60 mL/min    Comment: (NOTE) The eGFR has been calculated using the CKD EPI equation. This calculation has not been validated in all clinical situations. eGFR's persistently <60 mL/min signify possible Chronic Kidney Disease.    Anion gap 9 5 - 15  Glucose, capillary     Status: Abnormal   Collection Time: 10/20/16  6:34 AM  Result Value Ref Range   Glucose-Capillary 119 (H) 65 - 99 mg/dL  Glucose, capillary     Status: None   Collection Time: 10/20/16 12:04 PM  Result Value Ref Range   Glucose-Capillary 75 65 - 99 mg/dL  Glucose, capillary     Status: Abnormal   Collection Time: 10/20/16  4:53 PM  Result Value Ref Range   Glucose-Capillary 109 (H) 65 - 99 mg/dL  Glucose, capillary     Status: Abnormal   Collection Time: 10/20/16  7:39 PM  Result Value Ref Range   Glucose-Capillary 131 (H) 65 - 99 mg/dL  Glucose, capillary     Status: Abnormal   Collection Time: 10/20/16 10:03 PM  Result Value Ref Range   Glucose-Capillary 153 (H) 65 - 99 mg/dL   Comment 1 Notify RN   Glucose, capillary     Status: Abnormal   Collection Time: 10/21/16  6:39 AM  Result Value Ref Range   Glucose-Capillary 127 (H) 65 - 99 mg/dL   Comment 1 Notify RN   Glucose, capillary     Status: None   Collection Time: 10/21/16 11:38 AM  Result Value Ref Range   Glucose-Capillary 86 65 - 99 mg/dL     HEENT: normal Cardio: RRR and No murmur or extra sounds Resp: CTA B/L and Unlabored GI: BS positive and Nontender, nondistended Extremity:  No Edema Skin:   Intact Neuro: Alert/Oriented, Abnormal Sensory Reports reduced LT sensation on Left hand compared to Right,  Abnormal Motor 4- Left delt Bi Tri Grip, 4+ Left HF, KE, 3- L ankle DF, Abnormal FMC Ataxic/ dec FMC and Tone:  Within Normal Limits Musc/Skel:  Other ful ROM in UEs, no pain with LE AROM Gen NAD   Assessment/Plan: 1. 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 Physiatrist is providing close team supervision and 24 hour management of active medical problems listed below. Physiatrist and rehab team continue to assess barriers to discharge/monitor patient progress toward functional and medical goals. FIM: Function - Bathing Bathing activity did not occur: N/A Position: Wheelchair/chair at sink Body parts bathed by patient: Right arm, Left arm, Chest, Abdomen, Front perineal area, Buttocks, Right upper leg, Left upper leg, Right lower leg, Left lower leg, Back Assist Level: Supervision or verbal cues  Function- Upper Body Dressing/Undressing Upper body dressing/undressing activity did not occur: N/A What is the patient wearing?: Pull over shirt/dress, Button up shirt, Bra Bra - Perfomed by patient: Thread/unthread right bra strap, Thread/unthread left bra strap, Hook/unhook bra (pull down sports bra) (with extra time and instruction) Pull over shirt/dress - Perfomed by patient: Thread/unthread right sleeve, Thread/unthread left sleeve, Put head through opening, Pull shirt over trunk Button up shirt - Perfomed by patient: Thread/unthread right sleeve, Button/unbutton shirt Button up shirt - Perfomed by helper: Thread/unthread left sleeve, Pull shirt around back Assist Level: Supervision or verbal cues, More than reasonable time Function - Lower Body Dressing/Undressing Lower body dressing/undressing activity did not occur: N/A What is the patient wearing?: Pants, Underwear, Non-skid slipper socks Position: Wheelchair/chair at sink Underwear - Performed by  patient: Thread/unthread right underwear leg, Thread/unthread left underwear leg, Pull underwear up/down Pants- Performed by patient: Thread/unthread right pants leg, Thread/unthread left pants leg, Pull pants up/down Non-skid slipper socks- Performed by patient: Don/doff right sock, Don/doff left sock Assist for footwear: Setup Assist for lower body dressing: Supervision or verbal cues  Function - Toileting Toileting steps completed by patient: Adjust clothing prior to toileting, Performs perineal hygiene, Adjust clothing after toileting Toileting Assistive Devices: Grab bar or rail Assist level: Touching or steadying assistance (Pt.75%)  Function - Air cabin crew transfer assistive device:  Bedside commode, Drop arm commode Assist level to toilet: Touching or steadying assistance (Pt > 75%) Assist level from toilet: Touching or steadying assistance (Pt > 75%)  Function - Chair/bed transfer Chair/bed transfer activity did not occur: N/A Chair/bed transfer method: Stand pivot Chair/bed transfer assist level: Touching or steadying assistance (Pt > 75%) Chair/bed transfer assistive device: Armrests  Function - Locomotion: Wheelchair Will patient use wheelchair at discharge?: No Type: Manual Max wheelchair distance: 14f  Assist Level: Touching or steadying assistance (Pt > 75%) Assist Level: Touching or steadying assistance (Pt > 75%) Wheel 150 feet activity did not occur: Safety/medical concerns Turns around,maneuvers to table,bed, and toilet,negotiates 3% grade,maneuvers on rugs and over doorsills: No Function - Locomotion: Ambulation Assistive device: No device Max distance: 768f Assist level: Touching or steadying assistance (Pt > 75%) Assist level: Touching or steadying assistance (Pt > 75%) Assist level: Touching or steadying assistance (Pt > 75%) Walk 150 feet activity did not occur: Refused Assist level: Touching or steadying assistance (Pt > 75%)  Function -  Comprehension Comprehension: Auditory Comprehension assist level: Follows basic conversation/direction with no assist  Function - Expression Expression: Verbal Expression assist level: Expresses basic needs/ideas: With no assist  Function - Social Interaction Social Interaction assist level: Interacts appropriately 90% of the time - Needs monitoring or encouragement for participation or interaction.  Function - Problem Solving Problem solving assist level: Solves basic 75 - 89% of the time/requires cueing 10 - 24% of the time  Function - Memory Memory assist level: Recognizes or recalls 75 - 89% of the time/requires cueing 10 - 24% of the time Patient normally able to recall (first 3 days only): Current season, Location of own room, Staff names and faces, That he or she is in a hospital  Medical Problem List and Plan: 1.  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 This can cause some temporary weakness on the affected limb 2.  DVT Prophylaxis/Anticoagulation: Subcutaneous Lovenox. Monitor for any bleeding episodes 3. Pain Management/headaches: Fioricet as needed 4. Dysphagia. Dysphagia #1 Honey thick liquid diet. Monitor hydration. Follow-up speech therapy, 5. Neuropsych: This patient is capable of making decisions on her own behalf. 6. Skin/Wound Care: Routine skin checks 7. Fluids/Electrolytes/Nutrition: Routine I&O with follow-up chemistries, intake 25-100% of meals, 960 mL fluid on 10/21/2016 8. Diabetes mellitus peripheral neuropathy. Hemoglobin A1c 6.1.SSI. Check blood sugars before meals and at bedtime. Patient on Glucophage 500 mg twice a day prior to admission, no need to resume at the current time CBG (last 3)   Recent Labs  10/20/16 2203 10/21/16 0639 10/21/16 1138   GLUCAP 153* 127* 86    9. Atrial fibrillation. Patient on aspirin 81 mg daily prior to admission and monitor closely for history of falls with SDH. Cardiac rate controlled. Await possible plan to begin Eliquis 5 mg twice a day 14 days after stroke Per daughter, patient had prior history of small subdural hematoma after a fall in 2015. This was why she was taken off anticoagulation.We will need to discuss with neurology prior to reinitiation, fall risk is even higher now 10.  Hypoalb pro-stat, twice a day LOS (Days) 3 A FACE TO FACE EVALUATION WAS PERFORMED  Birdella Sippel E 10/22/2016, 11:10 AM

## 2016-10-22 NOTE — IPOC Note (Signed)
Overall Plan of Care [facility]) Patient Details Name: [name] MRN: [id number] DOB: 06-21-1941  Admitting Diagnosis: R CVA  Hospital Problems: Active Problems:   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HCC)   Gait disturbance, post-stroke   Hemiparesis affecting left side as late effect of stroke (HCC)     Functional Problem List: Nursing Endurance, Medication Management, Nutrition, Pain, Safety, Skin Integrity  PT Balance, Endurance, Motor, Perception, Safety, Sensory  OT Balance, Endurance, Safety, Motor  SLP Cognition, Nutrition  TR         Basic ADL's: OT Grooming, Bathing, Dressing, Toileting     Advanced  ADL's: OT Simple Meal Preparation     Transfers: PT Bed to Chair, Bed Mobility, Car, Furniture, Floor  OT Toilet, Tub/Shower     Locomotion: PT Ambulation, Psychologist, prison and probation services, Stairs     Additional Impairments: OT Fuctional Use of Upper Extremity  SLP Swallowing, Social Cognition   Memory, Awareness, Problem Solving  TR      Anticipated Outcomes Item Anticipated Outcome  Self Feeding modified independent  Swallowing  Supervision with least restrictive diet    Basic self-care  modified independent  Toileting  modified independent   Bathroom Transfers modified independent to supervision level  Bowel/Bladder  Supervision  Transfers  Mod I with LRAD   Locomotion  Mod I Supervision assist with LRAD   Communication     Cognition  Supervision   Pain  <3 on a 0-10 pain scale  Safety/Judgment  Min assist, call for assistance   Therapy Plan: PT Intensity: Minimum of 1-2 x/day ,45 to 90 minutes PT Frequency: 5 out of 7 days PT Duration Estimated Length of Stay: 7-10  OT Intensity: Minimum of 1-2 x/day, 45 to 90 minutes OT Frequency: 5 out of 7 days OT Duration/Estimated Length of Stay: 8-10 days SLP Intensity: Minumum of 1-2 x/day, 30 to 90 minutes SLP Frequency: 3 to 5 out of 7 days SLP Duration/Estimated Length of Stay: 7-10 days        Team  Interventions: Nursing Interventions Patient/Family Education, Disease Management/Prevention, Medication Management, Pain Management, Discharge Planning, Psychosocial Support, Skin Care/Wound Management  PT interventions Ambulation/gait training, Warden/ranger, Cognitive remediation/compensation, Community reintegration, Discharge planning, Disease management/prevention, DME/adaptive equipment instruction, Functional electrical stimulation, Functional mobility training, Neuromuscular re-education, Patient/family education, Pain management, Psychosocial support, Splinting/orthotics, Stair training, Therapeutic Activities, Therapeutic Exercise, UE/LE Strength taining/ROM, UE/LE Coordination activities, Visual/perceptual remediation/compensation, Wheelchair propulsion/positioning  OT Interventions Warden/ranger, Community reintegration, Discharge planning, Self Care/advanced ADL retraining, Therapeutic Activities, UE/LE Coordination activities, Therapeutic Exercise, Patient/family education, Functional mobility training, DME/adaptive equipment instruction, Neuromuscular re-education, UE/LE Strength taining/ROM  SLP Interventions Cognitive remediation/compensation, Financial trader, Dysphagia/aspiration precaution training, Environmental controls, Internal/external aids, Patient/family education  TR Interventions    SW/CM Interventions Discharge Planning, Psychosocial Support, Patient/Family Education    Team Discharge Planning: Destination: PT-Home ,OT- Home , SLP-Home Projected Follow-up: PT-Home health PT, OT-  Home health OT, SLP-Home Health SLP, Outpatient SLP Projected Equipment Needs: PT-Rolling walker with 5" wheels, OT- To be determined, SLP-To be determined Equipment Details: PT- , OT-  Patient/family involved in discharge planning: PT- Patient, Family member/caregiver,  OT-Patient, Family member/caregiver, SLP-Patient, Family member/caregiver  MD ELOS:  8-13d Medical Rehab Prognosis:  Good Assessment: 76 y.o.right handed femalewith history of atrial fibrillation on no anticoagulation due to history of falls and SDH, diabetes mellitus. History taken from chart review and daughter, previously. Presented 10/15/2016 with left-sided weakness and facial droop.. Patient lives with daughterand son-in-law who work. She does have another daughter who checks on herbut  cannot provide 24-hour care. Reported to be independent with assistive device prior to admission. Two-level home with bedroom on first floor and 3 steps to entry. Cranial CT scan showed positive hyperdense right MCA bifurcation suspicious for emergent large vessel occlusion. No intracranial hemorrhage. CT cerebral perfusion scan positive for emergent large vessel occlusion distal right M1 or right MCA bifurcation. Cerebral angiogram and underwent complete revascularization of occluded right MCA M1 segment per interventional radiology. 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 Petechial blood products without frank hematoma. Mild swelling without shift. MRA of the head 10/16/2016 wide patency of the vessels with recannulization of the right MCA. Echocardiogram with ejection fraction of 70% no wall motion abnormalities. Carotid Dopplers With no ICA stenosis. Patient remained ventilatory support extubated 10/17/2016    Now requiring 24/7 Rehab RN,MD, as well as CIR level PT, OT and SLP.  Treatment team will focus on ADLs and mobility with goals set at See Team Conference Notes for weekly updates to the plan of care

## 2016-10-22 NOTE — Progress Notes (Signed)
Occupational Therapy Session Note  Patient Details  Name: Darlina RumpfHue Bawa MRN: [id number] Date of Birth: 11/02/1940  Today's Date: 10/22/2016 OT Individual Time: [id number] OT Individual Time Calculation (min): 75 min   Short Term Goals: Week 1:  OT Short Term Goal 1 (Week 1): LTGs equal to STGS set at overall supervision to modified independent level.   Skilled Therapeutic Intervehntions/Progress Updates:   ADL-retraining with focus on family ed (daughter present), functional mobility using RW, endurance, transfers, safety awareness (adherence to precautions and f/u with SLP instruction) and sequencing.   Pt received awake in bed with daughter present.  Pt requested assist to bathroom to toilet and setup for self-feeding as daughter left room prior to planned BADL.   Pt required 3 prompts to adhere to chin tuck while swallowing and coughed 3 times while consuming her grits, honey thick liquid, and yogurt.   Pt then ambulated to shower and bathed with intermittent steadying assist while she stood to wash periarea and butoocks..   Pt dressed at edge of shower bench with overall min assist to lace pants, don socks and don bra.   Pt then completed transfer to w/c and was escorted to ADL apartment for pt/family ed on tub bench transfers into tub/shower.   Pt completed transfer with steadying assist and returned to her room to groom using suction brush attachment for oral hygiene.  Therapy Documentation. Precautions:  Precautions Precautions: Fall Restrictions Weight Bearing Restrictions: No   Pain: No/denies pain     See Function Navigator for Current Functional Status.   Therapy/Group: Individual Therapy  Cassandr Cederberg 10/22/2016, 12:49 PM

## 2016-10-22 NOTE — Progress Notes (Signed)
Physical Therapy Session Note  Patient Details  Name: [name] MRN: [id number] Date of Birth: 1941/10/01  Today's Date: 10/22/2016 PT Individual Time: 1002-1100 PT Individual Time Calculation (min): 58 min    Short Term Goals: Week 1:  PT Short Term Goal 1 (Week 1): STG = LTG due to estimated length of stay   Skilled Therapeutic Interventions/Progress Updates:     Pt received sitting in WC and agreeable to PT. Daughter present throughout treatment  Gait through hall x 178f with standing break to talk to MD. Additional gait of 1459fand 609fPT provided close supervision assist -min assist throughout gait training with min cues for improved step height/length on the  LLE.   Blocked practice sit<>supine with min assist progressing to supervision assist. Cues for improved core activation, improved UE placement when coming to sitting and increased  Use of momentum to allow improved LE coordination into bed.   PT instructed [place] in [place]. Min cues for proper technique and UE support to improve stability as min assist in dynamic movement for safety.   Daughter educated in bathroom transfer without AD using min-supervision assist.   Patient returned too room and left sitting in recliner with call bell in reach and all needs met.       Therapy Documentation Precautions:  Precautions Precautions: Fall Restrictions Weight Bearing Restrictions: No    Pain:   0/10   See Function Navigator for Current Functional Status.   Therapy/Group: Individual Therapy  AusLorie Phenix6/2018, 11:06 AM

## 2016-10-22 NOTE — Progress Notes (Signed)
Speech Language Pathology Daily Session Note  Patient Details  Name: [name] MRN: [id number] Date of Birth: 10/11/1941  Today's Date: 10/22/2016 SLP Individual Time: 1405-1500 SLP Individual Time Calculation (min): 55 min   Short Term Goals: Week 1: SLP Short Term Goal 1 (Week 1): STG=LTG due to ELOS   Skilled Therapeutic Interventions: Pt was seen for skilled ST targeting cognitive and dysphagia goals.  SLP facilitated the session with a trial snack of dys 2 textures and honey thick liquids to continue working towards diet progression.  Pt demonstrated immediate cough x1 following a sneeze when consuming advanced solids.  It is unlikely that coughing episode is primarily related to PO intake versus secondary to timing of sneeze during the swallow as no other coughing was noted with solids or thickened liquids.  SLP discussed with pt and pt's daughter how to thicken liquids to the appropriate viscosity.  Daughter also returned demonstration of pureeing foods to the correct consistency by bringing foods from home which were adequately modified to pureed texture.   Pt was able to return demonstration of effortful swallow and chin tuck against resistance for 3x5 repetitions each with min assist verbal cues for recall of appropriate technique.  Pt required supervision verbal cues for route recall when ambulating back to her room.  Supervision verbal cues also needed for safety due to impulsivity with mobility.  Pt was left in room with daughter at bedside.    Function:  Eating Eating   Modified Consistency Diet: Yes Eating Assist Level: Supervision or verbal cues;Set up assist for   Eating Set Up Assist For: Opening containers       Cognition Comprehension Comprehension assist level: Follows basic conversation/direction with no assist  Expression   Expression assist level: Expresses basic needs/ideas: With no assist  Social Interaction Social Interaction assist level: Interacts appropriately  90% of the time - Needs monitoring or encouragement for participation or interaction.  Problem Solving Problem solving assist level: Solves basic 75 - 89% of the time/requires cueing 10 - 24% of the time  Memory Memory assist level: Recognizes or recalls 50 - 74% of the time/requires cueing 25 - 49% of the time    Pain Pain Assessment Pain Assessment: No/denies pain  Therapy/Group: Individual Therapy  Kasch Borquez, Melanee SpryNicole L 10/22/2016, 3:45 PM

## 2016-10-23 ENCOUNTER — Inpatient Hospital Stay (HOSPITAL_COMMUNITY): Payer: Medicare Other

## 2016-10-23 DIAGNOSIS — I482 Chronic atrial fibrillation: Secondary | ICD-10-CM

## 2016-10-23 MED ORDER — METOPROLOL TARTRATE 12.5 MG HALF TABLET: 12.5000 mg | ORAL_TABLET | Freq: Two times a day (BID) | ORAL | Status: DC

## 2016-10-23 MED ORDER — SODIUM CHLORIDE 0.45 % IV SOLN
INTRAVENOUS | Status: DC
  Administered 2016-10-23 – 2016-10-25 (×2): via INTRAVENOUS

## 2016-10-23 MED ORDER — METOPROLOL TARTRATE 12.5 MG HALF TABLET
12.5000 mg | ORAL_TABLET | Freq: Two times a day (BID) | ORAL | Status: DC
  Administered 2016-10-24 – 2016-11-01 (×14): 12.5 mg via ORAL
  Filled 2016-10-23 (×15): qty 1

## 2016-10-23 NOTE — Progress Notes (Signed)
Subjective/Complaints: Per daughter, patient feels fatigue. His up every 2 hours during the night having to urinate. Receives IV fluids. 7 PM to 7 AM Only drinks about 2 ounces every hour during the day.  Review systems: Limited by language, daughter, with the patient today. Patient denies any pains, no breathing problems, no bowel or bladder problems. Objective: Vital Signs: Blood pressure 130/82, pulse 93, temperature 98.2 F (36.8 C), temperature source Oral, resp. rate 16, height 4\' 10"  (1.473 m), weight 59.7 kg (131 lb 9.6 oz), SpO2 98 %. No results found. Results for orders placed or performed during the hospital encounter of 10/19/16 (from the past 72 hour(s))  Glucose, capillary     Status: None   Collection Time: 10/20/16 12:04 PM  Result Value Ref Range   Glucose-Capillary 75 65 - 99 mg/dL  Glucose, capillary     Status: Abnormal   Collection Time: 10/20/16  4:53 PM  Result Value Ref Range   Glucose-Capillary 109 (H) 65 - 99 mg/dL  Glucose, capillary     Status: Abnormal   Collection Time: 10/20/16  7:39 PM  Result Value Ref Range   Glucose-Capillary 131 (H) 65 - 99 mg/dL  Glucose, capillary     Status: Abnormal   Collection Time: 10/20/16 10:03 PM  Result Value Ref Range   Glucose-Capillary 153 (H) 65 - 99 mg/dL   Comment 1 Notify RN   Glucose, capillary     Status: Abnormal   Collection Time: 10/21/16  6:39 AM  Result Value Ref Range   Glucose-Capillary 127 (H) 65 - 99 mg/dL   Comment 1 Notify RN   Glucose, capillary     Status: None   Collection Time: 10/21/16 11:38 AM  Result Value Ref Range   Glucose-Capillary 86 65 - 99 mg/dL     HEENT: normal Cardio: RRR and No murmur or extra sounds Resp: CTA B/L and Unlabored GI: BS positive and Nontender, nondistended Extremity:  No Edema Skin:   Intact Neuro: Alert/Oriented, Abnormal Sensory Reports reduced LT sensation on Left hand compared to Right, Abnormal Motor 4- Left delt Bi Tri Grip, 4+ Left HF, KE, 3- L  ankle DF, Abnormal FMC Ataxic/ dec FMC and Tone:  Within Normal Limits Musc/Skel:  Other ful ROM in UEs, no pain with LE AROM Gen NAD   Assessment/Plan: 1. 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 Physiatrist is providing close team supervision and 24 hour management of active medical problems listed below. Physiatrist and rehab team continue to assess barriers to discharge/monitor patient progress toward functional and medical goals. FIM: Function - Bathing Bathing activity did not occur: N/A Position: Shower Body parts bathed by patient: Right arm, Left arm, Chest, Abdomen, Front perineal area, Right upper leg, Left upper leg, Right lower leg, Left lower leg Body parts bathed by helper: Back, Buttocks Assist Level: Touching or steadying assistance(Pt > 75%)  Function- Upper Body Dressing/Undressing Upper body dressing/undressing activity did not occur: N/A What is the patient wearing?: Bra, Pull over shirt/dress Bra - Perfomed by patient: Thread/unthread right bra strap, Thread/unthread left bra strap Bra - Perfomed by helper: Hook/unhook bra (pull down sports bra) Pull over shirt/dress - Perfomed by patient: Thread/unthread right sleeve, Thread/unthread left sleeve, Put head through opening, Pull shirt over trunk Pull over shirt/dress - Perfomed by helper: Thread/unthread right sleeve, Thread/unthread left sleeve, Put head through opening, Pull shirt  over trunk Button up shirt - Perfomed by patient: Thread/unthread right sleeve, Button/unbutton shirt Button up shirt - Perfomed by helper: Thread/unthread left sleeve, Pull shirt around back Assist Level: Touching or steadying assistance(Pt > 75%) Function - Lower Body Dressing/Undressing Lower body dressing/undressing activity did not occur: N/A What is the patient wearing?: Underwear, Pants,  Non-skid slipper socks, Ted Hose Position: Other (comment) (sitting on tub bench in shower) Underwear - Performed by patient: Thread/unthread right underwear leg, Thread/unthread left underwear leg, Pull underwear up/down Underwear - Performed by helper: Thread/unthread right underwear leg, Thread/unthread left underwear leg, Pull underwear up/down Pants- Performed by patient: Thread/unthread right pants leg, Thread/unthread left pants leg, Pull pants up/down Pants- Performed by helper: Thread/unthread right pants leg, Thread/unthread left pants leg, Pull pants up/down, Fasten/unfasten pants Non-skid slipper socks- Performed by patient: Don/doff right sock, Don/doff left sock Non-skid slipper socks- Performed by helper: Don/doff right sock, Don/doff left sock TED Hose - Performed by helper: Don/doff right TED hose, Don/doff left TED hose Assist for footwear: Setup Assist for lower body dressing:  (max assisit)  Function - Toileting Toileting steps completed by patient: Adjust clothing prior to toileting, Performs perineal hygiene, Adjust clothing after toileting Toileting Assistive Devices: Grab bar or rail Assist level: Supervision or verbal cues  Function - Archivist transfer assistive device: International aid/development worker, Walker Assist level to toilet: Touching or steadying assistance (Pt > 75%) Assist level from toilet: Touching or steadying assistance (Pt > 75%)  Function - Chair/bed transfer Chair/bed transfer activity did not occur: N/A Chair/bed transfer method: Ambulatory Chair/bed transfer assist level: Touching or steadying assistance (Pt > 75%) Chair/bed transfer assistive device: Armrests  Function - Locomotion: Wheelchair Will patient use wheelchair at discharge?: No Type: Manual Max wheelchair distance: 78ft  Assist Level: Touching or steadying assistance (Pt > 75%) Assist Level: Touching or steadying assistance (Pt > 75%) Wheel 150 feet activity did not occur:  Safety/medical concerns Turns around,maneuvers to table,bed, and toilet,negotiates 3% grade,maneuvers on rugs and over doorsills: No Function - Locomotion: Ambulation Assistive device: No device Max distance: 160 Assist level: Touching or steadying assistance (Pt > 75%) Assist level: Supervision or verbal cues Assist level: Touching or steadying assistance (Pt > 75%) Walk 150 feet activity did not occur: Refused Assist level: Touching or steadying assistance (Pt > 75%) Assist level: Touching or steadying assistance (Pt > 75%)  Function - Comprehension Comprehension: Auditory Comprehension assist level: Follows basic conversation/direction with no assist  Function - Expression Expression: Verbal Expression assist level: Expresses basic needs/ideas: With no assist  Function - Social Interaction Social Interaction assist level: Interacts appropriately 90% of the time - Needs monitoring or encouragement for participation or interaction.  Function - Problem Solving Problem solving assist level: Solves basic 75 - 89% of the time/requires cueing 10 - 24% of the time  Function - Memory Memory assist level: Recognizes or recalls 75 - 89% of the time/requires cueing 10 - 24% of the time Patient normally able to recall (first 3 days only): Current season, Location of own room, Staff names and faces, That he or she is in a hospital  Medical Problem List and Plan: 1.  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  DVT Prophylaxis/Anticoagulation: Subcutaneous Lovenox. Monitor for any bleeding episodes 3. Pain Management/headaches: Fioricet as needed 4. Dysphagia. Dysphagia #1 Honey thick  liquid diet. Monitor hydration. Follow-up speech therapy, 5. Neuropsych: This patient is capable of making decisions on  her own behalf. 6. Skin/Wound Care: Routine skin checks 7. Fluids/Electrolytes/Nutrition: Routine I&O with follow-up chemistries, intake 100% of 2 meals, 480 mL fluid on 10/22/2016 8. Diabetes mellitus peripheral neuropathy. Hemoglobin A1c 6.1.SSI. Check blood sugars before meals and at bedtime. Patient on Glucophage 500 mg twice a day prior to admission, no need to resume at the current time CBG (last 3)   Recent Labs  10/20/16 2203 10/21/16 0639 10/21/16 1138  GLUCAP 153* 127* 86    9. Atrial fibrillation. Patient on aspirin 81 mg daily prior to admission and monitor closely for history of falls with SDH. Cardiac rate controlled. Await possible plan to begin Eliquis 5 mg twice a day 14 days after stroke Vitals:   10/22/16 1300 10/23/16 0600  BP: 108/68 130/82  Pulse: 78 93  Resp: 18 16  Temp: 98.4 F (36.9 C) 98.2 F (36.8 C)  HR trending up as is BP will start lopressor 12.5mg  with parameters Per daughter, patient had prior history of small subdural hematoma after a fall in 2015. This was why she was taken off anticoagulation.We will need to discuss with neurology prior to reinitiation, fall risk is even higher now 10.  Hypoalb pro-stat, twice a day LOS (Days) 4 A FACE TO FACE EVALUATION WAS PERFORMED  KIRSTEINS,ANDREW E 10/23/2016, 10:07 AM

## 2016-10-23 NOTE — Progress Notes (Signed)
Occupational Therapy Session Note  Patient Details  Name: [name] MRN: [id number] Date of Birth: 10/06/1941  Today's Date: 10/23/2016 OT Individual Time:1000-1100   60 Minutes  Short Term Goals: Week 1:  OT Short Term Goal 1 (Week 1): LTGs equal to STGS set at overall supervision to modified independent level.   Skilled Therapeutic Interventions/Progress Updates:   ADL-retraining with focus on functional transfers, adapted bathing/dressing skills, and family education in prep for planned discharge.   Pt received supine in bed, dressed with daughter attending to her needs.   Pt and daughter were educated on plan for bathing/dressing in tub room to practice skills relating to use of tub bench in standard tub.   Pt completed bed mobility and transfer to w/c with contact guard (hand held) and vc for safety awareness.   Pt was escorted to tub room and re--educated on transfer using RW with daughter assisting throughout session.   No LOB noted and pt used tub bench and hand shower standing and sitting with mod assist to manage poor functioning equipment.   Daughter remained very interested on specific DME recommendations and was advised to seek f/u from primary therapist as well as contact with SW to determine pt's eligibility for DME provision prior to purchasing equipment.   OT provided home eval checklist and recommended use of personal digital camera (on phone) to reveal possible architectural barriers in home environment.   OT also provided a printed example of a personal call bell pendant that may be suitable for use at home as daughter reports her mother is not able to speak loudly enough to call for help at home when needed at daughter's request.  Pt returned to her room at end of session and recovered to bed for rest with daughter present to assist.      Therapy Documentation Precautions:  Precautions Precautions: Fall Restrictions Weight Bearing Restrictions: No   Vital Signs: Therapy  Vitals Temp: 98.2 F (36.8 C) Temp Source: Oral Pulse Rate: 93 Resp: 16 BP: 130/82 Patient Position (if appropriate): Sitting Oxygen Therapy SpO2: 98 % O2 Device: Not Delivered   Pain: None expressed     See Function Navigator for Current Functional Status.   Therapy/Group: Individual Therapy  Tephanie Escorcia 10/23/2016, 7:22 AM

## 2016-10-24 ENCOUNTER — Inpatient Hospital Stay (HOSPITAL_COMMUNITY): Payer: Medicare Other | Admitting: Speech Pathology

## 2016-10-24 ENCOUNTER — Inpatient Hospital Stay (HOSPITAL_COMMUNITY): Payer: Medicare Other | Admitting: Occupational Therapy

## 2016-10-24 ENCOUNTER — Inpatient Hospital Stay (HOSPITAL_COMMUNITY): Payer: Medicare Other

## 2016-10-24 LAB — GLUCOSE, CAPILLARY
GLUCOSE-CAPILLARY: 119 mg/dL — AB (ref 65–99)
GLUCOSE-CAPILLARY: 133 mg/dL — AB (ref 65–99)
GLUCOSE-CAPILLARY: 133 mg/dL — AB (ref 65–99)
GLUCOSE-CAPILLARY: 109 mg/dL — AB (ref 65–99)
GLUCOSE-CAPILLARY: 120 mg/dL — AB (ref 65–99)
GLUCOSE-CAPILLARY: 129 mg/dL — AB (ref 65–99)
Glucose-Capillary: 125 mg/dL — ABNORMAL HIGH (ref 65–99)
Glucose-Capillary: 118 mg/dL — ABNORMAL HIGH (ref 65–99)
Glucose-Capillary: 90 mg/dL (ref 65–99)
Glucose-Capillary: 127 mg/dL — ABNORMAL HIGH (ref 65–99)
Glucose-Capillary: 116 mg/dL — ABNORMAL HIGH (ref 65–99)
Glucose-Capillary: 123 mg/dL — ABNORMAL HIGH (ref 65–99)
Glucose-Capillary: 95 mg/dL (ref 65–99)

## 2016-10-24 NOTE — Progress Notes (Signed)
Speech Language Pathology Daily Session Note  Patient Details  Name: [name] MRN: [id number] Date of Birth: 06/19/1941  Today's Date: 10/24/2016 SLP Individual Time: 0900-1000 SLP Individual Time Calculation (min): 60 min   Short Term Goals: Week 1: SLP Short Term Goal 1 (Week 1): STG=LTG due to ELOS   Skilled Therapeutic Interventions:   Skilled treatment session focused on addressing dysphagia and cognition goals. SLP facilitated session by providing set-up of trials of ice chips and thin liquids via cup.  Patient consumed ice chip trials with multiple swallows and no overt s/s of aspiration.  Thin liquid cup sips resulted in intermittent subtle throat clear 3/4 trials, which SLP facilitated with Max assist verbal cues to make a hard effortful cough in an attempt to reduce suspected penetrates. For further trials the patient was educated on completing multiple swallows, hard cough, and another swallow consistently, which she required Min cues to carryover; however, cough remained weak overall.  Patient recalled and completed previously taught pahrangeal strengthening exercises 5 times each for 3 sets.  Plan for repeat objective swallow assessment tomorrow.   Function:  Eating Eating   Modified Consistency Diet: Yes Eating Assist Level: Supervision or verbal cues;Set up assist for   Eating Set Up Assist For: Opening containers       Cognition Comprehension Comprehension assist level: Follows basic conversation/direction with no assist  Expression   Expression assist level: Expresses basic needs/ideas: With no assist  Social Interaction Social Interaction assist level: Interacts appropriately 90% of the time - Needs monitoring or encouragement for participation or interaction.  Problem Solving Problem solving assist level: Solves basic 90% of the time/requires cueing < 10% of the time  Memory Memory assist level: Recognizes or recalls 90% of the time/requires cueing < 10% of the time     Pain Pain Assessment Pain Assessment: No/denies pain  Therapy/Group: Individual Therapy  Charlane FerrettiMelissa Glennys Schorsch, M.A., CCC-SLP [phone]  Chasyn Cinque 10/24/2016, 12:44 PM

## 2016-10-24 NOTE — Progress Notes (Signed)
Subjective/Complaints: Change fluids to 6 hour x 177ml/hr    Review systems: Limited by language, daughternot with the patient today. . Objective: Vital Signs: Blood pressure 129/75, pulse 98, temperature 98.5 F (36.9 C), temperature source Oral, resp. rate 18, height 4\' 10"  (1.473 m), weight 59.7 kg (131 lb 9.6 oz), SpO2 100 %. No results found. Results for orders placed or performed during the hospital encounter of 10/19/16 (from the past 72 hour(s))  Glucose, capillary     Status: None   Collection Time: 10/21/16 11:38 AM  Result Value Ref Range   Glucose-Capillary 86 65 - 99 mg/dL     HEENT: normal Cardio: RRR and No murmur or extra sounds Resp: CTA B/L and Unlabored GI: BS positive and Nontender, nondistended Extremity:  No Edema Skin:   Intact Neuro: Alert/Oriented, Abnormal Sensory Reports reduced LT sensation on Left hand compared to Right, Abnormal Motor 4- Left delt Bi Tri Grip, 4+ Left HF, KE, 3- L ankle DF, Abnormal FMC Ataxic/ dec FMC and Tone:  Within Normal Limits Musc/Skel:  Other ful ROM in UEs, no pain with LE AROM Gen NAD   Assessment/Plan: 1. 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 Physiatrist is providing close team supervision and 24 hour management of active medical problems listed below. Physiatrist and rehab team continue to assess barriers to discharge/monitor patient progress toward functional and medical goals. FIM: Function - Bathing Bathing activity did not occur: N/A Position: Shower Body parts bathed by patient: Right arm, Left arm, Chest, Abdomen, Front perineal area, Right upper leg, Left upper leg, Right lower leg, Left lower leg Body parts bathed by helper: Buttocks, Back Assist Level: Touching or steadying assistance(Pt > 75%)  Function- Upper Body Dressing/Undressing Upper body  dressing/undressing activity did not occur: N/A What is the patient wearing?: Bra, Pull over shirt/dress Bra - Perfomed by patient: Thread/unthread right bra strap, Thread/unthread left bra strap Bra - Perfomed by helper: Hook/unhook bra (pull down sports bra) Pull over shirt/dress - Perfomed by patient: Thread/unthread right sleeve, Thread/unthread left sleeve, Put head through opening, Pull shirt over trunk Pull over shirt/dress - Perfomed by helper: Thread/unthread right sleeve, Thread/unthread left sleeve, Put head through opening, Pull shirt over trunk Button up shirt - Perfomed by patient: Thread/unthread right sleeve, Button/unbutton shirt Button up shirt - Perfomed by helper: Thread/unthread left sleeve, Pull shirt around back Assist Level: Touching or steadying assistance(Pt > 75%) Function - Lower Body Dressing/Undressing Lower body dressing/undressing activity did not occur: N/A What is the patient wearing?: Underwear, Pants, Non-skid slipper socks, Ted Hose Position: Sitting EOB Underwear - Performed by patient: Thread/unthread right underwear leg, Thread/unthread left underwear leg, Pull underwear up/down Underwear - Performed by helper: Thread/unthread right underwear leg, Thread/unthread left underwear leg, Pull underwear up/down Pants- Performed by patient: Thread/unthread right pants leg, Thread/unthread left pants leg, Pull pants up/down Pants- Performed by helper: Thread/unthread right pants leg, Thread/unthread left pants leg, Pull pants up/down, Fasten/unfasten pants Non-skid slipper socks- Performed by patient: Don/doff right sock, Don/doff left sock Non-skid slipper socks- Performed by helper: Don/doff right sock, Don/doff left sock TED Hose - Performed by helper: Don/doff right TED hose, Don/doff left TED hose Assist for footwear: Setup Assist for lower body dressing: Touching or steadying assistance (Pt > 75%)  Function - Toileting Toileting steps completed by  patient: Adjust clothing prior to toileting, Performs perineal hygiene, Adjust clothing  after toileting Toileting Assistive Devices: Grab bar or rail Assist level: Supervision or verbal cues  Function - ArchivistToilet Transfers Toilet transfer assistive device: Grab bar, Walker Assist level to toilet: Touching or steadying assistance (Pt > 75%) Assist level from toilet: Touching or steadying assistance (Pt > 75%)  Function - Chair/bed transfer Chair/bed transfer activity did not occur: N/A Chair/bed transfer method: Stand pivot Chair/bed transfer assist level: Touching or steadying assistance (Pt > 75%) Chair/bed transfer assistive device: Armrests, Bedrails Chair/bed transfer details: Tactile cues for posture, Tactile cues for sequencing  Function - Locomotion: Wheelchair Will patient use wheelchair at discharge?: No Type: Manual Max wheelchair distance: 2850ft  Assist Level: Touching or steadying assistance (Pt > 75%) Assist Level: Touching or steadying assistance (Pt > 75%) Wheel 150 feet activity did not occur: Safety/medical concerns Turns around,maneuvers to table,bed, and toilet,negotiates 3% grade,maneuvers on rugs and over doorsills: No Function - Locomotion: Ambulation Assistive device: No device Max distance: 160 Assist level: Touching or steadying assistance (Pt > 75%) Assist level: Supervision or verbal cues Assist level: Touching or steadying assistance (Pt > 75%) Walk 150 feet activity did not occur: Refused Assist level: Touching or steadying assistance (Pt > 75%) Assist level: Touching or steadying assistance (Pt > 75%)  Function - Comprehension Comprehension: Auditory Comprehension assist level: Follows basic conversation/direction with no assist  Function - Expression Expression: Verbal Expression assist level: Expresses basic needs/ideas: With no assist  Function - Social Interaction Social Interaction assist level: Interacts appropriately 90% of the time - Needs  monitoring or encouragement for participation or interaction.  Function - Problem Solving Problem solving assist level: Solves basic 75 - 89% of the time/requires cueing 10 - 24% of the time  Function - Memory Memory assist level: Recognizes or recalls 75 - 89% of the time/requires cueing 10 - 24% of the time Patient normally able to recall (first 3 days only): Current season, Location of own room, Staff names and faces, That he or she is in a hospital  Medical Problem List and Plan: 1.  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  DVT Prophylaxis/Anticoagulation: Subcutaneous Lovenox. Monitor for any bleeding episodes 3. Pain Management/headaches: Fioricet as needed 4. Dysphagia. Dysphagia #1 Honey thick liquid diet. Monitor hydration. Follow-up speech therapy, 5. Neuropsych: This patient is capable of making decisions on her own behalf. 6. Skin/Wound Care: Routine skin checks 7. Fluids/Electrolytes/Nutrition: Routine I&O with follow-up chemistries, intake 100% of 2 meals, 480 mL fluid on 10/22/2016, gets additional 6hrs of IV fluid in evening, recheck BMET in am 8. Diabetes mellitus peripheral neuropathy. Hemoglobin A1c 6.1.SSI. Check blood sugars before meals and at bedtime. Patient on Glucophage 500 mg twice a day prior to admission, no need to resume at the current time CBG (last 3)   Recent Labs  10/21/16 1138  GLUCAP 86    9. Atrial fibrillation. Patient on aspirin 81 mg daily prior to admission and monitor closely for history of falls with SDH. Cardiac rate controlled. Await possible plan to begin Eliquis 5 mg twice a day 14 days after stroke Vitals:   10/23/16 1700 10/24/16 0406  BP: 119/72 129/75  Pulse: 73 98  Resp: 18 18  Temp: 97.7 F (36.5 C) 98.5 F (36.9 C)  HR trending up as is BP will start lopressor 12.5mg  with  parameters this am Per daughter, patient had prior history of small  subdural hematoma after a fall in 2015. This was why she was taken off anticoagulation.We will need to discuss with neurology prior to reinitiation, fall risk is even higher now, CVA on 12/30, possibly start DAOC 2 wks post  10.  Hypoalb pro-stat, twice a day LOS (Days) 5 A FACE TO FACE EVALUATION WAS PERFORMED  Keenan Dimitrov E 10/24/2016, 8:07 AM

## 2016-10-24 NOTE — Progress Notes (Signed)
Occupational Therapy Session Note  Patient Details  Name: [name] MRN: [id number] Date of Birth: 04/21/1941  Today's Date: 10/24/2016 OT Individual Time: 1100-1200  (Make Up session) OT Individual Time Calculation (min): 60 min     Short Term Goals: Week 1:  OT Short Term Goal 1 (Week 1): LTGs equal to STGS set at overall supervision to modified independent level.   Skilled Therapeutic Interventions/Progress Updates:    Pt seen for therapy make up time due to missed time over the weekend. Pt in supine upon arrival, no interpreter present for make up session. Pt able to nod head in agreement with therapy session. Pt impulsive with all mobility, requiring tactile and visual cues for safety awareness. She self propelled w/c to therapy gym with min A and tactile cuing for initiation of L UE to assist with propulsion, however, was not able to carry through with use. Pt completed simple jig saw puzzle from table top level. She stood ~15 minutes to complete and then sat for remainder of task. Min cuing for initiation of L UE into functional task. Required mod- total A for problem solving of puzzle pieces with pt inconsistently able to problem solve turning of pieces for correct fitting. She also displayed perceptal difficulty, trying to piece together two corner pieces, etc. Pt ambulated back to room while pushing w/c at end of session. She then completed toileting task with steadying assist. Pt left seated in w/c at end of session, QRB on, all needs in reach and NT present.   Therapy Documentation Precautions:  Precautions Precautions: Fall Restrictions Weight Bearing Restrictions: No  See Function Navigator for Current Functional Status.   Therapy/Group: Individual Therapy  [name], Lauren Mantell C 10/24/2016, 12:08 PM

## 2016-10-24 NOTE — Progress Notes (Addendum)
Physical Therapy Session Note  Patient Details  Name: [name] MRN: [id number] Date of Birth: 02/27/1941  Today's Date: 10/24/2016 PT Individual Time: 1300-1415 PT Individual Time Calculation (min): 75 min   Short Term Goals: Week 1:  PT Short Term Goal 1 (Week 1): STG = LTG due to estimated length of stay   Skilled Therapeutic Interventions/Progress Updates: wearing black plastic sandals and non slip socks, gait without AD, hand holding R hand with PT x 150' with narow BOs, L hip ER , mild LOB L and forward as she increased gait , festinating.  neuromuscular re-education via multimodal cues, demo, with dtr translating for L step taps, bil hip ER stretch with Theraband around thighs, reaching activity to facilitate trunk shortening/lengthening while reaching L/R slightly out of BOS. After  Neuro re-ed, git without AD with close supervision on level tile. Blocked practice sit>< stand using Bobath method and sitting on wedge to decrease posterior pelvic tilt focusing on eccentric control. Standing with bil UE support for 5 x 2 R hip abduction, bil hips in neutral rotation.  Discussed shoes with dtr [name] regarding heel support.  Gait to return to room, pushing grocery cart.  Supine bil bridging x 10. Toilet transfer for continent voiding.  Pt exhausted, and transferred to bed.  [facility] needed +2 [facility] using rails.  Pt left resting in bed with alarm set and all needs within reach. Dtr leaving soon.     Therapy Documentation Precautions:  Precautions Precautions: Fall Restrictions Weight Bearing Restrictions: No   Pain: pt indicated area around T12; unable to rate.    Other Treatments: Treatments Neuromuscular Facilitation: Left;Upper Extremity;Lower Extremity;Activity to increase motor control;Forced use;Activity to increase coordination;Activity to increase timing and sequencing;Activity to increase grading;Activity to increase lateral weight shifting;Activity to increase  anterior-posterior weight shifting;Limitations Neuromuscular Facilitation Limitations: scoliosis, T12 fx   See Function Navigator for Current Functional Status.   Therapy/Group: Individual Therapy  [name] 10/24/2016, 2:14 PM

## 2016-10-24 NOTE — Progress Notes (Addendum)
Occupational Therapy Session Note  Patient Details  Name: Lauren RumpfHue [name] MRN: [id number] Date of Birth: 09/03/1941  Today's Date: 10/24/2016 OT Individual Time: [id number] OT Individual Time Calculation (min): 59 min    Short Term Goals: Week 1:  OT Short Term Goal 1 (Week 1): LTGs equal to STGS set at overall supervision to modified independent level.      Skilled Therapeutic Interventions/Progress Updates:   Skilled OT session completed with focus on dynamic standing, L UE NMR, and safety awareness. Pt was eating breakfast at time of arrival, integrating bilateral UEs with min cues. Afterwards pt ambulated to sink with steady assist to complete oral care. Cues for not drinking water from cup due to precautions. At this time, daughter arrived for interpretation. Pt agreeable to shower, ambulated to shower bench with Min A. IV covered. Bathing completed with overall Min A standing as needed with grab bars. Dressing completed while seated on tub bench per pts preference with overall supervision. Daughter reported that she wanted to apply lotion to pts legs later prior to donning Teds due to pt c/o itchiness when wearing. Therefore Teds were not donned during session.Toilet transfer/toileting completed x2 instances during tx due to pt anticipating BM but being unsuccessful aside from urinating. Pt was then transferred to bedside recliner and left with daughter at time of departure.   Therapy Documentation Precautions:  Precautions Precautions: Fall Restrictions Weight Bearing Restrictions: No   Pain: No c/o pain during session  Pain Assessment Pain Assessment: No/denies pain ADL:      See Function Navigator for Current Functional Status.   Therapy/Group: Individual Therapy  [name] 10/24/2016, 12:48 PM

## 2016-10-25 ENCOUNTER — Inpatient Hospital Stay (HOSPITAL_COMMUNITY): Payer: Medicare Other | Admitting: Occupational Therapy

## 2016-10-25 ENCOUNTER — Inpatient Hospital Stay (HOSPITAL_COMMUNITY): Payer: Medicare Other

## 2016-10-25 ENCOUNTER — Inpatient Hospital Stay (HOSPITAL_COMMUNITY): Payer: Medicare Other | Admitting: Speech Pathology

## 2016-10-25 DIAGNOSIS — R7989 Other specified abnormal findings of blood chemistry: Secondary | ICD-10-CM

## 2016-10-25 LAB — BASIC METABOLIC PANEL
ANION GAP: 10 (ref 5–15)
BUN: 12 mg/dL (ref 6–20)
CO2: 27 mmol/L (ref 22–32)
Calcium: 9.3 mg/dL (ref 8.9–10.3)
Chloride: 104 mmol/L (ref 101–111)
Creatinine, Ser: 0.57 mg/dL (ref 0.44–1.00)
Glucose, Bld: 111 mg/dL — ABNORMAL HIGH (ref 65–99)
POTASSIUM: 3.8 mmol/L (ref 3.5–5.1)
SODIUM: 141 mmol/L (ref 135–145)

## 2016-10-25 LAB — GLUCOSE, CAPILLARY
GLUCOSE-CAPILLARY: 163 mg/dL — AB (ref 65–99)
GLUCOSE-CAPILLARY: 113 mg/dL — AB (ref 65–99)
Glucose-Capillary: 103 mg/dL — ABNORMAL HIGH (ref 65–99)
Glucose-Capillary: 129 mg/dL — ABNORMAL HIGH (ref 65–99)

## 2016-10-25 MED ORDER — SALINE SPRAY 0.65 % NA SOLN
1.0000 | NASAL | Status: DC | PRN
  Administered 2016-10-25: 1 via NASAL
  Filled 2016-10-25: qty 44

## 2016-10-25 NOTE — Progress Notes (Signed)
Speech Language Pathology Note  Patient Details  Name: [name] MRN: [id number] Date of Birth: 08/28/1941 Today's Date: 10/25/2016  MBSS complete. Full report located under chart review in imaging section.    Mikael Debell, Melanee SpryNicole L 10/25/2016, 12:17 PM

## 2016-10-25 NOTE — Progress Notes (Signed)
Physical Therapy Session Note  Patient Details  Name: [name] MRN: [id number] Date of Birth: 11/19/1940  Today's Date: 10/25/2016 PT Individual Time: [id number] PT Individual Time Calculation (min): 30 min    Short Term Goals: Week 1:  PT Short Term Goal 1 (Week 1): STG = LTG due to estimated length of stay   Skilled Therapeutic Interventions/Progress Updates:    Pt finishing breakfast with supervision for safety sitting EOB. Focused on functional mobility and transfers in the room for toileting needs at close supervision to steadying assist level. Gait to/from therapy gym with focus on addressing L foot clearance and balance with close supervision to steadying assist. During the return walk, had patient hold a tray balancing a cup to increase challenge - pt able to complete successfully. Neuro re-ed to address coordination, balance, and increasing weightbearing to LLE during toe tap activity. Attempted with cone initially but pt unable to lift leg up high enough so changed to 2' block with focus on increasing control (10 reps with R and L with min assist) and progressing to alternating toe taps with visual target for L foot placement.   Medical interpreter present during session to translate.    Therapy Documentation Precautions:  Precautions Precautions: Fall Restrictions Weight Bearing Restrictions: No   Pain:  No complaints of pain during session. Reported ear pain to MD this AM - he addressed.   See Function Navigator for Current Functional Status.   Therapy/Group: Individual Therapy   [name], [name]  Lucus Lambertson B. Nakeya Adinolfi, PT, DPT  10/25/2016, 8:38 AM

## 2016-10-25 NOTE — Progress Notes (Signed)
Subjective/Complaints: Right ear pain with swallowing. No discharge. No throat pain PT this morning Notes numbness left foot when walking    Review systems: Interpreter here today. Feels weak on left side, but no different than prior, right ear pain, no problem with sleep Objective: Vital Signs: Blood pressure 130/79, pulse 87, temperature 98.2 F (36.8 C), temperature source Oral, resp. rate 17, height 4\' 10"  (1.473 m), weight 59.7 kg (131 lb 9.6 oz), SpO2 97 %. No results found. Results for orders placed or performed during the hospital encounter of 10/19/16 (from the past 72 hour(s))  Glucose, capillary     Status: None   Collection Time: 10/22/16 11:32 AM  Result Value Ref Range   Glucose-Capillary 90 65 - 99 mg/dL  Glucose, capillary     Status: Abnormal   Collection Time: 10/22/16  4:21 PM  Result Value Ref Range   Glucose-Capillary 127 (H) 65 - 99 mg/dL  Glucose, capillary     Status: Abnormal   Collection Time: 10/22/16  9:15 PM  Result Value Ref Range   Glucose-Capillary 133 (H) 65 - 99 mg/dL  Glucose, capillary     Status: Abnormal   Collection Time: 10/23/16  6:36 AM  Result Value Ref Range   Glucose-Capillary 116 (H) 65 - 99 mg/dL  Glucose, capillary     Status: Abnormal   Collection Time: 10/23/16 11:50 AM  Result Value Ref Range   Glucose-Capillary 109 (H) 65 - 99 mg/dL  Glucose, capillary     Status: Abnormal   Collection Time: 10/23/16  5:20 PM  Result Value Ref Range   Glucose-Capillary 120 (H) 65 - 99 mg/dL  Glucose, capillary     Status: Abnormal   Collection Time: 10/23/16  9:29 PM  Result Value Ref Range   Glucose-Capillary 123 (H) 65 - 99 mg/dL  Glucose, capillary     Status: Abnormal   Collection Time: 10/24/16  6:55 AM  Result Value Ref Range   Glucose-Capillary 129 (H) 65 - 99 mg/dL  Glucose, capillary     Status: None   Collection Time: 10/24/16 12:02 PM  Result Value Ref Range   Glucose-Capillary 95 65 - 99 mg/dL     HEENT: normal,  Right TM is clear, right external canal is clear Cardio: RRR and No murmur or extra sounds Resp: CTA B/L and Unlabored GI: BS positive and Nontender, nondistended Extremity:  No Edema Skin:   Intact Neuro: Alert/Oriented, Abnormal Sensory Reports reduced LT sensation on Left hand compared to Right, Abnormal Motor 4- Left delt Bi Tri Grip, 4+ Left HF, KE, 3- L ankle DF, Abnormal FMC Ataxic/ dec FMC and Tone:  Within Normal Limits Musc/Skel:  Other ful ROM in UEs, no pain with LE AROM Gen NAD   Assessment/Plan: 1. 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 Physiatrist is providing close team supervision and 24 hour management of active medical problems listed below. Physiatrist and rehab team continue to assess barriers to discharge/monitor patient progress toward functional and medical goals. FIM: Function - Bathing Position: Shower Body parts bathed by patient: Right arm, Left arm, Chest, Abdomen, Front perineal area, Right upper leg, Left upper leg, Right lower leg, Left lower leg Body parts bathed by helper: Buttocks, Back Assist Level: Touching or steadying assistance(Pt > 75%)  Function- Upper Body Dressing/Undressing What is the patient wearing?: Pull over shirt/dress Bra - Perfomed by  patient: Thread/unthread right bra strap, Thread/unthread left bra strap Bra - Perfomed by helper: Hook/unhook bra (pull down sports bra) Pull over shirt/dress - Perfomed by patient: Thread/unthread right sleeve, Thread/unthread left sleeve, Put head through opening, Pull shirt over trunk Pull over shirt/dress - Perfomed by helper: Thread/unthread right sleeve, Thread/unthread left sleeve, Put head through opening, Pull shirt over trunk Button up shirt - Perfomed by patient: Thread/unthread right sleeve, Button/unbutton shirt Button up shirt - Perfomed by helper:  Thread/unthread left sleeve, Pull shirt around back Assist Level: Supervision or verbal cues Function - Lower Body Dressing/Undressing What is the patient wearing?: Underwear, Non-skid slipper socks, Pants Position: Other (comment) (sitting on tub bench) Underwear - Performed by patient: Thread/unthread right underwear leg, Thread/unthread left underwear leg, Pull underwear up/down Underwear - Performed by helper: Thread/unthread right underwear leg, Thread/unthread left underwear leg, Pull underwear up/down Pants- Performed by patient: Thread/unthread right pants leg, Thread/unthread left pants leg, Pull pants up/down Pants- Performed by helper: Thread/unthread right pants leg, Thread/unthread left pants leg, Pull pants up/down, Fasten/unfasten pants Non-skid slipper socks- Performed by patient: Don/doff right sock, Don/doff left sock Non-skid slipper socks- Performed by helper: Don/doff right sock, Don/doff left sock TED Hose - Performed by helper: Don/doff right TED hose, Don/doff left TED hose Assist for footwear: Setup Assist for lower body dressing: Supervision or verbal cues  Function - Toileting Toileting steps completed by patient: Adjust clothing prior to toileting, Performs perineal hygiene, Adjust clothing after toileting Toileting Assistive Devices: Grab bar or rail Assist level: Supervision or verbal cues  Function Programmer, multimedia transfer assistive device: Grab bar Assist level to toilet: Touching or steadying assistance (Pt > 75%) Assist level from toilet: Touching or steadying assistance (Pt > 75%) Assist level to bedside commode (at bedside): Touching or steadying assistance (Pt > 75%) (per Loel Ro, NT) Assist level from bedside commode (at bedside): Touching or steadying assistance (Pt > 75%)  Function - Chair/bed transfer Chair/bed transfer method: Stand pivot Chair/bed transfer assist level: Touching or steadying assistance (Pt > 75%) Chair/bed  transfer assistive device: Armrests, Bedrails Chair/bed transfer details: Verbal cues for safe use of DME/AE, Verbal cues for technique  Function - Locomotion: Wheelchair Will patient use wheelchair at discharge?: No Type: Manual Max wheelchair distance: 37ft  Assist Level: Touching or steadying assistance (Pt > 75%) Assist Level: Touching or steadying assistance (Pt > 75%) Wheel 150 feet activity did not occur: Safety/medical concerns Turns around,maneuvers to table,bed, and toilet,negotiates 3% grade,maneuvers on rugs and over doorsills: No Function - Locomotion: Ambulation Assistive device: No device Max distance: 90 Assist level: Supervision or verbal cues Assist level: Supervision or verbal cues Assist level: Touching or steadying assistance (Pt > 75%) Walk 150 feet activity did not occur: Refused Assist level: Touching or steadying assistance (Pt > 75%) Assist level: Touching or steadying assistance (Pt > 75%)  Function - Comprehension Comprehension: Auditory Comprehension assist level: Follows basic conversation/direction with no assist  Function - Expression Expression: Verbal Expression assist level: Expresses basic needs/ideas: With no assist  Function - Social Interaction Social Interaction assist level: Interacts appropriately 90% of the time - Needs monitoring or encouragement for participation or interaction.  Function - Problem Solving Problem solving assist level: Solves basic 75 - 89% of the time/requires cueing 10 - 24% of the time  Function - Memory Memory assist level: Recognizes or recalls 25 - 49% of the time/requires cueing 50 - 75% of the time Patient normally able to recall (first 3 days  only): Current season, Location of own room, Staff names and faces, That he or she is in a hospital  Medical Problem List and Plan: 1.  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  DVT Prophylaxis/Anticoagulation: Subcutaneous Lovenox. Monitor for any bleeding episodes 3. Pain Management/headaches: Fioricet as needed 4. Dysphagia. Dysphagia #1 Honey thick liquid diet. Monitor hydration. Follow-up speech therapy, 5. Neuropsych: This patient is capable of making decisions on her own behalf. 6. Skin/Wound Care: Routine skin checks 7. Fluids/Electrolytes/Nutrition: Routine I&O with follow-up chemistries, intake 100% of 2 meals, 480 mL fluid on 10/22/2016, gets additional 6hrs of IV fluid in evening, recheck BMET today 8. Diabetes mellitus peripheral neuropathy. Hemoglobin A1c 6.1.SSI. Check blood sugars before meals and at bedtime. Patient on Glucophage 500 mg twice a day prior to admission, no need to resume at the current time CBG (last 3)   Recent Labs  10/23/16 2129 10/24/16 0655 10/24/16 1202  GLUCAP 123* 129* 95    9. Atrial fibrillation. Patient on aspirin 81 mg daily prior to admission and monitor closely for history of falls with SDH. Cardiac rate controlled. Await possible plan to begin Eliquis 5 mg twice a day 14 days after stroke Vitals:   10/24/16 2204 10/25/16 0345  BP: 122/69 130/79  Pulse: 88 87  Resp:  17  Temp:  98.2 F (36.8 C)  HR trending up as is BP will start lopressor 12.5mg  with parameters this am Per daughter, patient had prior history of small subdural hematoma after a fall in 2015. This was why she was taken off anticoagulation.We will need to discuss with neurology prior to reinitiation, fall risk is even higher now, CVA on 12/30, possibly start DAOC 2 wks post  10.  Hypoalb pro-stat, twice a day 11. Right ear pain . TM is clear, external canal is normal, likely pressure change during swallowing may have some congestion, nasal spray, normal saline LOS (Days) 6 A FACE TO FACE EVALUATION WAS PERFORMED  Tymon Nemetz E 10/25/2016, 8:33 AM

## 2016-10-25 NOTE — Progress Notes (Signed)
Speech Language Pathology Daily Session Note  Patient Details  Name: [name] MRN: [id number] Date of Birth: 12/24/1940  Today's Date: 10/25/2016 SLP Individual Time: 1415-1445 SLP Individual Time Calculation (min): 30 min   Short Term Goals: Week 1: SLP Short Term Goal 1 (Week 1): STG=LTG due to ELOS   Skilled Therapeutic Interventions: Pt was seen for skilled ST targeting dysphagia goals.  SLP facilitated the session with trials of water following oral care per the water protocol.  Pt demonstrated x2 delayed coughing episodes and intermittent belching with cup sips of thin liquids.  Pt reported that cold water was causing increased coughing which could be related to ?baseline esophageal dysfunction.  Pt reports good toleration of lunch meal with advanced solids and liquids.  Pt was able to return 5x5 repetitions of the chin tuck against resistance and effortful swallow each with mod I.  Pt was left in bed with sister at bedside and bed alarm set.  Continue per current plan of care.     Function:  Eating Eating   Modified Consistency Diet: Yes Eating Assist Level: Set up assist for;Supervision or verbal cues   Eating Set Up Assist For: Opening containers       Cognition Comprehension Comprehension assist level: Follows basic conversation/direction with no assist  Expression   Expression assist level: Expresses basic needs/ideas: With no assist  Social Interaction Social Interaction assist level: Interacts appropriately 90% of the time - Needs monitoring or encouragement for participation or interaction.  Problem Solving Problem solving assist level: Solves basic 75 - 89% of the time/requires cueing 10 - 24% of the time  Memory Memory assist level: Recognizes or recalls 90% of the time/requires cueing < 10% of the time    Pain Pain Assessment Pain Assessment: No/denies pain  Therapy/Group: Individual Therapy  Clarissia Mckeen, Melanee SpryNicole L 10/25/2016, 3:02 PM

## 2016-10-25 NOTE — Progress Notes (Signed)
Physical Therapy Session Note  Patient Details  Name: [name] MRN: [id number] Date of Birth: 06/04/1941  Today's Date: 10/25/2016 PT Individual Time: 1445-1530 PT Individual Time Calculation (min): 45 min    Short Term Goals: Week 1:  PT Short Term Goal 1 (Week 1): STG = LTG due to estimated length of stay   Skilled Therapeutic Interventions/Progress Updates:    Session focused on gait training with focus on L foot clearance, stair negotiation to prepare for home entry to third floor (only able to complete 2 simulated flights today due to endurance) with supervision to min assist and cues for L foot placement and attention, overall transfers, and neuro re-ed on Biodex to address weightshifting and balance re-training on compliant and non-compliant surface (weigthshifting activity with visual targets (43% x 5 min) and random control activity (Trial 1 noncompliant: 53%, Trial 2 noncompliant: 54%; Trial 3 compliant (level 8): 30% all for 2 min intervals each). Pt with difficulty with weightshifting during this activity requiring facilitation by therapist.   Medical interpreter present during session.   Therapy Documentation Precautions:  Precautions Precautions: Fall Restrictions Weight Bearing Restrictions: No  Pain: Pain Assessment Pain Assessment: No/denies pain   See Function Navigator for Current Functional Status.   Therapy/Group: Individual Therapy  Karolee StampsGray, Kahliyah Dick Darrol PokeBrescia  Anthony Tamburo B. Lennyn Bellanca, PT, DPT  10/25/2016, 3:45 PM

## 2016-10-25 NOTE — Progress Notes (Signed)
Occupational Therapy Session Note  Patient Details  Name: [name] MRN: [id number] Date of Birth: 10/15/1941  Today's Date: 10/25/2016 OT Individual Time: 1300-1414 OT Individual Time Calculation (min): 74 min    Short Term Goals: Week 1:  OT Short Term Goal 1 (Week 1): LTGs equal to STGS set at overall supervision to modified independent level.   Skilled Therapeutic Interventions/Progress Updates:    Pt completed transfer from supine to sit EOB with supervision during session.  She was able to transfer to the wheelchair with min guard assist, without use of any assistive device.  She worked on Research officer, political partypropelling her wheelchair using BUEs to the [employer] gym with mod assist secondary to decreased coordination in the LUE.  Once in the gym  Therapist had her standing for several intervals using the [employer] to work on LUE functional use.  She was able to complete one interval with the LUE in less than 3 seconds, comparatively on the right it was closer to 2 seconds.  She was able to complete several intervals with time limits on the LUE.  When it was adjusted to 2.5 seconds between each light she completed 18/28,17/28, and 14/28.  When time was reduced to 2 seconds she was only able to get to 11/31 lights.  Progressed to therapy putty exercises with the LUE at table top.  Educated pt through interpreter with use of her putty on FM coordination and LUE hand strengthening exercises for the LUE.  Provided handout for pt's daughter to reference when she comes in.  She was able to return demonstrate exercises with mod instructional cueing.  Returned to room via wheelchair with safety belt in place and family and translator present in the room.    Therapy Documentation Precautions:  Precautions Precautions: Fall Restrictions Weight Bearing Restrictions: No  Pain: Pain Assessment Pain Assessment: No/denies pain ADL:  See Function Navigator for Current Functional Status.   Therapy/Group: Individual  Therapy  Jamy Cleckler OTR/L 10/25/2016, 3:46 PM

## 2016-10-26 ENCOUNTER — Inpatient Hospital Stay (HOSPITAL_COMMUNITY): Payer: Medicare Other | Admitting: Occupational Therapy

## 2016-10-26 ENCOUNTER — Inpatient Hospital Stay (HOSPITAL_COMMUNITY): Payer: Medicare Other | Admitting: Physical Therapy

## 2016-10-26 ENCOUNTER — Inpatient Hospital Stay (HOSPITAL_COMMUNITY): Payer: Medicare Other

## 2016-10-26 ENCOUNTER — Inpatient Hospital Stay (HOSPITAL_COMMUNITY): Payer: Medicare Other | Admitting: Speech Pathology

## 2016-10-26 LAB — URINALYSIS, ROUTINE W REFLEX MICROSCOPIC
Bacteria, UA: NONE SEEN
Bilirubin Urine: NEGATIVE
Glucose, UA: NEGATIVE mg/dL
Hgb urine dipstick: NEGATIVE
Ketones, ur: NEGATIVE mg/dL
Nitrite: NEGATIVE
PH: 7 (ref 5.0–8.0)
Protein, ur: NEGATIVE mg/dL
SPECIFIC GRAVITY, URINE: 1.013 (ref 1.005–1.030)

## 2016-10-26 LAB — GLUCOSE, CAPILLARY
GLUCOSE-CAPILLARY: 110 mg/dL — AB (ref 65–99)
GLUCOSE-CAPILLARY: 105 mg/dL — AB (ref 65–99)
Glucose-Capillary: 93 mg/dL (ref 65–99)

## 2016-10-26 LAB — CREATININE, SERUM: CREATININE: 0.66 mg/dL (ref 0.44–1.00)

## 2016-10-26 NOTE — Progress Notes (Signed)
Social Work Patient ID: [name], female   DOB: 1941-06-01, 76 y.o.   MRN: [id number]  Met with daughter and pt to discuss team conference goals supervision level and target discharge 1/16. Daughter works on Tuesday and is giving  A presentation so will be here later in the afternoon. Daughter will try to change her Wed schedule so she can be there with pt at home to provide supervision level. Daughter understand pt's need for supervision level at  Home for safety. Discussed equipment and follow up therapies, both are agreeable to this. Daughter reports they have a walker she will dig out and bring in to see if right size for pt. She is also interested in getting a tub bench Due to tub at home. Daughter continues to be here when not working and providing assist and interpreting for her.

## 2016-10-26 NOTE — Progress Notes (Signed)
Occupational Therapy Session Note  Patient Details  Name: [name] MRN: [id number] Date of Birth: 01/22/1941  Today's Date: 10/26/2016 OT Individual Time: [id number] OT Individual Time Calculation (min): 73 min     Short Term Goals: Week 1:  OT Short Term Goal 1 (Week 1): LTGs equal to STGS set at overall supervision to modified independent level.   Skilled Therapeutic Interventions/Progress Updates:    Pt completed bathing and dressing sit to stand with daughter present for translation.  She was able to ambulate to and from the shower with min guard assist.  Noted LOB X 2 in shower when standing to wash peri area.  Pt tends to also want to stand most of the time except when washing her LEs.  Explained to pt and daughter that it is much safer to sit and complete these tasks, and to only stand for washing peri area.  Completed dressing sit to stand on the edge of the shower seat as well.  Therapist assisted with donning TEDs after daughter applied lotion to pt's legs.  Finished session with education on theraputty exercises with pt and daughter.  Pt was able to return demonstrate tip to tip pinch with digits 2 and 3 as well as gross digit flexion.  She needed max demonstrational cueing to complete intrinsic finger flexion with PIPs in extension as well as lateral pinch.  Pt left in wheelchair at end of session with family present.   Therapy Documentation Precautions:  Precautions Precautions: Fall Restrictions Weight Bearing Restrictions: No  Pain: Pain Assessment Pain Assessment: No/denies pain ADL: See Function Navigator for Current Functional Status.   Therapy/Group: Individual Therapy  Cassius Cullinane OTR/L 10/26/2016, 11:01 AM

## 2016-10-26 NOTE — Progress Notes (Signed)
Speech Language Pathology Daily Session Note  Patient Details  Name: [name] MRN: [id number] Date of Birth: 09/01/1941  Today's Date: 10/26/2016 SLP Individual Time: 0930-1000 SLP Individual Time Calculation (min): 30 min   Short Term Goals:Week 1: SLP Short Term Goal 1 (Week 1): STG=LTG due to ELOS   Skilled Therapeutic Interventions: Pt was seen for skilled ST targeting goals for dysphagia.  Pt's daughter and multiple family members were present and were educated regarding results of MBS and rationale for currently prescribed diet.  SLP also discussed parameters of the water protocol.  Pt's daughter verbalized understanding of procedures of water protocol and all questions were answered to her satisfaction at this time.  Pt demonstrated no overt s/s of aspiration with cup sips of regular water but exhibited mildly congested vocal quality following intake of solids which SLP suspects to be related to delay in swallow initiation.  No overt s/s of aspiration were evident with nectar thick liquids.  Pt was left in wheelchair with family at bedside.  Continue per current plan of care.       Function:  Eating Eating   Modified Consistency Diet: Yes Eating Assist Level: Set up assist for;Supervision or verbal cues   Eating Set Up Assist For: Opening containers       Cognition Comprehension Comprehension assist level: Follows basic conversation/direction with no assist  Expression   Expression assist level: Expresses basic needs/ideas: With no assist  Social Interaction Social Interaction assist level: Interacts appropriately 90% of the time - Needs monitoring or encouragement for participation or interaction.  Problem Solving Problem solving assist level: Solves basic 90% of the time/requires cueing < 10% of the time  Memory Memory assist level: Recognizes or recalls 75 - 89% of the time/requires cueing 10 - 24% of the time    Pain Pain Assessment Pain Assessment: No/denies  pain  Therapy/Group: Individual Therapy  Jasmin Trumbull, Melanee SpryNicole L 10/26/2016, 2:22 PM

## 2016-10-26 NOTE — Progress Notes (Signed)
Physical Therapy Note  Patient Details  Name: [name] MRN: [id number] Date of Birth: 03/18/1941 Today's Date: 10/26/2016  1330-1415, 45 min individual individual tx Pain:  8/10 HA, declined meds.. Dtr stated pt has had HA often since SDH in 2105.    Family ed with dtr Roben for gait on level tile, use of RW, . toilet transfer.  Pt is cleared for taking pt to toilet.  Gait on level tile with close supervision using Youth RW, which fits pt better.  Advanced gait training stepping over 5 x 2 canes and rods withRW; pt bumped 3/10 with L foot.  She was aware but unconcerned.  dtr stated that family has a new puppy, and there are 4 kids.  Kids keep most of their toys upstiars, but puppy wtill be on main level where pt will stay. neuromuscular re-education via demo and VCs for seated R/L hip flexion x 25 cycles. Pt requested getting back in bed at end of session.  Pt left resting in bed with all needs within reach, with bed alarm set.  Dtr Yadhira instructed in use of bed alarm; she planned to stay in room iwht pt.     See function navigator for current status.  Jaythan Hinely 10/26/2016, 2:03 PM

## 2016-10-26 NOTE — Progress Notes (Signed)
Social Work Lauren Chris, LCSW Social Worker Signed   Patient Care Conference Date of Service: 10/26/2016 12:56 PM      Hide copied text Hover for attribution information Inpatient RehabilitationTeam Conference and Plan of Care Update Date: 10/26/2016   Time: 10:30 AM      Patient Name: [name]      Medical Record Number: [id number]  Date of Birth: 12/06/1940 Sex: Female         Room/Bed: 4W04C/4W04C-01 Payor Info: Payor: MEDICARE / Plan: MEDICARE PART A AND B / Product Type: *No Product type* /     Admitting Diagnosis: R CVA  Admit Date/Time:  10/19/2016  4:53 PM Admission Comments: No comment available    Primary Diagnosis:  <principal problem not specified> Principal Problem: <principal problem not specified>       Patient Active Problem List    Diagnosis Date Noted  . Gait disturbance, post-stroke 10/20/2016  . Hemiparesis affecting left side as late effect of stroke (HCC) 10/20/2016  . Essential hypertension 10/19/2016  . Hyperlipidemia LDL goal <70 10/19/2016  . 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  . Left hemiparesis (HCC)    . Atrial fibrillation (HCC)    . History of fall    . History of traumatic subdural hematoma    . Vascular headache    . Hypokalemia    . Leukocytosis    . Acute blood loss anemia    . Hypoalbuminemia due to protein-calorie malnutrition (HCC)    . Dysphagia, post-stroke    . Acute respiratory failure (HCC)    . Acute embolic stroke (HCC) - R putamen/caudate and R insular infarcts s/p TICI3 revascularization w/ mechanical thrombectomy d/t AF not on [facility] 10/15/2016  . Pressure injury of skin 10/15/2016  . HCAP (healthcare-associated pneumonia) 11/09/2014  . Weakness 08/25/2014  . Acute bronchitis 08/25/2014  . UTI (lower urinary tract infection) 08/25/2014  . Fracture of lumbar spine (HCC) 08/25/2014  . Subdural hematoma, post-traumatic (HCC) 05/12/2014  . Chronic atrial fibrillation (HCC) 05/12/2014  . Diabetes mellitus (HCC) 05/12/2014   . OSA on CPAP 05/12/2014      Expected Discharge Date: Expected Discharge Date: 11/01/16   Team Members Present: Physician leading conference: Dr. Claudette Laws Social Worker Present: Lauren Der, LCSW Nurse Present: Lauren End, RN PT Present: [name], [name], PT OT Present: [name], OT SLP Present: [name], SLP PPS Coordinator present : [name]       Current Status/Progress Goal Weekly Team Focus  Medical   Patient with some ear pain, resolving, increased awareness of left-sided weakness and numbness, urinary frequency  Maintain medical stability during rehabilitation stay  Evaluate and treat, urinary frequency, urinalysis pending   Bowel/Bladder   last bm 1/7, continent of b/b, up to br with min assist   remain continent, bowel movement every 2-3 days  montitor q shift and prn   Swallow/Nutrition/ Hydration   Diet advanced to dys 3, nectar thick liquids; trials of regular water per the water protocol   supervision   completion of education with family, trials of advanced consistency    ADL's   supervision to min assist level for bathing, dressing, toileting tasks.  Slight decreased coordination and speed in the LUE but uses functionally at a diminished level.  modified independent mostly  selfcare retraining, neuromuscular re-education, balance retraining, transfer training, therapeutic exercise, and pt/family education   Mobility   min assist overall without AD; pt does not want to use RW  modified  indpendent basic transfers and gait x 150' in controlled env and x 50' in home env , supervision 4 steps L rail to access home  neuro re-ed, balance, mobility, locomotion, pt and family ed, DME   Communication             Safety/Cognition/ Behavioral Observations slight impairments for safety awareness and recall of new information   supervision   education and carryover of compensatory strategies to maximize safety in the home environment    Pain   pt had c/o pain to forehead after CVA, past few days only c/o pain to right ear, pt has fioricet ordered   maintain pain <3  monitor pain q shift and prn   Skin   skin is healthy, only allevyn to bilat groin for fem pop sites, healing.  maintain skin without breakdown while on rehab  monitor q shift and prn       *See Care Plan and progress notes for long and short-term goals.   Barriers to Discharge: See above   Possible Resolutions to Barriers:  Depending on workup, treatment for UTI versus spastic bladder   Discharge Planning/Teaching Needs:  Home with daughter and her family. Daughter and son in-law work during the day, other daughter will come and check on her while they are gone.      Team Discussion:  Goals-supervision level due to special diet and safety with walker. Pt wishes not to use a walker, needs to be cued to use one. Headaches pain managed by MD. Loss of balance at times and some inattention to the left. Daughter made aware will need 24 hr supervision at discharge. Dys 2 nectar thick liquids.   Revisions to Treatment Plan:  Downgraded goals to supervision level    Continued Need for Acute Rehabilitation Level of Care: The patient requires daily medical management by a physician with specialized training in physical medicine and rehabilitation for the following conditions: Daily direction of a multidisciplinary physical rehabilitation program to ensure safe treatment while eliciting the highest outcome that is of practical value to the patient.: Yes Daily medical management of patient stability for increased activity during participation in an intensive rehabilitation regime.: Yes Daily analysis of laboratory values and/or radiology reports with any subsequent need for medication adjustment of medical intervention for : Neurological problems;Urological problems   Lauren ChrisDupree, Koleen Celia G 10/26/2016, 12:56 PM       Patient ID: [name], female   DOB: 08/14/1941, 76 y.o.    MRN: [id number]

## 2016-10-26 NOTE — Patient Care Conference (Signed)
Inpatient RehabilitationTeam Conference and Plan of Care Update Date: 10/26/2016   Time: 10:30 AM    Patient Name: [name]      Medical Record Number: [id number]  Date of Birth: 04/21/1941 Sex: Female         Room/Bed: 4W04C/4W04C-01 Payor Info: Payor: MEDICARE / Plan: MEDICARE PART A AND B / Product Type: *No Product type* /    Admitting Diagnosis: R CVA  Admit Date/Time:  10/19/2016  4:53 PM Admission Comments: No comment available   Primary Diagnosis:  <principal problem not specified> Principal Problem: <principal problem not specified>  Patient Active Problem List   Diagnosis Date Noted  . Gait disturbance, post-stroke 10/20/2016  . Hemiparesis affecting left side as late effect of stroke (HCC) 10/20/2016  . Essential hypertension 10/19/2016  . Hyperlipidemia LDL goal <70 10/19/2016  . 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  . Left hemiparesis (HCC)   . Atrial fibrillation (HCC)   . History of fall   . History of traumatic subdural hematoma   . Vascular headache   . Hypokalemia   . Leukocytosis   . Acute blood loss anemia   . Hypoalbuminemia due to protein-calorie malnutrition (HCC)   . Dysphagia, post-stroke   . Acute respiratory failure (HCC)   . Acute embolic stroke (HCC) - R putamen/caudate and R insular infarcts s/p TICI3 revascularization w/ mechanical thrombectomy d/t AF not on [facility] 10/15/2016  . Pressure injury of skin 10/15/2016  . HCAP (healthcare-associated pneumonia) 11/09/2014  . Weakness 08/25/2014  . Acute bronchitis 08/25/2014  . UTI (lower urinary tract infection) 08/25/2014  . Fracture of lumbar spine (HCC) 08/25/2014  . Subdural hematoma, post-traumatic (HCC) 05/12/2014  . Chronic atrial fibrillation (HCC) 05/12/2014  . Diabetes mellitus (HCC) 05/12/2014  . OSA on CPAP 05/12/2014    Expected Discharge Date: Expected Discharge Date: 11/01/16  Team Members Present: Physician leading conference: Dr. Claudette LawsAndrew Kirsteins Social Worker Present: Dossie DerBecky  Shawna Wearing, LCSW Nurse Present: Carmie EndAngie Joyce, RN PT Present: Wanda Plumparoline Cook, Antonietta JewelPT;Austin Tucker, PT OT Present: Perrin MalteseJames McGuire, OT SLP Present: Jackalyn LombardNicole Page, SLP PPS Coordinator present : Tora DuckMarie Noel, RN, CRRN     Current Status/Progress Goal Weekly Team Focus  Medical   Patient with some ear pain, resolving, increased awareness of left-sided weakness and numbness, urinary frequency  Maintain medical stability during rehabilitation stay  Evaluate and treat, urinary frequency, urinalysis pending   Bowel/Bladder   last bm 1/7, continent of b/b, up to br with min assist   remain continent, bowel movement every 2-3 days  montitor q shift and prn   Swallow/Nutrition/ Hydration   Diet advanced to dys 3, nectar thick liquids; trials of regular water per the water protocol   supervision   completion of education with family, trials of advanced consistency    ADL's   supervision to min assist level for bathing, dressing, toileting tasks.  Slight decreased coordination and speed in the LUE but uses functionally at a diminished level.  modified independent mostly  selfcare retraining, neuromuscular re-education, balance retraining, transfer training, therapeutic exercise, and pt/family education   Mobility   min assist overall without AD; pt does not want to use RW  modified indpendent basic transfers and gait x 150' in controlled env and x 50' in home env , supervision 4 steps L rail to access home  neuro re-ed, balance, mobility, locomotion, pt and family ed, DME   Communication             Safety/Cognition/ Behavioral Observations  slight impairments for  safety awareness and recall of new information   supervision   education and carryover of compensatory strategies to maximize safety in the home environment   Pain   pt had c/o pain to forehead after CVA, past few days only c/o pain to right ear, pt has fioricet ordered   maintain pain <3  monitor pain q shift and prn   Skin   skin is healthy, only  allevyn to bilat groin for fem pop sites, healing.  maintain skin without breakdown while on rehab  monitor q shift and prn      *See Care Plan and progress notes for long and short-term goals.  Barriers to Discharge: See above    Possible Resolutions to Barriers:  Depending on workup, treatment for UTI versus spastic bladder    Discharge Planning/Teaching Needs:  Home with daughter and her family. Daughter and son in-law work during the day, other daughter will come and check on her while they are gone.      Team Discussion:  Goals-supervision level due to special diet and safety with walker. Pt wishes not to use a walker, needs to be cued to use one. Headaches pain managed by MD. Loss of balance at times and some inattention to the left. Daughter made aware will need 24 hr supervision at discharge. Dys 2 nectar thick liquids.   Revisions to Treatment Plan:  Downgraded goals to supervision level   Continued Need for Acute Rehabilitation Level of Care: The patient requires daily medical management by a physician with specialized training in physical medicine and rehabilitation for the following conditions: Daily direction of a multidisciplinary physical rehabilitation program to ensure safe treatment while eliciting the highest outcome that is of practical value to the patient.: Yes Daily medical management of patient stability for increased activity during participation in an intensive rehabilitation regime.: Yes Daily analysis of laboratory values and/or radiology reports with any subsequent need for medication adjustment of medical intervention for : Neurological problems;Urological problems  Marshal Schrecengost, Lemar Livings 10/26/2016, 12:56 PM

## 2016-10-26 NOTE — Progress Notes (Addendum)
Physical Therapy Session Note  Patient Details  Name: [name] MRN: [id number] Date of Birth: 12-26-1940  Today's Date: 10/26/2016 PT Individual Time: 1131-1200 AND 1602-1700 PT Individual Time Calculation (min): 29 min AND 58 min    Short Term Goals: Week 1:  PT Short Term Goal 1 (Week 1): STG = LTG due to estimated length of stay   Skilled Therapeutic Interventions/Progress Updates:     Session 1:   Pt received supine in bed and agreeable to PT. Supine>sit transfer with supervision  assist with use of rails  andmin cues for safety  Gait with RW to and from rehab gym 2x 132f and 60. PR provided supervision assist progressing to Min assist due to poor foot clearance on the LLE as well as poor weight shift to the R. PT  Also required to provide mod cues for decreased speed to decrease shuffle gait.   Foot taps on 6 inch cones for imrpoved hip/knee flexion on the LLE and improved weight shift to the R with BUE on RW. Following reciprocal tapping, patient noted to have improved gait pattern.   Patient returned too room and left sitting in EOB with call bell in reach and all needs met.    Session 2:   Pt received supine in bed and agreeable to PT. Supine>sit transfer with supervision assist and min cues for decreaed use of rails as tolerated.   PT [facility] Gait in simulated community environment of gift shop with RW x [id number] supervision assist from PT only min cues for improved foot clearance/weight shifting as well as decreased speed required. Additional gait in simulated community environment through [employer] [place] with supervision assist x [id number]  Dynamic balance training.  Standing on airex, lateral reach and Horse shoe toss, 2x 10 BUE. Supervision assist from PT. Patient required to ambulate short distances without AD to pick up horse shoes. Close supervision from PT.   Dynamic gait through obstacle course with RW including weave through  4 cones, stepping up/down 6 inch curb and stepping over 4 inch obstacle. PT provided close supervision assist throughout as well as moderate cues for AD management.   Gait training through hall x 75 ft and [id number] RW close supervision assist from PT. Min cues at end of gait for decreased cadence and increased step length due to fatigue.   PT educated patient and daughter who was present for both session on need for 24 hour supervision as well as use of RW of all mobility in house and community at d/c.   Patient returned too room and left sitting in EOB with call bell in reach and all needs met.       Therapy Documentation Precautions:  Precautions Precautions: Fall Restrictions Weight Bearing Restrictions: No General:   Vital Signs: Therapy Vitals Temp: 97.6 F (36.4 C) Temp Source: Oral Pulse Rate: 82 Resp: 18 BP: (!) 103/57 Patient Position (if appropriate): Lying Oxygen Therapy SpO2: 98 % O2 Device: Not Delivered Pain: Pain Assessment Pain Assessment: No/denies pain   See Function Navigator for Current Functional Status.   Therapy/Group: Individual Therapy  AustLorie Phenix0/2018, 4:36 PM

## 2016-10-26 NOTE — Progress Notes (Signed)
Subjective/Complaints: Daughter at bedside, ear pain is better Standing with therapy. Occasional palpitations   Review systems: No problems with breathing. No problems with bowels, urinary frequency, no burning Objective: Vital Signs: Blood pressure 118/68, pulse 76, temperature 97.7 F (36.5 C), temperature source Oral, resp. rate 16, height '4\' 10"'  (1.473 m), weight 60.1 kg (132 lb 8 oz), SpO2 97 %. Dg Swallowing Func-speech Pathology  Result Date: 10/25/2016 Objective Swallowing Evaluation: Type of Study: MBS-Modified Barium Swallow Study Patient Details Name: Ahleah Simko MRN: [id number] Date of Birth: [birth year] Today's Date: 10/25/2016 Time: SLP Start Time 0920-SLP Stop Time: 0940 SLP Time Calculation (min) : 20 min Past Medical History: Past Medical History: Diagnosis Date . Atrial fibrillation ([place])  . Chronic back pain   "mid back down into lower back" (08/25/2014) . GERD (gastroesophageal reflux disease)  . Hypertriglyceridemia  . OSA on CPAP  . Osteoarthritis   "knees, hands, back" (08/25/2014) . Pneumonia ~ 2000 X 1 . Subdural hematoma ([place]) july 2015  S/P fall while on Coumadin . T12 compression fracture ([place]) 2012 . Type II diabetes mellitus ([place])  Past Surgical History: Past Surgical History: Procedure Laterality Date . APPENDECTOMY  2012 . CATARACT EXTRACTION W/ INTRAOCULAR LENS  IMPLANT, BILATERAL Bilateral 2000's . IR GENERIC HISTORICAL  10/15/2016  IR PERCUTANEOUS ART THROMBECTOMY/INFUSION INTRACRANIAL INC DIAG ANGIO 10/15/2016 [name], MD [facility] . RADIOLOGY WITH ANESTHESIA N/A 10/15/2016  Procedure: RADIOLOGY WITH ANESTHESIA;  Surgeon: [name], MD;  Location: [facility];  Service: Radiology;  Laterality: N/A; . TOTAL ABDOMINAL HYSTERECTOMY  1990 HPI: 76 y.o.femalewith history of diabetes mellitus, subdural hematoma in 2015, previous pneumonia, obstructive sleep apnea on C Pap, dyslipidemia, and atrial fibrillation not anticoagulated secondary to subdural hematoma  history presenting with left facial droop, left hemiplegia, and right gaze deviation. 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  Initiated on Dys 1 diet with honey thick liquids per MBS.  Admitted to rehab and repeat MBS ordered to determine readiness to advanced given improvements noted at bedside.   Subjective: alert, dtr present for translation Assessment / Plan / Recommendation CHL IP CLINICAL IMPRESSIONS 10/25/2016 Therapy Diagnosis Mild oral phase dysphagia;Mild pharyngeal phase dysphagia Clinical Impression Pt presents with improved airway protection in comparison to previous objective study.  Pt's swallow is delayed to the level of the pyriforms with most consistencies which results in consistent deep flash penetration with thin liquids via straws only.  No aspiration or penetration was visualized with nectar thick liquids or small controlled cup sips of thin liquids; however, given pt's impulsivity with intake at times recommend slow diet progression to Dys 3 textures and nectar thick liquids with sips of regular water in between meals to continue working towards diet progression.  Pt was noted with intermittent coughing episodes during study which did not correlate with airway invasion.  Prognosis for continued advancement is good with ongoing ST interventions for use of swallowing precautions.   Impact on safety and function Mild aspiration risk     Prognosis 10/25/2016 Prognosis for Safe Diet Advancement Good Barriers to Reach Goals -- Barriers/Prognosis Comment -- CHL IP DIET RECOMMENDATION 10/25/2016 SLP Diet Recommendations Dysphagia 3 (Mech soft) solids;Nectar thick liquid;Free water protocol after oral care Liquid Administration via Cup Medication Administration Crushed with puree Compensations Slow rate;Small sips/bites;Lingual sweep for clearance of pocketing;Monitor for anterior loss Postural Changes Seated upright at 90  degrees            CHL IP  ORAL PHASE 10/25/2016 Oral Phase Impaired Oral - Pudding Teaspoon -- Oral - Pudding Cup -- Oral - Honey Teaspoon -- Oral - Honey Cup Weak lingual manipulation;Decreased bolus cohesion;Premature spillage Oral - Nectar Teaspoon -- Oral - Nectar Cup Weak lingual manipulation;Decreased bolus cohesion;Premature spillage Oral - Nectar Straw -- Oral - Thin Teaspoon -- Oral - Thin Cup Weak lingual manipulation;Decreased bolus cohesion;Premature spillage Oral - Thin Straw Decreased bolus cohesion;Premature spillage Oral - Puree Weak lingual manipulation;Decreased bolus cohesion;Premature spillage Oral - Mech Soft -- Oral - Regular Impaired mastication;Delayed oral transit;Decreased bolus cohesion Oral - Multi-Consistency -- Oral - Pill -- Oral Phase - Comment --  CHL IP PHARYNGEAL PHASE 10/25/2016 Pharyngeal Phase Impaired Pharyngeal- Pudding Teaspoon -- Pharyngeal -- Pharyngeal- Pudding Cup -- Pharyngeal -- Pharyngeal- Honey Teaspoon -- Pharyngeal -- Pharyngeal- Honey Cup Delayed swallow initiation-pyriform sinuses;Reduced tongue base retraction Pharyngeal -- Pharyngeal- Nectar Teaspoon -- Pharyngeal -- Pharyngeal- Nectar Cup Reduced tongue base retraction;Delayed swallow initiation-pyriform sinuses Pharyngeal -- Pharyngeal- Nectar Straw -- Pharyngeal -- Pharyngeal- Thin Teaspoon -- Pharyngeal -- Pharyngeal- Thin Cup Delayed swallow initiation-pyriform sinuses;Reduced tongue base retraction Pharyngeal -- Pharyngeal- Thin Straw Delayed swallow initiation-pyriform sinuses;Reduced tongue base retraction;Penetration/Aspiration during swallow Pharyngeal Material enters airway, remains ABOVE vocal cords then ejected out Pharyngeal- Puree Delayed swallow initiation-vallecula;Reduced tongue base retraction Pharyngeal -- Pharyngeal- Mechanical Soft -- Pharyngeal -- Pharyngeal- Regular Delayed swallow initiation-vallecula;Reduced tongue base retraction Pharyngeal -- Pharyngeal- Multi-consistency -- Pharyngeal --  Pharyngeal- Pill Reduced tongue base retraction;Delayed swallow initiation-vallecula Pharyngeal -- Pharyngeal Comment --  CHL IP CERVICAL ESOPHAGEAL PHASE 10/25/2016 Cervical Esophageal Phase WFL Pudding Teaspoon -- Pudding Cup -- Honey Teaspoon -- Honey Cup -- Nectar Teaspoon -- Nectar Cup -- Nectar Straw -- Thin Teaspoon -- Thin Cup -- Thin Straw -- Puree -- Mechanical Soft -- Regular -- Multi-consistency -- Pill -- Cervical Esophageal Comment -- No flowsheet data found. Page, Selinda Orion 10/25/2016, 9:59 AM              Results for orders placed or performed during the hospital encounter of 10/19/16 (from the past 72 hour(s))  Glucose, capillary     Status: Abnormal   Collection Time: 10/23/16 11:50 AM  Result Value Ref Range   Glucose-Capillary 109 (H) 65 - 99 mg/dL  Glucose, capillary     Status: Abnormal   Collection Time: 10/23/16  5:20 PM  Result Value Ref Range   Glucose-Capillary 120 (H) 65 - 99 mg/dL  Glucose, capillary     Status: Abnormal   Collection Time: 10/23/16  9:29 PM  Result Value Ref Range   Glucose-Capillary 123 (H) 65 - 99 mg/dL  Glucose, capillary     Status: Abnormal   Collection Time: 10/24/16  6:55 AM  Result Value Ref Range   Glucose-Capillary 129 (H) 65 - 99 mg/dL  Glucose, capillary     Status: None   Collection Time: 10/24/16 12:02 PM  Result Value Ref Range   Glucose-Capillary 95 65 - 99 mg/dL  Glucose, capillary     Status: Abnormal   Collection Time: 10/25/16 11:31 AM  Result Value Ref Range   Glucose-Capillary 113 (H) 65 - 99 mg/dL  Basic metabolic panel     Status: Abnormal   Collection Time: 10/25/16 11:32 AM  Result Value Ref Range   Sodium 141 135 - 145 mmol/L   Potassium 3.8 3.5 - 5.1 mmol/L   Chloride 104 101 - 111 mmol/L   CO2 27 22 - 32 mmol/L   Glucose, Bld 111 (H) 65 - 99  mg/dL   BUN 12 6 - 20 mg/dL   Creatinine, Ser 0.57 0.44 - 1.00 mg/dL   Calcium 9.3 8.9 - 10.3 mg/dL   GFR calc non Af Amer >60 >60 mL/min   GFR calc Af Amer >60 >60  mL/min    Comment: (NOTE) The eGFR has been calculated using the CKD EPI equation. This calculation has not been validated in all clinical situations. eGFR's persistently <60 mL/min signify possible Chronic Kidney Disease.    Anion gap 10 5 - 15  Glucose, capillary     Status: Abnormal   Collection Time: 10/25/16  4:47 PM  Result Value Ref Range   Glucose-Capillary 110 (H) 65 - 99 mg/dL  Glucose, capillary     Status: Abnormal   Collection Time: 10/25/16  9:02 PM  Result Value Ref Range   Glucose-Capillary 105 (H) 65 - 99 mg/dL  Glucose, capillary     Status: None   Collection Time: 10/26/16  6:37 AM  Result Value Ref Range   Glucose-Capillary 93 65 - 99 mg/dL     HEENT: normal, Right TM is clear, right external canal is clear Cardio: RRR and No murmur or extra sounds Resp: CTA B/L and Unlabored GI: BS positive and Nontender, nondistended Extremity:  No Edema Skin:   Intact Neuro: Alert/Oriented, Abnormal Sensory Reports reduced LT sensation on Left hand compared to Right, Abnormal Motor 4- Left delt Bi Tri Grip, 4+ Left HF, KE, 3- L ankle DF, Abnormal FMC Ataxic/ dec FMC and Tone:  Within Normal Limits Musc/Skel:  Other ful ROM in UEs, no pain with LE AROM Gen NAD   Assessment/Plan: 1. 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 Physiatrist is providing close team supervision and 24 hour management of active medical problems listed below. Physiatrist and rehab team continue to assess barriers to discharge/monitor patient progress toward functional and medical goals. FIM: Function - Bathing Position: Shower Body parts bathed by patient: Right arm, Left arm, Chest, Abdomen, Front perineal area, Right upper leg, Left upper leg, Right lower leg, Left lower leg Body parts bathed by helper: Buttocks, Back Assist Level: Touching or steadying  assistance(Pt > 75%)  Function- Upper Body Dressing/Undressing What is the patient wearing?: Pull over shirt/dress Bra - Perfomed by patient: Thread/unthread right bra strap, Thread/unthread left bra strap Bra - Perfomed by helper: Hook/unhook bra (pull down sports bra) Pull over shirt/dress - Perfomed by patient: Thread/unthread right sleeve, Thread/unthread left sleeve, Put head through opening, Pull shirt over trunk Pull over shirt/dress - Perfomed by helper: Thread/unthread right sleeve, Thread/unthread left sleeve, Put head through opening, Pull shirt over trunk Button up shirt - Perfomed by patient: Thread/unthread right sleeve, Button/unbutton shirt Button up shirt - Perfomed by helper: Thread/unthread left sleeve, Pull shirt around back Assist Level: Supervision or verbal cues Function - Lower Body Dressing/Undressing What is the patient wearing?: Underwear, Non-skid slipper socks, Pants Position: Other (comment) (sitting on tub bench) Underwear - Performed by patient: Thread/unthread right underwear leg, Thread/unthread left underwear leg, Pull underwear up/down Underwear - Performed by helper: Thread/unthread right underwear leg, Thread/unthread left underwear leg, Pull underwear up/down Pants- Performed by patient: Thread/unthread right pants leg, Thread/unthread left pants leg, Pull pants up/down Pants- Performed by helper: Thread/unthread right pants leg, Thread/unthread left pants leg, Pull pants up/down, Fasten/unfasten pants Non-skid slipper socks- Performed by patient: Don/doff right sock, Don/doff left sock Non-skid  slipper socks- Performed by helper: Don/doff right sock, Don/doff left sock TED Hose - Performed by helper: Don/doff right TED hose, Don/doff left TED hose Assist for footwear: Setup Assist for lower body dressing: Supervision or verbal cues  Function - Toileting Toileting steps completed by patient: Adjust clothing prior to toileting, Performs perineal hygiene,  Adjust clothing after toileting Toileting Assistive Devices: Grab bar or rail Assist level: Supervision or verbal cues  Function - Air cabin crew transfer assistive device: Grab bar Assist level to toilet: Touching or steadying assistance (Pt > 75%) Assist level from toilet: Touching or steadying assistance (Pt > 75%) Assist level to bedside commode (at bedside): Touching or steadying assistance (Pt > 75%) (per Benjamin Stain, NT) Assist level from bedside commode (at bedside): Touching or steadying assistance (Pt > 75%)  Function - Chair/bed transfer Chair/bed transfer method: Ambulatory Chair/bed transfer assist level: Touching or steadying assistance (Pt > 75%) Chair/bed transfer assistive device: Armrests, Bedrails Chair/bed transfer details: Verbal cues for safe use of DME/AE, Verbal cues for technique  Function - Locomotion: Wheelchair Will patient use wheelchair at discharge?: No Type: Manual Max wheelchair distance: 25f  Assist Level: Touching or steadying assistance (Pt > 75%) Assist Level: Touching or steadying assistance (Pt > 75%) Wheel 150 feet activity did not occur: Safety/medical concerns Turns around,maneuvers to table,bed, and toilet,negotiates 3% grade,maneuvers on rugs and over doorsills: No Function - Locomotion: Ambulation Assistive device: No device Max distance: 150' Assist level: Supervision or verbal cues Assist level: Supervision or verbal cues Assist level: Touching or steadying assistance (Pt > 75%) Walk 150 feet activity did not occur: Refused Assist level: Touching or steadying assistance (Pt > 75%) Assist level: Touching or steadying assistance (Pt > 75%)  Function - Comprehension Comprehension: Auditory Comprehension assist level: Follows basic conversation/direction with no assist  Function - Expression Expression: Verbal Expression assist level: Expresses basic needs/ideas: With no assist  Function - Social Interaction Social  Interaction assist level: Interacts appropriately 90% of the time - Needs monitoring or encouragement for participation or interaction.  Function - Problem Solving Problem solving assist level: Solves basic 75 - 89% of the time/requires cueing 10 - 24% of the time  Function - Memory Memory assist level: Recognizes or recalls 90% of the time/requires cueing < 10% of the time Patient normally able to recall (first 3 days only): Current season, Location of own room, Staff names and faces, That he or she is in a hospital  Medical Problem List and Plan: 1.  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  DVT Prophylaxis/Anticoagulation: Subcutaneous Lovenox. Monitor for any bleeding episodes 3. Pain Management/headaches: Fioricet as needed 4. Dysphagia. Dysphagia #1 Honey thick liquid diet. Monitor hydration. Follow-up speech therapy, 5. Neuropsych: This patient is capable of making decisions on her own behalf. 6. Skin/Wound Care: Routine skin checks 7. Fluids/Electrolytes/Nutrition: Routine I&O with follow-up chemistries, intake , improving her fluid intake ~5066m1/9 8. Diabetes mellitus peripheral neuropathy. Hemoglobin A1c 6.1.SSI. Check blood sugars before meals and at bedtime. Patient on Glucophage 500 mg twice a day prior to admission, no need to resume at the current time CBG (last 3)   Recent Labs  10/25/16 1647 10/25/16 2102 10/26/16 0637  GLUCAP 110* 105* 93    9. Atrial fibrillation. Patient on aspirin 81 mg daily prior to admission and monitor closely for history of falls with SDH. Cardiac  rate controlled. Await possible plan to begin Eliquis 5 mg twice a day 14 days after stroke Vitals:   10/25/16 2227 10/26/16 0446  BP: (!) 103/54 118/68  Pulse: 72 76  Resp:  16  Temp:  97.7 F (36.5 C)  HR trending up as is BP will start  lopressor 12.45m with parameters this am, Palpitations, likely faster runs of A. fib. Per daughter, patient had prior history of small subdural hematoma after a fall in 2015. This was why she was taken off anticoagulation. CVA on 12/30, possibly start DAOC 2 wks post , daughter spoke to PCP who is in favor in waiting for 3 weeks with CT scan performed prior to reinitiation 10.  Hypoalb pro-stat, twice a day 11.Urinary frequency. Check UA LOS (Days) 7 A FACE TO FACE EVALUATION WAS PERFORMED  Jefferey Lippmann E 10/26/2016, 9:03 AM

## 2016-10-27 ENCOUNTER — Inpatient Hospital Stay (HOSPITAL_COMMUNITY): Payer: Medicare Other | Admitting: Physical Therapy

## 2016-10-27 ENCOUNTER — Inpatient Hospital Stay (HOSPITAL_COMMUNITY): Payer: Medicare Other | Admitting: Occupational Therapy

## 2016-10-27 ENCOUNTER — Inpatient Hospital Stay (HOSPITAL_COMMUNITY): Payer: Medicare Other | Admitting: Speech Pathology

## 2016-10-27 LAB — GLUCOSE, CAPILLARY
GLUCOSE-CAPILLARY: 114 mg/dL — AB (ref 65–99)
GLUCOSE-CAPILLARY: 154 mg/dL — AB (ref 65–99)
Glucose-Capillary: 110 mg/dL — ABNORMAL HIGH (ref 65–99)
Glucose-Capillary: 115 mg/dL — ABNORMAL HIGH (ref 65–99)
Glucose-Capillary: 106 mg/dL — ABNORMAL HIGH (ref 65–99)

## 2016-10-27 LAB — URINE CULTURE: Culture: 10000 — AB

## 2016-10-27 NOTE — Progress Notes (Signed)
Speech Language Pathology Weekly Progress and Session Note  Patient Details  Name: [name] MRN: [id number] Date of Birth: [birth year]  Beginning of progress report period: October 20, 2016  End of progress report period: October 27, 2016  Today's Date: 10/27/2016 SLP Individual Time: 3893-7342 SLP Individual Time Calculation (min): 56 min   Short Term Goals: Week 1: SLP Short Term Goal 1 (Week 1): STG=LTG due to ELOS     New Short Term Goals: Week 2: SLP Short Term Goal 1 (Week 2): Continue working towards long term goals which are now upgraded to mod I.   Weekly Progress Updates:  Pt has made functional gains this reporting period and has met 5 out of 5 previously set long term goals.  Pt's diet has been upgraded to dys 3 textures and nectar thick liquids which she is consuming with supervision-mod I use of swallowing precautions.  Pt is also tolerating trials of regular, unthickened water per the water protocol with minimal overt s/s of aspiration, clear lung sounds, no changes in temperature, and O2 sats remain WFL on room air.  Pt is overall supervision for memory and safety awareness during basic, familiar tasks.  Pt and family education is ongoing.  SLP would continue to benefit from skilled ST while inpatient in order to maximize functional independence and reduce burden of care prior to discharge.       Intensity: Minumum of 1-2 x/day, 30 to 90 minutes Frequency: 3 to 5 out of 7 days Duration/Length of Stay: 7-10 days  Treatment/Interventions: Cognitive remediation/compensation;Cueing hierarchy;Dysphagia/aspiration precaution training;Environmental controls;Internal/external aids;Patient/family education   Daily Session  Skilled Therapeutic Interventions: Pt was seen for skilled ST targeting goals for cognition and dysphagia.  Pt consumed dys 3 textures and nectar thick liquids during breakfast meal with mod I use of swallowing precautions and no overt s/s of aspiration with  solids or liquids.  After completion of meal pt cleaned oral cavity with set up assist for obtaining items while standing at sink.  Pt initiated walker use with mod I and demonstrated improved safety awareness with ambulation.  Pt was able to identify safety issues in pictures and generate safe solutions to problems with supervision question cues.  Pt recalled route back to her room from [place] treatment room with mod I and was left sitting at edge of bed with RN and interpreter at bedside.  Goals updated on this date to reflect current progress and plan of care.        Function:   Eating Eating   Modified Consistency Diet: Yes Eating Assist Level: Set up assist for;Supervision or verbal cues   Eating Set Up Assist For: Opening containers       Cognition Comprehension Comprehension assist level: Follows basic conversation/direction with no assist  Expression   Expression assist level: Expresses basic needs/ideas: With no assist  Social Interaction Social Interaction assist level: Interacts appropriately 90% of the time - Needs monitoring or encouragement for participation or interaction.  Problem Solving Problem solving assist level: Solves basic 90% of the time/requires cueing < 10% of the time  Memory Memory assist level: Recognizes or recalls 75 - 89% of the time/requires cueing 10 - 24% of the time   General    Pain Pain Assessment Pain Assessment: No/denies pain  Therapy/Group: Individual Therapy  [name], Selinda Orion 10/27/2016, 9:16 AM

## 2016-10-27 NOTE — Progress Notes (Signed)
Subjective/Complaints: Interpreter at bedside, female family member also at bedside. Patient complaining of numbness, but in both legs. No significant pain. She does have some mid back pain. No loss of bowel, bladder function. Reviewed urine, negative UA, await cx   Review systems: No problems with breathing. No problems with bowels, urinary frequency, no burning Objective: Vital Signs: Blood pressure 123/72, pulse 82, temperature 97.6 F (36.4 C), temperature source Oral, resp. rate 17, height '4\' 10"'  (1.473 m), weight 60.1 kg (132 lb 8 oz), SpO2 98 %. Dg Swallowing Func-speech Pathology  Result Date: 10/25/2016 Objective Swallowing Evaluation: Type of Study: MBS-Modified Barium Swallow Study Patient Details Name: [name] MRN: [id number] Date of Birth: Jan 30, 1941 Today's Date: 10/25/2016 Time: SLP Start Time 0920-SLP Stop Time: 0940 SLP Time Calculation (min) : 20 min Past Medical History: Past Medical History: Diagnosis Date . Atrial fibrillation ([place])  . Chronic back pain   "mid back down into lower back" (08/25/2014) . GERD (gastroesophageal reflux disease)  . Hypertriglyceridemia  . OSA on CPAP  . Osteoarthritis   "knees, hands, back" (08/25/2014) . Pneumonia ~ 2000 X 1 . Subdural hematoma ([place]) july 2015  S/P fall while on Coumadin . T12 compression fracture ([place]) 2012 . Type II diabetes mellitus ([place])  Past Surgical History: Past Surgical History: Procedure Laterality Date . APPENDECTOMY  2012 . CATARACT EXTRACTION W/ INTRAOCULAR LENS  IMPLANT, BILATERAL Bilateral 2000's . IR GENERIC HISTORICAL  10/15/2016  IR PERCUTANEOUS ART THROMBECTOMY/INFUSION INTRACRANIAL INC DIAG ANGIO 10/15/2016 [name], MD [facility] . RADIOLOGY WITH ANESTHESIA N/A 10/15/2016  Procedure: RADIOLOGY WITH ANESTHESIA;  Surgeon: [name], MD;  Location: [facility];  Service: Radiology;  Laterality: N/A; . TOTAL ABDOMINAL HYSTERECTOMY  1990 HPI: 76 y.o.femalewith history of diabetes mellitus, subdural hematoma in  2015, previous pneumonia, obstructive sleep apnea on C Pap, dyslipidemia, and atrial fibrillation not anticoagulated secondary to subdural hematoma history presenting with left facial droop, left hemiplegia, and right gaze deviation. 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  Initiated on Dys 1 diet with honey thick liquids per MBS.  Admitted to rehab and repeat MBS ordered to determine readiness to advanced given improvements noted at bedside.   Subjective: alert, dtr present for translation Assessment / Plan / Recommendation CHL IP CLINICAL IMPRESSIONS 10/25/2016 Therapy Diagnosis Mild oral phase dysphagia;Mild pharyngeal phase dysphagia Clinical Impression Pt presents with improved airway protection in comparison to previous objective study.  Pt's swallow is delayed to the level of the pyriforms with most consistencies which results in consistent deep flash penetration with thin liquids via straws only.  No aspiration or penetration was visualized with nectar thick liquids or small controlled cup sips of thin liquids; however, given pt's impulsivity with intake at times recommend slow diet progression to Dys 3 textures and nectar thick liquids with sips of regular water in between meals to continue working towards diet progression.  Pt was noted with intermittent coughing episodes during study which did not correlate with airway invasion.  Prognosis for continued advancement is good with ongoing ST interventions for use of swallowing precautions.   Impact on safety and function Mild aspiration risk     Prognosis 10/25/2016 Prognosis for Safe Diet Advancement Good Barriers to Reach Goals -- Barriers/Prognosis Comment -- CHL IP DIET RECOMMENDATION 10/25/2016 SLP Diet Recommendations Dysphagia 3 (Mech soft) solids;Nectar thick liquid;Free water protocol after oral care Liquid Administration via Cup Medication Administration Crushed with puree  Compensations Slow rate;Small sips/bites;Lingual sweep  for clearance of pocketing;Monitor for anterior loss Postural Changes Seated upright at 90 degrees            CHL IP ORAL PHASE 10/25/2016 Oral Phase Impaired Oral - Pudding Teaspoon -- Oral - Pudding Cup -- Oral - Honey Teaspoon -- Oral - Honey Cup Weak lingual manipulation;Decreased bolus cohesion;Premature spillage Oral - Nectar Teaspoon -- Oral - Nectar Cup Weak lingual manipulation;Decreased bolus cohesion;Premature spillage Oral - Nectar Straw -- Oral - Thin Teaspoon -- Oral - Thin Cup Weak lingual manipulation;Decreased bolus cohesion;Premature spillage Oral - Thin Straw Decreased bolus cohesion;Premature spillage Oral - Puree Weak lingual manipulation;Decreased bolus cohesion;Premature spillage Oral - Mech Soft -- Oral - Regular Impaired mastication;Delayed oral transit;Decreased bolus cohesion Oral - Multi-Consistency -- Oral - Pill -- Oral Phase - Comment --  CHL IP PHARYNGEAL PHASE 10/25/2016 Pharyngeal Phase Impaired Pharyngeal- Pudding Teaspoon -- Pharyngeal -- Pharyngeal- Pudding Cup -- Pharyngeal -- Pharyngeal- Honey Teaspoon -- Pharyngeal -- Pharyngeal- Honey Cup Delayed swallow initiation-pyriform sinuses;Reduced tongue base retraction Pharyngeal -- Pharyngeal- Nectar Teaspoon -- Pharyngeal -- Pharyngeal- Nectar Cup Reduced tongue base retraction;Delayed swallow initiation-pyriform sinuses Pharyngeal -- Pharyngeal- Nectar Straw -- Pharyngeal -- Pharyngeal- Thin Teaspoon -- Pharyngeal -- Pharyngeal- Thin Cup Delayed swallow initiation-pyriform sinuses;Reduced tongue base retraction Pharyngeal -- Pharyngeal- Thin Straw Delayed swallow initiation-pyriform sinuses;Reduced tongue base retraction;Penetration/Aspiration during swallow Pharyngeal Material enters airway, remains ABOVE vocal cords then ejected out Pharyngeal- Puree Delayed swallow initiation-vallecula;Reduced tongue base retraction Pharyngeal -- Pharyngeal- Mechanical Soft -- Pharyngeal --  Pharyngeal- Regular Delayed swallow initiation-vallecula;Reduced tongue base retraction Pharyngeal -- Pharyngeal- Multi-consistency -- Pharyngeal -- Pharyngeal- Pill Reduced tongue base retraction;Delayed swallow initiation-vallecula Pharyngeal -- Pharyngeal Comment --  CHL IP CERVICAL ESOPHAGEAL PHASE 10/25/2016 Cervical Esophageal Phase WFL Pudding Teaspoon -- Pudding Cup -- Honey Teaspoon -- Honey Cup -- Nectar Teaspoon -- Nectar Cup -- Nectar Straw -- Thin Teaspoon -- Thin Cup -- Thin Straw -- Puree -- Mechanical Soft -- Regular -- Multi-consistency -- Pill -- Cervical Esophageal Comment -- No flowsheet data found. Page, Selinda Orion 10/25/2016, 9:59 AM              Results for orders placed or performed during the hospital encounter of 10/19/16 (from the past 72 hour(s))  Glucose, capillary     Status: None   Collection Time: 10/24/16 12:02 PM  Result Value Ref Range   Glucose-Capillary 95 65 - 99 mg/dL  Glucose, capillary     Status: Abnormal   Collection Time: 10/25/16 11:31 AM  Result Value Ref Range   Glucose-Capillary 113 (H) 65 - 99 mg/dL  Basic metabolic panel     Status: Abnormal   Collection Time: 10/25/16 11:32 AM  Result Value Ref Range   Sodium 141 135 - 145 mmol/L   Potassium 3.8 3.5 - 5.1 mmol/L   Chloride 104 101 - 111 mmol/L   CO2 27 22 - 32 mmol/L   Glucose, Bld 111 (H) 65 - 99 mg/dL   BUN 12 6 - 20 mg/dL   Creatinine, Ser 0.57 0.44 - 1.00 mg/dL   Calcium 9.3 8.9 - 10.3 mg/dL   GFR calc non Af Amer >60 >60 mL/min   GFR calc Af Amer >60 >60 mL/min    Comment: (NOTE) The eGFR has been calculated using the CKD EPI equation. This calculation has not been validated in all clinical situations. eGFR's persistently <60 mL/min signify possible Chronic Kidney Disease.    Anion gap 10 5 - 15  Glucose, capillary     Status:  Abnormal   Collection Time: 10/25/16  4:47 PM  Result Value Ref Range   Glucose-Capillary 110 (H) 65 - 99 mg/dL  Glucose, capillary     Status: Abnormal    Collection Time: 10/25/16  9:02 PM  Result Value Ref Range   Glucose-Capillary 105 (H) 65 - 99 mg/dL  Glucose, capillary     Status: None   Collection Time: 10/26/16  6:37 AM  Result Value Ref Range   Glucose-Capillary 93 65 - 99 mg/dL  Urinalysis, Routine w reflex microscopic     Status: Abnormal   Collection Time: 10/26/16 11:04 AM  Result Value Ref Range   Color, Urine YELLOW YELLOW   APPearance CLEAR CLEAR   Specific Gravity, Urine 1.013 1.005 - 1.030   pH 7.0 5.0 - 8.0   Glucose, UA NEGATIVE NEGATIVE mg/dL   Hgb urine dipstick NEGATIVE NEGATIVE   Bilirubin Urine NEGATIVE NEGATIVE   Ketones, ur NEGATIVE NEGATIVE mg/dL   Protein, ur NEGATIVE NEGATIVE mg/dL   Nitrite NEGATIVE NEGATIVE   Leukocytes, UA TRACE (A) NEGATIVE   RBC / HPF 0-5 0 - 5 RBC/hpf   WBC, UA 6-30 0 - 5 WBC/hpf   Bacteria, UA NONE SEEN NONE SEEN   Squamous Epithelial / LPF 0-5 (A) NONE SEEN  Creatinine, serum     Status: None   Collection Time: 10/26/16 12:16 PM  Result Value Ref Range   Creatinine, Ser 0.66 0.44 - 1.00 mg/dL   GFR calc non Af Amer >60 >60 mL/min   GFR calc Af Amer >60 >60 mL/min    Comment: (NOTE) The eGFR has been calculated using the CKD EPI equation. This calculation has not been validated in all clinical situations. eGFR's persistently <60 mL/min signify possible Chronic Kidney Disease.      HEENT: normal, Right TM is clear, right external canal is clear Cardio: RRR and No murmur or extra sounds Resp: CTA B/L and Unlabored GI: BS positive and Nontender, nondistended Extremity:  No Edema Skin:   Intact Neuro: Alert/Oriented, Normal Sensory , light touch and proprioception in upper and lower limbs. Abnormal Motor 4- Left delt Bi Tri Grip, 4+ Left HF, KE, 3- L ankle DF, Abnormal FMC Ataxic/ dec FMC and Tone:  Within Normal Limits Musc/Skel:  Other ful ROM in UEs, no pain with LE AROM Gen NAD   Assessment/Plan: 1. 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 Physiatrist is providing close team supervision and 24 hour management of active medical problems listed below. Physiatrist and rehab team continue to assess barriers to discharge/monitor patient progress toward functional and medical goals. FIM: Function - Bathing Position: Shower Body parts bathed by patient: Right arm, Left arm, Chest, Abdomen, Front perineal area, Right upper leg, Left upper leg, Right lower leg, Left lower leg, Buttocks Body parts bathed by helper: Back Assist Level: Touching or steadying assistance(Pt > 75%)  Function- Upper Body Dressing/Undressing What is the patient wearing?: Pull over shirt/dress Bra - Perfomed by patient: Thread/unthread right bra strap, Thread/unthread left bra strap, Hook/unhook bra (pull down sports bra) Bra - Perfomed by helper: Hook/unhook bra (pull down sports bra) Pull over shirt/dress - Perfomed by patient: Thread/unthread right sleeve, Thread/unthread left sleeve, Put head through opening, Pull shirt over trunk Pull over shirt/dress - Perfomed by helper: Thread/unthread right sleeve, Thread/unthread left sleeve, Put head through opening, Pull shirt over trunk Button up shirt -  Perfomed by patient: Thread/unthread right sleeve, Button/unbutton shirt Button up shirt - Perfomed by helper: Thread/unthread left sleeve, Pull shirt around back Assist Level: Supervision or verbal cues Function - Lower Body Dressing/Undressing What is the patient wearing?: Underwear, Non-skid slipper socks, Pants, Ted Hose Position: Other (comment) (sitting on tub bench) Underwear - Performed by patient: Thread/unthread right underwear leg, Thread/unthread left underwear leg, Pull underwear up/down Underwear - Performed by helper: Thread/unthread right underwear leg, Thread/unthread left underwear leg, Pull underwear up/down Pants- Performed by  patient: Thread/unthread right pants leg, Thread/unthread left pants leg, Pull pants up/down Pants- Performed by helper: Thread/unthread right pants leg, Thread/unthread left pants leg, Pull pants up/down, Fasten/unfasten pants Non-skid slipper socks- Performed by patient: Don/doff right sock, Don/doff left sock Non-skid slipper socks- Performed by helper: Don/doff right sock, Don/doff left sock TED Hose - Performed by helper: Don/doff right TED hose, Don/doff left TED hose Assist for footwear: Setup Assist for lower body dressing: Supervision or verbal cues  Function - Toileting Toileting steps completed by patient: Adjust clothing prior to toileting, Performs perineal hygiene, Adjust clothing after toileting Toileting Assistive Devices: Grab bar or rail Assist level: Supervision or verbal cues  Function - Air cabin crew transfer assistive device: Grab bar, Walker Assist level to toilet: Supervision or verbal cues Assist level from toilet: Supervision or verbal cues Assist level to bedside commode (at bedside): Touching or steadying assistance (Pt > 75%) (per Benjamin Stain, NT) Assist level from bedside commode (at bedside): Touching or steadying assistance (Pt > 75%)  Function - Chair/bed transfer Chair/bed transfer method: Stand pivot Chair/bed transfer assist level: Supervision or verbal cues Chair/bed transfer assistive device: Walker Chair/bed transfer details: Verbal cues for safe use of DME/AE, Verbal cues for technique  Function - Locomotion: Wheelchair Will patient use wheelchair at discharge?: No Type: Manual Max wheelchair distance: 79f  Assist Level: Touching or steadying assistance (Pt > 75%) Assist Level: Touching or steadying assistance (Pt > 75%) Wheel 150 feet activity did not occur: Safety/medical concerns Turns around,maneuvers to table,bed, and toilet,negotiates 3% grade,maneuvers on rugs and over doorsills: No Function - Locomotion:  Ambulation Assistive device: Walker-rolling Max distance: 1611f Assist level: Supervision or verbal cues Assist level: Supervision or verbal cues Assist level: Supervision or verbal cues Walk 150 feet activity did not occur: Refused Assist level: Supervision or verbal cues Assist level: Touching or steadying assistance (Pt > 75%)  Function - Comprehension Comprehension: Auditory Comprehension assist level: Follows basic conversation/direction with no assist  Function - Expression Expression: Verbal Expression assist level: Expresses basic needs/ideas: With no assist  Function - Social Interaction Social Interaction assist level: Interacts appropriately 90% of the time - Needs monitoring or encouragement for participation or interaction.  Function - Problem Solving Problem solving assist level: Solves basic 90% of the time/requires cueing < 10% of the time  Function - Memory Memory assist level: Recognizes or recalls 75 - 89% of the time/requires cueing 10 - 24% of the time Patient normally able to recall (first 3 days only): Current season, Location of own room, Staff names and faces, That he or she is in a hospital  Medical Problem List and Plan: 1.  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 Female family member states that that will be available Continue CIR , PT, OT, speech,  2.  DVT Prophylaxis/Anticoagulation: Subcutaneous Lovenox. Monitor  for any bleeding episodes 3. Pain Management/headaches: Fioricet as needed 4. Dysphagia. Dysphagia #1 Honey thick liquid diet. Monitor hydration. Follow-up speech therapy, 5. Neuropsych: This patient is capable of making decisions on her own behalf. 6. Skin/Wound Care: Routine skin checks 7. Fluids/Electrolytes/Nutrition: Routine I&O with follow-up chemistries,  intake of fluids , improving, DC IV  8. Diabetes mellitus peripheral neuropathy. Hemoglobin A1c 6.1.SSI. Check blood sugars before meals and at bedtime. Patient on Glucophage 500 mg twice a day prior to admission, no need to resume at the current time CBG (last 3)   Recent Labs  10/25/16 1647 10/25/16 2102 10/26/16 0637  GLUCAP 110* 105* 93    9. Atrial fibrillation. Patient on aspirin 81 mg daily prior to admission and monitor closely for history of falls with SDH. Cardiac rate controlled. Await possible plan to begin Eliquis 5 mg twice a day 14 days after stroke Vitals:   10/26/16 2050 10/27/16 0450  BP: (!) 98/54 123/72  Pulse: 74 82  Resp:  17  Temp:  97.6 F (36.4 C)  Rate  Controlled with Lopressor Per daughter, patient had prior history of small subdural hematoma after a fall in 2015. This was why she was taken off anticoagulation. CVA on 12/30, possibly start DAOC 2 wks post , daughter spoke to PCP who is in favor in waiting for 3 weeks with CT scan performed prior to reinitiation 10.  Hypoalb pro-stat, twice a day 11.Urinary frequency. Check UA LOS (Days) 8 A FACE TO FACE EVALUATION WAS PERFORMED  Paulanthony Gleaves E 10/27/2016, 9:01 AM

## 2016-10-27 NOTE — Progress Notes (Signed)
Occupational Therapy Session Note  Patient Details  Name: [name] Regula MRN: [id number] Date of Birth: 05/09/1941  Today's Date: 10/27/2016 OT Individual Time: 0902-1001 OT Individual Time Calculation (min): 59 min     Short Term Goals: Week 1:  OT Short Term Goal 1 (Week 1): LTGs equal to STGS set at overall supervision to modified independent level.   Skilled Therapeutic Interventions/Progress Updates:    Pt completed toileting to start session with close supervision using the RW.  Therapist took her down to the ADL apartment via wheelchair for next part of session.  She ambulated to the tub/shower room for practice with tub transfers.  She was able to step over the edge of the tub with min guard assist as well as use the tub bench with close supervision.  Mod demonstrational cueing needed for sequencing both types of transfers.  Had her ambulate in the kitchen next and move/place items back using her walker and countertops.  Mod demonstrational cueing with supervision to complete this using the RW correctly.  Finished session with transition to the therapy gym for use of the UE ergonometer.  She was able to complete 3 sets of 3 mins with rest breaks of 1-2 mins in-between.  First set completed with use of BUEs while the last 2 were performed with the LUE isolated.  She was able to complete first set on level 5 resistance as well as the last set.  The middle set was with only level one resistance since it was the first one isolating just the LUE.  Returned to room at end of session where pt was left in the bathroom for toileting with NT present.  Interpreter used throughout session.    Therapy Documentation Precautions:  Precautions Precautions: Fall Restrictions Weight Bearing Restrictions: No  Pain: Pain Assessment Pain Assessment: No/denies pain ADL:  See Function Navigator for Current Functional Status.   Therapy/Group: Individual Therapy  Traycen Goyer OTR/L 10/27/2016, 12:31  PM

## 2016-10-27 NOTE — Progress Notes (Signed)
Physical Therapy Weekly Progress Note  Patient Details  Name: [name] MRN: [id number] Date of Birth: 1941-08-06  Beginning of progress report period: October 20, 2016 End of progress report period: October 27, 2016  Today's Date: 10/27/2016 PT Individual Time: 1517-6160 PT Individual Time Calculation (min): 69 min    Patient has met 1 of 1 short term goals.  STG = LTG due to ELOS. Stay increased due to continued safety issues from impulsivity and mild LLE neglect with gait causing constant foot drag.   Patient continues to demonstrate the following deficits muscle weakness, decreased coordination and decreased standing balance, hemiplegia and decreased balance strategies safety awareness and and therefore will continue to benefit from skilled PT intervention to increase functional independence with mobility.  Patient progressing toward long term goals..  Continue plan of care.  PT Short Term Goals Week 2:  PT Short Term Goal 1 (Week 2): STG = LTG due to ELOS   Skilled Therapeutic Interventions/Progress Updates:   Pt received sitting in WC and agreeable to PT  Gait training 2x 277f with supervision assist and RW. Gait while holding ball without AD x 75 ft with steady assist and tossing ball without AD x 1065fwith min assist. One near LOB to R, but patient able to correct without increased assist from PT. Throughout gait training, PT was required to provide min cues for increased heel contact/step height as well as decreased speed to prevent shuffle/festinating gait and improve safety.   Bed mobility including sit<>supine without assist form PT, supine Prone with min assist and prone to qped with min assist. PT provided min cues for technique to improve success   Supine therex for imrpoved LLE activtion SAQ with 3# ankle weight x 10 BLE Bridges. X 10  SLR  With 3# ankle weight. x 10 Clam shell with level 2 tband x10  Isometric hip adduction x 12  Qped  shoudler extension x10  BUE Hip extenison x6 BLE  Min assist from PT to stabilize pelvis.   Throughout treatment PT provided min-mod cues for decreased speed of movement, increased ROM, to improved muscle timing and strengthening aspects of movement.   Patient returned too room and left supine in bed with call bell in reach and all needs met.         Therapy Documentation Precautions:  Precautions Precautions: Fall Restrictions Weight Bearing Restrictions: No General:   Vital Signs: Therapy Vitals Temp: 97.6 F (36.4 C) Temp Source: Oral Pulse Rate: 82 Resp: 17 BP: 123/72 Patient Position (if appropriate): Lying Oxygen Therapy SpO2: 98 % O2 Device: Not Delivered Pain: 0/10   See Function Navigator for Current Functional Status.  Therapy/Group: Individual Therapy  AuLorie Phenix/08/2017, 7:54 AM

## 2016-10-28 ENCOUNTER — Inpatient Hospital Stay (HOSPITAL_COMMUNITY): Payer: Medicare Other

## 2016-10-28 ENCOUNTER — Inpatient Hospital Stay (HOSPITAL_COMMUNITY): Payer: Medicare Other | Admitting: Physical Therapy

## 2016-10-28 ENCOUNTER — Inpatient Hospital Stay (HOSPITAL_COMMUNITY): Payer: Medicare Other | Admitting: Occupational Therapy

## 2016-10-28 ENCOUNTER — Inpatient Hospital Stay (HOSPITAL_COMMUNITY): Payer: Medicare Other | Admitting: Speech Pathology

## 2016-10-28 LAB — CBC WITH DIFFERENTIAL/PLATELET
BASOS PCT: 1 %
Basophils Absolute: 0.1 10*3/uL (ref 0.0–0.1)
EOS ABS: 0.1 10*3/uL (ref 0.0–0.7)
Eosinophils Relative: 2 %
HEMATOCRIT: 37.9 % (ref 36.0–46.0)
HEMOGLOBIN: 12.4 g/dL (ref 12.0–15.0)
Lymphocytes Relative: 31 %
Lymphs Abs: 1.6 10*3/uL (ref 0.7–4.0)
MCH: 30.8 pg (ref 26.0–34.0)
MCHC: 32.7 g/dL (ref 30.0–36.0)
MCV: 94 fL (ref 78.0–100.0)
Monocytes Absolute: 0.7 10*3/uL (ref 0.1–1.0)
Monocytes Relative: 13 %
NEUTROS ABS: 2.8 10*3/uL (ref 1.7–7.7)
NEUTROS PCT: 53 %
Platelets: 427 10*3/uL — ABNORMAL HIGH (ref 150–400)
RBC: 4.03 MIL/uL (ref 3.87–5.11)
RDW: 12.3 % (ref 11.5–15.5)
WBC: 5.2 10*3/uL (ref 4.0–10.5)

## 2016-10-28 LAB — BASIC METABOLIC PANEL
ANION GAP: 10 (ref 5–15)
BUN: 16 mg/dL (ref 6–20)
CALCIUM: 9.2 mg/dL (ref 8.9–10.3)
CHLORIDE: 107 mmol/L (ref 101–111)
CO2: 24 mmol/L (ref 22–32)
CREATININE: 0.57 mg/dL (ref 0.44–1.00)
GFR calc non Af Amer: >60 mL/min (ref >60)
Glucose, Bld: 97 mg/dL (ref 65–99)
Potassium: 3.9 mmol/L (ref 3.5–5.1)
SODIUM: 141 mmol/L (ref 135–145)

## 2016-10-28 LAB — CK TOTAL AND CKMB (NOT AT ARMC)
CK TOTAL: 18 U/L — AB (ref 38–234)
CK, MB: 0.6 ng/mL (ref 0.5–5.0)
Relative Index: INVALID (ref 0.0–2.5)

## 2016-10-28 LAB — GLUCOSE, CAPILLARY
GLUCOSE-CAPILLARY: 134 mg/dL — AB (ref 65–99)
GLUCOSE-CAPILLARY: 127 mg/dL — AB (ref 65–99)
Glucose-Capillary: 148 mg/dL — ABNORMAL HIGH (ref 65–99)
Glucose-Capillary: 91 mg/dL (ref 65–99)

## 2016-10-28 LAB — TROPONIN I: Troponin I: <0.03 ng/mL (ref <0.03)

## 2016-10-28 NOTE — Progress Notes (Signed)
Physical Therapy Session Note  Patient Details  Name: Darlina RumpfHue Iden MRN: [id number] Date of Birth: 03/14/1941  Today's Date: 10/28/2016 PT Individual Time: 1445-1615   PT Individual Time Calculation: 90 min    Short Term Goals: Week 2:  PT Short Term Goal 1 (Week 2): STG = LTG due to ELOS   Skilled Therapeutic Interventions/Progress Updates:     Pt received supine in bed and agreeable to PT. Supine>sit transfer without assist or cues    Gait through hall in hospital x 24500ft. With RW and supervision assist patient to ambulate 14225ft prior to additional cues to recuced cadence  Hip abduction strengthening with lateral step ups to 4 inch step x 1 BLE and BUE support on parallel bars.    sitting on therapy ball: Pelvic stability, improved core training and balance quiet sitting 2x 2 minutes. Reciprocal marching 2x 10 and reciprocol knee extension.  Moderate cues for improved pelvic alignment, improved core activation and improved weight shift to the L  PT instructed patient in [place] Level A HEP with hand out provided  Min assist for standing exercises due to L lateral lean with min cues for proper ROM and decreased compensation.   PT instructed patient in stair negotiation with 2 progressing to 1 UE support with close superivsion assist and min cues for proper step to gait pattern  Nustep endurance training x 8 minutes with 1 short rest break. Min cues for maintain SPM >40; level 2>5   Throughout treatment patient performed sit<>stand with supervision assist x 15 and use of RW with min cues for safety.   Patient returned to room and left sitting EOB with call bell in reach.     Therapy Documentation Precautions:  Precautions Precautions: Fall Restrictions Weight Bearing Restrictions: No General:   Vital Signs: Therapy Vitals Temp: 97.8 F (36.6 C) Temp Source: Oral Pulse Rate: 85 Resp: 18 BP: 113/70 Patient Position (if appropriate): Lying Oxygen Therapy SpO2: 100 % O2  Device: Not Delivered Pain: 0/10   See Function Navigator for Current Functional Status.   Therapy/Group: Individual Therapy  Golden Popustin E Brian Kocourek 10/28/2016, 7:54 AM

## 2016-10-28 NOTE — Progress Notes (Signed)
Occupational Therapy Weekly Progress Note  Patient Details  Name: [name] MRN: [id number] Date of Birth: 05/20/1941  Beginning of progress report period: October 19, 2016 End of progress report period: October 28, 2016  Today's Date: 10/28/2016 OT Individual Time: 0903-1000 OT Individual Time Calculation (min): 57 min     Pt is currently at a supervision to min guard assist level for all selfcare tasks and functional transfers with use of the RW for support.  She continues to need mod instructional cueing for using the RW correctly during transitional movements as she tends to want to put her hands on the walker at times when transitioning from sit to stand and stand to sit.  She also tends to pick the walker all the way up off of the ground with sharp turns as well as step outside if it.  Slight decreased gross motor coordination noted in the left hand but uses it functionally at a diminished level.  Therapy putty has been issued as well as handout for left hand strengthening and coordination.  Feel pt is making steady progress toward supervision/modified independent level goals.  Will continue to follow with expected discharge 11/01/2016. Patient continues to demonstrate the following deficits: muscle weakness, decreased coordination and decreased standing balance and decreased balance strategies and therefore will continue to benefit from skilled OT intervention to enhance overall performance with BADL and Reduce care partner burden.  Patient progressing toward long term goals..  Continue plan of care.  OT Short Term Goals Week 2:  OT Short Term Goal 1 (Week 2): LTGs equal to STGS set at overall supervision to modified independent level.    Skilled Therapeutic Interventions/Progress Updates:    Pt completed ADL with daughter present for session and for translation.  Discussed with daughter DME needs of tub bench and 3:1, which she is in agreement with as well.  Ms. Audley HoseHong was able to gather  her clothing with use of the RW for support and transfer to the tub bench.  Mod instructional cueing to stay inside the walker when turning around to the seat as she wanted to push the walker away and then step away from it.  Close supervision for bathing with education completed with daughter so she is OK to assist with this over the weekend.  She completed dressing sit to stand from the shower seat with supervision as well.  Therapist assisted with donning TEDS.  Pt then ambulated to the day room for work on LUE coordination.  Had pt sit on mat and work on catching and tossing the beach ball with 70% accuracy.  Had her stand as well with min guard assist while daughter tossed it to her.  Ambulated back to the room with supervision and use of the RW.  Pt left in bed with family present and nursing in room.   Therapy Documentation Precautions:  Precautions Precautions: Fall Restrictions Weight Bearing Restrictions: No  Pain: Pain Assessment Pain Assessment: No/denies pain ADL: See Function Navigator for Current Functional Status.   Therapy/Group: Individual Therapy  Stanley Helmuth OTR/L 10/28/2016, 12:18 PM

## 2016-10-28 NOTE — Progress Notes (Signed)
All labs reviewed and within normal limits. Pt has had a reasonable afternoon. CT without hemorrhage. Neurology to follow up over weekend re: anticoagulation.  Ranelle OysterZachary T. Swartz, MD, [facility]AP[facility] 10/28/2016

## 2016-10-28 NOTE — Progress Notes (Signed)
Subjective/Complaints:  Complains of SOB, more palpitations overnight. No chest pain. Legs still tingling  ROS: pt denies nausea, vomiting, diarrhea, has occasional cough, non-productive    Review systems: No problems with breathing. No problems with bowels, urinary frequency, no burning Objective: Vital Signs: Blood pressure 113/70, pulse 85, temperature 97.8 F (36.6 C), temperature source Oral, resp. rate 18, height _0  (1.473 m), weight 60.1 kg (132 lb 8 oz), SpO2 100 %. No results found. Results for orders placed or performed during the hospital encounter of 10/19/16 (from the past 72 hour(s))  Glucose, capillary     Status: Abnormal   Collection Time: 10/25/16 11:31 AM  Result Value Ref Range   Glucose-Capillary 113 (H) 65 - 99 mg/dL  Basic metabolic panel     Status: Abnormal   Collection Time: 10/25/16 11:32 AM  Result Value Ref Range   Sodium 141 135 - 145 mmol/L   Potassium 3.8 3.5 - 5.1 mmol/L   Chloride 104 101 - 111 mmol/L   CO2 27 22 - 32 mmol/L   Glucose, Bld 111 (H) 65 - 99 mg/dL   BUN 12 6 - 20 mg/dL   Creatinine, Ser 0.57 0.44 - 1.00 mg/dL   Calcium 9.3 8.9 - 10.3 mg/dL   GFR calc non Af Amer >60 >60 mL/min   GFR calc Af Amer >60 >60 mL/min    Comment: (NOTE) The eGFR has been calculated using the CKD EPI equation. This calculation has not been validated in all clinical situations. eGFR's persistently <60 mL/min signify possible Chronic Kidney Disease.    Anion gap 10 5 - 15  Glucose, capillary     Status: Abnormal   Collection Time: 10/25/16  4:47 PM  Result Value Ref Range   Glucose-Capillary 110 (H) 65 - 99 mg/dL  Glucose, capillary     Status: Abnormal   Collection Time: 10/25/16  9:02 PM  Result Value Ref Range   Glucose-Capillary 105 (H) 65 - 99 mg/dL  Glucose, capillary     Status: None   Collection Time: 10/26/16  6:37 AM  Result Value Ref Range   Glucose-Capillary 93 65 - 99 mg/dL  Urinalysis, Routine w reflex microscopic     Status:  Abnormal   Collection Time: 10/26/16 11:04 AM  Result Value Ref Range   Color, Urine YELLOW YELLOW   APPearance CLEAR CLEAR   Specific Gravity, Urine 1.013 1.005 - 1.030   pH 7.0 5.0 - 8.0   Glucose, UA NEGATIVE NEGATIVE mg/dL   Hgb urine dipstick NEGATIVE NEGATIVE   Bilirubin Urine NEGATIVE NEGATIVE   Ketones, ur NEGATIVE NEGATIVE mg/dL   Protein, ur NEGATIVE NEGATIVE mg/dL   Nitrite NEGATIVE NEGATIVE   Leukocytes, UA TRACE (A) NEGATIVE   RBC / HPF 0-5 0 - 5 RBC/hpf   WBC, UA 6-30 0 - 5 WBC/hpf   Bacteria, UA NONE SEEN NONE SEEN   Squamous Epithelial / LPF 0-5 (A) NONE SEEN  Urine culture     Status: Abnormal   Collection Time: 10/26/16 11:04 AM  Result Value Ref Range   Specimen Description URINE, RANDOM    Special Requests NONE    Culture 10,000 COLONIES/mL VIRIDANS STREPTOCOCCUS (A)    Report Status 10/27/2016 FINAL   Glucose, capillary     Status: Abnormal   Collection Time: 10/26/16 12:02 PM  Result Value Ref Range   Glucose-Capillary 110 (H) 65 - 99 mg/dL  Creatinine, serum     Status: None   Collection Time: 10/26/16 12:16  PM  Result Value Ref Range   Creatinine, Ser 0.66 0.44 - 1.00 mg/dL   GFR calc non Af Amer >60 >60 mL/min   GFR calc Af Amer >60 >60 mL/min    Comment: (NOTE) The eGFR has been calculated using the CKD EPI equation. This calculation has not been validated in all clinical situations. eGFR's persistently <60 mL/min signify possible Chronic Kidney Disease.   Glucose, capillary     Status: Abnormal   Collection Time: 10/26/16  5:06 PM  Result Value Ref Range   Glucose-Capillary 114 (H) 65 - 99 mg/dL  Glucose, capillary     Status: Abnormal   Collection Time: 10/26/16  9:24 PM  Result Value Ref Range   Glucose-Capillary 154 (H) 65 - 99 mg/dL  Glucose, capillary     Status: Abnormal   Collection Time: 10/27/16  6:54 AM  Result Value Ref Range   Glucose-Capillary 115 (H) 65 - 99 mg/dL  Glucose, capillary     Status: Abnormal   Collection Time:  10/27/16 11:53 AM  Result Value Ref Range   Glucose-Capillary 106 (H) 65 - 99 mg/dL     HEENT: normal, Right TM is clear, right external canal is clear Cardio: IRR IRR  Around 90, no murmurs Resp: CTA B/L. unlabor GI: BS positive and Nontender, nondistended Extremity:  No Edema Skin:   Intact Neuro: Alert/Oriented, Normal Sensory , light touch and proprioception in upper and lower limbs although she complains of tingling in her legs.. Abnormal Motor 4- Left delt Bi Tri Grip, 4+ Left HF, KE, 3- L ankle DF, Abnormal FMC Ataxic/ dec FMC. Tone normal.  Musc/Skel:  Other ful ROM in UEs, no pain with LE AROM Gen NAD   Assessment/Plan: 1. 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 Physiatrist is providing close team supervision and 24 hour management of active medical problems listed below. Physiatrist and rehab team continue to assess barriers to discharge/monitor patient progress toward functional and medical goals. FIM: Function - Bathing Position: Shower Body parts bathed by patient: Right arm, Left arm, Chest, Abdomen, Front perineal area, Right upper leg, Left upper leg, Right lower leg, Left lower leg, Buttocks Body parts bathed by helper: Back Assist Level: Touching or steadying assistance(Pt > 75%)  Function- Upper Body Dressing/Undressing What is the patient wearing?: Pull over shirt/dress Bra - Perfomed by patient: Thread/unthread right bra strap, Thread/unthread left bra strap, Hook/unhook bra (pull down sports bra) Bra - Perfomed by helper: Hook/unhook bra (pull down sports bra) Pull over shirt/dress - Perfomed by patient: Thread/unthread right sleeve, Thread/unthread left sleeve, Put head through opening, Pull shirt over trunk Pull over shirt/dress - Perfomed by helper: Thread/unthread right sleeve, Thread/unthread left sleeve, Put head  through opening, Pull shirt over trunk Button up shirt - Perfomed by patient: Thread/unthread right sleeve, Button/unbutton shirt Button up shirt - Perfomed by helper: Thread/unthread left sleeve, Pull shirt around back Assist Level: Supervision or verbal cues Function - Lower Body Dressing/Undressing What is the patient wearing?: Underwear, Non-skid slipper socks, Pants, Ted Hose Position: Other (comment) (sitting on tub bench) Underwear - Performed by patient: Thread/unthread right underwear leg, Thread/unthread left underwear leg, Pull underwear up/down Underwear - Performed by helper: Thread/unthread right underwear leg, Thread/unthread left underwear leg, Pull underwear up/down Pants- Performed by patient: Thread/unthread right pants leg, Thread/unthread left pants leg, Pull pants up/down Pants- Performed by helper: Thread/unthread right  pants leg, Thread/unthread left pants leg, Pull pants up/down, Fasten/unfasten pants Non-skid slipper socks- Performed by patient: Don/doff right sock, Don/doff left sock Non-skid slipper socks- Performed by helper: Don/doff right sock, Don/doff left sock TED Hose - Performed by helper: Don/doff right TED hose, Don/doff left TED hose Assist for footwear: Setup Assist for lower body dressing: Supervision or verbal cues  Function - Toileting Toileting steps completed by patient: Adjust clothing prior to toileting, Performs perineal hygiene, Adjust clothing after toileting Toileting Assistive Devices: Grab bar or rail Assist level: Supervision or verbal cues  Function - Air cabin crew transfer assistive device: Grab bar, Walker Assist level to toilet: Supervision or verbal cues Assist level from toilet: Supervision or verbal cues Assist level to bedside commode (at bedside): Touching or steadying assistance (Pt > 75%) (per Benjamin Stain, NT) Assist level from bedside commode (at bedside): Touching or steadying assistance (Pt > 75%)  Function -  Chair/bed transfer Chair/bed transfer method: Stand pivot Chair/bed transfer assist level: Supervision or verbal cues Chair/bed transfer assistive device: Walker Chair/bed transfer details: Verbal cues for safe use of DME/AE, Verbal cues for technique  Function - Locomotion: Wheelchair Will patient use wheelchair at discharge?: No Type: Manual Max wheelchair distance: 55f  Assist Level: Touching or steadying assistance (Pt > 75%) Assist Level: Touching or steadying assistance (Pt > 75%) Wheel 150 feet activity did not occur: Safety/medical concerns Turns around,maneuvers to table,bed, and toilet,negotiates 3% grade,maneuvers on rugs and over doorsills: No Function - Locomotion: Ambulation Assistive device: Walker-rolling Max distance: 200 Assist level: Supervision or verbal cues Assist level: Supervision or verbal cues Assist level: Supervision or verbal cues Walk 150 feet activity did not occur: Refused Assist level: Supervision or verbal cues Assist level: Touching or steadying assistance (Pt > 75%)  Function - Comprehension Comprehension: Auditory Comprehension assist level: Follows basic conversation/direction with no assist  Function - Expression Expression: Verbal Expression assist level: Expresses basic needs/ideas: With no assist  Function - Social Interaction Social Interaction assist level: Interacts appropriately 90% of the time - Needs monitoring or encouragement for participation or interaction.  Function - Problem Solving Problem solving assist level: Solves basic 75 - 89% of the time/requires cueing 10 - 24% of the time  Function - Memory Memory assist level: Recognizes or recalls 75 - 89% of the time/requires cueing 10 - 24% of the time Patient normally able to recall (first 3 days only): Current season, Location of own room, Staff names and faces, That he or she is in a hospital  Medical Problem List and Plan: 1.  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  DVT Prophylaxis/Anticoagulation: Subcutaneous Lovenox. Monitor for any bleeding episodes 3. Pain Management/headaches: Fioricet as needed 4. Dysphagia. Dysphagia #3 nectar thick liquid diet. Monitor hydration. Follow-up speech therapy, 5. Neuropsych: This patient is capable of making decisions on her own behalf. 6. Skin/Wound Care: Routine skin checks 7. Fluids/Electrolytes/Nutrition: Routine I&O with follow-up chemistries, intake of fluids , improving, DC IV   8. Diabetes mellitus peripheral neuropathy. Hemoglobin A1c 6.1.SSI. Check blood sugars before meals and at bedtime. Patient on Glucophage 500 mg twice a day prior to admission, no need to resume at the current time CBG (last 3)   Recent Labs  10/26/16 2124 10/27/16 0654 10/27/16 1153  GLUCAP 154* 115* 106*    9. Atrial fibrillation. Patient on aspirin  81 mg daily prior to admission and monitor closely for history of falls with SDH. Cardiac rate controlled.   Vitals:   10/27/16 2059 10/28/16 0413  BP: (!) 99/59 113/70  Pulse: 67 85  Resp:  18  Temp:  97.8 F (36.6 C)  Rate  Controlled with Lopressor in 80's to 90's but more ectopy today  -check EKG  -labs/cardiac panel today Per daughter, patient had prior history of small subdural hematoma after a fall in 2015. This was why she was taken off anticoagulation. CVA on 12/30. Daughter concerned about resuming anticoagulation prior to re-checking CT. Will go ahead and order follow up CT head today. Have spoken to neurology who will follow up scan over the weekend and counsel pt/family/potentially resume eliquis if scan is stable  10.  Hypoalb pro-stat, twice a day 11.Urinary frequency. UCX negative    A FACE TO FACE EVALUATION WAS PERFORMED  SWARTZ,ZACHARY T 10/28/2016, 8:38 AM

## 2016-10-28 NOTE — Progress Notes (Signed)
Speech Language Pathology Daily Session Note  Patient Details  Name: [name] MRN: [id number] Date of Birth: 08/06/1941  Today's Date: 10/28/2016 SLP Individual Time: 0800-0900 SLP Individual Time Calculation (min): 60 min   Short Term Goals: Week 2: SLP Short Term Goal 1 (Week 2): Continue working towards long term goals which are now upgraded to mod I.   Skilled Therapeutic Interventions:  Pt was seen for skilled ST targeting cognitive and dysphagia goals.  Pt had already eaten breakfast upon therapist's arrival and was completing oral care while standing sinkside with assistance from nurse tech.  SLP facilitated the session with trials of thin liquids via cup sips to continue working towards diet progression.  Pt demonstrated delayed throat clear x2 with cup sips of water which appeared to be related to delay in swallow initiation.  Pt was also able to return demonstration of pharyngeal strengthening exercises for 5x5 repetitions each with mod I.  SLP also facilitated the session with a scavenger hunt to address safety awareness during mobility. Pt was able to verbalize steps for safe walker use with supervision question cues but needed min assist to use safe walker strategies in the moment during task.  Pt needed supervision verbal cues for route recall to return to room.  Pt was left sitting at edge of bed with daughter at bedside.  Continue per current plan of care.    Function:  Eating Eating   Modified Consistency Diet: Yes Eating Assist Level: Set up assist for   Eating Set Up Assist For: Opening containers       Cognition Comprehension Comprehension assist level: Follows basic conversation/direction with no assist  Expression   Expression assist level: Expresses basic needs/ideas: With no assist  Social Interaction Social Interaction assist level: Interacts appropriately 90% of the time - Needs monitoring or encouragement for participation or interaction.  Problem Solving  Problem solving assist level: Solves basic 75 - 89% of the time/requires cueing 10 - 24% of the time  Memory Memory assist level: Recognizes or recalls 90% of the time/requires cueing < 10% of the time    Pain Pain Assessment Pain Assessment: No/denies pain  Therapy/Group: Individual Therapy  Palmira Stickle, Melanee SpryNicole L 10/28/2016, 12:18 PM

## 2016-10-28 NOTE — Plan of Care (Signed)
Problem: RH Ambulation Goal: LTG Patient will ambulate in controlled environment (PT) LTG: Patient will ambulate in a controlled environment, # of feet with assistance (PT).  Down graded due to continued safety concerns.  Goal: LTG Patient will ambulate in home environment (PT) LTG: Patient will ambulate in home environment, # of feet with assistance (PT).  Down graded due to continued safety concerns.   Problem: RH Wheelchair Mobility Goal: LTG Patient will propel w/c in controlled environment (PT) LTG: Patient will propel wheelchair in controlled environment, # of feet with assist (PT)  Outcome: Completed/Met Date Met: 10/28/16 d/c goal due to improved ambulatory status

## 2016-10-29 ENCOUNTER — Ambulatory Visit (HOSPITAL_COMMUNITY): Payer: Medicare Other | Admitting: Speech Pathology

## 2016-10-29 ENCOUNTER — Inpatient Hospital Stay (HOSPITAL_COMMUNITY): Payer: Medicare Other | Admitting: Occupational Therapy

## 2016-10-29 DIAGNOSIS — E1149 Type 2 diabetes mellitus with other diabetic neurological complication: Secondary | ICD-10-CM

## 2016-10-29 DIAGNOSIS — I69991 Dysphagia following unspecified cerebrovascular disease: Secondary | ICD-10-CM

## 2016-10-29 DIAGNOSIS — I6350 Cerebral infarction due to unspecified occlusion or stenosis of unspecified cerebral artery: Secondary | ICD-10-CM

## 2016-10-29 DIAGNOSIS — R059 Cough, unspecified: Secondary | ICD-10-CM

## 2016-10-29 DIAGNOSIS — S065X9A Traumatic subdural hemorrhage with loss of consciousness of unspecified duration, initial encounter: Secondary | ICD-10-CM

## 2016-10-29 DIAGNOSIS — I69998 Other sequelae following unspecified cerebrovascular disease: Secondary | ICD-10-CM

## 2016-10-29 DIAGNOSIS — R05 Cough: Secondary | ICD-10-CM

## 2016-10-29 DIAGNOSIS — E1142 Type 2 diabetes mellitus with diabetic polyneuropathy: Secondary | ICD-10-CM

## 2016-10-29 DIAGNOSIS — S065XAA Traumatic subdural hemorrhage with loss of consciousness status unknown, initial encounter: Secondary | ICD-10-CM

## 2016-10-29 DIAGNOSIS — I69959 Hemiplegia and hemiparesis following unspecified cerebrovascular disease affecting unspecified side: Secondary | ICD-10-CM

## 2016-10-29 DIAGNOSIS — I69391 Dysphagia following cerebral infarction: Secondary | ICD-10-CM

## 2016-10-29 DIAGNOSIS — I4891 Unspecified atrial fibrillation: Secondary | ICD-10-CM

## 2016-10-29 DIAGNOSIS — I6200 Nontraumatic subdural hemorrhage, unspecified: Secondary | ICD-10-CM

## 2016-10-29 DIAGNOSIS — R350 Frequency of micturition: Secondary | ICD-10-CM

## 2016-10-29 MED ORDER — GUAIFENESIN-DM 100-10 MG/5ML PO SYRP
5.0000 mL | ORAL_SOLUTION | Freq: Four times a day (QID) | ORAL | Status: DC | PRN
  Administered 2016-10-29 – 2016-10-31 (×2): 5 mL via ORAL
  Filled 2016-10-29 (×3): qty 5

## 2016-10-29 MED ORDER — DARIFENACIN HYDROBROMIDE ER 7.5 MG PO TB24
7.5000 mg | ORAL_TABLET | Freq: Every day | ORAL | Status: DC
  Administered 2016-10-29: 7.5 mg via ORAL
  Filled 2016-10-29 (×2): qty 1

## 2016-10-29 NOTE — Progress Notes (Signed)
Occupational Therapy Session Note  Patient Details  Name: [name] MRN: [id number] Date of Birth: 07/15/1941  Today's Date: 10/29/2016 OT Individual Time:  - 6962-9528- 1046-1133 Individual Treatment Time Calculation: 47 minutes     Short Term Goals: Week 1:  OT Short Term Goal 1 (Week 1): LTGs equal to STGS set at overall supervision to modified independent level.  Week 2:  OT Short Term Goal 1 (Week 2): LTGs equal to STGS set at overall supervision to modified independent level.      Skilled Therapeutic Interventions/Progress Updates:   Pt was lying in bed with daughter [name] present at time of arrival. [name] requested to complete kitchen mobility this session. Pt ambulated to therapy apartment with RW and overall Min A. Once in apartment, therapist-pt-daughter collaboration completed regarding home kitchen modifications to maximize pt independence as well as safety. Pt practiced sidestepping with RW while transporting kitchen items, wiping down countertops, and washing dishes (emphasis on using L UE). Max cues for L foot clearance due to drag and tendency to take "small steps." Mod cues for walker safety with teach back method utilized for carryover of education. Education also provided to pt and daughter regarding safe meal prep/cleaning tasks that she can complete at home. Emphasis on having daughter do all chopping/peeling tasks.They verbalized understanding. Walker Systems developerbag issued. Pt then ambulated back to room and was left with daughter at time of departure.    Therapy Documentation Precautions:  Precautions Precautions: Fall Restrictions Weight Bearing Restrictions: No :   Vital Signs: Therapy Vitals Temp: 98 F (36.7 C) Temp Source: Oral Pulse Rate: 76 Resp: 18 BP: 106/62 Patient Position (if appropriate): Lying Oxygen Therapy SpO2: 97 % O2 Device: Not Delivered Pain: No c/o pain during session    ADL:  :    See Function Navigator for Current Functional Status.   Therapy/Group:  Individual Therapy  Tiberius Loftus A Festus Pursel 10/29/2016, 4:44 PM

## 2016-10-29 NOTE — Progress Notes (Signed)
Urinary frequency last night, urine clear, no odor.Alfredo MartinezMurray, Deidra Spease A

## 2016-10-29 NOTE — Progress Notes (Addendum)
Subjective/Complaints: Pt seen laying in bed this AM. Daughter at bedside translates.  Pt slept well overnight, but woke up several times to urinate, confirmed with nursing.  Daughter also states cough overnight and requests decongestant.   ROS: +Cough. Denies CP, SOB, nausea, vomiting, diarrhea.  Objective: Vital Signs: Blood pressure 109/65, pulse 83, temperature 97.8 F (36.6 C), temperature source Oral, resp. rate 16, height _0  (1.473 m), weight 60.1 kg (132 lb 8 oz), SpO2 98 %. Ct Head Wo Contrast  Result Date: 10/28/2016 CLINICAL DATA:  Subdural hematoma follow-up EXAM: CT HEAD WITHOUT CONTRAST TECHNIQUE: Contiguous axial images were obtained from the base of the skull through the vertex without intravenous contrast. COMPARISON:  MRI 10/16/2016.  CT 10/15/2016. FINDINGS: Brain: Physiologic calcifications in the basal ganglia bilaterally. No acute intracranial abnormality. Specifically, no hemorrhage, hydrocephalus, mass lesion, acute infarction, or significant intracranial injury. No visible subdural hematoma. Vascular: No hyperdense vessel or unexpected calcification. Skull: No acute calvarial abnormality. Sinuses/Orbits: Mucosal thickening in the frontal sinuses and anterior left ethmoid air cells. Mastoid air cells are clear. Orbital soft tissues unremarkable. Other: None IMPRESSION: No acute intracranial abnormality. Electronically Signed   By: Rolm Baptise M.D.   On: 10/28/2016 11:49   Results for orders placed or performed during the hospital encounter of 10/19/16 (from the past 72 hour(s))  Urinalysis, Routine w reflex microscopic     Status: Abnormal   Collection Time: 10/26/16 11:04 AM  Result Value Ref Range   Color, Urine YELLOW YELLOW   APPearance CLEAR CLEAR   Specific Gravity, Urine 1.013 1.005 - 1.030   pH 7.0 5.0 - 8.0   Glucose, UA NEGATIVE NEGATIVE mg/dL   Hgb urine dipstick NEGATIVE NEGATIVE   Bilirubin Urine NEGATIVE NEGATIVE   Ketones, ur NEGATIVE NEGATIVE  mg/dL   Protein, ur NEGATIVE NEGATIVE mg/dL   Nitrite NEGATIVE NEGATIVE   Leukocytes, UA TRACE (A) NEGATIVE   RBC / HPF 0-5 0 - 5 RBC/hpf   WBC, UA 6-30 0 - 5 WBC/hpf   Bacteria, UA NONE SEEN NONE SEEN   Squamous Epithelial / LPF 0-5 (A) NONE SEEN  Urine culture     Status: Abnormal   Collection Time: 10/26/16 11:04 AM  Result Value Ref Range   Specimen Description URINE, RANDOM    Special Requests NONE    Culture 10,000 COLONIES/mL VIRIDANS STREPTOCOCCUS (A)    Report Status 10/27/2016 FINAL   Glucose, capillary     Status: Abnormal   Collection Time: 10/26/16 12:02 PM  Result Value Ref Range   Glucose-Capillary 110 (H) 65 - 99 mg/dL  Creatinine, serum     Status: None   Collection Time: 10/26/16 12:16 PM  Result Value Ref Range   Creatinine, Ser 0.66 0.44 - 1.00 mg/dL   GFR calc non Af Amer >60 >60 mL/min   GFR calc Af Amer >60 >60 mL/min    Comment: (NOTE) The eGFR has been calculated using the CKD EPI equation. This calculation has not been validated in all clinical situations. eGFR's persistently <60 mL/min signify possible Chronic Kidney Disease.   Glucose, capillary     Status: Abnormal   Collection Time: 10/26/16  5:06 PM  Result Value Ref Range   Glucose-Capillary 114 (H) 65 - 99 mg/dL  Glucose, capillary     Status: Abnormal   Collection Time: 10/26/16  9:24 PM  Result Value Ref Range   Glucose-Capillary 154 (H) 65 - 99 mg/dL  Glucose, capillary     Status: Abnormal  Collection Time: 10/27/16  6:54 AM  Result Value Ref Range   Glucose-Capillary 115 (H) 65 - 99 mg/dL  Glucose, capillary     Status: Abnormal   Collection Time: 10/27/16 11:53 AM  Result Value Ref Range   Glucose-Capillary 106 (H) 65 - 99 mg/dL  Glucose, capillary     Status: Abnormal   Collection Time: 10/27/16  4:45 PM  Result Value Ref Range   Glucose-Capillary 134 (H) 65 - 99 mg/dL  Glucose, capillary     Status: Abnormal   Collection Time: 10/27/16  9:17 PM  Result Value Ref Range    Glucose-Capillary 148 (H) 65 - 99 mg/dL  Glucose, capillary     Status: Abnormal   Collection Time: 10/28/16  6:40 AM  Result Value Ref Range   Glucose-Capillary 127 (H) 65 - 99 mg/dL  Troponin I     Status: None   Collection Time: 10/28/16 10:32 AM  Result Value Ref Range   Troponin I <0.03 <0.03 ng/mL  CK total and CKMB (cardiac)not at [facility]     Status: Abnormal   Collection Time: 10/28/16 10:32 AM  Result Value Ref Range   Total CK 18 (L) 38 - 234 U/L   CK, MB 0.6 0.5 - 5.0 ng/mL   Relative Index RELATIVE INDEX IS INVALID 0.0 - 2.5    Comment: WHEN CK < 100 U/L          Basic metabolic panel     Status: None   Collection Time: 10/28/16 10:32 AM  Result Value Ref Range   Sodium 141 135 - 145 mmol/L   Potassium 3.9 3.5 - 5.1 mmol/L   Chloride 107 101 - 111 mmol/L   CO2 24 22 - 32 mmol/L   Glucose, Bld 97 65 - 99 mg/dL   BUN 16 6 - 20 mg/dL   Creatinine, Ser 0.57 0.44 - 1.00 mg/dL   Calcium 9.2 8.9 - 10.3 mg/dL   GFR calc non Af Amer >60 >60 mL/min   GFR calc Af Amer >60 >60 mL/min    Comment: (NOTE) The eGFR has been calculated using the CKD EPI equation. This calculation has not been validated in all clinical situations. eGFR's persistently <60 mL/min signify possible Chronic Kidney Disease.    Anion gap 10 5 - 15  CBC with Differential/Platelet     Status: Abnormal   Collection Time: 10/28/16 10:32 AM  Result Value Ref Range   WBC 5.2 4.0 - 10.5 K/uL   RBC 4.03 3.87 - 5.11 MIL/uL   Hemoglobin 12.4 12.0 - 15.0 g/dL   HCT 37.9 36.0 - 46.0 %   MCV 94.0 78.0 - 100.0 fL   MCH 30.8 26.0 - 34.0 pg   MCHC 32.7 30.0 - 36.0 g/dL   RDW 12.3 11.5 - 15.5 %   Platelets 427 (H) 150 - 400 K/uL   Neutrophils Relative % 53 %   Neutro Abs 2.8 1.7 - 7.7 K/uL   Lymphocytes Relative 31 %   Lymphs Abs 1.6 0.7 - 4.0 K/uL   Monocytes Relative 13 %   Monocytes Absolute 0.7 0.1 - 1.0 K/uL   Eosinophils Relative 2 %   Eosinophils Absolute 0.1 0.0 - 0.7 K/uL   Basophils Relative 1 %    Basophils Absolute 0.1 0.0 - 0.1 K/uL  Glucose, capillary     Status: None   Collection Time: 10/28/16 11:24 AM  Result Value Ref Range   Glucose-Capillary 91 65 - 99 mg/dL  HEENT: normocephalic, atraumatic.  Cardio: IRRR, No JVD Resp: CTA B/L. unlabored GI: BS positive and nondistended Skin:   Intact. Warm and dry Neuro: Alert/Oriented,  Motor: 4/5 Left delt Bi Tri, Grip,  4+/5 Left HF, KE, 3/5 L ankle DF Tone normal.  Musc/Skel:  No Edema. No tenderness Gen NAD. Vital signs reviewed   Assessment/Plan: 1. 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 Physiatrist is providing close team supervision and 24 hour management of active medical problems listed below. Physiatrist and rehab team continue to assess barriers to discharge/monitor patient progress toward functional and medical goals. FIM: Function - Bathing Position: Shower Body parts bathed by patient: Right arm, Left arm, Chest, Abdomen, Front perineal area, Right upper leg, Left upper leg, Right lower leg, Left lower leg, Buttocks Body parts bathed by helper: Back Bathing not applicable: Back (Did not attempt) Assist Level: Supervision or verbal cues  Function- Upper Body Dressing/Undressing What is the patient wearing?: Pull over shirt/dress Bra - Perfomed by patient: Thread/unthread right bra strap, Thread/unthread left bra strap, Hook/unhook bra (pull down sports bra) Bra - Perfomed by helper: Hook/unhook bra (pull down sports bra) Pull over shirt/dress - Perfomed by patient: Thread/unthread right sleeve, Thread/unthread left sleeve, Put head through opening, Pull shirt over trunk Pull over shirt/dress - Perfomed by helper: Thread/unthread right sleeve, Thread/unthread left sleeve, Put head through opening, Pull shirt over trunk Button up shirt - Perfomed by patient: Thread/unthread  right sleeve, Button/unbutton shirt Button up shirt - Perfomed by helper: Thread/unthread left sleeve, Pull shirt around back Assist Level: Supervision or verbal cues Function - Lower Body Dressing/Undressing What is the patient wearing?: Underwear, Pants, Non-skid slipper socks, Ted Hose Position: Other (comment) (sitting on tub bench after shower) Underwear - Performed by patient: Thread/unthread right underwear leg, Thread/unthread left underwear leg, Pull underwear up/down Underwear - Performed by helper: Thread/unthread right underwear leg, Thread/unthread left underwear leg, Pull underwear up/down Pants- Performed by patient: Thread/unthread right pants leg, Thread/unthread left pants leg, Pull pants up/down Pants- Performed by helper: Thread/unthread right pants leg, Thread/unthread left pants leg, Pull pants up/down, Fasten/unfasten pants Non-skid slipper socks- Performed by patient: Don/doff right sock, Don/doff left sock Non-skid slipper socks- Performed by helper: Don/doff right sock, Don/doff left sock TED Hose - Performed by helper: Don/doff right TED hose, Don/doff left TED hose Assist for footwear: Setup Assist for lower body dressing: Supervision or verbal cues  Function - Toileting Toileting steps completed by patient: Adjust clothing prior to toileting, Adjust clothing after toileting, Performs perineal hygiene Toileting Assistive Devices: Grab bar or rail Assist level: Supervision or verbal cues  Function - Air cabin crew transfer assistive device: Landscape architect, Walker Assist level to toilet: Supervision or verbal cues Assist level from toilet: Supervision or verbal cues Assist level to bedside commode (at bedside): Touching or steadying assistance (Pt > 75%) (per Benjamin Stain, NT) Assist level from bedside commode (at bedside): Touching or steadying assistance (Pt > 75%)  Function - Chair/bed transfer Chair/bed transfer method: Stand pivot Chair/bed transfer  assist level: Supervision or verbal cues Chair/bed transfer assistive device: Walker Chair/bed transfer details: Verbal cues for safe use of DME/AE, Verbal cues for technique  Function - Locomotion: Wheelchair Will patient use wheelchair at discharge?: No Type: Manual Max wheelchair distance: 17f  Assist Level: Touching or steadying assistance (Pt > 75%) Assist Level: Touching or steadying assistance (Pt >  75%) Wheel 150 feet activity did not occur: Safety/medical concerns Turns around,maneuvers to table,bed, and toilet,negotiates 3% grade,maneuvers on rugs and over doorsills: No Function - Locomotion: Ambulation Assistive device: Walker-rolling Max distance: 200 Assist level: Supervision or verbal cues Assist level: Supervision or verbal cues Assist level: Supervision or verbal cues Walk 150 feet activity did not occur: Refused Assist level: Supervision or verbal cues Assist level: Touching or steadying assistance (Pt > 75%)  Function - Comprehension Comprehension: Auditory Comprehension assist level: Follows basic conversation/direction with no assist  Function - Expression Expression: Verbal Expression assist level: Expresses basic needs/ideas: With no assist  Function - Social Interaction Social Interaction assist level: Interacts appropriately 90% of the time - Needs monitoring or encouragement for participation or interaction.  Function - Problem Solving Problem solving assist level: Solves basic 75 - 89% of the time/requires cueing 10 - 24% of the time  Function - Memory Memory assist level: Recognizes or recalls 90% of the time/requires cueing < 10% of the time Patient normally able to recall (first 3 days only): Current season, Location of own room, Staff names and faces, That he or she is in a hospital  Medical Problem List and Plan: 1.  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  DVT Prophylaxis/Anticoagulation: Subcutaneous Lovenox. Monitor for any bleeding episodes  Resume eliquis per Neuro, CT reviewed, no acute changes, will follow up regarding Neuro recs 3. Pain Management/headaches: Fioricet as needed 4. Dysphagia. Dysphagia #3 nectar thick liquid diet. Monitor hydration. Follow-up speech therapy, 5. Neuropsych: This patient is capable of making decisions on her own behalf. 6. Skin/Wound Care: Routine skin checks 7. Fluids/Electrolytes/Nutrition: Routine I&Os  intake of fluids , improving, DCed IV  Labs reviewed WNL   8. Diabetes mellitus peripheral neuropathy. Hemoglobin A1c 6.1.SSI. Check blood sugars before meals and at bedtime. Patient on Glucophage 500 mg twice a day prior to admission  No need to resume at the current time, will cont to monitor CBG (last 3)   Recent Labs  10/27/16 2117 10/28/16 0640 10/28/16 1124  GLUCAP 148* 127* 91   9. Atrial fibrillation. Patient on aspirin 81 mg daily prior to admission and monitor closely for history of falls with SDH. Cardiac rate controlled.   10.  Hypoalb pro-stat, twice a day 11.Urinary frequency. UCX negative  Trial Enablex started 1/13  12. Cough  Robitussin DM ordered 1/13  A FACE TO FACE EVALUATION WAS PERFORMED  Kierrah Kilbride [name] 10/29/2016, 9:39 AM

## 2016-10-30 MED ORDER — DARIFENACIN HYDROBROMIDE ER 7.5 MG PO TB24
7.5000 mg | ORAL_TABLET | Freq: Every day | ORAL | Status: DC
  Administered 2016-10-30 – 2016-10-31 (×2): 7.5 mg via ORAL
  Filled 2016-10-30 (×3): qty 1

## 2016-10-30 NOTE — Progress Notes (Signed)
Subjective/Complaints: Pt seen laying in bed this AM. Daughter at bedside, who translates.  Pt slept well overnight.  Her cough and bladder symptoms have improved.   ROS: Denies CP, SOB, nausea, vomiting, diarrhea.  Objective: Vital Signs: Blood pressure 131/75, pulse 94, temperature 98.4 F (36.9 C), temperature source Oral, resp. rate 18, height _0  (1.473 m), weight 60.1 kg (132 lb 8 oz), SpO2 99 %. Ct Head Wo Contrast  Result Date: 10/28/2016 CLINICAL DATA:  Subdural hematoma follow-up EXAM: CT HEAD WITHOUT CONTRAST TECHNIQUE: Contiguous axial images were obtained from the base of the skull through the vertex without intravenous contrast. COMPARISON:  MRI 10/16/2016.  CT 10/15/2016. FINDINGS: Brain: Physiologic calcifications in the basal ganglia bilaterally. No acute intracranial abnormality. Specifically, no hemorrhage, hydrocephalus, mass lesion, acute infarction, or significant intracranial injury. No visible subdural hematoma. Vascular: No hyperdense vessel or unexpected calcification. Skull: No acute calvarial abnormality. Sinuses/Orbits: Mucosal thickening in the frontal sinuses and anterior left ethmoid air cells. Mastoid air cells are clear. Orbital soft tissues unremarkable. Other: None IMPRESSION: No acute intracranial abnormality. Electronically Signed   By: Rolm Baptise M.D.   On: 10/28/2016 11:49   Results for orders placed or performed during the hospital encounter of 10/19/16 (from the past 72 hour(s))  Glucose, capillary     Status: Abnormal   Collection Time: 10/27/16 11:53 AM  Result Value Ref Range   Glucose-Capillary 106 (H) 65 - 99 mg/dL  Glucose, capillary     Status: Abnormal   Collection Time: 10/27/16  4:45 PM  Result Value Ref Range   Glucose-Capillary 134 (H) 65 - 99 mg/dL  Glucose, capillary     Status: Abnormal   Collection Time: 10/27/16  9:17 PM  Result Value Ref Range   Glucose-Capillary 148 (H) 65 - 99 mg/dL  Glucose, capillary     Status:  Abnormal   Collection Time: 10/28/16  6:40 AM  Result Value Ref Range   Glucose-Capillary 127 (H) 65 - 99 mg/dL  Troponin I     Status: None   Collection Time: 10/28/16 10:32 AM  Result Value Ref Range   Troponin I <0.03 <0.03 ng/mL  CK total and CKMB (cardiac)not at [facility]     Status: Abnormal   Collection Time: 10/28/16 10:32 AM  Result Value Ref Range   Total CK 18 (L) 38 - 234 U/L   CK, MB 0.6 0.5 - 5.0 ng/mL   Relative Index RELATIVE INDEX IS INVALID 0.0 - 2.5    Comment: WHEN CK < 100 U/L          Basic metabolic panel     Status: None   Collection Time: 10/28/16 10:32 AM  Result Value Ref Range   Sodium 141 135 - 145 mmol/L   Potassium 3.9 3.5 - 5.1 mmol/L   Chloride 107 101 - 111 mmol/L   CO2 24 22 - 32 mmol/L   Glucose, Bld 97 65 - 99 mg/dL   BUN 16 6 - 20 mg/dL   Creatinine, Ser 0.57 0.44 - 1.00 mg/dL   Calcium 9.2 8.9 - 10.3 mg/dL   GFR calc non Af Amer >60 >60 mL/min   GFR calc Af Amer >60 >60 mL/min    Comment: (NOTE) The eGFR has been calculated using the CKD EPI equation. This calculation has not been validated in all clinical situations. eGFR's persistently <60 mL/min signify possible Chronic Kidney Disease.    Anion gap 10 5 - 15  CBC with Differential/Platelet  Status: Abnormal   Collection Time: 10/28/16 10:32 AM  Result Value Ref Range   WBC 5.2 4.0 - 10.5 K/uL   RBC 4.03 3.87 - 5.11 MIL/uL   Hemoglobin 12.4 12.0 - 15.0 g/dL   HCT 37.9 36.0 - 46.0 %   MCV 94.0 78.0 - 100.0 fL   MCH 30.8 26.0 - 34.0 pg   MCHC 32.7 30.0 - 36.0 g/dL   RDW 12.3 11.5 - 15.5 %   Platelets 427 (H) 150 - 400 K/uL   Neutrophils Relative % 53 %   Neutro Abs 2.8 1.7 - 7.7 K/uL   Lymphocytes Relative 31 %   Lymphs Abs 1.6 0.7 - 4.0 K/uL   Monocytes Relative 13 %   Monocytes Absolute 0.7 0.1 - 1.0 K/uL   Eosinophils Relative 2 %   Eosinophils Absolute 0.1 0.0 - 0.7 K/uL   Basophils Relative 1 %   Basophils Absolute 0.1 0.0 - 0.1 K/uL  Glucose, capillary     Status:  None   Collection Time: 10/28/16 11:24 AM  Result Value Ref Range   Glucose-Capillary 91 65 - 99 mg/dL     HEENT: normocephalic, atraumatic.  Cardio: IRRR, No JVD Resp: CTA B/L. unlabored GI: BS positive and nondistended Skin:   Intact. Warm and dry Neuro: Alert/Oriented,  Motor: 4/5 Left delt Bi Tri, Grip,  4+/5 Left HF, KE, 3/5 L ankle DF Tone normal.  Musc/Skel:  No Edema. No tenderness Gen NAD. Vital signs reviewed   Assessment/Plan: 1. 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 Physiatrist is providing close team supervision and 24 hour management of active medical problems listed below. Physiatrist and rehab team continue to assess barriers to discharge/monitor patient progress toward functional and medical goals. FIM: Function - Bathing Position: Shower Body parts bathed by patient: Right arm, Left arm, Chest, Abdomen, Front perineal area, Right upper leg, Left upper leg, Right lower leg, Left lower leg, Buttocks Body parts bathed by helper: Back Bathing not applicable: Back (Did not attempt) Assist Level: Supervision or verbal cues  Function- Upper Body Dressing/Undressing What is the patient wearing?: Pull over shirt/dress Bra - Perfomed by patient: Thread/unthread right bra strap, Thread/unthread left bra strap, Hook/unhook bra (pull down sports bra) Bra - Perfomed by helper: Hook/unhook bra (pull down sports bra) Pull over shirt/dress - Perfomed by patient: Thread/unthread right sleeve, Thread/unthread left sleeve, Put head through opening, Pull shirt over trunk Pull over shirt/dress - Perfomed by helper: Thread/unthread right sleeve, Thread/unthread left sleeve, Put head through opening, Pull shirt over trunk Button up shirt - Perfomed by patient: Thread/unthread right sleeve, Button/unbutton shirt Button up shirt - Perfomed by  helper: Thread/unthread left sleeve, Pull shirt around back Assist Level: Supervision or verbal cues Function - Lower Body Dressing/Undressing What is the patient wearing?: Underwear, Pants, Non-skid slipper socks, Ted Hose Position: Other (comment) (sitting on tub bench after shower) Underwear - Performed by patient: Thread/unthread right underwear leg, Thread/unthread left underwear leg, Pull underwear up/down Underwear - Performed by helper: Thread/unthread right underwear leg, Thread/unthread left underwear leg, Pull underwear up/down Pants- Performed by patient: Thread/unthread right pants leg, Thread/unthread left pants leg, Pull pants up/down Pants- Performed by helper: Thread/unthread right pants leg, Thread/unthread left pants leg, Pull pants up/down, Fasten/unfasten pants Non-skid slipper socks- Performed by patient: Don/doff right sock, Don/doff left sock Non-skid slipper socks- Performed by helper: Don/doff right sock, Don/doff  left sock TED Hose - Performed by helper: Don/doff right TED hose, Don/doff left TED hose Assist for footwear: Setup Assist for lower body dressing: Supervision or verbal cues  Function - Toileting Toileting steps completed by patient: Adjust clothing prior to toileting, Performs perineal hygiene, Adjust clothing after toileting Toileting Assistive Devices: Grab bar or rail Assist level: Supervision or verbal cues  Function - Air cabin crew transfer assistive device: Grab bar, Walker Assist level to toilet: Supervision or verbal cues Assist level from toilet: Supervision or verbal cues Assist level to bedside commode (at bedside): Touching or steadying assistance (Pt > 75%) (per Benjamin Stain, NT) Assist level from bedside commode (at bedside): Touching or steadying assistance (Pt > 75%)  Function - Chair/bed transfer Chair/bed transfer method: Ambulatory Chair/bed transfer assist level: Touching or steadying assistance (Pt > 75%) Chair/bed  transfer assistive device: Walker Chair/bed transfer details: Verbal cues for safe use of DME/AE, Visual cues for safe use of DME/AE  Function - Locomotion: Wheelchair Will patient use wheelchair at discharge?: No Type: Manual Max wheelchair distance: 39f  Assist Level: Touching or steadying assistance (Pt > 75%) Assist Level: Touching or steadying assistance (Pt > 75%) Wheel 150 feet activity did not occur: Safety/medical concerns Turns around,maneuvers to table,bed, and toilet,negotiates 3% grade,maneuvers on rugs and over doorsills: No Function - Locomotion: Ambulation Assistive device: Walker-rolling Max distance: 200 Assist level: Supervision or verbal cues Assist level: Supervision or verbal cues Assist level: Supervision or verbal cues Walk 150 feet activity did not occur: Refused Assist level: Supervision or verbal cues Assist level: Touching or steadying assistance (Pt > 75%)  Function - Comprehension Comprehension: Auditory Comprehension assist level: Follows basic conversation/direction with no assist  Function - Expression Expression: Verbal Expression assist level: Expresses basic needs/ideas: With no assist  Function - Social Interaction Social Interaction assist level: Interacts appropriately 90% of the time - Needs monitoring or encouragement for participation or interaction.  Function - Problem Solving Problem solving assist level: Solves basic 90% of the time/requires cueing < 10% of the time  Function - Memory Memory assist level: Recognizes or recalls 90% of the time/requires cueing < 10% of the time Patient normally able to recall (first 3 days only): Current season, Location of own room, Staff names and faces, That he or she is in a hospital  Medical Problem List and Plan: 1.  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   DVT Prophylaxis/Anticoagulation: Subcutaneous Lovenox. Monitor for any bleeding episodes  Resume eliquis per Neuro, CT reviewed, no acute changes, will follow up regarding Neuro recs 3. Pain Management/headaches: Fioricet as needed 4. Dysphagia. Dysphagia #3 nectar thick liquid diet. Monitor hydration. Follow-up speech therapy, 5. Neuropsych: This patient is capable of making decisions on her own behalf. 6. Skin/Wound Care: Routine skin checks 7. Fluids/Electrolytes/Nutrition: Routine I&Os  intake of fluids , improving, DCed IV  Labs reviewed WNL 1/12 8. Diabetes mellitus peripheral neuropathy. Hemoglobin A1c 6.1.SSI. Check blood sugars before meals and at bedtime. Patient on Glucophage 500 mg twice a day prior to admission  No need to resume at the current time, will cont to monitor CBG (last 3)   Recent Labs  10/27/16 2117 10/28/16 0640 10/28/16 1124  GLUCAP 148* 127* 91   9. Atrial fibrillation. Patient on aspirin 81 mg daily prior to admission and monitor closely for history of falls with SDH. Cardiac rate controlled.  10.  Hypoalb pro-stat, twice a day 11.Urinary frequency. UCX negative  Enablex started 1/13   Improving 12. Cough  Robitussin DM ordered 1/13  Improving  A FACE TO FACE EVALUATION WAS PERFORMED  Ankit [name] 10/30/2016, 7:51 AM

## 2016-10-31 ENCOUNTER — Inpatient Hospital Stay (HOSPITAL_COMMUNITY): Payer: Medicare Other | Admitting: Speech Pathology

## 2016-10-31 ENCOUNTER — Inpatient Hospital Stay (HOSPITAL_COMMUNITY): Payer: Medicare Other | Admitting: Occupational Therapy

## 2016-10-31 ENCOUNTER — Inpatient Hospital Stay (HOSPITAL_COMMUNITY): Payer: Medicare Other

## 2016-10-31 LAB — GLUCOSE, CAPILLARY
GLUCOSE-CAPILLARY: 147 mg/dL — AB (ref 65–99)
GLUCOSE-CAPILLARY: 150 mg/dL — AB (ref 65–99)
GLUCOSE-CAPILLARY: 107 mg/dL — AB (ref 65–99)
GLUCOSE-CAPILLARY: 129 mg/dL — AB (ref 65–99)
GLUCOSE-CAPILLARY: 164 mg/dL — AB (ref 65–99)
GLUCOSE-CAPILLARY: 115 mg/dL — AB (ref 65–99)
Glucose-Capillary: 110 mg/dL — ABNORMAL HIGH (ref 65–99)
Glucose-Capillary: 134 mg/dL — ABNORMAL HIGH (ref 65–99)
Glucose-Capillary: 105 mg/dL — ABNORMAL HIGH (ref 65–99)
Glucose-Capillary: 122 mg/dL — ABNORMAL HIGH (ref 65–99)
Glucose-Capillary: 116 mg/dL — ABNORMAL HIGH (ref 65–99)

## 2016-10-31 NOTE — Progress Notes (Signed)
Decreased frequency at HS since starting enablex. No c/o cough.

## 2016-10-31 NOTE — Progress Notes (Signed)
Occupational Therapy Session Note  Patient Details  Name: [name] MRN: [id number] Date of Birth: 04/22/1941  Today's Date: 10/31/2016 OT Individual Time: 0905-1001 OT Individual Time Calculation (min): 56 min     Short Term Goals: Week 2:  OT Short Term Goal 1 (Week 2): LTGs equal to STGS set at overall supervision to modified independent level.   Skilled Therapeutic Interventions/Progress Updates:    Pt completed functional mobility down to the ADL apartment with use of the RW during session with close supervision.  Daughter present and requested more practice with home management and kitchen tasks with the RW.  The pt was instructed to use the RW to move items around the kitchen to put them back in the appropriate location.   Mod instructional cueing to turn the walker the same direction she is facing when reaching up and down in the cabinets.  Also still needs cueing to not take steps forward or backward when reaching to place items on the counter, when both hands are not on the walker.  Used the RW as well to ambulated around the bed to straighten up the comforter and to replace pillows.  Daughter plans on not allowing pt to cook or complete home management tasks unless supervised.  Ambulated back to room where pt was left sitting EOB per her request, and daughter providing supervision.    Therapy Documentation Precautions:  Precautions Precautions: Fall Restrictions Weight Bearing Restrictions: No  Pain: Pain Assessment Pain Assessment: No/denies pain ADL: See Function Navigator for Current Functional Status.   Therapy/Group: Individual Therapy  [name] OTR/L 10/31/2016, 12:23 PM

## 2016-10-31 NOTE — Progress Notes (Signed)
Subjective/Complaints: Daughter at bedside, she is able to translate for patient, continues to have palpitations Patient is excited about discharge in a.m.  ROS: Denies CP, SOB, nausea, vomiting, diarrhea.  Objective: Vital Signs: Blood pressure 109/66, pulse 81, temperature 97.5 F (36.4 C), temperature source Oral, resp. rate 18, height '4\' 10"'  (1.473 m), weight 60.1 kg (132 lb 8 oz), SpO2 98 %. No results found. Results for orders placed or performed during the hospital encounter of 10/19/16 (from the past 72 hour(s))  Troponin I     Status: None   Collection Time: 10/28/16 10:32 AM  Result Value Ref Range   Troponin I <0.03 <0.03 ng/mL  CK total and CKMB (cardiac)not at [facility], [place]     Status: Abnormal   Collection Time: 10/28/16 10:32 AM  Result Value Ref Range   Total CK 18 (L) 38 - 234 U/L   CK, MB 0.6 0.5 - 5.0 ng/mL   Relative Index RELATIVE INDEX IS INVALID 0.0 - 2.5    Comment: WHEN CK < 100 U/L          Basic metabolic panel     Status: None   Collection Time: 10/28/16 10:32 AM  Result Value Ref Range   Sodium 141 135 - 145 mmol/L   Potassium 3.9 3.5 - 5.1 mmol/L   Chloride 107 101 - 111 mmol/L   CO2 24 22 - 32 mmol/L   Glucose, Bld 97 65 - 99 mg/dL   BUN 16 6 - 20 mg/dL   Creatinine, Ser 0.57 0.44 - 1.00 mg/dL   Calcium 9.2 8.9 - 10.3 mg/dL   GFR calc non Af Amer >60 >60 mL/min   GFR calc Af Amer >60 >60 mL/min    Comment: (NOTE) The eGFR has been calculated using the CKD EPI equation. This calculation has not been validated in all clinical situations. eGFR's persistently <60 mL/min signify possible Chronic Kidney Disease.    Anion gap 10 5 - 15  CBC with Differential/Platelet     Status: Abnormal   Collection Time: 10/28/16 10:32 AM  Result Value Ref Range   WBC 5.2 4.0 - 10.5 K/uL   RBC 4.03 3.87 - 5.11 MIL/uL   Hemoglobin 12.4 12.0 - 15.0 g/dL   HCT 37.9 36.0 - 46.0 %   MCV 94.0 78.0 - 100.0 fL   MCH 30.8 26.0 - 34.0 pg   MCHC 32.7 30.0 - 36.0 g/dL   RDW 12.3 11.5 - 15.5 %   Platelets 427 (H) 150 - 400 K/uL   Neutrophils Relative % 53 %   Neutro Abs 2.8 1.7 - 7.7 K/uL   Lymphocytes Relative 31 %   Lymphs Abs 1.6 0.7 - 4.0 K/uL   Monocytes Relative 13 %   Monocytes Absolute 0.7 0.1 - 1.0 K/uL   Eosinophils Relative 2 %   Eosinophils Absolute 0.1 0.0 - 0.7 K/uL   Basophils Relative 1 %   Basophils Absolute 0.1 0.0 - 0.1 K/uL  Glucose, capillary     Status: None   Collection Time: 10/28/16 11:24 AM  Result Value Ref Range   Glucose-Capillary 91 65 - 99 mg/dL  Glucose, capillary     Status: Abnormal   Collection Time: 10/28/16  4:44 PM  Result Value Ref Range   Glucose-Capillary 147 (H) 65 - 99 mg/dL  Glucose, capillary     Status: Abnormal   Collection Time: 10/28/16  9:03 PM  Result Value Ref Range   Glucose-Capillary 150 (H) 65 - 99 mg/dL  Glucose, capillary     Status: Abnormal   Collection Time: 10/29/16  7:10 AM  Result Value Ref Range   Glucose-Capillary 110 (H) 65 - 99 mg/dL  Glucose, capillary     Status: Abnormal   Collection Time: 10/29/16 11:48 AM  Result Value Ref Range   Glucose-Capillary 134 (H) 65 - 99 mg/dL  Glucose, capillary     Status: Abnormal   Collection Time: 10/29/16  4:59 PM  Result Value Ref Range   Glucose-Capillary 107 (H) 65 - 99 mg/dL  Glucose, capillary     Status: Abnormal   Collection Time: 10/29/16  9:07 PM  Result Value Ref Range   Glucose-Capillary 129 (H) 65 - 99 mg/dL   Comment 1 Notify RN   Glucose, capillary     Status: Abnormal   Collection Time: 10/30/16  6:31 AM  Result Value Ref Range   Glucose-Capillary 105 (H) 65 - 99 mg/dL   Comment 1 Notify RN   Glucose, capillary     Status: Abnormal   Collection Time: 10/30/16 11:39 AM  Result Value Ref Range   Glucose-Capillary 122 (H) 65 - 99 mg/dL   Comment 1 Notify RN   Glucose, capillary     Status: Abnormal   Collection Time: 10/30/16  5:08 PM  Result Value Ref Range   Glucose-Capillary 116 (H) 65 - 99 mg/dL   Comment 1  Notify RN   Glucose, capillary     Status: Abnormal   Collection Time: 10/30/16  9:16 PM  Result Value Ref Range   Glucose-Capillary 164 (H) 65 - 99 mg/dL   Comment 1 Notify RN   Glucose, capillary     Status: Abnormal   Collection Time: 10/31/16  6:48 AM  Result Value Ref Range   Glucose-Capillary 115 (H) 65 - 99 mg/dL   Comment 1 Notify RN      HEENT: normocephalic, atraumatic.  Cardio: IRRR, No JVD Resp: CTA B/L. unlabored GI: BS positive and nondistended Skin:   Intact. Warm and dry Neuro: Alert/Oriented,  Motor: 4/5 Left delt Bi Tri, Grip,  4+/5 Left HF, KE, 3/5 L ankle DF Tone normal.  Musc/Skel:  No Edema. No tenderness Gen NAD. Vital signs reviewed   Assessment/Plan: 1. 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 Physiatrist is providing close team supervision and 24 hour management of active medical problems listed below. Physiatrist and rehab team continue to assess barriers to discharge/monitor patient progress toward functional and medical goals. FIM: Function - Bathing Position: Shower Body parts bathed by patient: Right arm, Left arm, Chest, Abdomen, Front perineal area, Right upper leg, Left upper leg, Right lower leg, Left lower leg, Buttocks Body parts bathed by helper: Back Bathing not applicable: Back (Did not attempt) Assist Level: Supervision or verbal cues  Function- Upper Body Dressing/Undressing What is the patient wearing?: Pull over shirt/dress Bra - Perfomed by patient: Thread/unthread right bra strap, Thread/unthread left bra strap, Hook/unhook bra (pull down sports bra) Bra - Perfomed by helper: Hook/unhook bra (pull down sports bra) Pull over shirt/dress - Perfomed by patient: Thread/unthread right sleeve, Thread/unthread left sleeve, Put head through opening, Pull shirt over trunk Pull over shirt/dress -  Perfomed by helper: Thread/unthread right sleeve, Thread/unthread left sleeve, Put head through opening, Pull shirt over trunk Button up shirt - Perfomed by patient: Thread/unthread right sleeve, Button/unbutton shirt Button up shirt - Perfomed by  helper: Thread/unthread left sleeve, Pull shirt around back Assist Level: Supervision or verbal cues Function - Lower Body Dressing/Undressing What is the patient wearing?: Underwear, Pants, Non-skid slipper socks, Ted Hose Position: Other (comment) (sitting on tub bench after shower) Underwear - Performed by patient: Thread/unthread right underwear leg, Thread/unthread left underwear leg, Pull underwear up/down Underwear - Performed by helper: Thread/unthread right underwear leg, Thread/unthread left underwear leg, Pull underwear up/down Pants- Performed by patient: Thread/unthread right pants leg, Thread/unthread left pants leg, Pull pants up/down Pants- Performed by helper: Thread/unthread right pants leg, Thread/unthread left pants leg, Pull pants up/down, Fasten/unfasten pants Non-skid slipper socks- Performed by patient: Don/doff right sock, Don/doff left sock Non-skid slipper socks- Performed by helper: Don/doff right sock, Don/doff left sock TED Hose - Performed by helper: Don/doff right TED hose, Don/doff left TED hose Assist for footwear: Setup Assist for lower body dressing: Supervision or verbal cues  Function - Toileting Toileting steps completed by patient: Adjust clothing prior to toileting, Performs perineal hygiene, Adjust clothing after toileting Toileting Assistive Devices: Grab bar or rail Assist level: Supervision or verbal cues  Function - Air cabin crew transfer assistive device: Landscape architect, Walker Assist level to toilet: Supervision or verbal cues Assist level from toilet: Supervision or verbal cues Assist level to bedside commode (at bedside): Touching or steadying assistance (Pt > 75%) Assist level from bedside  commode (at bedside): Touching or steadying assistance (Pt > 75%)  Function - Chair/bed transfer Chair/bed transfer method: Ambulatory Chair/bed transfer assist level: Touching or steadying assistance (Pt > 75%) Chair/bed transfer assistive device: Walker Chair/bed transfer details: Verbal cues for safe use of DME/AE, Visual cues for safe use of DME/AE  Function - Locomotion: Wheelchair Will patient use wheelchair at discharge?: No Type: Manual Max wheelchair distance: 81f  Assist Level: Touching or steadying assistance (Pt > 75%) Assist Level: Touching or steadying assistance (Pt > 75%) Wheel 150 feet activity did not occur: Safety/medical concerns Turns around,maneuvers to table,bed, and toilet,negotiates 3% grade,maneuvers on rugs and over doorsills: No Function - Locomotion: Ambulation Assistive device: Walker-rolling Max distance: 200 Assist level: Supervision or verbal cues Assist level: Supervision or verbal cues Assist level: Supervision or verbal cues Walk 150 feet activity did not occur: Refused Assist level: Supervision or verbal cues Assist level: Touching or steadying assistance (Pt > 75%)  Function - Comprehension Comprehension: Auditory Comprehension assist level: Follows basic conversation/direction with no assist  Function - Expression Expression: Verbal Expression assist level: Expresses basic needs/ideas: With no assist  Function - Social Interaction Social Interaction assist level: Interacts appropriately 90% of the time - Needs monitoring or encouragement for participation or interaction.  Function - Problem Solving Problem solving assist level: Solves basic 90% of the time/requires cueing < 10% of the time  Function - Memory Memory assist level: Recognizes or recalls 90% of the time/requires cueing < 10% of the time Patient normally able to recall (first 3 days only): Current season, Location of own room, Staff names and faces, That he or she is in a  hospital  Medical Problem List and Plan: 1.  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 2.  DVT Prophylaxis/Anticoagulation: Subcutaneous Lovenox. Monitor for any bleeding episodes  Ask neuro to review CT, patient's daughter is wondering about when to resume anticoagulation. Agrees that her mother should be on it. Question was whether it should be initiated  at 2 weeks or 3 weeks. She is currently 16 days post stroke, CT brain report. No acute changes 3. Pain Management/headaches: Fioricet as needed 4. Dysphagia. Dysphagia #3 nectar thick liquid diet. Monitor hydration. Follow-up speech therapy, 5. Neuropsych: This patient is capable of making decisions on her own behalf. 6. Skin/Wound Care: Routine skin checks 7. Fluids/Electrolytes/Nutrition: Routine I&Os  intake of fluids , improving, DCed IV  Labs reviewed WNL 1/12 8. Diabetes mellitus peripheral neuropathy. Hemoglobin A1c 6.1.SSI. Check blood sugars before meals and at bedtime. Patient on Glucophage 500 mg twice a day prior to admission  No need to resume at the current time, will cont to monitor CBG (last 3)   Recent Labs  10/30/16 1708 10/30/16 2116 10/31/16 0648  GLUCAP 116* 164* 115*   9. Atrial fibrillation. Patient on aspirin 81 mg daily prior to admission and monitor closely for history of falls with SDH. Cardiac rate controlled.   10.  Hypoalb pro-stat, twice a day 11.Urinary frequency. UCX negative  Enablex started 1/13   . No current complaints 12. Cough  Resolved  A FACE TO FACE EVALUATION WAS PERFORMED  Jahson Emanuele E 10/31/2016, 10:28 AM

## 2016-10-31 NOTE — Discharge Instructions (Signed)
Inpatient Rehab Discharge Instructions  [facility] Discharge date and time: No discharge date for patient encounter.   Activities/Precautions/ Functional Status: Activity: activity as tolerated Diet: Mechanical soft nectar liquids Wound Care: none needed Functional status:  ___ No restrictions     ___ Walk up steps independently ___ 24/7 supervision/assistance   ___ Walk up steps with assistance ___ Intermittent supervision/assistance  ___ Bathe/dress independently ___ Walk with walker     _x__ Bathe/dress with assistance ___ Walk Independently    ___ Shower independently ___ Walk with assistance    ___ Shower with assistance ___ No alcohol     ___ Return to work/school ________  Special Instructions:  Follow-up with neurology services on plans to begin Eliquis   COMMUNITY REFERRALS UPON DISCHARGE:    Home Health:   PT,OT,SP,RN  Agency:[facility] [phone]   Date of last service:11/01/2016  Medical Equipment/Items Ordered:BEDSIDE COMMODE & TUB BENCH  Agency/Supplier:[facility]   [phone] Other:PRIVATE DUTY AGENCY LIST GIVEN TO DAUGHTER  GENERAL COMMUNITY RESOURCES FOR PATIENT/FAMILY: Support Groups:[facility] SUPPORT GROUP  EVERY SECOND Thursday @ 3;00-4:00 PM ON THE REHAB UNIT QUESTIONS CONTACT [facility] [phone]  STROKE/TIA DISCHARGE INSTRUCTIONS SMOKING Cigarette smoking nearly doubles your risk of having a stroke & is the single most alterable risk factor  If you smoke or have smoked in the last 12 months, you are advised to quit smoking for your health.  Most of the excess cardiovascular risk related to smoking disappears within a year of stopping.  Ask you doctor about anti-smoking medications  [place] Quit Line: 1-800-QUIT NOW  Free Smoking Cessation Classes [phone]  CHOLESTEROL Know your levels; limit fat & cholesterol in your diet  Lipid Panel     Component Value Date/Time   CHOL 128 10/16/2016 0356   TRIG 198 (H) 10/16/2016 0356   HDL 29 (L) 10/16/2016 0356   CHOLHDL 4.4 10/16/2016 0356   VLDL 40 10/16/2016 0356   LDLCALC 59 10/16/2016 0356      Many patients benefit from treatment even if their cholesterol is at goal.  Goal: Total Cholesterol (CHOL) less than 160  Goal:  Triglycerides (TRIG) less than 150  Goal:  HDL greater than 40  Goal:  LDL (LDLCALC) less than 100   BLOOD PRESSURE American Stroke Association blood pressure target is less that 120/80 mm/Hg  Your discharge blood pressure is:  BP: 113/62  Monitor your blood pressure  Limit your salt and alcohol intake  Many individuals will require more than one medication for high blood pressure  DIABETES (A1c is a blood sugar average for last 3 months) Goal HGBA1c is under 7% (HBGA1c is blood sugar average for last 3 months)  Diabetes: No known diagnosis of diabetes    Lab Results  Component Value Date   HGBA1C 6.1 (H) 10/16/2016     Your HGBA1c can be lowered with medications, healthy diet, and exercise.  Check your blood sugar as directed by your physician  Call your physician if you experience unexplained or low blood sugars.  PHYSICAL ACTIVITY/REHABILITATION Goal is 30 minutes at least 4 days per week  Activity: Increase activity slowly, Therapies: Physical Therapy: Home Health Return to work:   Activity decreases your risk of heart attack and stroke and makes your heart stronger.  It helps control your weight and blood pressure; helps you relax and can improve your mood.  Participate in a regular exercise program.  Talk with your doctor about the best form of exercise for you (dancing, walking,  swimming, cycling).  DIET/WEIGHT Goal is to maintain a healthy weight  Your discharge diet is: DIET DYS 3 Room service appropriate? Yes; Fluid consistency: Nectar Thick  liquids Your height is:  Height: 4\' 10"  (147.3 cm) Your current weight is: Weight: 60.1 kg (132 lb 8 oz) Your Body Mass Index (BMI) is:  BMI (Calculated): 27.6  Following the  type of diet specifically designed for you will help prevent another stroke.  Your goal weight range is:    Your goal Body Mass Index (BMI) is 19-24.  Healthy food habits can help reduce 3 risk factors for stroke:  High cholesterol, hypertension, and excess weight.  RESOURCES Stroke/Support Group:  Call [phone]   STROKE EDUCATION PROVIDED/REVIEWED AND GIVEN TO PATIENT Stroke warning signs and symptoms How to activate emergency medical system (call 911). Medications prescribed at discharge. Need for follow-up after discharge. Personal risk factors for stroke. Pneumonia vaccine given:  Flu vaccine given:  My questions have been answered, the writing is legible, and I understand these instructions.  I will adhere to these goals & educational materials that have been provided to me after my discharge from the hospital.     My questions have been answered and I understand these instructions. I will adhere to these goals and the provided educational materials after my discharge from the hospital.  Patient/Caregiver Signature _______________________________ Date __________  Clinician Signature _______________________________________ Date __________  Please bring this form and your medication list with you to all your follow-up doctor's appointments.

## 2016-10-31 NOTE — Progress Notes (Signed)
Physical Therapy Discharge Summary  Patient Details  Name: [name] MRN: [id number] Date of Birth: 24-Jul-1941  Today's Date: 11/01/2016   Patient has met 10 of 12 long term goals due to improved activity tolerance, improved balance, increased strength, ability to compensate for deficits, functional use of  left upper extremity and left lower extremity, improved attention, improved awareness and improved coordination.  Patient to discharge at an ambulatory level Supervision using RW.   Patient's family is independent to provide the necessary cognitive assistance at discharge.  Reasons goals not met: Transfers (basic and furniture) were not met due to goal set at modified independent and pt is at supervision level. Pt currently requires cues at times for safety and attention.   Recommendation:  Patient will benefit from ongoing skilled PT services in home health setting to continue to advance safe functional mobility, address ongoing impairments in motor, attention, activity tolerance, balance, and minimize fall risk.  Equipment: Pt has RW already.  Reasons for discharge: treatment goals met and discharge from hospital  Patient/family agrees with progress made and goals achieved: Yes  PT Discharge Precautions/RestrictionsPrecautions Precautions: Fall Precaution Comments: left inattention, mild Restrictions Weight Bearing Restrictions: No Vision/Perception    L inattention Cognition Overall Cognitive Status: Impaired/Different from baseline Arousal/Alertness: Awake/alert Orientation Level: Oriented X4 Attention: Sustained Focused Attention: Appears intact Sustained Attention: Appears intact Memory: Impaired Memory Impairment: Retrieval deficit;Decreased recall of new information Awareness: Impaired Awareness Impairment: Emergent impairment Problem Solving: Appears intact Behaviors: Impulsive Safety/Judgment: Impaired Sensation Sensation Light Touch: Impaired by gross  assessment Proprioception: Impaired by gross assessment Additional Comments: L great toe accurate 2/5 movements Coordination Gross Motor Movements are Fluid and Coordinated: No Fine Motor Movements are Fluid and Coordinated: No Heel Shin Test: LLE accuracy and excursion limited Motor  Motor Motor - Discharge Observations: mild L hemi; some jerkiness/coordination deficits of movement  Mobility Bed Mobility Bed Mobility: Rolling Right;Rolling Left;Supine to Sit;Sit to Supine Rolling Right: 7: Independent Rolling Left: 7: Independent Supine to Sit: 6: Modified independent (Device/Increase time) Sit to Supine: 6: Modified independent (Device/Increase time) Transfers Transfers: Yes Sit to Stand: 5: Supervision Sit to Stand Details: Verbal cues for safe use of DME/AE Stand to Sit: 5: Supervision Stand to Sit Details (indicate cue type and reason): Verbal cues for safe use of DME/AE Stand Pivot Transfers: 5: Supervision Stand Pivot Transfer Details: Verbal cues for safe use of DME/AE Locomotion  Ambulation Ambulation: Yes Ambulation/Gait Assistance: 6: Modified independent (Device/Increase time) Ambulation Distance (Feet): 200 Feet Assistive device: Rolling walker Ambulation/Gait Assistance Details: Verbal cues for safe use of DME/AE;Verbal cues for precautions/safety Gait Gait: Yes Gait Pattern: Impaired Gait Pattern: Narrow base of support;Trunk rotated posteriorly on left;Left foot flat;Decreased hip/knee flexion - left;Decreased dorsiflexion - left Stairs / Additional Locomotion Stairs: Yes Stairs Assistance: 5: Supervision Stairs Assistance Details: Verbal cues for gait pattern Stair Management Technique: One rail Left Number of Stairs: 12 Height of Stairs: 3 (and 6) Ramp: 5: Supervision Curb: Not tested (comment) Wheelchair Mobility Wheelchair Mobility: No  Trunk/Postural Assessment  Cervical Assessment Cervical Assessment: Within Functional Limits Thoracic  Assessment Thoracic Assessment: Exceptions to [facility] (thoracic rounding noted; hx of scoliosis) Lumbar Assessment Lumbar Assessment: Exceptions to [facility] (posterior lumbar pelvic tilt in sitting) Postural Control Postural Control: Within Functional Limits  Balance Balance Balance Assessed: Yes Standardized Balance Assessment Standardized Balance Assessment: Berg Balance Test Berg Balance Test Sit to Stand: Able to stand  independently using hands Standing Unsupported: Able to stand 2 minutes with supervision Sitting with  Back Unsupported but Feet Supported on Floor or Stool: Able to sit safely and securely 2 minutes Stand to Sit: Sits safely with minimal use of hands Transfers: Able to transfer with verbal cueing and /or supervision Standing Unsupported with Eyes Closed: Able to stand 10 seconds with supervision Standing Ubsupported with Feet Together: Needs help to attain position but able to stand for 30 seconds with feet together From Standing, Reach Forward with Outstretched Arm: Reaches forward but needs supervision From Standing Position, Pick up Object from Floor: Able to pick up shoe, needs supervision From Standing Position, Turn to Look Behind Over each Shoulder: Looks behind from both sides and weight shifts well Turn 360 Degrees: Able to turn 360 degrees safely but slowly Standing Unsupported, Alternately Place Feet on Step/Stool: Able to complete >2 steps/needs minimal assist Standing Unsupported, One Foot in Front: Able to take small step independently and hold 30 seconds Standing on One Leg: Tries to lift leg/unable to hold 3 seconds but remains standing independently Total Score: 34 Static Sitting Balance Static Sitting - Balance Support: Feet unsupported Static Sitting - Level of Assistance: 7: Independent Dynamic Sitting Balance Dynamic Sitting - Balance Support: Feet supported Dynamic Sitting - Level of Assistance: 6: Modified independent (Device/Increase time) Static  Standing Balance Static Standing - Level of Assistance: 6: Modified independent (Device/Increase time) Dynamic Standing Balance Dynamic Standing - Balance Support: Bilateral upper extremity supported Dynamic Standing - Level of Assistance: 5: Stand by assistance Extremity Assessment   see OT d/c summary for details of UE.   RLE Assessment RLE Assessment: Within Functional Limits LLE Assessment LLE Assessment: Exceptions to [facility] LLE Strength LLE Overall Strength Comments: 4+/5 grossly throughout hip, knee and ankle, in sitting   See Function Navigator for Current Functional Status.  COOK,CAROLINE 10/31/2016, 4:39 PM   Lars Masson, PT, DPT 11/01/16 12:00 PM

## 2016-10-31 NOTE — Progress Notes (Signed)
Physical Therapy Note  Patient Details  Name: [name] MRN: [id number] Date of Birth: 11/09/1940 Today's Date: 10/31/2016  1300-1440, 100 min individual tx Pain: none reported  Family ed for [place] using hand out,  plus seated bil hip abduction activation with knees adducted to reduce substitution of external rotators.  neuromuscular re-education for 10 x 1 each R/L  bil quad and hip activation with lateral step taps. Patient demonstrates increased fall risk as noted by score of  34 /56 on Berg Balance Scale.  (<36= high risk for falls, close to 100%; 37-45 significant >80%; 46-51 moderate >50%; 52-55 lower >25%). No change from previous assessment on 1/5.  Results explained to dtr, including fall recovery with demonstration. Gait on 12 steps 1 rail as per home set up.  dtr reported that pt fx'ed LLE and knee has been unstable/weak since then.  Gait to/from room with RW, dtr Shameika guarding pt appropriately. dtr ambulated with pt back to room.      See function navigator for current status.  Rodel Glaspy 10/31/2016, 1:33 PM

## 2016-10-31 NOTE — Progress Notes (Signed)
Speech Language Pathology Discharge Summary  Patient Details  Name: [name] MRN: [id number] Date of Birth: 07/23/1941  Today's Date: 10/31/2016 SLP Individual Time: 0803-0900 SLP Individual Time Calculation (min): 57 min    Skilled Therapeutic Interventions:  Pt was seen for skilled ST targeting goals for dysphagia and cognition.  Pt consumed dys 3 textures and nectar thick liquids with mod I use of swallowing precautions and no overt s/s of aspiration with solids or liquids.  Pt's daughter verbalized understanding of current diet recommendations and rationale behind swallowing precautions.  Pt's daughter also recalled parameters of the water protocol with mod I.  SLP administered the MoCA to measure progress towards cognitive goals since initial evaluation.  Pt scored 22/30 which is indicative of ongoing mild cognitive dysfunction.  Impairments were most consistently characterized by decreased abstract reasoning and selective attention to tasks.  Pt was left with daughter to assist in ambulation back to room at the end of today's therapy session.  All questions were answered to their satisfaction at this time.  Pt and family education is complete and pt is ready for discharge tomorrow.      Patient has met 3 of 5 long term goals.  Patient to discharge at overall Supervision level.  Reasons goals not met: pt needs supervision for recall of new information and emergent awareness of safety impairments    Clinical Impression/Discharge Summary:  Pt has made functional gains and is discharging having met 3 out of 5 short term goals.  Pt's diet has been upgraded to dys 3 textures and nectar thick liquids which pt is consuming with mod I use of swallowing precautions.  Pt s is also working towards liquids advancement with trials of water in between meals following oral care per the water protocol which pt's daughter has been educated on to allow for continuation in the home environment.  Pt has  demonstrated improved safety awareness with supervision verbal cues due to impulsivity with mobility.  Pt and family education has been completed.  Pt is discharging home with intermittent supervision from family.  Recommend ongoing ST follow up at next level of care to continue to address dysphagia and cognitive function.    Care Partner:  Caregiver Able to Provide Assistance: Yes  Type of Caregiver Assistance: Physical;Cognitive  Recommendation:  Home Health SLP;Outpatient SLP  Rationale for SLP Follow Up: Maximize cognitive function and independence;Reduce caregiver burden;Maximize swallowing safety   Equipment: thickener    Reasons for discharge: Discharged from hospital   Patient/Family Agrees with Progress Made and Goals Achieved: Yes   Function:  Eating Eating   Modified Consistency Diet: Yes Eating Assist Level: More than reasonable amount of time           Cognition Comprehension Comprehension assist level: Follows basic conversation/direction with no assist  Expression   Expression assist level: Expresses basic needs/ideas: With no assist  Social Interaction Social Interaction assist level: Interacts appropriately 90% of the time - Needs monitoring or encouragement for participation or interaction.  Problem Solving Problem solving assist level: Solves basic 90% of the time/requires cueing < 10% of the time  Memory Memory assist level: Recognizes or recalls 90% of the time/requires cueing < 10% of the time   ,  L 10/31/2016, 3:50 PM    

## 2016-10-31 NOTE — Plan of Care (Signed)
I met pt and her daughter in the room and hallway. I discussed with daughter about plan for anticoagulation. Daughter mentioned that she has contacted pt PCP Dr. Raylene Everts and so far still has ongoing discussion about risk and benefit of anticoagulation. Daughter is leaning towards anticoagulation at this time with eliquis. Pt has appointment with Dr. Raylene Everts on 11/03/16 and daughter would like further discuss with Dr. Raylene Everts before initiation of anticoagulation. Daughter also requested a CD disc including all pt imaging studies to be burned so that she will bring it to Dr. Raylene Everts for review. I will pass on the information to primary team.   Rosalin Hawking, MD PhD Stroke Neurology 10/31/2016 5:00 PM

## 2016-10-31 NOTE — Progress Notes (Signed)
Social Work Patient ID: [name], female   DOB: 05/26/1941, 76 y.o.   MRN: [id number]  Met with pt and daughter, daughter requested a letter from the MD so pt's niece could get permission to come and visit from [place]. Have given the daughter the letter signed by MD. Daughter completing family education and will be here lat tomorrow to transport pt home after her work day. Home health and equipment arranged for pt.

## 2016-10-31 NOTE — Plan of Care (Signed)
Problem: RH Memory Goal: LTG Patient will use memory compensatory aids to (SLP) LTG:  Patient will use memory compensatory aids to recall biographical/new, daily complex information with cues (SLP)  Outcome: Not Met (add Reason) Supervision cues needed for recall of new information   Problem: RH Awareness Goal: LTG: Patient will demonstrate intellectual/emergent (SLP) LTG: Patient will demonstrate intellectual/emergent/anticipatory awareness with assist during a cognitive/linguistic activity  (SLP)  Outcome: Not Met (add Reason) Supervision needed to recognize and correct errors in the moment

## 2016-11-01 ENCOUNTER — Inpatient Hospital Stay (HOSPITAL_COMMUNITY): Payer: Medicare Other | Admitting: Occupational Therapy

## 2016-11-01 ENCOUNTER — Inpatient Hospital Stay (HOSPITAL_COMMUNITY): Payer: Medicare Other

## 2016-11-01 LAB — GLUCOSE, CAPILLARY
GLUCOSE-CAPILLARY: 100 mg/dL — AB (ref 65–99)
GLUCOSE-CAPILLARY: 114 mg/dL — AB (ref 65–99)
GLUCOSE-CAPILLARY: 146 mg/dL — AB (ref 65–99)
GLUCOSE-CAPILLARY: 97 mg/dL (ref 65–99)
Glucose-Capillary: 115 mg/dL — ABNORMAL HIGH (ref 65–99)

## 2016-11-01 MED ORDER — DARIFENACIN HYDROBROMIDE ER 7.5 MG PO TB24: 7.5000 mg | ORAL_TABLET | Freq: Every day | ORAL | 0 refills | Status: DC

## 2016-11-01 MED ORDER — PANTOPRAZOLE SODIUM 40 MG PO PACK: 40.0000 mg | PACK | Freq: Every day | ORAL | 0 refills | Status: DC

## 2016-11-01 MED ORDER — BUTALBITAL-APAP-CAFFEINE 50-325-40 MG PO TABS: 1.0000 | ORAL_TABLET | Freq: Two times a day (BID) | ORAL | 0 refills | Status: DC | PRN

## 2016-11-01 MED ORDER — METOPROLOL TARTRATE 25 MG PO TABS: 12.5000 mg | ORAL_TABLET | Freq: Two times a day (BID) | ORAL | 0 refills | Status: DC

## 2016-11-01 MED ORDER — ASPIRIN 325 MG PO TABS: 325.0000 mg | ORAL_TABLET | Freq: Every day | ORAL | Status: DC

## 2016-11-01 MED ORDER — PRAVASTATIN SODIUM 10 MG PO TABS: 10.0000 mg | ORAL_TABLET | Freq: Every day | ORAL | 0 refills | Status: DC

## 2016-11-01 NOTE — Progress Notes (Signed)
Social Work  Discharge Note  The overall goal for the admission was met for:   Discharge location: Yes-HOME WITH DAUGHTER AND DAUGHTER'S FAMILY-AWARE NEED TO PROVIDE 24 HR SUPERVISION  Length of Stay: Yes-13 DAYS  Discharge activity level: Yes-SUPERVISION LEVEL  Home/community participation: Yes  Services provided included: MD, RD, PT, OT, SLP, RN, CM, TR, Pharmacy and SW  Financial Services: Medicare and Medicaid  Follow-up services arranged: Home Health: [facility]-PT,OT,SP,RN, DME: [place] and Patient/Family has no preference for HH/DME agencies  Comments (or additional information):DAUGHTER HERE DAILY AND WHEN NOT HAS AN INTERPRETER TO ASSIST WITH HER LANGUAGE BARRIER. DAUGHTER AWARE PT WILL NEED 24 HR SUPERVISION FOR SAFETY  AT HOME. DAUGHTER TO WORK THIS OUT AND HAS PRIVATE DUTY LIST. ALSO GIVEN HER LETTER FOR PT'S NIECE TO COME FROM [place] TO ASSIST FAMILY.  Patient/Family verbalized understanding of follow-up arrangements: Yes  Individual responsible for coordination of the follow-up plan: Mai-DAUGHTER  Confirmed correct DME delivered: Elease Hashimoto 11/01/2016    Elease Hashimoto

## 2016-11-01 NOTE — Plan of Care (Signed)
Problem: RH Car Transfers Goal: LTG Patient will perform car transfers with assist (PT) LTG: Patient will perform car transfers with assistance (PT).  Outcome: Adequate for Discharge Pt requires overall supervision for safety.  Problem: RH Furniture Transfers Goal: LTG Patient will perform furniture transfers w/assist (OT/PT LTG: Patient will perform furniture transfers  with assistance (OT/PT).  Outcome: Adequate for Discharge Pt requires overall supervision due to safety cues. ABG

## 2016-11-01 NOTE — Progress Notes (Signed)
Provided d/c instructions to daughter and pt with letter for family member, and hard scripts. Daughter verbalized understanding of d/c instructions.

## 2016-11-01 NOTE — Discharge Summary (Signed)
Discharge summary job [id number]

## 2016-11-01 NOTE — Progress Notes (Signed)
Occupational Therapy Discharge Summary  Patient Details  Name: [name] MRN: [id number] Date of Birth: November 27, 1940  Today's Date: 11/01/2016 OT Individual Time: 7846-9629 OT Individual Time Calculation (min): 58 min   Session Note:  Pt completed shower, dressing, and grooming during session at modified independent level.  She was able to complete transfer to the shower with supervision and gather clothing with use of the RW and modified independence.  Dressing sit to stand on the shower seat at modified independent level as well including donning bra.  She was able to complete grooming tasks in standing at the sink as well.  Once finished had her complete mobility to the gym for practice on nine hole peg test.  She was able to complete the right in 28 secs and the left in 40 secs.  Pt returned to room at end of session and was left in the wheelchair with call button and phone in reach and safety belt in place.     Patient has met 10 of 10 long term goals due to improved balance, postural control, functional use of  LEFT upper and LEFT lower extremity and improved coordination.  Patient to discharge at overall Supervision level.  Patient's care partner is independent to provide the necessary physical and cognitive assistance at discharge.    Reasons goals not met: NA  Recommendation:  Patient will benefit from ongoing skilled OT services in home health setting to continue to advance functional skills in the area of BADL, iADL and Reduce care partner burden.  Pt is currently modified independent for toileting, bathing, and dressing, tasks sit to stand, but needs supervision for safety secondary to not using the RW correctly at all times.  No LOB noted during sessions with use of the walker but she tends to not reach back for surfaces she is transferring to all the time and does not push up from them when standing.  Feel she will benefit from continued HHOT to reach modified independent level for all  selfcare tasks without use of AD.    Equipment: tub bench, 3:1  Reasons for discharge: treatment goals met and discharge from hospital  Patient/family agrees with progress made and goals achieved: Yes  OT Discharge Precautions/Restrictions Precautions Precautions: Fall Precaution Comments:   Restrictions Weight Bearing Restrictions: No  Pain Pain Assessment Pain Assessment: No/denies pain ADL  See Function section of chart for details  Vision/Perception  Vision- History Baseline Vision/History: Wears glasses Wears Glasses: Reading only Patient Visual Report: No change from baseline Vision- Assessment Vision Assessment?: No apparent visual deficits  Cognition Overall Cognitive Status: Impaired/Different from baseline Arousal/Alertness: Awake/alert Orientation Level: Oriented X4 Attention: Sustained Focused Attention: Appears intact Sustained Attention: Appears intact Memory: Impaired Memory Impairment: Retrieval deficit;Decreased recall of new information Awareness: Impaired Awareness Impairment: Anticipatory impairment Problem Solving: Impaired Problem Solving Impairment: Functional complex Safety/Judgment: Impaired Comments: Pt still needs cueing for walker usage at times as she will turn it to the side and attempt to hold onto it when performing sit to stand and stand to sit transitions.   Sensation Sensation Light Touch: Appears Intact Stereognosis: Appears Intact Hot/Cold: Appears Intact Proprioception: Appears Intact Additional Comments: Sensation intact in BUEs with functional use, but no translator present to assess completely. Coordination Gross Motor Movements are Fluid and Coordinated: No Fine Motor Movements are Fluid and Coordinated: No Coordination and Movement Description: Pt uses the LUE functionally for all selfcare tasks at a slower than normal level.  Increased time needed for fastening bra, but  she was still able to complete.  Motor   Motor Motor: Abnormal postural alignment and control;Hemiplegia Motor - Discharge Observations: Kypohotic posture with slight decreased coordination noted on the left. Mobility  Bed Mobility Bed Mobility: Rolling Right;Supine to Sit;Sit to Supine Rolling Right: 6: Modified independent (Device/Increase time) Rolling Left: 6: Modified independent (Device/Increase time) Supine to Sit: 6: Modified independent (Device/Increase time) Sit to Supine: 6: Modified independent (Device/Increase time) Transfers Transfers: Sit to Stand;Stand to Sit Sit to Stand: 6: Modified independent (Device/Increase time);From toilet;With upper extremity assist Stand to Sit: With armrests;With upper extremity assist;To chair/3-in-1;6: Modified independent (Device/Increase time)  Trunk/Postural Assessment  Cervical Assessment Cervical Assessment: Within Functional Limits Thoracic Assessment Thoracic Assessment: Exceptions to [facility] (thoracic kyphosis present) Lumbar Assessment Lumbar Assessment: Exceptions to [facility] (posterior pelvic tilt in sitting.) Postural Control Postural Control: Within Functional Limits  Balance Balance Balance Assessed: Yes Static Sitting Balance Static Sitting - Balance Support: Feet supported Static Sitting - Level of Assistance: 7: Independent Dynamic Sitting Balance Dynamic Sitting - Balance Support: Feet supported Dynamic Sitting - Level of Assistance: 6: Modified independent (Device/Increase time) Static Standing Balance Static Standing - Balance Support: Right upper extremity supported;Left upper extremity supported;During functional activity Static Standing - Level of Assistance: 6: Modified independent (Device/Increase time) Dynamic Standing Balance Dynamic Standing - Balance Support: During functional activity Dynamic Standing - Level of Assistance: 6: Modified independent (Device/Increase time) Extremity/Trunk Assessment RUE Assessment RUE Assessment: Within Functional  Limits LUE Assessment LUE Assessment: Exceptions to [facility] (AROM WFLs for all joints with strength overall 3+/5 throughout.  She was able to demonstrate slightly slower coordination with finger to nose testing and for 9 hole peg test compared to the right.)   See Function Navigator for Current Functional Status.  Johannah Rozas OTR/L 11/01/2016, 12:29 PM

## 2016-11-01 NOTE — Progress Notes (Signed)
Physical Therapy Session Note  Patient Details  Name: Lauren RumpfHue [name] MRN: [id number] Date of Birth: 11/30/1940  Today's Date: 11/01/2016 PT Individual Time: 0900-1000 PT Individual Time Calculation (min): 60 min    Short Term Goals: Week 2:  PT Short Term Goal 1 (Week 2): STG = LTG due to ELOS   Skilled Therapeutic Interventions/Progress Updates:    Session focused on grad day activities to prepare for discharge and higher level balance training due to increased fall risk. Pt completed mobility at overall supervision level with cues for safety and attention. Pt completed simulated car transfer, gait up/down ramp and over mulched surface to simulate community mobility, stair negotiation for home entry and community access, bed mobility on flat bed for home environment training, and basic transfers and gait with RW at supervision level with cues for positioning of RW and overall safety. Dynamic gait through obstacle course with and without RW  to challenge balance and simulate home and community mobility. Neuro re-ed to address balance re-training with various activities including picking up items of off floor without UE support at close supervision level, dynamic balance reaching activity while on compliant surface, and heel/toe raises and mini squats on foam x 10 reps x 2 sets each, and retro gait, tandem gait, and sidestepping for higher level balance.    Therapy Documentation Precautions:  Precautions Precautions: Fall Precaution Comments: left inattention, mild Restrictions Weight Bearing Restrictions: No   Pain:  No complaints.  See Function Navigator for Current Functional Status.   Therapy/Group: Individual Therapy  Karolee StampsGray, Petro Talent Darrol PokeBrescia  Ambrosio Reuter B. Skylan Lara, PT, DPT  11/01/2016, 10:36 AM

## 2016-11-02 LAB — GLUCOSE, CAPILLARY: GLUCOSE-CAPILLARY: 131 mg/dL — AB (ref 65–99)

## 2016-11-02 NOTE — Discharge Summary (Signed)
[name], [name]                    ACCOUNT NO.:  [id number]  MEDICAL RECORD NO.:  [id number]  LOCATION:  [id number]                        FACILITY:  [facility]  PHYSICIAN:  Erick ColaceAndrew E. Kirsteins, M.D.DATE OF BIRTH:  1941/08/02  DATE OF ADMISSION:  10/19/2016 DATE OF DISCHARGE:  11/01/2016                              DISCHARGE SUMMARY   DISCHARGE DIAGNOSES: 1. Right putamen, caudate and right insular infarct, status post     revascularization, right M1 with thrombectomy. 2. Subcutaneous Lovenox for deep venous thrombosis prophylaxis. 3. Pain management. 4. Dysphagia. 5. Diabetes mellitus, peripheral neuropathy. 6. Atrial fibrillation. 7. Decreased nutritional storage. 8. Urinary frequency.  HISTORY OF PRESENT ILLNESS:  This is a 76 year old right-handed female, history of atrial fibrillation, on no anticoagulation due to history of falls and subdural hematoma.  History was taken from her daughter. Presented October 15, 2016, with left-sided weakness and facial droop. The patient lives with daughter and son-in-law.  Reported to be independent with assistive device prior to admission.  Cranial CT scan showed positive hyperdense right MCA bifurcation suspicious for emergent large vessel occlusion.  No intracranial hemorrhage.  CT, cerebral perfusion scan positive for emergent large vessel occlusion, distal right M1 or right MCA bifurcation.  Cerebral angiogram and underwent complete revascularization of occlusion per Interventional Radiology. MRI of the brain, October 16, 2016, showed acute infarct infecting the right putamen and caudate.  Petechial blood products without frank hematoma.  Mild swelling without shift.  MRA of the head with wide patency of the vessels with recannulization of the right MCA. Echocardiogram with ejection fraction of 70%.  No wall motion abnormalities.  Carotid Dopplers with no ICA stenosis.  Aspirin for CVA prophylaxis initiated.  Subcutaneous Lovenox for DVT  prophylaxis.  Await plan for Eliquis 5 mg b.i.d. 14 days after stroke prevention at the recommendations of Neurology Services.  Modified barium swallow.  Placed on a dysphagia #1 honey-thick liquid diet.  The patient was admitted for comprehensive rehab program.  PAST MEDICAL HISTORY:  See discharge diagnoses.  SOCIAL HISTORY:  Lives with family, independent with assistive device prior to admission.  FUNCTIONAL STATUS:  [facility] was minimal assist, 50 feet, one-person handheld assistance; minimal assist, sit to stand; min-to-mod assist, activities of daily living.  PHYSICAL EXAMINATION:  VITAL SIGNS:  Blood pressure 128/73, pulse 88, temperature 97, respirations 20. GENERAL:  This was an alert female, well developed. HEENT:  Normocephalic.  EOMs intact. CARDIAC:  Irregularly irregular, respiratory effort. LUNGS:  Clear to auscultation.  No respiratory distress.  No wheeze. ABDOMEN:  Soft, nontender.  Good bowel sounds. NEUROLOGIC:  She followed simple commands.  There was a language barrier with her daughter helping provide translation.  REHABILITATION HOSPITAL COURSE:  The patient was admitted to Inpatient Rehab Services with therapies initiated on a 3-hour daily basis, consisting of physical therapy, occupational therapy, speech therapy and rehabilitation nursing.  The following issues were addressed during the patient's rehabilitation stay.  Pertaining to Mrs. Blasius right putamen, caudate and right insular infarct, she had undergone revascularization per Interventional Radiology.  Maintained on subcutaneous Lovenox for DVT prophylaxis.  Aspirin therapy currently for CVA prophylaxis. Current plan  was to follow up with Dr. Junie Bame, November 03, 2016, to discuss possibilities of beginning Eliquis in light of history of subdural hematoma with latest cranial CT scan completed, October 28, 2016, showing no visible subdural hematoma.  Cardiac rate  remained controlled.  No chest pain or shortness of breath.  Her diet was currently a mechanical soft nectar thick liquid, monitoring of hydration.  Blood sugars overall well controlled.  She had been on Glucophage in the past.  This could be resumed at PCP discretion.  Bouts of urinary retention.  Urine study negative.  She was placed on Enablex with good results.  The patient received weekly collaborative interdisciplinary team conferences to discuss estimated length of stay, family teaching, any barriers to discharge.  She ambulated to her room from the therapy station with a rolling walker.  She could gather belongings for activities of daily living and homemaking, full assistance of her daughter.  Ongoing sessions held at daughter's request with home management and kitchen tasks, using a rolling walker.  She could use a rolling walker to ambulate around the ADL apartment to straighten up her bed and replace pillows.  Speech therapy ongoing for her dysphagia as well as the patient was able to communicate her needs with language barrier provided by her daughter.  Full family teaching was completed and plan discharge to home.  DISCHARGE MEDICATIONS AT DICTATION:  Included: 1. Aspirin 325 mg p.o. daily. 2. Enablex 7.5 mg p.o. at bedtime. 3. Lopressor 12.5 mg p.o. b.i.d. 4. Protonix 40 mg p.o. daily. 5. Fioricet 50-325-40 mg one tablet every 12 hours as needed,     headache.  DIET:  Her diet was a mechanical soft nectar thick liquid.  FOLLOWUP:  The patient would follow up with Dr. Claudette Laws at the Outpatient Rehab Service as directed; Dr. Roda Shutters, Neurology Services office to call for appointment; Dr. [name] to discuss possibilities of beginning Eliquis with appointment scheduled, November 03, 2016.     Mariam Dollar, P.A.   ______________________________ Erick Colace, M.D.   DA/MEDQ  D:  11/01/2016  T:  11/02/2016  Job:  [id number]  cc:   Marvel Plan,  M.D. Inocencio Homes. Junie Bame, M.D.

## 2016-11-04 ENCOUNTER — Telehealth: Payer: Self-pay

## 2016-11-04 NOTE — Telephone Encounter (Signed)
Transitional Care call  Patient name: [name], [name]   DOB: 03/13/1941 1. Are you/is patient experiencing any problems since coming home? NO a. Are there any questions regarding any aspect of care? NO 2. Are there any questions regarding medications administration/dosing? NO  a. Are meds being taken as prescribed? YES b. "Patient should review meds with caller to confirm" 3. Have there been any falls? NO 4. Has Home Health been to the house and/or have they contacted you? YES a. If not, have you tried to contact them? NO b. Can we help you contact them? NO 5. Are bowels and bladder emptying properly? NO - CONSTIPATED a. Are there any unexpected incontinence issues? NO b. If applicable, is patient following bowel/bladder programs? NA 6. Any fevers, problems with breathing, unexpected pain? NO 7. Are there any skin problems or new areas of breakdown? NO 8. Has the patient/family member arranged specialty MD follow up (ie cardiology/neurology/renal/surgical/etc.)?  YES a. Can we help arrange? NO 9. Does the patient need any other services or support that we can help arrange?  NO 10. Are caregivers following through as expected in assisting the patient? YES 11. Has the patient quit smoking, drinking alcohol, or using drugs as recommended? NO  Appointment time 12:00pm, arrive time 11:45am and who it is with here Dr. Wynn BankerKirsteins [address]

## 2016-11-08 ENCOUNTER — Ambulatory Visit (HOSPITAL_BASED_OUTPATIENT_CLINIC_OR_DEPARTMENT_OTHER): Payer: Medicare Other | Admitting: Physical Medicine & Rehabilitation

## 2016-11-08 ENCOUNTER — Encounter: Payer: Medicare Other | Attending: Physical Medicine & Rehabilitation

## 2016-11-08 ENCOUNTER — Encounter: Payer: Self-pay | Admitting: Physical Medicine & Rehabilitation

## 2016-11-08 VITALS — BP 120/75 | HR 84

## 2016-11-08 DIAGNOSIS — I4891 Unspecified atrial fibrillation: Secondary | ICD-10-CM | POA: Diagnosis not present

## 2016-11-08 DIAGNOSIS — I482 Chronic atrial fibrillation, unspecified: Secondary | ICD-10-CM

## 2016-11-08 DIAGNOSIS — E1142 Type 2 diabetes mellitus with diabetic polyneuropathy: Secondary | ICD-10-CM

## 2016-11-08 DIAGNOSIS — R269 Unspecified abnormalities of gait and mobility: Secondary | ICD-10-CM

## 2016-11-08 DIAGNOSIS — Z90710 Acquired absence of both cervix and uterus: Secondary | ICD-10-CM | POA: Insufficient documentation

## 2016-11-08 DIAGNOSIS — I69398 Other sequelae of cerebral infarction: Secondary | ICD-10-CM

## 2016-11-08 DIAGNOSIS — I69354 Hemiplegia and hemiparesis following cerebral infarction affecting left non-dominant side: Secondary | ICD-10-CM

## 2016-11-08 DIAGNOSIS — K219 Gastro-esophageal reflux disease without esophagitis: Secondary | ICD-10-CM | POA: Diagnosis not present

## 2016-11-08 DIAGNOSIS — G8929 Other chronic pain: Secondary | ICD-10-CM | POA: Diagnosis not present

## 2016-11-08 DIAGNOSIS — Z9889 Other specified postprocedural states: Secondary | ICD-10-CM | POA: Insufficient documentation

## 2016-11-08 DIAGNOSIS — Z7722 Contact with and (suspected) exposure to environmental tobacco smoke (acute) (chronic): Secondary | ICD-10-CM | POA: Diagnosis not present

## 2016-11-08 DIAGNOSIS — G4733 Obstructive sleep apnea (adult) (pediatric): Secondary | ICD-10-CM | POA: Insufficient documentation

## 2016-11-08 DIAGNOSIS — M170 Bilateral primary osteoarthritis of knee: Secondary | ICD-10-CM | POA: Diagnosis not present

## 2016-11-08 DIAGNOSIS — Z7984 Long term (current) use of oral hypoglycemic drugs: Secondary | ICD-10-CM | POA: Insufficient documentation

## 2016-11-08 MED ORDER — DICLOFENAC SODIUM 1 % TD GEL: 2.0000 g | Freq: Four times a day (QID) | TRANSDERMAL | 2 refills | Status: DC

## 2016-11-08 NOTE — Patient Instructions (Signed)
Please do her home exercise program. I have prescribed Voltaren gel for your knee pain. Please apply it 3 or 4 times per day

## 2016-11-08 NOTE — Progress Notes (Signed)
Subjective:    Patient ID: [name], female    DOB: [birth year], 76 y.o.   MRN: [id number] 76 year old right-handed female, history of atrial fibrillation, on no anticoagulation due to history of falls and subdural hematoma.  History was taken from her daughter. Presented October 15, 2016, with left-sided weakness and facial droop. The patient lives with daughter and son-in-law.  Reported to be independent with assistive device prior to admission.  Cranial CT scan showed positive hyperdense right MCA bifurcation suspicious for emergent large vessel occlusion.  No intracranial hemorrhage.  CT, cerebral perfusion scan positive for emergent large vessel occlusion, distal right M1 or right MCA bifurcation.  Cerebral angiogram and underwent complete revascularization of occlusion per Interventional Radiology. MRI of the brain, October 16, 2016, showed acute infarct infecting the right putamen and caudate.  Petechial blood products without frank hematoma.  Mild swelling without shift.  MRA of the head with wide patency of the vessels with recannulization of the right MCA. Echocardiogram with ejection fraction of 70%.  No wall motion abnormalities.  Carotid Dopplers with no ICA stenosis.  Aspirin for CVA prophylaxis initiated.  Subcutaneous Lovenox for DVT prophylaxis.  Await plan for Eliquis 5 mg b.i.d. 14 days after stroke prevention at the recommendations of Neurology Services.   HPI  Home Health PT, Speech  RN, has seen pt but not seen by OT yet No falls a home, Still requires help with dressing and bathing Walks independantly with walker , sometimes makes unsafe moves. Prior to CVA has had chronic knee pain Used topical medication for pain.  Using fioricet for headache pain, takes this less than daily PCP appt cancelled due to snow.    Pain Inventory Average Pain 7 Pain Right Now 6 My pain is constant, dull, tingling and aching  In the last 24 hours, has pain interfered with  the following? General activity 4 Relation with others 5 Enjoyment of life 5 What TIME of day is your pain at its worst? evening Sleep (in general) Good  Pain is worse with: walking and sitting Pain improves with: rest, heat/ice and medication Relief from Meds: 5  Mobility walk with assistance use a walker ability to climb steps?  yes needs help with transfers  Function retired I need assistance with the following:  dressing, bathing, meal prep, household duties and shopping  Neuro/Psych bladder control problems weakness numbness tingling trouble walking anxiety loss of taste or smell  Prior Studies Any changes since last visit?  no  Physicians involved in your care Any changes since last visit?  no   Family History  Problem Relation Age of Onset  . Family history unknown: Yes   Social History   Social History  . Marital status: Widowed    Spouse name: N/A  . Number of children: N/A  . Years of education: N/A   Social History Main Topics  . Smoking status: Passive Smoke Exposure - Never Smoker  . Smokeless tobacco: Never Used  . Alcohol use No  . Drug use: No  . Sexual activity: No   Other Topics Concern  . Not on file   Social History Narrative  . No narrative on file   Past Surgical History:  Procedure Laterality Date  . APPENDECTOMY  2012  . CATARACT EXTRACTION W/ INTRAOCULAR LENS  IMPLANT, BILATERAL Bilateral 2000's  . IR GENERIC HISTORICAL  10/15/2016   IR PERCUTANEOUS ART THROMBECTOMY/INFUSION INTRACRANIAL INC DIAG ANGIO 10/15/2016 [name], MD [facility]  . RADIOLOGY WITH ANESTHESIA N/A  10/15/2016   Procedure: RADIOLOGY WITH ANESTHESIA;  Surgeon: Julieanne CottonSanjeev Deveshwar, MD;  Location: [facility] OR;  Service: Radiology;  Laterality: N/A;  . TOTAL ABDOMINAL HYSTERECTOMY  1990   Past Medical History:  Diagnosis Date  . Atrial fibrillation (HCC)   . Chronic back pain    "mid back down into lower back" (08/25/2014)  . GERD  (gastroesophageal reflux disease)   . Hypertriglyceridemia   . OSA on CPAP   . Osteoarthritis    "knees, hands, back" (08/25/2014)  . Pneumonia ~ 2000 X 1  . Subdural hematoma (HCC) july 2015   S/P fall while on Coumadin  . T12 compression fracture (HCC) 2012  . Type II diabetes mellitus (HCC)    There were no vitals taken for this visit.  Opioid Risk Score:   Fall Risk Score:  `1  Depression screen PHQ 2/9  No flowsheet data found. Review of Systems  HENT: Negative.   Eyes: Negative.   Respiratory: Negative.   Cardiovascular: Negative.   Gastrointestinal: Positive for constipation.  Endocrine: Negative.   Genitourinary: Positive for difficulty urinating.  Musculoskeletal: Positive for back pain and gait problem.  Skin: Negative.   Allergic/Immunologic: Negative.   Neurological: Positive for dizziness, weakness and numbness.       Tingling  Hematological: Negative.   Psychiatric/Behavioral: The patient is nervous/anxious.   All other systems reviewed and are negative.      Objective:   Physical Exam  Constitutional: She appears well-developed and well-nourished.  HENT:  Head: Normocephalic and atraumatic.  Eyes: Conjunctivae and EOM are normal. Pupils are equal, round, and reactive to light.  Neck: Normal range of motion.  Cardiovascular: Normal rate and normal heart sounds.  An irregularly irregular rhythm present.  No murmur heard. Pulmonary/Chest: Breath sounds normal. No respiratory distress.  Abdominal: Soft. Bowel sounds are normal. She exhibits no distension. There is no tenderness.  Nursing note and vitals reviewed.  Motor strength is 4/5 in the left deltoid, bicep, tricep, grip 4/5 in the left hip flexor, knee extensor, ankle dorsiflexor, plantar flexor Right side, 5/5 deltoid, biceps, triceps, grip, hip flexor, knee extensor is Sensation mildly reduced light touch left upper Gait is with hand-held assistance short distances wide base support, minimal  assist No evidence of bilateral knee effusion, no pain with range of motion      Assessment & Plan:  1. 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  2. AFibrillation, follow-up primary care physician. Continue , Eliquis for anticoagulation  3.  Diabetes with peripheral polyneuropathy, cont metformin, f/u PCP  4. Knee pain, bilateral due to osteoarthritis. Trial of Voltaren gel

## 2016-11-24 ENCOUNTER — Telehealth: Payer: Self-pay | Admitting: *Deleted

## 2016-11-24 NOTE — Telephone Encounter (Signed)
Allen DerryAllison Mallory, [facility], [facility] left a message asking for verbal orders to address swallowing 2week1 followed by 1week2. Verbal orders given per office protocol.

## 2016-11-29 ENCOUNTER — Ambulatory Visit (HOSPITAL_COMMUNITY): Payer: Medicare Other

## 2016-11-29 ENCOUNTER — Telehealth: Payer: Self-pay

## 2016-11-29 NOTE — Telephone Encounter (Signed)
Lauren MessierKathy PT called stating that [facility] this [facility] and is requesting an extension of orders for gait training, transfer safety, and general mobility.

## 2016-11-30 NOTE — Telephone Encounter (Signed)
Approval given

## 2016-12-08 ENCOUNTER — Ambulatory Visit (HOSPITAL_BASED_OUTPATIENT_CLINIC_OR_DEPARTMENT_OTHER): Payer: Medicare Other | Admitting: Physical Medicine & Rehabilitation

## 2016-12-08 ENCOUNTER — Encounter: Payer: Medicare Other | Attending: Physical Medicine & Rehabilitation

## 2016-12-08 VITALS — BP 120/76 | HR 96

## 2016-12-08 DIAGNOSIS — M1712 Unilateral primary osteoarthritis, left knee: Secondary | ICD-10-CM | POA: Diagnosis not present

## 2016-12-08 DIAGNOSIS — I4891 Unspecified atrial fibrillation: Secondary | ICD-10-CM | POA: Insufficient documentation

## 2016-12-08 DIAGNOSIS — Z9889 Other specified postprocedural states: Secondary | ICD-10-CM | POA: Insufficient documentation

## 2016-12-08 DIAGNOSIS — E1142 Type 2 diabetes mellitus with diabetic polyneuropathy: Secondary | ICD-10-CM | POA: Diagnosis not present

## 2016-12-08 DIAGNOSIS — M170 Bilateral primary osteoarthritis of knee: Secondary | ICD-10-CM | POA: Insufficient documentation

## 2016-12-08 DIAGNOSIS — I639 Cerebral infarction, unspecified: Secondary | ICD-10-CM

## 2016-12-08 DIAGNOSIS — G4733 Obstructive sleep apnea (adult) (pediatric): Secondary | ICD-10-CM | POA: Insufficient documentation

## 2016-12-08 DIAGNOSIS — G8929 Other chronic pain: Secondary | ICD-10-CM | POA: Insufficient documentation

## 2016-12-08 DIAGNOSIS — K219 Gastro-esophageal reflux disease without esophagitis: Secondary | ICD-10-CM | POA: Insufficient documentation

## 2016-12-08 DIAGNOSIS — Z7984 Long term (current) use of oral hypoglycemic drugs: Secondary | ICD-10-CM | POA: Insufficient documentation

## 2016-12-08 DIAGNOSIS — Z7722 Contact with and (suspected) exposure to environmental tobacco smoke (acute) (chronic): Secondary | ICD-10-CM | POA: Insufficient documentation

## 2016-12-08 DIAGNOSIS — I69354 Hemiplegia and hemiparesis following cerebral infarction affecting left non-dominant side: Secondary | ICD-10-CM | POA: Diagnosis not present

## 2016-12-08 DIAGNOSIS — Z90710 Acquired absence of both cervix and uterus: Secondary | ICD-10-CM | POA: Diagnosis not present

## 2016-12-08 NOTE — Progress Notes (Signed)
Subjective:    Patient ID: [name], female    DOB: 10/27/1940, 76 y.o.   MRN: [id number] 76 year old right-handed female, history of atrial fibrillation, on no anticoagulation due to history of falls and subdural hematoma.  History was taken from her daughter. Presented October 15, 2016, with left-sided weakness and facial droop. The patient lives with daughter and son-in-law.  Reported to be independent with assistive device prior to admission.  Cranial CT scan showed positive hyperdense right MCA bifurcation suspicious for emergent large vessel occlusion.  No intracranial hemorrhage.  CT, cerebral perfusion scan positive for emergent large vessel occlusion, distal right M1 or right MCA bifurcation.  Cerebral angiogram and underwent complete revascularization of occlusion per Interventional Radiology. MRI of the brain, October 16, 2016, showed acute infarct infecting the right putamen and caudate.  Petechial blood products without frank hematoma.  Mild swelling without shift.  MRA of the head with wide patency of the vessels with recannulization of the right MCA. Echocardiogram with ejection fraction of 70%.  No wall motion abnormalities.  Carotid Dopplers with no ICA stenosis. HPI Still requires walker without sup also using cane with min G Is able to dress and bathe. Left lower extremity still feels like it will give out at times. This is related to both pain and weakness Daughter gives a history of seeing an orthopedic surgeon who performed Synvisc injections, these were helpful for up to one year Pain Inventory Average Pain 4 Pain Right Now 5 My pain is stabbing and aching  In the last 24 hours, has pain interfered with the following? General activity 4 Relation with others 2 Enjoyment of life 4 What TIME of day is your pain at its worst? night Sleep (in general) Fair  Pain is worse with: walking, sitting and standing Pain improves with: rest and medication Relief  from Meds: 5  Mobility walk with assistance use a cane use a walker how many minutes can you walk? 5 ability to climb steps?  no do you drive?  no needs help with transfers Do you have any goals in this area?  yes  Function retired  Neuro/Psych bladder control problems weakness numbness tingling dizziness loss of taste or smell  Prior Studies Any changes since last visit?  no  Physicians involved in your care Any changes since last visit?  no   Family History  Problem Relation Age of Onset  . Family history unknown: Yes   Social History   Social History  . Marital status: Widowed    Spouse name: N/A  . Number of children: N/A  . Years of education: N/A   Social History Main Topics  . Smoking status: Passive Smoke Exposure - Never Smoker  . Smokeless tobacco: Never Used  . Alcohol use No  . Drug use: No  . Sexual activity: No   Other Topics Concern  . Not on file   Social History Narrative  . No narrative on file   Past Surgical History:  Procedure Laterality Date  . APPENDECTOMY  2012  . CATARACT EXTRACTION W/ INTRAOCULAR LENS  IMPLANT, BILATERAL Bilateral 2000's  . IR GENERIC HISTORICAL  10/15/2016   IR PERCUTANEOUS ART THROMBECTOMY/INFUSION INTRACRANIAL INC DIAG ANGIO 10/15/2016 Julieanne CottonSanjeev Deveshwar, MD [facility]  . RADIOLOGY WITH ANESTHESIA N/A 10/15/2016   Procedure: RADIOLOGY WITH ANESTHESIA;  Surgeon: Julieanne CottonSanjeev Deveshwar, MD;  Location: [facility];  Service: Radiology;  Laterality: N/A;  . TOTAL ABDOMINAL HYSTERECTOMY  1990   Past Medical History:  Diagnosis Date  .  Atrial fibrillation (HCC)   . Chronic back pain    "mid back down into lower back" (08/25/2014)  . GERD (gastroesophageal reflux disease)   . Hypertriglyceridemia   . OSA on CPAP   . Osteoarthritis    "knees, hands, back" (08/25/2014)  . Pneumonia ~ 2000 X 1  . Subdural hematoma (HCC) july 2015   S/P fall while on Coumadin  . T12 compression fracture (HCC) 2012  . Type II  diabetes mellitus (HCC)    BP 120/76   Pulse 96   SpO2 96%   Opioid Risk Score:   Fall Risk Score:  `1  Depression screen PHQ 2/9  Depression screen PHQ 2/9 11/08/2016  Decreased Interest 3  Down, Depressed, Hopeless 1  PHQ - 2 Score 4  Altered sleeping 3  Tired, decreased energy 3  Change in appetite 0  Feeling bad or failure about yourself  1  Trouble concentrating 1  Moving slowly or fidgety/restless 3  Suicidal thoughts 0  PHQ-9 Score 15  Difficult doing work/chores Somewhat difficult    Review of Systems  HENT: Negative.   Eyes: Negative.   Respiratory: Positive for shortness of breath.   Cardiovascular: Negative.   Gastrointestinal: Negative.   Endocrine: Negative.   Genitourinary: Positive for frequency and urgency.  Musculoskeletal: Positive for arthralgias, back pain and gait problem.  Skin: Negative.   Allergic/Immunologic: Negative.   Neurological: Positive for weakness and numbness.       Tingling  Hematological: Negative.   Psychiatric/Behavioral: Negative.   All other systems reviewed and are negative.      Objective:   Physical Exam  Constitutional: She appears well-developed and well-nourished.  HENT:  Head: Normocephalic and atraumatic.  Eyes: Conjunctivae are normal. Pupils are equal, round, and reactive to light.  Neck: Normal range of motion.  Musculoskeletal:       Left knee: She exhibits normal range of motion, no swelling, no effusion and no deformity. Tenderness found. Medial joint line tenderness noted.  Neurological: She is alert.  Motor strength is 5/5 in the right deltoid, biceps, triceps, grip, hip flexor, knee extensor, ankle dorsiflexor. 4/5 in the left deltoid, biceps, triceps, grip, hip flexor, knee extensor, dorsiflexor.   Psychiatric: She has a normal mood and affect. Her behavior is normal.  Nursing note and vitals reviewed.         Assessment & Plan:  1.  Right MCA infarct, status post revascularization. Has  residual left hemiparesis as well as gait disorder. Would continue home health PT, OT and then switch to outpatient  2. Left greater than right knee pain. History of arthritis, has been evaluated by orthopedic surgery. Has responded well to Synvisc injections in the past. We'll schedule for left knee, evaluate result and then determine whether right knee needs to be injected as well  Discussed with patient and daughter agree with plan

## 2016-12-08 NOTE — Patient Instructions (Signed)
Sodium Hyaluronate intra-articular injection ?y l thu?c g? SODIUM HYALURONATE ???c dng ?? ?i?u tr? ?au trong ??u g?i do b?nh vim x??ng kh?p. Thu?c c?ng c th? ???c dng trong gi?i ph?u m?t. (CC) NHN HI?U PH? BI?N: Amvisc, Euflexxa, GELSYN-3, Hyalgan, Monovisc, Orthovisc, Supartz, Supartz FX Ti c?n ph?i bo cho ng??i cung c?p d?ch v? y t? c?a mnh ?i?u g tr??c khi dng thu?c ny? H? c?n bi?t li?u qu v? c b?t k? tnh tr?ng no sau ?y khng: -cc v?n ?? v? ch?y mu -b?nh t?ng nhn p -nhi?m trng trong kh?p g?i -cc tnh tr?ng b?nh l ho?c m?n c?m c?a da -nhi?m trng da -pha?n ??ng b?t th???ng ho??c di? ??ng v??i sodium hyaluronate -pha?n ??ng b?t th???ng ho??c di? ??ng v??i ca?c d??c ph?m kha?c -pha?n ??ng b?t th???ng ho??c di? ??ng v??i th??c ph?m, thu?c nhu?m, ho??c ch?t ba?o qua?n. Cc th??ng ph?m khc nhau c?a Sodium hyaluronate c ch?a cc ch?t gy d? ?ng khc nhau. M?t s? lo?i c th? c ch?a tr?ng. Hy ni v?i bc s? v? cc ch?ng d? ?ng c?a mnh ?? b?o ??m r?ng qu v? s? nh?n ???c ?ng lo?i ch? ph?m. -?ang c thai ho??c ??nh co? thai -?ang cho con bu? Ti nn s? d?ng thu?c ny nh? th? no? Thu?c ny ?? tim vo kh??p g?i. Thu?c ny ???c s? d?ng b?i chuyn vin y t? ? b?nh vi?n ho?c ? phng m?ch. Hy bn v?i bc s? nhi khoa c?a qu v? v? vi?c dng thu?c ny ? tr? em. C th? c?n ch?m Rose ??c bi?t. N?u ti l? qun m?t li?u th sao? ?i?u ny khng p d?ng. Nh?ng g c th? t??ng tc v?i thu?c ny? Cc t??ng tc v?i thu?c khng x?y ra. Ti c?n ph?i theo di ?i?u g trong khi dng thu?c ny? Hy bo cho bc s? ho?c chuyn vin y t?, n?u cc tri?u ch?ng c?a qu v? khng kh h?n, ho?c tr? nn n?ng h?n. N?u qu v? ???c tim thu?c ny ?? tr? b?nh vim x??ng kh?p, th hy h?n ch? ho?t ??ng c?a mnh sau khi tim. Young Berryrnh ho?t ??ng th? ch?t trong 48 gi? ??ng h? sau khi tim ?? gi? cho ??u g?i qu v? kh?i b? s?ng. Trong vng 48 gi? ??ng h? sau khi tim, khng ???c ??ng lu h?n 1 gi?  lin t?c m?i l?n. Hy h?i bc s? ho?c chuyn vin y t? c?a mnh xem khi no th qu v? c th? b?t ??u l?i cc ho?t ??ng l?n v? th? ch?t. Ti c th? nh?n th?y nh?ng tc d?ng ph? no khi dng thu?c ny? Nh?ng tc d?ng ph? qu v? c?n ph?i bo cho bc s? ho?c chuyn vin y t? cng s?m cng t?t: -cc ph?n ?ng d? ?ng, ch?ng h?n nh? da b? m?n ??, ng?a, n?i my ?ay, s?ng ? m?t, mi, ho?c l??i -chng m?t -ph?ng m?t -?au, t ho?c c?m gic nh? b? ki?n b ? bn tay ho?c bn chn -cc thay ??i th? l?c, n?u c dng thu?c ny trong khi gi?i ph?u m?t Cc tc d?ng ph? khng c?n ph?i ch?m [place] y t? (hy bo cho bc s? ho?c chuyn vin y t?, n?u cc tc d?ng ph? ny ti?p di?n ho?c gy phi?n toi): -?au l?ng -b?m ti?m ? ch? tim -?n l?nh -tiu ch?y -s?t -?au ??u -?au ho?c nh?c kh?p -b? c?ng kh?p -cc kh?p b? s?ng -co th?t c? b?p ho?c v?p b? -?au ho?c nh?c c? b?p -bu?n i ho?c i m?a -?au, ??,  ng?a, ho?c kch ?ng ? ch? tim -m?t m?i ho?c y?u ?t Ti nn c?t gi? thu?c c?a mnh ? ?u? Thu?c ny ???c s? d?ng b?i chuyn vin y t? ? b?nh vi?n ho?c ? phng m?ch. Qu v? s? khng ???c c?p thu?c ny ?? c?t gi? t?i nh.  2017 [employer] (2016-01-18 00:00:00)

## 2016-12-13 ENCOUNTER — Encounter (HOSPITAL_COMMUNITY): Payer: Self-pay | Admitting: Radiology

## 2016-12-13 ENCOUNTER — Ambulatory Visit (HOSPITAL_COMMUNITY)
Admission: RE | Admit: 2016-12-13 | Discharge: 2016-12-13 | Disposition: A | Discharge status: Home / Self Care | Payer: Medicare Other | Source: Ambulatory Visit | Attending: Radiology | Admitting: Radiology

## 2016-12-13 DIAGNOSIS — I639 Cerebral infarction, unspecified: Secondary | ICD-10-CM

## 2016-12-13 HISTORY — PX: IR GENERIC HISTORICAL: IMG1180011

## 2016-12-16 ENCOUNTER — Telehealth: Payer: Self-pay

## 2016-12-16 NOTE — Telephone Encounter (Signed)
Revonda Standardllison ST was Calling to extend orders for additional 2 weeks, working on elevating patient from honey thick diet,  states she did have a fall trying to go to bathroom when she forgot her walker, now has soreness in mid-back and right arm.

## 2016-12-20 ENCOUNTER — Encounter: Payer: Self-pay | Admitting: Diagnostic Neuroimaging

## 2016-12-20 ENCOUNTER — Ambulatory Visit (INDEPENDENT_AMBULATORY_CARE_PROVIDER_SITE_OTHER): Payer: Medicare Other | Admitting: Diagnostic Neuroimaging

## 2016-12-20 VITALS — BP 106/66 | HR 68 | Ht <= 58 in | Wt 134.8 lb

## 2016-12-20 DIAGNOSIS — I482 Chronic atrial fibrillation, unspecified: Secondary | ICD-10-CM

## 2016-12-20 DIAGNOSIS — I69398 Other sequelae of cerebral infarction: Secondary | ICD-10-CM

## 2016-12-20 DIAGNOSIS — I63411 Cerebral infarction due to embolism of right middle cerebral artery: Secondary | ICD-10-CM | POA: Diagnosis not present

## 2016-12-20 DIAGNOSIS — R269 Unspecified abnormalities of gait and mobility: Secondary | ICD-10-CM

## 2016-12-20 DIAGNOSIS — E1142 Type 2 diabetes mellitus with diabetic polyneuropathy: Secondary | ICD-10-CM

## 2016-12-20 DIAGNOSIS — R350 Frequency of micturition: Secondary | ICD-10-CM | POA: Diagnosis not present

## 2016-12-20 DIAGNOSIS — I639 Cerebral infarction, unspecified: Secondary | ICD-10-CM

## 2016-12-20 NOTE — Patient Instructions (Signed)
Thank you for coming to see us at [facility]. I hope we have been able to provide you high quality care today.  You may receive a patient satisfaction survey over the next few weeks. We would appreciate your feedback and comments so that we may continue to improve ourselves and the health of our patients.  - continue current medications   ~~~~~~~~~~~~~~~~~~~~~~~~~~~~~~~~~~~~~~~~~~~~~~~~~~~~~~~~~~~~~~~~~  DR. Haidan Nhan'S GUIDE TO HAPPY AND HEALTHY LIVING These are some of my general health and wellness recommendations. Some of them may apply to you better than others. Please use common sense as you try these suggestions and feel free to ask me any questions.   ACTIVITY/FITNESS Mental, social, emotional and physical stimulation are very important for brain and body health. Try learning a new activity (arts, music, language, sports, games).  Keep moving your body to the best of your abilities.    NUTRITION Eat more plants: colorful vegetables, nuts, seeds and berries.  Eat less sugar, salt, preservatives and processed foods.  Avoid toxins such as cigarettes and alcohol.  Drink water when you are thirsty. Warm water with a slice of lemon is an excellent morning drink to start the day.  Consider these websites for more information The Nutrition Source (https://www.hsph.harvard.edu/nutritionsource) Precision Nutrition (www.precisionnutrition.com/blog/infographics)   RELAXATION Consider practicing mindfulness meditation or other relaxation techniques such as deep breathing, prayer, yoga, tai chi, massage. See website mindful.org or the apps Headspace or Calm to help get started.   SLEEP Try to get at least 7-8+ hours sleep per day. Regular exercise and reduced caffeine will help you sleep better. Practice good sleep hygeine techniques. See website sleep.org for more information.   PLANNING Prepare estate planning, living will, healthcare POA documents. Sometimes  this is best planned with the help of an attorney. Theconversationproject.org and agingwithdignity.org are excellent resources.  

## 2016-12-20 NOTE — Progress Notes (Signed)
[facility]  PATIENT: [name] DOB: 1941/10/07  REFERRING CLINICIAN: Xu / stroke team HISTORY FROM: follow up REASON FOR VISIT: new consult    HISTORICAL  CHIEF COMPLAINT:  Chief Complaint  Patient presents with  . Cerebrovascular Accident    rm 6, dgtr/interpreter- [facility], "currently receiving PT at home"  . Follow-up    hospital FU    HISTORY OF PRESENT ILLNESS:   HPI: 76 year old female here for evaluation and stroke hospital stay follow-up. Patient had right brain ischemic infarction, status post stroke neuroradiology intervention and right M1 segment revascularization. Patient has been doing well for physical therapy. However last couple weeks patient's daughter has noted that patient having more depression type symptoms, decreased motivation, disinterest in activities. No focal symptoms noted. Patient having him issues with frequent urination. Patient also reporting some pain in left knee.  PRIOR HPI (Dr. Roda Shutters, 10/19/16): "[name] an 76 y.o.femalewho presents with acute onset of left facial droop and left hemiplegia.She had symptoms of fatiguethis morning and then went to rest on her bed at 0900. At 0930 her family noted left arm/leg weakness and facial droop. EMS was called and she was transported to [facility] ED as a Code Stroke. EMS also noted rightward gaze deviation. She has a history of atrial fibrillation and was on a blood thinner, but this was discontinued after she fell and sustained a subdural bleed.On ASA at home.Does not speak [place]. She is ethnic [place] and emigrated from [place]. She speaks both [place] and [place] She was LKW at 8:30 AM on 10/15/2016. tPA was not given due to prior history of subdural hemorrhage. NIHSS:18. She was taken to IR where Dr. Corliss Skains performed a RT common carotid arteriogram followed by complete revascularization of occluded RT MCA M1 segmentwith x1 pass with the solitaire retrieval device and 1.8 mg of  superselective intracranial IA Integrelin with achievement of a TICI 3 reperfusion. She was admitted to neuro ICU for further evaluation and treatment.  Disposition:  Transfer to [facility] for ongoing PT, OT and ST  aspirin 325 mg daily for secondary stroke prevention now  On 10/30/2016 start eliquis 5 mg bid for secondary stroke prevention, stop aspirin  Recommend ongoing risk factor control by Primary Care Physician at time of discharge from inpatient rehabilitation.  Follow-up MILLMAN,FRANKLYN, MD in 2 weeks following discharge from rehab.  Follow-up with Dr. Marvel Plan, Stroke Clinic in 6 weeks, office to schedule an appointment."      REVIEW OF SYSTEMS: Full 14 system review of systems performed and negative with exception of: Fatigue memory loss confusion sleepiness snoring decreased energy aching muscles joint pain incontinence shortness of breath.  ALLERGIES: Allergies  Allergen Reactions  . Lactose Intolerance (Gi) Diarrhea  . Banana Swelling    Hands and feet  . Peanut-Containing Drug Products Itching    Throat itches, no swelling  . Chocolate Itching    HOME MEDICATIONS: Outpatient Medications Prior to Visit  Medication Sig Dispense Refill  . albuterol (ACCUNEB) 0.63 MG/3ML nebulizer solution Take 0.63 mg by nebulization every 6 (six) hours as needed. When unable to use inaler    . alendronate (FOSAMAX) 70 MG tablet Take 70 mg by mouth every Monday.     . diclofenac sodium (VOLTAREN) 1 % GEL Apply 2 g topically 4 (four) times daily. 3 Tube 2  . ELIQUIS 5 MG TABS tablet     . fluticasone (FLONASE) 50 MCG/ACT nasal spray Place 1 spray into both nostrils daily as  needed for allergies.     . fluticasone (FLOVENT HFA) 110 MCG/ACT inhaler Inhale 1 puff into the lungs 2 (two) times daily.    [name] levalbuterol (XOPENEX HFA) 45 MCG/ACT inhaler Inhale 1 puff into the lungs every 4 (four) hours as needed.    . metoprolol tartrate (LOPRESSOR) 25 MG tablet Take 0.5  tablets (12.5 mg total) by mouth 2 (two) times daily. 60 tablet 0  . pravastatin (PRAVACHOL) 10 MG tablet Take 1 tablet (10 mg total) by mouth daily. 30 tablet 0  . metFORMIN (GLUCOPHAGE-XR) 500 MG 24 hr tablet     . b complex vitamins tablet Take 1 tablet by mouth daily.    . butalbital-acetaminophen-caffeine (FIORICET, ESGIC) 50-325-40 MG tablet Take 1 tablet by mouth every 12 (twelve) hours as needed for headache. (Patient not taking: Reported on 12/20/2016) 20 tablet 0   No facility-administered medications prior to visit.     PAST MEDICAL HISTORY: Past Medical History:  Diagnosis Date  . Atrial fibrillation (HCC)   . Chronic back pain    "mid back down into lower back" (08/25/2014)  . Frequent falls   . GERD (gastroesophageal reflux disease)   . Hypertriglyceridemia   . OSA on CPAP   . Osteoarthritis    "knees, hands, back" (08/25/2014)  . Pneumonia ~ 2000 X 1  . Subdural hematoma (HCC) july 2015   S/P fall while on Coumadin  . T12 compression fracture (HCC) 2012  . Type II diabetes mellitus (HCC)     PAST SURGICAL HISTORY: Past Surgical History:  Procedure Laterality Date  . APPENDECTOMY  2012  . CATARACT EXTRACTION W/ INTRAOCULAR LENS  IMPLANT, BILATERAL Bilateral 2000's  . IR GENERIC HISTORICAL  10/15/2016   IR PERCUTANEOUS ART THROMBECTOMY/INFUSION INTRACRANIAL INC DIAG ANGIO 10/15/2016 [name], MD [facility]INTER[facility] RAD  . IR GENERIC HISTORICAL  12/13/2016   IR RADIOLOGIST EVAL & MGMT 12/13/2016 [facility]  . RADIOLOGY WITH ANESTHESIA N/A 10/15/2016   Procedure: RADIOLOGY WITH ANESTHESIA;  Surgeon: [name], MD;  Location: [facility] OR;  Service: Radiology;  Laterality: N/A;  . TOTAL ABDOMINAL HYSTERECTOMY  1990    FAMILY HISTORY: Family History  Problem Relation Age of Onset  . Family history unknown: Yes    SOCIAL HISTORY:  Social History   Social History  . Marital status: Widowed    Spouse name: N/A  . Number of children: 2  . Years of education:  2   Occupational History  .      retired   Social History Main Topics  . Smoking status: Passive Smoke Exposure - Never Smoker  . Smokeless tobacco: Never Used  . Alcohol use No  . Drug use: No  . Sexual activity: No   Other Topics Concern  . Not on file   Social History Narrative   Lives w/daughter and grandchildren   caffeine coffee, 1/2 cup daily     PHYSICAL EXAM  GENERAL EXAM/CONSTITUTIONAL: Vitals:  Vitals:   12/20/16 1102  BP: 106/66  Pulse: 68  Weight: 134 lb 12.8 oz (61.1 kg)  Height: 4\' 10"  (1.473 m)     Body mass index is 28.17 kg/m.  No exam data present  Patient is in no distress; well developed, nourished and groomed; neck is supple  FLAT AFFECT  CARDIOVASCULAR:  Examination of carotid arteries is normal; no carotid bruits  IRREGULAR RATE AND RHYTHM; no murmurs  Examination of peripheral vascular system by observation and palpation is normal  EYES:  Ophthalmoscopic exam of optic  discs and posterior segments is normal; no papilledema or hemorrhages  MUSCULOSKELETAL:  Gait, strength, tone, movements noted in Neurologic exam below  NEUROLOGIC: MENTAL STATUS:  No flowsheet data found.  awake, alert, oriented to person  recent and remote memory intact  normal attention and concentration  language fluent, comprehension intact, naming intact  fund of knowledge appropriate  NORMAL PER CONVERSATION  CRANIAL NERVE:   2nd - no papilledema on fundoscopic exam  2nd, 3rd, 4th, 6th - pupils equal and reactive to light, visual fields full to confrontation, extraocular muscles intact, no nystagmus  5th - facial sensation symmetric  7th - facial strength --> DECR LEFT NL FOLD   8th - hearing intact  9th - palate elevates symmetrically, uvula midline  11th - shoulder shrug symmetric  12th - tongue protrusion midline  MOTOR:   normal bulk and tone  BUE 4+  RLE 4+; LLE 4  SENSORY:   normal and symmetric to light touch,  temperature, vibration  COORDINATION:   finger-nose-finger, fine finger movements normal  REFLEXES:   deep tendon reflexes TRACE and symmetric  GAIT/STATION:   narrow based gait; USING CANE    DIAGNOSTIC DATA (LABS, IMAGING, TESTING) - I reviewed patient records, labs, notes, testing and imaging myself where available.  Lab Results  Component Value Date   WBC 5.2 10/28/2016   HGB 12.4 10/28/2016   HCT 37.9 10/28/2016   MCV 94.0 10/28/2016   PLT 427 (H) 10/28/2016      Component Value Date/Time   NA 141 10/28/2016 1032   K 3.9 10/28/2016 1032   CL 107 10/28/2016 1032   CO2 24 10/28/2016 1032   GLUCOSE 97 10/28/2016 1032   BUN 16 10/28/2016 1032   CREATININE 0.57 10/28/2016 1032   CALCIUM 9.2 10/28/2016 1032   PROT 6.2 (L) 10/20/2016 0553   ALBUMIN 3.3 (L) 10/20/2016 0553   AST 16 10/20/2016 0553   ALT 10 (L) 10/20/2016 0553   ALKPHOS 35 (L) 10/20/2016 0553   BILITOT 0.4 10/20/2016 0553   GFRNONAA >60 10/28/2016 1032   GFRAA >60 10/28/2016 1032   Lab Results  Component Value Date   CHOL 128 10/16/2016   HDL 29 (L) 10/16/2016   LDLCALC 59 10/16/2016   TRIG 198 (H) 10/16/2016   CHOLHDL 4.4 10/16/2016   Lab Results  Component Value Date   HGBA1C 6.1 (H) 10/16/2016   No results found for: VITAMINB12 Lab Results  Component Value Date   TSH 0.729 05/12/2014     10/16/16 MRI brain / MRA head / MRA neck [I reviewed images myself and agree with interpretation. -VRP]  - Acute infarction affecting the right putamen and caudate. Petechial blood products without frank hematoma. Mild swelling but no shift. Punctate acute infarction in the deep insula. No other more peripheral acute infarction. - Intracranial MR angiography shows wide patency of the vessels with re- cannulization of the right MCA. Good distal vessel flow. - Neck MR angiography nondiagnostic because of extensive metal artifact.  10/18/16 carotid u/s  - Findings suggest 1-39% internal carotid artery  stenosis bilaterally. Vertebral arteries are patent with antegrade flow.  10/17/16 TTE - No cardiac source of embolism was identified, but cannot be ruled out on the basis of this examination      ASSESSMENT AND PLAN  76 y.o. year old female here with right putamen and right caudate ischemic infarction, in the setting of atrial fibrillation without anticoagulation. Now on anticoagulation. Patient continued to have some issues with urinary frequency,  gait difficulty, possible depression. Advised patient and daughter to continue to work on physical therapy exercises, use gentle encouragement, monitor for depression, continue secondary stroke prevention medications. Daughter asking about possibility of acupuncture treatment to help with stroke recovery symptoms. Advised there is no absolute contraindication but there may be some caution with acupuncture needles and concomitant anticoagulation. Decision to pursue acupuncture would be based on recommendation experience of potential acupuncture practitioner and consent by patient.   Dx:  1. Cerebrovascular accident (CVA) due to embolism of right middle cerebral artery (HCC)   2. Chronic atrial fibrillation (HCC)   3. Type 2 diabetes mellitus with peripheral neuropathy (HCC)   4. Urinary frequency   5. Gait disturbance, post-stroke      PLAN: - continue stroke prevention (eliquis, pravastatin, metoprolol) - may consider acupuncture treatment; but caution with concomitant eliquis usage  Return if symptoms worsen or fail to improve, for return to PCP.    Suanne MarkerVIKRAM R. PENUMALLI, MD 12/20/2016, 11:18 AM Certified in Neurology, Neurophysiology and Neuroimaging  [facility] [address], [address] [place], [place] [id number] [phone]

## 2016-12-23 ENCOUNTER — Encounter (HOSPITAL_COMMUNITY): Payer: Self-pay | Admitting: Radiology

## 2016-12-28 ENCOUNTER — Other Ambulatory Visit: Payer: Self-pay | Admitting: *Deleted

## 2016-12-28 NOTE — Telephone Encounter (Signed)
Allen DerryAllison Mallory, Speech Therapist, [facility] left a message asking for verbal orders to recertify for another episode of speech therapy, 1week1, 1everyotherweek2.  I contacted speech therapist and gave verbal orders to proceed.

## 2017-01-02 ENCOUNTER — Telehealth: Payer: Self-pay | Admitting: *Deleted

## 2017-01-02 DIAGNOSIS — R1314 Dysphagia, pharyngoesophageal phase: Secondary | ICD-10-CM

## 2017-01-02 NOTE — Telephone Encounter (Signed)
Order placed for MBS per request of Allen [facility]llison Mallory [facility].

## 2017-01-02 NOTE — Telephone Encounter (Signed)
Revonda Standardllison called to update information on orders that will be sent over.  There will be 2 separate orders coming to be signed, 1wk1 and then the new recert period orders.  She was just giving us [facility]I

## 2017-01-05 ENCOUNTER — Other Ambulatory Visit (HOSPITAL_COMMUNITY): Payer: Self-pay | Admitting: Physical Medicine & Rehabilitation

## 2017-01-05 DIAGNOSIS — R1319 Other dysphagia: Secondary | ICD-10-CM

## 2017-01-09 ENCOUNTER — Encounter: Payer: Self-pay | Admitting: Physical Medicine & Rehabilitation

## 2017-01-09 ENCOUNTER — Encounter: Payer: Medicare Other | Attending: Physical Medicine & Rehabilitation

## 2017-01-09 ENCOUNTER — Ambulatory Visit (HOSPITAL_BASED_OUTPATIENT_CLINIC_OR_DEPARTMENT_OTHER): Payer: Medicare Other | Admitting: Physical Medicine & Rehabilitation

## 2017-01-09 VITALS — BP 104/68 | HR 89

## 2017-01-09 DIAGNOSIS — E1142 Type 2 diabetes mellitus with diabetic polyneuropathy: Secondary | ICD-10-CM | POA: Diagnosis not present

## 2017-01-09 DIAGNOSIS — M1712 Unilateral primary osteoarthritis, left knee: Secondary | ICD-10-CM | POA: Diagnosis not present

## 2017-01-09 DIAGNOSIS — Z7722 Contact with and (suspected) exposure to environmental tobacco smoke (acute) (chronic): Secondary | ICD-10-CM | POA: Diagnosis not present

## 2017-01-09 DIAGNOSIS — K219 Gastro-esophageal reflux disease without esophagitis: Secondary | ICD-10-CM | POA: Insufficient documentation

## 2017-01-09 DIAGNOSIS — I69354 Hemiplegia and hemiparesis following cerebral infarction affecting left non-dominant side: Secondary | ICD-10-CM | POA: Diagnosis not present

## 2017-01-09 DIAGNOSIS — Z7984 Long term (current) use of oral hypoglycemic drugs: Secondary | ICD-10-CM | POA: Insufficient documentation

## 2017-01-09 DIAGNOSIS — M170 Bilateral primary osteoarthritis of knee: Secondary | ICD-10-CM | POA: Diagnosis not present

## 2017-01-09 DIAGNOSIS — G4733 Obstructive sleep apnea (adult) (pediatric): Secondary | ICD-10-CM | POA: Diagnosis not present

## 2017-01-09 DIAGNOSIS — G8929 Other chronic pain: Secondary | ICD-10-CM | POA: Insufficient documentation

## 2017-01-09 DIAGNOSIS — I4891 Unspecified atrial fibrillation: Secondary | ICD-10-CM | POA: Insufficient documentation

## 2017-01-09 DIAGNOSIS — Z90710 Acquired absence of both cervix and uterus: Secondary | ICD-10-CM | POA: Diagnosis not present

## 2017-01-09 DIAGNOSIS — Z9889 Other specified postprocedural states: Secondary | ICD-10-CM | POA: Diagnosis not present

## 2017-01-09 NOTE — Patient Instructions (Signed)
Hylan G-F 20 intra-articular injection ?y l thu?c g? HYLAN G-F 20 ????c du?ng ?? ?i?u tri? vim x??ng kh??p ?? ??u g?i. Thu?c na?y la?m tr?n va? ta?o l??p ??m cho kh??p, la?m gia?m ?au trong ??u g?i. Thu?c ny c th? ???c dng cho nh?ng m?c ?ch khc; hy h?i ng??i cung c?p d?ch v? y t? ho?c d??c s? c?a mnh, n?u qu v? c th?c m?c. (CC) NHN HI?U PH? BI?N: Synvisc, Synvisc-One Ti c?n ph?i bo cho ng??i cung c?p d?ch v? y t? c?a mnh ?i?u g tr??c khi dng thu?c ny? H? c?n bi?t li?u qu v? c b?t k? tnh tr?ng no sau ?y khng: -vim ??u g?i tr?m tr?ng -cc tnh tr?ng b?nh l ho?c m?n c?m c?a da -nhi?m tru?ng da ho??c kh??p -?? ma?u ti?nh ma?ch -ph?n ?ng b?t th??ng ho?c d? ?ng v?i hylan G-F 20, hyaluronan (sodium hyaluronate), ho??c tr??ng -pha?n ??ng b?t th???ng ho??c di? ??ng v??i ca?c d??c ph?m kha?c, th?c ph?m, thu?c nhu?m, ho??c ch?t ba?o qua?n -?ang c thai ho??c ??nh co? thai -?ang cho con bu? Ti nn s? d?ng thu?c ny nh? th? no? Thu?c ny ?? tim vo kh??p g?i. Thu?c ny ???c s? d?ng b?i chuyn vin y t? ? b?nh vi?n ho?c ? phng m?ch. Hy bn v?i bc s? nhi khoa c?a qu v? v? vi?c dng thu?c ny ? tr? em. Thu?c ny khng ???c duy?t ?? dng cho tr? em. Qu li?u: N?u qu v? cho r?ng mnh ? dng qu nhi?u thu?c ny, th hy lin l?c v?i trung tm ki?m sot ch?t ??c ho?c phng c?p c?u ngay l?p t?c. L?U : Thu?c ny ch? dnh ring cho qu v?. Khng chia s? thu?c ny v?i nh?ng ng??i khc. N?u ti l? qun m?t li?u th sao? Gi? ?ng h?n cho cc li?u b? sung nh? ? ???c ch? d?n. ??i v??i Synvisc, quy? vi? se? c?n tim ha?ng tu?n cho 3 li?u thu?c. ?i?u quan tr?ng l khng nn b? l? li?u thu?c no. N?u quy? vi? du?ng thu?c Synvisc-One, thi? chi? c?n 1 mu?i tim. Hy lin l?c v?i bc s? ho?c [place] vin y t? c?a mnh, n?u qu v? khng th? gi? ?ng cu?c h?n khm. Nh?ng g c th? t??ng tc v?i thu?c ny? Khng ???c dng thu?c ny cng v?i b?t k? th? no sau  ?y: -ca?c thu?c kha?c ?? tim va?o kh??p, ch??ng ha?n nh? ca?c thu?c steroid ho??c thu?c t -m?t s? thu?c sa?t tru?ng da, ch??ng ha?n nh? benzalkonium chloride [facility] sch ny c th? khng m t? ?? h?t cc t??ng tc c th? x?y ra. Hy ??a cho ng??i cung c?p d?ch v? y t? c?a mnh danh sch t?t c? cc thu?c, th?o d??c, cc thu?c khng c?n toa, ho?c cc ch? ph?m b? sung m qu v? dng. C?ng nn bo cho h? bi?t r?ng qu v? c ht thu?c, u?ng r??u, ho?c c s? d?ng ma ty tri php hay khng. Vi th? c th? t??ng tc v?i thu?c c?a qu v?. Ti c?n ph?i theo di ?i?u g trong khi dng thu?c ny? Hy bo cho bc s? ho?c chuyn vin y t?, n?u cc tri?u ch?ng c?a qu v? khng kh h?n, ho?c tr? nn n?ng h?n. Qu v? s? ???c theo di ch?t ch? trong khi dng thu?c ny. H?u h?t mo?i ng???i ??t ???c s? gia?m ?au cho t?i 6 tha?ng sau khi ?i?u tri?. Tra?nh nh??ng hoa?t ??ng c?n ph?i rn s?c (ca?c mn th? thao ta?c ??ng ma?nh, cha?y b?) ho??c ca?c  hoa?t ??ng mang va?c v?t n??ng trong vo?ng 48 gi?? sau khi tim. Ti c th? nh?n th?y nh?ng tc d?ng ph? no khi dng thu?c ny? Nh?ng tc d?ng ph? qu v? c?n ph?i bo cho bc s? ho?c chuyn vin y t? cng s?m cng t?t: -cc ph?n ?ng d? ?ng, ch?ng h?n nh? da b? m?n ??, ng?a, n?i my ?ay, s?ng ? m?t, mi, ho?c l??i -kh th? -s?t ho?c ?n l?nh -s?ng ho??c ?au d? d?i ?? kh??p -b? b?m tm ho?c xu?t huy?t b?t th??ng Cc tc d?ng ph? khng c?n ph?i ch?m [place] y t? (hy bo cho bc s? ho?c chuyn vin y t?, n?u cc tc d?ng ph? ny ti?p di?n ho?c gy phi?n toi): -chng m?t -?? b?ng da -c?m th?y m?t m?i ho?c cc tri?u ch?ng gi?ng cm -?au ??u -s?ng ho??c ?au nhe? ?? kh??p -?au c? b?p ho?c v?p b? -?au, ??, kch ?ng ho??c b?m ti?m ? ch? tim Danh sch ny c th? khng m t? ?? h?t cc tc d?ng ph? c th? x?y ra. Xin g?i t?i bc s? c?a mnh ?? ???c c? v?n chuyn mn v? cc tc d?ng ph?Ladell Heads. Qu v? c th? t??ng trnh cc tc d?ng ph? cho FDA theo s? [phone]. Ti  nn c?t gi? thu?c c?a mnh ? ?u? Thu?c ny ???c s? d?ng b?i chuyn vin y t? ? b?nh vi?n ho?c ? phng m?ch. Qu v? s? khng ???c c?p thu?c ny ?? c?t gi? t?i nh. L?U : ?y l b?n tm t?t. N c th? khng bao hm t?t c? thng tin c th? c. N?u qu v? th?c m?c v? thu?c ny, xin trao ??i v?i bc s?, d??c s?, ho?c ng??i cung c?p d?ch v? y t? c?a mnh.  2018 Elsevier/Gold Standard (2016-11-03 00:00:00)

## 2017-01-09 NOTE — Progress Notes (Signed)
Indication end-stage osteoarthritis of the knee with pain that limits mobility and does not respond to oral medications.    Medial aspect of the knee was imaged, identified joint space, identified patella, femur, tibia. 30-gauge .5 inch needle was inserted 1 mL of 1% lidocaine were infiltrated into the skin and subcutaneous tissue. Then a 21-gauge, 2 inch needle was inserted Into the joint . Negative drawback for blood. 6 mL of Synvisc-1 were injected. Patient tolerated procedure well Post procedure instructions given

## 2017-01-11 ENCOUNTER — Ambulatory Visit (HOSPITAL_COMMUNITY): Payer: Medicare Other

## 2017-01-11 ENCOUNTER — Other Ambulatory Visit (HOSPITAL_COMMUNITY): Payer: Medicare Other

## 2017-01-13 ENCOUNTER — Ambulatory Visit (HOSPITAL_COMMUNITY)
Admission: RE | Admit: 2017-01-13 | Discharge: 2017-01-13 | Disposition: A | Discharge status: Home / Self Care | Payer: Medicare Other | Source: Ambulatory Visit | Attending: Physical Medicine & Rehabilitation | Admitting: Physical Medicine & Rehabilitation

## 2017-01-13 DIAGNOSIS — R1319 Other dysphagia: Secondary | ICD-10-CM

## 2017-01-13 DIAGNOSIS — R1314 Dysphagia, pharyngoesophageal phase: Secondary | ICD-10-CM | POA: Diagnosis not present

## 2017-01-13 DIAGNOSIS — R1310 Dysphagia, unspecified: Secondary | ICD-10-CM | POA: Diagnosis present

## 2017-01-13 NOTE — Progress Notes (Signed)
Modified Barium Swallow Progress Note  Patient Details  Name: [name] MRN: [id number] Date of Birth: 02/16/1941  Today's Date: 01/13/2017  Modified Barium Swallow completed.  Full report located under Chart Review in the Imaging Section.  Brief recommendations include the following:  Clinical Impression  Pt's oropharyngeal swallow is within gross functional limits given age. She does have mild difficulty orally transferring a barium tablet when given thin liquids, but with a pureed bolus she clears it with ease. Of note, pt did have intermittent coughing and throat clearing throughout the study, but there was no aspiration or penetration observed. Given pt's h/o GERD, question if her symptoms could be related to a more esophageal cause. Would continue with regular textures and thin liquids as tolerated.   Swallow Evaluation Recommendations   Recommended Consults: Consider esophageal assessment   SLP Diet Recommendations: Regular solids;Thin liquid   Liquid Administration via: Cup;Straw   Medication Administration: Whole meds with puree   Supervision: Patient able to self feed   Compensations: Slow rate;Small sips/bites;Follow solids with liquid   Postural Changes: Remain semi-upright after after feeds/meals (Comment);Seated upright at 90 degrees   Oral Care Recommendations: Oral care BID        [name] 01/13/2017,3:00 PM   [name], M.A. CCC-SLP [phone]

## 2017-01-19 ENCOUNTER — Telehealth: Payer: Self-pay | Admitting: *Deleted

## 2017-01-19 DIAGNOSIS — I69354 Hemiplegia and hemiparesis following cerebral infarction affecting left non-dominant side: Secondary | ICD-10-CM

## 2017-01-19 DIAGNOSIS — M1712 Unilateral primary osteoarthritis, left knee: Secondary | ICD-10-CM

## 2017-01-19 NOTE — Telephone Encounter (Signed)
Need orders placed for PT OT @ outpt neuro rehab for week beginning 01/30/17. Done.

## 2017-01-23 ENCOUNTER — Other Ambulatory Visit: Payer: Self-pay

## 2017-01-23 NOTE — Patient Outreach (Signed)
Telephone outreach to patient to obtain mRS was successfully completed. mRS = 3 

## 2017-01-23 NOTE — Patient Outreach (Signed)
Telephone outreach to patient to obtain mRS was sucessfully completed. mRS =3

## 2017-01-23 NOTE — Telephone Encounter (Signed)
This encounter was created in error - please disregard.

## 2017-01-23 NOTE — Addendum Note (Signed)
Addended by: Marlow Baars on: 01/23/2017 04:18 PM   Modules accepted: Kipp Brood

## 2017-01-23 NOTE — Addendum Note (Signed)
Addended by: Marlow Baars on: 01/23/2017 04:38 PM   Modules accepted: Level of Service, SmartSet

## 2017-01-30 ENCOUNTER — Ambulatory Visit: Payer: Medicare Other | Admitting: Occupational Therapy

## 2017-01-30 ENCOUNTER — Encounter: Payer: Self-pay | Admitting: Occupational Therapy

## 2017-02-08 ENCOUNTER — Ambulatory Visit: Payer: Medicare Other | Attending: Physical Medicine & Rehabilitation | Admitting: Physical Therapy

## 2017-02-08 ENCOUNTER — Encounter: Payer: Self-pay | Admitting: Physical Therapy

## 2017-02-08 ENCOUNTER — Ambulatory Visit: Payer: Medicare Other | Admitting: Occupational Therapy

## 2017-02-08 ENCOUNTER — Telehealth: Payer: Self-pay | Admitting: Physical Therapy

## 2017-02-08 VITALS — BP 126/92 | HR 82

## 2017-02-08 DIAGNOSIS — R296 Repeated falls: Secondary | ICD-10-CM | POA: Insufficient documentation

## 2017-02-08 DIAGNOSIS — I69315 Cognitive social or emotional deficit following cerebral infarction: Secondary | ICD-10-CM

## 2017-02-08 DIAGNOSIS — M6281 Muscle weakness (generalized): Secondary | ICD-10-CM

## 2017-02-08 DIAGNOSIS — R2681 Unsteadiness on feet: Secondary | ICD-10-CM

## 2017-02-08 DIAGNOSIS — R2689 Other abnormalities of gait and mobility: Secondary | ICD-10-CM | POA: Diagnosis present

## 2017-02-08 DIAGNOSIS — R42 Dizziness and giddiness: Secondary | ICD-10-CM | POA: Insufficient documentation

## 2017-02-08 DIAGNOSIS — I69354 Hemiplegia and hemiparesis following cerebral infarction affecting left non-dominant side: Secondary | ICD-10-CM | POA: Diagnosis not present

## 2017-02-08 NOTE — Therapy (Signed)
[facility] [address] [place], [place], [address] Phone: [phone]   Fax:  [phone]  Physical Therapy Evaluation  Patient Details  Name: [name] MRN: [id number] Date of Birth: 04/07/1941 Referring Provider: Claudette Laws, MD  Encounter Date: 02/08/2017      PT End of Session - 02/08/17 1533    Visit Number 1   Number of Visits 17   Date for PT Re-Evaluation 04/09/17   Authorization Type Medicare & G-codes with proress note every 10th visit    PT Start Time 1315   PT Stop Time 1400   PT Time Calculation (min) 45 min   Equipment Utilized During Treatment Gait belt   Activity Tolerance Patient tolerated treatment well   Behavior During Therapy [facility] for tasks assessed/performed      Past Medical History:  Diagnosis Date  . Atrial fibrillation (HCC)   . Chronic back pain    "mid back down into lower back" (08/25/2014)  . Frequent falls   . GERD (gastroesophageal reflux disease)   . Hypertriglyceridemia   . OSA on CPAP   . Osteoarthritis    "knees, hands, back" (08/25/2014)  . Pneumonia ~ 2000 X 1  . Subdural hematoma (HCC) july 2015   S/P fall while on Coumadin  . T12 compression fracture (HCC) 2012  . Type II diabetes mellitus (HCC)     Past Surgical History:  Procedure Laterality Date  . APPENDECTOMY  2012  . CATARACT EXTRACTION W/ INTRAOCULAR LENS  IMPLANT, BILATERAL Bilateral 2000's  . IR GENERIC HISTORICAL  10/15/2016   IR PERCUTANEOUS ART THROMBECTOMY/INFUSION INTRACRANIAL INC DIAG ANGIO 10/15/2016 [name], MD [facility]-INTER[facility] RAD  . IR GENERIC HISTORICAL  12/13/2016   IR RADIOLOGIST EVAL & MGMT 12/13/2016 [facility]  . RADIOLOGY WITH ANESTHESIA N/A 10/15/2016   Procedure: RADIOLOGY WITH ANESTHESIA;  Surgeon: [name], MD;  Location: [facility] OR;  Service: Radiology;  Laterality: N/A;  . TOTAL ABDOMINAL HYSTERECTOMY  1990    Vitals:   02/08/17 1325  BP: (!) 126/92  Pulse: 82          Subjective Assessment - 02/08/17 1325    Subjective Pt presents to OPPT s/p R CVA with fall with L hemiparesis.  SInce the CVA pt feels more tired, more forgetful, depressed, "lazy", weak and worsening dizziness.  Would like to cook but it is difficult to turn/changes direction due to LOB and difficulty with carrying items.  At home pt is using RW with tray; prior to CVA pt would use cane intermittently due to knee pain.     Patient is accompained by: Family member;Interpreter   Pertinent History falls with subdural hematoma, Afib, chronic back pain, T12 compression fx, type 2 DM, OA, PNA   Limitations Walking;House hold activities;Other (comment);Standing   Patient Stated Goals To treat her weak legs and walk free of AD   Currently in Pain? Yes   Pain Score 7    Pain Location Knee   Pain Orientation Left   Pain Descriptors / Indicators Aching   Pain Type Chronic pain   Pain Onset More than a month ago   Pain Frequency Constant   Pain Relieving Factors rest, pain medication            [facility] PT Assessment - 02/08/17 1315      Assessment   Medical Diagnosis R CVA, L hemiplegia, gait abnormality   Referring Provider Claudette Laws, MD   Onset Date/Surgical Date 10/05/16   Next MD  Visit Tomorrow 02/09/17   Prior Therapy CIR and HH therapies     Precautions   Precautions Fall   Precaution Comments falls with subdural hematoma, Afib, chronic back pain, T12 compression fx, type 2 DM, OA, PNA   Required Braces or Orthoses Other Brace/Splint   Other Brace/Splint may benefit from L knee support brace     Balance Screen   Has the patient fallen in the past 6 months Yes   How many times? 1  turning around at night to get RW   Has the patient had a decrease in activity level because of a fear of falling?  Yes   Is the patient reluctant to leave their home because of a fear of falling?  Yes     Home Environment   Living Environment Private residence   Living Arrangements  Children   Available Help at Discharge Family   Type of Home House   Home Access Stairs to enter   Entrance Stairs-Number of Steps 3 from garage   Entrance Stairs-Rails Left   Home Layout Two level;Able to live on main level with bedroom/bathroom   Home Equipment Cane - single point;Walker - 2 wheels     Prior Function   Level of Independence Independent   Vocation Retired   Leisure enjoys cooking, going to [employer], Catering manager Impaired   Memory Impairment Decreased short term memory     Observation/Other Assessments   Focus on Therapeutic Outcomes (FOTO)  Did not complete     Sensation   Light Touch Impaired Detail   Light Touch Impaired Details Impaired RLE;Impaired LLE     ROM / Strength   AROM / PROM / Strength Strength     Strength   Overall Strength Deficits   Overall Strength Comments hip flexion 3+ on LLE, 4-/5 RLE; knee extension: 4+/5 LLE, 5/5 RLE, ankle DF: LLE 3-/5, RLE 4-/5; flexion: LLE 4/5, 5/5 RLE-pain in L knee     Ambulation/Gait   Ambulation/Gait Yes   Ambulation/Gait Assistance 4: Min guard  supervision with RW   Ambulation Distance (Feet) 50 Feet   Assistive device Straight cane   Gait Pattern Step-to pattern;Decreased arm swing - left;Decreased step length - right;Decreased stance time - left;Decreased stride length;Decreased dorsiflexion - left;Decreased weight shift to left;Shuffle;Trunk flexed   Ambulation Surface Level;Indoor   Gait velocity 1.68ft/s with cane  1.6ft/s with RW      Standardized Balance Assessment   Standardized Balance Assessment Berg Balance Test;10 meter walk test   10 Meter Walk 25.43 seconds with SPC or 1.69ft/sec; with RW 29.84 seconds or 1.09 ft/sec       Berg Balance Test   Sit to Stand Able to stand  independently using hands   Standing Unsupported Able to stand 2 minutes with supervision   Sitting with Back Unsupported but Feet Supported on Floor or Stool Able to sit safely and securely 2  minutes   Stand to Sit Controls descent by using hands   Transfers Able to transfer with verbal cueing and /or supervision   Standing Unsupported with Eyes Closed Able to stand 10 seconds with supervision   Standing Ubsupported with Feet Together Able to place feet together independently and stand for 1 minute with supervision   From Standing, Reach Forward with Outstretched Arm Can reach forward >12 cm safely (5")   From Standing Position, Pick up Object from Floor Unable to pick up shoe, but reaches 2-5 cm (1-2")  from shoe and balances independently   From Standing Position, Turn to Look Behind Over each Shoulder Turn sideways only but maintains balance   Turn 360 Degrees Needs assistance while turning   Standing Unsupported, Alternately Place Feet on Step/Stool Able to complete >2 steps/needs minimal assist   Standing Unsupported, One Foot in Front Needs help to step but can hold 15 seconds   Standing on One Leg Unable to try or needs assist to prevent fall   Total Score 30   Berg comment: 30/56      Patient demonstrates increased fall risk as noted by score of 30/56 on Berg Balance Scale.  (<36= high risk for falls, close to 100%; 37-45 significant >80%; 46-51 moderate >50%; 52-55 lower >25%)                       PT Education - 02/08/17 1832    Education provided Yes   Education Details Clinical findings, PT POC, goals   Person(s) Educated Patient;Child(ren)   Methods Explanation   Comprehension Verbalized understanding          PT Short Term Goals - 02/08/17 1549      PT SHORT TERM GOAL #1   Title (TARGET DATE: 03/10/2017) Pt will participate in stair negotiation assessment and vestibular evaluation with goal to be set if needed.   Time 4   Period Weeks   Status New     PT SHORT TERM GOAL #2   Title Patient decrease falls risk as indicated by a gait velocity of > or = 1.40ft/s with SPC.  (TARGET DATE: 03/10/2017)    Baseline 1.73ft/sec with cane   Time 4    Period Weeks   Status New     PT SHORT TERM GOAL #3   Title Patient will demonstrate ability to ambulate 250 feet over level surfaces with SPC with 25% cues for increased foot clearance/step length and perform 180 deg turns to L and R with supervision overall. (TARGET DATE: 03/10/2017)    Time 4   Period Weeks   Status New     PT SHORT TERM GOAL #4   Title TUG will be performed and long term goal to be set. (TARGET DATE: 03/10/2017)    Time 4   Period Weeks   Status New     PT SHORT TERM GOAL #5   Title Pt will improve balance and decrease falls risk as indicated by improvement in BERG score to >or = 37/56   Baseline 30/56   Time 4   Period Weeks   Status New           PT Long Term Goals - 02/08/17 1547      PT LONG TERM GOAL #1   Title Patient will demonstrate independence with HEP and verbalize options for community fitness program. (TARGET DATE: 04/07/2017)   Time 8   Period Weeks   Status New     PT LONG TERM GOAL #2   Title Patient will improve gait velocity to >2.0 ft/sec with SPC to indicate pt is safe for limited community ambulation. (TARGET DATE: 04/07/2017)    Time 8   Period Weeks   Status New     PT LONG TERM GOAL #3   Title Patient will score > or = 42/56 on the Berg Balance Test to indicate decreased falls risk. (TARGET DATE: 04/07/2017)    Time 8   Period Weeks   Status New     PT  LONG TERM GOAL #4   Title TUG goal TBD (04/07/2017)    Time --   Period --   Status --     PT LONG TERM GOAL #5   Title Patient will ambulate 500 feet outside over uneven terrain including ramps/curbs with SPC and will negotiate 3 stairs with SPC, 1 rail with supervision for community ambulation. (TARGET DATE: 04/07/2017)   Time 8   Period Weeks   Status New     Additional Long Term Goals   Additional Long Term Goals Yes     PT LONG TERM GOAL #6   Title Pt will improve LLE strength to 4/5 overall at hip, knee and ankle   Baseline 3+ hip flexion, 4- knee extension,  4 knee flexion, 3- ankle DF   Time 8   Period Weeks   Status New               Plan - 15-Feb-2017 1536    Clinical Impression Statement Patient is a 76 year old female presenting to PT s/p a R CVA resulting in L hemiparesis on 10/15/2016. The patient has received CIR and HHPT, and now presents to OP PT. The patient lives with her daughter, who served as her interpreter. The patient has a past medical history that is significant for DM type 2, compression fracture of T12, atrial fibrillation, chronic back pain, GERD, osteoarthritis especially in L knee, and a subdural hematoma due to a fall. Prior to the R CVA, the patient and patient's daughter report she was independent and used a cane occasionally due to some imbalance. PT notes decreased strength in LLE, and patient reports sensation deficits in BLEs with sensation loss in LLE > RLE. The patient has a gait velocity of 1.101ft/s with a cane and close supervision from PT. This gait velocity indicates she is at risk for recurrent falls. Additionally, patient scored 30/56 on the [place] test, which indicates she is at an increased risk for falling. Furthermore, the patient reports she has had a fall since her R CVA, and she has a great fear of falling due to feeling "off balance" often, which are all indicators that she is at an increased risk of falling. The patient's case is evolving and is of moderate complexity. The patient will benefit from skilled PT to address balance and gait deficits to make her safer in decreasing her risk of falling.   Rehab Potential Good   Clinical Impairments Affecting Rehab Potential pain of chronic nature   PT Frequency 2x / week   PT Duration 8 weeks   PT Treatment/Interventions ADLs/Self Care Home Management;Canalith Repostioning;Electrical Stimulation;DME Instruction;Gait training;Stair training;Functional mobility training;Therapeutic activities;Therapeutic exercise;Balance training;Neuromuscular  re-education;Cognitive remediation;Patient/family education;Orthotic Fit/Training;Energy conservation;Taping;Vestibular   PT Next Visit Plan Assess TUG, stair negotiation, vestibular evaluation to assess dizziness (set LTG), begin to set HEP   Recommended Other Services knee support brace for L knee OA?   Consulted and Agree with Plan of Care Patient;Family member/caregiver      Patient will benefit from skilled therapeutic intervention in order to improve the following deficits and impairments:  Abnormal gait, Decreased endurance, Decreased balance, Decreased cognition, Decreased mobility, Decreased strength, Difficulty walking, Dizziness, Impaired sensation, Pain  Visit Diagnosis: Hemiplegia and hemiparesis following cerebral infarction affecting left non-dominant side (HCC)  Repeated falls  Muscle weakness (generalized)  Unsteadiness on feet  Other abnormalities of gait and mobility  Dizziness and giddiness      G-Codes - 2017-02-15 1604    Functional Assessment  Tool Used (Outpatient Only) Berg Balance test score 30/56   Functional Limitation Mobility: Walking and moving around   Mobility: Walking and Moving Around Current Status [phone]) At least 40 percent but less than 60 percent impaired, limited or restricted   Mobility: Walking and Moving Around Goal Status [phone]) At least 1 percent but less than 20 percent impaired, limited or restricted       Problem List Patient Active Problem List   Diagnosis Date Noted  . Primary osteoarthritis of left knee 02/09/2017  . SDH (subdural hematoma) (HCC)   . Urinary frequency   . Type 2 diabetes mellitus with peripheral neuropathy (HCC)   . Cough   . Gait disturbance, post-stroke 10/20/2016  . Hemiparesis affecting left side as late effect of stroke (HCC) 10/20/2016  . Essential hypertension 10/19/2016  . Hyperlipidemia LDL goal <70 10/19/2016  . 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  . Left hemiparesis (HCC)   . Atrial  fibrillation (HCC)   . History of fall   . History of traumatic subdural hematoma   . Vascular headache   . Hypokalemia   . Leukocytosis   . Acute blood loss anemia   . Hypoalbuminemia due to protein-calorie malnutrition (HCC)   . Dysphagia, post-stroke   . Acute respiratory failure (HCC)   . Acute embolic stroke (HCC) - R putamen/caudate and R insular infarcts s/p TICI3 revascularization w/ mechanical thrombectomy d/t AF not on [facility] 10/15/2016  . Pressure injury of skin 10/15/2016  . HCAP (healthcare-associated pneumonia) 11/09/2014  . Weakness 08/25/2014  . Acute bronchitis 08/25/2014  . UTI (lower urinary tract infection) 08/25/2014  . Fracture of lumbar spine (HCC) 08/25/2014  . Subdural hematoma, post-traumatic (HCC) 05/12/2014  . Chronic atrial fibrillation (HCC) 05/12/2014  . Diabetes mellitus (HCC) 05/12/2014  . OSA on CPAP 05/12/2014    [place], SPT  02/09/2017   [facility] [address] [place], [place], [address] Phone: [phone]   Fax:  [phone]  Name: Amai Cappiello MRN: [id number] Date of Birth: [birth year]

## 2017-02-08 NOTE — Telephone Encounter (Signed)
Hello Dr. Wynn Banker, I evaluated Mrs. [name] today at outpatient and she is continuing to report significant L knee pain and instability.  I saw where she has an appointment with you on 4/26.  Do you think she would benefit from a hinged knee support brace?  Thanks, Edman Circle, PT, DPT 02/08/17    6:57 PM

## 2017-02-09 ENCOUNTER — Ambulatory Visit (HOSPITAL_BASED_OUTPATIENT_CLINIC_OR_DEPARTMENT_OTHER): Payer: Medicare Other | Admitting: Physical Medicine & Rehabilitation

## 2017-02-09 ENCOUNTER — Other Ambulatory Visit: Payer: Self-pay | Admitting: Physical Medicine & Rehabilitation

## 2017-02-09 ENCOUNTER — Encounter: Payer: Medicare Other | Attending: Physical Medicine & Rehabilitation

## 2017-02-09 ENCOUNTER — Encounter: Payer: Self-pay | Admitting: Physical Medicine & Rehabilitation

## 2017-02-09 VITALS — BP 113/79 | HR 76

## 2017-02-09 DIAGNOSIS — I639 Cerebral infarction, unspecified: Secondary | ICD-10-CM | POA: Diagnosis not present

## 2017-02-09 DIAGNOSIS — M1712 Unilateral primary osteoarthritis, left knee: Secondary | ICD-10-CM

## 2017-02-09 DIAGNOSIS — K219 Gastro-esophageal reflux disease without esophagitis: Secondary | ICD-10-CM | POA: Diagnosis not present

## 2017-02-09 DIAGNOSIS — I69354 Hemiplegia and hemiparesis following cerebral infarction affecting left non-dominant side: Secondary | ICD-10-CM | POA: Diagnosis present

## 2017-02-09 DIAGNOSIS — Z90710 Acquired absence of both cervix and uterus: Secondary | ICD-10-CM | POA: Insufficient documentation

## 2017-02-09 DIAGNOSIS — Z7984 Long term (current) use of oral hypoglycemic drugs: Secondary | ICD-10-CM | POA: Diagnosis not present

## 2017-02-09 DIAGNOSIS — M170 Bilateral primary osteoarthritis of knee: Secondary | ICD-10-CM | POA: Insufficient documentation

## 2017-02-09 DIAGNOSIS — I4891 Unspecified atrial fibrillation: Secondary | ICD-10-CM | POA: Insufficient documentation

## 2017-02-09 DIAGNOSIS — E1142 Type 2 diabetes mellitus with diabetic polyneuropathy: Secondary | ICD-10-CM | POA: Insufficient documentation

## 2017-02-09 DIAGNOSIS — Z9889 Other specified postprocedural states: Secondary | ICD-10-CM | POA: Diagnosis not present

## 2017-02-09 DIAGNOSIS — G8929 Other chronic pain: Secondary | ICD-10-CM | POA: Diagnosis not present

## 2017-02-09 DIAGNOSIS — Z7722 Contact with and (suspected) exposure to environmental tobacco smoke (acute) (chronic): Secondary | ICD-10-CM | POA: Diagnosis not present

## 2017-02-09 DIAGNOSIS — G4733 Obstructive sleep apnea (adult) (pediatric): Secondary | ICD-10-CM | POA: Diagnosis not present

## 2017-02-09 MED ORDER — TIZANIDINE HCL 4 MG PO TABS: 4.0000 mg | ORAL_TABLET | Freq: Every day | ORAL | 1 refills | Status: DC

## 2017-02-09 NOTE — Progress Notes (Signed)
Subjective:    Patient ID: [name], female    DOB: [birth year], 76 y.o.   MRN: [id number] 76 year old right-handed female, history of atrial fibrillation, on no anticoagulation due to history of falls and subdural hematoma.  History was taken from her daughter. Presented October 15, 2016, with left-sided weakness and facial droop. The patient lives with daughter and son-in-law.  Reported to be independent with assistive device prior to admission.  Cranial CT scan showed positive hyperdense right MCA bifurcation suspicious for emergent large vessel occlusion.  No intracranial hemorrhage.  CT, cerebral perfusion scan positive for emergent large vessel occlusion, distal right M1 or right MCA bifurcation.  Cerebral angiogram and underwent complete revascularization of occlusion per Interventional Radiology. MRI of the brain, October 16, 2016, showed acute infarct infecting the right putamen and caudate.  Petechial blood products without frank hematoma.  Mild swelling without shift.  MRA of the head with wide patency of the vessels with recannulization of the right MCA. Echocardiogram with ejection fraction of 70%.  No wall motion abnormalities.  Carotid Dopplers with no ICA stenosis.  Aspirin for CVA prophylaxis initiated.  Subcutaneous Lovenox for DVT prophylaxis.  Await plan for Eliquis 5 mg b.i.d. 14 days after stroke prevention at the recommendations of Neurology Services.   HPI Initially synvisc was helpful but less helpful  Left calf spasms at night , no shaking Received a message from physical therapy asking whether knee orthosis may be beneficial for this patient. Pain Inventory Average Pain 6 Pain Right Now 5 My pain is constant and dull  In the last 24 hours, has pain interfered with the following? General activity 4 Relation with others 4 Enjoyment of life 6 What TIME of day is your pain at its worst? morning Sleep (in general) Poor  Pain is worse with: walking,  bending, sitting, inactivity and standing Pain improves with: medication Relief from Meds: 5  Mobility walk with assistance use a cane use a walker needs help with transfers  Function retired I need assistance with the following:  meal prep, household duties and shopping  Neuro/Psych bladder control problems weakness numbness tingling trouble walking dizziness depression  Prior Studies Any changes since last visit?  no  Physicians involved in your care Any changes since last visit?  no   Family History  Problem Relation Age of Onset  . Family history unknown: Yes   Social History   Social History  . Marital status: Widowed    Spouse name: N/A  . Number of children: 2  . Years of education: 2   Occupational History  .      retired   Social History Main Topics  . Smoking status: Passive Smoke Exposure - Never Smoker  . Smokeless tobacco: Never Used  . Alcohol use No  . Drug use: No  . Sexual activity: No   Other Topics Concern  . Not on file   Social History Narrative   Lives w/daughter and grandchildren   caffeine coffee, 1/2 cup daily   Past Surgical History:  Procedure Laterality Date  . APPENDECTOMY  2012  . CATARACT EXTRACTION W/ INTRAOCULAR LENS  IMPLANT, BILATERAL Bilateral 2000's  . IR GENERIC HISTORICAL  10/15/2016   IR PERCUTANEOUS ART THROMBECTOMY/INFUSION INTRACRANIAL INC DIAG ANGIO 10/15/2016 [name], MD [facility]INTER[facility]  . IR GENERIC HISTORICAL  12/13/2016   IR RADIOLOGIST EVAL & MGMT 12/13/2016 [facility]  . RADIOLOGY WITH ANESTHESIA N/A 10/15/2016   Procedure: RADIOLOGY WITH ANESTHESIA;  Surgeon: [name], MD;  Location: [facility] OR;  Service: Radiology;  Laterality: N/A;  . TOTAL ABDOMINAL HYSTERECTOMY  1990   Past Medical History:  Diagnosis Date  . Atrial fibrillation (HCC)   . Chronic back pain    "mid back down into lower back" (08/25/2014)  . Frequent falls   . GERD (gastroesophageal reflux disease)   .  Hypertriglyceridemia   . OSA on CPAP   . Osteoarthritis    "knees, hands, back" (08/25/2014)  . Pneumonia ~ 2000 X 1  . Subdural hematoma (HCC) july 2015   S/P fall while on Coumadin  . T12 compression fracture (HCC) 2012  . Type II diabetes mellitus (HCC)    There were no vitals taken for this visit.  Opioid Risk Score:   Fall Risk Score:  `1  Depression screen PHQ 2/9  Depression screen PHQ 2/9 11/08/2016  Decreased Interest 3  Down, Depressed, Hopeless 1  PHQ - 2 Score 4  Altered sleeping 3  Tired, decreased energy 3  Change in appetite 0  Feeling bad or failure about yourself  1  Trouble concentrating 1  Moving slowly or fidgety/restless 3  Suicidal thoughts 0  PHQ-9 Score 15  Difficult doing work/chores Somewhat difficult    Review of Systems  Constitutional: Negative.   HENT: Negative.   Eyes: Negative.   Respiratory: Positive for shortness of breath and wheezing.   Cardiovascular: Negative.   Gastrointestinal: Negative.   Endocrine: Negative.   Genitourinary: Negative.   Musculoskeletal: Negative.   Skin: Negative.   Allergic/Immunologic: Negative.   Neurological: Negative.   Hematological: Negative.   Psychiatric/Behavioral: Negative.   All other systems reviewed and are negative.      Objective:   Physical Exam  Constitutional: She is oriented to person, place, and time. She appears well-developed and well-nourished.  HENT:  Head: Normocephalic and atraumatic.  Eyes: Conjunctivae and EOM are normal. Pupils are equal, round, and reactive to light.  Neck: Normal range of motion.  Neurological: She is alert and oriented to person, place, and time. Gait abnormal.  Motor strength is 5/5 in the right deltoid, biceps, triceps, grip 5/5 right hip flexor, knee extension, flexion 4 plus in the left deltoid, biceps, triceps, grip, hip flexor, knee extensor, ankle dorsal flexion. Ambulates with wide base support. No evidence to drag or knee instability. Left  lower extremity. No evidence of knee effusion does have some tenderness along the medial and lateral joint line. No evidence of erythema. No pain with knee range of motion.   Psychiatric: She has a normal mood and affect.  Nursing note and vitals reviewed.  no evidence of clonus at the left ankle        Assessment & Plan:  1. 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 Overall having a good recovery, but is starting to plateau.  2. Left knee osteoarthritis some partial relief with Synvisc injection. Will order knee orthosis.  3. Left lower extremity spasms. Her mainly at night. This may represent some spasticity. Will start tizanidine 4 mg daily at bedtime.  Discussed with patient as well as her daughter.

## 2017-02-09 NOTE — Patient Instructions (Signed)
Please use Voltaren gel 4 times per day  Will try tizanidine 4 mg at night, if excessive sedation the next day, take 1 half tablet, 2 mg  Order placed for left knee orthosis, PT will coordinate

## 2017-02-09 NOTE — Therapy (Signed)
[facility] [address] [place], [place], [address] Phone: [phone]   Fax:  [phone]  Occupational Therapy Evaluation  Patient Details  Name: [name] MRN: [id number] Date of Birth: [birth year] Referring Provider: Dr. Wynn Banker  Encounter Date: 02/08/2017      OT End of Session - 02/09/17 0915    Visit Number 1   Number of Visits 17   Date for OT Re-Evaluation 04/08/17   Authorization Type Medicare/ Medicaid   Authorization Time Period 60 days   Authorization - Visit Number 1   Authorization - Number of Visits 10   OT Start Time 1405   OT Stop Time 1445   OT Time Calculation (min) 40 min   Activity Tolerance Patient tolerated treatment well   Behavior During Therapy [facility] for tasks assessed/performed      Past Medical History:  Diagnosis Date  . Atrial fibrillation (HCC)   . Chronic back pain    "mid back down into lower back" (08/25/2014)  . Frequent falls   . GERD (gastroesophageal reflux disease)   . Hypertriglyceridemia   . OSA on CPAP   . Osteoarthritis    "knees, hands, back" (08/25/2014)  . Pneumonia ~ 2000 X 1  . Subdural hematoma (HCC) july 2015   S/P fall while on Coumadin  . T12 compression fracture (HCC) 2012  . Type II diabetes mellitus (HCC)     Past Surgical History:  Procedure Laterality Date  . APPENDECTOMY  2012  . CATARACT EXTRACTION W/ INTRAOCULAR LENS  IMPLANT, BILATERAL Bilateral 2000's  . IR GENERIC HISTORICAL  10/15/2016   IR PERCUTANEOUS ART THROMBECTOMY/INFUSION INTRACRANIAL INC DIAG ANGIO 10/15/2016 [name], MD [facility]-INTERV RAD  . IR GENERIC HISTORICAL  12/13/2016   IR RADIOLOGIST EVAL & MGMT 12/13/2016 [facility]  . RADIOLOGY WITH ANESTHESIA N/A 10/15/2016   Procedure: RADIOLOGY WITH ANESTHESIA;  Surgeon: [name], MD;  Location: [facility] OR;  Service: Radiology;  Laterality: N/A;  . TOTAL ABDOMINAL HYSTERECTOMY  1990    There were no vitals filed for  this visit.      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 Yes   Pain Score 5    Pain Location Knee   Pain Orientation Left   Pain Descriptors / Indicators Aching   Pain Type Chronic pain   Pain Onset More than a month ago   Pain Frequency Constant   Aggravating Factors  standing   Pain Relieving Factors rest pain meds   Multiple Pain Sites No           [facility] OT Assessment - 02/09/17 0001      Assessment   Diagnosis R MCA CVA   Referring Provider Dr. Wynn Banker   Onset Date 10/15/17   Prior Therapy CIR, HHOT     Precautions   Precautions Fall   Precaution Comments falls with subdural hematoma, Afib, chronic back pain, T12 compression fx, type 2 DM, OA, PNA     Balance Screen   Has the patient fallen in the past 6 months Yes   How many times? 1   Has the patient had a decrease in activity level because of a fear of falling?  Yes   Is the patient reluctant to leave their home because of a  fear of falling?  Yes     Home  Environment   Family/patient expects to be discharged to: Private residence   Type of Home --  stays on first floor   Home Access Stairs   Bathroom Shower/Tub Tub/Shower unit  has tub bench   Lives With Daughter  and son in law     Prior Function   Level of Independence Independent   Vocation Retired   Leisure enjoys, going to temple,gardening     ADL   Eating/Feeding Minimal assistance   Grooming Modified independent   Upper Body Bathing Supervision/safety  distant   Lower Body Bathing Supervision/safety  Distant   Upper Body Dressing Needs assist for fasteners;Increased time  sometimes min A with bra   Lower Body Dressing Increased time   Toilet Tranfer Modified independent  with walker   Tub/Shower Transfer --  distant supervision-someone is at home      IADL   Shopping Needs to be accompanied on any shopping trip   Light Housekeeping Does not participate in any housekeeping tasks   Prior Level of Function Meal Prep prepared meals, washed dishes   Meal Prep --  Has only attempted simple cooking task with min A   Medication Management Takes responsibility if medication is prepared in advance in seperate dosage  daughter assists, often forgets meds   Financial Management Requires assistance     Mobility   Mobility Status --  modified independent with walker     Written Expression   Handwriting 100% legible  for name     Vision Assessment   Vision Assessment Vision not tested  Pt reports vision is blurry, worse with head movements     Activity Tolerance   Activity Tolerance --  15-20 mins in standing prior to fatigue     Cognition   Overall Cognitive Status Cognition to be further assessed in functional context PRN   Memory Impaired   Memory Impairment Decreased short term memory     Sensation   Light Touch Appears Intact   Hot/Cold Not tested     ROM / Strength   AROM / PROM / Strength AROM     AROM   Overall AROM  Deficits   Overall AROM Comments RUE shoulder flexion 120, abduction 90, LUE shoulder flexion 110, abduct 80                           OT Short Term Goals - 02/08/17 1439      OT SHORT TERM GOAL #1   Title Pt/ caregiver will be I with HEP. due 03/09/17   Time 4   Period Weeks   Status New     OT SHORT TERM GOAL #2   Title Pt will perform simple cooking/ home management in standing x 25 mins without rest break or LOB.   Baseline 10-15 mins   Time 4   Period Weeks   Status New     OT SHORT TERM GOAL #3   Title Pt/ caregiver will verbalize understanding of compensatory stategies for short term memory deficits.   Time 4   Period Weeks   Status New     OT SHORT TERM GOAL #4   Title Test standing functional reach and set goal prn   Time 4   Period Weeks   Status New     OT  SHORT TERM GOAL #5   Title Further assess coordination and set goal PRN  Time 4   Period Weeks   Status New           OT Long Term Goals - 02/08/17 1440      OT LONG TERM GOAL #1   Title Pt will perfom mod complex cooking/ home management in standing x 45 mins without rest break or LOB. due 04/08/17   Time 8   Period Weeks   Status New     OT LONG TERM GOAL #2   Title Pt will increase bilateral grip strength to 25 lbs or greater for increased functional use for ADLs.   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 Pt's PMH is significant for A-fib, PNA, T-12 compression fx and subdural hematoma s/p fall. Pt can benefit from skilled occuaptational therapy to maximize pt's safety and independence with ADLs/ IADLs and to maintain quality of life. Pt is living with her daughter and son in law. Pt speaks [place] and her daughter is able to interpret for her.   Rehab Potential Good   OT Frequency 2x / week  plus eval   OT Duration 8 weeks   OT Treatment/Interventions Self-care/ADL training;Moist Heat;Fluidtherapy;DME and/or AE instruction;Splinting;Patient/family education;Balance training;Therapeutic exercises;Ultrasound;Therapeutic exercise;Therapeutic activities;Cognitive  remediation/compensation;Passive range of motion;Functional Mobility Training;Neuromuscular education;Cryotherapy;Electrical Stimulation;Parrafin;Energy conservation;Manual Therapy   Plan assess standing functional reach, and 9 hole peg test, inital HEP   Consulted and Agree with Plan of Care Patient;Family member/caregiver   Family Member Consulted daughter      Patient will benefit from skilled therapeutic intervention in order to improve the following deficits and impairments:  Abnormal gait, Decreased cognition, Decreased knowledge of use of DME, Pain, Impaired sensation, Decreased mobility, Decreased coordination, Decreased activity tolerance, Decreased endurance, Decreased range of motion, Decreased strength, Impaired UE functional use, Impaired perceived functional ability, Difficulty walking, Decreased safety awareness, Decreased knowledge of precautions, Decreased balance  Visit Diagnosis: Muscle weakness (generalized) - Plan: Ot plan of care cert/re-cert  Cognitive social or emotional deficit following cerebral infarction - Plan: Ot plan of care cert/re-cert  Unsteadiness on feet - Plan: Ot plan of care cert/re-cert  Other abnormalities of gait and mobility - Plan: Ot plan of care cert/re-cert      G-Codes - 02/25/17 1306    Functional Assessment Tool Used (Outpatient only) grip strength RUE 20 lbs, LUE 20 lbs, shoulder flexion RUE 120*, LUE 110   Functional Limitation Self care   Self Care Current Status (Z6109) At least 20 percent but less than 40 percent impaired, limited or restricted   Self Care Goal Status (U0454) At least 1 percent but less than 20 percent impaired, limited or restricted  Problem List Patient Active Problem List   Diagnosis Date Noted  . SDH (subdural hematoma) (HCC)   . Urinary frequency   . Type 2 diabetes mellitus with peripheral neuropathy (HCC)   . Cough   . Gait disturbance, post-stroke 10/20/2016  . Hemiparesis affecting left side as  late effect of stroke (HCC) 10/20/2016  . Essential hypertension 10/19/2016  . Hyperlipidemia LDL goal <70 10/19/2016  . 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  . Left hemiparesis (HCC)   . Atrial fibrillation (HCC)   . History of fall   . History of traumatic subdural hematoma   . Vascular headache   . Hypokalemia   . Leukocytosis   . Acute blood loss anemia   . Hypoalbuminemia due to protein-calorie malnutrition (HCC)   . Dysphagia, post-stroke   . Acute respiratory failure (HCC)   . Acute embolic stroke (HCC) - R putamen/caudate and R insular infarcts s/p TICI3 revascularization w/ mechanical thrombectomy d/t AF not on [facility] 10/15/2016  . Pressure injury of skin 10/15/2016  . HCAP (healthcare-associated pneumonia) 11/09/2014  . Weakness 08/25/2014  . Acute bronchitis 08/25/2014  . UTI (lower urinary tract infection) 08/25/2014  . Fracture of lumbar spine (HCC) 08/25/2014  . Subdural hematoma, post-traumatic (HCC) 05/12/2014  . Chronic atrial fibrillation (HCC) 05/12/2014  . Diabetes mellitus (HCC) 05/12/2014  . OSA on CPAP 05/12/2014    Dacia Capers 02/09/2017, 1:13 PM  [facility] [address] [place], [place], [address] Phone: [phone]   Fax:  [phone]  Name: [name] MRN: [id number] Date of Birth: 1941-05-18

## 2017-02-11 ENCOUNTER — Ambulatory Visit (INDEPENDENT_AMBULATORY_CARE_PROVIDER_SITE_OTHER): Payer: Medicare Other

## 2017-02-11 ENCOUNTER — Encounter (HOSPITAL_COMMUNITY): Payer: Self-pay | Admitting: Emergency Medicine

## 2017-02-11 ENCOUNTER — Ambulatory Visit (HOSPITAL_COMMUNITY)
Admission: EM | Admit: 2017-02-11 | Discharge: 2017-02-11 | Disposition: A | Discharge status: Home / Self Care | Payer: Medicare Other | Attending: Internal Medicine | Admitting: Internal Medicine

## 2017-02-11 DIAGNOSIS — Z7984 Long term (current) use of oral hypoglycemic drugs: Secondary | ICD-10-CM | POA: Diagnosis not present

## 2017-02-11 DIAGNOSIS — E781 Pure hyperglyceridemia: Secondary | ICD-10-CM | POA: Insufficient documentation

## 2017-02-11 DIAGNOSIS — Z79899 Other long term (current) drug therapy: Secondary | ICD-10-CM | POA: Insufficient documentation

## 2017-02-11 DIAGNOSIS — I4891 Unspecified atrial fibrillation: Secondary | ICD-10-CM | POA: Insufficient documentation

## 2017-02-11 DIAGNOSIS — J690 Pneumonitis due to inhalation of food and vomit: Secondary | ICD-10-CM | POA: Diagnosis not present

## 2017-02-11 DIAGNOSIS — Z7901 Long term (current) use of anticoagulants: Secondary | ICD-10-CM | POA: Diagnosis not present

## 2017-02-11 DIAGNOSIS — K219 Gastro-esophageal reflux disease without esophagitis: Secondary | ICD-10-CM | POA: Diagnosis not present

## 2017-02-11 DIAGNOSIS — R350 Frequency of micturition: Secondary | ICD-10-CM

## 2017-02-11 DIAGNOSIS — R0602 Shortness of breath: Secondary | ICD-10-CM | POA: Diagnosis not present

## 2017-02-11 DIAGNOSIS — Z7983 Long term (current) use of bisphosphonates: Secondary | ICD-10-CM | POA: Insufficient documentation

## 2017-02-11 DIAGNOSIS — E119 Type 2 diabetes mellitus without complications: Secondary | ICD-10-CM | POA: Insufficient documentation

## 2017-02-11 DIAGNOSIS — R05 Cough: Secondary | ICD-10-CM

## 2017-02-11 DIAGNOSIS — M1990 Unspecified osteoarthritis, unspecified site: Secondary | ICD-10-CM | POA: Diagnosis not present

## 2017-02-11 DIAGNOSIS — G4733 Obstructive sleep apnea (adult) (pediatric): Secondary | ICD-10-CM | POA: Insufficient documentation

## 2017-02-11 DIAGNOSIS — I482 Chronic atrial fibrillation, unspecified: Secondary | ICD-10-CM

## 2017-02-11 LAB — BASIC METABOLIC PANEL
ANION GAP: 11 (ref 5–15)
BUN: 11 mg/dL (ref 6–20)
CHLORIDE: 103 mmol/L (ref 101–111)
CO2: 25 mmol/L (ref 22–32)
Calcium: 10.1 mg/dL (ref 8.9–10.3)
Creatinine, Ser: 0.56 mg/dL (ref 0.44–1.00)
GFR calc Af Amer: >60 mL/min (ref >60)
Glucose, Bld: 144 mg/dL — ABNORMAL HIGH (ref 65–99)
POTASSIUM: 4.3 mmol/L (ref 3.5–5.1)
Sodium: 139 mmol/L (ref 135–145)

## 2017-02-11 LAB — POCT URINALYSIS DIP (DEVICE)
BILIRUBIN URINE: NEGATIVE
Glucose, UA: NEGATIVE mg/dL
KETONES UR: NEGATIVE mg/dL
NITRITE: NEGATIVE
Protein, ur: NEGATIVE mg/dL
SPECIFIC GRAVITY, URINE: 1.01 (ref 1.005–1.030)
UROBILINOGEN UA: 0.2 mg/dL (ref 0.0–1.0)
pH: 7 (ref 5.0–8.0)

## 2017-02-11 LAB — CBC WITH DIFFERENTIAL/PLATELET
BASOS ABS: 0 10*3/uL (ref 0.0–0.1)
Basophils Relative: 0 %
EOS PCT: 0 %
Eosinophils Absolute: 0 10*3/uL (ref 0.0–0.7)
HEMATOCRIT: 41.1 % (ref 36.0–46.0)
HEMOGLOBIN: 13.6 g/dL (ref 12.0–15.0)
LYMPHS ABS: 0.7 10*3/uL (ref 0.7–4.0)
LYMPHS PCT: 8 %
MCH: 30.6 pg (ref 26.0–34.0)
MCHC: 33.1 g/dL (ref 30.0–36.0)
MCV: 92.6 fL (ref 78.0–100.0)
Monocytes Absolute: 0.3 10*3/uL (ref 0.1–1.0)
Monocytes Relative: 3 %
NEUTROS ABS: 8.2 10*3/uL — AB (ref 1.7–7.7)
Neutrophils Relative %: 89 %
Platelets: 319 10*3/uL (ref 150–400)
RBC: 4.44 MIL/uL (ref 3.87–5.11)
RDW: 12.1 % (ref 11.5–15.5)
WBC: 9.2 10*3/uL (ref 4.0–10.5)

## 2017-02-11 MED ORDER — LEVOFLOXACIN 750 MG PO TABS: 750.0000 mg | ORAL_TABLET | Freq: Every day | ORAL | 0 refills | Status: AC

## 2017-02-11 MED ORDER — LEVOFLOXACIN 750 MG PO TABS: 750.0000 mg | ORAL_TABLET | Freq: Every day | ORAL | 0 refills | Status: DC

## 2017-02-11 NOTE — ED Provider Notes (Signed)
CSN: [id number]     Arrival date & time 02/11/17  1412 History   First MD Initiated Contact with Patient 02/11/17 1520     Chief Complaint  Patient presents with  . URI   (Consider location/radiation/quality/duration/timing/severity/associated sxs/prior Treatment) 76 year old female presents to clinic in care of her daughter with worsening shortness of breath over the previous 3 days. Patient has past history of stroke, daughter states that she has trouble swallowing, and that 3 days ago she choked on some salid. Daughter and some leftover amoxicillin, and prednisone, and gave them to her mother yesterday and today. Has also been giving her neb treatments with some relief.   The history is provided by a relative.  URI  Presenting symptoms: cough   Presenting symptoms: no congestion, no fever and no rhinorrhea   Severity:  Moderate Onset quality:  Gradual Duration:  3 days Timing:  Constant Progression:  Worsening Chronicity:  New Relieved by:  Nothing Worsened by:  Nothing Ineffective treatments:  Prescription medications Associated symptoms: no wheezing   Risk factors: being elderly   Risk factors: no recent illness and no recent travel     Past Medical History:  Diagnosis Date  . Atrial fibrillation (HCC)   . Chronic back pain    "mid back down into lower back" (08/25/2014)  . Frequent falls   . GERD (gastroesophageal reflux disease)   . Hypertriglyceridemia   . OSA on CPAP   . Osteoarthritis    "knees, hands, back" (08/25/2014)  . Pneumonia ~ 2000 X 1  . Subdural hematoma (HCC) july 2015   S/P fall while on Coumadin  . T12 compression fracture (HCC) 2012  . Type II diabetes mellitus (HCC)    Past Surgical History:  Procedure Laterality Date  . APPENDECTOMY  2012  . CATARACT EXTRACTION W/ INTRAOCULAR LENS  IMPLANT, BILATERAL Bilateral 2000's  . IR GENERIC HISTORICAL  10/15/2016   IR PERCUTANEOUS ART THROMBECTOMY/INFUSION INTRACRANIAL INC DIAG ANGIO 10/15/2016  [name], MD [facility]INTER[facility]  . IR GENERIC HISTORICAL  12/13/2016   IR RADIOLOGIST EVAL & MGMT 12/13/2016 [facility]  . RADIOLOGY WITH ANESTHESIA N/A 10/15/2016   Procedure: RADIOLOGY WITH ANESTHESIA;  Surgeon: [name], MD;  Location: [facility] OR;  Service: Radiology;  Laterality: N/A;  . TOTAL ABDOMINAL HYSTERECTOMY  1990   Family History  Problem Relation Age of Onset  . Family history unknown: Yes   Social History  Substance Use Topics  . Smoking status: Passive Smoke Exposure - Never Smoker  . Smokeless tobacco: Never Used  . Alcohol use No   OB History    No data available     Review of Systems  Constitutional: Negative for chills and fever.  HENT: Negative for congestion and rhinorrhea.   Respiratory: Positive for cough and shortness of breath. Negative for wheezing.   Cardiovascular: Negative for chest pain and palpitations.  Gastrointestinal: Negative for diarrhea, nausea and vomiting.  Genitourinary: Positive for frequency. Negative for difficulty urinating and dysuria.  Musculoskeletal: Negative.   Skin: Negative.   Neurological: Negative.     Allergies  Lactose intolerance (gi); Banana; Peanut-containing drug products; and Chocolate  Home Medications   Prior to Admission medications   Medication Sig Start Date End Date Taking? Authorizing Provider  acetaminophen (TYLENOL) 500 MG tablet Take 1,000 mg by mouth every 12 (twelve) hours as needed.   Yes Historical Provider, MD  albuterol (ACCUNEB) 0.63 MG/3ML nebulizer solution Take 0.63 mg by nebulization every 6 (six) hours as needed. When  unable to use inaler 07/12/16  Yes Historical Provider, MD  alendronate (FOSAMAX) 70 MG tablet Take 70 mg by mouth every Monday.  04/30/14  Yes Historical Provider, MD  chlorpheniramine-HYDROcodone (TUSSIONEX PENNKINETIC ER) 10-8 MG/5ML SUER Take 5 mLs by mouth every 12 (twelve) hours as needed for cough.   Yes Historical Provider, MD  ELIQUIS 5 MG TABS tablet  11/03/16   Yes Historical Provider, MD  levalbuterol (XOPENEX HFA) 45 MCG/ACT inhaler Inhale 1 puff into the lungs every 4 (four) hours as needed. 04/22/15  Yes Historical Provider, MD  metFORMIN (GLUCOPHAGE) 500 MG tablet Take 500 mg by mouth 2 (two) times daily. 11/22/16  Yes Historical Provider, MD  metoprolol tartrate (LOPRESSOR) 25 MG tablet Take 0.5 tablets (12.5 mg total) by mouth 2 (two) times daily. 11/01/16  Yes Daniel J Angiulli, PA-C  pravastatin (PRAVACHOL) 10 MG tablet Take 1 tablet (10 mg total) by mouth daily. 11/01/16  Yes Daniel J Angiulli, PA-C  tiZANidine (ZANAFLEX) 4 MG tablet TAKE 1 TABLET(4 MG) BY MOUTH AT BEDTIME 02/09/17  Yes Erick Colace, MD  butalbital-acetaminophen-caffeine (FIORICET, ESGIC) [phone] MG tablet Take 1 tablet by mouth every 12 (twelve) hours as needed for headache. 11/01/16   Mcarthur Rossetti Angiulli, PA-C  diclofenac sodium (VOLTAREN) 1 % GEL Apply 2 g topically 4 (four) times daily. 11/08/16   Erick Colace, MD  fluticasone (FLONASE) 50 MCG/ACT nasal spray Place 1 spray into both nostrils daily as needed for allergies.  02/18/14   Historical Provider, MD  HYDROcodone-acetaminophen (NORCO/VICODIN) 5-325 MG tablet Take 1 tablet by mouth every 12 (twelve) hours as needed for moderate pain.    Historical Provider, MD  levofloxacin (LEVAQUIN) 750 MG tablet Take 1 tablet (750 mg total) by mouth daily. 02/11/17 02/16/17  Dorena Bodo, NP  traMADol (ULTRAM) 50 MG tablet Take 50 mg by mouth as needed. 11/11/16   Historical Provider, MD   Meds Ordered and Administered this Visit  Medications - No data to display  BP 110/68 (BP Location: Left Arm)   Pulse (!) 104   Temp 98.2 F (36.8 C) (Oral)   Resp 20   SpO2 95%  No data found.   Physical Exam  Constitutional: She is oriented to person, place, and time. She appears well-developed and well-nourished. No distress.  HENT:  Head: Normocephalic and atraumatic.  Right Ear: External ear normal.  Left Ear: External ear  normal.  Eyes: Conjunctivae are normal. Right eye exhibits no discharge. Left eye exhibits no discharge.  Cardiovascular: Normal rate and regular rhythm.   Pulmonary/Chest: Effort normal. She has rhonchi in the left lower field.  Neurological: She is alert and oriented to person, place, and time.  Skin: Skin is warm and dry. Capillary refill takes less than 2 seconds. No rash noted. She is not diaphoretic. No erythema.  Psychiatric: She has a normal mood and affect. Her behavior is normal.  Nursing note and vitals reviewed.   Urgent Care Course     ED EKG Date/Time: 02/11/2017 5:00 PM Performed by: Dorena Bodo Authorized by: Dorena Bodo   ECG reviewed by ED Physician in the absence of a cardiologist: no   Previous ECG:    Previous ECG:  Unavailable Interpretation:    Interpretation: abnormal   Rate:    ECG rate:  103   ECG rate assessment: tachycardic   Rhythm:    Rhythm: atrial fibrillation   Ectopy:    Ectopy: none   QRS:    QRS axis:  Normal  Conduction:    Conduction: normal   ST segments:    ST segments:  Normal T waves:    T waves: normal   Comments:     Atrial fibrillation, has past hx of same Rate 103, QRS 80 ms QT/QTc 334/437 ms   (including critical care time)  Labs Review Labs Reviewed  CBC WITH DIFFERENTIAL/PLATELET - Abnormal; Notable for the following:       Result Value   Neutro Abs 8.2 (*)    All other components within normal limits  BASIC METABOLIC PANEL - Abnormal; Notable for the following:    Glucose, Bld 144 (*)    All other components within normal limits  POCT URINALYSIS DIP (DEVICE) - Abnormal; Notable for the following:    Hgb urine dipstick TRACE (*)    Leukocytes, UA TRACE (*)    All other components within normal limits    Imaging Review Dg Chest 2 View  Result Date: 02/11/2017 CLINICAL DATA:  Productive cough. EXAM: CHEST  2 VIEW COMPARISON:  None. FINDINGS: Cardiomediastinal silhouette is enlarged. There is no  evidence of pleural effusion or pneumothorax. Subtle peribronchial airspace consolidation, most pronounced in the left lung base. Osseous structures are without acute abnormality. Soft tissues are grossly normal. IMPRESSION: Subtle peribronchial airspace consolidation, most pronounced in the left lung base. The findings may represent acute bronchitis or developing broncho pneumonia. Electronically Signed   By: Ted Mcalpine M.D.   On: 02/11/2017 16:20       MDM   1. Pneumonitis due to inhalation of food or vomitus (HCC)    Neutrophil count of 8.2, consolidation in the left lung base. Treating for pneumonia, started on Levaquin, continue albuterol neb treatments as necessary, patient's daughter is a Teacher, early years/pre, given counseling on curb 44, guidelines, encouraged her to take her mother to the emergency room at any time if her symptoms worsen. At time of assessment, curb 65 score is 1, total daughter score rises, to consider emergency room and inpatient management. If her symptoms improve, follow up with primary care provider in 2 weeks for reimaging.     Dorena Bodo, NP 02/11/17 1659    Dorena Bodo, NP 02/11/17 [phone]

## 2017-02-11 NOTE — Discharge Instructions (Signed)
I'm treating your mother for pneumonia. I prescribed an antibiotic, Levaquin, take one tablet daily for 5 days. She may continue her albuterol treatments as needed. If at any time her symptoms worsen, go to the emergency room as soon as possible. If her symptoms improve, follow up at her primary care provider in 2 weeks for reimaging to ensure clearance.

## 2017-02-11 NOTE — ED Triage Notes (Signed)
Daughter here w/pt who Speaks [place]  Pt c/o cold sx onset 3 days associated w/prod cough, wheezing, SOB, fever  Had left over Augmentin and prednisone from February   Pt brought back on wheel chair... A&O x4... NAD

## 2017-02-17 ENCOUNTER — Ambulatory Visit: Payer: Medicare Other | Admitting: Occupational Therapy

## 2017-02-17 ENCOUNTER — Ambulatory Visit: Payer: Medicare Other | Attending: Physical Medicine & Rehabilitation | Admitting: Physical Therapy

## 2017-02-17 ENCOUNTER — Encounter: Payer: Self-pay | Admitting: Physical Therapy

## 2017-02-17 VITALS — HR 77

## 2017-02-17 DIAGNOSIS — I69315 Cognitive social or emotional deficit following cerebral infarction: Secondary | ICD-10-CM | POA: Insufficient documentation

## 2017-02-17 DIAGNOSIS — M6281 Muscle weakness (generalized): Secondary | ICD-10-CM

## 2017-02-17 DIAGNOSIS — R42 Dizziness and giddiness: Secondary | ICD-10-CM

## 2017-02-17 DIAGNOSIS — R296 Repeated falls: Secondary | ICD-10-CM | POA: Diagnosis present

## 2017-02-17 DIAGNOSIS — R2689 Other abnormalities of gait and mobility: Secondary | ICD-10-CM | POA: Diagnosis present

## 2017-02-17 DIAGNOSIS — I69354 Hemiplegia and hemiparesis following cerebral infarction affecting left non-dominant side: Secondary | ICD-10-CM

## 2017-02-17 DIAGNOSIS — R2681 Unsteadiness on feet: Secondary | ICD-10-CM

## 2017-02-17 NOTE — Patient Instructions (Addendum)
  Coordination Activities  Perform the following activities for 20 minutes 1 times per day with left hand(s).   Rotate ball in fingertips (clockwise and counter-clockwise).  Toss ball between hands.  Toss ball in air and catch with the same hand.  Flip cards 1 at a time as fast as you can.  Deal cards with your thumb (Hold deck in hand and push card off top with thumb).  Pick up coins and place in container or coin bank.  Pick up coins and stack.                   1. Grip Strengthening (Resistive Putty)   Squeeze putty using thumb and all fingers. Repeat _20___ times. Do __2__ sessions per day.   2. Roll putty into tube on table and pinch between each finger and thumb x 10 reps each. (can do ring and small finger together)     Copyright  V[employer]. All rights reserved.    Seated at table, place both hands on a towel, lean forward to stretch arms out then pull towel back towards you. 10-20 reps 1x day

## 2017-02-17 NOTE — Therapy (Signed)
[facility] [address] [place], [place], [address] Phone: [phone]   Fax:  [phone]  Physical Therapy Treatment  Patient Details  Name: [name] MRN: [id number] Date of Birth: [birth year] Referring Provider: Alysia Penna, MD  Encounter Date: 02/17/2017      PT End of Session - 02/17/17 1226    Visit Number 2   Number of Visits 17   Date for PT Re-Evaluation 04/09/17   Authorization Type Medicare & G-codes with proress note every 10th visit    PT Start Time 0930   PT Stop Time 1017   PT Time Calculation (min) 47 min   Activity Tolerance Patient tolerated treatment well   Behavior During Therapy [facility] for tasks assessed/performed      Past Medical History:  Diagnosis Date  . Atrial fibrillation ([place])   . Chronic back pain    "mid back down into lower back" (08/25/2014)  . Frequent falls   . GERD (gastroesophageal reflux disease)   . Hypertriglyceridemia   . OSA on CPAP   . Osteoarthritis    "knees, hands, back" (08/25/2014)  . Pneumonia ~ 2000 X 1  . Subdural hematoma ([place]) july 2015   S/P fall while on Coumadin  . T12 compression fracture ([place]) 2012  . Type II diabetes mellitus ([place])     Past Surgical History:  Procedure Laterality Date  . APPENDECTOMY  2012  . CATARACT EXTRACTION W/ INTRAOCULAR LENS  IMPLANT, BILATERAL Bilateral 2000's  . IR GENERIC HISTORICAL  10/15/2016   IR PERCUTANEOUS ART THROMBECTOMY/INFUSION INTRACRANIAL INC DIAG ANGIO 10/15/2016 [name], MD [facility]  . IR GENERIC HISTORICAL  12/13/2016   IR RADIOLOGIST EVAL & MGMT 12/13/2016 [facility]  . RADIOLOGY WITH ANESTHESIA N/A 10/15/2016   Procedure: RADIOLOGY WITH ANESTHESIA;  Surgeon: [name], MD;  Location: [facility];  Service: Radiology;  Laterality: N/A;  . TOTAL ABDOMINAL HYSTERECTOMY  1990    Vitals:   02/17/17 0936  Pulse: 77  SpO2: 96%        Subjective Assessment - 02/17/17 0936    Subjective Pt saw physiatrist last week who wrote order for knee brace.  Will be making appointment with orthotist to be fit for brace.  Pt went to urgent care last week due to coughing, fever and fatigue-possible PNA but pt completing antiobiotics and feeling better today.  Still fatigued and SOB with activity.   Patient is accompained by: Family member;Interpreter   Pertinent History falls with subdural hematoma, Afib, chronic back pain, T12 compression fx, type 2 DM, OA, PNA   Limitations Walking;House hold activities;Other (comment);Standing   Patient Stated Goals To treat her weak legs and walk free of AD   Currently in Pain? Yes   Pain Score 5    Pain Location Back   Pain Orientation Lower   Pain Descriptors / Indicators Sore   Pain Type Chronic pain                Vestibular Assessment - 02/17/17 1000      Positional Testing   Dix-Hallpike Dix-Hallpike Right;Dix-Hallpike Left   Horizontal Canal Testing Horizontal Canal Right;Horizontal Canal Left     Dix-Hallpike Right   Dix-Hallpike Right Duration 5-10 seconds   Dix-Hallpike Right Symptoms No nystagmus  but reports vertigo     Dix-Hallpike Left   Dix-Hallpike Left Duration 5-10 seconds   Dix-Hallpike Left Symptoms No nystagmus  but reports vertigo     Horizontal Canal Right   Horizontal  Canal Right Duration 3-5 seconds   Horizontal Canal Right Symptoms Other (comment);Normal  but reports vertigo     Horizontal Canal Left   Horizontal Canal Left Duration 3-5 seconds   Horizontal Canal Left Symptoms Normal;Other (comment)  but reports vertigo                 [facility] Adult PT Treatment/Exercise - 02/17/17 0940      Ambulation/Gait   Stairs Yes   Stairs Assistance 5: Supervision;4: Min guard   Stairs Assistance Details (indicate cue type and reason) Pt initially performed step to and alternating sequences up/down 4 stairs with supervision and bilat UE support on rails.  Due to pt only having one rail  at home transitioned to negotiation with L rail only and step to sequence-RLE to ascend, LLE to descend due to knee pain/weakness   Stair Management Technique Two rails;Alternating pattern;Step to pattern;Forwards;One rail Left   Number of Stairs 8   Height of Stairs 6     Standardized Balance Assessment   Standardized Balance Assessment Timed Up and Go Test     Timed Up and Go Test   TUG Normal TUG   Normal TUG (seconds) 25  without AD and then 28 sec with RW, increased shuffle         Vestibular Treatment/Exercise - 02/17/17 1012      Vestibular Treatment/Exercise   Vestibular Treatment Provided Habituation   Habituation Exercises Legrand Como Daroff   Number of Reps  1   Symptom Description  vertigo L > R             Balance Exercises - 02/17/17 0957      Balance Exercises: Standing   Other Standing Exercises Performed alternating foot taps to 6" step initialy with bilat UE support transitioning to no UE support and then performed laterally to L and R with no UE support but with min A for weight shifting/balance x 10 reps each           PT Education - 02/17/17 1225    Education provided Yes   Education Details possible BPPV, Brandt-Daroff for habituation and self treatment   Person(s) Educated Patient;Child(ren)   Methods Explanation;Demonstration;Handout   Comprehension Verbalized understanding     Habituation - Sit to Side-Lying   Sit on edge of bed. Lie down onto the right side and hold until dizziness stops, plus 20 seconds.  Return to sitting and wait until dizziness stops, plus 20 seconds.  Repeat to the left side. Repeat sequence 5 times per session. Do 2 sessions per day.      PT Short Term Goals - 02/17/17 1232      PT SHORT TERM GOAL #1   Title (TARGET DATE: 03/10/2017) Pt will participate in stair negotiation assessment and vestibular evaluation with goal to be set if needed.   Baseline Need to further assess vestibular   Status  Partially Met     PT SHORT TERM GOAL #2   Title Patient decrease falls risk as indicated by a gait velocity of > or = 1.74f/s with SPC.  (TARGET DATE: 03/10/2017)    Baseline 1.21fsec with cane   Time 4   Period Weeks   Status New     PT SHORT TERM GOAL #3   Title Patient will demonstrate ability to ambulate 250 feet over level surfaces with SPC with 25% cues for increased foot clearance/step length and perform 180 deg turns to L and R  with supervision overall. (TARGET DATE: 03/10/2017)    Time 4   Period Weeks   Status New     PT SHORT TERM GOAL #4   Title TUG will be performed and long term goal to be set. (TARGET DATE: 03/10/2017)    Baseline met 02/17/17   Time 4   Period Weeks   Status Achieved     PT SHORT TERM GOAL #5   Title Pt will improve balance and decrease falls risk as indicated by improvement in BERG score to >or = 37/56   Baseline 30/56   Time 4   Period Weeks   Status New           PT Long Term Goals - 02/17/17 1233      PT LONG TERM GOAL #1   Title Patient will demonstrate independence with HEP and verbalize options for community fitness program. (TARGET DATE: 04/07/2017)   Time 8   Period Weeks   Status New     PT LONG TERM GOAL #2   Title Patient will improve gait velocity to >2.0 ft/sec with SPC to indicate pt is safe for limited community ambulation. (TARGET DATE: 04/07/2017)    Time 8   Period Weeks   Status New     PT LONG TERM GOAL #3   Title Patient will score > or = 42/56 on the Berg Balance Test to indicate decreased falls risk. (TARGET DATE: 04/07/2017)    Time 8   Period Weeks   Status New     PT LONG TERM GOAL #4   Title Pt will decrease falls risk with sit <> stand and turning as indicated by TUG score of <18   Baseline 25   Time 8   Period Weeks   Status New     PT LONG TERM GOAL #5   Title Patient will ambulate 500 feet outside over uneven terrain including ramps/curbs with SPC and will negotiate 3 stairs with SPC, 1 rail with  supervision for community ambulation. (TARGET DATE: 04/07/2017)   Time 8   Period Weeks   Status New     PT LONG TERM GOAL #6   Title Pt will improve LLE strength to 4/5 overall at hip, knee and ankle   Baseline 3+ hip flexion, 4- knee extension, 4 knee flexion, 3- ankle DF   Time 8   Period Weeks   Status New               Plan - 02/17/17 1227    Clinical Impression Statement Pt returns to PT after respiratory illness last week-pt still presents with fatigue, SOB and cough but able to tolerate therapy with multiple rest breaks and able to maintain Sp02>90% and HR between 75-84 bpm.  Pt participated in falls risk assessment with TUG, stair negotiation assessment, balance training and vestibular assessment.  Pt appears to have symptoms consistent with BPPV but no nystagmus seen so no treatment given.  Will re-assess next visit with Ann Lions.  Provided pt with Nestor Lewandowsky for habituation.  Will continue to address.   Rehab Potential Good   Clinical Impairments Affecting Rehab Potential pain of chronic nature   PT Treatment/Interventions ADLs/Self Care Home Management;Canalith Repostioning;Electrical Stimulation;DME Instruction;Gait training;Stair training;Functional mobility training;Therapeutic activities;Therapeutic exercise;Balance training;Neuromuscular re-education;Cognitive remediation;Patient/family education;Orthotic Fit/Training;Energy conservation;Taping;Vestibular   PT Next Visit Plan (I forgot to give pt handout of Brandt-Daroff-please provide).  Assess all canals with frenzel lenses-treat if indicated.  Set LE strength, balance, gait HEP-focus on weight shifting and  foot clearance   Recommended Other Services Pt is getting L knee brace from [employer]-daughter to call and schedule with [employer]   Consulted and Agree with Plan of Care Patient;Family member/caregiver      Patient will benefit from skilled therapeutic intervention in order to improve the following deficits and  impairments:  Abnormal gait, Decreased endurance, Decreased balance, Decreased cognition, Decreased mobility, Decreased strength, Difficulty walking, Dizziness, Impaired sensation, Pain  Visit Diagnosis: Muscle weakness (generalized)  Unsteadiness on feet  Other abnormalities of gait and mobility  Hemiplegia and hemiparesis following cerebral infarction affecting left non-dominant side (HCC)  Repeated falls  Dizziness and giddiness     Problem List Patient Active Problem List   Diagnosis Date Noted  . Primary osteoarthritis of left knee 02/09/2017  . SDH (subdural hematoma) ([place])   . Urinary frequency   . Type 2 diabetes mellitus with peripheral neuropathy (HCC)   . Cough   . Gait disturbance, post-stroke 10/20/2016  . Hemiparesis affecting left side as late effect of stroke ([place]) 10/20/2016  . Essential hypertension 10/19/2016  . Hyperlipidemia LDL goal <70 10/19/2016  . 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 Left hemiparesis ([place])   . Atrial fibrillation ([place])   . History of fall   . History of traumatic subdural hematoma   . Vascular headache   . Hypokalemia   . Leukocytosis   . Acute blood loss anemia   . Hypoalbuminemia due to protein-calorie malnutrition ([place])   . Dysphagia, post-stroke   . Acute respiratory failure ([place])   . Acute embolic stroke (HCC) - R putamen/caudate and R insular infarcts s/p TICI3 revascularization w/ mechanical thrombectomy d/t AF not on [facility] 10/15/2016  . Pressure injury of skin 10/15/2016  . HCAP (healthcare-associated pneumonia) 11/09/2014  . Weakness 08/25/2014  . Acute bronchitis 08/25/2014  . UTI (lower urinary tract infection) 08/25/2014  . Fracture of lumbar spine ([place]) 08/25/2014  . Subdural hematoma, post-traumatic ([place]) 05/12/2014  . Chronic atrial fibrillation ([place]) 05/12/2014  . Diabetes mellitus ([place]) 05/12/2014  . OSA on CPAP 05/12/2014   Raylene Everts, PT, DPT 02/17/17    12:37 PM    [facility] [address] [place], [place], [address] Phone: [phone]   Fax:  [phone]  Name: [name] MRN: [id number] Date of Birth: Jan 24, 1941

## 2017-02-17 NOTE — Patient Instructions (Signed)
Habituation - Tip Card  1.The goal of habituation training is to assist in decreasing symptoms of vertigo, dizziness, or nausea provoked by specific head and body motions. 2.These exercises may initially increase symptoms; however, be persistent and work through symptoms. With repetition and time, the exercises will assist in reducing or eliminating symptoms. 3.Exercises should be stopped and discussed with the therapist if you experience any of the following: - Sudden change or fluctuation in hearing - New onset of ringing in the ears, or increase in current intensity - Any fluid discharge from the ear - Severe pain in neck or back - Extreme nausea  Copyright  VHI. All rights reserved.   Habituation - Rolling   With pillow under head, start on back. Roll to your right side.  Hold until dizziness stops, plus 20 seconds and then roll to the left side.  Hold until dizziness stops, plus 20 seconds.  Repeat sequence 5 times per session. Do 2 sessions per day.    Habituation - Sit to Side-Lying   Sit on edge of bed. Lie down onto the right side and hold until dizziness stops, plus 20 seconds.  Return to sitting and wait until dizziness stops, plus 20 seconds.  Repeat to the left side. Repeat sequence 5 times per session. Do 2 sessions per day.

## 2017-02-17 NOTE — Therapy (Signed)
[facility] [address] [place], [place], [address] Phone: [phone]   Fax:  [phone]  Occupational Therapy Treatment  Patient Details  Name: [name] MRN: [id number] Date of Birth: October 15, 1941 Referring Provider: Dr. Wynn Banker  Encounter Date: 02/17/2017      OT End of Session - 02/17/17 1050    Visit Number 2   Number of Visits 17   Authorization Type Medicare/ Medicaid   Authorization Time Period 60 days   Authorization - Visit Number 2   Authorization - Number of Visits 10   OT Start Time 1021   OT Stop Time 1100   OT Time Calculation (min) 39 min      Past Medical History:  Diagnosis Date  . Atrial fibrillation (HCC)   . Chronic back pain    "mid back down into lower back" (08/25/2014)  . Frequent falls   . GERD (gastroesophageal reflux disease)   . Hypertriglyceridemia   . OSA on CPAP   . Osteoarthritis    "knees, hands, back" (08/25/2014)  . Pneumonia ~ 2000 X 1  . Subdural hematoma (HCC) july 2015   S/P fall while on Coumadin  . T12 compression fracture (HCC) 2012  . Type II diabetes mellitus (HCC)     Past Surgical History:  Procedure Laterality Date  . APPENDECTOMY  2012  . CATARACT EXTRACTION W/ INTRAOCULAR LENS  IMPLANT, BILATERAL Bilateral 2000's  . IR GENERIC HISTORICAL  10/15/2016   IR PERCUTANEOUS ART THROMBECTOMY/INFUSION INTRACRANIAL INC DIAG ANGIO 10/15/2016 [name], MD [facility]INTER[facility] RAD  . IR GENERIC HISTORICAL  12/13/2016   IR RADIOLOGIST EVAL & MGMT 12/13/2016 [facility]  . RADIOLOGY WITH ANESTHESIA N/A 10/15/2016   Procedure: RADIOLOGY WITH ANESTHESIA;  Surgeon: [name], MD;  [facility];  Service: Radiology;  Laterality: N/A;  . TOTAL ABDOMINAL HYSTERECTOMY  1990    There were no vitals filed for this visit.      Subjective Assessment - 02/17/17 1306    Subjective  Pt's dtr reports she is just getting over pna   Pertinent History R MCA CVA, hx of  fall with subdural hematoma, A-fib, PNA   Patient Stated Goals return to cooking   Currently in Pain? Yes   Pain Score 5    Pain Location Back   Pain Orientation Lower   Pain Descriptors / Indicators Sore   Pain Type Chronic pain   Pain Onset More than a month ago   Pain Frequency Constant   Aggravating Factors  standing   Pain Relieving Factors rest pain meds                              OT Education - 02/17/17 1306    Education provided Yes   Education Details coordination, putty HEP, table slides for shoulder flexion, see pt instructions   Person(s) Educated Patient;Child(ren)  dtr   Methods Explanation;Demonstration;Verbal cues;Handout   Comprehension Verbalized understanding;Returned demonstration;Verbal cues required  interpreter present          OT Short Term Goals - 02/17/17 1027      OT SHORT TERM GOAL #1   Title Pt/ caregiver will be I with HEP. due 03/09/17   Time 4   Period Weeks   Status New     OT SHORT TERM GOAL #2   Title Pt will perform simple cooking/ home management in standing x 25 mins without rest break or LOB.  Baseline 10-15 mins   Time 4   Period Weeks   Status New     OT SHORT TERM GOAL #3   Title Pt/ caregiver will verbalize understanding of compensatory stategies for short term memory deficits.   Time 4   Period Weeks   Status New     OT SHORT TERM GOAL #4   Title Test standing functional reach and set goal prn   Time 4   Period Weeks   Status New     OT SHORT TERM GOAL #5   Title Pt will decrease    Baseline RUE 24.50 secs LUE 32.75 secs   Time 4   Period Weeks   Status New           OT Long Term Goals - 02/08/17 1440      OT LONG TERM GOAL #1   Title Pt will perfom mod complex cooking/ home management in standing x 45 mins without rest break or LOB. due 04/08/17   Time 8   Period Weeks   Status New     OT LONG TERM GOAL #2   Title Pt will increase bilateral grip strength to 25 lbs or greater  for increased functional use for ADLs.   Time 8   Period Weeks   Status New     OT LONG TERM GOAL #3   Title Pt will demonstrate ability to retrieve 2 lbs from overhead shelf at 125 without drops with RUE   Baseline 120   Time 8   Period Weeks   Status New     OT LONG TERM GOAL #4   Title Pt will demonstrate ability to retrieve 2 lbs item from ovehead shelf at 120 shoulder flexion with LUE   Baseline 110   Time 8   Period Weeks   Status New               Plan - 02/17/17 1307    Clinical Impression Statement Pt is progressing towards goals for HEP. Pt's dtr and and interpreter were present for session. Pt's dtr demonstrates understanding of HEP and she is able to assist her mother.   Rehab Potential Fair   OT Frequency 2x / week   OT Duration 8 weeks   OT Treatment/Interventions Self-care/ADL training;Moist Heat;Fluidtherapy;DME and/or AE instruction;Splinting;Patient/family education;Balance training;Therapeutic exercises;Ultrasound;Therapeutic exercise;Therapeutic activities;Cognitive remediation/compensation;Passive range of motion;Functional Mobility Training;Neuromuscular education;Cryotherapy;Electrical Stimulation;Parrafin;Energy conservation;Manual Therapy   Plan check standing functional reach, ball exercises in supine   Consulted and Agree with Plan of Care Patient      Patient will benefit from skilled therapeutic intervention in order to improve the following deficits and impairments:  Abnormal gait, Decreased cognition, Decreased knowledge of use of DME, Pain, Impaired sensation, Decreased mobility, Decreased coordination, Decreased activity tolerance, Decreased endurance, Decreased range of motion, Decreased strength, Impaired UE functional use, Impaired perceived functional ability, Difficulty walking, Decreased safety awareness, Decreased knowledge of precautions, Decreased balance  Visit Diagnosis: Muscle weakness (generalized)  Hemiplegia and hemiparesis  following cerebral infarction affecting left non-dominant side (HCC)  Cognitive social or emotional deficit following cerebral infarction    Problem List Patient Active Problem List   Diagnosis Date Noted  . Primary osteoarthritis of left knee 02/09/2017  . SDH (subdural hematoma) (HCC)   . Urinary frequency   . Type 2 diabetes mellitus with peripheral neuropathy (HCC)   . Cough   . Gait disturbance, post-stroke 10/20/2016  . Hemiparesis affecting left side as late effect of stroke (HCC) 10/20/2016  .  Essential hypertension 10/19/2016  . Hyperlipidemia LDL goal <70 10/19/2016  . 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  . Left hemiparesis (HCC)   . Atrial fibrillation (HCC)   . History of fall   . History of traumatic subdural hematoma   . Vascular headache   . Hypokalemia   . Leukocytosis   . Acute blood loss anemia   . Hypoalbuminemia due to protein-calorie malnutrition (HCC)   . Dysphagia, post-stroke   . Acute respiratory failure (HCC)   . Acute embolic stroke (HCC) - R putamen/caudate and R insular infarcts s/p TICI3 revascularization w/ mechanical thrombectomy d/t AF not on [facility] 10/15/2016  . Pressure injury of skin 10/15/2016  . HCAP (healthcare-associated pneumonia) 11/09/2014  . Weakness 08/25/2014  . Acute bronchitis 08/25/2014  . UTI (lower urinary tract infection) 08/25/2014  . Fracture of lumbar spine (HCC) 08/25/2014  . Subdural hematoma, post-traumatic (HCC) 05/12/2014  . Chronic atrial fibrillation (HCC) 05/12/2014  . Diabetes mellitus (HCC) 05/12/2014  . OSA on CPAP 05/12/2014    RINE,KATHRYN 02/17/2017, 1:18 PM Keene Breath, OTR/L [phone] Phone: [phone] 1:18 PM 02/17/17 [facility] [address] [place], [place], [address] Phone: [phone]   Fax:  [phone]  Name: [name] MRN: [id number] Date of Birth: 04/07/1941

## 2017-02-22 ENCOUNTER — Ambulatory Visit: Payer: Medicare Other | Admitting: Occupational Therapy

## 2017-02-22 DIAGNOSIS — I69354 Hemiplegia and hemiparesis following cerebral infarction affecting left non-dominant side: Secondary | ICD-10-CM

## 2017-02-22 DIAGNOSIS — I69315 Cognitive social or emotional deficit following cerebral infarction: Secondary | ICD-10-CM

## 2017-02-22 DIAGNOSIS — M6281 Muscle weakness (generalized): Secondary | ICD-10-CM

## 2017-02-22 NOTE — Therapy (Signed)
[facility] [address] [place], [place], [address] Phone: [phone]   Fax:  [phone]  Occupational Therapy Treatment  Patient Details  Name: [name] MRN: [id number] Date of Birth: July 29, 1941 Referring Provider: Dr. Wynn Banker  Encounter Date: 02/22/2017      OT End of Session - 02/22/17 1649    Visit Number 3   Number of Visits 17   Date for OT Re-Evaluation 04/08/17   Authorization Type Medicare/ Medicaid   Authorization Time Period 60 days   Authorization - Visit Number 3   Authorization - Number of Visits 10   OT Start Time 1535   OT Stop Time 1615   OT Time Calculation (min) 40 min      Past Medical History:  Diagnosis Date  . Atrial fibrillation (HCC)   . Chronic back pain    "mid back down into lower back" (08/25/2014)  . Frequent falls   . GERD (gastroesophageal reflux disease)   . Hypertriglyceridemia   . OSA on CPAP   . Osteoarthritis    "knees, hands, back" (08/25/2014)  . Pneumonia ~ 2000 X 1  . Subdural hematoma (HCC) july 2015   S/P fall while on Coumadin  . T12 compression fracture (HCC) 2012  . Type II diabetes mellitus (HCC)     Past Surgical History:  Procedure Laterality Date  . APPENDECTOMY  2012  . CATARACT EXTRACTION W/ INTRAOCULAR LENS  IMPLANT, BILATERAL Bilateral 2000's  . IR GENERIC HISTORICAL  10/15/2016   IR PERCUTANEOUS ART THROMBECTOMY/INFUSION INTRACRANIAL INC DIAG ANGIO 10/15/2016 [name], MD [facility]INTER[facility] RAD  . IR GENERIC HISTORICAL  12/13/2016   IR RADIOLOGIST EVAL & MGMT 12/13/2016 [facility]  . RADIOLOGY WITH ANESTHESIA N/A 10/15/2016   Procedure: RADIOLOGY WITH ANESTHESIA;  Surgeon: [name], MD;  Location: [facility] OR;  Service: Radiology;  Laterality: N/A;  . TOTAL ABDOMINAL HYSTERECTOMY  1990    There were no vitals filed for this visit.      Subjective Assessment - 02/22/17 1640    Subjective  Pt's dtr reports swelling in left knee   Pertinent History R MCA CVA, hx of fall with subdural hematoma, A-fib, PNA   Patient Stated Goals return to cooking   Currently in Pain? Yes   Pain Score 6    Pain Location Knee   Pain Orientation Left   Pain Descriptors / Indicators Sore   Pain Type Acute pain   Pain Onset In the past 7 days   Pain Frequency Constant   Aggravating Factors  standing   Pain Relieving Factors rest   Multiple Pain Sites No                 Therapist discussed s/s of DVT along with PT who assisted with assessing pt risk. Pt's RLE circumference 31.2, LLE 32 inches. Pt appears to have injured her LLE, yet it does not appear to be a DVT. AA/ROM shoulder flexion closed chain bilaterally with mod compensations, unilateral AA/ROM in supine with therapist assisting with ROM. Arm bike x 3 mins level 1 for conditioning.             OT Education - 02/22/17 1642    Education provided Yes   Education Details supine chest press and shoulder flexion AA/ROM with hand supported on cane in seated, s/s of DVT   Person(s) Educated Patient;Child(ren)   Methods Explanation;Demonstration;Verbal cues;Handout   Comprehension Verbalized understanding;Returned demonstration;Verbal cues required  OT Short Term Goals - 02/22/17 1645      OT SHORT TERM GOAL #1   Title Pt/ caregiver will be I with HEP. due 03/09/17   Time 4   Period Weeks   Status New     OT SHORT TERM GOAL #2   Title Pt will perform simple cooking/ home management in standing x 25 mins without rest break or LOB.   Baseline 10-15 mins   Time 4   Period Weeks   Status New     OT SHORT TERM GOAL #3   Title Pt/ caregiver will verbalize understanding of compensatory stategies for short term memory deficits.   Time 4   Period Weeks   Status New     OT SHORT TERM GOAL #4   Title Test standing functional reach and set goal prn   Time 4   Period Weeks   Status Deferred  due to knee pain and communication difficulties     OT  SHORT TERM GOAL #5   Title Pt will demonstrate improved fine motor coordination for ADLS as evidenced by decreasing LUE 9 hole peg test score to 29 secs or less.   Baseline RUE 24.50 secs LUE 32.75 secs   Time 4   Period Weeks   Status New           OT Long Term Goals - 02/08/17 1440      OT LONG TERM GOAL #1   Title Pt will perfom mod complex cooking/ home management in standing x 45 mins without rest break or LOB. due 04/08/17   Time 8   Period Weeks   Status New     OT LONG TERM GOAL #2   Title Pt will increase bilateral grip strength to 25 lbs or greater for increased functional use for ADLs.   Time 8   Period Weeks   Status New     OT LONG TERM GOAL #3   Title Pt will demonstrate ability to retrieve 2 lbs from overhead shelf at 125 without drops with RUE   Baseline 120   Time 8   Period Weeks   Status New     OT LONG TERM GOAL #4   Title Pt will demonstrate ability to retrieve 2 lbs item from ovehead shelf at 120 shoulder flexion with LUE   Baseline 110   Time 8   Period Weeks   Status New               Plan - 02/22/17 1643    Clinical Impression Statement Pt is progressing towards goals. She has learning compensatory shoulder hiking  with shoulder flexion and requires facilitation to avoid malpositioning.   Rehab Potential Fair   OT Frequency 2x / week   OT Duration 8 weeks   OT Treatment/Interventions Self-care/ADL training;Moist Heat;Fluidtherapy;DME and/or AE instruction;Splinting;Patient/family education;Balance training;Therapeutic exercises;Ultrasound;Therapeutic exercise;Therapeutic activities;Cognitive remediation/compensation;Passive range of motion;Functional Mobility Training;Neuromuscular education;Cryotherapy;Electrical Stimulation;Parrafin;Energy conservation;Manual Therapy   Plan check to see how ball exercises are going   Family Member Consulted daughter      Patient will benefit from skilled therapeutic intervention in order to improve  the following deficits and impairments:  Abnormal gait, Decreased cognition, Decreased knowledge of use of DME, Pain, Impaired sensation, Decreased mobility, Decreased coordination, Decreased activity tolerance, Decreased endurance, Decreased range of motion, Decreased strength, Impaired UE functional use, Impaired perceived functional ability, Difficulty walking, Decreased safety awareness, Decreased knowledge of precautions, Decreased balance  Visit Diagnosis: Muscle weakness (generalized)  Hemiplegia and  hemiparesis following cerebral infarction affecting left non-dominant side (HCC)  Cognitive social or emotional deficit following cerebral infarction    Problem List Patient Active Problem List   Diagnosis Date Noted  . Primary osteoarthritis of left knee 02/09/2017  . SDH (subdural hematoma) (HCC)   . Urinary frequency   . Type 2 diabetes mellitus with peripheral neuropathy (HCC)   . Cough   . Gait disturbance, post-stroke 10/20/2016  . Hemiparesis affecting left side as late effect of stroke (HCC) 10/20/2016  . Essential hypertension 10/19/2016  . Hyperlipidemia LDL goal <70 10/19/2016  . 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  . Left hemiparesis (HCC)   . Atrial fibrillation (HCC)   . History of fall   . History of traumatic subdural hematoma   . Vascular headache   . Hypokalemia   . Leukocytosis   . Acute blood loss anemia   . Hypoalbuminemia due to protein-calorie malnutrition (HCC)   . Dysphagia, post-stroke   . Acute respiratory failure (HCC)   . Acute embolic stroke (HCC) - R putamen/caudate and R insular infarcts s/p TICI3 revascularization w/ mechanical thrombectomy d/t [facility] 10/15/2016  . Pressure injury of skin 10/15/2016  . HCAP (healthcare-associated pneumonia) 11/09/2014  . Weakness 08/25/2014  . Acute bronchitis 08/25/2014  . UTI (lower urinary tract infection) 08/25/2014  . Fracture of lumbar spine (HCC) 08/25/2014  . Subdural hematoma,  post-traumatic (HCC) 05/12/2014  . Chronic atrial fibrillation (HCC) 05/12/2014  . Diabetes mellitus (HCC) 05/12/2014  . OSA on CPAP 05/12/2014    Alon Mazor 02/22/2017, 4:50 PM  [facility] [address] [place], [place], [address] Phone: [phone]   Fax:  [phone]  Name: [name] MRN: [id number] Date of Birth: 04/16/1941

## 2017-02-22 NOTE — Patient Instructions (Signed)
Laying on your back, hold a foam noodle or a ball to raise arms towards the ceiling then lower with elbows bent(chest press) 10-15 reps 1-2 x day Seated hold the top of your cane with left hand, push arm out to straighten elbow then bring cane towards you by bending elbow, 10-15 reps 1-2 xday, (perform with right arm too)

## 2017-02-23 ENCOUNTER — Encounter: Payer: Self-pay | Admitting: Physical Therapy

## 2017-02-23 ENCOUNTER — Ambulatory Visit: Payer: Medicare Other | Admitting: Occupational Therapy

## 2017-02-23 ENCOUNTER — Ambulatory Visit: Payer: Medicare Other | Admitting: Physical Therapy

## 2017-02-23 DIAGNOSIS — R42 Dizziness and giddiness: Secondary | ICD-10-CM

## 2017-02-23 DIAGNOSIS — I69354 Hemiplegia and hemiparesis following cerebral infarction affecting left non-dominant side: Secondary | ICD-10-CM

## 2017-02-23 DIAGNOSIS — M6281 Muscle weakness (generalized): Secondary | ICD-10-CM

## 2017-02-23 DIAGNOSIS — R296 Repeated falls: Secondary | ICD-10-CM

## 2017-02-23 DIAGNOSIS — R2689 Other abnormalities of gait and mobility: Secondary | ICD-10-CM

## 2017-02-23 DIAGNOSIS — R2681 Unsteadiness on feet: Secondary | ICD-10-CM

## 2017-02-23 DIAGNOSIS — I69315 Cognitive social or emotional deficit following cerebral infarction: Secondary | ICD-10-CM

## 2017-02-23 NOTE — Therapy (Signed)
[facility] [address] [place], [place], [address] Phone: [phone]   Fax:  [phone]  Occupational Therapy Treatment  Patient Details  Name: Lauren RumpfHue [name] MRN: [id number] Date of Birth: 07/24/1941 Referring Provider: Dr. Wynn BankerKirsteins  Encounter Date: 02/23/2017      OT End of Session - 02/23/17 1446    Visit Number 4   Number of Visits 17   Date for OT Re-Evaluation 04/08/17   Authorization Type Medicare/ Medicaid   Authorization Time Period 60 days   Authorization - Visit Number 4   Authorization - Number of Visits 10   OT Start Time 1406   OT Stop Time 1445   OT Time Calculation (min) 39 min   Activity Tolerance Patient tolerated treatment well   Behavior During Therapy [facility] for tasks assessed/performed      Past Medical History:  Diagnosis Date  . Atrial fibrillation (HCC)   . Chronic back pain    "mid back down into lower back" (08/25/2014)  . Frequent falls   . GERD (gastroesophageal reflux disease)   . Hypertriglyceridemia   . OSA on CPAP   . Osteoarthritis    "knees, hands, back" (08/25/2014)  . Pneumonia ~ 2000 X 1  . Subdural hematoma (HCC) july 2015   S/P fall while on Coumadin  . T12 compression fracture (HCC) 2012  . Type II diabetes mellitus (HCC)     Past Surgical History:  Procedure Laterality Date  . APPENDECTOMY  2012  . CATARACT EXTRACTION W/ INTRAOCULAR LENS  IMPLANT, BILATERAL Bilateral 2000's  . IR GENERIC HISTORICAL  10/15/2016   IR PERCUTANEOUS ART THROMBECTOMY/INFUSION INTRACRANIAL INC DIAG ANGIO 10/15/2016 Julieanne CottonSanjeev Deveshwar, MD [facility]  . IR GENERIC HISTORICAL  12/13/2016   IR RADIOLOGIST EVAL & MGMT 12/13/2016 [facility]  . RADIOLOGY WITH ANESTHESIA N/A 10/15/2016   Procedure: RADIOLOGY WITH ANESTHESIA;  Surgeon: Julieanne CottonSanjeev Deveshwar, MD;  Location: [facility] OR;  Service: Radiology;  Laterality: N/A;  . TOTAL ABDOMINAL HYSTERECTOMY  1990    There were no vitals filed for this  visit.      Subjective Assessment - 02/23/17 1417    Subjective  Pt reports pain is better after PT   Pertinent History R MCA CVA, hx of fall with subdural hematoma, A-fib, PNA   Patient Stated Goals return to cooking   Currently in Pain? Yes   Pain Score 4    Pain Location Knee   Pain Orientation Left   Pain Descriptors / Indicators Aching   Pain Type Chronic pain   Pain Onset In the past 7 days   Pain Frequency Constant   Aggravating Factors  standing   Pain Relieving Factors rest   Multiple Pain Sites No                   supine chest press, shoulder flexion closed chain with ball, supine over towel roll with arms in abduction for gentle stretch to promote improved posture  shoulder flexion AA/ROM with hand supported on cane in seated, for bilateral UE's min v.c./ facilitation Standing to fold laundry with bilateral UE's, no LOB, 1 rest break. Functional reaching with LUE in standing to place graded clothespins on vertical antennae with LUE, min-mod difficulty, min v.c. For proper positioning. Arm bike x 4 mins level 1 for reciprocal movement.            OT Education - 02/22/17 1642    Education provided Yes   Education Details s/s of DVT  Person(s) Educated Patient;Child(ren)   Methods Explanation;Demonstration;Verbal cues;Handout   Comprehension Verbalized understanding;Returned demonstration;Verbal cues required          OT Short Term Goals - 02/22/17 1645      OT SHORT TERM GOAL #1   Title Pt/ caregiver will be I with HEP. due 03/09/17   Time 4   Period Weeks   Status New     OT SHORT TERM GOAL #2   Title Pt will perform simple cooking/ home management in standing x 25 mins without rest break or LOB.   Baseline 10-15 mins   Time 4   Period Weeks   Status New     OT SHORT TERM GOAL #3   Title Pt/ caregiver will verbalize understanding of compensatory stategies for short term memory deficits.   Time 4   Period Weeks   Status New      OT SHORT TERM GOAL #4   Title Test standing functional reach and set goal prn   Time 4   Period Weeks   Status Deferred  due to knee pain and communication difficulties     OT SHORT TERM GOAL #5   Title Pt will demonstrate improved fine motor coordination for ADLS as evidenced by decreasing LUE 9 hole peg test score to 29 secs or less.   Baseline RUE 24.50 secs LUE 32.75 secs   Time 4   Period Weeks   Status New           OT Long Term Goals - 02/08/17 1440      OT LONG TERM GOAL #1   Title Pt will perfom mod complex cooking/ home management in standing x 45 mins without rest break or LOB. due 04/08/17   Time 8   Period Weeks   Status New     OT LONG TERM GOAL #2   Title Pt will increase bilateral grip strength to 25 lbs or greater for increased functional use for ADLs.   Time 8   Period Weeks   Status New     OT LONG TERM GOAL #3   Title Pt will demonstrate ability to retrieve 2 lbs from overhead shelf at 125 without drops with RUE   Baseline 120   Time 8   Period Weeks   Status New     OT LONG TERM GOAL #4   Title Pt will demonstrate ability to retrieve 2 lbs item from ovehead shelf at 120 shoulder flexion with LUE   Baseline 110   Time 8   Period Weeks   Status New               Plan - 02/23/17 1445    Clinical Impression Statement Pt is progressing towards goals. she demonstrates improving LUE with functional activity   Rehab Potential Fair   OT Frequency 2x / week   OT Duration 8 weeks   OT Treatment/Interventions Self-care/ADL training;Moist Heat;Fluidtherapy;DME and/or AE instruction;Splinting;Patient/family education;Balance training;Therapeutic exercises;Ultrasound;Therapeutic exercise;Therapeutic activities;Cognitive remediation/compensation;Passive range of motion;Functional Mobility Training;Neuromuscular education;Cryotherapy;Electrical Stimulation;Parrafin;Energy conservation;Manual Therapy   Plan simple cooking task, continue to reinforce  functional use of LUE   Consulted and Agree with Plan of Care Patient      Patient will benefit from skilled therapeutic intervention in order to improve the following deficits and impairments:  Abnormal gait, Decreased cognition, Decreased knowledge of use of DME, Pain, Impaired sensation, Decreased mobility, Decreased coordination, Decreased activity tolerance, Decreased endurance, Decreased range of motion, Decreased strength, Impaired UE functional use, Impaired perceived  functional ability, Difficulty walking, Decreased safety awareness, Decreased knowledge of precautions, Decreased balance  Visit Diagnosis: Muscle weakness (generalized)  Hemiplegia and hemiparesis following cerebral infarction affecting left non-dominant side (HCC)  Cognitive social or emotional deficit following cerebral infarction  Unsteadiness on feet    Problem List Patient Active Problem List   Diagnosis Date Noted  . Primary osteoarthritis of left knee 02/09/2017  . SDH (subdural hematoma) (HCC)   . Urinary frequency   . Type 2 diabetes mellitus with peripheral neuropathy (HCC)   . Cough   . Gait disturbance, post-stroke 10/20/2016  . Hemiparesis affecting left side as late effect of stroke (HCC) 10/20/2016  . Essential hypertension 10/19/2016  . Hyperlipidemia LDL goal <70 10/19/2016  . 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  . Left hemiparesis (HCC)   . Atrial fibrillation (HCC)   . History of fall   . History of traumatic subdural hematoma   . Vascular headache   . Hypokalemia   . Leukocytosis   . Acute blood loss anemia   . Hypoalbuminemia due to protein-calorie malnutrition (HCC)   . Dysphagia, post-stroke   . Acute respiratory failure (HCC)   . Acute embolic stroke (HCC) - R putamen/caudate and R insular infarcts s/p TICI3 revascularization w/ mechanical thrombectomy d/t AF not on [facility] 10/15/2016  . Pressure injury of skin 10/15/2016  . HCAP (healthcare-associated pneumonia) 11/09/2014  .  Weakness 08/25/2014  . Acute bronchitis 08/25/2014  . UTI (lower urinary tract infection) 08/25/2014  . Fracture of lumbar spine (HCC) 08/25/2014  . Subdural hematoma, post-traumatic (HCC) 05/12/2014  . Chronic atrial fibrillation (HCC) 05/12/2014  . Diabetes mellitus (HCC) 05/12/2014  . OSA on CPAP 05/12/2014    RINE,KATHRYN 02/23/2017, 2:47 PM  [facility] [address] [place], [place], [address] Phone: [phone]   Fax:  [phone]  Name: [name] MRN: [id number] Date of Birth: April 16, 1941

## 2017-02-23 NOTE — Therapy (Signed)
[facility] [address] [place], [place], [address] Phone: [phone]   Fax:  [phone]  Physical Therapy Treatment  Patient Details  Name: [name] MRN: [id number] Date of Birth: [birth year] Referring Provider: Alysia Penna, MD  Encounter Date: 02/23/2017      PT End of Session - 02/23/17 1709    Visit Number 3   Number of Visits 17   Date for PT Re-Evaluation 04/09/17   Authorization Type Medicare & G-codes with proress note every 10th visit    PT Start Time 1321   PT Stop Time 1402   PT Time Calculation (min) 41 min   Activity Tolerance Patient tolerated treatment well   Behavior During Therapy [facility] for tasks assessed/performed      Past Medical History:  Diagnosis Date  . Atrial fibrillation ([place])   . Chronic back pain    "mid back down into lower back" (08/25/2014)  . Frequent falls   . GERD (gastroesophageal reflux disease)   . Hypertriglyceridemia   . OSA on CPAP   . Osteoarthritis    "knees, hands, back" (08/25/2014)  . Pneumonia ~ 2000 X 1  . Subdural hematoma ([place]) july 2015   S/P fall while on Coumadin  . T12 compression fracture ([place]) 2012  . Type II diabetes mellitus ([place])     Past Surgical History:  Procedure Laterality Date  . APPENDECTOMY  2012  . CATARACT EXTRACTION W/ INTRAOCULAR LENS  IMPLANT, BILATERAL Bilateral 2000's  . IR GENERIC HISTORICAL  10/15/2016   IR PERCUTANEOUS ART THROMBECTOMY/INFUSION INTRACRANIAL INC DIAG ANGIO 10/15/2016 [name], MD [facility]  . IR GENERIC HISTORICAL  12/13/2016   IR RADIOLOGIST EVAL & MGMT 12/13/2016 [facility]  . RADIOLOGY WITH ANESTHESIA N/A 10/15/2016   Procedure: RADIOLOGY WITH ANESTHESIA;  Surgeon: [name], MD;  Location: [facility];  Service: Radiology;  Laterality: N/A;  . TOTAL ABDOMINAL HYSTERECTOMY  1990    There were no vitals filed for this visit.      Subjective Assessment - 02/23/17 1329    Subjective L  knee continues to be swollen and painful from yesterday; has not taken any pain medication.  Walking with quad cane today.   Patient is accompained by: Family member   Pertinent History falls with subdural hematoma, Afib, chronic back pain, T12 compression fx, type 2 DM, OA, PNA   Limitations Walking;House hold activities;Other (comment);Standing   Patient Stated Goals To treat her weak legs and walk free of AD   Currently in Pain? Yes   Pain Score 8    Pain Location Knee   Pain Orientation Left   Pain Descriptors / Indicators Aching   Pain Type Chronic pain                Vestibular Assessment - 02/23/17 1347      Dix-Hallpike Right   Dix-Hallpike Right Duration 15 seconds vertigo, no nystagmus   Dix-Hallpike Right Symptoms No nystagmus     Dix-Hallpike Left   Dix-Hallpike Left Duration 5 seconds, mild vertigo   Dix-Hallpike Left Symptoms No nystagmus                 [facility] Adult PT Treatment/Exercise - 02/23/17 1707      Self-Care   Self-Care Heat/Ice Application   Heat/Ice Application to L knee due to pain and edema-also performed figure 8 ace wrapping around L knee for support and edema reduction         Vestibular Treatment/Exercise -  02/23/17 1347      Vestibular Treatment/Exercise   Vestibular Treatment Provided Canalith Repositioning   Canalith Repositioning Epley Manuever Right      EPLEY MANUEVER RIGHT   Number of Reps  2   Overall Response Improved Symptoms   Response Details  Decreased dizziness after second repetition            Balance Exercises - 02/23/17 1355      Balance Exercises: Standing   Other Standing Exercises Performed alternating foot taps to 6" step initialy with bilat UE support transitioning to no UE support and then performed laterally to L and R with no UE support but with min A for weight shifting/balance x 10 reps each.  Required tactile cues for weight shifting and WB through LLE as well as manual support at L  knee to prevent buckling.           PT Education - 02/23/17 1708    Education provided Yes   Education Details provided habituation handout; education on BPPV, ice to L knee and mechanics of hip ER on medial L knee during gait and sit <> stand   Person(s) Educated Patient;Child(ren)   Methods Explanation;Demonstration;Handout   Comprehension Verbalized understanding          PT Short Term Goals - 02/17/17 1232      PT SHORT TERM GOAL #1   Title (TARGET DATE: 03/10/2017) Pt will participate in stair negotiation assessment and vestibular evaluation with goal to be set if needed.   Baseline Need to further assess vestibular   Status Partially Met     PT SHORT TERM GOAL #2   Title Patient decrease falls risk as indicated by a gait velocity of > or = 1.21f/s with SPC.  (TARGET DATE: 03/10/2017)    Baseline 1.224fsec with cane   Time 4   Period Weeks   Status New     PT SHORT TERM GOAL #3   Title Patient will demonstrate ability to ambulate 250 feet over level surfaces with SPC with 25% cues for increased foot clearance/step length and perform 180 deg turns to L and R with supervision overall. (TARGET DATE: 03/10/2017)    Time 4   Period Weeks   Status New     PT SHORT TERM GOAL #4   Title TUG will be performed and long term goal to be set. (TARGET DATE: 03/10/2017)    Baseline met 02/17/17   Time 4   Period Weeks   Status Achieved     PT SHORT TERM GOAL #5   Title Pt will improve balance and decrease falls risk as indicated by improvement in BERG score to >or = 37/56   Baseline 30/56   Time 4   Period Weeks   Status New           PT Long Term Goals - 02/17/17 1233      PT LONG TERM GOAL #1   Title Patient will demonstrate independence with HEP and verbalize options for community fitness program. (TARGET DATE: 04/07/2017)   Time 8   Period Weeks   Status New     PT LONG TERM GOAL #2   Title Patient will improve gait velocity to >2.0 ft/sec with SPC to indicate  pt is safe for limited community ambulation. (TARGET DATE: 04/07/2017)    Time 8   Period Weeks   Status New     PT LONG TERM GOAL #3   Title Patient will score > or =  42/56 on the Berg Balance Test to indicate decreased falls risk. (TARGET DATE: 04/07/2017)    Time 8   Period Weeks   Status New     PT LONG TERM GOAL #4   Title Pt will decrease falls risk with sit <> stand and turning as indicated by TUG score of <18   Baseline 25   Time 8   Period Weeks   Status New     PT LONG TERM GOAL #5   Title Patient will ambulate 500 feet outside over uneven terrain including ramps/curbs with SPC and will negotiate 3 stairs with SPC, 1 rail with supervision for community ambulation. (TARGET DATE: 04/07/2017)   Time 8   Period Weeks   Status New     PT LONG TERM GOAL #6   Title Pt will improve LLE strength to 4/5 overall at hip, knee and ankle   Baseline 3+ hip flexion, 4- knee extension, 4 knee flexion, 3- ankle DF   Time 8   Period Weeks   Status New               Plan - 02/23/17 1709    Clinical Impression Statement Pt continued to have L knee pain as indicated by OT yesterday; attempted to address pain with figure 8 wrapping and ice during treatment session.  Continued to assess pt's vertigo with pt continuing to report significant vertigo during R Hallpike but no nystagmus noted (unable to use frenzel's today).  Due to symptom presentation performed CRM for R posterior canal with improvement in symptoms.  Will continue to assess.  Continued to focus on weight shifting, single limb stance and foot clearance with foot taps forwards and laterally with each LE.  Required manual support for L knee.  Appointment is Tuesday for knee brace from [facility].   Rehab Potential Good   Clinical Impairments Affecting Rehab Potential pain of chronic nature   PT Treatment/Interventions ADLs/Self Care Home Management;Canalith Repostioning;Electrical Stimulation;DME Instruction;Gait training;Stair  training;Functional mobility training;Therapeutic activities;Therapeutic exercise;Balance training;Neuromuscular re-education;Cognitive remediation;Patient/family education;Orthotic Fit/Training;Energy conservation;Taping;Vestibular   PT Next Visit Plan re-asses L knee pain-L hip tends toward ER-stretch and strengthen hip IR.  re-assess R posterior canal for BPPV-treat if indicated.  Standing balance, weight shifting, SLS, foot clearance during gait   Consulted and Agree with Plan of Care Patient      Patient will benefit from skilled therapeutic intervention in order to improve the following deficits and impairments:  Abnormal gait, Decreased endurance, Decreased balance, Decreased cognition, Decreased mobility, Decreased strength, Difficulty walking, Dizziness, Impaired sensation, Pain  Visit Diagnosis: Muscle weakness (generalized)  Hemiplegia and hemiparesis following cerebral infarction affecting left non-dominant side (HCC)  Unsteadiness on feet  Other abnormalities of gait and mobility  Repeated falls  Dizziness and giddiness     Problem List Patient Active Problem List   Diagnosis Date Noted  . Primary osteoarthritis of left knee 02/09/2017  . SDH (subdural hematoma) ([place])   . Urinary frequency   . Type 2 diabetes mellitus with peripheral neuropathy (HCC)   . Cough   . Gait disturbance, post-stroke 10/20/2016  . Hemiparesis affecting left side as late effect of stroke ([place]) 10/20/2016  . Essential hypertension 10/19/2016  . Hyperlipidemia LDL goal <70 10/19/2016  . 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 Left hemiparesis ([place])   . Atrial fibrillation ([place])   . History of fall   . History of traumatic subdural hematoma   . Vascular headache   . Hypokalemia   . Leukocytosis   .  Acute blood loss anemia   . Hypoalbuminemia due to protein-calorie malnutrition ([place])   . Dysphagia, post-stroke   . Acute respiratory failure ([place])   . Acute embolic stroke (HCC) - R  putamen/caudate and R insular infarcts s/p TICI3 revascularization w/ mechanical thrombectomy d/t AF not on [facility] 10/15/2016  . Pressure injury of skin 10/15/2016  . HCAP (healthcare-associated pneumonia) 11/09/2014  . Weakness 08/25/2014  . Acute bronchitis 08/25/2014  . UTI (lower urinary tract infection) 08/25/2014  . Fracture of lumbar spine ([place]) 08/25/2014  . Subdural hematoma, post-traumatic ([place]) 05/12/2014  . Chronic atrial fibrillation ([place]) 05/12/2014  . Diabetes mellitus ([place]) 05/12/2014  . OSA on CPAP 05/12/2014   Raylene Everts, PT, DPT 02/23/17    5:16 PM    [facility] [address] [place], [place], [address] Phone: [phone]   Fax:  [phone]  Name: Laddie Naeem MRN: [id number] Date of Birth: 28-Mar-1941

## 2017-02-23 NOTE — Patient Instructions (Signed)
Habituation - Tip Card  1.The goal of habituation training is to assist in decreasing symptoms of vertigo, dizziness, or nausea provoked by specific head and body motions. 2.These exercises may initially increase symptoms; however, be persistent and work through symptoms. With repetition and time, the exercises will assist in reducing or eliminating symptoms. 3.Exercises should be stopped and discussed with the therapist if you experience any of the following: - Sudden change or fluctuation in hearing - New onset of ringing in the ears, or increase in current intensity - Any fluid discharge from the ear - Severe pain in neck or back - Extreme nausea  Habituation - Sit to Side-Lying   Sit on edge of bed. Lie down onto the right side and hold until dizziness stops, plus 20 seconds.  Return to sitting and wait until dizziness stops, plus 20 seconds.  Repeat to the left side. Repeat sequence 5 times per session. Do 2 sessions per day.  Copyright  VHI. All rights reserved.     

## 2017-02-24 ENCOUNTER — Ambulatory Visit: Payer: Medicare Other | Admitting: Rehabilitation

## 2017-02-24 ENCOUNTER — Encounter: Payer: Self-pay | Admitting: Rehabilitation

## 2017-02-24 DIAGNOSIS — M6281 Muscle weakness (generalized): Secondary | ICD-10-CM | POA: Diagnosis not present

## 2017-02-24 DIAGNOSIS — R2689 Other abnormalities of gait and mobility: Secondary | ICD-10-CM

## 2017-02-24 DIAGNOSIS — R2681 Unsteadiness on feet: Secondary | ICD-10-CM

## 2017-02-24 DIAGNOSIS — I69354 Hemiplegia and hemiparesis following cerebral infarction affecting left non-dominant side: Secondary | ICD-10-CM

## 2017-02-24 DIAGNOSIS — R296 Repeated falls: Secondary | ICD-10-CM

## 2017-02-24 NOTE — Patient Instructions (Signed)
Pelvic Tilt: Posterior - Legs Bent (Supine)    Tighten stomach and flatten back by rolling pelvis down. Hold __5__ seconds. Relax. Repeat _10___ times per set. Do _1___ sets per session. Do _1-2___ sessions per day.  http://orth.exer.us/203   Copyright  VHI. All rights reserved.   Bracing With Bridging (Hook-Lying)    With neutral spine, tighten pelvic floor and abdominals and hold. Lift bottom. Repeat _10_ times. Do _1-2__ times a day.  Do them slowly!   Copyright  VHI. All rights reserved.

## 2017-02-24 NOTE — Therapy (Signed)
[facility] [address] [place], [place], [address] Phone: [phone]   Fax:  [phone]  Physical Therapy Treatment  Patient Details  Name: [name] MRN: [id number] Date of Birth: 1940-10-30 Referring Provider: Alysia Penna, MD  Encounter Date: 02/24/2017      PT End of Session - 02/24/17 1111    Visit Number 4   Number of Visits 17   Date for PT Re-Evaluation 04/09/17   Authorization Type Medicare & G-codes with proress note every 10th visit    PT Start Time 0931   PT Stop Time 1015   PT Time Calculation (min) 44 min   Activity Tolerance Patient tolerated treatment well   Behavior During Therapy [facility] for tasks assessed/performed      Past Medical History:  Diagnosis Date  . Atrial fibrillation ([place])   . Chronic back pain    "mid back down into lower back" (08/25/2014)  . Frequent falls   . GERD (gastroesophageal reflux disease)   . Hypertriglyceridemia   . OSA on CPAP   . Osteoarthritis    "knees, hands, back" (08/25/2014)  . Pneumonia ~ 2000 X 1  . Subdural hematoma ([place]) july 2015   S/P fall while on Coumadin  . T12 compression fracture ([place]) 2012  . Type II diabetes mellitus ([place])     Past Surgical History:  Procedure Laterality Date  . APPENDECTOMY  2012  . CATARACT EXTRACTION W/ INTRAOCULAR LENS  IMPLANT, BILATERAL Bilateral 2000's  . IR GENERIC HISTORICAL  10/15/2016   IR PERCUTANEOUS ART THROMBECTOMY/INFUSION INTRACRANIAL INC DIAG ANGIO 10/15/2016 [name], MD [facility]  . IR GENERIC HISTORICAL  12/13/2016   IR RADIOLOGIST EVAL & MGMT 12/13/2016 [facility]  . RADIOLOGY WITH ANESTHESIA N/A 10/15/2016   Procedure: RADIOLOGY WITH ANESTHESIA;  Surgeon: [name], MD;  Location: [facility];  Service: Radiology;  Laterality: N/A;  . TOTAL ABDOMINAL HYSTERECTOMY  1990    There were no vitals filed for this visit.      Subjective Assessment - 02/24/17 0935    Subjective  Note that L knee is better today, however she has some increased back pain today    Patient is accompained by: Family member   Pertinent History falls with subdural hematoma, Afib, chronic back pain, T12 compression fx, type 2 DM, OA, PNA   Limitations Walking;House hold activities;Other (comment);Standing   Patient Stated Goals To treat her weak legs and walk free of AD   Currently in Pain? Yes   Pain Score 6    Pain Location Back   Pain Orientation Lower;Right   Pain Descriptors / Indicators Aching   Pain Type Chronic pain   Pain Onset More than a month ago   Pain Frequency Intermittent   Aggravating Factors  sitting    Pain Relieving Factors lying down and walking/standing up                Vestibular Assessment - 02/23/17 1347      Dix-Hallpike Right   Dix-Hallpike Right Duration 15 seconds vertigo, no nystagmus   Dix-Hallpike Right Symptoms No nystagmus     Dix-Hallpike Left   Dix-Hallpike Left Duration 5 seconds, mild vertigo   Dix-Hallpike Left Symptoms No nystagmus                 [facility] Adult PT Treatment/Exercise - 02/24/17 0944      Ambulation/Gait   Ambulation/Gait Yes   Ambulation/Gait Assistance 5: Supervision;4: Min guard   Ambulation/Gait Assistance  Details Following NMR tasks, assessed gait for carryover with improved stride length.  She requires min/guard with use of quad cane and cues for smoother stepping pattern (was stepping really large steps with RLE initially) and to continue larger stride length and for upward gaze.  Then assessed gait with RW to see if this would impact her ability to maintain higher gait speed, however she continued to have shuffled gait pattern with decreased L foot clearance.     Ambulation Distance (Feet) 115 Feet  x 2 reps   Assistive device --  quad cane and RW   Gait Pattern Step-to pattern;Decreased arm swing - left;Decreased step length - right;Decreased stance time - left;Decreased stride length;Decreased  dorsiflexion - left;Decreased weight shift to left;Shuffle;Trunk flexed   Ambulation Surface Level;Indoor   Gait Comments Provided brief education on possibility of foot up brace and the benefits that it could have, however did not want to over brace as she wil have somewhat bulky knee brace for L side.  Discussed that she would need to have lace up shoes and could use elastic shoe laces for ease of donning/doffing.  will discuss with primary PT.       Neuro Re-ed    Neuro Re-ed Details  At counter top but with little support as possible, had pt stand on red therapy mat and tap cones alternating LEs (transitioning from BUE support>single UE support>no UE support with min/guard) to emphasis SLS x 5 x 2 reps progressing to tipping cones over and back upright without UE support with min A .  No signs of LLE buckle, however provided cues for increasing LE activation.  Tolerated well.   Progressed to side stepping with large step over orange barriers x 4 (barriers) x 4 laps with tactile and verbal cues for larger steps over obstacle as she would tend to step over, not have enough room for outer foot and then take several small steps to next target.  Daughter assisted with tactile cues.      Exercises   Exercises Other Exercises   Other Exercises  supine posterior pelvic tilt x 10 reps, supine B LE bridging x 10 reps to address low back pain.  Tolerated well with mild improvement, provided for HEP when back pain is elevated at home.  Both verbalized understanding.  See pt instruction.          Vestibular Treatment/Exercise - 02/23/17 1347      Vestibular Treatment/Exercise   Vestibular Treatment Provided Canalith Repositioning   Canalith Repositioning Epley Manuever Right      EPLEY MANUEVER RIGHT   Number of Reps  2   Overall Response Improved Symptoms   Response Details  Decreased dizziness after second repetition            Balance Exercises - 02/23/17 1355      Balance Exercises:  Standing   Other Standing Exercises Performed alternating foot taps to 6" step initialy with bilat UE support transitioning to no UE support and then performed laterally to L and R with no UE support but with min A for weight shifting/balance x 10 reps each.  Required tactile cues for weight shifting and WB through LLE as well as manual support at L knee to prevent buckling.           PT Education - 02/24/17 1111    Education provided Yes   Education Details education on possible benefits of foot up brace   Person(s) Educated Patient;Child(ren)   Methods Explanation     Comprehension Verbalized understanding          PT Short Term Goals - 02/17/17 1232      PT SHORT TERM GOAL #1   Title (TARGET DATE: 03/10/2017) Pt will participate in stair negotiation assessment and vestibular evaluation with goal to be set if needed.   Baseline Need to further assess vestibular   Status Partially Met     PT SHORT TERM GOAL #2   Title Patient decrease falls risk as indicated by a gait velocity of > or = 1.31ft/s with SPC.  (TARGET DATE: 03/10/2017)    Baseline 1.28ft/sec with cane   Time 4   Period Weeks   Status New     PT SHORT TERM GOAL #3   Title Patient will demonstrate ability to ambulate 250 feet over level surfaces with SPC with 25% cues for increased foot clearance/step length and perform 180 deg turns to L and R with supervision overall. (TARGET DATE: 03/10/2017)    Time 4   Period Weeks   Status New     PT SHORT TERM GOAL #4   Title TUG will be performed and long term goal to be set. (TARGET DATE: 03/10/2017)    Baseline met 02/17/17   Time 4   Period Weeks   Status Achieved     PT SHORT TERM GOAL #5   Title Pt will improve balance and decrease falls risk as indicated by improvement in BERG score to >or = 37/56   Baseline 30/56   Time 4   Period Weeks   Status New           PT Long Term Goals - 02/17/17 1233      PT LONG TERM GOAL #1   Title Patient will demonstrate  independence with HEP and verbalize options for community fitness program. (TARGET DATE: 04/07/2017)   Time 8   Period Weeks   Status New     PT LONG TERM GOAL #2   Title Patient will improve gait velocity to >2.0 ft/sec with SPC to indicate pt is safe for limited community ambulation. (TARGET DATE: 04/07/2017)    Time 8   Period Weeks   Status New     PT LONG TERM GOAL #3   Title Patient will score > or = 42/56 on the Berg Balance Test to indicate decreased falls risk. (TARGET DATE: 04/07/2017)    Time 8   Period Weeks   Status New     PT LONG TERM GOAL #4   Title Pt will decrease falls risk with sit <> stand and turning as indicated by TUG score of <18   Baseline 25   Time 8   Period Weeks   Status New     PT LONG TERM GOAL #5   Title Patient will ambulate 500 feet outside over uneven terrain including ramps/curbs with SPC and will negotiate 3 stairs with SPC, 1 rail with supervision for community ambulation. (TARGET DATE: 04/07/2017)   Time 8   Period Weeks   Status New     PT LONG TERM GOAL #6   Title Pt will improve LLE strength to 4/5 overall at hip, knee and ankle   Baseline 3+ hip flexion, 4- knee extension, 4 knee flexion, 3- ankle DF   Time 8   Period Weeks   Status New               Plan - 02/24/17 1111    Clinical Impression Statement Pt with improved knee   pain today, but wanted to remove ace wrap.  Provided for daughter to use if necessary.  Note increased back pain, therefore provided two core exercises to reduce back pain when needed at home.  continue to focus on improved step length and SLS with NMR tasks followed by gait to carryover improved stride length.  Briefly discussed possibility of foot up brace, but will discuss with primary PT as she will already have knee brace.    Rehab Potential Good   Clinical Impairments Affecting Rehab Potential pain of chronic nature   PT Treatment/Interventions ADLs/Self Care Home Management;Canalith  Repostioning;Electrical Stimulation;DME Instruction;Gait training;Stair training;Functional mobility training;Therapeutic activities;Therapeutic exercise;Balance training;Neuromuscular re-education;Cognitive remediation;Patient/family education;Orthotic Fit/Training;Energy conservation;Taping;Vestibular   PT Next Visit Plan  Audra-I mentioned foot up brace, however could not assess due to her shoes-maybe we could try it with our tester tennis shoes (I can show you) to see and she could get elastic shoe laces? (I dont know it may be overkill) re-asses L knee pain-L hip tends toward ER-stretch and strengthen hip IR.  re-assess R posterior canal for BPPV-treat if indicated.  Standing balance, weight shifting, SLS, foot clearance during gait   Consulted and Agree with Plan of Care Patient      Patient will benefit from skilled therapeutic intervention in order to improve the following deficits and impairments:  Abnormal gait, Decreased endurance, Decreased balance, Decreased cognition, Decreased mobility, Decreased strength, Difficulty walking, Dizziness, Impaired sensation, Pain  Visit Diagnosis: Muscle weakness (generalized)  Hemiplegia and hemiparesis following cerebral infarction affecting left non-dominant side (HCC)  Unsteadiness on feet  Other abnormalities of gait and mobility  Repeated falls     Problem List Patient Active Problem List   Diagnosis Date Noted  . Primary osteoarthritis of left knee 02/09/2017  . SDH (subdural hematoma) (HCC)   . Urinary frequency   . Type 2 diabetes mellitus with peripheral neuropathy (HCC)   . Cough   . Gait disturbance, post-stroke 10/20/2016  . Hemiparesis affecting left side as late effect of stroke (HCC) 10/20/2016  . Essential hypertension 10/19/2016  . Hyperlipidemia LDL goal <70 10/19/2016  . 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  . Left hemiparesis (HCC)   . Atrial fibrillation (HCC)   . History of fall   . History of traumatic  subdural hematoma   . Vascular headache   . Hypokalemia   . Leukocytosis   . Acute blood loss anemia   . Hypoalbuminemia due to protein-calorie malnutrition (HCC)   . Dysphagia, post-stroke   . Acute respiratory failure (HCC)   . Acute embolic stroke (HCC) - R putamen/caudate and R insular infarcts s/p TICI3 revascularization w/ mechanical thrombectomy d/t AF not on AC 10/15/2016  . Pressure injury of skin 10/15/2016  . HCAP (healthcare-associated pneumonia) 11/09/2014  . Weakness 08/25/2014  . Acute bronchitis 08/25/2014  . UTI (lower urinary tract infection) 08/25/2014  . Fracture of lumbar spine (HCC) 08/25/2014  . Subdural hematoma, post-traumatic (HCC) 05/12/2014  . Chronic atrial fibrillation (HCC) 05/12/2014  . Diabetes mellitus (HCC) 05/12/2014  . OSA on CPAP 05/12/2014     , PT, MPT [facility] [address] [place], [place], [address] Phone: [phone]   Fax:  [phone] 02/24/17, 11:16 AM  Name: [name] MRN: [id number] Date of Birth: 04/08/1941   

## 2017-02-28 ENCOUNTER — Ambulatory Visit: Payer: Medicare Other | Admitting: Physical Therapy

## 2017-02-28 ENCOUNTER — Ambulatory Visit: Payer: Medicare Other | Admitting: Occupational Therapy

## 2017-02-28 ENCOUNTER — Encounter: Payer: Self-pay | Admitting: Physical Therapy

## 2017-02-28 DIAGNOSIS — R2689 Other abnormalities of gait and mobility: Secondary | ICD-10-CM

## 2017-02-28 DIAGNOSIS — I69354 Hemiplegia and hemiparesis following cerebral infarction affecting left non-dominant side: Secondary | ICD-10-CM

## 2017-02-28 DIAGNOSIS — R2681 Unsteadiness on feet: Secondary | ICD-10-CM

## 2017-02-28 DIAGNOSIS — R296 Repeated falls: Secondary | ICD-10-CM

## 2017-02-28 DIAGNOSIS — I69315 Cognitive social or emotional deficit following cerebral infarction: Secondary | ICD-10-CM

## 2017-02-28 DIAGNOSIS — M6281 Muscle weakness (generalized): Secondary | ICD-10-CM

## 2017-02-28 DIAGNOSIS — R42 Dizziness and giddiness: Secondary | ICD-10-CM

## 2017-02-28 NOTE — Therapy (Signed)
[facility] [address] [place], [place], [address] Phone: [phone]   Fax:  [phone]  Occupational Therapy Treatment  Patient Details  Name: [name] MRN: [id number] Date of Birth: 08-26-1941 Referring Provider: Dr. Wynn Banker  Encounter Date: 02/28/2017      OT End of Session - 02/28/17 1700    Visit Number 5   Number of Visits 17   Date for OT Re-Evaluation 04/08/17   Authorization Type Medicare/ Medicaid   Authorization Time Period 60 days   Authorization - Visit Number 5   Authorization - Number of Visits 10   OT Start Time 1541  2 uits -pt in [place]   OT Stop Time 1615   OT Time Calculation (min) 34 min   Activity Tolerance Patient tolerated treatment well   Behavior During Therapy [facility] for tasks assessed/performed      Past Medical History:  Diagnosis Date  . Atrial fibrillation (HCC)   . Chronic back pain    "mid back down into lower back" (08/25/2014)  . Frequent falls   . GERD (gastroesophageal reflux disease)   . Hypertriglyceridemia   . OSA on CPAP   . Osteoarthritis    "knees, hands, back" (08/25/2014)  . Pneumonia ~ 2000 X 1  . Subdural hematoma (HCC) july 2015   S/P fall while on Coumadin  . T12 compression fracture (HCC) 2012  . Type II diabetes mellitus (HCC)     Past Surgical History:  Procedure Laterality Date  . APPENDECTOMY  2012  . CATARACT EXTRACTION W/ INTRAOCULAR LENS  IMPLANT, BILATERAL Bilateral 2000's  . IR GENERIC HISTORICAL  10/15/2016   IR PERCUTANEOUS ART THROMBECTOMY/INFUSION INTRACRANIAL INC DIAG ANGIO 10/15/2016 [name], MD [facility]  . IR GENERIC HISTORICAL  12/13/2016   IR RADIOLOGIST EVAL & MGMT 12/13/2016 [facility]  . RADIOLOGY WITH ANESTHESIA N/A 10/15/2016   Procedure: RADIOLOGY WITH ANESTHESIA;  Surgeon: [name], MD;  [facility];  Service: Radiology;  Laterality: N/A;  . TOTAL ABDOMINAL HYSTERECTOMY  1990    There were no  vitals filed for this visit.      Subjective Assessment - 02/28/17 1658    Subjective  Pt's dtr reports she has to cue her for safety at home   Pertinent History R MCA CVA, hx of fall with subdural hematoma, A-fib, PNA   Patient Stated Goals return to cooking   Currently in Pain? No/denies           Treatment: simple cooking task in standing using walker with tray. Pt gathered items with frequent cueing for walker/ balance safety as pt kept letting go of walker of countertop and she reached for itmes that were far away. Pt was able to fry and egg, turn off stove and place egg on a plate safely. Pt required v.c. To avoid holding her hand over burner to see if it was hot, and to keep her hands away from hot burner after food was cooked. Pt's dtr was present and she communicated safety recommendations. Therapist recommends pt cooks only with direct supervision/ assistance, pt's dtr verbalizes understanding.. Pt wiped kitchen table in standing close supervision and mod v.c. For safety. Mid range functional reaching with LUE, in prep for ADLs, min v.c.                     OT Short Term Goals - 02/22/17 1645      OT SHORT TERM GOAL #1   Title Pt/  caregiver will be I with HEP. due 03/09/17   Time 4   Period Weeks   Status New     OT SHORT TERM GOAL #2   Title Pt will perform simple cooking/ home management in standing x 25 mins without rest break or LOB.   Baseline 10-15 mins   Time 4   Period Weeks   Status New     OT SHORT TERM GOAL #3   Title Pt/ caregiver will verbalize understanding of compensatory stategies for short term memory deficits.   Time 4   Period Weeks   Status New     OT SHORT TERM GOAL #4   Title Test standing functional reach and set goal prn   Time 4   Period Weeks   Status Deferred  due to knee pain and communication difficulties     OT SHORT TERM GOAL #5   Title Pt will demonstrate improved fine motor coordination for ADLS as evidenced  by decreasing LUE 9 hole peg test score to 29 secs or less.   Baseline RUE 24.50 secs LUE 32.75 secs   Time 4   Period Weeks   Status New           OT Long Term Goals - 02/08/17 1440      OT LONG TERM GOAL #1   Title Pt will perfom mod complex cooking/ home management in standing x 45 mins without rest break or LOB. due 04/08/17   Time 8   Period Weeks   Status New     OT LONG TERM GOAL #2   Title Pt will increase bilateral grip strength to 25 lbs or greater for increased functional use for ADLs.   Time 8   Period Weeks   Status New     OT LONG TERM GOAL #3   Title Pt will demonstrate ability to retrieve 2 lbs from overhead shelf at 125 without drops with RUE   Baseline 120   Time 8   Period Weeks   Status New     OT LONG TERM GOAL #4   Title Pt will demonstrate ability to retrieve 2 lbs item from ovehead shelf at 120 shoulder flexion with LUE   Baseline 110   Time 8   Period Weeks   Status New               Plan - 02/28/17 1659    Clinical Impression Statement Pt is progressing slowly towards goals. She demonstrates decreased safety awareness with home management in standing.   Rehab Potential Fair   OT Frequency 2x / week   OT Duration 8 weeks   OT Treatment/Interventions Self-care/ADL training;Moist Heat;Fluidtherapy;DME and/or AE instruction;Splinting;Patient/family education;Balance training;Therapeutic exercises;Ultrasound;Therapeutic exercise;Therapeutic activities;Cognitive remediation/compensation;Passive range of motion;Functional Mobility Training;Neuromuscular education;Cryotherapy;Electrical Stimulation;Parrafin;Energy conservation;Manual Therapy   Plan functional use of LUE, continue home management-consider loading/ unload washer/ dryer   Consulted and Agree with Plan of Care Patient   Family Member Consulted daughter      Patient will benefit from skilled therapeutic intervention in order to improve the following deficits and impairments:   Abnormal gait, Decreased cognition, Decreased knowledge of use of DME, Pain, Impaired sensation, Decreased mobility, Decreased coordination, Decreased activity tolerance, Decreased endurance, Decreased range of motion, Decreased strength, Impaired UE functional use, Impaired perceived functional ability, Difficulty walking, Decreased safety awareness, Decreased knowledge of precautions, Decreased balance  Visit Diagnosis: Muscle weakness (generalized)  Hemiplegia and hemiparesis following cerebral infarction affecting left non-dominant side (HCC)  Unsteadiness on feet  Other abnormalities of gait and mobility  Cognitive social or emotional deficit following cerebral infarction    Problem List Patient Active Problem List   Diagnosis Date Noted  . Primary osteoarthritis of left knee 02/09/2017  . SDH (subdural hematoma) (HCC)   . Urinary frequency   . Type 2 diabetes mellitus with peripheral neuropathy (HCC)   . Cough   . Gait disturbance, post-stroke 10/20/2016  . Hemiparesis affecting left side as late effect of stroke (HCC) 10/20/2016  . Essential hypertension 10/19/2016  . Hyperlipidemia LDL goal <70 10/19/2016  . 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  . Left hemiparesis (HCC)   . Atrial fibrillation (HCC)   . History of fall   . History of traumatic subdural hematoma   . Vascular headache   . Hypokalemia   . Leukocytosis   . Acute blood loss anemia   . Hypoalbuminemia due to protein-calorie malnutrition (HCC)   . Dysphagia, post-stroke   . Acute respiratory failure (HCC)   . Acute embolic stroke (HCC) - R putamen/caudate and R insular infarcts s/p TICI3 revascularization w/ mechanical thrombectomy d/t AF not [facility] 10/15/2016  . Pressure injury of skin 10/15/2016  . HCAP (healthcare-associated pneumonia) 11/09/2014  . Weakness 08/25/2014  . Acute bronchitis 08/25/2014  . UTI (lower urinary tract infection) 08/25/2014  . Fracture of lumbar spine (HCC) 08/25/2014  .  Subdural hematoma, post-traumatic (HCC) 05/12/2014  . Chronic atrial fibrillation (HCC) 05/12/2014  . Diabetes mellitus (HCC) 05/12/2014  . OSA on CPAP 05/12/2014    Maja Mccaffery 02/28/2017, 5:01 PM [place] BreathKathryn Lakya Schrupp, OTR/L [phone] Phone: [phone] 5:06 PM 02/28/17 [facility] [address] [place], [place], [address] Phone: [phone]   Fax:  [phone]  Name: Darlina RumpfHue [name] MRN: [id number] Date of Birth: 08/13/1941

## 2017-02-28 NOTE — Therapy (Signed)
[facility] [address] [place], [place], [address] Phone: [phone]   Fax:  [phone]  Physical Therapy Treatment  Patient Details  Name: [name] MRN: [id number] Date of Birth: 1941-05-14 Referring Provider: Alysia Penna, MD  Encounter Date: 02/28/2017      PT End of Session - 02/28/17 1653    Visit Number 5   Number of Visits 17   Date for PT Re-Evaluation 04/09/17   Authorization Type Medicare & G-codes with proress note every 10th visit    PT Start Time 1446   PT Stop Time 1530   PT Time Calculation (min) 44 min   Activity Tolerance Patient tolerated treatment well   Behavior During Therapy [facility] for tasks assessed/performed      Past Medical History:  Diagnosis Date  . Atrial fibrillation ([place])   . Chronic back pain    "mid back down into lower back" (08/25/2014)  . Frequent falls   . GERD (gastroesophageal reflux disease)   . Hypertriglyceridemia   . OSA on CPAP   . Osteoarthritis    "knees, hands, back" (08/25/2014)  . Pneumonia ~ 2000 X 1  . Subdural hematoma ([place]) july 2015   S/P fall while on Coumadin  . T12 compression fracture ([place]) 2012  . Type II diabetes mellitus ([place])     Past Surgical History:  Procedure Laterality Date  . APPENDECTOMY  2012  . CATARACT EXTRACTION W/ INTRAOCULAR LENS  IMPLANT, BILATERAL Bilateral 2000's  . IR GENERIC HISTORICAL  10/15/2016   IR PERCUTANEOUS ART THROMBECTOMY/INFUSION INTRACRANIAL INC DIAG ANGIO 10/15/2016 [name], MD [facility]  . IR GENERIC HISTORICAL  12/13/2016   IR RADIOLOGIST EVAL & MGMT 12/13/2016 [facility]  . RADIOLOGY WITH ANESTHESIA N/A 10/15/2016   Procedure: RADIOLOGY WITH ANESTHESIA;  Surgeon: [name], MD;  Location: [place];  Service: Radiology;  Laterality: N/A;  . TOTAL ABDOMINAL HYSTERECTOMY  1990    There were no vitals filed for this visit.      Subjective Assessment - 02/28/17 1457    Subjective  Reports that dizziness is improving at home; still having some L knee pain but it is improved.  Went to be measured for knee brace today.     Patient is accompained by: Family member   Pertinent History falls with subdural hematoma, Afib, chronic back pain, T12 compression fx, type 2 DM, OA, PNA   Limitations Walking;House hold activities;Other (comment);Standing   Patient Stated Goals To treat her weak legs and walk free of AD   Currently in Pain? No/denies                Vestibular Assessment - 02/28/17 1512      Positional Testing   Dix-Hallpike Dix-Hallpike Right;Dix-Hallpike Left     Dix-Hallpike Right   Dix-Hallpike Right Duration 2-3 seconds   Dix-Hallpike Right Symptoms No nystagmus  with Frenzel lenses     Dix-Hallpike Left   Dix-Hallpike Left Duration >30 seconds   Dix-Hallpike Left Symptoms Upbeat, right rotatory nystagmus  mild nystagmus seen with Frenzel lenses                 [facility] Adult PT Treatment/Exercise - 02/28/17 1648      Ambulation/Gait   Ambulation/Gait Yes   Ambulation/Gait Assistance 4: Min guard   Ambulation/Gait Assistance Details performed gait with ace wrap around L foot for DF assistance to determine if foot up brace would improve foot clearance, step length and gait speed/efficiency.  Pt  continues to present with bilat shuffling gait due to poor weight shift and difficulty maintaining balance during single limb stance; at this time it does not appear that a foot up brace would improve gait significantly   Ambulation Distance (Feet) 115 Feet   Assistive device Large base quad cane  foot up brace   Gait Pattern Step-to pattern;Decreased step length - right;Decreased step length - left;Decreased stride length;Decreased hip/knee flexion - right;Decreased hip/knee flexion - left;Decreased dorsiflexion - left;Decreased dorsiflexion - right;Decreased weight shift to right;Decreased weight shift to left;Shuffle;Decreased trunk rotation;Trunk  flexed;Poor foot clearance - left;Poor foot clearance - right   Ambulation Surface Level;Indoor   Gait Comments cane adjusted to proper height for patient to avoid shoulder elevation     Knee/Hip Exercises: Stretches   Other Knee/Hip Stretches Supine L hip ER stretch into IR with foot on mat (painful trying to bring knee to opposite shoulder) with hand on pelvis to prevent rotation off of mat     Knee/Hip Exercises: Supine   Other Supine Knee/Hip Exercises Isometric resisted LLE hip IR x 5 reps x 5 seconds with knee in flexion and foot on mat         Vestibular Treatment/Exercise - 02/28/17 1513      Vestibular Treatment/Exercise   Vestibular Treatment Provided Canalith Repositioning   Canalith Repositioning Epley Manuever Left      EPLEY MANUEVER LEFT   Number of Reps  1   Overall Response  Improved Symptoms               PT Education - 02/28/17 1652    Education provided Yes   Education Details supine LLE ER stretch and isometric IR strengthening; educated on Clinical biochemist) Educated Patient;Child(ren)   Methods Explanation;Demonstration   Comprehension Verbalized understanding;Returned demonstration          PT Short Term Goals - 02/17/17 1232      PT SHORT TERM GOAL #1   Title (TARGET DATE: 03/10/2017) Pt will participate in stair negotiation assessment and vestibular evaluation with goal to be set if needed.   Baseline Need to further assess vestibular   Status Partially Met     PT SHORT TERM GOAL #2   Title Patient decrease falls risk as indicated by a gait velocity of > or = 1.27f/s with SPC.  (TARGET DATE: 03/10/2017)    Baseline 1.241fsec with cane   Time 4   Period Weeks   Status New     PT SHORT TERM GOAL #3   Title Patient will demonstrate ability to ambulate 250 feet over level surfaces with SPC with 25% cues for increased foot clearance/step length and perform 180 deg turns to L and R with supervision overall. (TARGET DATE:  03/10/2017)    Time 4   Period Weeks   Status New     PT SHORT TERM GOAL #4   Title TUG will be performed and long term goal to be set. (TARGET DATE: 03/10/2017)    Baseline met 02/17/17   Time 4   Period Weeks   Status Achieved     PT SHORT TERM GOAL #5   Title Pt will improve balance and decrease falls risk as indicated by improvement in BERG score to >or = 37/56   Baseline 30/56   Time 4   Period Weeks   Status New           PT Long Term Goals - 02/17/17 1233  PT LONG TERM GOAL #1   Title Patient will demonstrate independence with HEP and verbalize options for community fitness program. (TARGET DATE: 04/07/2017)   Time 8   Period Weeks   Status New     PT LONG TERM GOAL #2   Title Patient will improve gait velocity to >2.0 ft/sec with SPC to indicate pt is safe for limited community ambulation. (TARGET DATE: 04/07/2017)    Time 8   Period Weeks   Status New     PT LONG TERM GOAL #3   Title Patient will score > or = 42/56 on the Berg Balance Test to indicate decreased falls risk. (TARGET DATE: 04/07/2017)    Time 8   Period Weeks   Status New     PT LONG TERM GOAL #4   Title Pt will decrease falls risk with sit <> stand and turning as indicated by TUG score of <18   Baseline 25   Time 8   Period Weeks   Status New     PT LONG TERM GOAL #5   Title Patient will ambulate 500 feet outside over uneven terrain including ramps/curbs with SPC and will negotiate 3 stairs with SPC, 1 rail with supervision for community ambulation. (TARGET DATE: 04/07/2017)   Time 8   Period Weeks   Status New     PT LONG TERM GOAL #6   Title Pt will improve LLE strength to 4/5 overall at hip, knee and ankle   Baseline 3+ hip flexion, 4- knee extension, 4 knee flexion, 3- ankle DF   Time 8   Period Weeks   Status New               Plan - 02/28/17 1653    Clinical Impression Statement Pt returns with improvements in dizziness and knee pain; minimal dizziness noted in  positional testing with frenzel lenses-pt treated one more time with CRM and then transitioned to gait assessment with simulated foot up brace to determine effect on gait.  No significant benefit to gait noted; will not pursue at this time.  Will continue to focus on weight shifting, single limb stance balance and LE strengthening.  Educated daughter on home stretches for LLE to place L hip and knee in better alignment to minimize strain on medial knee.  Will continue to address.   Rehab Potential Good   Clinical Impairments Affecting Rehab Potential pain of chronic nature   PT Treatment/Interventions ADLs/Self Care Home Management;Canalith Repostioning;Electrical Stimulation;DME Instruction;Gait training;Stair training;Functional mobility training;Therapeutic activities;Therapeutic exercise;Balance training;Neuromuscular re-education;Cognitive remediation;Patient/family education;Orthotic Fit/Training;Energy conservation;Taping;Vestibular   PT Next Visit Plan gait with L knee brace; address L knee tightness: L hip tends toward ER-stretch and strengthen hip IR.  Standing balance, weight shifting, SLS, foot clearance during gait    Consulted and Agree with Plan of Care Patient;Family member/caregiver   Family Member Consulted daughter      Patient will benefit from skilled therapeutic intervention in order to improve the following deficits and impairments:  Abnormal gait, Decreased endurance, Decreased balance, Decreased cognition, Decreased mobility, Decreased strength, Difficulty walking, Dizziness, Impaired sensation, Pain  Visit Diagnosis: Muscle weakness (generalized)  Unsteadiness on feet  Other abnormalities of gait and mobility  Hemiplegia and hemiparesis following cerebral infarction affecting left non-dominant side (HCC)  Repeated falls  Dizziness and giddiness     Problem List Patient Active Problem List   Diagnosis Date Noted  . Primary osteoarthritis of left knee 02/09/2017   . SDH (subdural hematoma) ([place])   .  Urinary frequency   . Type 2 diabetes mellitus with peripheral neuropathy (HCC)   . Cough   . Gait disturbance, post-stroke 10/20/2016  . Hemiparesis affecting left side as late effect of stroke ([place]) 10/20/2016  . Essential hypertension 10/19/2016  . Hyperlipidemia LDL goal <70 10/19/2016  . 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 Left hemiparesis ([place])   . Atrial fibrillation ([place])   . History of fall   . History of traumatic subdural hematoma   . Vascular headache   . Hypokalemia   . Leukocytosis   . Acute blood loss anemia   . Hypoalbuminemia due to protein-calorie malnutrition ([place])   . Dysphagia, post-stroke   . Acute respiratory failure ([place])   . Acute embolic stroke (HCC) - R putamen/caudate and R insular infarcts s/p TICI3 revascularization w/ mechanical thrombectomy d/t AF not on [facility] 10/15/2016  . Pressure injury of skin 10/15/2016  . HCAP (healthcare-associated pneumonia) 11/09/2014  . Weakness 08/25/2014  . Acute bronchitis 08/25/2014  . UTI (lower urinary tract infection) 08/25/2014  . Fracture of lumbar spine ([place]) 08/25/2014  . Subdural hematoma, post-traumatic ([place]) 05/12/2014  . Chronic atrial fibrillation ([place]) 05/12/2014  . Diabetes mellitus ([place]) 05/12/2014  . OSA on CPAP 05/12/2014    Raylene Everts, PT, DPT 02/28/17    4:59 PM    [facility] [address] [place], [place], [address] Phone: [phone]   Fax:  [phone]  Name: [name] MRN: [id number] Date of Birth: [birth year]

## 2017-03-03 ENCOUNTER — Encounter: Payer: Self-pay | Admitting: Physical Therapy

## 2017-03-03 ENCOUNTER — Ambulatory Visit: Payer: Medicare Other | Admitting: Occupational Therapy

## 2017-03-03 ENCOUNTER — Ambulatory Visit: Payer: Medicare Other | Admitting: Physical Therapy

## 2017-03-03 DIAGNOSIS — M6281 Muscle weakness (generalized): Secondary | ICD-10-CM

## 2017-03-03 DIAGNOSIS — R2681 Unsteadiness on feet: Secondary | ICD-10-CM

## 2017-03-03 DIAGNOSIS — R2689 Other abnormalities of gait and mobility: Secondary | ICD-10-CM

## 2017-03-03 DIAGNOSIS — I69354 Hemiplegia and hemiparesis following cerebral infarction affecting left non-dominant side: Secondary | ICD-10-CM

## 2017-03-03 DIAGNOSIS — R296 Repeated falls: Secondary | ICD-10-CM

## 2017-03-03 DIAGNOSIS — R42 Dizziness and giddiness: Secondary | ICD-10-CM

## 2017-03-03 DIAGNOSIS — I69315 Cognitive social or emotional deficit following cerebral infarction: Secondary | ICD-10-CM

## 2017-03-03 NOTE — Therapy (Signed)
[facility] [address] [place], [place], [address] Phone: [phone]   Fax:  [phone]  Occupational Therapy Treatment  Patient Details  Name: [name] MRN: [id number] Date of Birth: 12/10/1940 Referring Provider: Dr. Wynn BankerKirsteins  Encounter Date: 03/03/2017      OT End of Session - 03/03/17 1644    Visit Number 6   Number of Visits 17   Date for OT Re-Evaluation 04/08/17   Authorization Type Medicare/ Medicaid   Authorization Time Period 60 days   Authorization - Visit Number 6   Authorization - Number of Visits 10   OT Start Time 1450   OT Stop Time 1530   OT Time Calculation (min) 40 min   Activity Tolerance Patient tolerated treatment well   Behavior During Therapy [facility] for tasks assessed/performed      Past Medical History:  Diagnosis Date  . Atrial fibrillation (HCC)   . Chronic back pain    "mid back down into lower back" (08/25/2014)  . Frequent falls   . GERD (gastroesophageal reflux disease)   . Hypertriglyceridemia   . OSA on CPAP   . Osteoarthritis    "knees, hands, back" (08/25/2014)  . Pneumonia ~ 2000 X 1  . Subdural hematoma (HCC) july 2015   S/P fall while on Coumadin  . T12 compression fracture (HCC) 2012  . Type II diabetes mellitus (HCC)     Past Surgical History:  Procedure Laterality Date  . APPENDECTOMY  2012  . CATARACT EXTRACTION W/ INTRAOCULAR LENS  IMPLANT, BILATERAL Bilateral 2000's  . IR GENERIC HISTORICAL  10/15/2016   IR PERCUTANEOUS ART THROMBECTOMY/INFUSION INTRACRANIAL INC DIAG ANGIO 10/15/2016 Julieanne CottonSanjeev Deveshwar, MD [facility]  . IR GENERIC HISTORICAL  12/13/2016   IR RADIOLOGIST EVAL & MGMT 12/13/2016 [facility]  . RADIOLOGY WITH ANESTHESIA N/A 10/15/2016   Procedure: RADIOLOGY WITH ANESTHESIA;  Surgeon: Julieanne CottonSanjeev Deveshwar, MD;  [facility];  Service: Radiology;  Laterality: N/A;  . TOTAL ABDOMINAL HYSTERECTOMY  1990    There were no vitals filed for this  visit.      Subjective Assessment - 03/03/17 1454    Pertinent History R MCA CVA, hx of fall with subdural hematoma, A-fib, PNA   Patient Stated Goals return to cooking   Currently in Pain? Yes   Pain Score 4    Pain Location Knee   Pain Descriptors / Indicators Aching   Pain Type Chronic pain   Pain Onset More than a month ago   Pain Frequency Intermittent   Aggravating Factors  standing   Pain Relieving Factors lying down   Multiple Pain Sites No        Treatment: Copying small peg design on vertical surface with LUE for increased fine motor coordination and functional reach, min v.c to avoid compensation, mod v.c for design. Standing to perform functional reach with LUE to place graded clothes pins on vertical antennae, min v.c/ facilitation Seated stacking pennies, then manipulating in hand to place in coin bank, min v.c                        OT Short Term Goals - 03/03/17 1503      OT SHORT TERM GOAL #1   Title Pt/ caregiver will be I with HEP. due 03/09/17   Time 4   Period Weeks   Status New     OT SHORT TERM GOAL #2   Title Pt will perform simple cooking/  home management in standing x 25 mins without rest break or LOB.   Baseline 10-15 mins   Time 4   Period Weeks   Status New     OT SHORT TERM GOAL #3   Title Pt/ caregiver will verbalize understanding of compensatory stategies for short term memory deficits.   Time 4   Period Weeks   Status New     OT SHORT TERM GOAL #4   Title Test standing functional reach and set goal prn   Time 4   Period Weeks   Status Deferred  due to knee pain and communication difficulties     OT SHORT TERM GOAL #5   Title Pt will demonstrate improved fine motor coordination for ADLS as evidenced by decreasing LUE 9 hole peg test score to 29 secs or less.   Baseline RUE 24.50 secs LUE 32.75 secs   Time 4   Period Weeks   Status New           OT Long Term Goals - 02/08/17 1440      OT LONG TERM  GOAL #1   Title Pt will perfom mod complex cooking/ home management in standing x 45 mins without rest break or LOB. due 04/08/17   Time 8   Period Weeks   Status New     OT LONG TERM GOAL #2   Title Pt will increase bilateral grip strength to 25 lbs or greater for increased functional use for ADLs.   Time 8   Period Weeks   Status New     OT LONG TERM GOAL #3   Title Pt will demonstrate ability to retrieve 2 lbs from overhead shelf at 125 without drops with RUE   Baseline 120   Time 8   Period Weeks   Status New     OT LONG TERM GOAL #4   Title Pt will demonstrate ability to retrieve 2 lbs item from ovehead shelf at 120 shoulder flexion with LUE   Baseline 110   Time 8   Period Weeks   Status New               Plan - 03/03/17 1455    Clinical Impression Statement Pt is progressing towards goals. Pt's daughter reports that pt has been helping in [place] more but she still needs cues for safety.   Rehab Potential Fair   OT Frequency 2x / week   OT Duration 8 weeks   OT Treatment/Interventions Self-care/ADL training;Moist Heat;Fluidtherapy;DME and/or AE instruction;Splinting;Patient/family education;Balance training;Therapeutic exercises;Ultrasound;Therapeutic exercise;Therapeutic activities;Cognitive remediation/compensation;Passive range of motion;Functional Mobility Training;Neuromuscular education;Cryotherapy;Electrical Stimulation;Parrafin;Energy conservation;Manual Therapy   Plan continue to address functional use of LUE,  home management activities, check short term goals next week   Consulted and Agree with Plan of Care Patient      Patient will benefit from skilled therapeutic intervention in order to improve the following deficits and impairments:  Abnormal gait, Decreased cognition, Decreased knowledge of use of DME, Pain, Impaired sensation, Decreased mobility, Decreased coordination, Decreased activity tolerance, Decreased endurance, Decreased range of motion,  Decreased strength, Impaired UE functional use, Impaired perceived functional ability, Difficulty walking, Decreased safety awareness, Decreased knowledge of precautions, Decreased balance  Visit Diagnosis: Hemiplegia and hemiparesis following cerebral infarction affecting left non-dominant side (HCC)  Unsteadiness on feet  Other abnormalities of gait and mobility  Cognitive social or emotional deficit following cerebral infarction  Muscle weakness (generalized)    Problem List Patient Active Problem List   Diagnosis Date Noted  .  Primary osteoarthritis of left knee 02/09/2017  . SDH (subdural hematoma) (HCC)   . Urinary frequency   . Type 2 diabetes mellitus with peripheral neuropathy (HCC)   . Cough   . Gait disturbance, post-stroke 10/20/2016  . Hemiparesis affecting left side as late effect of stroke (HCC) 10/20/2016  . Essential hypertension 10/19/2016  . Hyperlipidemia LDL goal <70 10/19/2016  . 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  . Left hemiparesis (HCC)   . Atrial fibrillation (HCC)   . History of fall   . History of traumatic subdural hematoma   . Vascular headache   . Hypokalemia   . Leukocytosis   . Acute blood loss anemia   . Hypoalbuminemia due to protein-calorie malnutrition (HCC)   . Dysphagia, post-stroke   . Acute respiratory failure (HCC)   . Acute embolic stroke (HCC) - R putamen/caudate and R insular infarcts s/p TICI3 revascularization w/ mechanical thrombectomy d/t AF not on [facility] 10/15/2016  . Pressure injury of skin 10/15/2016  . HCAP (healthcare-associated pneumonia) 11/09/2014  . Weakness 08/25/2014  . Acute bronchitis 08/25/2014  . UTI (lower urinary tract infection) 08/25/2014  . Fracture of lumbar spine (HCC) 08/25/2014  . Subdural hematoma, post-traumatic (HCC) 05/12/2014  . Chronic atrial fibrillation (HCC) 05/12/2014  . Diabetes mellitus (HCC) 05/12/2014  . OSA on CPAP 05/12/2014    RINE,KATHRYN 03/03/2017, 4:53 PM  [facility] [address] [place], [place], [address] Phone: [phone]   Fax:  [phone]  Name: [name] MRN: [id number] Date of Birth: 01/15/1941

## 2017-03-04 NOTE — Therapy (Signed)
[facility] [address] [place], [place], [address] Phone: [phone]   Fax:  [phone]  Physical Therapy Treatment  Patient Details  Name: [name] MRN: [id number] Date of Birth: Mar 16, 1941 Referring Provider: Alysia Penna, MD  Encounter Date: 03/03/2017      PT End of Session - 03/04/17 0957    Visit Number 6   Number of Visits 17   Date for PT Re-Evaluation 04/09/17   Authorization Type Medicare & G-codes with proress note every 10th visit    PT Start Time 1546   PT Stop Time 1630   PT Time Calculation (min) 44 min   Activity Tolerance Patient tolerated treatment well   Behavior During Therapy [facility] for tasks assessed/performed      Past Medical History:  Diagnosis Date  . Atrial fibrillation ([place])   . Chronic back pain    "mid back down into lower back" (08/25/2014)  . Frequent falls   . GERD (gastroesophageal reflux disease)   . Hypertriglyceridemia   . OSA on CPAP   . Osteoarthritis    "knees, hands, back" (08/25/2014)  . Pneumonia ~ 2000 X 1  . Subdural hematoma ([place]) july 2015   S/P fall while on Coumadin  . T12 compression fracture ([place]) 2012  . Type II diabetes mellitus ([place])     Past Surgical History:  Procedure Laterality Date  . APPENDECTOMY  2012  . CATARACT EXTRACTION W/ INTRAOCULAR LENS  IMPLANT, BILATERAL Bilateral 2000's  . IR GENERIC HISTORICAL  10/15/2016   IR PERCUTANEOUS ART THROMBECTOMY/INFUSION INTRACRANIAL INC DIAG ANGIO 10/15/2016 [name], MD [facility]INTER[facility] RAD  . IR GENERIC HISTORICAL  12/13/2016   IR RADIOLOGIST EVAL & MGMT 12/13/2016 [facility]  . RADIOLOGY WITH ANESTHESIA N/A 10/15/2016   Procedure: RADIOLOGY WITH ANESTHESIA;  Surgeon: [name], MD;  Location: [facility];  Service: Radiology;  Laterality: N/A;  . TOTAL ABDOMINAL HYSTERECTOMY  1990    There were no vitals filed for this visit.      Subjective Assessment - 03/03/17 1549    Subjective  Pt reports dizziness continues to improve; L knee pain is still present but will be getting knee brace next week.   Patient is accompained by: Family member   Pertinent History falls with subdural hematoma, Afib, chronic back pain, T12 compression fx, type 2 DM, OA, PNA   Limitations Walking;House hold activities;Other (comment);Standing   Patient Stated Goals To treat her weak legs and walk free of AD   Currently in Pain? Yes                         Mobile Adult PT Treatment/Exercise - 03/03/17 1606      Ambulation/Gait   Ambulation/Gait Yes   Ambulation/Gait Assistance 4: Min guard   Ambulation/Gait Assistance Details verbal, tactile and visual cues for full weight shifting, stance time and full step/stride length, foot clearance and heel strike.  Pt continues to have increased difficulty with L lateral weight shift due to weakness and pain in knee.     Ambulation Distance (Feet) 150 Feet   Assistive device Large base quad cane   Ambulation Surface Level;Indoor   Pre-Gait Activities Performed 4 square stepping in various directions, forwards, retro, R and L laterally clockwise and counter clockwise x 8 reps each + R and L diagonals to better facilitate stepping pattern for gait x 8 reps each with min-mod A and verbal/visual cues for full lateral and anterior weight  shift, full step length, foot clearance and heel strike.     Knee/Hip Exercises: Stretches   Other Knee/Hip Stretches Supine L hip ER stretch into IR with foot on mat x 3 reps x 30 seconds with hand on pelvis to prevent rotation off of mat     Knee/Hip Exercises: Supine   Hip Adduction Isometric Strengthening;Both;10 reps  ball between knees   Bridges Limitations Bridges with isometric hip ABD against belt x 5 reps   Bridges with [employer] Strengthening;Both;5 reps   Single Leg Bridge Strengthening;Left;5 reps  bridge with RLE step out/in   Other Supine Knee/Hip Exercises Isometric resisted LLE hip IR x 5  reps x 5 seconds with knee in flexion and foot on mat                PT Education - 03/04/17 0956    Education provided Yes   Education Details supine stretches, exercises, gait   Person(s) Educated Patient;Child(ren)   Methods Explanation;Demonstration   Comprehension Need further instruction          PT Short Term Goals - 02/17/17 1232      PT SHORT TERM GOAL #1   Title (TARGET DATE: 03/10/2017) Pt will participate in stair negotiation assessment and vestibular evaluation with goal to be set if needed.   Baseline Need to further assess vestibular   Status Partially Met     PT SHORT TERM GOAL #2   Title Patient decrease falls risk as indicated by a gait velocity of > or = 1.40f/s with SPC.  (TARGET DATE: 03/10/2017)    Baseline 1.264fsec with cane   Time 4   Period Weeks   Status New     PT SHORT TERM GOAL #3   Title Patient will demonstrate ability to ambulate 250 feet over level surfaces with SPC with 25% cues for increased foot clearance/step length and perform 180 deg turns to L and R with supervision overall. (TARGET DATE: 03/10/2017)    Time 4   Period Weeks   Status New     PT SHORT TERM GOAL #4   Title TUG will be performed and long term goal to be set. (TARGET DATE: 03/10/2017)    Baseline met 02/17/17   Time 4   Period Weeks   Status Achieved     PT SHORT TERM GOAL #5   Title Pt will improve balance and decrease falls risk as indicated by improvement in BERG score to >or = 37/56   Baseline 30/56   Time 4   Period Weeks   Status New           PT Long Term Goals - 02/17/17 1233      PT LONG TERM GOAL #1   Title Patient will demonstrate independence with HEP and verbalize options for community fitness program. (TARGET DATE: 04/07/2017)   Time 8   Period Weeks   Status New     PT LONG TERM GOAL #2   Title Patient will improve gait velocity to >2.0 ft/sec with SPC to indicate pt is safe for limited community ambulation. (TARGET DATE: 04/07/2017)     Time 8   Period Weeks   Status New     PT LONG TERM GOAL #3   Title Patient will score > or = 42/56 on the Berg Balance Test to indicate decreased falls risk. (TARGET DATE: 04/07/2017)    Time 8   Period Weeks   Status New     PT LONG TERM  GOAL #4   Title Pt will decrease falls risk with sit <> stand and turning as indicated by TUG score of <18   Baseline 25   Time 8   Period Weeks   Status New     PT LONG TERM GOAL #5   Title Patient will ambulate 500 feet outside over uneven terrain including ramps/curbs with SPC and will negotiate 3 stairs with SPC, 1 rail with supervision for community ambulation. (TARGET DATE: 04/07/2017)   Time 8   Period Weeks   Status New     PT LONG TERM GOAL #6   Title Pt will improve LLE strength to 4/5 overall at hip, knee and ankle   Baseline 3+ hip flexion, 4- knee extension, 4 knee flexion, 3- ankle DF   Time 8   Period Weeks   Status New               Plan - 03/04/17 0957    Clinical Impression Statement Pt with improvement in pain and dizziness over past couple of treatments.  Continued focus on LE ROM and strengthening to assist with pain, stability and proper positioning during transfers and gait.  Performed pre-gait stepping and weight shift training and gait training for carry over of gait sequence.  Pt tolerated well with improvement in knee pain at end of session.  Will continue to address.   Rehab Potential Good   Clinical Impairments Affecting Rehab Potential pain of chronic nature   PT Treatment/Interventions ADLs/Self Care Home Management;Canalith Repostioning;Electrical Stimulation;DME Instruction;Gait training;Stair training;Functional mobility training;Therapeutic activities;Therapeutic exercise;Balance training;Neuromuscular re-education;Cognitive remediation;Patient/family education;Orthotic Fit/Training;Energy conservation;Taping;Vestibular   PT Next Visit Plan CHECK STG BY 5/25.  gait with L knee brace; address L knee  tightness: L hip tends toward ER-stretch and strengthen hip IR.  Standing balance, weight shifting, SLS, foot clearance during gait    Consulted and Agree with Plan of Care Patient;Family member/caregiver   Family Member Consulted daughter      Patient will benefit from skilled therapeutic intervention in order to improve the following deficits and impairments:  Abnormal gait, Decreased endurance, Decreased balance, Decreased cognition, Decreased mobility, Decreased strength, Difficulty walking, Dizziness, Impaired sensation, Pain  Visit Diagnosis: Muscle weakness (generalized)  Unsteadiness on feet  Other abnormalities of gait and mobility  Hemiplegia and hemiparesis following cerebral infarction affecting left non-dominant side (HCC)  Repeated falls  Dizziness and giddiness     Problem List Patient Active Problem List   Diagnosis Date Noted  . Primary osteoarthritis of left knee 02/09/2017  . SDH (subdural hematoma) ([place])   . Urinary frequency   . Type 2 diabetes mellitus with peripheral neuropathy (HCC)   . Cough   . Gait disturbance, post-stroke 10/20/2016  . Hemiparesis affecting left side as late effect of stroke ([place]) 10/20/2016  . Essential hypertension 10/19/2016  . Hyperlipidemia LDL goal <70 10/19/2016  . 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 Left hemiparesis ([place])   . Atrial fibrillation ([place])   . History of fall   . History of traumatic subdural hematoma   . Vascular headache   . Hypokalemia   . Leukocytosis   . Acute blood loss anemia   . Hypoalbuminemia due to protein-calorie malnutrition ([place])   . Dysphagia, post-stroke   . Acute respiratory failure ([place])   . Acute embolic stroke (HCC) - R putamen/caudate and R insular infarcts s/p TICI3 revascularization w/ mechanical thrombectomy d/t AF not on [facility] 10/15/2016  . Pressure injury of skin 10/15/2016  . HCAP (healthcare-associated pneumonia)  11/09/2014  . Weakness 08/25/2014  . Acute bronchitis  08/25/2014  . UTI (lower urinary tract infection) 08/25/2014  . Fracture of lumbar spine ([place]) 08/25/2014  . Subdural hematoma, post-traumatic ([place]) 05/12/2014  . Chronic atrial fibrillation ([place]) 05/12/2014  . Diabetes mellitus ([place]) 05/12/2014  . OSA on CPAP 05/12/2014    Raylene Everts, PT, DPT 03/04/17    10:01 AM    [facility] [address] [place], [place], [address] Phone: [phone]   Fax:  [phone]  Name: [name] MRN: [id number] Date of Birth: 1941-05-25

## 2017-03-07 ENCOUNTER — Ambulatory Visit: Payer: Medicare Other | Admitting: Occupational Therapy

## 2017-03-07 DIAGNOSIS — M6281 Muscle weakness (generalized): Secondary | ICD-10-CM | POA: Diagnosis not present

## 2017-03-07 DIAGNOSIS — I69354 Hemiplegia and hemiparesis following cerebral infarction affecting left non-dominant side: Secondary | ICD-10-CM

## 2017-03-07 DIAGNOSIS — I69315 Cognitive social or emotional deficit following cerebral infarction: Secondary | ICD-10-CM

## 2017-03-07 NOTE — Patient Instructions (Signed)

## 2017-03-08 NOTE — Therapy (Signed)
[facility] [address] [place], [place], [address] Phone: [phone]   Fax:  [phone]  Occupational Therapy Treatment  Patient Details  Name: Lauren RumpfHue Minus MRN: [id number] Date of Birth: 07/09/1941 Referring Provider: Dr. Wynn BankerKirsteins  Encounter Date: 03/07/2017      OT End of Session - 03/08/17 1840    Visit Number 7   Number of Visits 17   Date for OT Re-Evaluation 04/08/17   Authorization Type Medicare/ Medicaid   Authorization Time Period 60 days   Authorization - Visit Number 7   Authorization - Number of Visits 10   OT Start Time 1535  2 units pt in BR   OT Stop Time 1620   OT Time Calculation (min) 45 min   Activity Tolerance Patient tolerated treatment well   Behavior [facility] for tasks assessed/performed      Past Medical History:  Diagnosis Date  . Atrial fibrillation (HCC)   . Chronic back pain    "mid back down into lower back" (08/25/2014)  . Frequent falls   . GERD (gastroesophageal reflux disease)   . Hypertriglyceridemia   . OSA on CPAP   . Osteoarthritis    "knees, hands, back" (08/25/2014)  . Pneumonia ~ 2000 X 1  . Subdural hematoma (HCC) july 2015   S/P fall while on Coumadin  . T12 compression fracture (HCC) 2012  . Type II diabetes mellitus (HCC)     Past Surgical History:  Procedure Laterality Date  . APPENDECTOMY  2012  . CATARACT EXTRACTION W/ INTRAOCULAR LENS  IMPLANT, BILATERAL Bilateral 2000's  . IR GENERIC HISTORICAL  10/15/2016   IR PERCUTANEOUS ART THROMBECTOMY/INFUSION INTRACRANIAL INC DIAG ANGIO 10/15/2016 Lauren CottonSanjeev Deveshwar, MD [facility]INTER[facility]  . IR GENERIC HISTORICAL  12/13/2016   IR RADIOLOGIST EVAL & MGMT 12/13/2016 [facility]  . RADIOLOGY WITH ANESTHESIA N/A 10/15/2016   Procedure: RADIOLOGY WITH ANESTHESIA;  Surgeon: Lauren CottonSanjeev Deveshwar, MD;  [facility];  Service: Radiology;  Laterality: N/A;  . TOTAL ABDOMINAL HYSTERECTOMY  1990    There were no  vitals filed for this visit.      Subjective Assessment - 03/07/17 1538    Pertinent History R MCA CVA, hx of fall with subdural hematoma, A-fib, PNA   Patient Stated Goals return to cooking   Currently in Pain? No/denies             Therapist started checking short term goals, see progress.  Pt performing functional activities in standing for increased standing tolerance, placing large pegs in pegboard, folding laundry and placing graded clothespins on vertical antennae close supervision without LOB.                   OT Short Term Goals - 03/07/17 1542      OT SHORT TERM GOAL #1   Title Pt/ caregiver will be I with HEP. due 03/09/17   Time 4   Period Weeks   Status Achieved     OT SHORT TERM GOAL #2   Title Pt will perform simple cooking/ home management in standing x 25 mins without rest break or LOB.   Time 4   Period Weeks   Status On-going     OT SHORT TERM GOAL #3   Title Pt/ caregiver will verbalize understanding of compensatory stategies for short term memory deficits.   Time 4   Period Weeks     OT SHORT TERM GOAL #4   Title Test standing functional reach  and set goal prn   Status Deferred     OT SHORT TERM GOAL #5   Title Pt will demonstrate improved fine motor coordination for ADLS as evidenced by decreasing LUE 9 hole peg test score to 29 secs or less.   Baseline RUE 24.50 secs LUE 32.75 secs   Time 4   Period Weeks   Status On-going  36.18 secs           OT Long Term Goals - 02/08/17 1440      OT LONG TERM GOAL #1   Title Pt will perfom mod complex cooking/ home management in standing x 45 mins without rest break or LOB. due 04/08/17   Time 8   Period Weeks   Status New     OT LONG TERM GOAL #2   Title Pt will increase bilateral grip strength to 25 lbs or greater for increased functional use for ADLs.   Time 8   Period Weeks   Status New     OT LONG TERM GOAL #3   Title Pt will demonstrate ability to retrieve 2 lbs  from overhead shelf at 125 without drops with RUE   Baseline 120   Time 8   Period Weeks   Status New     OT LONG TERM GOAL #4   Title Pt will demonstrate ability to retrieve 2 lbs item from ovehead shelf at 120 shoulder flexion with LUE   Baseline 110   Time 8   Period Weeks   Status New               Plan - 03/08/17 1839    Clinical Impression Statement Pt is progressing towards goals. She demonstrates improving UE functional use, and standing tolerance.   Rehab Potential Fair   OT Frequency 2x / week   OT Duration 8 weeks   OT Treatment/Interventions Self-care/ADL training;Moist Heat;Fluidtherapy;DME and/or AE instruction;Splinting;Patient/family education;Balance training;Therapeutic exercises;Ultrasound;Therapeutic exercise;Therapeutic activities;Cognitive remediation/compensation;Passive range of motion;Functional Mobility Training;Neuromuscular education;Cryotherapy;Electrical Stimulation;Parrafin;Energy conservation;Manual Therapy   Plan home management tasks in standing, functional reach   Consulted and Agree with Plan of Care Patient;Family member/caregiver   Family Member Consulted daughter      Patient will benefit from skilled therapeutic intervention in order to improve the following deficits and impairments:  Abnormal gait, Decreased cognition, Decreased knowledge of use of DME, Pain, Impaired sensation, Decreased mobility, Decreased coordination, Decreased activity tolerance, Decreased endurance, Decreased range of motion, Decreased strength, Impaired UE functional use, Impaired perceived functional ability, Difficulty walking, Decreased safety awareness, Decreased knowledge of precautions, Decreased balance  Visit Diagnosis: Hemiplegia and hemiparesis following cerebral infarction affecting left non-dominant side (HCC)  Cognitive social or emotional deficit following cerebral infarction  Muscle weakness (generalized)    Problem List Patient Active  Problem List   Diagnosis Date Noted  . Primary osteoarthritis of left knee 02/09/2017  . SDH (subdural hematoma) (HCC)   . Urinary frequency   . Type 2 diabetes mellitus with peripheral neuropathy (HCC)   . Cough   . Gait disturbance, post-stroke 10/20/2016  . Hemiparesis affecting left side as late effect of stroke (HCC) 10/20/2016  . Essential hypertension 10/19/2016  . Hyperlipidemia LDL goal <70 10/19/2016  . 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  . Left hemiparesis (HCC)   . Atrial fibrillation (HCC)   . History of fall   . History of traumatic subdural hematoma   . Vascular headache   . Hypokalemia   . Leukocytosis   . Acute blood loss anemia   .  Hypoalbuminemia due to protein-calorie malnutrition (HCC)   . Dysphagia, post-stroke   . Acute respiratory failure (HCC)   . Acute embolic stroke (HCC) - R putamen/caudate and R insular infarcts s/p TICI3 revascularization w/ mechanical thrombectomy d/t AF not on [facility] 10/15/2016  . Pressure injury of skin 10/15/2016  . HCAP (healthcare-associated pneumonia) 11/09/2014  . Weakness 08/25/2014  . Acute bronchitis 08/25/2014  . UTI (lower urinary tract infection) 08/25/2014  . Fracture of lumbar spine (HCC) 08/25/2014  . Subdural hematoma, post-traumatic (HCC) 05/12/2014  . Chronic atrial fibrillation (HCC) 05/12/2014  . Diabetes mellitus (HCC) 05/12/2014  . OSA on CPAP 05/12/2014    Jaquavion Mccannon 03/08/2017, 6:41 PM  [facility] [address] [place], [place], [address] Phone: [phone]   Fax:  [phone]  Name: Bethzy Hauck MRN: [id number] Date of Birth: 09-22-1941

## 2017-03-09 ENCOUNTER — Encounter: Payer: Self-pay | Admitting: Physical Therapy

## 2017-03-09 ENCOUNTER — Ambulatory Visit: Payer: Medicare Other | Admitting: Occupational Therapy

## 2017-03-09 ENCOUNTER — Ambulatory Visit: Payer: Medicare Other | Admitting: Physical Therapy

## 2017-03-09 DIAGNOSIS — I69354 Hemiplegia and hemiparesis following cerebral infarction affecting left non-dominant side: Secondary | ICD-10-CM

## 2017-03-09 DIAGNOSIS — I69315 Cognitive social or emotional deficit following cerebral infarction: Secondary | ICD-10-CM

## 2017-03-09 DIAGNOSIS — R296 Repeated falls: Secondary | ICD-10-CM

## 2017-03-09 DIAGNOSIS — R2681 Unsteadiness on feet: Secondary | ICD-10-CM

## 2017-03-09 DIAGNOSIS — M6281 Muscle weakness (generalized): Secondary | ICD-10-CM

## 2017-03-09 DIAGNOSIS — R2689 Other abnormalities of gait and mobility: Secondary | ICD-10-CM

## 2017-03-09 DIAGNOSIS — R42 Dizziness and giddiness: Secondary | ICD-10-CM

## 2017-03-09 NOTE — Therapy (Signed)
[facility] [address] [place], [place], [address] Phone: [phone]   Fax:  [phone]  Occupational Therapy Treatment  Patient Details  Name: Lauren RumpfHue Borkenhagen MRN: [id number] Date of Birth: 01/07/1941 Referring Provider: Dr. Wynn BankerKirsteins  Encounter Date: 03/09/2017      OT End of Session - 03/09/17 1636    Visit Number 8   Number of Visits 17   Date for OT Re-Evaluation 04/08/17   Authorization Type Medicare/ Medicaid   Authorization Time Period 60 days   Authorization - Visit Number 8   Authorization - Number of Visits 10   OT Start Time 1400   OT Stop Time 1445   OT Time Calculation (min) 45 min   Activity Tolerance Patient tolerated treatment well      Past Medical History:  Diagnosis Date  . Atrial fibrillation (HCC)   . Chronic back pain    "mid back down into lower back" (08/25/2014)  . Frequent falls   . GERD (gastroesophageal reflux disease)   . Hypertriglyceridemia   . OSA on CPAP   . Osteoarthritis    "knees, hands, back" (08/25/2014)  . Pneumonia ~ 2000 X 1  . Subdural hematoma (HCC) july 2015   S/P fall while on Coumadin  . T12 compression fracture (HCC) 2012  . Type II diabetes mellitus (HCC)     Past Surgical History:  Procedure Laterality Date  . APPENDECTOMY  2012  . CATARACT EXTRACTION W/ INTRAOCULAR LENS  IMPLANT, BILATERAL Bilateral 2000's  . IR GENERIC HISTORICAL  10/15/2016   IR PERCUTANEOUS ART THROMBECTOMY/INFUSION INTRACRANIAL INC DIAG ANGIO 10/15/2016 Julieanne CottonSanjeev Deveshwar, MD [facility]  . IR GENERIC HISTORICAL  12/13/2016   IR RADIOLOGIST EVAL & MGMT 12/13/2016 [facility]  . RADIOLOGY WITH ANESTHESIA N/A 10/15/2016   Procedure: RADIOLOGY WITH ANESTHESIA;  Surgeon: Julieanne CottonSanjeev Deveshwar, MD;  Location: [facility];  Service: Radiology;  Laterality: N/A;  . TOTAL ABDOMINAL HYSTERECTOMY  1990    There were no vitals filed for this visit.      Subjective Assessment - 03/09/17 1406    Subjective  Pt's dtr reports she has to cue her for safety at home, and needs re-inforcement   Patient is accompained by: Family member  daughter   Pertinent History R MCA CVA, hx of fall with subdural hematoma, A-fib, PNA   Patient Stated Goals return to cooking   Currently in Pain? Yes   Pain Score 4    Pain Location Knee  O.T. not directly addressed   Pain Orientation Left                      OT Treatments/Exercises (OP) - 03/09/17 0001      ADLs   Functional Mobility Practiced standing tolerance and safety with functional mobility in kitchen and negotiating through tight spaces using walker (since pt uses walker in kitchen). Practiced getting things in/out of low and high cabinets with cues for safe walker placement and balance including one hand on countertop or stable surface. Also practiced reaching outside BOS to pick up cones from chair to/from floor level bilaterally. Practiced placing laundry in washer and move to dryer and getting back out. Pt stood for 35 minutes w/o rest and no LOB, however, did feel close sup to CGA required d/t pt's decr. safety. Therapist also used gait belt   ADL Comments **Pt noted to drag cane in RUE behind her at times when walking into clinic. Discussed with daughter decreased body  awareness and attention as often seen in Rt CVA's.                   OT Short Term Goals - 03/09/17 1409      OT SHORT TERM GOAL #1   Title Pt/ caregiver will be I with HEP. due 03/09/17   Time 4   Period Weeks   Status Achieved     OT SHORT TERM GOAL #2   Title Pt will perform simple cooking/ home management in standing x 25 mins without rest break or LOB.   Time 4   Period Weeks   Status On-going     OT SHORT TERM GOAL #3   Title Pt/ caregiver will verbalize understanding of compensatory stategies for short term memory deficits.   Time 4   Period Weeks   Status On-going     OT SHORT TERM GOAL #4   Title Test standing functional reach  and set goal prn   Status Deferred     OT SHORT TERM GOAL #5   Title Pt will demonstrate improved fine motor coordination for ADLS as evidenced by decreasing LUE 9 hole peg test score to 29 secs or less.   Baseline RUE 24.50 secs LUE 32.75 secs   Time 4   Period Weeks   Status On-going  36.18 secs           OT Long Term Goals - 02/08/17 1440      OT LONG TERM GOAL #1   Title Pt will perfom mod complex cooking/ home management in standing x 45 mins without rest break or LOB. due 04/08/17   Time 8   Period Weeks   Status New     OT LONG TERM GOAL #2   Title Pt will increase bilateral grip strength to 25 lbs or greater for increased functional use for ADLs.   Time 8   Period Weeks   Status New     OT LONG TERM GOAL #3   Title Pt will demonstrate ability to retrieve 2 lbs from overhead shelf at 125 without drops with RUE   Baseline 120   Time 8   Period Weeks   Status New     OT LONG TERM GOAL #4   Title Pt will demonstrate ability to retrieve 2 lbs item from ovehead shelf at 120 shoulder flexion with LUE   Baseline 110   Time 8   Period Weeks   Status New               Plan - 03/09/17 1637    Clinical Impression Statement Pt progressing towards STG #2 but continues to need reinforcement for safety. Pt appears to have difficulty following instructions even in native language.    Rehab Potential Fair   OT Frequency 2x / week   OT Duration 8 weeks   OT Treatment/Interventions Self-care/ADL training;Moist Heat;Fluidtherapy;DME and/or AE instruction;Splinting;Patient/family education;Balance training;Therapeutic exercises;Ultrasound;Therapeutic exercise;Therapeutic activities;Cognitive remediation/compensation;Passive range of motion;Functional Mobility Training;Neuromuscular education;Cryotherapy;Electrical Stimulation;Parrafin;Energy conservation;Manual Therapy   Plan continue home management tasks in standing (pt to bring in walker with walker tray from home to  practice safety with getting things in/out of refrigerator), simple cooking tasks - daughter request reinforcement of safety with these tasks. Reinforce walker negotiation/proper placement with kitchen tasks   Consulted and Agree with Plan of Care Patient;Family member/caregiver   Family Member Consulted daughter      Patient will benefit from skilled therapeutic intervention in order to improve the following deficits  and impairments:  Abnormal gait, Decreased cognition, Decreased knowledge of use of DME, Pain, Impaired sensation, Decreased mobility, Decreased coordination, Decreased activity tolerance, Decreased endurance, Decreased range of motion, Decreased strength, Impaired UE functional use, Impaired perceived functional ability, Difficulty walking, Decreased safety awareness, Decreased knowledge of precautions, Decreased balance  Visit Diagnosis: Hemiplegia and hemiparesis following cerebral infarction affecting left non-dominant side (HCC)  Unsteadiness on feet  Cognitive social or emotional deficit following cerebral infarction    Problem List Patient Active Problem List   Diagnosis Date Noted  . Primary osteoarthritis of left knee 02/09/2017  . SDH (subdural hematoma) (HCC)   . Urinary frequency   . Type 2 diabetes mellitus with peripheral neuropathy (HCC)   . Cough   . Gait disturbance, post-stroke 10/20/2016  . Hemiparesis affecting left side as late effect of stroke (HCC) 10/20/2016  . Essential hypertension 10/19/2016  . Hyperlipidemia LDL goal <70 10/19/2016  . 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  . Left hemiparesis (HCC)   . Atrial fibrillation (HCC)   . History of fall   . History of traumatic subdural hematoma   . Vascular headache   . Hypokalemia   . Leukocytosis   . Acute blood loss anemia   . Hypoalbuminemia due to protein-calorie malnutrition (HCC)   . Dysphagia, post-stroke   . Acute respiratory failure (HCC)   . Acute embolic stroke (HCC) - R  putamen/caudate and R insular infarcts s/p TICI3 revascularization w/ mechanical thrombectomy d/t AF not on [facility] 10/15/2016  . Pressure injury of skin 10/15/2016  . HCAP (healthcare-associated pneumonia) 11/09/2014  . Weakness 08/25/2014  . Acute bronchitis 08/25/2014  . UTI (lower urinary tract infection) 08/25/2014  . Fracture of lumbar spine (HCC) 08/25/2014  . Subdural hematoma, post-traumatic (HCC) 05/12/2014  . Chronic atrial fibrillation (HCC) 05/12/2014  . Diabetes mellitus (HCC) 05/12/2014  . OSA on CPAP 05/12/2014    Kelli Churn, OTR/L 03/09/2017, 4:40 PM  [facility] [address] [place], [place], [address] Phone: [phone]   Fax:  [phone]  Name: Rayni Nemitz MRN: [id number] Date of Birth: 10/11/1941

## 2017-03-09 NOTE — Therapy (Signed)
[place] [address] [place], [place], [address] Phone: [phone]   Fax:  [phone]  Physical Therapy Treatment  Patient Details  Name: [name] MRN: [id number] Date of Birth: August 21, 1941 Referring Provider: Alysia Penna, MD  Encounter Date: 03/09/2017      PT End of Session - 03/09/17 1652    Visit Number 7   Number of Visits 17   Date for PT Re-Evaluation 04/09/17   Authorization Type Medicare & G-codes with proress note every 10th visit    PT Start Time 1454   PT Stop Time 1535   PT Time Calculation (min) 41 min   Activity Tolerance Patient tolerated treatment well   Behavior During Therapy [facility] for tasks assessed/performed      Past Medical History:  Diagnosis Date  . Atrial fibrillation ([place])   . Chronic back pain    "mid back down into lower back" (08/25/2014)  . Frequent falls   . GERD (gastroesophageal reflux disease)   . Hypertriglyceridemia   . OSA on CPAP   . Osteoarthritis    "knees, hands, back" (08/25/2014)  . Pneumonia ~ 2000 X 1  . Subdural hematoma ([place]) july 2015   S/P fall while on Coumadin  . T12 compression fracture ([place]) 2012  . Type II diabetes mellitus ([place])     Past Surgical History:  Procedure Laterality Date  . APPENDECTOMY  2012  . CATARACT EXTRACTION W/ INTRAOCULAR LENS  IMPLANT, BILATERAL Bilateral 2000's  . IR GENERIC HISTORICAL  10/15/2016   IR PERCUTANEOUS ART THROMBECTOMY/INFUSION INTRACRANIAL INC DIAG ANGIO 10/15/2016 [name], MD [facility]  . IR GENERIC HISTORICAL  12/13/2016   IR RADIOLOGIST EVAL & MGMT 12/13/2016 [facility]  . RADIOLOGY WITH ANESTHESIA N/A 10/15/2016   Procedure: RADIOLOGY WITH ANESTHESIA;  Surgeon: [name], MD;  Location: [place];  Service: Radiology;  Laterality: N/A;  . TOTAL ABDOMINAL HYSTERECTOMY  1990    There were no vitals filed for this visit.      Subjective Assessment - 03/09/17 1457    Subjective  Pt reports dizziness with supine<>sit continues to improve; started a new medication that also gives her some dizziness, lightheadedness.  Pt to get knee brace tomorrow.  Pt reporting discomfort in knee today and asking to stretch first.   Patient is accompained by: Family member   Pertinent History falls with subdural hematoma, Afib, chronic back pain, T12 compression fx, type 2 DM, OA, PNA   Limitations Walking;House hold activities;Other (comment);Standing   Patient Stated Goals To treat her weak legs and walk free of AD   Currently in Pain? Yes   Pain Score 4    Pain Location Knee   Pain Orientation Left   Pain Descriptors / Indicators Aching   Pain Type Chronic pain            [facility] PT Assessment - 03/09/17 1512      Standardized Balance Assessment   Standardized Balance Assessment Berg Balance Test;10 meter walk test   10 Meter Walk 28.49 seconds or 1.48f/sec  reporting increased knee pain today     Berg Balance Test   Sit to Stand Able to stand without using hands and stabilize independently   Standing Unsupported Able to stand safely 2 minutes   Sitting with Back Unsupported but Feet Supported on Floor or Stool Able to sit safely and securely 2 minutes   Stand to Sit Controls descent by using hands   Transfers Able to transfer safely,  minor use of hands   Standing Unsupported with Eyes Closed Able to stand 10 seconds safely   Standing Ubsupported with Feet Together Able to place feet together independently and stand 1 minute safely   From Standing, Reach Forward with Outstretched Arm Can reach confidently >25 cm (10")   From Standing Position, Pick up Object from Floor Able to pick up shoe, needs supervision   From Standing Position, Turn to Look Behind Over each Shoulder Looks behind one side only/other side shows less weight shift   Turn 360 Degrees Able to turn 360 degrees safely but slowly   Standing Unsupported, Alternately Place Feet on Step/Stool Able to stand  independently and safely and complete 8 steps in 20 seconds   Standing Unsupported, One Foot in Front Able to take small step independently and hold 30 seconds   Standing on One Leg Tries to lift leg/unable to hold 3 seconds but remains standing independently   Total Score 46   Berg comment: 46/56    Patient demonstrates significant fall risk as noted by score of  46/56 on Berg Balance Scale.  (<36= high risk for falls, close to 100%; 37-45 significant >80%; 46-51 moderate >50%; 52-55 lower >25%)                  [facility] Adult PT Treatment/Exercise - 03/09/17 1459      Knee/Hip Exercises: Stretches   Passive Hamstring Stretch Left;3 reps;60 seconds  straight, with ABD, with ADD   Hip Flexor Stretch Left;1 rep;60 seconds   Hip Flexor Stretch Limitations off side of mat   Other Knee/Hip Stretches Supine L hip ER stretch into IR with foot on mat x 2 reps x 30 seconds with hand on pelvis to prevent rotation off of mat                PT Education - 03/09/17 1651    Education provided Yes   Education Details progress and areas to continue to focus on   Person(s) Educated Patient;Child(ren)   Methods Explanation   Comprehension Verbalized understanding          PT Short Term Goals - 03/09/17 1532      PT SHORT TERM GOAL #1   Title (TARGET DATE: 03/10/2017) Pt will participate in stair negotiation assessment and vestibular evaluation with goal to be set if needed.   Baseline Both assessed   Status Achieved     PT SHORT TERM GOAL #2   Title Patient decrease falls risk as indicated by a gait velocity of > or = 1.44f/s with SPC.  (TARGET DATE: 03/10/2017)    Baseline 1.29fsec with cane, 1.1562fec on 5/24   Status Not Met     PT SHORT TERM GOAL #3   Title Patient will demonstrate ability to ambulate 250 feet over level surfaces with SPC with 25% cues for increased foot clearance/step length and perform 180 deg turns to L and R with supervision overall. (TARGET  DATE: 03/10/2017)    Time 4   Period Weeks   Status New     PT SHORT TERM GOAL #4   Title TUG will be performed and long term goal to be set. (TARGET DATE: 03/10/2017)    Baseline met 02/17/17   Time 4   Period Weeks   Status Achieved     PT SHORT TERM GOAL #5   Title Pt will improve balance and decrease falls risk as indicated by improvement in BERG score to >or = 37/56  Baseline 30/56; 46/56 on 5/24   Status Achieved           PT Long Term Goals - 02/17/17 1233      PT LONG TERM GOAL #1   Title Patient will demonstrate independence with HEP and verbalize options for community fitness program. (TARGET DATE: 04/07/2017)   Time 8   Period Weeks   Status New     PT LONG TERM GOAL #2   Title Patient will improve gait velocity to >2.0 ft/sec with SPC to indicate pt is safe for limited community ambulation. (TARGET DATE: 04/07/2017)    Time 8   Period Weeks   Status New     PT LONG TERM GOAL #3   Title Patient will score > or = 42/56 on the Berg Balance Test to indicate decreased falls risk. (TARGET DATE: 04/07/2017)    Time 8   Period Weeks   Status New     PT LONG TERM GOAL #4   Title Pt will decrease falls risk with sit <> stand and turning as indicated by TUG score of <18   Baseline 25   Time 8   Period Weeks   Status New     PT LONG TERM GOAL #5   Title Patient will ambulate 500 feet outside over uneven terrain including ramps/curbs with SPC and will negotiate 3 stairs with SPC, 1 rail with supervision for community ambulation. (TARGET DATE: 04/07/2017)   Time 8   Period Weeks   Status New     PT LONG TERM GOAL #6   Title Pt will improve LLE strength to 4/5 overall at hip, knee and ankle   Baseline 3+ hip flexion, 4- knee extension, 4 knee flexion, 3- ankle DF   Time 8   Period Weeks   Status New               Plan - 03/09/17 1652    Clinical Impression Statement Continued to focus on LLE ROM to decrease strain on L knee and pain; initiated  re-assessment of STG.  Pt demonstrated significant improvement in static standing balance, decreased falls risk but little to no improvement in gait speed.  Pt to receive knee brace tomorrow after PT session.  Will finish assessment of STG tomorrow.     Rehab Potential Good   Clinical Impairments Affecting Rehab Potential pain of chronic nature   PT Treatment/Interventions ADLs/Self Care Home Management;Canalith Repostioning;Electrical Stimulation;DME Instruction;Gait training;Stair training;Functional mobility training;Therapeutic activities;Therapeutic exercise;Balance training;Neuromuscular re-education;Cognitive remediation;Patient/family education;Orthotic Fit/Training;Energy conservation;Taping;Vestibular   PT Next Visit Plan Finish checking STG; Treadmill, Standing balance, weight shifting, SLS, foot clearance during gait    Consulted and Agree with Plan of Care Patient;Family member/caregiver   Family Member Consulted daughter      Patient will benefit from skilled therapeutic intervention in order to improve the following deficits and impairments:  Abnormal gait, Decreased endurance, Decreased balance, Decreased cognition, Decreased mobility, Decreased strength, Difficulty walking, Dizziness, Impaired sensation, Pain  Visit Diagnosis: Muscle weakness (generalized)  Unsteadiness on feet  Other abnormalities of gait and mobility  Hemiplegia and hemiparesis following cerebral infarction affecting left non-dominant side (HCC)  Repeated falls  Dizziness and giddiness     Problem List Patient Active Problem List   Diagnosis Date Noted  . Primary osteoarthritis of left knee 02/09/2017  . SDH (subdural hematoma) ([place])   . Urinary frequency   . Type 2 diabetes mellitus with peripheral neuropathy (HCC)   . Cough   . Gait disturbance, post-stroke  10/20/2016  . Hemiparesis affecting left side as late effect of stroke ([place]) 10/20/2016  . Essential hypertension 10/19/2016  .  Hyperlipidemia LDL goal <70 10/19/2016  . 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[place]) 23/95/3202  . Left hemiparesis ([place])   . Atrial fibrillation ([place])   . History of fall   . History of traumatic subdural hematoma   . Vascular headache   . Hypokalemia   . Leukocytosis   . Acute blood loss anemia   . Hypoalbuminemia due to protein-calorie malnutrition ([place])   . Dysphagia, post-stroke   . Acute respiratory failure ([place])   . Acute embolic stroke (HCC) - R putamen/caudate and R insular infarcts s/p TICI3 revascularization w/ mechanical thrombectomy d/t AF not on [facility] 10/15/2016  . Pressure injury of skin 10/15/2016  . HCAP (healthcare-associated pneumonia) 11/09/2014  . Weakness 08/25/2014  . Acute bronchitis 08/25/2014  . UTI (lower urinary tract infection) 08/25/2014  . Fracture of lumbar spine ([place]) 08/25/2014  . Subdural hematoma, post-traumatic ([place]) 05/12/2014  . Chronic atrial fibrillation ([place]) 05/12/2014  . Diabetes mellitus ([place]) 05/12/2014  . OSA on CPAP 05/12/2014   Raylene Everts, PT, DPT 03/09/17    4:56 PM    [place] [address] [place], [place], [address] Phone: [phone]   Fax:  [phone]  Name: Willisha Sligar MRN: [id number] Date of Birth: [birth year]

## 2017-03-10 ENCOUNTER — Ambulatory Visit: Payer: Medicare Other | Admitting: Physical Therapy

## 2017-03-10 ENCOUNTER — Encounter: Payer: Self-pay | Admitting: Physical Therapy

## 2017-03-10 DIAGNOSIS — M6281 Muscle weakness (generalized): Secondary | ICD-10-CM

## 2017-03-10 DIAGNOSIS — R2689 Other abnormalities of gait and mobility: Secondary | ICD-10-CM

## 2017-03-10 DIAGNOSIS — R42 Dizziness and giddiness: Secondary | ICD-10-CM

## 2017-03-10 DIAGNOSIS — R2681 Unsteadiness on feet: Secondary | ICD-10-CM

## 2017-03-10 DIAGNOSIS — I69354 Hemiplegia and hemiparesis following cerebral infarction affecting left non-dominant side: Secondary | ICD-10-CM

## 2017-03-10 DIAGNOSIS — R296 Repeated falls: Secondary | ICD-10-CM

## 2017-03-10 NOTE — Therapy (Signed)
[facility] [address] [place], [place], [address] Phone: [phone]   Fax:  [phone]  Physical Therapy Treatment  Patient Details  Name: [name] MRN: [id number] Date of Birth: 14-Sep-1941 Referring Provider: Alysia Penna, MD  Encounter Date: 03/10/2017      PT End of Session - 03/10/17 1259    Visit Number 8   Number of Visits 17   Date for PT Re-Evaluation 04/09/17   Authorization Type Medicare & G-codes with proress note every 10th visit    PT Start Time 0933   PT Stop Time 1017   PT Time Calculation (min) 44 min   Activity Tolerance Patient tolerated treatment well   Behavior During Therapy [facility] for tasks assessed/performed      Past Medical History:  Diagnosis Date  . Atrial fibrillation ([place])   . Chronic back pain    "mid back down into lower back" (08/25/2014)  . Frequent falls   . GERD (gastroesophageal reflux disease)   . Hypertriglyceridemia   . OSA on CPAP   . Osteoarthritis    "knees, hands, back" (08/25/2014)  . Pneumonia ~ 2000 X 1  . Subdural hematoma ([place]) july 2015   S/P fall while on Coumadin  . T12 compression fracture ([place]) 2012  . Type II diabetes mellitus ([place])     Past Surgical History:  Procedure Laterality Date  . APPENDECTOMY  2012  . CATARACT EXTRACTION W/ INTRAOCULAR LENS  IMPLANT, BILATERAL Bilateral 2000's  . IR GENERIC HISTORICAL  10/15/2016   IR PERCUTANEOUS ART THROMBECTOMY/INFUSION INTRACRANIAL INC DIAG ANGIO 10/15/2016 [name], MD [facility]-INTERV RAD  . IR GENERIC HISTORICAL  12/13/2016   IR RADIOLOGIST EVAL & MGMT 12/13/2016 [facility]  . RADIOLOGY WITH ANESTHESIA N/A 10/15/2016   Procedure: RADIOLOGY WITH ANESTHESIA;  Surgeon: [name], MD;  Location: [facility];  Service: Radiology;  Laterality: N/A;  . TOTAL ABDOMINAL HYSTERECTOMY  1990    There were no vitals filed for this visit.      Subjective Assessment - 03/10/17 0937    Subjective  Pt reporting increased pain in knee today, 5/10.  Reports feeling more tired today and having flexion spasms in LLE last night.  LLE noted to have increased ER today.   Patient is accompained by: Family member   Pertinent History falls with subdural hematoma, Afib, chronic back pain, T12 compression fx, type 2 DM, OA, PNA   Limitations Walking;House hold activities;Other (comment);Standing   Patient Stated Goals To treat her weak legs and walk free of AD   Currently in Pain? Yes   Pain Score 5    Pain Location Knee   Pain Orientation Left   Pain Descriptors / Indicators Aching            [facility] PT Assessment - 03/10/17 0939      Tone   Assessment Location Left Lower Extremity;Right Lower Extremity     RLE Tone   RLE Tone Within Functional Limits     LLE Tone   LLE Tone Within Functional Limits  no clonus or spasticity noted                     [facility] Adult PT Treatment/Exercise - 03/10/17 0944      Ambulation/Gait   Ambulation/Gait Yes   Ambulation/Gait Assistance 4: Min guard   Ambulation/Gait Assistance Details 75% cues for step length, clearance, heel strike   Ambulation Distance (Feet) 250 Feet   Assistive device Large base  quad cane   Ambulation Surface Level;Indoor     Knee/Hip Exercises: Standing   Lateral Step Up Right;Left;Hand Hold: 2;Hand Hold: 1  during contralateral LE soccer ball kicks   Lateral Step Up Limitations increased difficulty weight shifting to R   Other Standing Knee Exercises R and LLE standing on balance beam performing full hip and knee extension with lateral, forwards<>retro weight shifting with contralateral LE stepping forwards and retro over balance beam x 15 reps each     Knee/Hip Exercises: Seated   Sit to Sand without UE support;1 set  12 reps with ADD ball squeeze,                PT Education - 03/09/17 1651    Education provided Yes   Education Details progress and areas to continue to focus on   Person(s)  Educated Patient;Child(ren)   Methods Explanation   Comprehension Verbalized understanding          PT Short Term Goals - 03/10/17 1305      PT SHORT TERM GOAL #1   Title (TARGET DATE: 03/10/2017) Pt will participate in stair negotiation assessment and vestibular evaluation with goal to be set if needed.   Baseline Both assessed   Status Achieved     PT SHORT TERM GOAL #2   Title Patient decrease falls risk as indicated by a gait velocity of > or = 1.54f/s with SPC.  (TARGET DATE: 03/10/2017)    Baseline 1.237fsec with cane, 1.1534fec on 5/24   Status Not Met     PT SHORT TERM GOAL #3   Title Patient will demonstrate ability to ambulate 250 feet over level surfaces with SPC with 25% cues for increased foot clearance/step length and perform 180 deg turns to L and R with supervision overall. (TARGET DATE: 03/10/2017)    Baseline min A x 250' over level surfaces with quad cane, >75% cues   Time 4   Period Weeks   Status Partially Met     PT SHORT TERM GOAL #4   Title TUG will be performed and long term goal to be set. (TARGET DATE: 03/10/2017)    Baseline met 02/17/17   Time 4   Period Weeks   Status Achieved     PT SHORT TERM GOAL #5   Title Pt will improve balance and decrease falls risk as indicated by improvement in BERG score to >or = 37/56   Baseline 30/56; 46/56 on 5/24   Status Achieved           PT Long Term Goals - 02/17/17 1233      PT LONG TERM GOAL #1   Title Patient will demonstrate independence with HEP and verbalize options for community fitness program. (TARGET DATE: 04/07/2017)   Time 8   Period Weeks   Status New     PT LONG TERM GOAL #2   Title Patient will improve gait velocity to >2.0 ft/sec with SPC to indicate pt is safe for limited community ambulation. (TARGET DATE: 04/07/2017)    Time 8   Period Weeks   Status New     PT LONG TERM GOAL #3   Title Patient will score > or = 42/56 on the Berg Balance Test to indicate decreased falls risk.  (TARGET DATE: 04/07/2017)    Time 8   Period Weeks   Status New     PT LONG TERM GOAL #4   Title Pt will decrease falls risk with sit <> stand and turning  as indicated by TUG score of <18   Baseline 25   Time 8   Period Weeks   Status New     PT LONG TERM GOAL #5   Title Patient will ambulate 500 feet outside over uneven terrain including ramps/curbs with SPC and will negotiate 3 stairs with SPC, 1 rail with supervision for community ambulation. (TARGET DATE: 04/07/2017)   Time 8   Period Weeks   Status New     PT LONG TERM GOAL #6   Title Pt will improve LLE strength to 4/5 overall at hip, knee and ankle   Baseline 3+ hip flexion, 4- knee extension, 4 knee flexion, 3- ankle DF   Time 8   Period Weeks   Status New               Plan - 03/10/17 1300    Clinical Impression Statement Treatment session with continued focus on assessment of progress towards STG, LE strengthening, dynamic balance, weight shift and pre-gait training to focus on single limb stance and full foot clearance and step length.  Pt partially met final STG-able to ambulate 250' with cane but requires min guard and >75% verbal and visual cues for full step length, foot clearance and heel strike.  Pt demonstrates increased difficulty with R lateral weight shifting to advance LLE.  Daughter reports before CVA pt's LLE was her stronger, less painful side and tends to keep weight to L. Pt initially reporting pain in knees 5/10 but by end of session after exercises pt reporting decrease in pain.  Pt to receive knee brace after this session.   Rehab Potential Good   Clinical Impairments Affecting Rehab Potential pain of chronic nature   PT Frequency 2x / week   PT Duration 8 weeks   PT Treatment/Interventions ADLs/Self Care Home Management;Canalith Repostioning;Electrical Stimulation;DME Instruction;Gait training;Stair training;Functional mobility training;Therapeutic activities;Therapeutic exercise;Balance  training;Neuromuscular re-education;Cognitive remediation;Patient/family education;Orthotic Fit/Training;Energy conservation;Taping;Vestibular   PT Next Visit Plan with new knee brace: Treadmill, Standing balance, lateral weight shifting-especially to R, SLS, foot clearance during gait    Consulted and Agree with Plan of Care Patient;Family member/caregiver   Family Member Consulted daughter      Patient will benefit from skilled therapeutic intervention in order to improve the following deficits and impairments:  Abnormal gait, Decreased endurance, Decreased balance, Decreased cognition, Decreased mobility, Decreased strength, Difficulty walking, Dizziness, Impaired sensation, Pain  Visit Diagnosis: Muscle weakness (generalized)  Unsteadiness on feet  Other abnormalities of gait and mobility  Hemiplegia and hemiparesis following cerebral infarction affecting left non-dominant side (HCC)  Repeated falls  Dizziness and giddiness     Problem List Patient Active Problem List   Diagnosis Date Noted  . Primary osteoarthritis of left knee 02/09/2017  . SDH (subdural hematoma) ([place])   . Urinary frequency   . Type 2 diabetes mellitus with peripheral neuropathy (HCC)   . Cough   . Gait disturbance, post-stroke 10/20/2016  . Hemiparesis affecting left side as late effect of stroke ([place]) 10/20/2016  . Essential hypertension 10/19/2016  . Hyperlipidemia LDL goal <70 10/19/2016  . 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 Left hemiparesis ([place])   . Atrial fibrillation ([place])   . History of fall   . History of traumatic subdural hematoma   . Vascular headache   . Hypokalemia   . Leukocytosis   . Acute blood loss anemia   . Hypoalbuminemia due to protein-calorie malnutrition ([place])   . Dysphagia, post-stroke   . Acute respiratory failure ([place])   .  Acute embolic stroke (HCC) - R putamen/caudate and R insular infarcts s/p TICI3 revascularization w/ mechanical thrombectomy d/t AF not on  [facility] 10/15/2016  . Pressure injury of skin 10/15/2016  . HCAP (healthcare-associated pneumonia) 11/09/2014  . Weakness 08/25/2014  . Acute bronchitis 08/25/2014  . UTI (lower urinary tract infection) 08/25/2014  . Fracture of lumbar spine ([place]) 08/25/2014  . Subdural hematoma, post-traumatic ([place]) 05/12/2014  . Chronic atrial fibrillation ([place]) 05/12/2014  . Diabetes mellitus ([place]) 05/12/2014  . OSA on CPAP 05/12/2014    Raylene Everts, PT, DPT 03/10/17    1:07 PM    [facility] [address] [place], [place], [address] Phone: [phone]   Fax:  [phone]  Name: Esraa Seres MRN: [id number] Date of Birth: [birth year]

## 2017-03-14 ENCOUNTER — Ambulatory Visit: Payer: Medicare Other | Admitting: Occupational Therapy

## 2017-03-14 ENCOUNTER — Ambulatory Visit: Payer: Medicare Other | Admitting: Physical Therapy

## 2017-03-14 DIAGNOSIS — R2689 Other abnormalities of gait and mobility: Secondary | ICD-10-CM

## 2017-03-14 DIAGNOSIS — M6281 Muscle weakness (generalized): Secondary | ICD-10-CM | POA: Diagnosis not present

## 2017-03-14 DIAGNOSIS — R2681 Unsteadiness on feet: Secondary | ICD-10-CM

## 2017-03-14 DIAGNOSIS — I69315 Cognitive social or emotional deficit following cerebral infarction: Secondary | ICD-10-CM

## 2017-03-14 NOTE — Therapy (Signed)
[facility] [address] [place], [place], [address] Phone: [phone]   Fax:  [phone]  Physical Therapy Treatment  Patient Details  Name: [name] MRN: [id number] Date of Birth: 1941-02-17 Referring Provider: Alysia Penna, MD  Encounter Date: 03/14/2017      PT End of Session - 03/14/17 1649    Visit Number 9   Number of Visits 17   Date for PT Re-Evaluation 04/09/17   Authorization Type Medicare & G-codes with proress note every 10th visit    PT Start Time 1454   PT Stop Time 1535   PT Time Calculation (min) 41 min      Past Medical History:  Diagnosis Date  . Atrial fibrillation ([place])   . Chronic back pain    "mid back down into lower back" (08/25/2014)  . Frequent falls   . GERD (gastroesophageal reflux disease)   . Hypertriglyceridemia   . OSA on CPAP   . Osteoarthritis    "knees, hands, back" (08/25/2014)  . Pneumonia ~ 2000 X 1  . Subdural hematoma ([place]) july 2015   S/P fall while on Coumadin  . T12 compression fracture ([place]) 2012  . Type II diabetes mellitus ([place])     Past Surgical History:  Procedure Laterality Date  . APPENDECTOMY  2012  . CATARACT EXTRACTION W/ INTRAOCULAR LENS  IMPLANT, BILATERAL Bilateral 2000's  . IR GENERIC HISTORICAL  10/15/2016   IR PERCUTANEOUS ART THROMBECTOMY/INFUSION INTRACRANIAL INC DIAG ANGIO 10/15/2016 [name], MD [facility]INTER[facility] RAD  . IR GENERIC HISTORICAL  12/13/2016   IR RADIOLOGIST EVAL & MGMT 12/13/2016 [facility]  . RADIOLOGY WITH ANESTHESIA N/A 10/15/2016   Procedure: RADIOLOGY WITH ANESTHESIA;  Surgeon: [name], MD;  Location: [facility];  Service: Radiology;  Laterality: N/A;  . TOTAL ABDOMINAL HYSTERECTOMY  1990    There were no vitals filed for this visit.      Subjective Assessment - 03/14/17 1639    Subjective Pt obtained brace for Lt knee last Friday - states it helps the left knee pain some but not a whole lot:  daughter  reports pt has been dragging left foot more within past couple weeks   Patient is accompained by: Family member   Pertinent History falls with subdural hematoma, Afib, chronic back pain, T12 compression fx, type 2 DM, OA, PNA   Limitations Walking;House hold activities;Other (comment);Standing   Patient Stated Goals To treat her weak legs and walk free of AD   Currently in Pain? Yes   Pain Score 7    Pain Location Knee   Pain Orientation Left   Pain Descriptors / Indicators Aching   Pain Type Chronic pain   Pain Onset More than a month ago   Pain Frequency Intermittent   Multiple Pain Sites No                         [facility] Adult PT Treatment/Exercise - 03/14/17 1459      Ambulation/Gait   Ambulation/Gait Yes   Ambulation/Gait Assistance 5: Supervision   Ambulation Distance (Feet) 60 Feet  inside // bars with mirror for visual feedback   Assistive device Parallel bars   Gait Pattern Step-to pattern;Decreased step length - right;Decreased step length - left;Decreased stride length;Decreased hip/knee flexion - right;Decreased hip/knee flexion - left;Decreased dorsiflexion - left;Decreased dorsiflexion - right;Decreased weight shift to right;Decreased weight shift to left;Shuffle;Decreased trunk rotation;Trunk flexed;Poor foot clearance - left;Poor foot clearance - right  Ambulation Surface Level;Indoor     Pt gait trained approx. 100' at end of session with [facility] with SBA with cues to increase initial heel contact and to internally rotate  LLE         Balance Exercises - 03/14/17 1647      Balance Exercises: Standing   Rockerboard Anterior/posterior;30 seconds;Intermittent UE support   Sidestepping 2 reps  along counter   Turning Left  cues to attend to LLE     Neuro Re-ed:  Pt performed SLS activities - touching balance bubbles with Rt foot/Lt foot for weight shift and for improved  SLS on each leg with RUE on counter prn with CGA  Pt performed ladder  activity with hand held assist (without SBQC) 3 reps to facilitate increased step length and to improve SLS Stepping over and back of balance bubbles (3) for improved SLS with min assist for balance recovery  Marching in place  Inside // bars with UE support prn        PT Short Term Goals - 03/10/17 1305      PT SHORT TERM GOAL #1   Title (TARGET DATE: 03/10/2017) Pt will participate in stair negotiation assessment and vestibular evaluation with goal to be set if needed.   Baseline Both assessed   Status Achieved     PT SHORT TERM GOAL #2   Title Patient decrease falls risk as indicated by a gait velocity of > or = 1.25f/s with SPC.  (TARGET DATE: 03/10/2017)    Baseline 1.265fsec with cane, 1.1537fec on 5/24   Status Not Met     PT SHORT TERM GOAL #3   Title Patient will demonstrate ability to ambulate 250 feet over level surfaces with SPC with 25% cues for increased foot clearance/step length and perform 180 deg turns to L and R with supervision overall. (TARGET DATE: 03/10/2017)    Baseline min A x 250' over level surfaces with quad cane, >75% cues   Time 4   Period Weeks   Status Partially Met     PT SHORT TERM GOAL #4   Title TUG will be performed and long term goal to be set. (TARGET DATE: 03/10/2017)    Baseline met 02/17/17   Time 4   Period Weeks   Status Achieved     PT SHORT TERM GOAL #5   Title Pt will improve balance and decrease falls risk as indicated by improvement in BERG score to >or = 37/56   Baseline 30/56; 46/56 on 5/24   Status Achieved           PT Long Term Goals - 02/17/17 1233      PT LONG TERM GOAL #1   Title Patient will demonstrate independence with HEP and verbalize options for community fitness program. (TARGET DATE: 04/07/2017)   Time 8   Period Weeks   Status New     PT LONG TERM GOAL #2   Title Patient will improve gait velocity to >2.0 ft/sec with SPC to indicate pt is safe for limited community ambulation. (TARGET DATE: 04/07/2017)     Time 8   Period Weeks   Status New     PT LONG TERM GOAL #3   Title Patient will score > or = 42/56 on the Berg Balance Test to indicate decreased falls risk. (TARGET DATE: 04/07/2017)    Time 8   Period Weeks   Status New     PT LONG TERM GOAL #4   Title Pt will  decrease falls risk with sit <> stand and turning as indicated by TUG score of <18   Baseline 25   Time 8   Period Weeks   Status New     PT LONG TERM GOAL #5   Title Patient will ambulate 500 feet outside over uneven terrain including ramps/curbs with SPC and will negotiate 3 stairs with SPC, 1 rail with supervision for community ambulation. (TARGET DATE: 04/07/2017)   Time 8   Period Weeks   Status New     PT LONG TERM GOAL #6   Title Pt will improve LLE strength to 4/5 overall at hip, knee and ankle   Baseline 3+ hip flexion, 4- knee extension, 4 knee flexion, 3- ankle DF   Time 8   Period Weeks   Status New               Plan - 03/14/17 1651    Clinical Impression Statement Pt demonstrates decreased mild Lt inattention and also gait deviations exhibited including decreased Lt foot clearance in swing with LLE externally rotated -pt using quad cane today and wearing brace on LLE for knee pain, however, pt reports pain continues in Lt knee (lateral knee ) but states brace helps a little with pain reduction.  Pt able to increase Lt foot clearance in swing with verbal cues for incr. initial heel contact.                                                                                                              Rehab Potential Good   Clinical Impairments Affecting Rehab Potential pain of chronic nature   PT Frequency 2x / week   PT Duration 8 weeks   PT Treatment/Interventions ADLs/Self Care Home Management;Canalith Repostioning;Electrical Stimulation;DME Instruction;Gait training;Stair training;Functional mobility training;Therapeutic activities;Therapeutic exercise;Balance training;Neuromuscular  re-education;Cognitive remediation;Patient/family education;Orthotic Fit/Training;Energy conservation;Taping;Vestibular   PT Next Visit Plan G Code due next visit:  with new knee brace: Treadmill, Standing balance, lateral weight shifting-especially to R, SLS, foot clearance during gait   Treadmill not done 03-14-17 due to safety concerns with gait   Consulted and Agree with Plan of Care Patient;Family member/caregiver   Family Member Consulted daughter      Patient will benefit from skilled therapeutic intervention in order to improve the following deficits and impairments:  Abnormal gait, Decreased endurance, Decreased balance, Decreased cognition, Decreased mobility, Decreased strength, Difficulty walking, Dizziness, Impaired sensation, Pain  Visit Diagnosis: Other abnormalities of gait and mobility  Unsteadiness on feet     Problem List Patient Active Problem List   Diagnosis Date Noted  . Primary osteoarthritis of left knee 02/09/2017  . SDH (subdural hematoma) ([place])   . Urinary frequency   . Type 2 diabetes mellitus with peripheral neuropathy (HCC)   . Cough   . Gait disturbance, post-stroke 10/20/2016  . Hemiparesis affecting left side as late effect of stroke ([place]) 10/20/2016  . Essential hypertension 10/19/2016  . Hyperlipidemia LDL goal <70 10/19/2016  . 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 Left hemiparesis ([place])   .  Atrial fibrillation ([place])   . History of fall   . History of traumatic subdural hematoma   . Vascular headache   . Hypokalemia   . Leukocytosis   . Acute blood loss anemia   . Hypoalbuminemia due to protein-calorie malnutrition ([place])   . Dysphagia, post-stroke   . Acute respiratory failure ([place])   . Acute embolic stroke (HCC) - R putamen/caudate and R insular infarcts s/p TICI3 revascularization w/ mechanical thrombectomy d/t AF not on [facility] 10/15/2016  . Pressure injury of skin 10/15/2016  . HCAP (healthcare-associated pneumonia) 11/09/2014  .  Weakness 08/25/2014  . Acute bronchitis 08/25/2014  . UTI (lower urinary tract infection) 08/25/2014  . Fracture of lumbar spine ([place]) 08/25/2014  . Subdural hematoma, post-traumatic ([place]) 05/12/2014  . Chronic atrial fibrillation ([place]) 05/12/2014  . Diabetes mellitus ([place]) 05/12/2014  . OSA on CPAP 05/12/2014    Zainah Steven, [place], PT 03/14/2017, 5:03 PM  [facility] [address] [place], [place], [address] Phone: [phone]   Fax:  [phone]  Name: [name] MRN: [id number] Date of Birth: 11-Dec-1940

## 2017-03-15 ENCOUNTER — Encounter: Payer: Self-pay | Admitting: Physical Therapy

## 2017-03-15 ENCOUNTER — Ambulatory Visit: Payer: Medicare Other | Admitting: Occupational Therapy

## 2017-03-15 ENCOUNTER — Ambulatory Visit: Payer: Medicare Other | Admitting: Physical Therapy

## 2017-03-15 DIAGNOSIS — M6281 Muscle weakness (generalized): Secondary | ICD-10-CM

## 2017-03-15 DIAGNOSIS — R296 Repeated falls: Secondary | ICD-10-CM

## 2017-03-15 DIAGNOSIS — R42 Dizziness and giddiness: Secondary | ICD-10-CM

## 2017-03-15 DIAGNOSIS — R2681 Unsteadiness on feet: Secondary | ICD-10-CM

## 2017-03-15 DIAGNOSIS — I69354 Hemiplegia and hemiparesis following cerebral infarction affecting left non-dominant side: Secondary | ICD-10-CM

## 2017-03-15 DIAGNOSIS — R2689 Other abnormalities of gait and mobility: Secondary | ICD-10-CM

## 2017-03-15 NOTE — Therapy (Signed)
[facility] [address] [place], [place], [address] Phone: [phone]   Fax:  [phone]  Occupational Therapy Treatment  Patient Details  Name: [name] MRN: [id number] Date of Birth: 1941/03/08 Referring Provider: Dr. Wynn Banker  Encounter Date: 03/14/2017      OT End of Session - 03/14/17 1612    Visit Number 9   Number of Visits 17   Date for OT Re-Evaluation 04/08/17   Authorization Type Medicare/ Medicaid   Authorization Time Period 60 days   Authorization - Visit Number 9   Authorization - Number of Visits 10   OT Start Time 1535   OT Stop Time 1615   OT Time Calculation (min) 40 min   Activity Tolerance Patient tolerated treatment well   Behavior During Therapy [facility] for tasks assessed/performed      Past Medical History:  Diagnosis Date  . Atrial fibrillation (HCC)   . Chronic back pain    "mid back down into lower back" (08/25/2014)  . Frequent falls   . GERD (gastroesophageal reflux disease)   . Hypertriglyceridemia   . OSA on CPAP   . Osteoarthritis    "knees, hands, back" (08/25/2014)  . Pneumonia ~ 2000 X 1  . Subdural hematoma (HCC) july 2015   S/P fall while on Coumadin  . T12 compression fracture (HCC) 2012  . Type II diabetes mellitus (HCC)     Past Surgical History:  Procedure Laterality Date  . APPENDECTOMY  2012  . CATARACT EXTRACTION W/ INTRAOCULAR LENS  IMPLANT, BILATERAL Bilateral 2000's  . IR GENERIC HISTORICAL  10/15/2016   IR PERCUTANEOUS ART THROMBECTOMY/INFUSION INTRACRANIAL INC DIAG ANGIO 10/15/2016 [name], MD [facility]INTER[facility] RAD  . IR GENERIC HISTORICAL  12/13/2016   IR RADIOLOGIST EVAL & MGMT 12/13/2016 [facility]  . RADIOLOGY WITH ANESTHESIA N/A 10/15/2016   Procedure: RADIOLOGY WITH ANESTHESIA;  Surgeon: [name], MD;  Location: [facility] OR;  Service: Radiology;  Laterality: N/A;  . TOTAL ABDOMINAL HYSTERECTOMY  1990    There were no vitals filed for this  visit.      Subjective Assessment - 03/14/17 1528    Pertinent History R MCA CVA, hx of fall with subdural hematoma, A-fib, PNA   Patient Stated Goals return to cooking   Currently in Pain? No/denies           Treatment: Simulated home management ambulating with walker with tray to retrieve items from refrigerator and replace, followed by a scavenger hunt at wlker level to retreive items from various surfaces high and low, with close supervision, no LOB and min v.c for walker safety and left side awareness. Pt bumped into a chair on left side and was noted to drag left foot. Arm bike x 5 mins level 1 for conditioning, reciprocal movement.                     OT Short Term Goals - 03/14/17 1613      OT SHORT TERM GOAL #1   Title Pt/ caregiver will be I with HEP. due 03/09/17   Time 4   Period Weeks   Status Achieved     OT SHORT TERM GOAL #2   Title Pt will perform simple cooking/ home management in standing x 25 mins without rest break or LOB.   Time 4   Period Weeks   Status Achieved  Pt still requires close supervision     OT SHORT TERM GOAL #3   Title  Pt/ caregiver will verbalize understanding of compensatory stategies for short term memory deficits.   Time 4   Period Weeks   Status Achieved     OT SHORT TERM GOAL #4   Title Test standing functional reach and set goal prn   Status Deferred     OT SHORT TERM GOAL #5   Title Pt will demonstrate improved fine motor coordination for ADLS as evidenced by decreasing LUE 9 hole peg test score to 29 secs or less.   Baseline RUE 24.50 secs LUE 32.75 secs   Time 4   Period Weeks   Status On-going  36.18 secs           OT Long Term Goals - 02/08/17 1440      OT LONG TERM GOAL #1   Title Pt will perfom mod complex cooking/ home management in standing x 45 mins without rest break or LOB. due 04/08/17   Time 8   Period Weeks   Status New     OT LONG TERM GOAL #2   Title Pt will increase bilateral  grip strength to 25 lbs or greater for increased functional use for ADLs.   Time 8   Period Weeks   Status New     OT LONG TERM GOAL #3   Title Pt will demonstrate ability to retrieve 2 lbs from overhead shelf at 125 without drops with RUE   Baseline 120   Time 8   Period Weeks   Status New     OT LONG TERM GOAL #4   Title Pt will demonstrate ability to retrieve 2 lbs item from ovehead shelf at 120 shoulder flexion with LUE   Baseline 110   Time 8   Period Weeks   Status New               Plan - 03/14/17 1614    Clinical Impression Statement Pt is progressing towards goals. She demonstrates improving standing balance yet pt continues to demonstrate decreased left side awareness and she was noted to bump into a chair on left side .   Rehab Potential Fair   OT Frequency 2x / week   OT Duration 8 weeks   OT Treatment/Interventions Self-care/ADL training;Moist Heat;Fluidtherapy;DME and/or AE instruction;Splinting;Patient/family education;Balance training;Therapeutic exercises;Ultrasound;Therapeutic exercise;Therapeutic activities;Cognitive remediation/compensation;Passive range of motion;Functional Mobility Training;Neuromuscular education;Cryotherapy;Electrical Stimulation;Parrafin;Energy conservation;Manual Therapy   Consulted and Agree with Plan of Care Patient;Family member/caregiver   Family Member Consulted daughter   Plan Continue functional balance, reinforce walker safety and encourage scanning to left side.      Patient will benefit from skilled therapeutic intervention in order to improve the following deficits and impairments:  Abnormal gait, Decreased cognition, Decreased knowledge of use of DME, Pain, Impaired sensation, Decreased mobility, Decreased coordination, Decreased activity tolerance, Decreased endurance, Decreased range of motion, Decreased strength, Impaired UE functional use, Impaired perceived functional ability, Difficulty walking, Decreased safety  awareness, Decreased knowledge of precautions, Decreased balance  Visit Diagnosis: Muscle weakness (generalized)  Unsteadiness on feet  Other abnormalities of gait and mobility  Cognitive social or emotional deficit following cerebral infarction    Problem List Patient Active Problem List   Diagnosis Date Noted  . Primary osteoarthritis of left knee 02/09/2017  . SDH (subdural hematoma) (HCC)   . Urinary frequency   . Type 2 diabetes mellitus with peripheral neuropathy (HCC)   . Cough   . Gait disturbance, post-stroke 10/20/2016  . Hemiparesis affecting left side as late effect of stroke (HCC) 10/20/2016  .  Essential hypertension 10/19/2016  . Hyperlipidemia LDL goal <70 10/19/2016  . 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  . Left hemiparesis (HCC)   . Atrial fibrillation (HCC)   . History of fall   . History of traumatic subdural hematoma   . Vascular headache   . Hypokalemia   . Leukocytosis   . Acute blood loss anemia   . Hypoalbuminemia due to protein-calorie malnutrition (HCC)   . Dysphagia, post-stroke   . Acute respiratory failure (HCC)   . Acute embolic stroke (HCC) - R putamen/caudate and R insular infarcts s/p TICI3 revascularization w/ mechanical thrombectomy d/t AF not on [facility] 10/15/2016  . Pressure injury of skin 10/15/2016  . HCAP (healthcare-associated pneumonia) 11/09/2014  . Weakness 08/25/2014  . Acute bronchitis 08/25/2014  . UTI (lower urinary tract infection) 08/25/2014  . Fracture of lumbar spine (HCC) 08/25/2014  . Subdural hematoma, post-traumatic (HCC) 05/12/2014  . Chronic atrial fibrillation (HCC) 05/12/2014  . Diabetes mellitus (HCC) 05/12/2014  . OSA on CPAP 05/12/2014    RINE,KATHRYN 03/15/2017, 9:18 AM Keene Breath, OTR/L [phone] Phone: [phone] 9:21 AM 03/15/17 [facility] [address] [place], [place], [address] Phone: [phone]   Fax:   [phone]  Name: [name] MRN: [id number] Date of Birth: [birth year]

## 2017-03-15 NOTE — Therapy (Signed)
[facility] [address] [place], [place], [address] Phone: [phone]   Fax:  [phone]  Physical Therapy Treatment  Patient Details  Name: [name] MRN: [id number] Date of Birth: July 11, 1941 Referring Provider: Alysia Penna, MD  Encounter Date: 03/15/2017      PT End of Session - 03/15/17 1655    Visit Number 10   Number of Visits 17   Date for PT Re-Evaluation 04/09/17   Authorization Type Medicare & G-codes with proress note every 10th visit    PT Start Time 1449   PT Stop Time 1534   PT Time Calculation (min) 45 min   Activity Tolerance Patient tolerated treatment well   Behavior During Therapy [facility] for tasks assessed/performed      Past Medical History:  Diagnosis Date  . Atrial fibrillation ([place])   . Chronic back pain    "mid back down into lower back" (08/25/2014)  . Frequent falls   . GERD (gastroesophageal reflux disease)   . Hypertriglyceridemia   . OSA on CPAP   . Osteoarthritis    "knees, hands, back" (08/25/2014)  . Pneumonia ~ 2000 X 1  . Subdural hematoma ([place]) july 2015   S/P fall while on Coumadin  . T12 compression fracture ([place]) 2012  . Type II diabetes mellitus ([place])     Past Surgical History:  Procedure Laterality Date  . APPENDECTOMY  2012  . CATARACT EXTRACTION W/ INTRAOCULAR LENS  IMPLANT, BILATERAL Bilateral 2000's  . IR GENERIC HISTORICAL  10/15/2016   IR PERCUTANEOUS ART THROMBECTOMY/INFUSION INTRACRANIAL INC DIAG ANGIO 10/15/2016 [name], MD [facility]  . IR GENERIC HISTORICAL  12/13/2016   IR RADIOLOGIST EVAL & MGMT 12/13/2016 [facility]  . RADIOLOGY WITH ANESTHESIA N/A 10/15/2016   Procedure: RADIOLOGY WITH ANESTHESIA;  Surgeon: [name], MD;  Location: [place];  Service: Radiology;  Laterality: N/A;  . TOTAL ABDOMINAL HYSTERECTOMY  1990    There were no vitals filed for this visit.      Subjective Assessment - 03/15/17 1456    Subjective  No issues since yesterday; pt reports knee feels more stable in brace but pain has not resolved with use of brace.  Daughter asking if pt would be a good candidate for a knee replacement.   Patient is accompained by: Family member   Pertinent History falls with subdural hematoma, Afib, chronic back pain, T12 compression fx, type 2 DM, OA, PNA   Limitations Walking;House hold activities;Other (comment);Standing   Patient Stated Goals To treat her weak legs and walk free of AD   Currently in Pain? No/denies                         [facility] Adult PT Treatment/Exercise - 03/15/17 1648      Knee/Hip Exercises: Aerobic   Tread Mill x 5 minutes at 1.3-1.4 mph with max-total verbal, visual and manual facilitation at pelvis for lateral weight shifting and rotation to assist with full step length, heel strike and foot clearance.  Pt able to demonstrate normal gait sequence with full step length and foot clearance <15% of the time and only for 3-4 steps.  Pt states that her biggest barrier is feeling unsteady and as if she is going to fall.             Balance Exercises - 03/15/17 1644      Balance Exercises: Standing   Rockerboard Anterior/posterior;EO;10 reps;UE support  on half foam  roller   Step Over Hurdles / Cones R then L foot on half foam roller performing ankle DF<>PF with forwards and retro weight shifting with contralateral LE performing forwards and retro step overs to improve lateral weight shifting, step length and foot clearance           PT Education - 03/15/17 1652    Education provided Yes   Education Details discussed recertification and incorporating pelvic floor PT with new certification due to urology referral.     Person(s) Educated Patient;Child(ren)   Methods Explanation   Comprehension Verbalized understanding          PT Short Term Goals - 03/10/17 1305      PT SHORT TERM GOAL #1   Title (TARGET DATE: 03/10/2017) Pt will participate in stair  negotiation assessment and vestibular evaluation with goal to be set if needed.   Baseline Both assessed   Status Achieved     PT SHORT TERM GOAL #2   Title Patient decrease falls risk as indicated by a gait velocity of > or = 1.71f/s with SPC.  (TARGET DATE: 03/10/2017)    Baseline 1.234fsec with cane, 1.1575fec on 5/24   Status Not Met     PT SHORT TERM GOAL #3   Title Patient will demonstrate ability to ambulate 250 feet over level surfaces with SPC with 25% cues for increased foot clearance/step length and perform 180 deg turns to L and R with supervision overall. (TARGET DATE: 03/10/2017)    Baseline min A x 250' over level surfaces with quad cane, >75% cues   Time 4   Period Weeks   Status Partially Met     PT SHORT TERM GOAL #4   Title TUG will be performed and long term goal to be set. (TARGET DATE: 03/10/2017)    Baseline met 02/17/17   Time 4   Period Weeks   Status Achieved     PT SHORT TERM GOAL #5   Title Pt will improve balance and decrease falls risk as indicated by improvement in BERG score to >or = 37/56   Baseline 30/56; 46/56 on 5/24   Status Achieved           PT Long Term Goals - 02/17/17 1233      PT LONG TERM GOAL #1   Title Patient will demonstrate independence with HEP and verbalize options for community fitness program. (TARGET DATE: 04/07/2017)   Time 8   Period Weeks   Status New     PT LONG TERM GOAL #2   Title Patient will improve gait velocity to >2.0 ft/sec with SPC to indicate pt is safe for limited community ambulation. (TARGET DATE: 04/07/2017)    Time 8   Period Weeks   Status New     PT LONG TERM GOAL #3   Title Patient will score > or = 42/56 on the Berg Balance Test to indicate decreased falls risk. (TARGET DATE: 04/07/2017)    Time 8   Period Weeks   Status New     PT LONG TERM GOAL #4   Title Pt will decrease falls risk with sit <> stand and turning as indicated by TUG score of <18   Baseline 25   Time 8   Period Weeks    Status New     PT LONG TERM GOAL #5   Title Patient will ambulate 500 feet outside over uneven terrain including ramps/curbs with SPC and will negotiate 3 stairs with SPC, 1 rail  with supervision for community ambulation. (TARGET DATE: 04/07/2017)   Time 8   Period Weeks   Status New     PT LONG TERM GOAL #6   Title Pt will improve LLE strength to 4/5 overall at hip, knee and ankle   Baseline 3+ hip flexion, 4- knee extension, 4 knee flexion, 3- ankle DF   Time 8   Period Weeks   Status New               Plan - 2017/04/07 1655    Clinical Impression Statement Continued to focus on weight shifting laterally and anterior during stepping and then on treadmill to continue to facilitate full step length bilaterally, increased stance time, heel strike and foot clearance during gait to improve gait velocity and decrease falls risk.     Rehab Potential Good   Clinical Impairments Affecting Rehab Potential pain of chronic nature   PT Frequency 2x / week   PT Duration 8 weeks   PT Treatment/Interventions ADLs/Self Care Home Management;Canalith Repostioning;Electrical Stimulation;DME Instruction;Gait training;Stair training;Functional mobility training;Therapeutic activities;Therapeutic exercise;Balance training;Neuromuscular re-education;Cognitive remediation;Patient/family education;Orthotic Fit/Training;Energy conservation;Taping;Vestibular   PT Next Visit Plan Treadmill, Standing balance, lateral weight shifting-especially to R, tandem stance with weight shifting, SLS, foot clearance during gait       Patient will benefit from skilled therapeutic intervention in order to improve the following deficits and impairments:  Abnormal gait, Decreased endurance, Decreased balance, Decreased cognition, Decreased mobility, Decreased strength, Difficulty walking, Dizziness, Impaired sensation, Pain  Visit Diagnosis: Muscle weakness (generalized)  Unsteadiness on feet  Other abnormalities of gait  and mobility  Hemiplegia and hemiparesis following cerebral infarction affecting left non-dominant side (HCC)  Repeated falls  Dizziness and giddiness       G-Codes - 04-07-17 1655    Functional Assessment Tool Used (Outpatient Only) Berg Balance test score 46/56   Functional Limitation Mobility: Walking and moving around   Mobility: Walking and Moving Around Current Status [phone]) At least 1 percent but less than 20 percent impaired, limited or restricted   Mobility: Walking and Moving Around Goal Status [phone]) At least 1 percent but less than 20 percent impaired, limited or restricted      Problem List Patient Active Problem List   Diagnosis Date Noted  . Primary osteoarthritis of left knee 02/09/2017  . SDH (subdural hematoma) ([place])   . Urinary frequency   . Type 2 diabetes mellitus with peripheral neuropathy (HCC)   . Cough   . Gait disturbance, post-stroke 10/20/2016  . Hemiparesis affecting left side as late effect of stroke ([place]) 10/20/2016  . Essential hypertension 10/19/2016  . Hyperlipidemia LDL goal <70 10/19/2016  . 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[place]) 67/59/1638  . Left hemiparesis ([place])   . Atrial fibrillation ([place])   . History of fall   . History of traumatic subdural hematoma   . Vascular headache   . Hypokalemia   . Leukocytosis   . Acute blood loss anemia   . Hypoalbuminemia due to protein-calorie malnutrition ([place])   . Dysphagia, post-stroke   . Acute respiratory failure ([place])   . Acute embolic stroke (HCC) - R putamen/caudate and R insular infarcts s/p TICI3 revascularization w/ mechanical thrombectomy d/t AF not on [facility] 10/15/2016  . Pressure injury of skin 10/15/2016  . HCAP (healthcare-associated pneumonia) 11/09/2014  . Weakness 08/25/2014  . Acute bronchitis 08/25/2014  . UTI (lower urinary tract infection) 08/25/2014  . Fracture of lumbar spine ([place]) 08/25/2014  . Subdural hematoma, post-traumatic ([place]) 05/12/2014  . Chronic  atrial fibrillation ([place])  05/12/2014  . Diabetes mellitus ([place]) 05/12/2014  . OSA on CPAP 05/12/2014   Physical Therapy Progress Note  Dates of Reporting Period: 02/08/2017 to 03/15/2017  Objective Reports of Subjective Statement: See above  Objective Measurements: BERG 46/56  Goal Update: See STG above  Plan: Continue with POC  Reason Skilled Services are Required: to continue to address strength, balance, postural control, gait, endurance impairments and to decrease falls risk.  Raylene Everts, PT, DPT 03/15/17    5:04 PM    [facility] [address] [place], [place], [address] Phone: [phone]   Fax:  [phone]  Name: Kanoelani Dobies MRN: [id number] Date of Birth: Mar 28, 1941

## 2017-03-15 NOTE — Therapy (Signed)
[facility] [address] [place], [place], [address] Phone: [phone]   Fax:  [phone]  Occupational Therapy Treatment  Patient Details  Name: [name] MRN: [id number] Date of Birth: 02/19/1941 Referring Provider: Dr. Wynn BankerKirsteins  Encounter Date: 03/15/2017      OT End of Session - 03/15/17 1606    Visit Number 10   Number of Visits 17   Date for OT Re-Evaluation 04/08/17   Authorization Type Medicare/ Medicaid   Authorization Time Period 60 days   Authorization - Visit Number 10   Authorization - Number of Visits 10   OT Start Time 1405   OT Stop Time 1445   OT Time Calculation (min) 40 min   Activity Tolerance Patient tolerated treatment well   Behavior During Therapy [facility] for tasks assessed/performed      Past Medical History:  Diagnosis Date  . Atrial fibrillation (HCC)   . Chronic back pain    "mid back down into lower back" (08/25/2014)  . Frequent falls   . GERD (gastroesophageal reflux disease)   . Hypertriglyceridemia   . OSA on CPAP   . Osteoarthritis    "knees, hands, back" (08/25/2014)  . Pneumonia ~ 2000 X 1  . Subdural hematoma (HCC) july 2015   S/P fall while on Coumadin  . T12 compression fracture (HCC) 2012  . Type II diabetes mellitus (HCC)     Past Surgical History:  Procedure Laterality Date  . APPENDECTOMY  2012  . CATARACT EXTRACTION W/ INTRAOCULAR LENS  IMPLANT, BILATERAL Bilateral 2000's  . IR GENERIC HISTORICAL  10/15/2016   IR PERCUTANEOUS ART THROMBECTOMY/INFUSION INTRACRANIAL INC DIAG ANGIO 10/15/2016 Julieanne CottonSanjeev Deveshwar, MD [facility]  . IR GENERIC HISTORICAL  12/13/2016   IR RADIOLOGIST EVAL & MGMT 12/13/2016 [facility]  . RADIOLOGY WITH ANESTHESIA N/A 10/15/2016   Procedure: RADIOLOGY WITH ANESTHESIA;  Surgeon: Julieanne CottonSanjeev Deveshwar, MD;  Location: [facility];  Service: Radiology;  Laterality: N/A;  . TOTAL ABDOMINAL HYSTERECTOMY  1990    There were no vitals filed for  this visit.      Subjective Assessment - 03/15/17 1438    Pertinent History R MCA CVA, hx of fall with subdural hematoma, A-fib, PNA   Patient Stated Goals return to cooking   Currently in Pain? No/denies          Treatment; Dynamic functional reaching to place coins in container on elevated surface in standing with RLE on a block to encourage weight shift to left leg and to increase awareness with min-mod v.c followed by reaching with LUE in seated and standing to place graded clothespins on vertical antennae for sustained pinch, min-mod difficulty  Environmental scanning while ambulating with walker to retrieve playing cards on right and left sides, mod v.c for walker safety. Arm bike x 5 mins level 1 for conditioning.                      OT Short Term Goals - 03/14/17 1613      OT SHORT TERM GOAL #1   Title Pt/ caregiver will be I with HEP. due 03/09/17   Time 4   Period Weeks   Status Achieved     OT SHORT TERM GOAL #2   Title Pt will perform simple cooking/ home management in standing x 25 mins without rest break or LOB.   Time 4   Period Weeks   Status Achieved  Pt still requires close supervision  OT SHORT TERM GOAL #3   Title Pt/ caregiver will verbalize understanding of compensatory stategies for short term memory deficits.   Time 4   Period Weeks   Status Achieved     OT SHORT TERM GOAL #4   Title Test standing functional reach and set goal prn   Status Deferred     OT SHORT TERM GOAL #5   Title Pt will demonstrate improved fine motor coordination for ADLS as evidenced by decreasing LUE 9 hole peg test score to 29 secs or less.   Baseline RUE 24.50 secs LUE 32.75 secs   Time 4   Period Weeks   Status On-going  36.18 secs           OT Long Term Goals - 02/08/17 1440      OT LONG TERM GOAL #1   Title Pt will perfom mod complex cooking/ home management in standing x 45 mins without rest break or LOB. due 04/08/17   Time 8    Period Weeks   Status New     OT LONG TERM GOAL #2   Title Pt will increase bilateral grip strength to 25 lbs or greater for increased functional use for ADLs.   Time 8   Period Weeks   Status New     OT LONG TERM GOAL #3   Title Pt will demonstrate ability to retrieve 2 lbs from overhead shelf at 125 without drops with RUE   Baseline 120   Time 8   Period Weeks   Status New     OT LONG TERM GOAL #4   Title Pt will demonstrate ability to retrieve 2 lbs item from ovehead shelf at 120 shoulder flexion with LUE   Baseline 110   Time 8   Period Weeks   Status New               Plan - 03/14/17 1614    Clinical Impression Statement Pt is progressing towards goals. She demonstrates improving standing balance yet pt continues to demonstrate decreased left side awareness and she was noted to bump into a chair on left side .   Rehab Potential Fair   OT Frequency 2x / week   OT Duration 8 weeks   OT Treatment/Interventions Self-care/ADL training;Moist Heat;Fluidtherapy;DME and/or AE instruction;Splinting;Patient/family education;Balance training;Therapeutic exercises;Ultrasound;Therapeutic exercise;Therapeutic activities;Cognitive remediation/compensation;Passive range of motion;Functional Mobility Training;Neuromuscular education;Cryotherapy;Electrical Stimulation;Parrafin;Energy conservation;Manual Therapy   Plan Continue functional balance, reinforce walker safety and encourage scanning to left side.   Consulted and Agree with Plan of Care Patient;Family member/caregiver   Family Member Consulted daughter      Patient will benefit from skilled therapeutic intervention in order to improve the following deficits and impairments:     Visit Diagnosis: Muscle weakness (generalized)  Hemiplegia and hemiparesis following cerebral infarction affecting left non-dominant side (HCC)  Unsteadiness on feet  Other abnormalities of gait and mobility      G-Codes - 2017-04-10 1608     Functional Assessment Tool Used (Outpatient only) clinical impressions, improved LUE shoulder flexion grossly 115, continues to require close supervsion during home management due to left neglect   Functional Limitation Self care   Self Care Current Status (O9629) At least 20 percent but less than 40 percent impaired, limited or restricted   Self Care Goal Status (B2841) At least 1 percent but less than 20 percent impaired, limited or restricted    Occupational Therapy Progress Note  Dates of Reporting Period: 02/08/17 to 10-Apr-2017  Objective Reports of  Subjective Statement: improved LUE functional reach grossly 115 shoulder flexion  Objective Measurements: see above, 9 hole peg test LUE -36.18 secs  Goal Update: see above  Plan: work towards unmet goals  Reason Skilled Services are Required: Pt can benefit from skilled occupational therapy to address the following deficits; decreased strength, decreased coordination, decreased balance, left neglect, cognitive deficits in order to maximize pt's safety and independence with ADLs/ IADLs and to maintain quality of life.  Problem List Patient Active Problem List   Diagnosis Date Noted  . Primary osteoarthritis of left knee 02/09/2017  . SDH (subdural hematoma) (HCC)   . Urinary frequency   . Type 2 diabetes mellitus with peripheral neuropathy (HCC)   . Cough   . Gait disturbance, post-stroke 10/20/2016  . Hemiparesis affecting left side as late effect of stroke (HCC) 10/20/2016  . Essential hypertension 10/19/2016  . Hyperlipidemia LDL goal <70 10/19/2016  . 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  . Left hemiparesis (HCC)   . Atrial fibrillation (HCC)   . History of fall   . History of traumatic subdural hematoma   . Vascular headache   . Hypokalemia   . Leukocytosis   . Acute blood loss anemia   . Hypoalbuminemia due to protein-calorie malnutrition (HCC)   . Dysphagia, post-stroke   . Acute respiratory failure (HCC)   . Acute  embolic stroke (HCC) - R putamen/caudate and R insular infarcts s/p TICI3 revascularization w/ mechanical thrombectomy d/t AF not on [facility] 10/15/2016  . Pressure injury of skin 10/15/2016  . HCAP (healthcare-associated pneumonia) 11/09/2014  . Weakness 08/25/2014  . Acute bronchitis 08/25/2014  . UTI (lower urinary tract infection) 08/25/2014  . Fracture of lumbar spine (HCC) 08/25/2014  . Subdural hematoma, post-traumatic (HCC) 05/12/2014  . Chronic atrial fibrillation (HCC) 05/12/2014  . Diabetes mellitus (HCC) 05/12/2014  . OSA on CPAP 05/12/2014    RINE,KATHRYN 03/15/2017, 4:15 PM Keene Breath, OTR/L [phone] Phone: [phone] 4:18 PM 03/15/17 [facility] [address] [place], [place], [address] Phone: [phone]   Fax:  [phone]  Name: [name] MRN: [id number] Date of Birth: [birth year]

## 2017-03-20 ENCOUNTER — Ambulatory Visit: Payer: Medicare Other | Admitting: Occupational Therapy

## 2017-03-20 ENCOUNTER — Encounter: Payer: Self-pay | Admitting: Physical Therapy

## 2017-03-20 ENCOUNTER — Ambulatory Visit: Payer: Medicare Other | Attending: Physical Medicine & Rehabilitation | Admitting: Physical Therapy

## 2017-03-20 DIAGNOSIS — I69354 Hemiplegia and hemiparesis following cerebral infarction affecting left non-dominant side: Secondary | ICD-10-CM | POA: Insufficient documentation

## 2017-03-20 DIAGNOSIS — R2689 Other abnormalities of gait and mobility: Secondary | ICD-10-CM | POA: Insufficient documentation

## 2017-03-20 DIAGNOSIS — R2681 Unsteadiness on feet: Secondary | ICD-10-CM | POA: Diagnosis present

## 2017-03-20 DIAGNOSIS — I69315 Cognitive social or emotional deficit following cerebral infarction: Secondary | ICD-10-CM | POA: Diagnosis present

## 2017-03-20 DIAGNOSIS — R296 Repeated falls: Secondary | ICD-10-CM | POA: Insufficient documentation

## 2017-03-20 DIAGNOSIS — M6281 Muscle weakness (generalized): Secondary | ICD-10-CM | POA: Diagnosis not present

## 2017-03-20 DIAGNOSIS — R42 Dizziness and giddiness: Secondary | ICD-10-CM | POA: Insufficient documentation

## 2017-03-20 NOTE — Therapy (Signed)
[facility] [address] [place], [place], [address] Phone: [phone]   Fax:  [phone]  Physical Therapy Treatment  Patient Details  Name: [name] MRN: [id number] Date of Birth: 28-Dec-1940 Referring Provider: Alysia Penna, MD  Encounter Date: 03/20/2017      PT End of Session - 03/20/17 1944    Visit Number 11   Number of Visits 17   Date for PT Re-Evaluation 04/09/17   Authorization Type Medicare & G-codes with proress note every 10th visit    PT Start Time 1320   PT Stop Time 1400   PT Time Calculation (min) 40 min   Activity Tolerance Patient tolerated treatment well   Behavior During Therapy [facility] for tasks assessed/performed      Past Medical History:  Diagnosis Date  . Atrial fibrillation ([place])   . Chronic back pain    "mid back down into lower back" (08/25/2014)  . Frequent falls   . GERD (gastroesophageal reflux disease)   . Hypertriglyceridemia   . OSA on CPAP   . Osteoarthritis    "knees, hands, back" (08/25/2014)  . Pneumonia ~ 2000 X 1  . Subdural hematoma ([place]) july 2015   S/P fall while on Coumadin  . T12 compression fracture ([place]) 2012  . Type II diabetes mellitus ([place])     Past Surgical History:  Procedure Laterality Date  . APPENDECTOMY  2012  . CATARACT EXTRACTION W/ INTRAOCULAR LENS  IMPLANT, BILATERAL Bilateral 2000's  . IR GENERIC HISTORICAL  10/15/2016   IR PERCUTANEOUS ART THROMBECTOMY/INFUSION INTRACRANIAL INC DIAG ANGIO 10/15/2016 [name], MD [facility]  . IR GENERIC HISTORICAL  12/13/2016   IR RADIOLOGIST EVAL & MGMT 12/13/2016 [facility]  . RADIOLOGY WITH ANESTHESIA N/A 10/15/2016   Procedure: RADIOLOGY WITH ANESTHESIA;  Surgeon: [name], MD;  Location: [place];  Service: Radiology;  Laterality: N/A;  . TOTAL ABDOMINAL HYSTERECTOMY  1990    There were no vitals filed for this visit.      Subjective Assessment - 03/20/17 1312    Subjective  Reports they do not do the HEP given by HHPT anymore (some exercises hurt her back or her knee). She does still ride her stationary bike (sometimes) for up to 15 minutes and this does not hurt.    Patient is accompained by: Family member  daughter   Pertinent History falls with subdural hematoma, Afib, chronic back pain, T12 compression fx, type 2 DM, OA, PNA   Limitations Walking;House hold activities;Other (comment);Standing   Patient Stated Goals To treat her weak legs and walk free of AD   Currently in Pain? Yes   Pain Score 3    Pain Location Knee   Pain Orientation Left   Pain Descriptors / Indicators Aching   Pain Onset More than a month ago   Pain Frequency Intermittent                         [facility] Adult PT Treatment/Exercise - 03/20/17 1927      Transfers   Transfers Sit to Stand;Stand to [employer] to Stand 4: Min guard   Stand to Sit 4: Min Teacher, music for safety to stand to [facility]; to sit pt does not fully back up/finishing turning      Ambulation/Gait   Ambulation/Gait Assistance 4: Min guard   Ambulation/Gait Assistance Details vc for heelstrike and for proper sequencing with SBQC; without SBQC in //  bars (no UE support, but for safety)   Ambulation Distance (Feet) 120 Feet  120, plus // bar activities   Assistive device Small based quad cane;Parallel bars   Gait Pattern Step-through pattern;Decreased arm swing - left;Decreased stride length;Right foot flat;Left foot flat;Shuffle;Poor foot clearance - left;Poor foot clearance - right   Ambulation Surface Level     Knee/Hip Exercises: Machines for Strengthening   Other Machine leg press x 30# bil LEs 10 reps x 3 sets; pt denied knee pain and liked this exercise             Balance Exercises - 03/20/17 1934      Balance Exercises: Standing   SLS with Vectors Solid surface;Foam/compliant surface;Upper extremity assist 2;Upper extremity assist 1  single and crossed taps on foam  bubbles   Balance Beam black beam horizontal; EO no UE support <5 sec, 1 finger support x 30 sec; step off/on anterior, posterior   Tandem Gait Forward;Upper extremity support;2 reps   Retro Gait Upper extremity support;3 reps   Sidestepping Foam/compliant support;Upper extremity support  length red mat x 4   Step Over Hurdles / [place]es sideways 6" hurdles, light UE support at counter; forwards reported incr back pain   Other Standing Exercises alternating heel taps on target to emphasize heelstrike; targets placed for longer step length and incr heelstike           PT Education - 03/20/17 1943    Education provided Yes   Education Details to bring HEP sheets from [place] for review to eliminate/alter those that hurt her back or knee (bridge, forward lunges as described by daughter)   Person(s) Educated Child(ren);Patient   Methods Explanation   Comprehension Verbalized understanding          PT Short Term Goals - 03/10/17 1305      PT SHORT TERM GOAL #1   Title (TARGET DATE: 03/10/2017) Pt will participate in stair negotiation assessment and vestibular evaluation with goal to be set if needed.   Baseline Both assessed   Status Achieved     PT SHORT TERM GOAL #2   Title Patient decrease falls risk as indicated by a gait velocity of > or = 1.44f/s with SPC.  (TARGET DATE: 03/10/2017)    Baseline 1.238fsec with cane, 1.1565fec on 5/24   Status Not Met     PT SHORT TERM GOAL #3   Title Patient will demonstrate ability to ambulate 250 feet over level surfaces with SPC with 25% cues for increased foot clearance/step length and perform 180 deg turns to L and R with supervision overall. (TARGET DATE: 03/10/2017)    Baseline min A x 250' over level surfaces with quad cane, >75% cues   Time 4   Period Weeks   Status Partially Met     PT SHORT TERM GOAL #4   Title TUG will be performed and long term goal to be set. (TARGET DATE: 03/10/2017)    Baseline met 02/17/17   Time 4   Period  Weeks   Status Achieved     PT SHORT TERM GOAL #5   Title Pt will improve balance and decrease falls risk as indicated by improvement in BERG score to >or = 37/56   Baseline 30/56; 46/56 on 5/24   Status Achieved           PT Long Term Goals - 02/17/17 1233      PT LONG TERM GOAL #1   Title Patient will demonstrate independence with  HEP and verbalize options for community fitness program. (TARGET DATE: 04/07/2017)   Time 8   Period Weeks   Status New     PT LONG TERM GOAL #2   Title Patient will improve gait velocity to >2.0 ft/sec with SPC to indicate pt is safe for limited community ambulation. (TARGET DATE: 04/07/2017)    Time 8   Period Weeks   Status New     PT LONG TERM GOAL #3   Title Patient will score > or = 42/56 on the Berg Balance Test to indicate decreased falls risk. (TARGET DATE: 04/07/2017)    Time 8   Period Weeks   Status New     PT LONG TERM GOAL #4   Title Pt will decrease falls risk with sit <> stand and turning as indicated by TUG score of <18   Baseline 25   Time 8   Period Weeks   Status New     PT LONG TERM GOAL #5   Title Patient will ambulate 500 feet outside over uneven terrain including ramps/curbs with SPC and will negotiate 3 stairs with SPC, 1 rail with supervision for community ambulation. (TARGET DATE: 04/07/2017)   Time 8   Period Weeks   Status New     PT LONG TERM GOAL #6   Title Pt will improve LLE strength to 4/5 overall at hip, knee and ankle   Baseline 3+ hip flexion, 4- knee extension, 4 knee flexion, 3- ankle DF   Time 8   Period Weeks   Status New               Plan - 03/20/17 1948    Clinical Impression Statement Skilled session focused on balance, strengthening, pre-gait and gait training with focus on improved use of LLE. Patient able to progress to walking in // bars with no UE support and consistent heelstrike for length of bars (8 ft) x 1 (out of 6 trials, other trials with partially maintained heelstrike).  Patient continues to need near constant cuing to limit LLE external rotation during all activities. Will continue to benefit from PT to work towards [place].    Rehab Potential Good   Clinical Impairments Affecting Rehab Potential pain of chronic nature   PT Frequency 2x / week   PT Duration 8 weeks   PT Treatment/Interventions ADLs/Self Care Home Management;Canalith Repostioning;Electrical Stimulation;DME Instruction;Gait training;Stair training;Functional mobility training;Therapeutic activities;Therapeutic exercise;Balance training;Neuromuscular re-education;Cognitive remediation;Patient/family education;Orthotic Fit/Training;Energy conservation;Taping;Vestibular   PT Next Visit Plan if dtr brings HEP from HHPT, review and update--otherwise add to HEP for balance, strengthening; Treadmill, Standing balance, lateral weight shifting-especially to R, tandem stance with weight shifting, SLS, foot clearance during gait, liked leg press      Patient will benefit from skilled therapeutic intervention in order to improve the following deficits and impairments:  Abnormal gait, Decreased endurance, Decreased balance, Decreased cognition, Decreased mobility, Decreased strength, Difficulty walking, Dizziness, Impaired sensation, Pain  Visit Diagnosis: Muscle weakness (generalized)  Hemiplegia and hemiparesis following cerebral infarction affecting left non-dominant side (HCC)  Unsteadiness on feet  Other abnormalities of gait and mobility     Problem List Patient Active Problem List   Diagnosis Date Noted  . Primary osteoarthritis of left knee 02/09/2017  . SDH (subdural hematoma) ([place])   . Urinary frequency   . Type 2 diabetes mellitus with peripheral neuropathy (HCC)   . Cough   . Gait disturbance, post-stroke 10/20/2016  . Hemiparesis affecting left side as late effect of stroke ([place])  10/20/2016  . Essential hypertension 10/19/2016  . Hyperlipidemia LDL goal <70 10/19/2016  .  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[place]) 09/62/8366  . Left hemiparesis ([place])   . Atrial fibrillation ([place])   . History of fall   . History of traumatic subdural hematoma   . Vascular headache   . Hypokalemia   . Leukocytosis   . Acute blood loss anemia   . Hypoalbuminemia due to protein-calorie malnutrition ([place])   . Dysphagia, post-stroke   . Acute respiratory failure ([place])   . Acute embolic stroke (HCC) - R putamen/caudate and R insular infarcts s/p TICI3 revascularization w/ mechanical thrombectomy d/t AF not on [facility] 10/15/2016  . Pressure injury of skin 10/15/2016  . HCAP (healthcare-associated pneumonia) 11/09/2014  . Weakness 08/25/2014  . Acute bronchitis 08/25/2014  . UTI (lower urinary tract infection) 08/25/2014  . Fracture of lumbar spine ([place]) 08/25/2014  . Subdural hematoma, post-traumatic ([place]) 05/12/2014  . Chronic atrial fibrillation ([place]) 05/12/2014  . Diabetes mellitus ([place]) 05/12/2014  . OSA on CPAP 05/12/2014    Rexanne Mano, PT 03/20/2017, 8:00 PM  [facility] [address] [place], [place], [address] Phone: [phone]   Fax:  [phone]  Name: Anhthu Perdew MRN: [id number] Date of Birth: 1941-01-24

## 2017-03-20 NOTE — Therapy (Signed)
[facility] [address] [place], [place], [address] Phone: [phone]   Fax:  [phone]  Occupational Therapy Treatment  Patient Details  Name: [name] MRN: [id number] Date of Birth: [birth year] Referring Provider: Dr. Wynn Banker  Encounter Date: 03/20/2017      OT End of Session - 03/20/17 1323    Visit Number 11   Number of Visits 17   Date for OT Re-Evaluation 04/08/17   Authorization Type Medicare/ Medicaid   Authorization Time Period 60 days   Authorization - Visit Number 11   Authorization - Number of Visits 20   OT Start Time 1230   OT Stop Time 1315   OT Time Calculation (min) 45 min   Activity Tolerance Patient tolerated treatment well      Past Medical History:  Diagnosis Date  . Atrial fibrillation (HCC)   . Chronic back pain    "mid back down into lower back" (08/25/2014)  . Frequent falls   . GERD (gastroesophageal reflux disease)   . Hypertriglyceridemia   . OSA on CPAP   . Osteoarthritis    "knees, hands, back" (08/25/2014)  . Pneumonia ~ 2000 X 1  . Subdural hematoma (HCC) july 2015   S/P fall while on Coumadin  . T12 compression fracture (HCC) 2012  . Type II diabetes mellitus (HCC)     Past Surgical History:  Procedure Laterality Date  . APPENDECTOMY  2012  . CATARACT EXTRACTION W/ INTRAOCULAR LENS  IMPLANT, BILATERAL Bilateral 2000's  . IR GENERIC HISTORICAL  10/15/2016   IR PERCUTANEOUS ART THROMBECTOMY/INFUSION INTRACRANIAL INC DIAG ANGIO 10/15/2016 [name], MD [facility]  . IR GENERIC HISTORICAL  12/13/2016   IR RADIOLOGIST EVAL & MGMT 12/13/2016 [facility]  . RADIOLOGY WITH ANESTHESIA N/A 10/15/2016   Procedure: RADIOLOGY WITH ANESTHESIA;  Surgeon: [name], MD;  Location: [facility] OR;  Service: Radiology;  Laterality: N/A;  . TOTAL ABDOMINAL HYSTERECTOMY  1990    There were no vitals filed for this visit.      Subjective Assessment - 03/20/17 1236    Pertinent History R MCA CVA, hx of fall with subdural hematoma, A-fib, PNA   Patient Stated Goals return to cooking   Currently in Pain? Yes   Pain Score 3    Pain Location Knee  O.T. not directly addressing   Pain Orientation Left                      OT Treatments/Exercises (OP) - 03/20/17 0001      ADLs   ADL Comments Discussed with daughter and primary P.T. whether pt would be using walker or cane more in the home. P.T. thinks she is safe enough to use cane in kitchen, and therefore O.T.  recommended practicing safety and problem solving with use of cane to pt/daughter. This therapist feels walker requires more sequencing and problem solving for safety strategies that pt has difficulty implementing. Also switching b/t cane and walker may confuse pt more. Pt's daughter provided picture of kitchen and we problem solved getting things in/out of refrigerator - to practice with patient next time.      Exercises   Exercises --  UBE x 8 min. for conditioning/endurance      Therapeutic activity: dynamic standing with RUE disengaged on block, to incr. weight shift on LLE, while tabletop scanning to get blocks by color with gripper Lt hand (level 1 resistance) and placing in bowl on Lt side. Entire activity  working on dynamic standing, wt shifts, tabletop scanning, grip strength, and attention to Lt side. Pt had max difficulty and max drops of blocks with gripper Lt hand (partly d/t decr. grip strength, partly d/t decr. awareness/attention). Adapted gripper for greater ease, but pt still required multiple rest breaks Lt hand, and had mod drops.             OT Short Term Goals - 03/14/17 1613      OT SHORT TERM GOAL #1   Title Pt/ caregiver will be I with HEP. due 03/09/17   Time 4   Period Weeks   Status Achieved     OT SHORT TERM GOAL #2   Title Pt will perform simple cooking/ home management in standing x 25 mins without rest break or LOB.   Time 4   Period Weeks    Status Achieved  Pt still requires close supervision     OT SHORT TERM GOAL #3   Title Pt/ caregiver will verbalize understanding of compensatory stategies for short term memory deficits.   Time 4   Period Weeks   Status Achieved     OT SHORT TERM GOAL #4   Title Test standing functional reach and set goal prn   Status Deferred     OT SHORT TERM GOAL #5   Title Pt will demonstrate improved fine motor coordination for ADLS as evidenced by decreasing LUE 9 hole peg test score to 29 secs or less.   Baseline RUE 24.50 secs LUE 32.75 secs   Time 4   Period Weeks   Status On-going  36.18 secs           OT Long Term Goals - 02/08/17 1440      OT LONG TERM GOAL #1   Title Pt will perfom mod complex cooking/ home management in standing x 45 mins without rest break or LOB. due 04/08/17   Time 8   Period Weeks   Status New     OT LONG TERM GOAL #2   Title Pt will increase bilateral grip strength to 25 lbs or greater for increased functional use for ADLs.   Time 8   Period Weeks   Status New     OT LONG TERM GOAL #3   Title Pt will demonstrate ability to retrieve 2 lbs from overhead shelf at 125 without drops with RUE   Baseline 120   Time 8   Period Weeks   Status New     OT LONG TERM GOAL #4   Title Pt will demonstrate ability to retrieve 2 lbs item from ovehead shelf at 120 shoulder flexion with LUE   Baseline 110   Time 8   Period Weeks   Status New               Plan - 03/20/17 1323    Clinical Impression Statement Pt progressing with functional standing balance. Pt still requires reminders to attend to Lt side and for general safety strategies.    Rehab Potential Fair   OT Frequency 2x / week   OT Duration 8 weeks   OT Treatment/Interventions Self-care/ADL training;Moist Heat;Fluidtherapy;DME and/or AE instruction;Splinting;Patient/family education;Balance training;Therapeutic exercises;Ultrasound;Therapeutic exercise;Therapeutic activities;Cognitive  remediation/compensation;Passive range of motion;Functional Mobility Training;Neuromuscular education;Cryotherapy;Electrical Stimulation;Parrafin;Energy conservation;Manual Therapy   Plan practice simulating kitchen activity - getting things in/out of refrigerator and safety/practice with wt. shifts needed for this (refer to picture of pt's home set up). Continue to work on grip strength and attention Lt hand  Patient will benefit from skilled therapeutic intervention in order to improve the following deficits and impairments:  Abnormal gait, Decreased cognition, Decreased knowledge of use of DME, Pain, Impaired sensation, Decreased mobility, Decreased coordination, Decreased activity tolerance, Decreased endurance, Decreased range of motion, Decreased strength, Impaired UE functional use, Impaired perceived functional ability, Difficulty walking, Decreased safety awareness, Decreased knowledge of precautions, Decreased balance  Visit Diagnosis: Unsteadiness on feet  Hemiplegia and hemiparesis following cerebral infarction affecting left non-dominant side [facility])    Problem List Patient Active Problem List   Diagnosis Date Noted  . Primary osteoarthritis of left knee 02/09/2017  . SDH (subdural hematoma) (HCC)   . Urinary frequency   . Type 2 diabetes mellitus with peripheral neuropathy (HCC)   . Cough   . Gait disturbance, post-stroke 10/20/2016  . Hemiparesis affecting left side as late effect of stroke (HCC) 10/20/2016  . Essential hypertension 10/19/2016  . Hyperlipidemia LDL goal <70 10/19/2016  . 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  . Left hemiparesis (HCC)   . Atrial fibrillation (HCC)   . History of fall   . History of traumatic subdural hematoma   . Vascular headache   . Hypokalemia   . Leukocytosis   . Acute blood loss anemia   . Hypoalbuminemia due to protein-calorie malnutrition (HCC)   . Dysphagia, post-stroke   . Acute respiratory failure (HCC)   . Acute  embolic stroke (HCC) - R putamen/caudate and R insular infarcts s/p TICI3 revascularization w/ mechanical thrombectomy d/t AF not on [facility] 10/15/2016  . Pressure injury of skin 10/15/2016  . HCAP (healthcare-associated pneumonia) 11/09/2014  . Weakness 08/25/2014  . Acute bronchitis 08/25/2014  . UTI (lower urinary tract infection) 08/25/2014  . Fracture of lumbar spine (HCC) 08/25/2014  . Subdural hematoma, post-traumatic (HCC) 05/12/2014  . Chronic atrial fibrillation (HCC) 05/12/2014  . Diabetes mellitus (HCC) 05/12/2014  . OSA on CPAP 05/12/2014    Kelli Churn, OTR/L 03/20/2017, 1:26 PM   [facility] [address] [place], [place], [address] Phone: [phone]   Fax:  [phone]  Name: Jolanta Cabeza MRN: [id number] Date of Birth: 03-23-1941

## 2017-03-24 ENCOUNTER — Ambulatory Visit (HOSPITAL_BASED_OUTPATIENT_CLINIC_OR_DEPARTMENT_OTHER): Payer: Medicare Other | Admitting: Physical Medicine & Rehabilitation

## 2017-03-24 ENCOUNTER — Encounter: Payer: Self-pay | Admitting: Physical Medicine & Rehabilitation

## 2017-03-24 ENCOUNTER — Ambulatory Visit: Payer: Medicare Other | Admitting: Occupational Therapy

## 2017-03-24 ENCOUNTER — Encounter: Payer: Medicare Other | Attending: Physical Medicine & Rehabilitation

## 2017-03-24 ENCOUNTER — Encounter: Payer: Self-pay | Admitting: Physical Therapy

## 2017-03-24 ENCOUNTER — Ambulatory Visit: Payer: Medicare Other | Admitting: Physical Therapy

## 2017-03-24 VITALS — BP 102/71 | HR 77

## 2017-03-24 DIAGNOSIS — M6281 Muscle weakness (generalized): Secondary | ICD-10-CM

## 2017-03-24 DIAGNOSIS — G8929 Other chronic pain: Secondary | ICD-10-CM | POA: Diagnosis not present

## 2017-03-24 DIAGNOSIS — Z7722 Contact with and (suspected) exposure to environmental tobacco smoke (acute) (chronic): Secondary | ICD-10-CM | POA: Diagnosis not present

## 2017-03-24 DIAGNOSIS — K219 Gastro-esophageal reflux disease without esophagitis: Secondary | ICD-10-CM | POA: Diagnosis not present

## 2017-03-24 DIAGNOSIS — Z9889 Other specified postprocedural states: Secondary | ICD-10-CM | POA: Diagnosis not present

## 2017-03-24 DIAGNOSIS — E1142 Type 2 diabetes mellitus with diabetic polyneuropathy: Secondary | ICD-10-CM | POA: Insufficient documentation

## 2017-03-24 DIAGNOSIS — R296 Repeated falls: Secondary | ICD-10-CM

## 2017-03-24 DIAGNOSIS — Z90710 Acquired absence of both cervix and uterus: Secondary | ICD-10-CM | POA: Diagnosis not present

## 2017-03-24 DIAGNOSIS — Z7984 Long term (current) use of oral hypoglycemic drugs: Secondary | ICD-10-CM | POA: Diagnosis not present

## 2017-03-24 DIAGNOSIS — I69354 Hemiplegia and hemiparesis following cerebral infarction affecting left non-dominant side: Secondary | ICD-10-CM

## 2017-03-24 DIAGNOSIS — R2681 Unsteadiness on feet: Secondary | ICD-10-CM

## 2017-03-24 DIAGNOSIS — G4733 Obstructive sleep apnea (adult) (pediatric): Secondary | ICD-10-CM | POA: Insufficient documentation

## 2017-03-24 DIAGNOSIS — R2689 Other abnormalities of gait and mobility: Secondary | ICD-10-CM

## 2017-03-24 DIAGNOSIS — I639 Cerebral infarction, unspecified: Secondary | ICD-10-CM | POA: Diagnosis not present

## 2017-03-24 DIAGNOSIS — M1712 Unilateral primary osteoarthritis, left knee: Secondary | ICD-10-CM

## 2017-03-24 DIAGNOSIS — I4891 Unspecified atrial fibrillation: Secondary | ICD-10-CM | POA: Diagnosis not present

## 2017-03-24 DIAGNOSIS — R42 Dizziness and giddiness: Secondary | ICD-10-CM

## 2017-03-24 DIAGNOSIS — I69315 Cognitive social or emotional deficit following cerebral infarction: Secondary | ICD-10-CM

## 2017-03-24 DIAGNOSIS — M170 Bilateral primary osteoarthritis of knee: Secondary | ICD-10-CM | POA: Insufficient documentation

## 2017-03-24 NOTE — Progress Notes (Signed)
Subjective:    Patient ID: [name], female    DOB: 12/08/1940, 76 y.o.   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 Making good progress. Using knee brace to alleviate pain as well as stabilize the knee.  Pain Inventory Average Pain 4 Pain Right Now 3 My pain is dull and aching  In the last 24 hours, has pain interfered with the following? General activity 3 Relation with others 4 Enjoyment of life 4 What TIME of day is your pain at its worst? daytime Sleep (in general) Fair  Pain is worse with: walking and sitting Pain improves with: rest, therapy/exercise and medication Relief from Meds: 8  Mobility walk with assistance use a cane use a walker ability to climb steps?  yes  Function retired  Neuro/Psych bladder control problems weakness numbness trouble walking spasms dizziness loss of taste or smell  Prior Studies Any changes since last visit?  yes  Physicians involved in your care Any changes since last visit?  yes   Family History  Problem Relation Age of Onset  . Family history unknown: Yes   Social History   Social History  . Marital status: Widowed    Spouse name: N/A  . Number of children: 2  . Years of education: 2   Occupational History  .      retired   Social History Main Topics  . Smoking status: Passive Smoke Exposure - Never Smoker  . Smokeless tobacco: Never Used  . Alcohol use No  . Drug use: No  . Sexual activity: No   Other Topics Concern  . Not on file   Social History Narrative   Lives w/daughter and grandchildren   caffeine coffee, 1/2 cup daily   Past Surgical History:  Procedure Laterality Date  . APPENDECTOMY  2012  . CATARACT EXTRACTION W/ INTRAOCULAR LENS  IMPLANT, BILATERAL Bilateral 2000's  . IR GENERIC HISTORICAL  10/15/2016   IR PERCUTANEOUS ART THROMBECTOMY/INFUSION  INTRACRANIAL INC DIAG ANGIO 10/15/2016 Julieanne CottonSanjeev Deveshwar, MD [facility]  . IR GENERIC HISTORICAL  12/13/2016   IR RADIOLOGIST EVAL & MGMT 12/13/2016 [facility]  . RADIOLOGY WITH ANESTHESIA N/A 10/15/2016   Procedure: RADIOLOGY WITH ANESTHESIA;  Surgeon: Julieanne CottonSanjeev Deveshwar, MD;  Location: [facility] OR;  Service: Radiology;  Laterality: N/A;  . TOTAL ABDOMINAL HYSTERECTOMY  1990   Past Medical History:  Diagnosis Date  . Atrial fibrillation (HCC)   . Chronic back pain    "mid back down into lower back" (08/25/2014)  . Frequent falls   . GERD (gastroesophageal reflux disease)   . Hypertriglyceridemia   . OSA on CPAP   . Osteoarthritis    "knees, hands, back" (08/25/2014)  . Pneumonia ~ 2000 X 1  . Subdural hematoma (HCC) july 2015   S/P fall while on Coumadin  . T12 compression fracture (HCC) 2012  . Type II diabetes mellitus (HCC)    There were no vitals taken for this visit.  Opioid Risk Score:   Fall Risk Score:  `1  Depression screen PHQ 2/9  Depression screen PHQ 2/9 11/08/2016  Decreased Interest 3  Down, Depressed, Hopeless 1  PHQ - 2 Score 4  Altered sleeping 3  Tired, decreased energy 3  Change in appetite 0  Feeling bad or failure about yourself  1  Trouble concentrating 1  Moving slowly or fidgety/restless 3  Suicidal thoughts 0  PHQ-9  Score 15  Difficult doing work/chores Somewhat difficult    Review of Systems  Constitutional: Negative.   HENT: Negative.   Eyes: Negative.   Respiratory: Positive for shortness of breath.   Cardiovascular: Negative.   Gastrointestinal: Negative.   Endocrine: Negative.   Genitourinary: Negative.   Musculoskeletal: Positive for joint swelling.  Skin: Negative.   Allergic/Immunologic: Negative.   Neurological: Negative.   Hematological: Negative.   Psychiatric/Behavioral: Negative.   All other systems reviewed and are negative.      Objective:   Physical Exam  Constitutional: She is oriented to person, place, and time.  She appears well-developed and well-nourished.  HENT:  Head: Normocephalic and atraumatic.  Eyes: Conjunctivae and EOM are normal. Pupils are equal, round, and reactive to light.  Neurological: She is alert and oriented to person, place, and time.  Skin: Skin is warm and dry.  Nursing note and vitals reviewed.  Motor strength is 4 plus in the left deltoid, biceps, triceps, grip, hip flexor, knee extensor, ankle dorsiflexor 5/5 on the right side. Ambulates with a left knee orthosis plus quad cane.       Assessment & Plan:  1. Right MCA infarct with left hemiparesis, still making some improvements in therapy. Continue outpatient PT, OT 2. Left knee osteoarthritis with some improvements after Synvisc injection. Will repeat in September, if her pain starts recurring. Continue knee orthosis when walking

## 2017-03-24 NOTE — Therapy (Signed)
[facility] [address] [place], [place], [address] Phone: [phone]   Fax:  [phone]  Occupational Therapy Treatment  Patient Details  Name: [name] MRN: [id number] Date of Birth: 03/18/1941 Referring Provider: Dr. Wynn BankerKirsteins  Encounter Date: 03/24/2017      OT End of Session - 03/24/17 1551    Visit Number 12   Number of Visits 17   Date for OT Re-Evaluation 04/08/17  Therapsit to see pt for last visit on 04/11/17 due to scheduling conflicts   Authorization Type Medicare/ Medicaid   Authorization Time Period 60 days   Authorization - Visit Number 12   Authorization - Number of Visits 20   OT Start Time 1450   OT Stop Time 1530   OT Time Calculation (min) 40 min   Activity Tolerance Patient tolerated treatment well   Behavior During Therapy [facility] for tasks assessed/performed      Past Medical History:  Diagnosis Date  . Atrial fibrillation (HCC)   . Chronic back pain    "mid back down into lower back" (08/25/2014)  . Frequent falls   . GERD (gastroesophageal reflux disease)   . Hypertriglyceridemia   . OSA on CPAP   . Osteoarthritis    "knees, hands, back" (08/25/2014)  . Pneumonia ~ 2000 X 1  . Subdural hematoma (HCC) july 2015   S/P fall while on Coumadin  . T12 compression fracture (HCC) 2012  . Type II diabetes mellitus (HCC)     Past Surgical History:  Procedure Laterality Date  . APPENDECTOMY  2012  . CATARACT EXTRACTION W/ INTRAOCULAR LENS  IMPLANT, BILATERAL Bilateral 2000's  . IR GENERIC HISTORICAL  10/15/2016   IR PERCUTANEOUS ART THROMBECTOMY/INFUSION INTRACRANIAL INC DIAG ANGIO 10/15/2016 Julieanne CottonSanjeev Deveshwar, MD [facility]  . IR GENERIC HISTORICAL  12/13/2016   IR RADIOLOGIST EVAL & MGMT 12/13/2016 [facility]  . RADIOLOGY WITH ANESTHESIA N/A 10/15/2016   Procedure: RADIOLOGY WITH ANESTHESIA;  Surgeon: Julieanne CottonSanjeev Deveshwar, MD;  Location: [facility] OR;  Service: Radiology;  Laterality: N/A;  .  TOTAL ABDOMINAL HYSTERECTOMY  1990    There were no vitals filed for this visit.      Subjective Assessment - 03/24/17 1550    Subjective  Pt's dtr reports pt continues to need cueing for safety at home   Pertinent History R MCA CVA, hx of fall with subdural hematoma, A-fib, PNA   Patient Stated Goals return to cooking   Currently in Pain? Yes   Pain Score 3    Pain Location Knee   Pain Orientation Left   Pain Descriptors / Indicators Aching   Pain Type Chronic pain   Pain Onset More than a month ago   Pain Frequency Intermittent   Aggravating Factors  standing    Pain Relieving Factors lying down   Multiple Pain Sites No              Treatment: Explored options for safe retrieval and transport of [place]. Pt was unsafe using the cane as she switched cane to left hand while carryying something in Right hand and pt continued to drag left foot.  Pt also practiced retrieving items using walker with tray and kitchen cart. therapist provided min-mod v.c fordafety Pt was safest using kitchen cart due to the smooth motion of the swivel wheels. Anticipate pt will need to use a walker with tray or rollator in the kitchen for increased safety with transport. PT is going to try rollator  with pt today. Ball exercises in seated for shoulder flexion and chest press, however pt reports shoulder pain after task and therefore task was discontinued. Therapist recommends supine ball ex at home.                  OT Short Term Goals - 03/14/17 1613      OT SHORT TERM GOAL #1   Title Pt/ caregiver will be I with HEP. due 03/09/17   Time 4   Period Weeks   Status Achieved     OT SHORT TERM GOAL #2   Title Pt will perform simple cooking/ home management in standing x 25 mins without rest break or LOB.   Time 4   Period Weeks   Status Achieved  Pt still requires close supervision     OT SHORT TERM GOAL #3   Title Pt/ caregiver will verbalize understanding of  compensatory stategies for short term memory deficits.   Time 4   Period Weeks   Status Achieved     OT SHORT TERM GOAL #4   Title Test standing functional reach and set goal prn   Status Deferred     OT SHORT TERM GOAL #5   Title Pt will demonstrate improved fine motor coordination for ADLS as evidenced by decreasing LUE 9 hole peg test score to 29 secs or less.   Baseline RUE 24.50 secs LUE 32.75 secs   Time 4   Period Weeks   Status On-going  36.18 secs           OT Long Term Goals - 02/08/17 1440      OT LONG TERM GOAL #1   Title Pt will perfom mod complex cooking/ home management in standing x 45 mins without rest break or LOB. due 04/08/17   Time 8   Period Weeks   Status New     OT LONG TERM GOAL #2   Title Pt will increase bilateral grip strength to 25 lbs or greater for increased functional use for ADLs.   Time 8   Period Weeks   Status New     OT LONG TERM GOAL #3   Title Pt will demonstrate ability to retrieve 2 lbs from overhead shelf at 125 without drops with RUE   Baseline 120   Time 8   Period Weeks   Status New     OT LONG TERM GOAL #4   Title Pt will demonstrate ability to retrieve 2 lbs item from ovehead shelf at 120 shoulder flexion with LUE   Baseline 110   Time 8   Period Weeks   Status New               Plan - 03/24/17 1553    Clinical Impression Statement Pt is progressing towards goals yet she remains limited by decreased safety and left side awareness.   Rehab Potential Fair   OT Frequency 2x / week   OT Duration 8 weeks   Plan focus on UE A/ROM/ coordination, review supine ball ex, functional reach with LUE in painfree ROM, grip strength LUE   OT Home Exercise Plan ball in supine, putty ex   Consulted and Agree with Plan of Care Patient;Family member/caregiver      Patient will benefit from skilled therapeutic intervention in order to improve the following deficits and impairments:  Abnormal gait, Decreased cognition,  Decreased knowledge of use of DME, Pain, Impaired sensation, Decreased mobility, Decreased coordination, Decreased activity tolerance, Decreased endurance,  Decreased range of motion, Decreased strength, Impaired UE functional use, Impaired perceived functional ability, Difficulty walking, Decreased safety awareness, Decreased knowledge of precautions, Decreased balance  Visit Diagnosis: Hemiplegia and hemiparesis following cerebral infarction affecting left non-dominant side (HCC)  Unsteadiness on feet  Muscle weakness (generalized)  Other abnormalities of gait and mobility  Cognitive social or emotional deficit following cerebral infarction    Problem List Patient Active Problem List   Diagnosis Date Noted  . Primary osteoarthritis of left knee 02/09/2017  . SDH (subdural hematoma) (HCC)   . Urinary frequency   . Type 2 diabetes mellitus with peripheral neuropathy (HCC)   . Cough   . Gait disturbance, post-stroke 10/20/2016  . Hemiparesis affecting left side as late effect of stroke (HCC) 10/20/2016  . Essential hypertension 10/19/2016  . Hyperlipidemia LDL goal <70 10/19/2016  . 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  . Left hemiparesis (HCC)   . Atrial fibrillation (HCC)   . History of fall   . History of traumatic subdural hematoma   . Vascular headache   . Hypokalemia   . Leukocytosis   . Acute blood loss anemia   . Hypoalbuminemia due to protein-calorie malnutrition (HCC)   . Dysphagia, post-stroke   . Acute respiratory failure (HCC)   . Acute embolic stroke (HCC) - R putamen/caudate and R insular infarcts s/p TICI3 revascularization w/ mechanical thrombectomy d/t AF not on [facility] 10/15/2016  . Pressure injury of skin 10/15/2016  . HCAP (healthcare-associated pneumonia) 11/09/2014  . Weakness 08/25/2014  . Acute bronchitis 08/25/2014  . UTI (lower urinary tract infection) 08/25/2014  . Fracture of lumbar spine (HCC) 08/25/2014  . Subdural hematoma, post-traumatic (HCC)  05/12/2014  . Chronic atrial fibrillation (HCC) 05/12/2014  . Diabetes mellitus (HCC) 05/12/2014  . OSA on CPAP 05/12/2014    [name] 03/24/2017, 3:55 PM   [facility] [address] [place], [place], [address] Phone: [phone]   Fax:  [phone]  Name: [name] MRN: [id number] Date of Birth: 1941/01/09

## 2017-03-24 NOTE — Therapy (Signed)
[facility] [address] [place], [place], [address] Phone: [phone]   Fax:  [phone]  Physical Therapy Treatment  Patient Details  Name: [name] MRN: [id number] Date of Birth: Jan 30, 1941 Referring Provider: Alysia Penna, MD  Encounter Date: 03/24/2017      PT End of Session - 03/24/17 1802    Visit Number 12   Number of Visits 17   Date for PT Re-Evaluation 04/09/17  re-assess on 6/20   Authorization Type Medicare & G-codes with proress note every 10th visit    PT Start Time 1535   PT Stop Time 1620   PT Time Calculation (min) 45 min   Activity Tolerance Patient tolerated treatment well   Behavior During Therapy [facility] for tasks assessed/performed      Past Medical History:  Diagnosis Date  . Atrial fibrillation ([place])   . Chronic back pain    "mid back down into lower back" (08/25/2014)  . Frequent falls   . GERD (gastroesophageal reflux disease)   . Hypertriglyceridemia   . OSA on CPAP   . Osteoarthritis    "knees, hands, back" (08/25/2014)  . Pneumonia ~ 2000 X 1  . Subdural hematoma ([place]) july 2015   S/P fall while on Coumadin  . T12 compression fracture ([place]) 2012  . Type II diabetes mellitus ([place])     Past Surgical History:  Procedure Laterality Date  . APPENDECTOMY  2012  . CATARACT EXTRACTION W/ INTRAOCULAR LENS  IMPLANT, BILATERAL Bilateral 2000's  . IR GENERIC HISTORICAL  10/15/2016   IR PERCUTANEOUS ART THROMBECTOMY/INFUSION INTRACRANIAL INC DIAG ANGIO 10/15/2016 [name], MD [facility]-INTERV RAD  . IR GENERIC HISTORICAL  12/13/2016   IR RADIOLOGIST EVAL & MGMT 12/13/2016 [facility]INTER[facility] RAD  . RADIOLOGY WITH ANESTHESIA N/A 10/15/2016   Procedure: RADIOLOGY WITH ANESTHESIA;  Surgeon: [name], MD;  Location: [facility];  Service: Radiology;  Laterality: N/A;  . TOTAL ABDOMINAL HYSTERECTOMY  1990    There were no vitals filed for this visit.      Subjective Assessment - 03/24/17  1753    Subjective Having discussion with OT regarding appropriate AD to use in kitchen and planning next few visits to allow pt to participate in pelvic floor PT assessment on 19th and OT final assessment.  No issues to report.     Patient is accompained by: Family member   Pertinent History falls with subdural hematoma, Afib, chronic back pain, T12 compression fx, type 2 DM, OA, PNA   Limitations Walking;House hold activities;Other (comment);Standing   Patient Stated Goals To treat her weak legs and walk free of AD   Currently in Pain? Yes   Pain Score 3    Pain Location Back   Pain Orientation Left;Right   Pain Descriptors / Indicators Sore   Pain Type Chronic pain                         [facility] Adult PT Treatment/Exercise - 03/24/17 1755      Transfers   Stand to Sit 5: Supervision;With upper extremity assist;Other (comment)  safe sit <> stand with rollator, locking brakes     Ambulation/Gait   Ambulation/Gait Yes   Ambulation/Gait Assistance 4: Min guard;5: Supervision   Ambulation/Gait Assistance Details verbal cues for safety, sequence, positioning of rollator in kitchen when retrieving items from fridge, placing them in microwave and transporting from table <> sink   Assistive device Rollator   Gait Pattern Step-through pattern;Decreased  step length - right;Decreased step length - left;Decreased stride length;Decreased dorsiflexion - right;Decreased dorsiflexion - left;Decreased weight shift to right;Decreased weight shift to left;Decreased trunk rotation;Trunk flexed;Poor foot clearance - left;Poor foot clearance - right   Ambulation Surface Level;Indoor   Ramp 4: Min assist   Ramp Details (indicate cue type and reason) with rollator up/down with verbal cues for safety, posture and technique with use of brakes     Self-Care   Self-Care Posture;Other Self-Care Comments   Posture peformed supine stretch of upper thoracic and anterior chest lying on towel roll  and performing scapular retractions due to rounded posture and low back mm tightness/pain.  Discussed posture in sitting and standing   Other Self-Care Comments  Long discussion with pt, daughter and OT regarding safest option for AD in kitchen due to pt will have to fix herself meals when daughter is not home and has to carry items around in kitchen.  Discussed rollator, features of rollator, how to obtain rollator and proper/safe use of rollator in the home and community.                  PT Education - 03/24/17 1801    Education provided Yes   Education Details safe use of rollator, how to obtain rollator.  Reinforced to daughter to bring [facility] exercise sheets to observe exercises and determine which are appropriate or not, stretch for anterior chest and upper back   Person(s) Educated Patient;Child(ren)   Methods Explanation;Demonstration   Comprehension Verbalized understanding;Returned demonstration          PT Short Term Goals - 03/10/17 1305      PT SHORT TERM GOAL #1   Title (TARGET DATE: 03/10/2017) Pt will participate in stair negotiation assessment and vestibular evaluation with goal to be set if needed.   Baseline Both assessed   Status Achieved     PT SHORT TERM GOAL #2   Title Patient decrease falls risk as indicated by a gait velocity of > or = 1.7f/s with SPC.  (TARGET DATE: 03/10/2017)    Baseline 1.233fsec with cane, 1.1551fec on 5/24   Status Not Met     PT SHORT TERM GOAL #3   Title Patient will demonstrate ability to ambulate 250 feet over level surfaces with SPC with 25% cues for increased foot clearance/step length and perform 180 deg turns to L and R with supervision overall. (TARGET DATE: 03/10/2017)    Baseline min A x 250' over level surfaces with quad cane, >75% cues   Time 4   Period Weeks   Status Partially Met     PT SHORT TERM GOAL #4   Title TUG will be performed and long term goal to be set. (TARGET DATE: 03/10/2017)    Baseline met 02/17/17    Time 4   Period Weeks   Status Achieved     PT SHORT TERM GOAL #5   Title Pt will improve balance and decrease falls risk as indicated by improvement in BERG score to >or = 37/56   Baseline 30/56; 46/56 on 5/24   Status Achieved           PT Long Term Goals - 02/17/17 1233      PT LONG TERM GOAL #1   Title Patient will demonstrate independence with HEP and verbalize options for community fitness program. (TARGET DATE: 04/07/2017)   Time 8   Period Weeks   Status New     PT LONG TERM GOAL #2  Title Patient will improve gait velocity to >2.0 ft/sec with SPC to indicate pt is safe for limited community ambulation. (TARGET DATE: 04/07/2017)    Time 8   Period Weeks   Status New     PT LONG TERM GOAL #3   Title Patient will score > or = 42/56 on the Berg Balance Test to indicate decreased falls risk. (TARGET DATE: 04/07/2017)    Time 8   Period Weeks   Status New     PT LONG TERM GOAL #4   Title Pt will decrease falls risk with sit <> stand and turning as indicated by TUG score of <18   Baseline 25   Time 8   Period Weeks   Status New     PT LONG TERM GOAL #5   Title Patient will ambulate 500 feet outside over uneven terrain including ramps/curbs with SPC and will negotiate 3 stairs with SPC, 1 rail with supervision for community ambulation. (TARGET DATE: 04/07/2017)   Time 8   Period Weeks   Status New     PT LONG TERM GOAL #6   Title Pt will improve LLE strength to 4/5 overall at hip, knee and ankle   Baseline 3+ hip flexion, 4- knee extension, 4 knee flexion, 3- ankle DF   Time 8   Period Weeks   Status New               Plan - 03/24/17 1802    Clinical Impression Statement Treatment session focused on reinforcement of OT recommendations for use of rollator with seat/tray for safer mobility in the home when alone; performed safety and gait training in kitchen and on inclines; also performed safe sit <> stand training with rollator.  Continued discussion  about HEP and reinforced to daughter to bring in old exercises for PT to modify.  Provided pt and daughter with stretch to assist with posture and back pain.  Will continue to address.     Rehab Potential Good   Clinical Impairments Affecting Rehab Potential pain of chronic nature   PT Frequency 2x / week   PT Duration 8 weeks   PT Treatment/Interventions ADLs/Self Care Home Management;Canalith Repostioning;Electrical Stimulation;DME Instruction;Gait training;Stair training;Functional mobility training;Therapeutic activities;Therapeutic exercise;Balance training;Neuromuscular re-education;Cognitive remediation;Patient/family education;Orthotic Fit/Training;Energy conservation;Taping;Vestibular   PT Next Visit Plan if dtr brings HEP from HHPT, review and update--otherwise add to HEP for balance, strengthening; gait training with rollator; Treadmill, Standing balance, lateral weight shifting-especially to R, tandem stance with weight shifting, SLS, foot clearance during gait   Recommended Other Services purchase rollator   Consulted and Agree with Plan of Care Patient;Family member/caregiver   Family Member Consulted daughter      Patient will benefit from skilled therapeutic intervention in order to improve the following deficits and impairments:  Abnormal gait, Decreased endurance, Decreased balance, Decreased cognition, Decreased mobility, Decreased strength, Difficulty walking, Dizziness, Impaired sensation, Pain  Visit Diagnosis: Muscle weakness (generalized)  Unsteadiness on feet  Other abnormalities of gait and mobility  Hemiplegia and hemiparesis following cerebral infarction affecting left non-dominant side (HCC)  Repeated falls  Dizziness and giddiness     Problem List Patient Active Problem List   Diagnosis Date Noted  . Primary osteoarthritis of left knee 02/09/2017  . SDH (subdural hematoma) ([place])   . Urinary frequency   . Type 2 diabetes mellitus with peripheral  neuropathy (HCC)   . Cough   . Gait disturbance, post-stroke 10/20/2016  . Hemiparesis affecting left side as late effect of  stroke ([place]) 10/20/2016  . Essential hypertension 10/19/2016  . Hyperlipidemia LDL goal <70 10/19/2016  . 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 Left hemiparesis ([place])   . Atrial fibrillation ([place])   . History of fall   . History of traumatic subdural hematoma   . Vascular headache   . Hypokalemia   . Leukocytosis   . Acute blood loss anemia   . Hypoalbuminemia due to protein-calorie malnutrition ([place])   . Dysphagia, post-stroke   . Acute respiratory failure ([place])   . Acute embolic stroke (HCC) - R putamen/caudate and R insular infarcts s/p TICI3 revascularization w/ mechanical thrombectomy d/t AF not on [facility] 10/15/2016  . Pressure injury of skin 10/15/2016  . HCAP (healthcare-associated pneumonia) 11/09/2014  . Weakness 08/25/2014  . Acute bronchitis 08/25/2014  . UTI (lower urinary tract infection) 08/25/2014  . Fracture of lumbar spine ([place]) 08/25/2014  . Subdural hematoma, post-traumatic ([place]) 05/12/2014  . Chronic atrial fibrillation ([place]) 05/12/2014  . Diabetes mellitus ([place]) 05/12/2014  . OSA on CPAP 05/12/2014   Raylene Everts, PT, DPT 03/24/17    6:08 PM    [facility] [address] [place], [place], [address] Phone: [phone]   Fax:  [phone]  Name: [name] MRN: [id number] Date of Birth: 11/22/1940

## 2017-03-27 ENCOUNTER — Ambulatory Visit: Payer: Medicare Other | Admitting: Occupational Therapy

## 2017-03-27 ENCOUNTER — Ambulatory Visit: Payer: Medicare Other | Admitting: Physical Therapy

## 2017-03-27 ENCOUNTER — Encounter: Payer: Self-pay | Admitting: Physical Therapy

## 2017-03-27 DIAGNOSIS — M6281 Muscle weakness (generalized): Secondary | ICD-10-CM | POA: Diagnosis not present

## 2017-03-27 DIAGNOSIS — I69354 Hemiplegia and hemiparesis following cerebral infarction affecting left non-dominant side: Secondary | ICD-10-CM

## 2017-03-27 DIAGNOSIS — R2681 Unsteadiness on feet: Secondary | ICD-10-CM

## 2017-03-27 NOTE — Patient Instructions (Addendum)
Perform these for strengthening M, W, F Functional Quadriceps: Sit to Stand    Sit on edge of chair (use a high chair or build up seat surface with pillows), feet flat on floor. Stand upright, extending knees fully. Repeat 5 (progress reps up to 10 as able)times per set. Do _1-2_ sessions per day.  http://orth.exer.us/735   Copyright  VHI. All rights reserved.    FUNCTIONAL MOBILITY: Wall Squat    Stance: shoulder-width on floor, against wall. Place feet in front of hips. Bend hips and knees. Keep back straight. Do not allow knees to bend past toes. Squeeze glutes and quads to stand. _10_ reps per set, _1_ sets per day, 3 days per week  Copyright  VHI. All rights reserved.    Hip Side Kick    Holding a chair for balance, keep legs shoulder width apart and toes pointed forward. Have red band around ankles.  Bring a leg out to side, keeping knee straight. Do not lean. Repeat using other leg, alternating legs. Repeat _10_ times each leg. Do __1-2__ sessions per day.  http://gt2.exer.us/343   Copyright  VHI. All rights reserved.   Hip Extension (Standing)    Stand with support. Have red band around ankles. Move one leg backward with straight knee and then back to start position. Repeat with other leg. Repeat _10_ times each leg. Do 1-2 times a day.  Copyright  VHI. All rights reserved.   Perform these T, Th and Sat   "I love a Parade" Lift   At counter for balance as needed: high knee marching forward and then backward. 3 second pauses with each knee lift.  Repeat 3 laps each way. Do _1-2_ sessions per day. http://gt2.exer.us/345   Copyright  VHI. All rights reserved.  Walking on Toes   At counter for balance as needed: Walk on toes forward while continuing on a straight path, and then backwards on toes to starting position. Repeat 3 laps each way. Do _1-2___ sessions per day.  Copyright  VHI. All rights reserved.  Feet Heel-Toe "Tandem"    At counter:  Arms at sides, walk a straight line forward bringing one foot directly in front of the other, and then a straight line backwards bringing one foot directly behind the other one.  Repeat for _3 laps each way. Do _1-2_ sessions per day.  Copyright  VHI. All rights reserved.

## 2017-03-27 NOTE — Therapy (Signed)
[facility] [address] [place], [place], [address] Phone: [phone]   Fax:  [phone]  Occupational Therapy Treatment  Patient Details  Name: [name] MRN: [id number] Date of Birth: 28-Nov-1940 Referring Provider: Dr. Letta Pate  Encounter Date: 03/27/2017      OT End of Session - 03/27/17 1332    Visit Number 13   Number of Visits 17   Date for OT Re-Evaluation 04/08/17  Therapist to see pt for last visit on 04/11/17 d/t scheduling conflicts   Authorization Type Medicare/ Medicaid   Authorization Time Period 60 days   Authorization - Visit Number 1   Authorization - Number of Visits 20   OT Start Time 1100   OT Stop Time 1145   OT Time Calculation (min) 45 min   Activity Tolerance Patient tolerated treatment well      Past Medical History:  Diagnosis Date  . Atrial fibrillation ([place])   . Chronic back pain    "mid back down into lower back" (08/25/2014)  . Frequent falls   . GERD (gastroesophageal reflux disease)   . Hypertriglyceridemia   . OSA on CPAP   . Osteoarthritis    "knees, hands, back" (08/25/2014)  . Pneumonia ~ 2000 X 1  . Subdural hematoma ([place]) july 2015   S/P fall while on Coumadin  . T12 compression fracture ([place]) 2012  . Type II diabetes mellitus ([place])     Past Surgical History:  Procedure Laterality Date  . APPENDECTOMY  2012  . CATARACT EXTRACTION W/ INTRAOCULAR LENS  IMPLANT, BILATERAL Bilateral 2000's  . IR GENERIC HISTORICAL  10/15/2016   IR PERCUTANEOUS ART THROMBECTOMY/INFUSION INTRACRANIAL INC DIAG ANGIO 10/15/2016 [name], MD [facility]INTERV RAD  . IR GENERIC HISTORICAL  12/13/2016   IR RADIOLOGIST EVAL & MGMT 12/13/2016 [facility]INTER[facility] RAD  . RADIOLOGY WITH ANESTHESIA N/A 10/15/2016   Procedure: RADIOLOGY WITH ANESTHESIA;  Surgeon: [name], MD;  Location: [facility];  Service: Radiology;  Laterality: N/A;  . TOTAL ABDOMINAL HYSTERECTOMY  1990    There were no vitals  filed for this visit.      Subjective Assessment - 03/27/17 1059    Patient is accompained by: Family member  daughter   Pertinent History R MCA CVA, hx of fall with subdural hematoma, A-fib, PNA   Patient Stated Goals return to cooking   Currently in Pain? Yes  O.T. not addressing - see P.T. note            [facility] OT Assessment - 03/27/17 0001      Hand Function   Right Hand Grip (lbs) 28 LBS   Left Hand Grip (lbs) 22 LBS                  OT Treatments/Exercises (OP) - 03/27/17 0001      Exercises   Exercises Hand     Hand Exercises   Other Hand Exercises Gripper set at level 1 resistance to pick up 1/2 amt of blocks Lt hand with increased drops as pt fatigues, mod difficulty near end. Pt picked up remaining 1/2 blocks Rt hand initially at level 1, but then increased to level 2 resistance with only min difficulty     Fine Motor Coordination   Fine Motor Coordination Small Pegboard   Small Pegboard Pt standing to place medium sized pegs in pegboard (on vertical surface) LUE requiring mid to high level reaching while copying peg design for cognitive component. Pt occasionally helping manipulate peg for  placement with Rt hand assist. Pt copied simple design with some self corrected errors but required min assist to correct < 10% of design     Neurological Re-education Exercises   Other Exercises 1 Reviewed supine ex with ball. Pt still required mod v.c's and tactile cues initially but able to do after repetition. Suggested that daughter still supervise initially to make sure she is doing correctly and instructed daughter on what to have pt avoid doing     Functional Reaching Activities   Mid Level Mid level reaching LUE to place checker pcs into Connect 4 slots (5 rows)    High Level High level reaching RUE to place checker pcs into Connect 4 slots (4 rows) with 1 lb. weight on RUE                  OT Short Term Goals - 03/14/17 1613      OT SHORT TERM  GOAL #1   Title Pt/ caregiver will be I with HEP. due 03/09/17   Time 4   Period Weeks   Status Achieved     OT SHORT TERM GOAL #2   Title Pt will perform simple cooking/ home management in standing x 25 mins without rest break or LOB.   Time 4   Period Weeks   Status Achieved  Pt still requires close supervision     OT SHORT TERM GOAL #3   Title Pt/ caregiver will verbalize understanding of compensatory stategies for short term memory deficits.   Time 4   Period Weeks   Status Achieved     OT SHORT TERM GOAL #4   Title Test standing functional reach and set goal prn   Status Deferred     OT SHORT TERM GOAL #5   Title Pt will demonstrate improved fine motor coordination for ADLS as evidenced by decreasing LUE 9 hole peg test score to 29 secs or less.   Baseline RUE 24.50 secs LUE 32.75 secs   Time 4   Period Weeks   Status On-going  36.18 secs           OT Long Term Goals - 03/27/17 1333      OT LONG TERM GOAL #1   Title Pt will perfom mod complex cooking/ home management in standing x 45 mins without rest break or LOB. due 04/08/17   Time 8   Period Weeks   Status New     OT LONG TERM GOAL #2   Title Pt will increase bilateral grip strength to 25 lbs or greater for increased functional use for ADLs.   Time 8   Period Weeks   Status Partially Met  met on Rt = 28 lbs, not met on Lt = 22 lbs     OT LONG TERM GOAL #3   Title Pt will demonstrate ability to retrieve 2 lbs from overhead shelf at 125 without drops with RUE   Baseline 120   Time 8   Period Weeks   Status On-going     OT LONG TERM GOAL #4   Title Pt will demonstrate ability to retrieve 2 lbs item from ovehead shelf at 120 shoulder flexion with LUE   Baseline 110   Time 8   Period Weeks   Status On-going               Plan - 03/27/17 1334    Clinical Impression Statement Pt progressing towards LTG's yet remains limited by decreased safety and  Lt side awareness   Rehab Potential Fair    OT Frequency 2x / week   OT Duration 8 weeks   OT Treatment/Interventions Self-care/ADL training;Moist Heat;Fluidtherapy;DME and/or AE instruction;Splinting;Patient/family education;Balance training;Therapeutic exercises;Ultrasound;Therapeutic exercise;Therapeutic activities;Cognitive remediation/compensation;Passive range of motion;Functional Mobility Training;Neuromuscular education;Cryotherapy;Electrical Stimulation;Parrafin;Energy conservation;Manual Therapy   Plan continue LUE ROM, functional reaching, grip strength, safety with rollator and/or walker with tray. Consider modifying LTG #1.   OT Home Exercise Plan ball in supine, putty ex   Consulted and Agree with Plan of Care Patient;Family member/caregiver      Patient will benefit from skilled therapeutic intervention in order to improve the following deficits and impairments:  Abnormal gait, Decreased cognition, Decreased knowledge of use of DME, Pain, Impaired sensation, Decreased mobility, Decreased coordination, Decreased activity tolerance, Decreased endurance, Decreased range of motion, Decreased strength, Impaired UE functional use, Impaired perceived functional ability, Difficulty walking, Decreased safety awareness, Decreased knowledge of precautions, Decreased balance  Visit Diagnosis: Hemiplegia and hemiparesis following cerebral infarction affecting left non-dominant side (HCC)  Muscle weakness (generalized)  Unsteadiness on feet    Problem List Patient Active Problem List   Diagnosis Date Noted  . Primary osteoarthritis of left knee 02/09/2017  . SDH (subdural hematoma) ([place])   . Urinary frequency   . Type 2 diabetes mellitus with peripheral neuropathy (HCC)   . Cough   . Gait disturbance, post-stroke 10/20/2016  . Hemiparesis affecting left side as late effect of stroke ([place]) 10/20/2016  . Essential hypertension 10/19/2016  . Hyperlipidemia LDL goal <70 10/19/2016  . 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) 68/61/6837  . Left  hemiparesis ([place])   . Atrial fibrillation ([place])   . History of fall   . History of traumatic subdural hematoma   . Vascular headache   . Hypokalemia   . Leukocytosis   . Acute blood loss anemia   . Hypoalbuminemia due to protein-calorie malnutrition ([place])   . Dysphagia, post-stroke   . Acute respiratory failure ([place])   . Acute embolic stroke (HCC) - R putamen/caudate and R insular infarcts s/p TICI3 revascularization w/ mechanical thrombectomy d/t AF not on [facility] 10/15/2016  . Pressure injury of skin 10/15/2016  . HCAP (healthcare-associated pneumonia) 11/09/2014  . Weakness 08/25/2014  . Acute bronchitis 08/25/2014  . UTI (lower urinary tract infection) 08/25/2014  . Fracture of lumbar spine ([place]) 08/25/2014  . Subdural hematoma, post-traumatic ([place]) 05/12/2014  . Chronic atrial fibrillation ([place]) 05/12/2014  . Diabetes mellitus ([place]) 05/12/2014  . OSA on CPAP 05/12/2014    Carey Bullocks, OTR/L 03/27/2017, 1:36 PM  [facility] [address] [place], [place], [address] Phone: [phone]   Fax:  [phone]  Name: Shianne Zeiser MRN: [id number] Date of Birth: 04-03-1941

## 2017-03-28 NOTE — Therapy (Signed)
[employer] [address] [place], [place], [address] Phone: [phone]   Fax:  [phone]  Physical Therapy Treatment  Patient Details  Name: [name] MRN: [id number] Date of Birth: 1941/10/12 Referring Provider: Alysia Penna, MD  Encounter Date: 03/27/2017      PT End of Session - 03/27/17 1023    Visit Number 13   Number of Visits 17   Date for PT Re-Evaluation 04/09/17  re-assess on 6/20   Authorization Type Medicare & G-codes with proress note every 10th visit    PT Start Time 1015   PT Stop Time 1100   PT Time Calculation (min) 45 min   Activity Tolerance Patient tolerated treatment well   Behavior During Therapy [facility] for tasks assessed/performed      Past Medical History:  Diagnosis Date  . Atrial fibrillation ([place])   . Chronic back pain    "mid back down into lower back" (08/25/2014)  . Frequent falls   . GERD (gastroesophageal reflux disease)   . Hypertriglyceridemia   . OSA on CPAP   . Osteoarthritis    "knees, hands, back" (08/25/2014)  . Pneumonia ~ 2000 X 1  . Subdural hematoma ([place]) july 2015   S/P fall while on Coumadin  . T12 compression fracture ([place]) 2012  . Type II diabetes mellitus ([place])     Past Surgical History:  Procedure Laterality Date  . APPENDECTOMY  2012  . CATARACT EXTRACTION W/ INTRAOCULAR LENS  IMPLANT, BILATERAL Bilateral 2000's  . IR GENERIC HISTORICAL  10/15/2016   IR PERCUTANEOUS ART THROMBECTOMY/INFUSION INTRACRANIAL INC DIAG ANGIO 10/15/2016 [name], MD [facility]INTER[facility]  . IR GENERIC HISTORICAL  12/13/2016   IR RADIOLOGIST EVAL & MGMT 12/13/2016 [facility]  . RADIOLOGY WITH ANESTHESIA N/A 10/15/2016   Procedure: RADIOLOGY WITH ANESTHESIA;  Surgeon: [name], MD;  Location: [facility];  Service: Radiology;  Laterality: N/A;  . TOTAL ABDOMINAL HYSTERECTOMY  1990    There were no vitals filed for this visit.      Subjective Assessment - 03/27/17  1022    Subjective No new complaints. No falls to report.    Patient is accompained by: Family member   Limitations Walking;House hold activities;Other (comment);Standing   Patient Stated Goals To treat her weak legs and walk free of AD   Pain Score 4    Pain Location Knee   Pain Orientation Left   Pain Descriptors / Indicators Sore   Pain Type Chronic pain   Pain Onset More than a month ago   Pain Frequency Intermittent   Aggravating Factors  standing   Pain Relieving Factors rest     Treatment: Pt and daughter brought in all exercises issued from PT, both HHPT and outpatient PT. Today's session focused on consolidation of exercises with final HEP issued in written form at end of today's session. No significant issues reported with performance in today's session. Pt did report mild increase in bil knee pain with sit/stands that eased off with rest. Advised them to only do 5 reps initially and from a higher surface than the low mat pt was seated on in therapy today. If pain continues to occur, discontinue exercise and let us know. Both verbalized understanding.   Issued the following to HEP:   Sit on edge of chair (use a high chair or build up seat surface with pillows), feet flat on floor. Stand upright, extending knees fully. Repeat 5 (progress reps up to 10 as able)times per  set. Do _1-2_ sessions per day.  http://orth.exer.us/735   Copyright  VHI. All rights reserved.    FUNCTIONAL MOBILITY: Wall Squat    Stance: shoulder-width on floor, against wall. Place feet in front of hips. Bend hips and knees. Keep back straight. Do not allow knees to bend past toes. Squeeze glutes and quads to stand. _10_ reps per set, _1_ sets per day, 3 days per week  Copyright  VHI. All rights reserved.    Hip Side Kick    Holding a chair for balance, keep legs shoulder width apart and toes pointed forward. Have red band around ankles.  Bring a leg out to side, keeping knee straight. Do not  lean. Repeat using other leg, alternating legs. Repeat _10_ times each leg. Do __1-2__ sessions per day.  http://gt2.exer.us/343   Copyright  VHI. All rights reserved.   Hip Extension (Standing)    Stand with support. Have red band around ankles. Move one leg backward with straight knee and then back to start position. Repeat with other leg. Repeat _10_ times each leg. Do 1-2 times a day.  Copyright  VHI. All rights reserved.   Perform these T, Th and Sat   "I love a Parade" Lift   At counter for balance as needed: high knee marching forward and then backward. 3 second pauses with each knee lift.  Repeat 3 laps each way. Do _1-2_ sessions per day. http://gt2.exer.us/345   Copyright  VHI. All rights reserved.  Walking on Toes   At counter for balance as needed: Walk on toes forward while continuing on a straight path, and then backwards on toes to starting position. Repeat 3 laps each way. Do _1-2___ sessions per day.  Copyright  VHI. All rights reserved.  Feet Heel-Toe "Tandem"    At counter: Arms at sides, walk a straight line forward bringing one foot directly in front of the other, and then a straight line backwards bringing one foot directly behind the other one.  Repeat for _3 laps each way. Do _1-2_ sessions per day.  Copyright  VHI. All rights reserved.             PT Education - 03/27/17 1630    Education provided Yes   Education Details consolidation of HEP for strengthening and balance   Person(s) Educated Patient;Child(ren)   Methods Demonstration;Explanation;Verbal cues;Handout   Comprehension Verbalized understanding;Returned demonstration;Verbal cues required;Need further instruction             PT Short Term Goals - 03/10/17 1305      PT SHORT TERM GOAL #1   Title (TARGET DATE: 03/10/2017) Pt will participate in stair negotiation assessment and vestibular evaluation with goal to be set if needed.   Baseline Both assessed   Status  Achieved     PT SHORT TERM GOAL #2   Title Patient decrease falls risk as indicated by a gait velocity of > or = 1.3f/s with SPC.  (TARGET DATE: 03/10/2017)    Baseline 1.216fsec with cane, 1.1559fec on 5/24   Status Not Met     PT SHORT TERM GOAL #3   Title Patient will demonstrate ability to ambulate 250 feet over level surfaces with SPC with 25% cues for increased foot clearance/step length and perform 180 deg turns to L and R with supervision overall. (TARGET DATE: 03/10/2017)    Baseline min A x 250' over level surfaces with quad cane, >75% cues   Time 4   Period Weeks   Status Partially Met  PT SHORT TERM GOAL #4   Title TUG will be performed and long term goal to be set. (TARGET DATE: 03/10/2017)    Baseline met 02/17/17   Time 4   Period Weeks   Status Achieved     PT SHORT TERM GOAL #5   Title Pt will improve balance and decrease falls risk as indicated by improvement in BERG score to >or = 37/56   Baseline 30/56; 46/56 on 5/24   Status Achieved           PT Long Term Goals - 02/17/17 1233      PT LONG TERM GOAL #1   Title Patient will demonstrate independence with HEP and verbalize options for community fitness program. (TARGET DATE: 04/07/2017)   Time 8   Period Weeks   Status New     PT LONG TERM GOAL #2   Title Patient will improve gait velocity to >2.0 ft/sec with SPC to indicate pt is safe for limited community ambulation. (TARGET DATE: 04/07/2017)    Time 8   Period Weeks   Status New     PT LONG TERM GOAL #3   Title Patient will score > or = 42/56 on the Berg Balance Test to indicate decreased falls risk. (TARGET DATE: 04/07/2017)    Time 8   Period Weeks   Status New     PT LONG TERM GOAL #4   Title Pt will decrease falls risk with sit <> stand and turning as indicated by TUG score of <18   Baseline 25   Time 8   Period Weeks   Status New     PT LONG TERM GOAL #5   Title Patient will ambulate 500 feet outside over uneven terrain including  ramps/curbs with SPC and will negotiate 3 stairs with SPC, 1 rail with supervision for community ambulation. (TARGET DATE: 04/07/2017)   Time 8   Period Weeks   Status New     PT LONG TERM GOAL #6   Title Pt will improve LLE strength to 4/5 overall at hip, knee and ankle   Baseline 3+ hip flexion, 4- knee extension, 4 knee flexion, 3- ankle DF   Time 8   Period Weeks   Status New            Plan - 03/27/17 1024    Clinical Impression Statement Today's skilled session focused on review/discussion of pt's current HEP from PT, both HHPT and outpt PT. Issued a consolidated HEP at end of session with pt performing them in session. Pt is progressing towards goals and should benefit from continued PT to progress toward unmet goals.                                            Rehab Potential Good   Clinical Impairments Affecting Rehab Potential pain of chronic nature   PT Frequency 2x / week   PT Duration 8 weeks   PT Treatment/Interventions ADLs/Self Care Home Management;Canalith Repostioning;Electrical Stimulation;DME Instruction;Gait training;Stair training;Functional mobility training;Therapeutic activities;Therapeutic exercise;Balance training;Neuromuscular re-education;Cognitive remediation;Patient/family education;Orthotic Fit/Training;Energy conservation;Taping;Vestibular   PT Next Visit Plan gait training with rollator; Treadmill, Standing balance, lateral weight shifting-especially to R, tandem stance with weight shifting, SLS, foot clearance during gait   Consulted and Agree with Plan of Care Patient;Family member/caregiver   Family Member Consulted daughter      Patient will benefit from  skilled therapeutic intervention in order to improve the following deficits and impairments:  Abnormal gait, Decreased endurance, Decreased balance, Decreased cognition, Decreased mobility, Decreased strength, Difficulty walking, Dizziness, Impaired sensation, Pain  Visit Diagnosis: Hemiplegia and  hemiparesis following cerebral infarction affecting left non-dominant side (HCC)  Unsteadiness on feet  Muscle weakness (generalized)     Problem List Patient Active Problem List   Diagnosis Date Noted  . Primary osteoarthritis of left knee 02/09/2017  . SDH (subdural hematoma) ([place])   . Urinary frequency   . Type 2 diabetes mellitus with peripheral neuropathy (HCC)   . Cough   . Gait disturbance, post-stroke 10/20/2016  . Hemiparesis affecting left side as late effect of stroke ([place]) 10/20/2016  . Essential hypertension 10/19/2016  . Hyperlipidemia LDL goal <70 10/19/2016  . 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 Left hemiparesis ([place])   . Atrial fibrillation ([place])   . History of fall   . History of traumatic subdural hematoma   . Vascular headache   . Hypokalemia   . Leukocytosis   . Acute blood loss anemia   . Hypoalbuminemia due to protein-calorie malnutrition ([place])   . Dysphagia, post-stroke   . Acute respiratory failure ([place])   . Acute embolic stroke (HCC) - R putamen/caudate and R insular infarcts s/p TICI3 revascularization w/ mechanical thrombectomy d/t AF not on [facility] 10/15/2016  . Pressure injury of skin 10/15/2016  . HCAP (healthcare-associated pneumonia) 11/09/2014  . Weakness 08/25/2014  . Acute bronchitis 08/25/2014  . UTI (lower urinary tract infection) 08/25/2014  . Fracture of lumbar spine ([place]) 08/25/2014  . Subdural hematoma, post-traumatic ([place]) 05/12/2014  . Chronic atrial fibrillation ([place]) 05/12/2014  . Diabetes mellitus ([place]) 05/12/2014  . OSA on CPAP 05/12/2014    Willow Ora, PTA, [facility] Outpatient Neuro [facility] [address] [employer] [place], [place] [address] [phone] 03/28/17, 11:23 AM   Name: [name] MRN: [id number] Date of Birth: [birth year]

## 2017-03-29 ENCOUNTER — Ambulatory Visit: Payer: Medicare Other | Admitting: Physical Therapy

## 2017-03-29 ENCOUNTER — Encounter: Payer: Self-pay | Admitting: Physical Therapy

## 2017-03-29 ENCOUNTER — Ambulatory Visit: Payer: Medicare Other | Admitting: Occupational Therapy

## 2017-03-29 DIAGNOSIS — R296 Repeated falls: Secondary | ICD-10-CM

## 2017-03-29 DIAGNOSIS — I69354 Hemiplegia and hemiparesis following cerebral infarction affecting left non-dominant side: Secondary | ICD-10-CM

## 2017-03-29 DIAGNOSIS — M6281 Muscle weakness (generalized): Secondary | ICD-10-CM

## 2017-03-29 DIAGNOSIS — R2681 Unsteadiness on feet: Secondary | ICD-10-CM

## 2017-03-29 DIAGNOSIS — I69315 Cognitive social or emotional deficit following cerebral infarction: Secondary | ICD-10-CM

## 2017-03-29 DIAGNOSIS — R2689 Other abnormalities of gait and mobility: Secondary | ICD-10-CM

## 2017-03-29 DIAGNOSIS — R42 Dizziness and giddiness: Secondary | ICD-10-CM

## 2017-03-29 NOTE — Therapy (Signed)
[facility] [address] [place], [place], [address] Phone: [phone]   Fax:  [phone]  Physical Therapy Treatment  Patient Details  Name: [name] MRN: [id number] Date of Birth: [birth year] Referring Provider: Alysia Penna, MD  Encounter Date: 03/29/2017      PT End of Session - 03/29/17 1259    Visit Number 14   Number of Visits 17   Date for PT Re-Evaluation 04/09/17  re-assess on 6/20   Authorization Type Medicare & G-codes with proress note every 10th visit    PT Start Time 1102   PT Stop Time 1145   PT Time Calculation (min) 43 min   Activity Tolerance Patient tolerated treatment well   Behavior During Therapy [facility] for tasks assessed/performed      Past Medical History:  Diagnosis Date  . Atrial fibrillation ([place])   . Chronic back pain    "mid back down into lower back" (08/25/2014)  . Frequent falls   . GERD (gastroesophageal reflux disease)   . Hypertriglyceridemia   . OSA on CPAP   . Osteoarthritis    "knees, hands, back" (08/25/2014)  . Pneumonia ~ 2000 X 1  . Subdural hematoma ([place]) july 2015   S/P fall while on Coumadin  . T12 compression fracture ([place]) 2012  . Type II diabetes mellitus ([place])     Past Surgical History:  Procedure Laterality Date  . APPENDECTOMY  2012  . CATARACT EXTRACTION W/ INTRAOCULAR LENS  IMPLANT, BILATERAL Bilateral 2000's  . IR GENERIC HISTORICAL  10/15/2016   IR PERCUTANEOUS ART THROMBECTOMY/INFUSION INTRACRANIAL INC DIAG ANGIO 10/15/2016 [name], MD [facility]INTER[facility]  . IR GENERIC HISTORICAL  12/13/2016   IR RADIOLOGIST EVAL & MGMT 12/13/2016 [facility]  . RADIOLOGY WITH ANESTHESIA N/A 10/15/2016   Procedure: RADIOLOGY WITH ANESTHESIA;  Surgeon: [name], MD;  Location: [facility];  Service: Radiology;  Laterality: N/A;  . TOTAL ABDOMINAL HYSTERECTOMY  1990    There were no vitals filed for this visit.      Subjective Assessment - 03/29/17  1105    Subjective Has new set of exercises for HEP; has been performing them without any issues.  Daughter has not bought a rollator yet but it is on her to-do list.     Patient is accompained by: Family member   Pertinent History falls with subdural hematoma, Afib, chronic back pain, T12 compression fx, type 2 DM, OA, PNA   Limitations Walking;House hold activities;Other (comment);Standing   Patient Stated Goals To treat her weak legs and walk free of AD   Currently in Pain? Yes   Pain Score 4    Pain Location Back   Pain Orientation Left;Lower   Pain Descriptors / Indicators Aching   Pain Type Chronic pain            [facility] PT Assessment - 03/29/17 1129      Ambulation/Gait   Ambulation/Gait Yes   Ambulation/Gait Assistance 4: Min guard   Ambulation/Gait Assistance Details gait x 25' x 4 reps with weights on the floor on R and L to cue pt to increase foot clearance and step length; required max cues to come closer to weight before stepping over to avoid stepping onto object.     Ambulation Distance (Feet) 100 Feet   Assistive device Rollator   Gait Pattern Step-to pattern;Step-through pattern;Decreased step length - right;Decreased step length - left;Decreased stance time - right;Decreased stance time - left;Decreased stride length;Decreased dorsiflexion - right;Decreased dorsiflexion -  left;Decreased hip/knee flexion - right;Decreased hip/knee flexion - left;Decreased weight shift to right;Decreased weight shift to left;Shuffle;Decreased trunk rotation;Poor foot clearance - left;Poor foot clearance - right     Standardized Balance Assessment   Standardized Balance Assessment Berg Balance Test;Timed Up and Go Test;10 meter walk test   10 Meter Walk 23.35 seconds or 1.34f/sec with cane and 20.07 seconds or 1.63 ft/sec with rollator     Berg Balance Test   Sit to Stand Able to stand without using hands and stabilize independently   Standing Unsupported Able to stand safely 2  minutes   Sitting with Back Unsupported but Feet Supported on Floor or Stool Able to sit safely and securely 2 minutes   Stand to Sit Sits safely with minimal use of hands   Transfers Able to transfer safely, minor use of hands   Standing Unsupported with Eyes Closed Able to stand 10 seconds safely   Standing Ubsupported with Feet Together Able to place feet together independently and stand 1 minute safely   From Standing, Reach Forward with Outstretched Arm Can reach forward >12 cm safely (5")  reports back pain today   From Standing Position, Pick up Object from Floor Able to pick up shoe safely and easily   From Standing Position, Turn to Look Behind Over each Shoulder Looks behind one side only/other side shows less weight shift   Turn 360 Degrees Able to turn 360 degrees safely but slowly   Standing Unsupported, Alternately Place Feet on Step/Stool Able to stand independently and safely and complete 8 steps in 20 seconds   Standing Unsupported, One Foot in Front Able to take small step independently and hold 30 seconds   Standing on One Leg Tries to lift leg/unable to hold 3 seconds but remains standing independently   Total Score 47   Berg comment: 47/56     Timed Up and Go Test   TUG Normal TUG   Normal TUG (seconds) 31   TUG Comments 31 seconds with cane, 32.18 seconds with rollator-increased time due to locking/unlocking brakes                             PT Education - 03/29/17 1258    Education provided Yes   Education Details update of POC next week after pelvic floor PT evaluation   Person(s) Educated Patient;Child(ren)   Methods Explanation   Comprehension Verbalized understanding          PT Short Term Goals - 03/10/17 1305      PT SHORT TERM GOAL #1   Title (TARGET DATE: 03/10/2017) Pt will participate in stair negotiation assessment and vestibular evaluation with goal to be set if needed.   Baseline Both assessed   Status Achieved     PT  SHORT TERM GOAL #2   Title Patient decrease falls risk as indicated by a gait velocity of > or = 1.363fs with SPC.  (TARGET DATE: 03/10/2017)    Baseline 1.2867fec with cane, 1.22f30fc on 5/24   Status Not Met     PT SHORT TERM GOAL #3   Title Patient will demonstrate ability to ambulate 250 feet over level surfaces with SPC with 25% cues for increased foot clearance/step length and perform 180 deg turns to L and R with supervision overall. (TARGET DATE: 03/10/2017)    Baseline min A x 250' over level surfaces with quad cane, >75% cues   Time 4   Period Weeks  Status Partially Met     PT SHORT TERM GOAL #4   Title TUG will be performed and long term goal to be set. (TARGET DATE: 03/10/2017)    Baseline met 02/17/17   Time 4   Period Weeks   Status Achieved     PT SHORT TERM GOAL #5   Title Pt will improve balance and decrease falls risk as indicated by improvement in BERG score to >or = 37/56   Baseline 30/56; 46/56 on 5/24   Status Achieved           PT Long Term Goals - 03/29/17 1301      PT LONG TERM GOAL #1   Title Patient will demonstrate independence with HEP and verbalize options for community fitness program. (TARGET DATE: 04/07/2017)   Time 8   Period Weeks   Status On-going     PT LONG TERM GOAL #2   Title Patient will improve gait velocity to >2.0 ft/sec with SPC to indicate pt is safe for limited community ambulation. (TARGET DATE: 04/07/2017)    Baseline 1.63f/sec with cane   Time 8   Period Weeks   Status Partially Met     PT LONG TERM GOAL #3   Title Patient will score > or = 42/56 on the Berg Balance Test to indicate decreased falls risk. (TARGET DATE: 04/07/2017)    Baseline 47/56 on 6/13   Time --   Period --   Status Achieved     PT LONG TERM GOAL #4   Title Pt will decrease falls risk with sit <> stand and turning as indicated by TUG score of <18   Baseline 31 with cane   Time 8   Period Weeks   Status Partially Met     PT LONG TERM GOAL #5    Title Patient will ambulate 500 feet outside over uneven terrain including ramps/curbs with SPC and will negotiate 3 stairs with SPC, 1 rail with supervision for community ambulation. (TARGET DATE: 04/07/2017)   Time 8   Period Weeks   Status New     PT LONG TERM GOAL #6   Title Pt will improve LLE strength to 4/5 overall at hip, knee and ankle   Baseline 3+ hip flexion, 4- knee extension, 4 knee flexion, 3- ankle DF   Time 8   Period Weeks   Status New               Plan - 03/29/17 1259    Clinical Impression Statement Began to re-assess LTG today due to pt only being seen one visit next week due to pelvic floor evaluation.  Pt is demonstrating improvements in gait sequence and gait velocity overall but not to target level; pt demonstrating improved static and dynamic standing balance and did meet BERG balance goal with score of 47/56.  Continued gait training with rollator with focus on obstacle negotiation, increasing foot clearance during gait with rollator and safety with locking/unlocking brakes during transfers.  Pt is now remembering to lock brakes during sit <> stand; will continue to address single limb stance and shuffling gait with rollator     Rehab Potential Good   Clinical Impairments Affecting Rehab Potential pain of chronic nature   PT Frequency 2x / week   PT Duration 8 weeks   PT Treatment/Interventions ADLs/Self Care Home Management;Canalith Repostioning;Electrical Stimulation;DME Instruction;Gait training;Stair training;Functional mobility training;Therapeutic activities;Therapeutic exercise;Balance training;Neuromuscular re-education;Cognitive remediation;Patient/family education;Orthotic Fit/Training;Energy conservation;Taping;Vestibular   PT Next Visit Plan get POC information from  pelvic floor PT; continue to assess remaining LTG-recertify.  Continue gait trainig with cane and rollator-more upright trunk, weight shifting/foot clearance, gait outside, stairs    Recommended Other Services purchase/set up rollator   Consulted and Agree with Plan of Care Patient   Family Member Consulted daughter      Patient will benefit from skilled therapeutic intervention in order to improve the following deficits and impairments:  Abnormal gait, Decreased endurance, Decreased balance, Decreased cognition, Decreased mobility, Decreased strength, Difficulty walking, Dizziness, Impaired sensation, Pain  Visit Diagnosis: Muscle weakness (generalized)  Unsteadiness on feet  Other abnormalities of gait and mobility  Repeated falls  Hemiplegia and hemiparesis following cerebral infarction affecting left non-dominant side (HCC)  Dizziness and giddiness     Problem List Patient Active Problem List   Diagnosis Date Noted  . Primary osteoarthritis of left knee 02/09/2017  . SDH (subdural hematoma) ([place])   . Urinary frequency   . Type 2 diabetes mellitus with peripheral neuropathy (HCC)   . Cough   . Gait disturbance, post-stroke 10/20/2016  . Hemiparesis affecting left side as late effect of stroke ([place]) 10/20/2016  . Essential hypertension 10/19/2016  . Hyperlipidemia LDL goal <70 10/19/2016  . 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 Left hemiparesis ([place])   . Atrial fibrillation ([place])   . History of fall   . History of traumatic subdural hematoma   . Vascular headache   . Hypokalemia   . Leukocytosis   . Acute blood loss anemia   . Hypoalbuminemia due to protein-calorie malnutrition ([place])   . Dysphagia, post-stroke   . Acute respiratory failure ([place])   . Acute embolic stroke (HCC) - R putamen/caudate and R insular infarcts s/p TICI3 revascularization w/ mechanical thrombectomy d/t AF not on [facility] 10/15/2016  . Pressure injury of skin 10/15/2016  . HCAP (healthcare-associated pneumonia) 11/09/2014  . Weakness 08/25/2014  . Acute bronchitis 08/25/2014  . UTI (lower urinary tract infection) 08/25/2014  . Fracture of lumbar spine ([place]) 08/25/2014   . Subdural hematoma, post-traumatic ([place]) 05/12/2014  . Chronic atrial fibrillation ([place]) 05/12/2014  . Diabetes mellitus ([place]) 05/12/2014  . OSA on CPAP 05/12/2014   Raylene Everts, PT, DPT 03/29/17    1:07 PM    [facility] [address] [place], [place], [address] Phone: [phone]   Fax:  [phone]  Name: [name] MRN: [id number] Date of Birth: 1941-01-17

## 2017-03-29 NOTE — Therapy (Signed)
[facility] [address] [place], [place], [address] Phone: [phone]   Fax:  [phone]  Occupational Therapy Treatment  Patient Details  Name: [name] MRN: [id number] Date of Birth: [birth year] Referring Provider: Dr. Letta Pate  Encounter Date: 03/29/2017      OT End of Session - 03/29/17 1115    Visit Number 14   Number of Visits 17   Date for OT Re-Evaluation 04/08/17  Pt to d/c next visit   Authorization Type Medicare/ Medicaid   Authorization Time Period 60 days   Authorization - Visit Number 14   Authorization - Number of Visits 20   OT Start Time 1018  2 units only pt in BR   OT Stop Time 1100   OT Time Calculation (min) 42 min   Activity Tolerance Patient tolerated treatment well   Behavior During Therapy [facility] for tasks assessed/performed      Past Medical History:  Diagnosis Date  . Atrial fibrillation ([place])   . Chronic back pain    "mid back down into lower back" (08/25/2014)  . Frequent falls   . GERD (gastroesophageal reflux disease)   . Hypertriglyceridemia   . OSA on CPAP   . Osteoarthritis    "knees, hands, back" (08/25/2014)  . Pneumonia ~ 2000 X 1  . Subdural hematoma ([place]) july 2015   S/P fall while on Coumadin  . T12 compression fracture ([place]) 2012  . Type II diabetes mellitus ([place])     Past Surgical History:  Procedure Laterality Date  . APPENDECTOMY  2012  . CATARACT EXTRACTION W/ INTRAOCULAR LENS  IMPLANT, BILATERAL Bilateral 2000's  . IR GENERIC HISTORICAL  10/15/2016   IR PERCUTANEOUS ART THROMBECTOMY/INFUSION INTRACRANIAL INC DIAG ANGIO 10/15/2016 [name], MD [facility]INTER[facility] RAD  . IR GENERIC HISTORICAL  12/13/2016   IR RADIOLOGIST EVAL & MGMT 12/13/2016 [facility]  . RADIOLOGY WITH ANESTHESIA N/A 10/15/2016   Procedure: RADIOLOGY WITH ANESTHESIA;  Surgeon: [name], MD;  Location: [facility];  Service: Radiology;  Laterality: N/A;  . TOTAL ABDOMINAL  HYSTERECTOMY  1990    There were no vitals filed for this visit.      Subjective Assessment - 03/29/17 1022    Subjective  Reports back pain   Pertinent History R MCA CVA, hx of fall with subdural hematoma, A-fib, PNA   Patient Stated Goals return to cooking   Currently in Pain? Yes   Pain Score 4    Pain Location Back   Pain Descriptors / Indicators Aching   Pain Type Chronic pain   Pain Onset More than a month ago   Pain Frequency Intermittent   Aggravating Factors  standing   Pain Relieving Factors rest   Multiple Pain Sites No          Treatment: Functional standing activities while reaching with LUE, close supervision and min v.c for left side awareness, pt demonstrates standing tolerance today of only 8-10 mins due to back pain (however in the past she has stood for approx 25 mins fold laundry) Reviewed ball exercises in supine, min v.c and demonstration for proper positioning. Therapist discussed plans for d/c next visit with pt/ dtr, they are in agreement. Therapist recommends pt has continued supervision at home with cooking tasks and recommends use of rollator for gathering items in the kitchen. Therapist educated pt's daughter that pt's decreased safety awareness is likely due to cognitive changes from the CVA which may have impacted pt's short term memory. Therapist encouraged  pt's dtr to reinforce safety strategies at home. Pt will continue to work with PT in the future to address balance and safety with mobility.                     OT Short Term Goals - 03/29/17 1117      OT SHORT TERM GOAL #1   Title Pt/ caregiver will be I with HEP. due 03/09/17   Time 4   Period Weeks   Status Achieved     OT SHORT TERM GOAL #2   Title Pt will perform simple cooking/ home management in standing x 25 mins without rest break or LOB.   Time 4   Period Weeks   Status Achieved  Pt still requires close supervision     OT SHORT TERM GOAL #3   Title Pt/  caregiver will verbalize understanding of compensatory stategies for short term memory deficits.   Time 4   Period Weeks   Status Achieved     OT SHORT TERM GOAL #4   Title Test standing functional reach and set goal prn   Status Deferred     OT SHORT TERM GOAL #5   Title Pt will demonstrate improved fine motor coordination for ADLS as evidenced by decreasing LUE 9 hole peg test score to 29 secs or less.   Baseline RUE 24.50 secs LUE 32.75 secs   Time 4   Period Weeks   Status On-going  36.18 secs           OT Long Term Goals - 03/29/17 1042      OT LONG TERM GOAL #1   Title Pt will perfom mod complex cooking/ home management in standing x 45 mins without rest break or LOB. due 04/08/17   Time 8   Period Weeks   Status Not Met  Pt requires supervision and min v.c for safety for simple cooking, pt stands for 15-25 mins max     OT LONG TERM GOAL #2   Title Pt will increase bilateral grip strength to 25 lbs or greater for increased functional use for ADLs.   Time 8   Period Weeks   Status Partially Met     OT LONG TERM GOAL #3   Title Pt will demonstrate ability to retrieve 2 lbs from overhead shelf at 125 without drops with RUE   Time 8   Period Weeks   Status On-going     OT LONG TERM GOAL #4   Title Pt will demonstrate ability to retrieve 2 lbs item from ovehead shelf at 120 shoulder flexion with LUE   Time 8   Period Weeks   Status On-going               Plan - 03/29/17 1118    Clinical Impression Statement Pt demonstrates overall progress. She will continue to require supervision for cooking tasks due to decreased carryover of safety strategies and decreased left side awareness. Pt/ dtr agree with plans for d/c next visit.   Rehab Potential Fair   OT Frequency 2x / week   OT Duration 8 weeks   OT Treatment/Interventions Self-care/ADL training;Moist Heat;Fluidtherapy;DME and/or AE instruction;Splinting;Patient/family education;Balance  training;Therapeutic exercises;Ultrasound;Therapeutic exercise;Therapeutic activities;Cognitive remediation/compensation;Passive range of motion;Functional Mobility Training;Neuromuscular education;Cryotherapy;Electrical Stimulation;Parrafin;Energy conservation;Manual Therapy   Plan review safety retrieving items with rollator, check remaining goals.   OT Home Exercise Plan ball in supine, putty ex   Consulted and Agree with Plan of Care Patient;Family member/caregiver   Family  Member Consulted daughter      Patient will benefit from skilled therapeutic intervention in order to improve the following deficits and impairments:  Abnormal gait, Decreased cognition, Decreased knowledge of use of DME, Pain, Impaired sensation, Decreased mobility, Decreased coordination, Decreased activity tolerance, Decreased endurance, Decreased range of motion, Decreased strength, Impaired UE functional use, Impaired perceived functional ability, Difficulty walking, Decreased safety awareness, Decreased knowledge of precautions, Decreased balance  Visit Diagnosis: Hemiplegia and hemiparesis following cerebral infarction affecting left non-dominant side (HCC)  Muscle weakness (generalized)  Unsteadiness on feet  Other abnormalities of gait and mobility  Cognitive social or emotional deficit following cerebral infarction    Problem List Patient Active Problem List   Diagnosis Date Noted  . Primary osteoarthritis of left knee 02/09/2017  . SDH (subdural hematoma) ([place])   . Urinary frequency   . Type 2 diabetes mellitus with peripheral neuropathy (HCC)   . Cough   . Gait disturbance, post-stroke 10/20/2016  . Hemiparesis affecting left side as late effect of stroke ([place]) 10/20/2016  . Essential hypertension 10/19/2016  . Hyperlipidemia LDL goal <70 10/19/2016  . 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) 13/64/3837  . Left hemiparesis ([place])   . Atrial fibrillation ([place])   . History of fall   . History of traumatic  subdural hematoma   . Vascular headache   . Hypokalemia   . Leukocytosis   . Acute blood loss anemia   . Hypoalbuminemia due to protein-calorie malnutrition ([place])   . Dysphagia, post-stroke   . Acute respiratory failure ([place])   . Acute embolic stroke (HCC) - R putamen/caudate and R insular infarcts s/p TICI3 revascularization w/ mechanical thrombectomy d/t AF not on [facility] 10/15/2016  . Pressure injury of skin 10/15/2016  . HCAP (healthcare-associated pneumonia) 11/09/2014  . Weakness 08/25/2014  . Acute bronchitis 08/25/2014  . UTI (lower urinary tract infection) 08/25/2014  . Fracture of lumbar spine ([place]) 08/25/2014  . Subdural hematoma, post-traumatic ([place]) 05/12/2014  . Chronic atrial fibrillation ([place]) 05/12/2014  . Diabetes mellitus ([place]) 05/12/2014  . OSA on CPAP 05/12/2014    [name] 03/29/2017, 11:21 AM [name], [name] [phone] Phone: [phone] 11:27 AM 03/29/17 [facility] [address] [place], [place], [address] Phone: [phone]   Fax:  [phone]  Name: [name] MRN: [id number] Date of Birth: 1941/04/15

## 2017-04-04 ENCOUNTER — Ambulatory Visit: Payer: Medicare Other | Attending: Urology | Admitting: Physical Therapy

## 2017-04-04 ENCOUNTER — Encounter: Payer: Self-pay | Admitting: Physical Therapy

## 2017-04-04 ENCOUNTER — Ambulatory Visit: Payer: Medicare Other | Admitting: Physical Therapy

## 2017-04-04 ENCOUNTER — Encounter: Payer: Medicare Other | Admitting: Occupational Therapy

## 2017-04-04 DIAGNOSIS — R293 Abnormal posture: Secondary | ICD-10-CM | POA: Insufficient documentation

## 2017-04-04 DIAGNOSIS — M62838 Other muscle spasm: Secondary | ICD-10-CM | POA: Insufficient documentation

## 2017-04-04 DIAGNOSIS — M6281 Muscle weakness (generalized): Secondary | ICD-10-CM

## 2017-04-04 DIAGNOSIS — R278 Other lack of coordination: Secondary | ICD-10-CM | POA: Diagnosis present

## 2017-04-04 NOTE — Patient Instructions (Signed)

## 2017-04-04 NOTE — Therapy (Signed)
[facility] [address] [place], [place], [address] Phone: [phone]   Fax:  [phone]  Physical Therapy Evaluation  Patient Details  Name: [name] MRN: [id number] Date of Birth: 1941/08/08 Referring Provider: Esaw Dace  Encounter Date: 04/04/2017      PT End of Session - 04/04/17 2208    Visit Number 15   Number of Visits 17   Date for PT Re-Evaluation 06/27/17   Authorization Type Medicare & G-codes with proress note every 10th visit    PT Start Time 1101   PT Stop Time 1145   PT Time Calculation (min) 44 min   Activity Tolerance Patient tolerated treatment well   Behavior During Therapy [facility] for tasks assessed/performed      Past Medical History:  Diagnosis Date  . Atrial fibrillation (HCC)   . Chronic back pain    "mid back down into lower back" (08/25/2014)  . Frequent falls   . GERD (gastroesophageal reflux disease)   . Hypertriglyceridemia   . OSA on CPAP   . Osteoarthritis    "knees, hands, back" (08/25/2014)  . Pneumonia ~ 2000 X 1  . Subdural hematoma (HCC) july 2015   S/P fall while on Coumadin  . T12 compression fracture (HCC) 2012  . Type II diabetes mellitus (HCC)     Past Surgical History:  Procedure Laterality Date  . APPENDECTOMY  2012  . CATARACT EXTRACTION W/ INTRAOCULAR LENS  IMPLANT, BILATERAL Bilateral 2000's  . IR GENERIC HISTORICAL  10/15/2016   IR PERCUTANEOUS ART THROMBECTOMY/INFUSION INTRACRANIAL INC DIAG ANGIO 10/15/2016 [name], MD [facility]  . IR GENERIC HISTORICAL  12/13/2016   IR RADIOLOGIST EVAL & MGMT 12/13/2016 [facility]  . RADIOLOGY WITH ANESTHESIA N/A 10/15/2016   Procedure: RADIOLOGY WITH ANESTHESIA;  Surgeon: [name], MD;  [facility];  Service: Radiology;  Laterality: N/A;  . TOTAL ABDOMINAL HYSTERECTOMY  1990    There were no vitals filed for this visit.       Subjective Assessment - 04/04/17 1112    Subjective Pt's main concern is urinary incontinence and frequency since the stroke.  She reports going around 9x/day and at night 4x.  About every 2 hours at least during the day.  Denies fecal incontinence or constipation.   Patient is accompained by: Family member  daughter   Pertinent History falls with subdural hematoma, Afib, chronic back pain, T12 compression fx, type 2 DM, OA, PNA   Limitations Other (comment)  bladder control   Patient Stated Goals to go to the bathroom less frequently   Currently in Pain? No/denies            [facility] PT Assessment - 04/04/17 0001      Assessment   Medical Diagnosis M62.89 (ICD-10-CM) - Pelvic floor dysfunction   Referring Provider DAVIS III, RONALD L   Onset Date/Surgical Date 10/05/16   Prior Therapy No     Precautions   Precautions Fall     Restrictions   Weight Bearing Restrictions No     Balance Screen   Has the patient fallen in the past 6 months Yes   How many times? 1  february - had stroke   Has the patient had a decrease in activity level because of a fear of falling?  Yes   Is the patient reluctant to leave their home because of a fear of falling?  Yes     Home Environment   Living Environment  Private residence   Living Arrangements Children   Available Help at Discharge Family   Type of Home House     Prior Function   Level of Independence Independent   Vocation Retired     IT consultant   Overall Cognitive Status Within Functional Limits for tasks assessed     Observation/Other Assessments   Focus on Therapeutic Outcomes (FOTO)  61% limited urinary problem survey     Posture/Postural Control   Posture/Postural Control Postural limitations   Postural Limitations Rounded Shoulders;Forward head;Increased thoracic kyphosis            Objective measurements completed on examination: See above findings.        Pelvic Floor Special Questions - 04/04/17 0001    Prior Pelvic/Prostate Exam Yes  history of  hysterectomy   Are you Pregnant or attempting pregnancy? No   Prior Pregnancies Yes   Number of Pregnancies 4   Number of Vaginal Deliveries 2   Any difficulty with labor and deliveries Yes  had something protrude out a long time ago   Urinary Leakage Yes   Pad use 4/day  full bladder   Activities that cause leaking With strong urge;Coughing;Sneezing   Urinary urgency Yes   Urinary frequency at least every two hours   Fecal incontinence No   Fluid intake 3 cups/day   Caffeine beverages not usually   Falling out feeling (prolapse) --  history of uterine prolapse, hysterectomy   Skin Integrity Intact   Perineal Body/Introitus  Normal   Prolapse None   Pelvic Floor Internal Exam pt and daughter informed and pt gave consent to perform internal assessment of pelvic floor   Exam Type Vaginal   Palpation tender to palpation obdurator internus and levator ani muscles left> right   Strength weak squeeze, no lift   Strength # of reps 2   Strength # of seconds 3   Tone high, weak                  PT Education - 04/04/17 2153    Education provided Yes   Education Details urge to void   Person(s) Educated Patient;Child(ren);Other (comment)  interpreter via phone call   Methods Explanation;Handout   Comprehension Verbalized understanding          PT Short Term Goals - 04/04/17 2259      Additional Short Term Goals   Additional Short Term Goals Yes     PT SHORT TERM GOAL #6   Title independent with initial HEP   Time 4   Period Weeks   Status New     PT SHORT TERM GOAL #7   Title able to perform pelvic floor contraction 3 sec hold for 10 reps due to improved muscle endurance and ability to relax pelvic floor muscles fully between reps for improved muscle function to control bladder   Time 4   Period Weeks   Status New     PT SHORT TERM GOAL #8   Title able to use urge to void techniques correctly for report of greater bladder control and 25% reduction in  urinary frequency   Time 4   Period Weeks   Status New           PT Long Term Goals - 04/04/17 2212      Additional Long Term Goals   Additional Long Term Goals Yes     PT LONG TERM GOAL #7   Title FOTO< or = to 44%limited urinary problem survey  Time 12   Period Weeks   Status New     PT LONG TERM GOAL #8   Title reduced nocturia to 2x/night   Baseline 4/night   Time 12   Period Weeks   Status New     PT LONG TERM GOAL  #9   TITLE reduced pad use to 2/day   Baseline 4/day   Time 12   Period Weeks   Status New     PT LONG TERM GOAL  #10   TITLE redued urinary frequency to void every 3 hours for less frequent trips to the bathroom and able to wait until at least 8 seconds of full stream of urine is eliminated   Time 12   Period Weeks   Status New                Plan - 04/04/17 2226    Clinical Impression Statement Patient is being seen in PT for gait and LE strength.  She presents to clinic today for pelvic floor PT due to increased urinary frequency and leakage.  Pt having urge and stress incontinence as well as frequency and nocturia up to 4x/night on average.  Pt has postural deficits including increased kyphosis and rounded shoulders.  Pt has pelvic floor muscle spasms, lack of endurance, strength, and coordination.  Pt will benefit from skilled PT to address impairments that are causing decreased bladder control.     History and Personal Factors relevant to plan of care: history of stroke   Clinical Presentation Stable   Clinical Decision Making Low   Rehab Potential Good   PT Frequency 1x / week   PT Duration 8 weeks   PT Treatment/Interventions ADLs/Self Care Home Management;Canalith Repostioning;Electrical Stimulation;DME Instruction;Gait training;Stair training;Functional mobility training;Therapeutic activities;Therapeutic exercise;Balance training;Neuromuscular re-education;Cognitive remediation;Patient/family education;Orthotic  Fit/Training;Energy conservation;Taping;Vestibular;Biofeedback;Cryotherapy;Moist Heat;Ultrasound;Manual techniques   PT Next Visit Plan STM to internal pelvic floor, stretches, breathing, ROM, pelvic floor relaxation techniques, gentle core strengthening, review urge to void and f/u with increased water intake and bladder irritants   Consulted and Agree with Plan of Care Patient;Family member/caregiver   Family Member Consulted daughter      Patient will benefit from skilled therapeutic intervention in order to improve the following deficits and impairments:  Abnormal gait, Decreased endurance, Decreased balance, Decreased cognition, Decreased mobility, Decreased strength, Difficulty walking, Dizziness, Impaired sensation, Pain, Postural dysfunction, Increased muscle spasms  Visit Diagnosis: Other muscle spasm  Muscle weakness (generalized)  Other lack of coordination  Abnormal posture      G-Codes - 04/04/17 2211    Functional Assessment Tool Used (Outpatient Only) FOTO and clinical assessment   Functional Limitation Self care   Self Care Current Status ([id number]) At least 60 percent but less than 80 percent impaired, limited or restricted   Self Care Goal Status ([id number]([id number]) At least 40 percent but less than 60 percent impaired, limited or restricted       Problem List Patient Active Problem List   Diagnosis Date Noted  . Primary osteoarthritis of left knee 02/09/2017  . SDH (subdural hematoma) (HCC)   . Urinary frequency   . Type 2 diabetes mellitus with peripheral neuropathy (HCC)   . Cough   . Gait disturbance, post-stroke 10/20/2016  . Hemiparesis affecting left side as late effect of stroke (HCC) 10/20/2016  . Essential hypertension 10/19/2016  . Hyperlipidemia LDL goal <70 10/19/2016  . 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  . Left hemiparesis (HCC)   . Atrial fibrillation (HCC)   . History  of fall   . History of traumatic subdural hematoma   . Vascular headache   .  Hypokalemia   . Leukocytosis   . Acute blood loss anemia   . Hypoalbuminemia due to protein-calorie malnutrition (HCC)   . Dysphagia, post-stroke   . Acute respiratory failure (HCC)   . Acute embolic stroke (HCC) - R putamen/caudate and R insular infarcts s/p TICI3 revascularization w/ mechanical thrombectomy d/t AF not on [facility] 10/15/2016  . Pressure injury of skin 10/15/2016  . HCAP (healthcare-associated pneumonia) 11/09/2014  . Weakness 08/25/2014  . Acute bronchitis 08/25/2014  . UTI (lower urinary tract infection) 08/25/2014  . Fracture of lumbar spine (HCC) 08/25/2014  . Subdural hematoma, post-traumatic (HCC) 05/12/2014  . Chronic atrial fibrillation (HCC) 05/12/2014  . Diabetes mellitus (HCC) 05/12/2014  . OSA on CPAP 05/12/2014    Vincente Poli, PT 04/04/2017, 11:02 PM  [facility] [address], [address] [place], [place], [address] Phone: [phone]   Fax:  [phone]  Name: Dezyre Hoefer MRN: [id number] Date of Birth: Nov 19, 1940

## 2017-04-05 ENCOUNTER — Ambulatory Visit: Payer: Medicare Other | Admitting: Physical Therapy

## 2017-04-05 ENCOUNTER — Ambulatory Visit: Payer: Medicare Other | Admitting: Occupational Therapy

## 2017-04-05 ENCOUNTER — Encounter: Payer: Self-pay | Admitting: Physical Therapy

## 2017-04-05 DIAGNOSIS — R2681 Unsteadiness on feet: Secondary | ICD-10-CM

## 2017-04-05 DIAGNOSIS — I69354 Hemiplegia and hemiparesis following cerebral infarction affecting left non-dominant side: Secondary | ICD-10-CM

## 2017-04-05 DIAGNOSIS — M6281 Muscle weakness (generalized): Secondary | ICD-10-CM | POA: Diagnosis not present

## 2017-04-05 DIAGNOSIS — R42 Dizziness and giddiness: Secondary | ICD-10-CM

## 2017-04-05 DIAGNOSIS — R296 Repeated falls: Secondary | ICD-10-CM

## 2017-04-05 DIAGNOSIS — R2689 Other abnormalities of gait and mobility: Secondary | ICD-10-CM

## 2017-04-05 NOTE — Therapy (Signed)
[facility] [address] [place], [place], [address] Phone: [phone]   Fax:  [phone]  Occupational Therapy Treatment  Patient Details  Name: [name] MRN: [id number] Date of Birth: 20-Sep-1941 Referring Provider: Dr. Letta Pate  Encounter Date: 04/05/2017      OT End of Session - 04/05/17 1100    Visit Number 15   Number of Visits 17   Date for OT Re-Evaluation 04/08/17   Authorization Type Medicare/ Medicaid   Authorization Time Period 60 days   Authorization - Visit Number 15   Authorization - Number of Visits 20   OT Start Time 1017   OT Stop Time 1055   OT Time Calculation (min) 38 min   Activity Tolerance Patient tolerated treatment well      Past Medical History:  Diagnosis Date  . Atrial fibrillation ([place])   . Chronic back pain    "mid back down into lower back" (08/25/2014)  . Frequent falls   . GERD (gastroesophageal reflux disease)   . Hypertriglyceridemia   . OSA on CPAP   . Osteoarthritis    "knees, hands, back" (08/25/2014)  . Pneumonia ~ 2000 X 1  . Subdural hematoma ([place]) july 2015   S/P fall while on Coumadin  . T12 compression fracture ([place]) 2012  . Type II diabetes mellitus ([place])     Past Surgical History:  Procedure Laterality Date  . APPENDECTOMY  2012  . CATARACT EXTRACTION W/ INTRAOCULAR LENS  IMPLANT, BILATERAL Bilateral 2000's  . IR GENERIC HISTORICAL  10/15/2016   IR PERCUTANEOUS ART THROMBECTOMY/INFUSION INTRACRANIAL INC DIAG ANGIO 10/15/2016 [name], MD [facility]INTER[facility] RAD  . IR GENERIC HISTORICAL  12/13/2016   IR RADIOLOGIST EVAL & MGMT 12/13/2016 [facility]  . RADIOLOGY WITH ANESTHESIA N/A 10/15/2016   Procedure: RADIOLOGY WITH ANESTHESIA;  Surgeon: [name], MD;  Location: [facility];  Service: Radiology;  Laterality: N/A;  . TOTAL ABDOMINAL HYSTERECTOMY  1990    There were no vitals filed for this visit.      Subjective Assessment - 04/05/17 1022     Pertinent History R MCA CVA, hx of fall with subdural hematoma, A-fib, PNA   Patient Stated Goals return to cooking   Currently in Pain? No/denies                      OT Treatments/Exercises (OP) - 04/05/17 0001      ADLs   Functional Mobility Practiced functional mobility for below task (snack/sandwich prep) and reviewed safety and fall prevention including proper use of rollator and when to not use rollator, at least one hand countertop support when retrieving items out of cabinets   Cooking Practiced making sandwich with direct supervision and min to mod cueing for safety with rollator including: proper placement, locking fully, and using countertop support when not using rollator   ADL Comments Assessed remaining STG and LTG's - see goal section for details. Reviewed progress in coordination, grip strength, and UB strength. Also reviewed safety techniues/strategies with rollator                  OT Short Term Goals - 04/05/17 1103      OT SHORT TERM GOAL #1   Title Pt/ caregiver will be I with HEP. due 03/09/17   Time 4   Period Weeks   Status Achieved     OT SHORT TERM GOAL #2   Title Pt will perform simple cooking/ home management in  standing x 25 mins without rest break or LOB.   Time 4   Period Weeks   Status Achieved  Pt still requires close supervision     OT SHORT TERM GOAL #3   Title Pt/ caregiver will verbalize understanding of compensatory stategies for short term memory deficits.   Time 4   Period Weeks   Status Achieved     OT SHORT TERM GOAL #4   Title Test standing functional reach and set goal prn   Status Deferred     OT SHORT TERM GOAL #5   Title Pt will demonstrate improved fine motor coordination for ADLS as evidenced by decreasing LUE 9 hole peg test score to 29 secs or less.   Baseline RUE 24.50 secs LUE 32.75 secs   Time 4   Period Weeks   Status Achieved  April 29, 2017: Lt = 26.22 sec.            OT Long Term Goals -  April 29, 2017 1101      OT LONG TERM GOAL #1   Title Pt will perfom mod complex cooking/ home management in standing x 45 mins without rest break or LOB. due 04/08/17   Time 8   Period Weeks   Status Not Met  Pt requires supervision and min v.c for safety for simple cooking, pt stands for 15-25 mins max     OT LONG TERM GOAL #2   Title Pt will increase bilateral grip strength to 25 lbs or greater for increased functional use for ADLs.   Time 8   Period Weeks   Status Achieved  Rt = 30 lbs, Lt = 25 lbs     OT LONG TERM GOAL #3   Title Pt will demonstrate ability to retrieve 2 lbs from overhead shelf at 125 without drops with RUE   Time 8   Period Weeks   Status Achieved  04-29-17: able to do 3 times consecutively     OT LONG TERM GOAL #4   Title Pt will demonstrate ability to retrieve 2 lbs item from ovehead shelf at 120 shoulder flexion with LUE   Time 8   Period Weeks   Status Achieved  04-29-17: able to do 3 times consecutively               Plan - Apr 29, 2017 1201    Clinical Impression Statement Pt has met 3/4 LTG's and has progressed with coordination and strength. Pt continues to need supervision for functional mobility and simple home management tasks d/t decreased attention to Lt side and cognition.    OT Treatment/Interventions Self-care/ADL training;Moist Heat;Fluidtherapy;DME and/or AE instruction;Splinting;Patient/family education;Balance training;Therapeutic exercises;Ultrasound;Therapeutic exercise;Therapeutic activities;Cognitive remediation/compensation;Passive range of motion;Functional Mobility Training;Neuromuscular education;Cryotherapy;Electrical Stimulation;Parrafin;Energy conservation;Manual Therapy   Plan D/C O.T.    Consulted and Agree with Plan of Care Patient      Patient will benefit from skilled therapeutic intervention in order to improve the following deficits and impairments:  Abnormal gait, Decreased cognition, Decreased knowledge of use of DME,  Pain, Impaired sensation, Decreased mobility, Decreased coordination, Decreased activity tolerance, Decreased endurance, Decreased range of motion, Decreased strength, Impaired UE functional use, Impaired perceived functional ability, Difficulty walking, Decreased safety awareness, Decreased knowledge of precautions, Decreased balance  Visit Diagnosis: Unsteadiness on feet  Other abnormalities of gait and mobility      G-Codes - 29-Apr-2017 1203    Functional Assessment Tool Used (Outpatient only) Requires supervision for home management tasks using rollator   Functional Limitation Self care   Self  Care Goal Status [phone]) At least 1 percent but less than 20 percent impaired, limited or restricted   Self Care Discharge Status [phone]) At least 1 percent but less than 20 percent impaired, limited or restricted      Problem List Patient Active Problem List   Diagnosis Date Noted  . Primary osteoarthritis of left knee 02/09/2017  . SDH (subdural hematoma) ([place])   . Urinary frequency   . Type 2 diabetes mellitus with peripheral neuropathy (HCC)   . Cough   . Gait disturbance, post-stroke 10/20/2016  . Hemiparesis affecting left side as late effect of stroke ([place]) 10/20/2016  . Essential hypertension 10/19/2016  . Hyperlipidemia LDL goal <70 10/19/2016  . 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 Left hemiparesis ([place])   . Atrial fibrillation ([place])   . History of fall   . History of traumatic subdural hematoma   . Vascular headache   . Hypokalemia   . Leukocytosis   . Acute blood loss anemia   . Hypoalbuminemia due to protein-calorie malnutrition ([place])   . Dysphagia, post-stroke   . Acute respiratory failure ([place])   . Acute embolic stroke (HCC) - R putamen/caudate and R insular infarcts s/p TICI3 revascularization w/ mechanical thrombectomy d/t AF not on [facility] 10/15/2016  . Pressure injury of skin 10/15/2016  . HCAP (healthcare-associated pneumonia) 11/09/2014  . Weakness 08/25/2014   . Acute bronchitis 08/25/2014  . UTI (lower urinary tract infection) 08/25/2014  . Fracture of lumbar spine ([place]) 08/25/2014  . Subdural hematoma, post-traumatic ([place]) 05/12/2014  . Chronic atrial fibrillation ([place]) 05/12/2014  . Diabetes mellitus ([place]) 05/12/2014  . OSA on CPAP 05/12/2014    OCCUPATIONAL THERAPY DISCHARGE SUMMARY  Visits from Start of Care: 15  Current functional level related to goals / functional outcomes: See above for details   Remaining deficits: Attention and awareness, specifically to Lt side Safety   Education / Equipment: HEP's, safety with use of rollator  Plan: Patient agrees to discharge.  Patient goals were partially met. Patient is being discharged due to meeting the stated rehab goals.  ?????        Carey Bullocks, OTR/L 04/05/2017, 12:04 PM  [facility] [address] [place], [place], [address] Phone: [phone]   Fax:  [phone]  Name: Cuba Natarajan MRN: [id number] Date of Birth: 29-Oct-1940

## 2017-04-05 NOTE — Therapy (Signed)
[facility] [address] [place], [place], [address] Phone: [phone]   Fax:  [phone]  Physical Therapy Treatment  Patient Details  Name: [name] MRN: [id number] Date of Birth: [birth year] Referring Provider: Myrlene Broker and Alysia Penna, MD  Encounter Date: 04/05/2017      PT End of Session - 04/05/17 1023    Visit Number 16   Number of Visits 41  per recertification   Date for PT Re-Evaluation 06/27/17  06/04/17 re-evaluation for Neuro PT   Authorization Type Medicare & G-codes with proress note every 10th visit    PT Start Time 0935   PT Stop Time 1017   PT Time Calculation (min) 42 min   Activity Tolerance Patient tolerated treatment well   Behavior During Therapy [facility] for tasks assessed/performed      Past Medical History:  Diagnosis Date  . Atrial fibrillation ([place])   . Chronic back pain    "mid back down into lower back" (08/25/2014)  . Frequent falls   . GERD (gastroesophageal reflux disease)   . Hypertriglyceridemia   . OSA on CPAP   . Osteoarthritis    "knees, hands, back" (08/25/2014)  . Pneumonia ~ 2000 X 1  . Subdural hematoma ([place]) july 2015   S/P fall while on Coumadin  . T12 compression fracture ([place]) 2012  . Type II diabetes mellitus ([place])     Past Surgical History:  Procedure Laterality Date  . APPENDECTOMY  2012  . CATARACT EXTRACTION W/ INTRAOCULAR LENS  IMPLANT, BILATERAL Bilateral 2000's  . IR GENERIC HISTORICAL  10/15/2016   IR PERCUTANEOUS ART THROMBECTOMY/INFUSION INTRACRANIAL INC DIAG ANGIO 10/15/2016 [name], MD [facility]  . IR GENERIC HISTORICAL  12/13/2016   IR RADIOLOGIST EVAL & MGMT 12/13/2016 [facility]  . RADIOLOGY WITH ANESTHESIA N/A 10/15/2016   Procedure: RADIOLOGY WITH ANESTHESIA;  Surgeon: [name], MD;  Location: [facility];  Service: Radiology;  Laterality: N/A;  . TOTAL ABDOMINAL HYSTERECTOMY  1990    There were no vitals  filed for this visit.      Subjective Assessment - 04/05/17 0940    Subjective Had appointment with pelvic floor PT; daughter not present today.  Has interpreter today.  Pt has new rollator today.   Patient is accompained by: Interpreter   Pertinent History falls with subdural hematoma, Afib, chronic back pain, T12 compression fx, type 2 DM, OA, PNA   Limitations Walking   Currently in Pain? No/denies            [facility] PT Assessment - 04/05/17 0941      Posture/Postural Control   Postural Limitations Rounded Shoulders;Forward head;Increased thoracic kyphosis   Posture Comments Performed reclined stretch over peanut physioball with UE in T position for thoracic and anterior chest opening due to collapsed/flexed posture during standing/gait; 60 seconds with therapist providing hands on assistance at anterior shoulders     Strength   Overall Strength Deficits   Overall Strength Comments Improved: RLE: 4+/5 all joints; LLE: 4/5 overall with improved pain                     [facility] Adult PT Treatment/Exercise - 04/05/17 0941      Ambulation/Gait   Ambulation/Gait Yes   Ambulation/Gait Assistance 5: Supervision   Ambulation/Gait Assistance Details 115' indoor and 500' outside with multiple verbal cues for safety with RW when descending inclines and curbs with use of hand brakes and  for increased  step length and foot clearance over uneven pavement and up inclines   Assistive device Rollator   Gait Pattern Step-to pattern;Decreased step length - right;Decreased step length - left;Decreased stance time - left;Decreased stride length;Decreased hip/knee flexion - left;Decreased dorsiflexion - left;Decreased dorsiflexion - right;Shuffle;Decreased trunk rotation;Trunk flexed;Poor foot clearance - left;Poor foot clearance - right   Ambulation Surface Level;Unlevel;Indoor;Outdoor;Paved   Stairs Yes   Stairs Assistance 5: Supervision   Stairs Assistance Details (indicate cue type  and reason) did not have cane today so performed with two rails; step to sequence due to knee pain   Stair Management Technique Two rails;Step to pattern;Forwards   Number of Stairs 8   Height of Stairs 6   Ramp 4: Min assist   Curb 4: Min assist     Self-Care   Self-Care Other Self-Care Comments   Other Self-Care Comments  Rollator adjustment to appropriate height-assessed gait over level and outdoor surfaces with adjusted height                PT Education - 04/05/17 1023    Education provided Yes   Education Details progress made, areas of continued focus for therapy, plan for recertification   Person(s) Educated Patient;Other (comment)   Methods Explanation   Comprehension Verbalized understanding          PT Short Term Goals - 04/05/17 1031      PT SHORT TERM GOAL #2   Title Patient decrease falls risk as indicated by a gait velocity of > or = 1.8 ft/sec with LRAD. (NEW TARGET DATE FOR NEW STG 05/05/17)    Baseline 1.41 with cane, 1.63 with rollator   Time 4   Period Weeks   Status New     PT SHORT TERM GOAL #3   Title Patient will demonstrate ability to ambulate >300 feet over level indoor surfaces and negotiating around indoor obstacles with LRAD with 25% cues for increased upright trunk posture, increased foot clearance/step length and perform 180 deg turns to L and R with supervision overall. (NEW TARGET DATE FOR NEW STG 05/05/17)    Time 4   Period Weeks   Status New     PT SHORT TERM GOAL #4   Title Will decrease TUG time with LRAD to < or = 28 seconds (NEW TARGET DATE FOR NEW STG 05/05/17)    Baseline 31 seconds   Time 4   Period Weeks   Status New     PT SHORT TERM GOAL #5   Title Pt will improve balance and decrease falls risk as indicated by improvement in BERG score to >or = 50/56 (NEW TARGET DATE FOR NEW STG 05/05/17)    Baseline 47/56   Time 4   Period Weeks   Status New     PT SHORT TERM GOAL #6   Title Pt will negotiate 4 stairs with one  rail and cane with step to sequence and supervision (NEW TARGET DATE FOR NEW STG 05/05/17)    Time 4   Period Weeks   Status New     PT SHORT TERM GOAL #7   Title able to perform pelvic floor contraction 3 sec hold for 10 reps due to improved muscle endurance and ability to relax pelvic floor muscles fully between reps for improved muscle function to control bladder   Time 4   Period Weeks   Status New     PT SHORT TERM GOAL #8   Title able to use urge to void  techniques correctly for report of greater bladder control and 25% reduction in urinary frequency   Time 4   Period Weeks   Status New           PT Long Term Goals - 04/05/17 1040      PT LONG TERM GOAL #1   Title Patient will demonstrate independence with HEP and verbalize options for community fitness program. (NEW TARGET DATE FOR LTG 06/04/17)   Time 8   Period Weeks   Status On-going     PT LONG TERM GOAL #2   Title Patient will improve gait velocity to >2.0 ft/sec with LRAD to indicate pt is safe for limited community ambulation. NEW TARGET DATE FOR LTG 06/04/17   Baseline 1.21f/sec with cane, 1.63 ft/sec with rollator   Time 8   Period Weeks   Status On-going     PT LONG TERM GOAL #3   Title Patient will score > or = 52/56 on the Berg Balance Test to indicate decreased falls risk. NEW TARGET DATE FOR LTG 06/04/17   Baseline 47/56 on 6/13   Time 8   Period Weeks   Status Revised     PT LONG TERM GOAL #4   Title Pt will decrease falls risk with sit <> stand and turning as indicated by TUG score of <18.  NEW TARGET DATE FOR LTG 06/04/17    Baseline 31 with cane   Time 8   Period Weeks   Status On-going     PT LONG TERM GOAL #5   Title Patient will ambulate 500 feet outside over uneven terrain including ramps/curbs with rollator safely with supervision with improved foot clearance and upright posture.  NEW TARGET DATE FOR LTG 06/04/17   Time 8   Period Weeks   Status On-going     PT LONG TERM GOAL #6    Title Pt will improve LLE strength to 4/5 overall at hip, knee and ankle   Baseline achieved   Status Achieved     PT LONG TERM GOAL #7   Title FOTO< or = to 44%limited urinary problem survey   Time 12   Period Weeks   Status New     PT LONG TERM GOAL #8   Title reduced nocturia to 2x/night   Baseline 4/night   Time 12   Period Weeks   Status New     PT LONG TERM GOAL  #9   TITLE reduced pad use to 2/day   Baseline 4/day   Time 12   Period Weeks     PT LONG TERM GOAL  #10   TITLE redued urinary frequency to void every 3 hours for less frequent trips to the bathroom and able to wait until at least 8 seconds of full stream of urine is eliminated   Time 12   Period Weeks   Status New               Plan - 04/05/17 1026    Clinical Impression Statement Treatment session focused on set up of rollator and assessment of safety and gait with new rollator indoors over level surfaces and over uneven paved surfaces outdoors.  Pt continues to require verbal cues and close supervision for safe technique/sequencing with rollator and for increased foot clearance with gait due to continued shuffling gait placing pt at increased risk for falls.  Pt's flexed/kyphotic posture also impairs pt's ability to weight shift and elongate for full hip flexion<>extension during gait sequence.  Will  continue to focus on stretches and strengthening to facilitate increased trunk ROM and strengthening for improved posture.  Pt met 2 out of 6 LTG with improved balance and LE strength.  Plan to recertify x 8 more weeks to continue to address mm weakness, impaired postural control, balance, gait and to decrease falls risk.   Rehab Potential Good   PT Frequency 3x / week  1x pelvic PT, 2x neuro PT   PT Duration 8 weeks   PT Treatment/Interventions ADLs/Self Care Home Management;Canalith Repostioning;Electrical Stimulation;DME Instruction;Gait training;Stair training;Functional mobility training;Therapeutic  activities;Therapeutic exercise;Balance training;Neuromuscular re-education;Cognitive remediation;Patient/family education;Orthotic Fit/Training;Energy conservation;Taping;Vestibular;Biofeedback;Cryotherapy;Moist Heat;Ultrasound;Manual techniques   PT Next Visit Plan Continue safety and gait training on variety of surfaces, obstacles with rollator. Posture and core mm strength!! balance and foot clearance!   Consulted and Agree with Plan of Care Patient;Family member/caregiver   Family Member Consulted daughter      Patient will benefit from skilled therapeutic intervention in order to improve the following deficits and impairments:  Abnormal gait, Decreased endurance, Decreased balance, Decreased cognition, Decreased mobility, Decreased strength, Difficulty walking, Dizziness, Impaired sensation, Pain, Postural dysfunction, Increased muscle spasms  Visit Diagnosis: Muscle weakness (generalized)  Unsteadiness on feet  Other abnormalities of gait and mobility  Hemiplegia and hemiparesis following cerebral infarction affecting left non-dominant side (HCC)  Repeated falls  Dizziness and giddiness   Problem List Patient Active Problem List   Diagnosis Date Noted  . Primary osteoarthritis of left knee 02/09/2017  . SDH (subdural hematoma) ([place])   . Urinary frequency   . Type 2 diabetes mellitus with peripheral neuropathy (HCC)   . Cough   . Gait disturbance, post-stroke 10/20/2016  . Hemiparesis affecting left side as late effect of stroke ([place]) 10/20/2016  . Essential hypertension 10/19/2016  . Hyperlipidemia LDL goal <70 10/19/2016  . 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 Left hemiparesis ([place])   . Atrial fibrillation ([facility])   . History of fall   . History of traumatic subdural hematoma   . Vascular headache   . Hypokalemia   . Leukocytosis   . Acute blood loss anemia   . Hypoalbuminemia due to protein-calorie malnutrition ([place])   . Dysphagia, post-stroke   . Acute  respiratory failure ([name])   . Acute embolic stroke (HCC) - R putamen/caudate and R insular infarcts s/p TICI3 revascularization w/ mechanical thrombectomy d/t AF not on [facility] 10/15/2016  . Pressure injury of skin 10/15/2016  . HCAP (healthcare-associated pneumonia) 11/09/2014  . Weakness 08/25/2014  . Acute bronchitis 08/25/2014  . UTI (lower urinary tract infection) 08/25/2014  . Fracture of lumbar spine ([place]) 08/25/2014  . Subdural hematoma, post-traumatic ([place]) 05/12/2014  . Chronic atrial fibrillation ([place]) 05/12/2014  . Diabetes mellitus ([place]) 05/12/2014  . OSA on CPAP 05/12/2014    Raylene Everts, PT, DPT 04/05/17    10:47 AM    [facility] [address] [place], [place], [address] Phone: [phone]   Fax:  [phone]  Name: [name] MRN: [id number] Date of Birth: 25-Aug-1941

## 2017-04-11 ENCOUNTER — Ambulatory Visit: Payer: Medicare Other | Admitting: Occupational Therapy

## 2017-04-21 ENCOUNTER — Encounter: Payer: Self-pay | Admitting: Physical Therapy

## 2017-04-21 ENCOUNTER — Ambulatory Visit: Payer: Medicare Other | Attending: Urology | Admitting: Physical Therapy

## 2017-04-21 DIAGNOSIS — R293 Abnormal posture: Secondary | ICD-10-CM

## 2017-04-21 DIAGNOSIS — R278 Other lack of coordination: Secondary | ICD-10-CM | POA: Diagnosis present

## 2017-04-21 DIAGNOSIS — M62838 Other muscle spasm: Secondary | ICD-10-CM | POA: Insufficient documentation

## 2017-04-21 DIAGNOSIS — M6281 Muscle weakness (generalized): Secondary | ICD-10-CM | POA: Diagnosis not present

## 2017-04-21 NOTE — Therapy (Signed)
[facility] [address] [place], [place], [address] Phone: [phone]   Fax:  [phone]  Physical Therapy Treatment  Patient Details  Name: [name] MRN: [id number] Date of Birth: 04-Mar-1941 Referring Provider: Esaw Dace and Claudette Laws, MD  Encounter Date: 04/21/2017      PT End of Session - 04/21/17 0934    Visit Number 17   Number of Visits 41   Date for PT Re-Evaluation 06/27/17   Authorization Type Medicare & G-codes with proress note every 10th visit    PT Start Time 0934   PT Stop Time 1012   PT Time Calculation (min) 38 min   Activity Tolerance Patient tolerated treatment well   Behavior During Therapy [facility] for tasks assessed/performed      Past Medical History:  Diagnosis Date  . Atrial fibrillation (HCC)   . Chronic back pain    "mid back down into lower back" (08/25/2014)  . Frequent falls   . GERD (gastroesophageal reflux disease)   . Hypertriglyceridemia   . OSA on CPAP   . Osteoarthritis    "knees, hands, back" (08/25/2014)  . Pneumonia ~ 2000 X 1  . Subdural hematoma (HCC) july 2015   S/P fall while on Coumadin  . T12 compression fracture (HCC) 2012  . Type II diabetes mellitus (HCC)     Past Surgical History:  Procedure Laterality Date  . APPENDECTOMY  2012  . CATARACT EXTRACTION W/ INTRAOCULAR LENS  IMPLANT, BILATERAL Bilateral 2000's  . IR GENERIC HISTORICAL  10/15/2016   IR PERCUTANEOUS ART THROMBECTOMY/INFUSION INTRACRANIAL INC DIAG ANGIO 10/15/2016 [name], MD [facility]  . IR GENERIC HISTORICAL  12/13/2016   IR RADIOLOGIST EVAL & MGMT 12/13/2016 [facility]  . RADIOLOGY WITH ANESTHESIA N/A 10/15/2016   Procedure: RADIOLOGY WITH ANESTHESIA;  Surgeon: [name], MD;  Location: [facility] OR;  Service: Radiology;  Laterality: N/A;  . TOTAL ABDOMINAL HYSTERECTOMY  1990    There were no vitals filed for this visit.      Subjective Assessment -  04/21/17 0935    Subjective working on distracting herself with the urge to void. Does better during the day than at night. Patient feels like she is able to do the pelvic floor exercise better.  She has more success without wetting herself compared to before. Able to drive from [facility] without having to go to the bathroom for first time.    Patient is accompained by: Interpreter;Family member  daughter   Pertinent History falls with subdural hematoma, Afib, chronic back pain, T12 compression fx, type 2 DM, OA, PNA   Limitations Walking   Patient Stated Goals to go to the bathroom less frequently   Currently in Pain? No/denies                         [facility] Adult PT Treatment/Exercise - 04/21/17 0001      Self-Care   Self-Care Other Self-Care Comments   Other Self-Care Comments  reviewed urge to void, instructed patient on bladder irritants and how they affect the bladder and importance of water                PT Education - 04/21/17 1009    Education provided Yes   Education Details stretches, pelvic floor strength, bladder irritants, diaphragmatic breathing   Person(s) Educated Patient;Caregiver(s);Other (comment)  interpreter   Methods Explanation;Demonstration;Tactile cues;Verbal cues;Handout   Comprehension Returned demonstration;Verbalized understanding;Verbal cues  required;Tactile cues required;Need further instruction          PT Short Term Goals - 04/21/17 [id number]      PT SHORT TERM GOAL #7   Title able to perform pelvic floor contraction 3 sec hold for 10 reps due to improved muscle endurance and ability to relax pelvic floor muscles fully between reps for improved muscle function to control bladder   Time 4   Period Weeks   Status On-going     PT SHORT TERM GOAL #8   Title able to use urge to void techniques correctly for report of greater bladder control and 25% reduction in urinary frequency   Time 4   Period Weeks   Status Achieved            PT Long Term Goals - 04/05/17 1040      PT LONG TERM GOAL #1   Title Patient will demonstrate independence with HEP and verbalize options for community fitness program. (NEW TARGET DATE FOR LTG 06/04/17)   Time 8   Period Weeks   Status On-going     PT LONG TERM GOAL #2   Title Patient will improve gait velocity to >2.0 ft/sec with LRAD to indicate pt is safe for limited community ambulation. NEW TARGET DATE FOR LTG 06/04/17   Baseline 1.3441ft/sec with cane, 1.63 ft/sec with rollator   Time 8   Period Weeks   Status On-going     PT LONG TERM GOAL #3   Title Patient will score > or = 52/56 on the Berg Balance Test to indicate decreased falls risk. NEW TARGET DATE FOR LTG 06/04/17   Baseline 47/56 on 6/13   Time 8   Period Weeks   Status Revised     PT LONG TERM GOAL #4   Title Pt will decrease falls risk with sit <> stand and turning as indicated by TUG score of <18.  NEW TARGET DATE FOR LTG 06/04/17    Baseline 31 with cane   Time 8   Period Weeks   Status On-going     PT LONG TERM GOAL #5   Title Patient will ambulate 500 feet outside over uneven terrain including ramps/curbs with rollator safely with supervision with improved foot clearance and upright posture.  NEW TARGET DATE FOR LTG 06/04/17   Time 8   Period Weeks   Status On-going     PT LONG TERM GOAL #6   Title Pt will improve LLE strength to 4/5 overall at hip, knee and ankle   Baseline achieved   Status Achieved     PT LONG TERM GOAL #7   Title FOTO< or = to 44%limited urinary problem survey   Time 12   Period Weeks   Status New     PT LONG TERM GOAL #8   Title reduced nocturia to 2x/night   Baseline 4/night   Time 12   Period Weeks   Status New     PT LONG TERM GOAL  #9   TITLE reduced pad use to 2/day   Baseline 4/day   Time 12   Period Weeks     PT LONG TERM GOAL  #10   TITLE redued urinary frequency to void every 3 hours for less frequent trips to the bathroom and able to wait until  at least 8 seconds of full stream of urine is eliminated   Time 12   Period Weeks   Status New  Plan - 04/21/17 1011    Clinical Impression Statement Patient needed verbal cues to perform diaphragmatic breathing and would not fill her abdomen all of the way. Patient will hold her breath when doing a pelvic floor contraction.  Patient does not drink many irritants.  Patient is able to sit in a car for 3.5 hours and not have to go to the bathroom for first time.  Patient has difficulty with urge to void at night when sleeping.  Patient will benefit from skilled therapy to improve pelvic floor strength for reducing urinary leakage.    Rehab Potential Good   Clinical Impairments Affecting Rehab Potential pain of chronic nature   PT Frequency 3x / week  1time pelvic floor, 2x exercise   PT Duration 8 weeks   PT Treatment/Interventions ADLs/Self Care Home Management;Canalith Repostioning;Electrical Stimulation;DME Instruction;Gait training;Stair training;Functional mobility training;Therapeutic activities;Therapeutic exercise;Balance training;Neuromuscular re-education;Cognitive remediation;Patient/family education;Orthotic Fit/Training;Energy conservation;Taping;Vestibular;Biofeedback;Cryotherapy;Moist Heat;Ultrasound;Manual techniques   PT Next Visit Plan Continue safety and gait training on variety of surfaces, obstacles with rollator. Posture and core mm strength!! balance and foot clearance! work on pelvic floor strength; internal soft tissue work, and gentle core strength.    PT Home Exercise Plan progress as needed   Consulted and Agree with Plan of Care Patient;Family member/caregiver;Other (Comment)  interpreter   Family Member Consulted daughter      Patient will benefit from skilled therapeutic intervention in order to improve the following deficits and impairments:  Abnormal gait, Decreased endurance, Decreased balance, Decreased cognition, Decreased mobility,  Decreased strength, Difficulty walking, Dizziness, Impaired sensation, Pain, Postural dysfunction, Increased muscle spasms  Visit Diagnosis: Muscle weakness (generalized)  Other muscle spasm  Other lack of coordination  Abnormal posture     Problem List Patient Active Problem List   Diagnosis Date Noted  . Primary osteoarthritis of left knee 02/09/2017  . SDH (subdural hematoma) (HCC)   . Urinary frequency   . Type 2 diabetes mellitus with peripheral neuropathy (HCC)   . Cough   . Gait disturbance, post-stroke 10/20/2016  . Hemiparesis affecting left side as late effect of stroke (HCC) 10/20/2016  . Essential hypertension 10/19/2016  . Hyperlipidemia LDL goal <70 10/19/2016  . 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  . Left hemiparesis (HCC)   . Atrial fibrillation (HCC)   . History of fall   . History of traumatic subdural hematoma   . Vascular headache   . Hypokalemia   . Leukocytosis   . Acute blood loss anemia   . Hypoalbuminemia due to protein-calorie malnutrition (HCC)   . Dysphagia, post-stroke   . Acute respiratory failure (HCC)   . Acute embolic stroke (HCC) - R putamen/caudate and R insular infarcts s/p TICI3 revascularization w/ mechanical thrombectomy d/t AF not on [facility] 10/15/2016  . Pressure injury of skin 10/15/2016  . HCAP (healthcare-associated pneumonia) 11/09/2014  . Weakness 08/25/2014  . Acute bronchitis 08/25/2014  . UTI (lower urinary tract infection) 08/25/2014  . Fracture of lumbar spine (HCC) 08/25/2014  . Subdural hematoma, post-traumatic (HCC) 05/12/2014  . Chronic atrial fibrillation (HCC) 05/12/2014  . Diabetes mellitus (HCC) 05/12/2014  . OSA on CPAP 05/12/2014    Eulis Foster, PT 04/21/17 10:22 AM   [facility] [address] [place], [place], [address] Phone: [phone]   Fax:  [phone]  Name: [name] MRN: [id number] Date of Birth: 05-18-1941

## 2017-04-21 NOTE — Patient Instructions (Addendum)
Certain foods and liquids will decrease the pH making the urine more acidic.  Urinary urgency increases when the urine has a low pH.  Most common irritants: alcohol, carbonated beverages and caffinated beverages.  Foods to avoid: apple juice, apples, ascorbic acid, canteloupes, chili, citrus fruits, coffee, cranberries, grapes, guava, peaches, pepper, pineapple, plums, strawberries, tea, tomatoes, and vinegar.  Drinking plenty of water may help to increase the pH and dilute out any of the effects of specific irritants.  Foods that are NOT irritating to the bladder include: Pears, papayas, sun-brewed teas, watermelons, non-citrus herbal teas, apricots, kava and low-acid instant drinks (Postum)  Butterfly, Supine    Lie on back, feet together. Lower knees toward floor. Hold _1 min Repeat _2__ times per session. Do __1_ sessions per day.  Copyright  VHI. All rights reserved.  HIP: Hamstrings - Supine    Place strap around foot. Raise leg up, keep knee straight. Hold _30__ seconds. _1__ reps per set, 1___ sets per day, _1__ days per week Do on both legs  Copyright  VHI. All rights reserved.    Start in a supine position, one leg bent with foot flat on the bed/floor, and the other leg crossed over the knee.  Gently push the knee of the crossed leg forward until a strong yet pain-free stretch is felt.  Hold for 30 seconds then release.  Repeat as many sets as instructed, then switch sides and repeat. 1 time each leg.   Hook-Lying    Lie with hips and knees bent. Allow body's muscles to relax. Place hands on belly. Inhale slowly making belly big and deeply for __1_ seconds, so hands move up. Then take _1__ seconds to exhale. Repeat _10__ times. Do __2_ times a day.  Copyright  VHI. All rights reserved.  Slow Contraction: Gravity Eliminated (Hook-Lying)    Lie with hips and knees bent. Slowly squeeze pelvic floor for __5_ seconds. Rest for __5_ seconds. Repeat _10__ times. Do  _3__ times a day.   Copyright  VHI. All rights reserved.  [facility] [address], [address] [place], [place] [address] Phone # [phone] Fax [phone]

## 2017-04-24 ENCOUNTER — Ambulatory Visit: Payer: Medicare Other | Attending: Physical Medicine & Rehabilitation | Admitting: Physical Therapy

## 2017-04-24 ENCOUNTER — Encounter: Payer: Self-pay | Admitting: Physical Therapy

## 2017-04-24 DIAGNOSIS — M6281 Muscle weakness (generalized): Secondary | ICD-10-CM

## 2017-04-24 DIAGNOSIS — R2689 Other abnormalities of gait and mobility: Secondary | ICD-10-CM

## 2017-04-24 DIAGNOSIS — I69354 Hemiplegia and hemiparesis following cerebral infarction affecting left non-dominant side: Secondary | ICD-10-CM | POA: Insufficient documentation

## 2017-04-24 DIAGNOSIS — R2681 Unsteadiness on feet: Secondary | ICD-10-CM | POA: Diagnosis present

## 2017-04-24 NOTE — Therapy (Signed)
[facility] [address] [place], [place], [address] Phone: [phone]   Fax:  [phone]  Physical Therapy Treatment  Patient Details  Name: [name] MRN: [id number] Date of Birth: 01/08/1941 Referring Provider: Esaw Dace and Claudette Laws, MD  Encounter Date: 04/24/2017      PT End of Session - 04/24/17 1029    Visit Number 18   Number of Visits 41   Date for PT Re-Evaluation 06/27/17   Authorization Type Medicare & G-codes with proress note every 10th visit    PT Start Time 1023  pt late for appt today   PT Stop Time 1102   PT Time Calculation (min) 39 min   Equipment Utilized During Treatment Gait belt   Activity Tolerance Patient tolerated treatment well   Behavior During Therapy WFL for tasks assessed/performed      Past Medical History:  Diagnosis Date  . Atrial fibrillation (HCC)   . Chronic back pain    "mid back down into lower back" (08/25/2014)  . Frequent falls   . GERD (gastroesophageal reflux disease)   . Hypertriglyceridemia   . OSA on CPAP   . Osteoarthritis    "knees, hands, back" (08/25/2014)  . Pneumonia ~ 2000 X 1  . Subdural hematoma (HCC) july 2015   S/P fall while on Coumadin  . T12 compression fracture (HCC) 2012  . Type II diabetes mellitus (HCC)     Past Surgical History:  Procedure Laterality Date  . APPENDECTOMY  2012  . CATARACT EXTRACTION W/ INTRAOCULAR LENS  IMPLANT, BILATERAL Bilateral 2000's  . IR GENERIC HISTORICAL  10/15/2016   IR PERCUTANEOUS ART THROMBECTOMY/INFUSION INTRACRANIAL INC DIAG ANGIO 10/15/2016 [name], MD [facility]  . IR GENERIC HISTORICAL  12/13/2016   IR RADIOLOGIST EVAL & MGMT 12/13/2016 [facility]  . RADIOLOGY WITH ANESTHESIA N/A 10/15/2016   Procedure: RADIOLOGY WITH ANESTHESIA;  Surgeon: [name], MD;  Location: [facility] OR;  Service: Radiology;  Laterality: N/A;  . TOTAL ABDOMINAL HYSTERECTOMY  1990    There  were no vitals filed for this visit.      Subjective Assessment - 04/24/17 1025    Subjective No falls, some near falls when turning around fast without support. Reports dizziness has improved overall, only occurs with bending down and quick turns at this time.    Patient is accompained by: Interpreter;Family member  interpreter and daughter   Pertinent History falls with subdural hematoma, Afib, chronic back pain, T12 compression fx, type 2 DM, OA, PNA   Limitations Walking   Patient Stated Goals to go to the bathroom less frequently   Currently in Pain? Yes   Pain Location Knee   Pain Orientation Right   Pain Descriptors / Indicators Aching;Sore   Pain Type Chronic pain   Pain Onset More than a month ago   Pain Frequency Intermittent   Aggravating Factors  increased activity   Pain Relieving Factors rest, pain medication as well            [facility] Adult PT Treatment/Exercise - 04/24/17 1030      Transfers   Transfers Sit to Stand;Stand to Sit   Sit to Stand 5: Supervision;With upper extremity assist;From bed;From chair/3-in-1   Sit to Stand Details (indicate cue type and reason) cues needed for hand placement for safety with standing   Stand to Sit 5: Supervision;With upper extremity assist;To bed;To chair/3-in-1   Stand to Sit Details cues to reach back. pt does  remember to lock rollator prior to sitting     Ambulation/Gait   Ambulation/Gait Yes   Ambulation/Gait Assistance 5: Supervision   Ambulation/Gait Assistance Details cues for rollator proximity with gait and for increased foot clearance with each step to decrease shuffling.    Ambulation Distance (Feet) 220 Feet  x1 indoors;   Assistive device Rollator   Gait Pattern Step-through pattern;Decreased stride length;Shuffle   Ambulation Surface Level;Indoor     High Level Balance   High Level Balance Activities Marching forwards;Marching backwards;Tandem walking;Side stepping  toe walking fwd/bwd   High Level  Balance Comments on floor next to counter top with single UE support: 3 laps each/each way with min guard assist and cues on posture             Balance Exercises - 04/24/17 1049      Balance Exercises: Standing   SLS with Vectors Foam/compliant surface;Other reps (comment);Limitations   Turning Right;Left;Limitations     Balance Exercises: Standing   SLS with Vectors Limitations 2 tall cones on red mats, intermittent touch to counter for balance: alternating fwd toe taps x 10 reps, alternating cross toe taps x 10 reps. min guard assist with cues on posture and weight shifting to assist with balance.   Turning Limitations on red mat with cone at both ends (long way): bil toe taps to a cone, 180 degree turn with walking to tap other cone with bil LEs. Pt performed 5 turns at both ends with min guard assist to min assist and intermittent UE touch to counter for balance             PT Short Term Goals - 04/24/17 1029      PT SHORT TERM GOAL #2   Title Patient decrease falls risk as indicated by a gait velocity of > or = 1.8 ft/sec with LRAD. (NEW TARGET DATE FOR NEW STG 05/05/17)    Baseline 1.41 with cane, 1.63 with rollator   Time 4   Period Weeks   Status New     PT SHORT TERM GOAL #3   Title Patient will demonstrate ability to ambulate >300 feet over level indoor surfaces and negotiating around indoor obstacles with LRAD with 25% cues for increased upright trunk posture, increased foot clearance/step length and perform 180 deg turns to L and R with supervision overall. (NEW TARGET DATE FOR NEW STG 05/05/17)    Time 4   Period Weeks   Status New     PT SHORT TERM GOAL #4   Title Will decrease TUG time with LRAD to < or = 28 seconds (NEW TARGET DATE FOR NEW STG 05/05/17)    Baseline 31 seconds   Time 4   Period Weeks   Status New     PT SHORT TERM GOAL #5   Title Pt will improve balance and decrease falls risk as indicated by improvement in BERG score to >or = 50/56 (NEW  TARGET DATE FOR NEW STG 05/05/17)    Baseline 47/56   Time 4   Period Weeks   Status New     PT SHORT TERM GOAL #6   Title Pt will negotiate 4 stairs with one rail and cane with step to sequence and supervision (NEW TARGET DATE FOR NEW STG 05/05/17)    Time 4   Period Weeks   Status New     PT SHORT TERM GOAL #7   Title able to perform pelvic floor contraction 3 sec hold for 10 reps  due to improved muscle endurance and ability to relax pelvic floor muscles fully between reps for improved muscle function to control bladder   Time 4   Period Weeks   Status New     PT SHORT TERM GOAL #8   Title able to use urge to void techniques correctly for report of greater bladder control and 25% reduction in urinary frequency   Time 4   Period Weeks   Status New           PT Long Term Goals - 04/05/17 1040      PT LONG TERM GOAL #1   Title Patient will demonstrate independence with HEP and verbalize options for community fitness program. (NEW TARGET DATE FOR LTG 06/04/17)   Time 8   Period Weeks   Status On-going     PT LONG TERM GOAL #2   Title Patient will improve gait velocity to >2.0 ft/sec with LRAD to indicate pt is safe for limited community ambulation. NEW TARGET DATE FOR LTG 06/04/17   Baseline 1.10ft/sec with cane, 1.63 ft/sec with rollator   Time 8   Period Weeks   Status On-going     PT LONG TERM GOAL #3   Title Patient will score > or = 52/56 on the Berg Balance Test to indicate decreased falls risk. NEW TARGET DATE FOR LTG 06/04/17   Baseline 47/56 on 6/13   Time 8   Period Weeks   Status Revised     PT LONG TERM GOAL #4   Title Pt will decrease falls risk with sit <> stand and turning as indicated by TUG score of <18.  NEW TARGET DATE FOR LTG 06/04/17    Baseline 31 with cane   Time 8   Period Weeks   Status On-going     PT LONG TERM GOAL #5   Title Patient will ambulate 500 feet outside over uneven terrain including ramps/curbs with rollator safely with  supervision with improved foot clearance and upright posture.  NEW TARGET DATE FOR LTG 06/04/17   Time 8   Period Weeks   Status On-going     PT LONG TERM GOAL #6   Title Pt will improve LLE strength to 4/5 overall at hip, knee and ankle   Baseline achieved   Status Achieved     PT LONG TERM GOAL #7   Title FOTO< or = to 44%limited urinary problem survey   Time 12   Period Weeks   Status New     PT LONG TERM GOAL #8   Title reduced nocturia to 2x/night   Baseline 4/night   Time 12   Period Weeks   Status New     PT LONG TERM GOAL  #9   TITLE reduced pad use to 2/day   Baseline 4/day   Time 12   Period Weeks     PT LONG TERM GOAL  #10   TITLE redued urinary frequency to void every 3 hours for less frequent trips to the bathroom and able to wait until at least 8 seconds of full stream of urine is eliminated   Time 12   Period Weeks   Status New            Plan - 04/24/17 1029    Clinical Impression Statement Today's skilled session continued to address gait safety with rollator and balance. Pt appropriately challenged on compliant surfaces and with single leg stance activites. Simulated the turn pt has to do at home from  her bed and dresser where her daughter reports she is most off balance as she does not use an AD with moving between these. Up to minimal assistance needed with simulated task in session today. Continued work on this is needed. Pt should benefit from continued PT to progress toward unmet goals.   Rehab Potential Good   Clinical Impairments Affecting Rehab Potential pain of chronic nature   PT Frequency 3x / week  1time pelvic floor, 2x exercise   PT Duration 8 weeks   PT Treatment/Interventions ADLs/Self Care Home Management;Canalith Repostioning;Electrical Stimulation;DME Instruction;Gait training;Stair training;Functional mobility training;Therapeutic activities;Therapeutic exercise;Balance training;Neuromuscular re-education;Cognitive  remediation;Patient/family education;Orthotic Fit/Training;Energy conservation;Taping;Vestibular;Biofeedback;Cryotherapy;Moist Heat;Ultrasound;Manual techniques   PT Next Visit Plan Continue safety and gait training on variety of surfaces, obstacles with rollator. Posture and core mm strength!! balance and foot clearance! work on pelvic floor strength; internal soft tissue work, and gentle core strength.    PT Home Exercise Plan progress as needed   Consulted and Agree with Plan of Care Patient;Family member/caregiver;Other (Comment)  interpreter   Family Member Consulted daughter      Patient will benefit from skilled therapeutic intervention in order to improve the following deficits and impairments:  Abnormal gait, Decreased endurance, Decreased balance, Decreased cognition, Decreased mobility, Decreased strength, Difficulty walking, Dizziness, Impaired sensation, Pain, Postural dysfunction, Increased muscle spasms  Visit Diagnosis: Muscle weakness (generalized)  Unsteadiness on feet  Other abnormalities of gait and mobility  Hemiplegia and hemiparesis following cerebral infarction affecting left non-dominant side [facility](HCC)     Problem List Patient Active Problem List   Diagnosis Date Noted  . Primary osteoarthritis of left knee 02/09/2017  . SDH (subdural hematoma) (HCC)   . Urinary frequency   . Type 2 diabetes mellitus with peripheral neuropathy (HCC)   . Cough   . Gait disturbance, post-stroke 10/20/2016  . Hemiparesis affecting left side as late effect of stroke (HCC) 10/20/2016  . Essential hypertension 10/19/2016  . Hyperlipidemia LDL goal <70 10/19/2016  . 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  . Left hemiparesis (HCC)   . Atrial fibrillation (HCC)   . History of fall   . History of traumatic subdural hematoma   . Vascular headache   . Hypokalemia   . Leukocytosis   . Acute blood loss anemia   . Hypoalbuminemia due to protein-calorie malnutrition (HCC)   .  Dysphagia, post-stroke   . Acute respiratory failure (HCC)   . Acute embolic stroke (HCC) - R putamen/caudate and R insular infarcts s/p TICI3 revascularization w/ mechanical thrombectomy d/[facility] 10/15/2016  . Pressure injury of skin 10/15/2016  . HCAP (healthcare-associated pneumonia) 11/09/2014  . Weakness 08/25/2014  . Acute bronchitis 08/25/2014  . UTI (lower urinary tract infection) 08/25/2014  . Fracture of lumbar spine (HCC) 08/25/2014  . Subdural hematoma, post-traumatic (HCC) 05/12/2014  . Chronic atrial fibrillation (HCC) 05/12/2014  . Diabetes mellitus (HCC) 05/12/2014  . OSA on CPAP 05/12/2014    Sallyanne KusterKathy Kamron Portee, PTA, [facility] [address], [address] [place], [place] [id number] [phone] 04/25/17, 8:34 AM   Name: [name] MRN: [id number] Date of Birth: 05/28/1941

## 2017-04-26 ENCOUNTER — Ambulatory Visit: Payer: Medicare Other

## 2017-04-26 ENCOUNTER — Encounter: Payer: Self-pay | Admitting: Physical Therapy

## 2017-04-26 ENCOUNTER — Ambulatory Visit: Payer: Medicare Other | Admitting: Physical Therapy

## 2017-04-26 DIAGNOSIS — R2689 Other abnormalities of gait and mobility: Secondary | ICD-10-CM

## 2017-04-26 DIAGNOSIS — R278 Other lack of coordination: Secondary | ICD-10-CM

## 2017-04-26 DIAGNOSIS — M6281 Muscle weakness (generalized): Secondary | ICD-10-CM

## 2017-04-26 DIAGNOSIS — M62838 Other muscle spasm: Secondary | ICD-10-CM

## 2017-04-26 DIAGNOSIS — R2681 Unsteadiness on feet: Secondary | ICD-10-CM

## 2017-04-26 NOTE — Therapy (Signed)
[facility] [address] [place], [place], [address] Phone: [phone]   Fax:  [phone]  Physical Therapy Treatment  Patient Details  Name: [name] MRN: [id number] Date of Birth: August 10, 1941 Referring Provider: Esaw Dace and Claudette Laws, MD  Encounter Date: 04/26/2017      PT End of Session - 04/26/17 1240    Visit Number 20   Number of Visits 41   Date for PT Re-Evaluation 06/27/17   Authorization Type Medicare & G-codes with proress note every 10th visit    PT Start Time 1146   PT Stop Time 1229   PT Time Calculation (min) 43 min   Equipment Utilized During Treatment --  min guard to min A prn   Activity Tolerance Patient tolerated treatment well   Behavior During Therapy [facility] for tasks assessed/performed      Past Medical History:  Diagnosis Date  . Atrial fibrillation (HCC)   . Chronic back pain    "mid back down into lower back" (08/25/2014)  . Frequent falls   . GERD (gastroesophageal reflux disease)   . Hypertriglyceridemia   . OSA on CPAP   . Osteoarthritis    "knees, hands, back" (08/25/2014)  . Pneumonia ~ 2000 X 1  . Subdural hematoma (HCC) july 2015   S/P fall while on Coumadin  . T12 compression fracture (HCC) 2012  . Type II diabetes mellitus (HCC)     Past Surgical History:  Procedure Laterality Date  . APPENDECTOMY  2012  . CATARACT EXTRACTION W/ INTRAOCULAR LENS  IMPLANT, BILATERAL Bilateral 2000's  . IR GENERIC HISTORICAL  10/15/2016   IR PERCUTANEOUS ART THROMBECTOMY/INFUSION INTRACRANIAL INC DIAG ANGIO 10/15/2016 [name], MD [facility]INTERV RAD  . IR GENERIC HISTORICAL  12/13/2016   IR RADIOLOGIST EVAL & MGMT 12/13/2016 [facility]  . RADIOLOGY WITH ANESTHESIA N/A 10/15/2016   Procedure: RADIOLOGY WITH ANESTHESIA;  Surgeon: [name], MD;  Location: [facility] OR;  Service: Radiology;  Laterality: N/A;  . TOTAL ABDOMINAL HYSTERECTOMY  1990    There were no  vitals filed for this visit.      Subjective Assessment - 04/26/17 1150    Subjective Pt denied falls or changes since last visit. However, pt did have near fall when raising both arms when shooing dog out the door.    Patient is accompained by: Interpreter;Family member  dtr and Y'Hin(interpreter)   Pertinent History falls with subdural hematoma, Afib, chronic back pain, T12 compression fx, type 2 DM, OA, PNA   Patient Stated Goals to go to the bathroom less frequently   Currently in Pain? No/denies                         Balance Exercises - 04/26/17 1237      Balance Exercises: Standing   Standing Eyes Opened Wide (BOA);Narrow base of support (BOS);Head turns;Foam/compliant surface;Solid surface;3 reps;Other reps (comment);30 secs;10 secs  10 reps with arm movement (shoulder flexion)   Standing Eyes Closed Narrow base of support (BOS);Wide (BOA);Solid surface;Foam/compliant surface;Head turns;10 secs;30 secs   SLS Eyes open;Solid surface;Intermittent upper extremity support;3 reps;10 secs   Wall Bumps Hip   Wall Bumps-Hips Eyes opened;Anterior/posterior;10 reps   Other Standing Exercises Pt also performed ant/post/lat weight shifting x10 reps. All activities performed with cues and demo for technique. Please see pt instructions for HEP details.            PT Education - 04/26/17 1239  Education provided Yes   Education Details PT provided pt with balance HEP.   Person(s) Educated Patient;Child(ren);Other (comment)  interpreter   Methods Explanation;Demonstration;Tactile cues;Verbal cues;Handout   Comprehension Returned demonstration;Verbalized understanding          PT Short Term Goals - 04/24/17 1029      PT SHORT TERM GOAL #2   Title Patient decrease falls risk as indicated by a gait velocity of > or = 1.8 ft/sec with LRAD. (NEW TARGET DATE FOR NEW STG 05/05/17)    Baseline 1.41 with cane, 1.63 with rollator   Time 4   Period Weeks   Status New      PT SHORT TERM GOAL #3   Title Patient will demonstrate ability to ambulate >300 feet over level indoor surfaces and negotiating around indoor obstacles with LRAD with 25% cues for increased upright trunk posture, increased foot clearance/step length and perform 180 deg turns to L and R with supervision overall. (NEW TARGET DATE FOR NEW STG 05/05/17)    Time 4   Period Weeks   Status New     PT SHORT TERM GOAL #4   Title Will decrease TUG time with LRAD to < or = 28 seconds (NEW TARGET DATE FOR NEW STG 05/05/17)    Baseline 31 seconds   Time 4   Period Weeks   Status New     PT SHORT TERM GOAL #5   Title Pt will improve balance and decrease falls risk as indicated by improvement in BERG score to >or = 50/56 (NEW TARGET DATE FOR NEW STG 05/05/17)    Baseline 47/56   Time 4   Period Weeks   Status New     PT SHORT TERM GOAL #6   Title Pt will negotiate 4 stairs with one rail and cane with step to sequence and supervision (NEW TARGET DATE FOR NEW STG 05/05/17)    Time 4   Period Weeks   Status New     PT SHORT TERM GOAL #7   Title able to perform pelvic floor contraction 3 sec hold for 10 reps due to improved muscle endurance and ability to relax pelvic floor muscles fully between reps for improved muscle function to control bladder   Time 4   Period Weeks   Status New     PT SHORT TERM GOAL #8   Title able to use urge to void techniques correctly for report of greater bladder control and 25% reduction in urinary frequency   Time 4   Period Weeks   Status New           PT Long Term Goals - 04/26/17 1037      PT LONG TERM GOAL #7   Title FOTO< or = to 44%limited urinary problem survey   Time 12   Period Weeks   Status On-going     PT LONG TERM GOAL #8   Title reduced nocturia to 2x/night   Baseline 4/night   Time 12   Period Weeks   Status On-going     PT LONG TERM GOAL  #9   TITLE reduced pad use to 2/day   Baseline using less but that is medication   Time  12   Period Weeks   Status On-going     PT LONG TERM GOAL  #10   TITLE redued urinary frequency to void every 3 hours for less frequent trips to the bathroom and able to wait until at least 8 seconds of full  stream of urine is eliminated   Time 12   Period Weeks   Status On-going               Plan - 05-05-2017 1247    Clinical Impression Statement Today's skilled session focused on balance activities to improve hip strategy, weight shifting, and righting reaction during LOB. Pt experienced LOB and incr. postural sway during SLS activities, dynamic standing activities on compliant surfaces and during ant/post weight shifting. PT provided pt with HEP to address balance issues, and pt would continue to benefit from skilled PT to improve safety during functional mobility.    Rehab Potential Good   Clinical Impairments Affecting Rehab Potential pain of chronic nature   PT Frequency 3x / week  1time pelvic floor, 2x exercise   PT Duration 8 weeks   PT Treatment/Interventions ADLs/Self Care Home Management;Canalith Repostioning;Electrical Stimulation;DME Instruction;Gait training;Stair training;Functional mobility training;Therapeutic activities;Therapeutic exercise;Balance training;Neuromuscular re-education;Cognitive remediation;Patient/family education;Orthotic Fit/Training;Energy conservation;Taping;Vestibular;Biofeedback;Cryotherapy;Moist Heat;Ultrasound;Manual techniques   PT Next Visit Plan Continue safety and gait training on variety of surfaces, obstacles with rollator. Posture and core mm strength!! balance and foot clearance! work on pelvic floor strength; internal soft tissue work, and gentle core strength.    PT Home Exercise Plan progress as needed   Consulted and Agree with Plan of Care Patient;Family member/caregiver;Other (Comment)  interpreter   Family Member Consulted daughter      Patient will benefit from skilled therapeutic intervention in order to improve the  following deficits and impairments:  Abnormal gait, Decreased endurance, Decreased balance, Decreased cognition, Decreased mobility, Decreased strength, Difficulty walking, Dizziness, Impaired sensation, Pain, Postural dysfunction, Increased muscle spasms  Visit Diagnosis: Unsteadiness on feet  Other abnormalities of gait and mobility       G-Codes - 2017-05-05 1240    Functional Assessment Tool Used (Outpatient Only) clinical assessment (pt required balance HEP and min guard to min A during dynamic and static balance activities)   Functional Limitation Mobility: Walking and moving around   Mobility: Walking and Moving Around Current Status (Z6109) At least 1 percent but less than 20 percent impaired, limited or restricted   Mobility: Walking and Moving Around Goal Status [phone]) At least 1 percent but less than 20 percent impaired, limited or restricted      Problem List Patient Active Problem List   Diagnosis Date Noted  . Primary osteoarthritis of left knee 02/09/2017  . SDH (subdural hematoma) (HCC)   . Urinary frequency   . Type 2 diabetes mellitus with peripheral neuropathy (HCC)   . Cough   . Gait disturbance, post-stroke 10/20/2016  . Hemiparesis affecting left side as late effect of stroke (HCC) 10/20/2016  . Essential hypertension 10/19/2016  . Hyperlipidemia LDL goal <70 10/19/2016  . 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  . Left hemiparesis (HCC)   . Atrial fibrillation (HCC)   . History of fall   . History of traumatic subdural hematoma   . Vascular headache   . Hypokalemia   . Leukocytosis   . Acute blood loss anemia   . Hypoalbuminemia due to protein-calorie malnutrition (HCC)   . Dysphagia, post-stroke   . Acute respiratory failure (HCC)   . Acute embolic stroke (HCC) - R putamen/caudate and R insular infarcts s/p TICI3 revascularization w/ mechanical thrombectomy d/t AF not on [facility] 10/15/2016  . Pressure injury of skin 10/15/2016  . HCAP  (healthcare-associated pneumonia) 11/09/2014  . Weakness 08/25/2014  . Acute bronchitis 08/25/2014  . UTI (lower urinary tract infection) 08/25/2014  . Fracture  of lumbar spine (HCC) 08/25/2014  . Subdural hematoma, post-traumatic (HCC) 05/12/2014  . Chronic atrial fibrillation (HCC) 05/12/2014  . Diabetes mellitus (HCC) 05/12/2014  . OSA on CPAP 05/12/2014    Beaumont Austad L 04/26/2017, 12:49 PM  [facility] [address] [place], [place], [address] Phone: [phone]   Fax:  [phone]  Name: [name] MRN: [id number] Date of Birth: 06-01-1941  Physical Therapy Progress Note  Dates of Reporting Period: 03/20/17 to 04/26/17  Objective Reports of Subjective Statement: see above  Objective Measurements: see previous note  Goal Update:      PT Short Term Goals - 04/24/17 1029      PT SHORT TERM GOAL #2   Title Patient decrease falls risk as indicated by a gait velocity of > or = 1.8 ft/sec with LRAD. (NEW TARGET DATE FOR NEW STG 05/05/17)    Baseline 1.41 with cane, 1.63 with rollator   Time 4   Period Weeks   Status New     PT SHORT TERM GOAL #3   Title Patient will demonstrate ability to ambulate >300 feet over level indoor surfaces and negotiating around indoor obstacles with LRAD with 25% cues for increased upright trunk posture, increased foot clearance/step length and perform 180 deg turns to L and R with supervision overall. (NEW TARGET DATE FOR NEW STG 05/05/17)    Time 4   Period Weeks   Status New     PT SHORT TERM GOAL #4   Title Will decrease TUG time with LRAD to < or = 28 seconds (NEW TARGET DATE FOR NEW STG 05/05/17)    Baseline 31 seconds   Time 4   Period Weeks   Status New     PT SHORT TERM GOAL #5   Title Pt will improve balance and decrease falls risk as indicated by improvement in BERG score to >or = 50/56 (NEW TARGET DATE FOR NEW STG 05/05/17)    Baseline 47/56   Time 4   Period Weeks    Status New     PT SHORT TERM GOAL #6   Title Pt will negotiate 4 stairs with one rail and cane with step to sequence and supervision (NEW TARGET DATE FOR NEW STG 05/05/17)    Time 4   Period Weeks   Status New     PT SHORT TERM GOAL #7   Title able to perform pelvic floor contraction 3 sec hold for 10 reps due to improved muscle endurance and ability to relax pelvic floor muscles fully between reps for improved muscle function to control bladder   Time 4   Period Weeks   Status New     PT SHORT TERM GOAL #8   Title able to use urge to void techniques correctly for report of greater bladder control and 25% reduction in urinary frequency   Time 4   Period Weeks   Status New         PT Long Term Goals - 04/26/17 1037      PT LONG TERM GOAL #7   Title FOTO< or = to 44%limited urinary problem survey   Time 12   Period Weeks   Status On-going     PT LONG TERM GOAL #8   Title reduced nocturia to 2x/night   Baseline 4/night   Time 12   Period Weeks   Status On-going     PT LONG TERM GOAL  #9   TITLE reduced pad use to  2/day   Baseline using less but that is medication   Time 12   Period Weeks   Status On-going     PT LONG TERM GOAL  #10   TITLE redued urinary frequency to void every 3 hours for less frequent trips to the bathroom and able to wait until at least 8 seconds of full stream of urine is eliminated   Time 12   Period Weeks   Status On-going       Plan: Continue to address balance, strength, and flexibility deficits.   Reason Skilled Services are Required: To improve safety during functional mobility and incontinence.   Zerita Boers, PT,DPT 04/26/17 12:50 PM Phone: [phone] Fax: [phone]

## 2017-04-26 NOTE — Therapy (Signed)
[facility] [address] [place], [place], [address] Phone: [phone]   Fax:  [phone]  Physical Therapy Treatment  Patient Details  Name: [name] MRN: [id number] Date of Birth: [birth year] Referring Provider: Esaw Dace and Claudette Laws, MD  Encounter Date: 04/26/2017      PT End of Session - 04/26/17 1240    Visit Number 19   Number of Visits 41   Date for PT Re-Evaluation 06/27/17   Authorization Type Medicare & G-codes with proress note every 10th visit    PT Start Time 1020   PT Stop Time 1059   PT Time Calculation (min) 39 min   Equipment Utilized During Treatment --  min guard to min A prn   Activity Tolerance Patient tolerated treatment well   Behavior During Therapy [facility] for tasks assessed/performed      Past Medical History:  Diagnosis Date  . Atrial fibrillation (HCC)   . Chronic back pain    "mid back down into lower back" (08/25/2014)  . Frequent falls   . GERD (gastroesophageal reflux disease)   . Hypertriglyceridemia   . OSA on CPAP   . Osteoarthritis    "knees, hands, back" (08/25/2014)  . Pneumonia ~ 2000 X 1  . Subdural hematoma (HCC) july 2015   S/P fall while on Coumadin  . T12 compression fracture (HCC) 2012  . Type II diabetes mellitus (HCC)     Past Surgical History:  Procedure Laterality Date  . APPENDECTOMY  2012  . CATARACT EXTRACTION W/ INTRAOCULAR LENS  IMPLANT, BILATERAL Bilateral 2000's  . IR GENERIC HISTORICAL  10/15/2016   IR PERCUTANEOUS ART THROMBECTOMY/INFUSION INTRACRANIAL INC DIAG ANGIO 10/15/2016 [name], MD [facility]INTER[facility] RAD  . IR GENERIC HISTORICAL  12/13/2016   IR RADIOLOGIST EVAL & MGMT 12/13/2016 [facility]  . RADIOLOGY WITH ANESTHESIA N/A 10/15/2016   Procedure: RADIOLOGY WITH ANESTHESIA;  Surgeon: [name], MD;  Location: [facility] OR;  Service: Radiology;  Laterality: N/A;  . TOTAL ABDOMINAL HYSTERECTOMY  1990    There were  no vitals filed for this visit.      Subjective Assessment - 04/26/17 1821    Subjective Tried to stop the medication yesterday and woke up 4-5x last night.     Patient is accompained by: Interpreter;Family member  daughter   Pertinent History falls with subdural hematoma, Afib, chronic back pain, T12 compression fx, type 2 DM, OA, PNA   Patient Stated Goals to go to the bathroom less frequently   Currently in Pain? No/denies                         [facility] Adult PT Treatment/Exercise - 04/26/17 0001      Neuro Re-ed    Neuro Re-ed Details  tactile cues for pelvic floor contractions with bridges and pelvic tilt, pelvic tilt with cue to reduce lumbar contraction     Knee/Hip Exercises: Stretches   Active Hamstring Stretch 3 reps;30 seconds   Piriformis Stretch 3 reps;30 seconds   Other Knee/Hip Stretches butterfly stretch     Knee/Hip Exercises: Supine   Other Supine Knee/Hip Exercises ball squeeze 5 sec hold x 10   Other Supine Knee/Hip Exercises mini bridge with pelvic floor contraction     Knee/Hip Exercises: Sidelying   Hip ADduction Strengthening;Both;5 reps  5 sec hold with pelvic floor contract  PT Education - 04/26/17 1824    Education provided Yes   Education Details HEP   Person(s) Educated Patient;Caregiver(s);Other (comment)  interpreter adn daughter   Methods Explanation;Demonstration;Tactile cues;Verbal cues;Handout   Comprehension Verbalized understanding;Returned demonstration          PT Short Term Goals - 04/24/17 1029      PT SHORT TERM GOAL #2   Title Patient decrease falls risk as indicated by a gait velocity of > or = 1.8 ft/sec with LRAD. (NEW TARGET DATE FOR NEW STG 05/05/17)    Baseline 1.41 with cane, 1.63 with rollator   Time 4   Period Weeks   Status New     PT SHORT TERM GOAL #3   Title Patient will demonstrate ability to ambulate >300 feet over level indoor surfaces and negotiating around  indoor obstacles with LRAD with 25% cues for increased upright trunk posture, increased foot clearance/step length and perform 180 deg turns to L and R with supervision overall. (NEW TARGET DATE FOR NEW STG 05/05/17)    Time 4   Period Weeks   Status New     PT SHORT TERM GOAL #4   Title Will decrease TUG time with LRAD to < or = 28 seconds (NEW TARGET DATE FOR NEW STG 05/05/17)    Baseline 31 seconds   Time 4   Period Weeks   Status New     PT SHORT TERM GOAL #5   Title Pt will improve balance and decrease falls risk as indicated by improvement in BERG score to >or = 50/56 (NEW TARGET DATE FOR NEW STG 05/05/17)    Baseline 47/56   Time 4   Period Weeks   Status New     PT SHORT TERM GOAL #6   Title Pt will negotiate 4 stairs with one rail and cane with step to sequence and supervision (NEW TARGET DATE FOR NEW STG 05/05/17)    Time 4   Period Weeks   Status New     PT SHORT TERM GOAL #7   Title able to perform pelvic floor contraction 3 sec hold for 10 reps due to improved muscle endurance and ability to relax pelvic floor muscles fully between reps for improved muscle function to control bladder   Time 4   Period Weeks   Status New     PT SHORT TERM GOAL #8   Title able to use urge to void techniques correctly for report of greater bladder control and 25% reduction in urinary frequency   Time 4   Period Weeks   Status New           PT Long Term Goals - 04/26/17 1037      PT LONG TERM GOAL #7   Title FOTO< or = to 44%limited urinary problem survey   Time 12   Period Weeks   Status On-going     PT LONG TERM GOAL #8   Title reduced nocturia to 2x/night   Baseline 4/night   Time 12   Period Weeks   Status On-going     PT LONG TERM GOAL  #9   TITLE reduced pad use to 2/day   Baseline using less but that is medication   Time 12   Period Weeks   Status On-going     PT LONG TERM GOAL  #10   TITLE redued urinary frequency to void every 3 hours for less frequent  trips to the bathroom and able to wait until at  least 8 seconds of full stream of urine is eliminated   Time 12   Period Weeks   Status On-going               Plan - 05-03-17 1825    Clinical Impression Statement Patient was able to progress pelvic floor and low abdominal strengthening. Pt needed tactile cues for contracting both pelvic floor and abdominal. She was able to perform exercises correctly with pelvic contraction, but has difficulty with abdominal contraction and tends to use lumbar muscles. Pt will benefit from skilled therapy to continue working on improved pelvic floor muscle control, strength and coordination   Rehab Potential Good   PT Treatment/Interventions ADLs/Self Care Home Management;Canalith Repostioning;Electrical Stimulation;DME Instruction;Gait training;Stair training;Functional mobility training;Therapeutic activities;Therapeutic exercise;Balance training;Neuromuscular re-education;Cognitive remediation;Patient/family education;Orthotic Fit/Training;Energy conservation;Taping;Vestibular;Biofeedback;Cryotherapy;Moist Heat;Ultrasound;Manual techniques   PT Next Visit Plan continue pelvic floor, core and glute strength, f/u with new exercise and with bladder control without medicine, internal soft tissue work, biofeedback   Consulted and Agree with Plan of Care Patient;Family member/caregiver;Other (Comment)   Family Member Consulted daughter      Patient will benefit from skilled therapeutic intervention in order to improve the following deficits and impairments:  Abnormal gait, Decreased endurance, Decreased balance, Decreased cognition, Decreased mobility, Decreased strength, Difficulty walking, Dizziness, Impaired sensation, Pain, Postural dysfunction, Increased muscle spasms  Visit Diagnosis: Other muscle spasm  Other lack of coordination  Muscle weakness (generalized)       G-Codes - 2017/05/03 1240    Functional Assessment Tool Used (Outpatient Only)  clinical assessment (pt required balance HEP and min guard to min A during dynamic and static balance activities)   Functional Limitation Mobility: Walking and moving around   Mobility: Walking and Moving Around Current Status (Z6109) At least 1 percent but less than 20 percent impaired, limited or restricted   Mobility: Walking and Moving Around Goal Status [phone]) At least 1 percent but less than 20 percent impaired, limited or restricted      Problem List Patient Active Problem List   Diagnosis Date Noted  . Primary osteoarthritis of left knee 02/09/2017  . SDH (subdural hematoma) (HCC)   . Urinary frequency   . Type 2 diabetes mellitus with peripheral neuropathy (HCC)   . Cough   . Gait disturbance, post-stroke 10/20/2016  . Hemiparesis affecting left side as late effect of stroke (HCC) 10/20/2016  . Essential hypertension 10/19/2016  . Hyperlipidemia LDL goal <70 10/19/2016  . 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  . Left hemiparesis (HCC)   . Atrial fibrillation (HCC)   . History of fall   . History of traumatic subdural hematoma   . Vascular headache   . Hypokalemia   . Leukocytosis   . Acute blood loss anemia   . Hypoalbuminemia due to protein-calorie malnutrition (HCC)   . Dysphagia, post-stroke   . Acute respiratory failure (HCC)   . Acute embolic stroke (HCC) - R putamen/caudate and R insular infarcts s/p TICI3 revascularization w/ mechanical thrombectomy d/t AF not on [facility] 10/15/2016  . Pressure injury of skin 10/15/2016  . HCAP (healthcare-associated pneumonia) 11/09/2014  . Weakness 08/25/2014  . Acute bronchitis 08/25/2014  . UTI (lower urinary tract infection) 08/25/2014  . Fracture of lumbar spine (HCC) 08/25/2014  . Subdural hematoma, post-traumatic (HCC) 05/12/2014  . Chronic atrial fibrillation (HCC) 05/12/2014  . Diabetes mellitus (HCC) 05/12/2014  . OSA on CPAP 05/12/2014    Vincente Poli, PT 05-03-17, 6:27 PM  [facility] [address]  [address] [place], [place], [address] Phone: [phone]   Fax:  [phone]  Name: [name] MRN: [id number] Date of Birth: 12/25/1940

## 2017-04-26 NOTE — Patient Instructions (Signed)
Knee to Chest    Lying supine, bend involved knee to chest Hold 10 sec, repeat__5_ times. Repeat with other leg. Do __1_ times per day.  Copyright  VHI. All rights reserved.   Bridge    Lie back, legs bent. Hold ball or pillow between knees.  Inhale, pressing hips up. Keeping ribs in, lengthen lower back. Exhale, rolling down along spine from top. Repeat __10__ times. Hold 3 seconds. Do __1__ sessions per day.  [email or web address].exer.[email or web address]   Copyright  VHI. All rights reserved.

## 2017-04-28 ENCOUNTER — Ambulatory Visit: Payer: Medicare Other

## 2017-05-01 ENCOUNTER — Ambulatory Visit: Payer: Medicare Other | Admitting: Physical Therapy

## 2017-05-01 ENCOUNTER — Encounter: Payer: Self-pay | Admitting: Physical Therapy

## 2017-05-01 DIAGNOSIS — R278 Other lack of coordination: Secondary | ICD-10-CM

## 2017-05-01 DIAGNOSIS — M6281 Muscle weakness (generalized): Secondary | ICD-10-CM | POA: Diagnosis not present

## 2017-05-01 DIAGNOSIS — M62838 Other muscle spasm: Secondary | ICD-10-CM

## 2017-05-01 DIAGNOSIS — R2689 Other abnormalities of gait and mobility: Secondary | ICD-10-CM

## 2017-05-01 DIAGNOSIS — R2681 Unsteadiness on feet: Secondary | ICD-10-CM

## 2017-05-01 NOTE — Patient Instructions (Signed)
   BALL SQUEEZE - SEATED  While sitting, place a ball between your knees. Squeeze the ball with your knees and hold 5 seconds Relax 10 seconds. Repeat 5 x Do 2x/day    Seated Diaphragmatic breathing  Sit with your back resting against a chair. Place one hand on your chest and another on your stomach. Breath in through your nose making your stomach rise. Slowly breath out through your mouth.  FOCUS on keeping shoulders and chest relax feel upper back and relax shoulders like they are a shirt hanging on a hanger Do 5 min/day

## 2017-05-01 NOTE — Therapy (Signed)
[facility] [address] [place], [place], [address] Phone: [phone]   Fax:  [phone]  Physical Therapy Treatment  Patient Details  Name: [name] MRN: [id number] Date of Birth: October 13, 1941 Referring Provider: Esaw Dace and Claudette Laws, MD  Encounter Date: 05/01/2017      PT End of Session - 05/01/17 1708    Visit Number 22   Number of Visits 41   Date for PT Re-Evaluation 05/05/17   Authorization Type Medicare & G-codes with proress note every 10th visit    PT Start Time 1316   PT Stop Time 1359   PT Time Calculation (min) 43 min   Equipment Utilized During Treatment Gait belt   Activity Tolerance Patient tolerated treatment well   Behavior During Therapy WFL for tasks assessed/performed      Past Medical History:  Diagnosis Date  . Atrial fibrillation (HCC)   . Chronic back pain    "mid back down into lower back" (08/25/2014)  . Frequent falls   . GERD (gastroesophageal reflux disease)   . Hypertriglyceridemia   . OSA on CPAP   . Osteoarthritis    "knees, hands, back" (08/25/2014)  . Pneumonia ~ 2000 X 1  . Subdural hematoma (HCC) july 2015   S/P fall while on Coumadin  . T12 compression fracture (HCC) 2012  . Type II diabetes mellitus (HCC)     Past Surgical History:  Procedure Laterality Date  . APPENDECTOMY  2012  . CATARACT EXTRACTION W/ INTRAOCULAR LENS  IMPLANT, BILATERAL Bilateral 2000's  . IR GENERIC HISTORICAL  10/15/2016   IR PERCUTANEOUS ART THROMBECTOMY/INFUSION INTRACRANIAL INC DIAG ANGIO 10/15/2016 [name], MD [facility]  . IR GENERIC HISTORICAL  12/13/2016   IR RADIOLOGIST EVAL & MGMT 12/13/2016 [facility]  . RADIOLOGY WITH ANESTHESIA N/A 10/15/2016   Procedure: RADIOLOGY WITH ANESTHESIA;  Surgeon: [name], MD;  Location: [facility] OR;  Service: Radiology;  Laterality: N/A;  . TOTAL ABDOMINAL HYSTERECTOMY  1990    There were no vitals filed for  this visit.      05/01/17 1308  Symptoms/Limitations  Subjective Pt reports she is feeling good today. No falls reported.  Patient is accompained by: Family member;Interpreter  Pertinent History falls with subdural hematoma, Afib, chronic back pain, T12 compression fx, type 2 DM, OA, PNA  Limitations Walking  Patient Stated Goals to go to the bathroom less frequently  Pain Assessment  Currently in Pain? Yes  Pain Score 3  Pain Location Leg  Pain Orientation Right;Left  Pain Descriptors / Indicators Sore  Pain Type Chronic pain  Pain Frequency Intermittent  Aggravating Factors  increased activity  Pain Relieving Factors rest          [facility] Adult PT Treatment/Exercise - 05/01/17 0001      Transfers   Transfers Sit to Stand;Stand to Sit   Sit to Stand 5: Supervision   Sit to Stand Details (indicate cue type and reason) Cues needed to put brakes on before standing up inside rollator   Stand to Sit 5: Supervision     Ambulation/Gait   Ambulation/Gait Yes   Ambulation/Gait Assistance 5: Supervision   Ambulation/Gait Assistance Details verbal and visual cues to increase step length on left, to keep rollator within safe distance   Ambulation Distance (Feet) 500 Feet  220; 170; 50, 60   Assistive device Rollator   Gait Pattern Step-through pattern;Decreased stride length;Decreased dorsiflexion - left;Decreased stance time - left;Decreased step length -  left;Decreased dorsiflexion - right;Shuffle;Poor foot clearance - left;Poor foot clearance - right   Ambulation Surface Level;Indoor   Ramp 4: Min assist   Ramp Details (indicate cue type and reason) VC for safety, posture, sequencing and technique, using brakes   Curb 4: Min assist   Curb Details (indicate cue type and reason) Consistent VC for safety, sequencing and technique, use of brakes and placement of feet ascending and descending curb   Gait Comments Step length decreases as fatigue increases, VC required beginning about  100 feet into walk to increase step length and keep rollator within safe distance; navigated around obstacles with rollator with min guard and VC to turn and increase step length.     Neuro Re-ed    Neuro Re-ed Details  Compliant surface: Airex pad placed in corner with chair placed in front for safety and support: EC, 20 seconds x3; progressing to EC head turns R<>L; VC for weight shifting and posture, min guard; EO, alternating UE anterior raise x8 progressing to bilateral UE raise x8, with no LOB, min guard; DLS: mini squats x8, no UE support, no LOB.                  PT Short Term Goals - 04/24/17 1029      PT SHORT TERM GOAL #2   Title Patient decrease falls risk as indicated by a gait velocity of > or = 1.8 ft/sec with LRAD. (NEW TARGET DATE FOR NEW STG 05/05/17)    Baseline 1.41 with cane, 1.63 with rollator   Time 4   Period Weeks   Status New     PT SHORT TERM GOAL #3   Title Patient will demonstrate ability to ambulate >300 feet over level indoor surfaces and negotiating around indoor obstacles with LRAD with 25% cues for increased upright trunk posture, increased foot clearance/step length and perform 180 deg turns to L and R with supervision overall. (NEW TARGET DATE FOR NEW STG 05/05/17)    Time 4   Period Weeks   Status New     PT SHORT TERM GOAL #4   Title Will decrease TUG time with LRAD to < or = 28 seconds (NEW TARGET DATE FOR NEW STG 05/05/17)    Baseline 31 seconds   Time 4   Period Weeks   Status New     PT SHORT TERM GOAL #5   Title Pt will improve balance and decrease falls risk as indicated by improvement in BERG score to >or = 50/56 (NEW TARGET DATE FOR NEW STG 05/05/17)    Baseline 47/56   Time 4   Period Weeks   Status New     PT SHORT TERM GOAL #6   Title Pt will negotiate 4 stairs with one rail and cane with step to sequence and supervision (NEW TARGET DATE FOR NEW STG 05/05/17)    Time 4   Period Weeks   Status New     PT SHORT TERM GOAL #7    Title able to perform pelvic floor contraction 3 sec hold for 10 reps due to improved muscle endurance and ability to relax pelvic floor muscles fully between reps for improved muscle function to control bladder   Time 4   Period Weeks   Status New     PT SHORT TERM GOAL #8   Title able to use urge to void techniques correctly for report of greater bladder control and 25% reduction in urinary frequency   Time 4  Period Weeks   Status New           PT Long Term Goals - 05/01/17 1450      PT LONG TERM GOAL #7   Title FOTO< or = to 44%limited urinary problem survey   Time 12   Period Weeks   Status On-going     PT LONG TERM GOAL #8   Title reduced nocturia to 2x/night   Baseline 4/night   Time 12   Period Weeks   Status On-going     PT LONG TERM GOAL  #9   TITLE reduced pad use to 2/day   Baseline using less but that is medication   Time 12   Period Weeks   Status On-going     PT LONG TERM GOAL  #10   TITLE redued urinary frequency to void every 3 hours for less frequent trips to the bathroom and able to wait until at least 8 seconds of full stream of urine is eliminated   Time 12   Period Weeks   Status On-going               Plan - 05/01/17 1650    Clinical Impression Statement Today's skilled session focused on endurance with gait training, technique up and down curbs/inclines, as well as static and dynamic balance in standing. Patient demonstrated mildly improved endurance with gait indoors, ambulating for a total of 500 feet. Noted VC required as patient begins to fatigue after 100 feet to increase step length and keep rollator within a safe distance. Gait speed with rollator has not improved, will test again next session. Min assist required for safety and sequencing ascending and descending curb (ensuring brakes are on when ascending and descending, getting close to edge of curb before ascending and descending). Verbal cues for weight shifting improved  independence with balance in SLS as trials progressed. Patient will benefit from continued skilled PT to progress towards unmet goals.   Rehab Potential Good   Clinical Impairments Affecting Rehab Potential pain of chronic nature   PT Frequency 3x / week   PT Duration 8 weeks   PT Treatment/Interventions ADLs/Self Care Home Management;Canalith Repostioning;Electrical Stimulation;DME Instruction;Gait training;Stair training;Functional mobility training;Therapeutic activities;Therapeutic exercise;Balance training;Neuromuscular re-education;Cognitive remediation;Patient/family education;Orthotic Fit/Training;Energy conservation;Taping;Vestibular;Biofeedback;Cryotherapy;Moist Heat;Ultrasound;Manual techniques   PT Next Visit Plan Continue to focus on static and dynamic balance, possible use of flat ladder to facilitate increased step length and foot clearance, gait training over uneven surfaces   PT Home Exercise Plan progress as needed   Consulted and Agree with Plan of Care Patient;Family member/caregiver;Other (Comment)      Patient will benefit from skilled therapeutic intervention in order to improve the following deficits and impairments:  Abnormal gait, Decreased endurance, Decreased balance, Decreased cognition, Decreased mobility, Decreased strength, Difficulty walking, Dizziness, Impaired sensation, Pain, Postural dysfunction, Increased muscle spasms  Visit Diagnosis: Unsteadiness on feet  Other abnormalities of gait and mobility     Problem List Patient Active Problem List   Diagnosis Date Noted  . Primary osteoarthritis of left knee 02/09/2017  . SDH (subdural hematoma) (HCC)   . Urinary frequency   . Type 2 diabetes mellitus with peripheral neuropathy (HCC)   . Cough   . Gait disturbance, post-stroke 10/20/2016  . Hemiparesis affecting left side as late effect of stroke (HCC) 10/20/2016  . Essential hypertension 10/19/2016  . Hyperlipidemia LDL goal <70 10/19/2016  .  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  . Left hemiparesis (HCC)   . Atrial fibrillation (HCC)   .  History of fall   . History of traumatic subdural hematoma   . Vascular headache   . Hypokalemia   . Leukocytosis   . Acute blood loss anemia   . Hypoalbuminemia due to protein-calorie malnutrition (HCC)   . Dysphagia, post-stroke   . Acute respiratory failure (HCC)   . Acute embolic stroke (HCC) - R putamen/caudate and R insular infarcts s/p TICI3 revascularization w/ mechanical thrombectomy d/[facility] 10/15[facility]  . Pressure injury of skin 10/15/2016  . HCAP (healthcare-associated pneumonia) 11/09/2014  . Weakness 08/25/2014  . Acute bronchitis 08/25/2014  . UTI (lower urinary tract infection) 08/25/2014  . Fracture of lumbar spine (HCC) 08/25/2014  . Subdural hematoma, post-traumatic (HCC) 05/12/2014  . Chronic atrial fibrillation (HCC) 05/12/2014  . Diabetes mellitus (HCC) 05/12/2014  . OSA on CPAP 05/12/2014    Jeanene ErbMichelle Curtis Uriarte,SPTA 05/01/2017, 5:17 PM  [facility] [address] [place], [place], [address] Phone: [phone]   Fax:  [phone]  Name: Darlina RumpfHue Majewski MRN: [id number] Date of Birth: 12/09/1940  This note has been reviewed and edited by supervising CI.  Sallyanne KusterKathy Bury, PTA, [facility] [address], [address] [place], [place] [address] [phone] 05/02/17, 9:35 AM

## 2017-05-01 NOTE — Therapy (Signed)
[facility] [address] [place], [place], [address] Phone: [phone]   Fax:  [phone]  Physical Therapy Treatment  Patient Details  Name: [name] MRN: [id number] Date of Birth: Jun 15, 1941 Referring Provider: Esaw Dace and Claudette Laws, MD  Encounter Date: 05/01/2017      PT End of Session - 05/01/17 1720    Visit Number 21   Number of Visits 41   Date for PT Re-Evaluation 06/27/17   Authorization Type Medicare & G-codes with proress note every 10th visit    PT Start Time 1443   PT Stop Time 1530   PT Time Calculation (min) 47 min   Activity Tolerance Patient tolerated treatment well   Behavior During Therapy [facility] for tasks assessed/performed      Past Medical History:  Diagnosis Date  . Atrial fibrillation (HCC)   . Chronic back pain    "mid back down into lower back" (08/25/2014)  . Frequent falls   . GERD (gastroesophageal reflux disease)   . Hypertriglyceridemia   . OSA on CPAP   . Osteoarthritis    "knees, hands, back" (08/25/2014)  . Pneumonia ~ 2000 X 1  . Subdural hematoma (HCC) july 2015   S/P fall while on Coumadin  . T12 compression fracture (HCC) 2012  . Type II diabetes mellitus (HCC)     Past Surgical History:  Procedure Laterality Date  . APPENDECTOMY  2012  . CATARACT EXTRACTION W/ INTRAOCULAR LENS  IMPLANT, BILATERAL Bilateral 2000's  . IR GENERIC HISTORICAL  10/15/2016   IR PERCUTANEOUS ART THROMBECTOMY/INFUSION INTRACRANIAL INC DIAG ANGIO 10/15/2016 [name], MD [facility]  . IR GENERIC HISTORICAL  12/13/2016   IR RADIOLOGIST EVAL & MGMT 12/13/2016 [facility]  . RADIOLOGY WITH ANESTHESIA N/A 10/15/2016   Procedure: RADIOLOGY WITH ANESTHESIA;  Surgeon: [name], MD;  Location: [facility] OR;  Service: Radiology;  Laterality: N/A;  . TOTAL ABDOMINAL HYSTERECTOMY  1990    There were no vitals filed for this visit.      Subjective Assessment -  05/01/17 1441    Subjective I am still taking the medication and got up to go to the bathroom 4x and had 3 pads.   Patient is accompained by: Family member  daughter and intrepter on phone   Pertinent History falls with subdural hematoma, Afib, chronic back pain, T12 compression fx, type 2 DM, OA, PNA   Limitations Walking   Patient Stated Goals to go to the bathroom less frequently   Currently in Pain? No/denies                         [facility] Adult PT Treatment/Exercise - 05/01/17 1725      Neuro Re-ed    Neuro Re-ed Details  tactile feedback with pelvic floor contract relax - 5 sec contrac/5 sec relax supine and seated; breathing with cues to bulge pelvic floor and correct posture alignment in sitting position, tactile feedback to pelvic floor for increased relaxation     Knee/Hip Exercises: Sidelying   Hip ADduction --  5 sec hold with pelvic floor contract     Manual Therapy   Manual Therapy Internal Pelvic Floor   Internal Pelvic Floor obudrator internus, fascial release around urethra                PT Education - 05/01/17 1722    Education provided Yes   Education Details breathing and ball squeeze in  sitting   Person(s) Educated Patient;Other (comment)  interpreter   Methods Explanation;Demonstration;Tactile cues;Verbal cues;Handout   Comprehension Verbalized understanding;Returned demonstration          PT Short Term Goals - 05/01/17 1728      PT SHORT TERM GOAL #7   Title able to perform pelvic floor contraction 3 sec hold for 10 reps due to improved muscle endurance and ability to relax pelvic floor muscles fully between reps for improved muscle function to control bladder   Time 4   Period Weeks   Status On-going     PT SHORT TERM GOAL #8   Title able to use urge to void techniques correctly for report of greater bladder control and 25% reduction in urinary frequency   Time 4   Period Weeks   Status On-going           PT Long  Term Goals - 05/01/17 1450      PT LONG TERM GOAL #7   Title FOTO< or = to 44%limited urinary problem survey   Time 12   Period Weeks   Status On-going     PT LONG TERM GOAL #8   Title reduced nocturia to 2x/night   Baseline 4/night   Time 12   Period Weeks   Status On-going     PT LONG TERM GOAL  #9   TITLE reduced pad use to 2/day   Baseline using less but that is medication   Time 12   Period Weeks   Status On-going     PT LONG TERM GOAL  #10   TITLE redued urinary frequency to void every 3 hours for less frequent trips to the bathroom and able to wait until at least 8 seconds of full stream of urine is eliminated   Time 12   Period Weeks   Status On-going               Plan - 05/01/17 1712    Clinical Impression Statement Patient was able to demonstrate improved breathing technique with lots of verbal cues.  PT facilitated body awareness of diaphragm and pelvic floor through breath and pelvic floor contraction with tactile cues on pelvic floor.  Pt had a hard time with relaxing pelvic floor after contraction.  Pt able to perform 7 5 sec contractions with 5 sec rest in between before having weaker contraction.  Pt continues to benefit from skilled PT for improved muscle coordinatin and downtraining of CNS for improved bladder control.   PT Treatment/Interventions ADLs/Self Care Home Management;Canalith Repostioning;Electrical Stimulation;DME Instruction;Gait training;Stair training;Functional mobility training;Therapeutic activities;Therapeutic exercise;Balance training;Neuromuscular re-education;Cognitive remediation;Patient/family education;Orthotic Fit/Training;Energy conservation;Taping;Vestibular;Biofeedback;Cryotherapy;Moist Heat;Ultrasound;Manual techniques   PT Next Visit Plan Continue to focus on static and dynamic balance, possible use of flat ladder to facilitate increased step length and foot clearance, gait training over uneven surfaces. continue core and  pelvic floor muscle coordination, correct breathing for improved diaphragm activiation, tactile and biofeedback for contract/relax of pelvic floor   PT Home Exercise Plan progress as needed   Consulted and Agree with Plan of Care Patient;Other (Comment)  interpreter      Patient will benefit from skilled therapeutic intervention in order to improve the following deficits and impairments:  Abnormal gait, Decreased endurance, Decreased balance, Decreased cognition, Decreased mobility, Decreased strength, Difficulty walking, Dizziness, Impaired sensation, Pain, Postural dysfunction, Increased muscle spasms  Visit Diagnosis: Other muscle spasm  Other lack of coordination  Muscle weakness (generalized)     Problem List Patient Active Problem List  Diagnosis Date Noted  . Primary osteoarthritis of left knee 02/09/2017  . SDH (subdural hematoma) (HCC)   . Urinary frequency   . Type 2 diabetes mellitus with peripheral neuropathy (HCC)   . Cough   . Gait disturbance, post-stroke 10/20/2016  . Hemiparesis affecting left side as late effect of stroke (HCC) 10/20/2016  . Essential hypertension 10/19/2016  . Hyperlipidemia LDL goal <70 10/19/2016  . 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  . Left hemiparesis (HCC)   . Atrial fibrillation (HCC)   . History of fall   . History of traumatic subdural hematoma   . Vascular headache   . Hypokalemia   . Leukocytosis   . Acute blood loss anemia   . Hypoalbuminemia due to protein-calorie malnutrition (HCC)   . Dysphagia, post-stroke   . Acute respiratory failure (HCC)   . Acute embolic stroke (HCC) - R putamen/caudate and R insular infarcts s/p TICI3 revascularization w/ mechanical thrombectomy d/t AF not on [facility] 10/15/2016  . Pressure injury of skin 10/15/2016  . HCAP (healthcare-associated pneumonia) 11/09/2014  . Weakness 08/25/2014  . Acute bronchitis 08/25/2014  . UTI (lower urinary tract infection) 08/25/2014  . Fracture of lumbar  spine (HCC) 08/25/2014  . Subdural hematoma, post-traumatic (HCC) 05/12/2014  . Chronic atrial fibrillation (HCC) 05/12/2014  . Diabetes mellitus (HCC) 05/12/2014  . OSA on CPAP 05/12/2014    Vincente Poli, PT 05/01/2017, 5:28 PM  [facility] [address] [place], [place], [address] Phone: [phone]   Fax:  [phone]  Name: [name] MRN: [id number] Date of Birth: 09/16/1941

## 2017-05-03 ENCOUNTER — Ambulatory Visit: Payer: Medicare Other | Admitting: Physical Therapy

## 2017-05-03 DIAGNOSIS — M6281 Muscle weakness (generalized): Secondary | ICD-10-CM | POA: Diagnosis not present

## 2017-05-03 DIAGNOSIS — R2689 Other abnormalities of gait and mobility: Secondary | ICD-10-CM

## 2017-05-03 DIAGNOSIS — R2681 Unsteadiness on feet: Secondary | ICD-10-CM

## 2017-05-03 NOTE — Therapy (Signed)
[facility] [address] [place], [place], [address] Phone: [phone]   Fax:  [phone]  Physical Therapy Treatment  Patient Details  Name: [name] MRN: [id number] Date of Birth: 1941/08/09 Referring Provider: Myrlene Broker and Alysia Penna, MD  Encounter Date: 05/03/2017      PT End of Session - 05/03/17 1042    Visit Number 23   Number of Visits 41   Authorization Type Medicare & G-codes with proress note every 10th visit    PT Start Time 0930   PT Stop Time 1015   PT Time Calculation (min) 45 min   Equipment Utilized During Treatment Gait belt   Activity Tolerance Patient tolerated treatment well   Behavior During Therapy [facility] for tasks assessed/performed      Past Medical History:  Diagnosis Date  . Atrial fibrillation ([place])   . Chronic back pain    "mid back down into lower back" (08/25/2014)  . Frequent falls   . GERD (gastroesophageal reflux disease)   . Hypertriglyceridemia   . OSA on CPAP   . Osteoarthritis    "knees, hands, back" (08/25/2014)  . Pneumonia ~ 2000 X 1  . Subdural hematoma ([place]) july 2015   S/P fall while on Coumadin  . T12 compression fracture ([place]) 2012  . Type II diabetes mellitus ([place])     Past Surgical History:  Procedure Laterality Date  . APPENDECTOMY  2012  . CATARACT EXTRACTION W/ INTRAOCULAR LENS  IMPLANT, BILATERAL Bilateral 2000's  . IR GENERIC HISTORICAL  10/15/2016   IR PERCUTANEOUS ART THROMBECTOMY/INFUSION INTRACRANIAL INC DIAG ANGIO 10/15/2016 [name], MD [facility]-INTERV RAD  . IR GENERIC HISTORICAL  12/13/2016   IR RADIOLOGIST EVAL & MGMT 12/13/2016 [facility]-INTER[facility] RAD  . RADIOLOGY WITH ANESTHESIA N/A 10/15/2016   Procedure: RADIOLOGY WITH ANESTHESIA;  Surgeon: [name], MD;  Location: [facility];  Service: Radiology;  Laterality: N/A;  . TOTAL ABDOMINAL HYSTERECTOMY  1990    There were no vitals filed for this visit.      Subjective  Assessment - 05/03/17 0934    Subjective Patient reports no changes or falls since last treatment.   Patient is accompained by: Interpreter;Family member   Pertinent History falls with subdural hematoma, Afib, chronic back pain, T12 compression fx, type 2 DM, OA, PNA   Limitations Walking   Patient Stated Goals to go to the bathroom less frequently   Currently in Pain? Yes   Pain Score 2    Pain Location Knee   Pain Orientation Right;Left   Pain Descriptors / Indicators Aching   Pain Type Chronic pain   Pain Onset More than a month ago   Pain Frequency Intermittent   Aggravating Factors  increased activity   Pain Relieving Factors rest            [facility] PT Assessment - 05/03/17 0943      Berg Balance Test   Sit to Stand Able to stand without using hands and stabilize independently   Standing Unsupported Able to stand safely 2 minutes   Sitting with Back Unsupported but Feet Supported on Floor or Stool Able to sit safely and securely 2 minutes   Stand to Sit Sits safely with minimal use of hands   Transfers Able to transfer safely, minor use of hands   Standing Unsupported with Eyes Closed Able to stand 10 seconds with supervision   Standing Ubsupported with Feet Together Able to place feet together independently and stand  1 minute safely   From Standing, Reach Forward with Outstretched Arm Can reach forward >12 cm safely (5")   From Standing Position, Pick up Object from [place] to pick up shoe safely and easily   From Standing Position, Turn to Look Behind Over each Shoulder Looks behind from both sides and weight shifts well   Turn 360 Degrees Able to turn 360 degrees safely one side only in 4 seconds or less   Standing Unsupported, Alternately Place Feet on Step/Stool Able to stand independently and safely and complete 8 steps in 20 seconds   Standing Unsupported, One Foot in Front Able to plae foot ahead of the other independently and hold 30 seconds   Standing on One Leg  Tries to lift leg/unable to hold 3 seconds but remains standing independently   Total Score 49   Berg comment: 49/56= mod risk for falls     Timed Up and Go Test   TUG Normal TUG   Normal TUG (seconds) 31.6   TUG Comments with rollator           [facility] Adult PT Treatment/Exercise - 05/03/17 1019      Ambulation/Gait   Ambulation/Gait Yes   Ambulation/Gait Assistance 5: Supervision   Ambulation/Gait Assistance Details verbal and visual cues to increase step length on left, to keep rollator within safe distance   Ambulation Distance (Feet) 500 Feet   Assistive device Rollator;1 person hand held assist   Gait Pattern Step-through pattern;Decreased dorsiflexion - left;Decreased stride length;Decreased step length - left;Decreased dorsiflexion - right;Poor foot clearance - left;Poor foot clearance - right;Shuffle   Ambulation Surface Level;Indoor   Gait velocity 1.72 ft/s with rollator   Pre-Gait Activities Performed reciprocal stepping, 1 foot to square, through white flat ladder, x3 laps; using 1 person hand held assist, min assist, cues for increased step length   Gait Comments Pt. walked 300 feet with rollator with supervison navigating around obstacles with no LOB, min VC for "big ladder steps." Improved step length following ladder drill for large reciprocal stepping.            PT Short Term Goals - 05/03/17 1028      PT SHORT TERM GOAL #1   Title (TARGET DATE: 03/10/2017) Pt will participate in stair negotiation assessment and vestibular evaluation with goal to be set if needed.   Baseline Both assessed   Time --   Period --   Status Achieved     PT SHORT TERM GOAL #2   Title Patient decrease falls risk as indicated by a gait velocity of > or = 1.8 ft/sec with LRAD. (NEW TARGET DATE FOR NEW STG 05/05/17)    Baseline 05/03/17= 1.72 ft/second; improved but not quite to goal level yet   Time --   Period --   Status Partially Met     PT SHORT TERM GOAL #3   Title Patient  will demonstrate ability to ambulate >300 feet over level indoor surfaces and negotiating around indoor obstacles with LRAD with 25% cues for increased upright trunk posture, increased foot clearance/step length and perform 180 deg turns to L and R with supervision overall. (NEW TARGET DATE FOR NEW STG 05/05/17)    Baseline 05/03/17, met with rollator   Time --   Period --   Status Achieved     PT SHORT TERM GOAL #4   Title Will decrease TUG time with LRAD to < or = 28 seconds (NEW TARGET DATE FOR NEW STG 05/05/17)  Baseline 05/03/17=31.6 seconds using rollator,    Time --   Period --   Status On-going     PT SHORT TERM GOAL #5   Title Pt will improve balance and decrease falls risk as indicated by improvement in BERG score to >or = 50/56 (NEW TARGET DATE FOR NEW STG 05/05/17)    Baseline 05/03/17=49/56, indicating moderate risk for falls; improved but not at goal level yet   Time --   Period --   Status Partially Met     PT SHORT TERM GOAL #6   Title Pt will negotiate 4 stairs with one rail and cane with step to sequence and supervision (NEW TARGET DATE FOR NEW STG 05/05/17)    Time 4   Period Weeks   Status On-going     PT SHORT TERM GOAL #7   Title able to perform pelvic floor contraction 3 sec hold for 10 reps due to improved muscle endurance and ability to relax pelvic floor muscles fully between reps for improved muscle function to control bladder   Time 4   Period Weeks   Status On-going     PT SHORT TERM GOAL #8   Title able to use urge to void techniques correctly for report of greater bladder control and 25% reduction in urinary frequency   Period Weeks   Status On-going              PT Long Term Goals - 05/01/17 1450      PT LONG TERM GOAL #7   Title FOTO< or = to 44%limited urinary problem survey   Time 12   Period Weeks   Status On-going     PT LONG TERM GOAL #8   Title reduced nocturia to 2x/night   Baseline 4/night   Time 12   Period Weeks   Status  On-going     PT LONG TERM GOAL  #9   TITLE reduced pad use to 2/day   Baseline using less but that is medication   Time 12   Period Weeks   Status On-going     PT LONG TERM GOAL  #10   TITLE redued urinary frequency to void every 3 hours for less frequent trips to the bathroom and able to wait until at least 8 seconds of full stream of urine is eliminated   Time 12   Period Weeks   Status On-going          Plan - 05/03/17 1108    Clinical Impression Statement Today's skilled session focused on checking STG's for neuro clinic (pelvic floor goals to be checked by pelvic floor PT). Patient met STG 3 demonstrating improved endurance and balance with gait using a rollator over 300 feet with no LOB. She demonstrated improved independence for step length given VC's intermittently for approximately 25% of time throughout walk. She is steadily progressing towards remaining STG's as seen in improved scores on the BERG (49/56) and 59mgait speed (1.72 ft/second), just not quite to goal level yet. TUG score remained about the same using rollator. Demonstrated improved independence with increased step length ambulating with rollator after ladder drill for larger reciprocal steps, requiring fewer verbal cues. Patient is progressing well towards goals and will benefit from continued skilled PT to progress towards unmet goals.   Rehab Potential Good   Clinical Impairments Affecting Rehab Potential pain of chronic nature   PT Frequency 3x / week   PT Duration 8 weeks   PT Treatment/Interventions ADLs/Self Care  Home Management;Canalith Repostioning;Electrical Stimulation;DME Instruction;Gait training;Stair training;Functional mobility training;Therapeutic activities;Therapeutic exercise;Balance training;Neuromuscular re-education;Cognitive remediation;Patient/family education;Orthotic Fit/Training;Energy conservation;Taping;Vestibular;Biofeedback;Cryotherapy;Moist Heat;Ultrasound;Manual techniques   PT Next  Visit Plan Check goal re: ascending/descending stairs using L hand rail without AD as ran out of time today;  use flat ladder to facilitate increased step length and foot clearance, static and dynamic balance activities.   PT Home Exercise Plan progress as needed   Consulted and Agree with Plan of Care Patient;Other (Comment)   Family Member Consulted daughter      Patient will benefit from skilled therapeutic intervention in order to improve the following deficits and impairments:  Abnormal gait, Decreased endurance, Decreased balance, Decreased cognition, Decreased mobility, Decreased strength, Difficulty walking, Dizziness, Impaired sensation, Pain, Postural dysfunction, Increased muscle spasms  Visit Diagnosis: Unsteadiness on feet  Other abnormalities of gait and mobility     Problem List Patient Active Problem List   Diagnosis Date Noted  . Primary osteoarthritis of left knee 02/09/2017  . SDH (subdural hematoma) ([place])   . Urinary frequency   . Type 2 diabetes mellitus with peripheral neuropathy (HCC)   . Cough   . Gait disturbance, post-stroke 10/20/2016  . Hemiparesis affecting left side as late effect of stroke ([place]) 10/20/2016  . Essential hypertension 10/19/2016  . Hyperlipidemia LDL goal <70 10/19/2016  . 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 Left hemiparesis ([place])   . Atrial fibrillation ([place])   . History of fall   . History of traumatic subdural hematoma   . Vascular headache   . Hypokalemia   . Leukocytosis   . Acute blood loss anemia   . Hypoalbuminemia due to protein-calorie malnutrition ([place])   . Dysphagia, post-stroke   . Acute respiratory failure ([place])   . Acute embolic stroke (HCC) - R putamen/caudate and R insular infarcts s/p TICI3 revascularization w/ mechanical thrombectomy d/t AF not on [facility] 10/15/2016  . Pressure injury of skin 10/15/2016  . HCAP (healthcare-associated pneumonia) 11/09/2014  . Weakness 08/25/2014  . Acute bronchitis 08/25/2014   . UTI (lower urinary tract infection) 08/25/2014  . Fracture of lumbar spine ([place]) 08/25/2014  . Subdural hematoma, post-traumatic ([place]) 05/12/2014  . Chronic atrial fibrillation ([place]) 05/12/2014  . Diabetes mellitus ([place]) 05/12/2014  . OSA on CPAP 05/12/2014    Rulon Eisenmenger, SPTA 05/03/2017, 11:15 AM  [facility] [address] [place], [place], [address] Phone: [phone]   Fax:  [phone]  Name: [name] MRN: [id number] Date of Birth: 22-Sep-1941   This note has been reviewed and edited by supervising CI.  Willow Ora, PTA, [facility] [address] [place], [place] [address] [phone] 05/03/17, 11:19 PM

## 2017-05-08 ENCOUNTER — Encounter: Payer: Medicare Other | Admitting: Physical Therapy

## 2017-05-08 ENCOUNTER — Ambulatory Visit: Payer: Medicare Other | Admitting: Physical Therapy

## 2017-05-10 ENCOUNTER — Encounter: Payer: Medicare Other | Admitting: Physical Therapy

## 2017-05-10 ENCOUNTER — Ambulatory Visit: Payer: Medicare Other

## 2017-05-12 ENCOUNTER — Ambulatory Visit: Payer: Medicare Other

## 2017-05-18 ENCOUNTER — Ambulatory Visit: Payer: Medicare Other | Attending: Physical Medicine & Rehabilitation | Admitting: Physical Therapy

## 2017-05-18 ENCOUNTER — Ambulatory Visit: Payer: Medicare Other | Attending: Urology | Admitting: Physical Therapy

## 2017-05-18 DIAGNOSIS — R42 Dizziness and giddiness: Secondary | ICD-10-CM | POA: Diagnosis present

## 2017-05-18 DIAGNOSIS — M6281 Muscle weakness (generalized): Secondary | ICD-10-CM

## 2017-05-18 DIAGNOSIS — M62838 Other muscle spasm: Secondary | ICD-10-CM | POA: Insufficient documentation

## 2017-05-18 DIAGNOSIS — R278 Other lack of coordination: Secondary | ICD-10-CM | POA: Insufficient documentation

## 2017-05-18 DIAGNOSIS — R2681 Unsteadiness on feet: Secondary | ICD-10-CM | POA: Diagnosis present

## 2017-05-18 DIAGNOSIS — I69354 Hemiplegia and hemiparesis following cerebral infarction affecting left non-dominant side: Secondary | ICD-10-CM | POA: Diagnosis present

## 2017-05-18 DIAGNOSIS — R2689 Other abnormalities of gait and mobility: Secondary | ICD-10-CM

## 2017-05-18 DIAGNOSIS — R296 Repeated falls: Secondary | ICD-10-CM

## 2017-05-18 NOTE — Therapy (Signed)
[facility] [address] [place], [place], [address] Phone: [phone]   Fax:  [phone]  Physical Therapy Treatment  Patient Details  Name: [name] MRN: [id number] Date of Birth: 05-22-1941 Referring Provider: Myrlene Broker and Alysia Penna, MD  Encounter Date: 05/18/2017      PT End of Session - 05/18/17 1209    Visit Number 25   Number of Visits 41   Date for PT Re-Evaluation 06/27/17  06/04/17 re-eval for Neuro PT   Authorization Type Medicare & G-codes with proress note every 10th visit    PT Start Time 1107   PT Stop Time 1150   PT Time Calculation (min) 43 min   Activity Tolerance Patient tolerated treatment well   Behavior During Therapy [facility] for tasks assessed/performed      Past Medical History:  Diagnosis Date  . Atrial fibrillation ([place])   . Chronic back pain    "mid back down into lower back" (08/25/2014)  . Frequent falls   . GERD (gastroesophageal reflux disease)   . Hypertriglyceridemia   . OSA on CPAP   . Osteoarthritis    "knees, hands, back" (08/25/2014)  . Pneumonia ~ 2000 X 1  . Subdural hematoma ([place]) july 2015   S/P fall while on Coumadin  . T12 compression fracture ([place]) 2012  . Type II diabetes mellitus ([place])     Past Surgical History:  Procedure Laterality Date  . APPENDECTOMY  2012  . CATARACT EXTRACTION W/ INTRAOCULAR LENS  IMPLANT, BILATERAL Bilateral 2000's  . IR GENERIC HISTORICAL  10/15/2016   IR PERCUTANEOUS ART THROMBECTOMY/INFUSION INTRACRANIAL INC DIAG ANGIO 10/15/2016 [name], MD [facility]-INTERV RAD  . IR GENERIC HISTORICAL  12/13/2016   IR RADIOLOGIST EVAL & MGMT 12/13/2016 [facility]INTER[facility] RAD  . RADIOLOGY WITH ANESTHESIA N/A 10/15/2016   Procedure: RADIOLOGY WITH ANESTHESIA;  Surgeon: [name], MD;  Location: [facility];  Service: Radiology;  Laterality: N/A;  . TOTAL ABDOMINAL HYSTERECTOMY  1990    There were no vitals filed for this visit.        Subjective Assessment - 05/18/17 1201    Subjective Pt returns after a couple of weeks of not being able to attend therapy due to daughter out of town.  Ambulating with rollator without any issues, no falls.  Making good progress with pelvic floor PT.     Patient is accompained by: Interpreter;Family member   Pertinent History falls with subdural hematoma, Afib, chronic back pain, T12 compression fx, type 2 DM, OA, PNA   Limitations Walking   Currently in Pain? No/denies            [facility] Adult PT Treatment/Exercise - 05/18/17 1128      Ambulation/Gait   Stairs Yes   Stairs Assistance 5: Supervision   Stairs Assistance Details (indicate cue type and reason) cues to switch cane to other hand and hold door frame when stepping last step into or out of house due to rail ending before final step   Stair Management Technique One rail Left;Step to pattern;Forwards;With cane   Number of Stairs 12   Height of Stairs 6     Reviewed and adjusted exercises for HEP: see below   Perform these for strengthening M, W, F Functional Quadriceps: Sit to Stand    Sit on edge of chair (use a high chair or build up seat surface with pillows), feet flat on floor. Stand upright, extending knees fully. Repeat 10-12 times per set. Do  _1-2_ sessions per day.   Hip Side Kick    Holding a chair for balance, keep legs shoulder width apart and toes pointed forward. Bring a leg out to side, keeping knee straight. Do not lean. Repeat using other leg, alternating legs. Repeat _10_ times each leg. Do __1-2__ sessions per day.  http://gt2.exer.us/343   Copyright  VHI. All rights reserved.   Hip Extension (Standing)    Stand with support. Move one leg backward with straight knee and then back to start position. Repeat with other leg. Repeat _10_ times each leg. Do 1-2 times a day.  Copyright  VHI. All rights reserved.   Perform these T, Th and Sat   "I love a Parade" Lift   At counter for  balance as needed: high knee marching, alternate legs, 10 repetitions. 3 second pauses with each knee lift.. Do _1-2_ sessions per day.   Feet Apart (Compliant Surface) Head Motion - Eyes Open    With eyes open, standing on solid floor or compliant surface (pillow) if daughter is present: feet shoulder width apart, move head slowly: side to side 10 times; then move arms up and down 10 times Repeat 2 times per session. Do 2 sessions per day.            PT Education - 05/18/17 1205    Education provided Yes   Education Details updated standing balance and strengthening HEP to improve pt's ability to perform at home without daughter's assistance while daughter is out of town   [employer]) Educated Patient;Child(ren)   Methods Explanation;Handout;Demonstration   Comprehension Verbalized understanding;Returned demonstration          PT Short Term Goals - 05/18/17 1126      PT SHORT TERM GOAL #2   Title Patient decrease falls risk as indicated by a gait velocity of > or = 1.8 ft/sec with LRAD. (NEW TARGET DATE FOR NEW STG 05/05/17)    Baseline 05/03/17= 1.72 ft/second; improved but not quite to goal level yet   Status Partially Met     PT SHORT TERM GOAL #3   Title Patient will demonstrate ability to ambulate >300 feet over level indoor surfaces and negotiating around indoor obstacles with LRAD with 25% cues for increased upright trunk posture, increased foot clearance/step length and perform 180 deg turns to L and R with supervision overall. (NEW TARGET DATE FOR NEW STG 05/05/17)    Baseline 05/03/17, met with rollator   Status Achieved     PT SHORT TERM GOAL #4   Title Will decrease TUG time with LRAD to < or = 28 seconds (NEW TARGET DATE FOR NEW STG 05/05/17)    Baseline 05/03/17=31.6 seconds using rollator,    Status Partially Met     PT SHORT TERM GOAL #5   Title Pt will improve balance and decrease falls risk as indicated by improvement in BERG score to >or = 50/56 (NEW TARGET  DATE FOR NEW STG 05/05/17)    Baseline 05/03/17=49/56, indicating moderate risk for falls; improved but not at goal level yet   Status Partially Met     PT SHORT TERM GOAL #6   Title Pt will negotiate 4 stairs with one rail and cane with step to sequence and supervision (NEW TARGET DATE FOR NEW STG 05/05/17)    Baseline met on 05/18/17, rail on L to ascend and cane in RUE   Status Achieved           PT Long Term Goals - 05/18/17  [place] #1   Title Patient will demonstrate independence with HEP and verbalize options for community fitness program. (NEW TARGET DATE FOR LTG 06/04/17)   Time 8   Period Weeks   Status On-going     PT LONG TERM GOAL #2   Title Patient will improve gait velocity to >2.0 ft/sec with LRAD to indicate pt is safe for limited community ambulation. NEW TARGET DATE FOR LTG 06/04/17   Baseline 1.60f/sec with cane, 1.63 ft/sec with rollator   Time 8   Period Weeks   Status On-going     PT LONG TERM GOAL #3   Title Patient will score > or = 52/56 on the Berg Balance Test to indicate decreased falls risk. NEW TARGET DATE FOR LTG 06/04/17   Baseline 47/56 on 6/13   Time 8   Period Weeks   Status On-going     PT LONG TERM GOAL #4   Title Pt will decrease falls risk with sit <> stand and turning as indicated by TUG score of <18.  NEW TARGET DATE FOR LTG 06/04/17    Baseline 31 with cane   Time 8   Period Weeks   Status On-going     PT LONG TERM GOAL #5   Title Patient will ambulate 500 feet outside over uneven terrain including ramps/curbs with rollator safely with supervision with improved foot clearance and upright posture.  NEW TARGET DATE FOR LTG 06/04/17   Time 8   Period Weeks   Status On-going               Plan - 05/18/17 1210    Clinical Impression Statement Treatment session today focused on assessment of final STG-stair negotiation with cane and rail.  Pt able to perform safely with supervision but verbal cues for safe  sequencing and switching cane to other hand where rail runs out at home.  Daughter present to observe and provide appropriate cues.  Rest of session focused on review and adjustment of current HEP to allow pt to perform safely next week while daughter is out of town; see exercises below.  Pt return demonstrated all safely-discussed safe hand placement and technique for home.  Pt is showing improved LE strength, functional balance and improved posture and postural control.  Pt making good progress towards LTG.   Rehab Potential Good   Clinical Impairments Affecting Rehab Potential pain of chronic nature   PT Frequency 3x / week   PT Duration 8 weeks   PT Treatment/Interventions ADLs/Self Care Home Management;Canalith Repostioning;Electrical Stimulation;DME Instruction;Gait training;Stair training;Functional mobility training;Therapeutic activities;Therapeutic exercise;Balance training;Neuromuscular re-education;Cognitive remediation;Patient/family education;Orthotic Fit/Training;Energy conservation;Taping;Vestibular;Biofeedback;Cryotherapy;Moist Heat;Ultrasound;Manual techniques   PT Next Visit Plan Continue to focus on static and dynamic balance, possible use of flat ladder to facilitate increased step length and foot clearance, gait training over uneven surfaces/outside with rollator   PT Home Exercise Plan progress as needed   Consulted and Agree with Plan of Care Patient;Family member/caregiver;Other (Comment)      Patient will benefit from skilled therapeutic intervention in order to improve the following deficits and impairments:  Abnormal gait, Decreased endurance, Decreased balance, Decreased cognition, Decreased mobility, Decreased strength, Difficulty walking, Dizziness, Impaired sensation, Pain, Postural dysfunction, Increased muscle spasms  Visit Diagnosis: Muscle weakness (generalized)  Unsteadiness on feet  Other abnormalities of gait and mobility  Hemiplegia and hemiparesis  following cerebral infarction affecting left non-dominant side (HCC)  Repeated falls     Problem List Patient Active  Problem List   Diagnosis Date Noted  . Primary osteoarthritis of left knee 02/09/2017  . SDH (subdural hematoma) ([place])   . Urinary frequency   . Type 2 diabetes mellitus with peripheral neuropathy (HCC)   . Cough   . Gait disturbance, post-stroke 10/20/2016  . Hemiparesis affecting left side as late effect of stroke ([place]) 10/20/2016  . Essential hypertension 10/19/2016  . Hyperlipidemia LDL goal <70 10/19/2016  . 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 Left hemiparesis ([place])   . Atrial fibrillation ([place])   . History of fall   . History of traumatic subdural hematoma   . Vascular headache   . Hypokalemia   . Leukocytosis   . Acute blood loss anemia   . Hypoalbuminemia due to protein-calorie malnutrition ([place])   . Dysphagia, post-stroke   . Acute respiratory failure ([place])   . Acute embolic stroke (HCC) - R putamen/caudate and R insular infarcts s/p TICI3 revascularization w/ mechanical thrombectomy d/t AF not on [facility] 10/15/2016  . Pressure injury of skin 10/15/2016  . HCAP (healthcare-associated pneumonia) 11/09/2014  . Weakness 08/25/2014  . Acute bronchitis 08/25/2014  . UTI (lower urinary tract infection) 08/25/2014  . Fracture of lumbar spine ([place]) 08/25/2014  . Subdural hematoma, post-traumatic ([place]) 05/12/2014  . Chronic atrial fibrillation ([place]) 05/12/2014  . Diabetes mellitus ([place]) 05/12/2014  . OSA on CPAP 05/12/2014    Raylene Everts, PT, DPT 05/18/17    12:18 PM    [facility] [address] [place], [place], [address] Phone: [phone]   Fax:  [phone]  Name: Saja Bartolini MRN: [id number] Date of Birth: 1941/01/19

## 2017-05-18 NOTE — Patient Instructions (Signed)
Perform these for strengthening M, W, F Functional Quadriceps: Sit to Stand    Sit on edge of chair (use a high chair or build up seat surface with pillows), feet flat on floor. Stand upright, extending knees fully. Repeat 10-12 times per set. Do _1-2_ sessions per day.   Hip Side Kick    Holding a chair for balance, keep legs shoulder width apart and toes pointed forward. Bring a leg out to side, keeping knee straight. Do not lean. Repeat using other leg, alternating legs. Repeat _10_ times each leg. Do __1-2__ sessions per day.  http://gt2.exer.us/343   Copyright  VHI. All rights reserved.   Hip Extension (Standing)    Stand with support. Move one leg backward with straight knee and then back to start position. Repeat with other leg. Repeat _10_ times each leg. Do 1-2 times a day.  Copyright  VHI. All rights reserved.   Perform these T, Th and Sat   "I love a Parade" Lift   At counter for balance as needed: high knee marching, alternate legs, 10 repetitions. 3 second pauses with each knee lift.. Do _1-2_ sessions per day.   Feet Apart (Compliant Surface) Head Motion - Eyes Open    With eyes open, standing on solid floor or compliant surface (pillow) if daughter is present: feet shoulder width apart, move head slowly: side to side 10 times; then move arms up and down 10 times Repeat 2 times per session. Do 2 sessions per day.

## 2017-05-18 NOTE — Therapy (Signed)
[facility] [address], [place], [place], [address] Phone: [phone]   Fax:  [phone]  Physical Therapy Treatment  Patient Details  Name: [name] MRN: [id number] Date of Birth: 1941/10/08 Referring Provider: Myrlene Broker and Alysia Penna, MD  Encounter Date: 05/18/2017      PT End of Session - 05/18/17 1009    Visit Number 24   Number of Visits 41   Date for PT Re-Evaluation 05/05/17   Authorization Type Medicare & G-codes with proress note every 10th visit    PT Start Time 0935   PT Stop Time 1013   PT Time Calculation (min) 38 min   Activity Tolerance Patient tolerated treatment well      Past Medical History:  Diagnosis Date  . Atrial fibrillation ([place])   . Chronic back pain    "mid back down into lower back" (08/25/2014)  . Frequent falls   . GERD (gastroesophageal reflux disease)   . Hypertriglyceridemia   . OSA on CPAP   . Osteoarthritis    "knees, hands, back" (08/25/2014)  . Pneumonia ~ 2000 X 1  . Subdural hematoma ([place]) july 2015   S/P fall while on Coumadin  . T12 compression fracture ([place]) 2012  . Type II diabetes mellitus ([place])     Past Surgical History:  Procedure Laterality Date  . APPENDECTOMY  2012  . CATARACT EXTRACTION W/ INTRAOCULAR LENS  IMPLANT, BILATERAL Bilateral 2000's  . IR GENERIC HISTORICAL  10/15/2016   IR PERCUTANEOUS ART THROMBECTOMY/INFUSION INTRACRANIAL INC DIAG ANGIO 10/15/2016 [name], MD [facility]INTER[facility]  . IR GENERIC HISTORICAL  12/13/2016   IR RADIOLOGIST EVAL & MGMT 12/13/2016 [facility]  . RADIOLOGY WITH ANESTHESIA N/A 10/15/2016   Procedure: RADIOLOGY WITH ANESTHESIA;  Surgeon: [name], MD;  Location: [place];  Service: Radiology;  Laterality: N/A;  . TOTAL ABDOMINAL HYSTERECTOMY  1990    There were no vitals filed for this visit.      Subjective Assessment - 05/18/17 0943    Subjective Pt reports she goes to the bathroom  2-3 x/ night and is only using 2 pads per day now.  Pt feels 40% improved.  She states she is able to hold the urine and make it to the bathroom more now.   Patient Stated Goals to go to the bathroom less frequently   Currently in Pain? No/denies                         [facility] Adult PT Treatment/Exercise - 05/18/17 0001      Neuro Re-ed    Neuro Re-ed Details  tactile feedback with pelvic floor contract relax - 5 sec contrac/5 sec relax supine and seated; breathing with cues to bulge pelvic floor and correct posture alignment in sitting position, tactile feedback to pelvic floor for increased relaxation     Lumbar Exercises: Supine   Ab Set 5 reps  bracing   Large Ball Abdominal Isometric 10 reps  slowly with cues to relax in between     Manual Therapy   Manual therapy comments p informed and consent given to perform internal muscle palpation done in supine and vaginal   Internal Pelvic Floor internal muscle feedback for improved contraction                  PT Short Term Goals - 05/03/17 1028      PT SHORT TERM GOAL #1  Title (TARGET DATE: 03/10/2017) Pt will participate in stair negotiation assessment and vestibular evaluation with goal to be set if needed.   Baseline Both assessed   Time --   Period --   Status Achieved     PT SHORT TERM GOAL #2   Title Patient decrease falls risk as indicated by a gait velocity of > or = 1.8 ft/sec with LRAD. (NEW TARGET DATE FOR NEW STG 05/05/17)    Baseline 05/03/17= 1.72 ft/second; improved but not quite to goal level yet   Time --   Period --   Status Partially Met     PT SHORT TERM GOAL #3   Title Patient will demonstrate ability to ambulate >300 feet over level indoor surfaces and negotiating around indoor obstacles with LRAD with 25% cues for increased upright trunk posture, increased foot clearance/step length and perform 180 deg turns to L and R with supervision overall. (NEW TARGET DATE FOR NEW STG 05/05/17)     Baseline 05/03/17, met with rollator   Time --   Period --   Status Achieved     PT SHORT TERM GOAL #4   Title Will decrease TUG time with LRAD to < or = 28 seconds (NEW TARGET DATE FOR NEW STG 05/05/17)    Baseline 05/03/17=31.6 seconds using rollator,    Time --   Period --   Status On-going     PT SHORT TERM GOAL #5   Title Pt will improve balance and decrease falls risk as indicated by improvement in BERG score to >or = 50/56 (NEW TARGET DATE FOR NEW STG 05/05/17)    Baseline 05/03/17=49/56, indicating moderate risk for falls; improved but not at goal level yet   Time --   Period --   Status Partially Met     PT SHORT TERM GOAL #6   Title Pt will negotiate 4 stairs with one rail and cane with step to sequence and supervision (NEW TARGET DATE FOR NEW STG 05/05/17)    Time 4   Period Weeks   Status On-going     PT SHORT TERM GOAL #7   Title able to perform pelvic floor contraction 3 sec hold for 10 reps due to improved muscle endurance and ability to relax pelvic floor muscles fully between reps for improved muscle function to control bladder   Time 4   Period Weeks   Status On-going     PT SHORT TERM GOAL #8   Title able to use urge to void techniques correctly for report of greater bladder control and 25% reduction in urinary frequency   Period Weeks   Status On-going           PT Long Term Goals - 05/18/17 0944      PT LONG TERM GOAL #7   Title FOTO< or = to 44%limited urinary problem survey   Time 12   Period Weeks     PT LONG TERM GOAL #8   Title reduced nocturia to 2x/night   Baseline 2-3x   Time 12   Period Weeks   Status On-going     PT LONG TERM GOAL  #9   TITLE reduced pad use to 2/day   Baseline 2/day   Time 12   Period Weeks   Status Achieved     PT LONG TERM GOAL  #10   TITLE redued urinary frequency to void every 3 hours for less frequent trips to the bathroom and able to wait until at least 8  seconds of full stream of urine is eliminated    Baseline better   Time 12   Period Weeks   Status On-going               Plan - 05/18/17 1019    Clinical Impression Statement Pt was able to demonstrate improved ability to engage pelvic floor and muscle contraction was more steady and strong on the left side with tactile feedback.  Pt needs a lot of cues to relax in between reps of all exercises.  She was abl to begin working on core contraction with PF contraction for improved muscle coordination.  Pt needs cues to prevent holding her breath.  Skilled PT needed for improved muscle coordination.   PT Treatment/Interventions ADLs/Self Care Home Management;Canalith Repostioning;Electrical Stimulation;DME Instruction;Gait training;Stair training;Functional mobility training;Therapeutic activities;Therapeutic exercise;Balance training;Neuromuscular re-education;Cognitive remediation;Patient/family education;Orthotic Fit/Training;Energy conservation;Taping;Vestibular;Biofeedback;Cryotherapy;Moist Heat;Ultrasound;Manual techniques   PT Next Visit Plan porgress core and pelvic floor contractions with breathing   Consulted and Agree with Plan of Care Patient;Family member/caregiver  daughter   Family Member Consulted daughter      Patient will benefit from skilled therapeutic intervention in order to improve the following deficits and impairments:  Abnormal gait, Decreased endurance, Decreased balance, Decreased cognition, Decreased mobility, Decreased strength, Difficulty walking, Dizziness, Impaired sensation, Pain, Postural dysfunction, Increased muscle spasms  Visit Diagnosis: Other muscle spasm  Other lack of coordination  Muscle weakness (generalized)     Problem List Patient Active Problem List   Diagnosis Date Noted  . Primary osteoarthritis of left knee 02/09/2017  . SDH (subdural hematoma) ([place])   . Urinary frequency   . Type 2 diabetes mellitus with peripheral neuropathy (HCC)   . Cough   . Gait disturbance,  post-stroke 10/20/2016  . Hemiparesis affecting left side as late effect of stroke ([place]) 10/20/2016  . Essential hypertension 10/19/2016  . Hyperlipidemia LDL goal <70 10/19/2016  . 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 Left hemiparesis ([place])   . Atrial fibrillation ([place])   . History of fall   . History of traumatic subdural hematoma   . Vascular headache   . Hypokalemia   . Leukocytosis   . Acute blood loss anemia   . Hypoalbuminemia due to protein-calorie malnutrition ([place])   . Dysphagia, post-stroke   . Acute respiratory failure ([place])   . Acute embolic stroke (HCC) - R putamen/caudate and R insular infarcts s/p TICI3 revascularization w/ mechanical thrombectomy d/t AF not on [facility] 10/15/2016  . Pressure injury of skin 10/15/2016  . HCAP (healthcare-associated pneumonia) 11/09/2014  . Weakness 08/25/2014  . Acute bronchitis 08/25/2014  . UTI (lower urinary tract infection) 08/25/2014  . Fracture of lumbar spine ([place]) 08/25/2014  . Subdural hematoma, post-traumatic ([place]) 05/12/2014  . Chronic atrial fibrillation ([place]) 05/12/2014  . Diabetes mellitus ([place]) 05/12/2014  . OSA on CPAP 05/12/2014    Zannie Cove, PT 05/18/2017, 10:22 AM  [facility] [address] [place], [place], [address] Phone: [phone]   Fax:  [phone]  Name: [name] MRN: [id number] Date of Birth: 09/22/1941

## 2017-05-19 ENCOUNTER — Ambulatory Visit: Payer: Medicare Other

## 2017-05-19 DIAGNOSIS — R2689 Other abnormalities of gait and mobility: Secondary | ICD-10-CM

## 2017-05-19 DIAGNOSIS — R2681 Unsteadiness on feet: Secondary | ICD-10-CM

## 2017-05-19 DIAGNOSIS — M6281 Muscle weakness (generalized): Secondary | ICD-10-CM | POA: Diagnosis not present

## 2017-05-19 NOTE — Therapy (Signed)
[facility] [address] [place], [place], [address] Phone: [phone]   Fax:  [phone]  Physical Therapy Treatment  Patient Details  Name: [name] MRN: [id number] Date of Birth: [birth year] Referring Provider: Myrlene Broker and Alysia Penna, MD  Encounter Date: 05/19/2017      PT End of Session - 05/19/17 1059    Visit Number 26   Number of Visits 41   Date for PT Re-Evaluation 06/27/17   Authorization Type Medicare & G-codes with proress note every 10th visit    PT Start Time 1020  pt late   PT Stop Time 1059   PT Time Calculation (min) 39 min   Equipment Utilized During Treatment --  min guard to S   Activity Tolerance Patient tolerated treatment well   Behavior During Therapy [facility] for tasks assessed/performed      Past Medical History:  Diagnosis Date  . Atrial fibrillation ([place])   . Chronic back pain    "mid back down into lower back" (08/25/2014)  . Frequent falls   . GERD (gastroesophageal reflux disease)   . Hypertriglyceridemia   . OSA on CPAP   . Osteoarthritis    "knees, hands, back" (08/25/2014)  . Pneumonia ~ 2000 X 1  . Subdural hematoma ([place]) july 2015   S/P fall while on Coumadin  . T12 compression fracture ([place]) 2012  . Type II diabetes mellitus ([place])     Past Surgical History:  Procedure Laterality Date  . APPENDECTOMY  2012  . CATARACT EXTRACTION W/ INTRAOCULAR LENS  IMPLANT, BILATERAL Bilateral 2000's  . IR GENERIC HISTORICAL  10/15/2016   IR PERCUTANEOUS ART THROMBECTOMY/INFUSION INTRACRANIAL INC DIAG ANGIO 10/15/2016 [name], MD [facility]INTER[facility]  . IR GENERIC HISTORICAL  12/13/2016   IR RADIOLOGIST EVAL & MGMT 12/13/2016 [facility]  . RADIOLOGY WITH ANESTHESIA N/A 10/15/2016   Procedure: RADIOLOGY WITH ANESTHESIA;  Surgeon: [name], MD;  Location: [place];  Service: Radiology;  Laterality: N/A;  . TOTAL ABDOMINAL HYSTERECTOMY  1990    There were no  vitals filed for this visit.      Subjective Assessment - 05/19/17 1023    Subjective Pt denied falls since last visit. Pt reported she's tired today, due to being late and rushed (going fast).    Patient is accompained by: Family member;Interpreter   Pertinent History falls with subdural hematoma, Afib, chronic back pain, T12 compression fx, type 2 DM, OA, PNA   Patient Stated Goals to go to the bathroom less frequently   Currently in Pain? No/denies                         [facility] Adult PT Treatment/Exercise - 05/19/17 1024      Transfers   Transfers Sit to Stand;Stand to Sit   Sit to Stand 5: Supervision   Sit to Stand Details (indicate cue type and reason) Cues for sequencing with rolltaor, locking brakes.    Stand to Sit 5: Supervision   Stand to Sit Details see above.      Ambulation/Gait   Ambulation/Gait Yes   Ambulation/Gait Assistance 5: Supervision;4: Min guard   Ambulation/Gait Assistance Details Cues to improve heel strike, stride length, and upright posture. Pt required rest break after amb. and // bar activities 2/2 fatigue.    Ambulation Distance (Feet) 75 Feet  230, 230'   Assistive device Parallel bars;Rollator   Gait Pattern Step-through pattern;Decreased dorsiflexion - left;Decreased  stride length;Decreased step length - left;Decreased dorsiflexion - right;Poor foot clearance - left;Poor foot clearance - right;Shuffle   Ambulation Surface Level;Indoor   Pre-Gait Activities Performed in // bars with min guard to S for safety and technique: pt performed anterior stepping over 2" beam with tactile and verbal cues to improve heel strike, upright posture, stride length, and knee ext. during midstance, x30 reps/LE, with BUE support. Pt then amb. over ladder in // bars with 2 UE support to improve stride length, heel strike, and upright posture, 4x15'.                PT Education - 05/18/17 1205    Education provided Yes   Education Details  updated standing balance and strengthening HEP to improve pt's ability to perform at home without daughter's assistance while daughter is out of town   [employer]) Educated Patient;Child(ren)   Methods Explanation;Handout;Demonstration   Comprehension Verbalized understanding;Returned demonstration          PT Short Term Goals - 05/18/17 1126      PT SHORT TERM GOAL #2   Title Patient decrease falls risk as indicated by a gait velocity of > or = 1.8 ft/sec with LRAD. (NEW TARGET DATE FOR NEW STG 05/05/17)    Baseline 05/03/17= 1.72 ft/second; improved but not quite to goal level yet   Status Partially Met     PT SHORT TERM GOAL #3   Title Patient will demonstrate ability to ambulate >300 feet over level indoor surfaces and negotiating around indoor obstacles with LRAD with 25% cues for increased upright trunk posture, increased foot clearance/step length and perform 180 deg turns to [name] and R with supervision overall. (NEW TARGET DATE FOR NEW STG 05/05/17)    Baseline 05/03/17, met with rollator   Status Achieved     PT SHORT TERM GOAL #4   Title Will decrease TUG time with LRAD to < or = 28 seconds (NEW TARGET DATE FOR NEW STG 05/05/17)    Baseline 05/03/17=31.6 seconds using rollator,    Status Partially Met     PT SHORT TERM GOAL #5   Title Pt will improve balance and decrease falls risk as indicated by improvement in BERG score to >or = 50/56 (NEW TARGET DATE FOR NEW STG 05/05/17)    Baseline 05/03/17=49/56, indicating moderate risk for falls; improved but not at goal level yet   Status Partially Met     PT SHORT TERM GOAL #6   Title Pt will negotiate 4 stairs with one rail and cane with step to sequence and supervision (NEW TARGET DATE FOR NEW STG 05/05/17)    Baseline met on 05/18/17, rail on [name] to ascend and cane in RUE   Status Achieved           PT Long Term Goals - 05/18/17 1217      PT LONG TERM GOAL #1   Title Patient will demonstrate independence with HEP and verbalize  options for community fitness program. (NEW TARGET DATE FOR LTG 06/04/17)   Time 8   Period Weeks   Status On-going     PT LONG TERM GOAL #2   Title Patient will improve gait velocity to >2.0 ft/sec with LRAD to indicate pt is safe for limited community ambulation. NEW TARGET DATE FOR LTG 06/04/17   Baseline 1.60f/sec with cane, 1.63 ft/sec with rollator   Time 8   Period Weeks   Status On-going     PT LONG TERM GOAL #3  Title Patient will score > or = 52/56 on the Berg Balance Test to indicate decreased falls risk. NEW TARGET DATE FOR LTG 06/04/17   Baseline 47/56 on 6/13   Time 8   Period Weeks   Status On-going     PT LONG TERM GOAL #4   Title Pt will decrease falls risk with sit <> stand and turning as indicated by TUG score of <18.  NEW TARGET DATE FOR LTG 06/04/17    Baseline 31 with cane   Time 8   Period Weeks   Status On-going     PT LONG TERM GOAL #5   Title Patient will ambulate 500 feet outside over uneven terrain including ramps/curbs with rollator safely with supervision with improved foot clearance and upright posture.  NEW TARGET DATE FOR LTG 06/04/17   Time 8   Period Weeks   Status On-going               Plan - 05/19/17 1059    Clinical Impression Statement Today's skilled session focused on gait training to improve heel strike, stride length, and upright posture. Pt noted to demonstrate improvements in gait deviations after pre-gait training activities. However, pt required rest breaks during longer bouts of amb., 2/2 fatigue and incr. in gait deviations. Continue with POC.    Rehab Potential Good   Clinical Impairments Affecting Rehab Potential pain of chronic nature   PT Frequency 3x / week   PT Duration 8 weeks   PT Treatment/Interventions ADLs/Self Care Home Management;Canalith Repostioning;Electrical Stimulation;DME Instruction;Gait training;Stair training;Functional mobility training;Therapeutic activities;Therapeutic exercise;Balance  training;Neuromuscular re-education;Cognitive remediation;Patient/family education;Orthotic Fit/Training;Energy conservation;Taping;Vestibular;Biofeedback;Cryotherapy;Moist Heat;Ultrasound;Manual techniques   PT Next Visit Plan Continue to focus on static and dynamic balance, possible use of flat ladder to facilitate increased step length and foot clearance, gait training over uneven surfaces/outside with rollator   PT Home Exercise Plan progress as needed   Consulted and Agree with Plan of Care Patient;Family member/caregiver;Other (Comment)      Patient will benefit from skilled therapeutic intervention in order to improve the following deficits and impairments:  Abnormal gait, Decreased endurance, Decreased balance, Decreased cognition, Decreased mobility, Decreased strength, Difficulty walking, Dizziness, Impaired sensation, Pain, Postural dysfunction, Increased muscle spasms  Visit Diagnosis: Other abnormalities of gait and mobility  Unsteadiness on feet     Problem List Patient Active Problem List   Diagnosis Date Noted  . Primary osteoarthritis of left knee 02/09/2017  . SDH (subdural hematoma) ([place])   . Urinary frequency   . Type 2 diabetes mellitus with peripheral neuropathy (HCC)   . Cough   . Gait disturbance, post-stroke 10/20/2016  . Hemiparesis affecting left side as late effect of stroke ([place]) 10/20/2016  . Essential hypertension 10/19/2016  . Hyperlipidemia LDL goal <70 10/19/2016  . 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 Left hemiparesis ([place])   . Atrial fibrillation ([place])   . History of fall   . History of traumatic subdural hematoma   . Vascular headache   . Hypokalemia   . Leukocytosis   . Acute blood loss anemia   . Hypoalbuminemia due to protein-calorie malnutrition ([place])   . Dysphagia, post-stroke   . Acute respiratory failure ([place])   . Acute embolic stroke (HCC) - R putamen/caudate and R insular infarcts s/p TICI3 revascularization w/ mechanical  thrombectomy d/t AF not on [facility] 10/15/2016  . Pressure injury of skin 10/15/2016  . HCAP (healthcare-associated pneumonia) 11/09/2014  . Weakness 08/25/2014  . Acute bronchitis 08/25/2014  . UTI (lower urinary  tract infection) 08/25/2014  . Fracture of lumbar spine ([place]) 08/25/2014  . Subdural hematoma, post-traumatic ([place]) 05/12/2014  . Chronic atrial fibrillation ([employer]) 05/12/2014  . Diabetes mellitus ([place]) 05/12/2014  . OSA on CPAP 05/12/2014    [name] 05/19/2017, 11:01 AM  [facility] [address] [place], [place], [address] Phone: [phone]   Fax:  [phone]  Name: [name] MRN: [id number] Date of Birth: 1940/12/18  Geoffry Paradise, PT,DPT 05/19/17 11:01 AM Phone: [phone] Fax: [phone]

## 2017-05-29 ENCOUNTER — Ambulatory Visit: Payer: Medicare Other | Admitting: Physical Therapy

## 2017-05-30 ENCOUNTER — Encounter: Payer: Medicare Other | Admitting: Physical Therapy

## 2017-05-31 ENCOUNTER — Ambulatory Visit: Payer: Medicare Other | Admitting: Physical Therapy

## 2017-05-31 DIAGNOSIS — R296 Repeated falls: Secondary | ICD-10-CM

## 2017-05-31 DIAGNOSIS — I69354 Hemiplegia and hemiparesis following cerebral infarction affecting left non-dominant side: Secondary | ICD-10-CM

## 2017-05-31 DIAGNOSIS — M6281 Muscle weakness (generalized): Secondary | ICD-10-CM

## 2017-05-31 DIAGNOSIS — R2681 Unsteadiness on feet: Secondary | ICD-10-CM

## 2017-05-31 DIAGNOSIS — R2689 Other abnormalities of gait and mobility: Secondary | ICD-10-CM

## 2017-05-31 DIAGNOSIS — R42 Dizziness and giddiness: Secondary | ICD-10-CM

## 2017-05-31 NOTE — Therapy (Signed)
[facility] [address] [place], [place], [address] Phone: [phone]   Fax:  [phone]  Physical Therapy Treatment  Patient Details  Name: [name] MRN: [id number] Date of Birth: 12-Sep-1941 Referring Provider: Alysia Penna, MD  Encounter Date: 05/31/2017      PT End of Session - 05/31/17 1147    Visit Number 27  re-assessment today for recert   Number of Visits 41   Date for PT Re-Evaluation 06/27/17  06/30/17 neuro PT per recertification   Authorization Type Medicare & G-codes with proress note every December 30, 2022 visit    Authorization Time Period 1/68 end of certification for pelvic floor PT; 3/72 end of certification for neuro PT   PT Start Time 1104   PT Stop Time 1150   PT Time Calculation (min) 46 min   Equipment Utilized During Treatment --  min guard to S   Activity Tolerance Patient tolerated treatment well   Behavior During Therapy [facility] for tasks assessed/performed      Past Medical History:  Diagnosis Date  . Atrial fibrillation ([place])   . Chronic back pain    "mid back down into lower back" (08/25/2014)  . Frequent falls   . GERD (gastroesophageal reflux disease)   . Hypertriglyceridemia   . OSA on CPAP   . Osteoarthritis    "knees, hands, back" (08/25/2014)  . Pneumonia ~ 2000 X 1  . Subdural hematoma ([place]) july 2015   S/P fall while on Coumadin  . T12 compression fracture ([place]) 2012  . Type II diabetes mellitus ([place])     Past Surgical History:  Procedure Laterality Date  . APPENDECTOMY  2012  . CATARACT EXTRACTION W/ INTRAOCULAR LENS  IMPLANT, BILATERAL Bilateral 2000's  . IR GENERIC HISTORICAL  10/15/2016   IR PERCUTANEOUS ART THROMBECTOMY/INFUSION INTRACRANIAL INC DIAG ANGIO 10/15/2016 [name], MD [facility]INTER[facility] RAD  . IR GENERIC HISTORICAL  12/13/2016   IR RADIOLOGIST EVAL & MGMT 12/13/2016 [facility]  . RADIOLOGY WITH ANESTHESIA N/A 10/15/2016   Procedure: RADIOLOGY WITH  ANESTHESIA;  Surgeon: [name], MD;  Location: [facility];  Service: Radiology;  Laterality: N/A;  . TOTAL ABDOMINAL HYSTERECTOMY  1990    There were no vitals filed for this visit.      Subjective Assessment - 05/31/17 1117    Subjective No issues or falls; daughter asking about making up missed visits this summer.   Patient is accompained by: Family member;Interpreter   Pertinent History falls with subdural hematoma, Afib, chronic back pain, T12 compression fx, type 2 DM, OA, PNA   Limitations Walking   Currently in Pain? No/denies            [facility] PT Assessment - 05/31/17 1118      Assessment   Medical Diagnosis L CVA/R hemiplegia   Referring Provider Alysia Penna, MD     Standardized Balance Assessment   10 Meter Walk 14.94 seconds or 2.19 ft/sec     Berg Balance Test   Sit to Stand Able to stand without using hands and stabilize independently   Standing Unsupported Able to stand safely 2 minutes   Sitting with Back Unsupported but Feet Supported on Floor or Stool Able to sit safely and securely 2 minutes   Stand to Sit Sits safely with minimal use of hands   Transfers Able to transfer safely, minor use of hands   Standing Unsupported with Eyes Closed Able to stand 10 seconds safely   Standing Ubsupported with Feet Together  Able to place feet together independently and stand 1 minute safely   From Standing, Reach Forward with Outstretched Arm Can reach forward >12 cm safely (5")   From Standing Position, Pick up Object from [place] to pick up shoe safely and easily   From Standing Position, Turn to Look Behind Over each Shoulder Looks behind one side only/other side shows less weight shift   Turn 360 Degrees Able to turn 360 degrees safely one side only in 4 seconds or less   Standing Unsupported, Alternately Place Feet on Step/Stool Able to stand independently and safely and complete 8 steps in 20 seconds   Standing Unsupported, One Foot in Front Able to plae  foot ahead of the other independently and hold 30 seconds   Standing on One Leg Able to lift leg independently and hold equal to or more than 3 seconds   Total Score 50   Berg comment: 50/56     Timed Up and Go Test   TUG Normal TUG   Normal TUG (seconds) 29.72  with rollator   TUG Comments with rollator, <29 seconds with quad cane; improvement in safety and foot clearance with turning                             PT Education - 05/31/17 1156    Education provided Yes   Education Details progress towards LTG, clinical findings. Plan to recertify x 4 more weeks   Person(s) Educated Patient;Child(ren)   Methods Explanation   Comprehension Verbalized understanding          PT Short Term Goals - 05/31/17 1204      PT SHORT TERM GOAL #1   Title = LTG with 4 week recertification           PT Long Term Goals - 05/31/17 1144      PT LONG TERM GOAL #1   Title Patient will demonstrate independence with HEP and verbalize options for community fitness program.    Time 4   Period Weeks   Status Revised   Target Date 06/30/17     PT LONG TERM GOAL #2   Title Patient will improve gait velocity to >2.0 ft/sec with LRAD to indicate pt is safe for limited community ambulation.    Baseline  2.19 ft/sec with rollator    Time --   Period --   Status Achieved     PT LONG TERM GOAL #3   Title Patient will score > or = 52/56 on the Berg Balance Test to indicate decreased falls risk.    Baseline 50/56    Time 4   Period Weeks   Status Revised   Target Date 06/30/17     PT LONG TERM GOAL #4   Title Pt will decrease falls risk with sit <> stand and turning as indicated by TUG score of <20 with rollator   Baseline 29 seconds with rollator/<29 seconds with quad cane   Time 4   Period Weeks   Status Revised   Target Date 06/30/17     PT LONG TERM GOAL #5   Title Patient will ambulate 500 feet outside over uneven terrain including ramps/curbs with rollator  safely with supervision with improved foot clearance and upright posture.    Time 4   Period Weeks   Status Revised   Target Date 06/30/17  Plan - 05/31/17 1158    Clinical Impression Statement Treatment session today focused on re-assessment of LTG to determine if more visits required due to missed visits this summer.  Pt has met 1 LTG with improved gait velocity to >2.0 ft/sec with rollator and is demonstrating improved upright posture, improved weight shifting, increased stance time LLE and improved foot clearance and step length bilaterally.  Pt continues to present with impaired static and dynamic balance with continued intermittent LOB to R and continues to require intermittent verbal cues for safety/sequencing with quad cane and rollator.  Pt remains at moderate risk for falls.  Pt will benefit from ongoing PT services to address impairments and to continue to decrease falls risk and maximize functional mobility independence.   Rehab Potential Good   Clinical Impairments Affecting Rehab Potential pain of chronic nature   PT Frequency 3x / week  1 visit with Pelvic PT, 2 with neuro PT   PT Duration 4 weeks   PT Treatment/Interventions ADLs/Self Care Home Management;Canalith Repostioning;Electrical Stimulation;DME Instruction;Gait training;Stair training;Functional mobility training;Therapeutic activities;Therapeutic exercise;Balance training;Neuromuscular re-education;Cognitive remediation;Patient/family education;Orthotic Fit/Training;Energy conservation;Taping;Vestibular;Biofeedback;Cryotherapy;Moist Heat;Ultrasound;Manual techniques   PT Next Visit Plan Continue to focus on static and dynamic balance-wt shifting to L, possible use of flat ladder to facilitate increased step length and foot clearance, gait training over uneven surfaces/outside with rollator   PT Home Exercise Plan progress as needed   Consulted and Agree with Plan of Care Patient;Family  member/caregiver;Other (Comment)   Family Member Consulted daughter      Patient will benefit from skilled therapeutic intervention in order to improve the following deficits and impairments:  Abnormal gait, Decreased endurance, Decreased balance, Decreased cognition, Decreased mobility, Decreased strength, Difficulty walking, Dizziness, Impaired sensation, Pain, Postural dysfunction, Increased muscle spasms, Decreased safety awareness  Visit Diagnosis: Other abnormalities of gait and mobility  Unsteadiness on feet  Muscle weakness (generalized)  Hemiplegia and hemiparesis following cerebral infarction affecting left non-dominant side (HCC)  Repeated falls  Dizziness and giddiness     Problem List Patient Active Problem List   Diagnosis Date Noted  . Primary osteoarthritis of left knee 02/09/2017  . SDH (subdural hematoma) ([place])   . Urinary frequency   . Type 2 diabetes mellitus with peripheral neuropathy (HCC)   . Cough   . Gait disturbance, post-stroke 10/20/2016  . Hemiparesis affecting left side as late effect of stroke ([place]) 10/20/2016  . Essential hypertension 10/19/2016  . Hyperlipidemia LDL goal <70 10/19/2016  . 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 Left hemiparesis ([place])   . Atrial fibrillation ([place])   . History of fall   . History of traumatic subdural hematoma   . Vascular headache   . Hypokalemia   . Leukocytosis   . Acute blood loss anemia   . Hypoalbuminemia due to protein-calorie malnutrition ([place])   . Dysphagia, post-stroke   . Acute respiratory failure ([place])   . Acute embolic stroke (HCC) - R putamen/caudate and R insular infarcts s/p TICI3 revascularization w/ mechanical thrombectomy d/t AF not on [facility] 10/15/2016  . Pressure injury of skin 10/15/2016  . HCAP (healthcare-associated pneumonia) 11/09/2014  . Weakness 08/25/2014  . Acute bronchitis 08/25/2014  . UTI (lower urinary tract infection) 08/25/2014  . Fracture of lumbar spine ([place])  08/25/2014  . Subdural hematoma, post-traumatic ([place]) 05/12/2014  . Chronic atrial fibrillation ([place]) 05/12/2014  . Diabetes mellitus ([place]) 05/12/2014  . OSA on CPAP 05/12/2014    Raylene Everts, PT, DPT 05/31/17    12:11 PM  [facility] [address] [place], [place], [address] Phone: [phone]   Fax:  [phone]  Name: Missy Baksh MRN: [id number] Date of Birth: Apr 11, 1941

## 2017-06-07 ENCOUNTER — Encounter: Payer: Medicare Other | Admitting: Physical Therapy

## 2017-06-07 ENCOUNTER — Ambulatory Visit: Payer: Medicare Other

## 2017-06-07 DIAGNOSIS — M6281 Muscle weakness (generalized): Secondary | ICD-10-CM

## 2017-06-07 DIAGNOSIS — R2681 Unsteadiness on feet: Secondary | ICD-10-CM

## 2017-06-07 DIAGNOSIS — R2689 Other abnormalities of gait and mobility: Secondary | ICD-10-CM

## 2017-06-07 NOTE — Therapy (Signed)
[facility] [address] [place], [place], [address] Phone: [phone]   Fax:  [phone]  Physical Therapy Treatment  Patient Details  Name: [name] MRN: [id number] Date of Birth: 1940/12/29 Referring Provider: Claudette Laws, MD  Encounter Date: 06/07/2017      PT End of Session - 06/07/17 1147    Visit Number 28   Number of Visits 41   Date for PT Re-Evaluation 06/27/17   Authorization Type Medicare & G-codes with proress note every 10th visit    Authorization Time Period 9/11 end of certification for pelvic floor PT; 9/14 end of certification for neuro PT   PT Start Time 1103   PT Stop Time 1144   PT Time Calculation (min) 41 min   Equipment Utilized During Treatment --  min guard to S prn   Activity Tolerance Patient tolerated treatment well   Behavior During Therapy [facility] for tasks assessed/performed      Past Medical History:  Diagnosis Date  . Atrial fibrillation (HCC)   . Chronic back pain    "mid back down into lower back" (08/25/2014)  . Frequent falls   . GERD (gastroesophageal reflux disease)   . Hypertriglyceridemia   . OSA on CPAP   . Osteoarthritis    "knees, hands, back" (08/25/2014)  . Pneumonia ~ 2000 X 1  . Subdural hematoma (HCC) july 2015   S/P fall while on Coumadin  . T12 compression fracture (HCC) 2012  . Type II diabetes mellitus (HCC)     Past Surgical History:  Procedure Laterality Date  . APPENDECTOMY  2012  . CATARACT EXTRACTION W/ INTRAOCULAR LENS  IMPLANT, BILATERAL Bilateral 2000's  . IR GENERIC HISTORICAL  10/15/2016   IR PERCUTANEOUS ART THROMBECTOMY/INFUSION INTRACRANIAL INC DIAG ANGIO 10/15/2016 [name], MD [facility]INTER[facility]  . IR GENERIC HISTORICAL  12/13/2016   IR RADIOLOGIST EVAL & MGMT 12/13/2016 [facility]  . RADIOLOGY WITH ANESTHESIA N/A 10/15/2016   Procedure: RADIOLOGY WITH ANESTHESIA;  Surgeon: [name], MD;  [facility];  Service:  Radiology;  Laterality: N/A;  . TOTAL ABDOMINAL HYSTERECTOMY  1990    There were no vitals filed for this visit.      Subjective Assessment - 06/07/17 1106    Subjective Pt denied falls or changes since last visit. Pt reports she still feels tired.    Patient is accompained by: Family member;Interpreter   Pertinent History falls with subdural hematoma, Afib, chronic back pain, T12 compression fx, type 2 DM, OA, PNA   Patient Stated Goals to go to the bathroom less frequently   Currently in Pain? No/denies                         [facility] Adult PT Treatment/Exercise - 06/07/17 1107      Transfers   Transfers Sit to Stand;Stand to Sit   Sit to Stand 4: Min guard;5: Supervision   Sit to Stand Details Tactile cues for initiation;Tactile cues for sequencing;Tactile cues for weight shifting;Verbal cues for sequencing;Verbal cues for technique;Verbal cues for precautions/safety   Sit to Stand Details (indicate cue type and reason) Cues to improve anterior weight shifting and to improve eccentric control during stand to sit.   Stand to Sit 4: Min guard;5: Supervision   Stand to Sit Details (indicate cue type and reason) Verbal cues for technique;Verbal cues for sequencing;Verbal cues for precautions/safety   Stand to Sit Details see above.   Comments STS from  lower surfaces, to mimic chair pt used during gardening. x5 reps.     Ambulation/Gait   Ambulation/Gait Yes   Ambulation/Gait Assistance 5: Supervision;4: Min guard   Ambulation/Gait Assistance Details Cues to improve B heel strike (L>R), stride length, weight shifting over inclines/declines. Pt required min guard during amb. over grassy terrain 2/2 incr. postural sway and cues to stay close to rollator. Pt also amb. over even (indoor) terrain in and out of // bars with flat ladder to improve heel strike and stride length.    Ambulation Distance (Feet) 600 Feet  in/outdoors, 400' indoors (x40' in //bars)   Assistive  device Rollator   Gait Pattern Step-through pattern;Decreased dorsiflexion - left;Decreased stride length;Decreased step length - left;Decreased dorsiflexion - right;Poor foot clearance - left;Poor foot clearance - right;Shuffle   Ambulation Surface Level;Unlevel;Indoor;Outdoor;Paved;Grass                PT Education - 06/07/17 1146    Education provided Yes   Education Details PT discussed the importance of heel strike during amb. at home and to continue HEP as prescribed.    Person(s) Educated Patient;Child(ren)   Methods Explanation   Comprehension Verbalized understanding          PT Short Term Goals - 05/31/17 1204      PT SHORT TERM GOAL #1   Title = LTG with 4 week recertification           PT Long Term Goals - 05/31/17 1144      PT LONG TERM GOAL #1   Title Patient will demonstrate independence with HEP and verbalize options for community fitness program.    Time 4   Period Weeks   Status Revised   Target Date 06/30/17     PT LONG TERM GOAL #2   Title Patient will improve gait velocity to >2.0 ft/sec with LRAD to indicate pt is safe for limited community ambulation.    Baseline  2.19 ft/sec with rollator    Time --   Period --   Status Achieved     PT LONG TERM GOAL #3   Title Patient will score > or = 52/56 on the Berg Balance Test to indicate decreased falls risk.    Baseline 50/56    Time 4   Period Weeks   Status Revised   Target Date 06/30/17     PT LONG TERM GOAL #4   Title Pt will decrease falls risk with sit <> stand and turning as indicated by TUG score of <20 with rollator   Baseline 29 seconds with rollator/<29 seconds with quad cane   Time 4   Period Weeks   Status Revised   Target Date 06/30/17     PT LONG TERM GOAL #5   Title Patient will ambulate 500 feet outside over uneven terrain including ramps/curbs with rollator safely with supervision with improved foot clearance and upright posture.    Time 4   Period Weeks   Status  Revised   Target Date 06/30/17               Plan - 06/07/17 1147    Clinical Impression Statement Pt continues to require frequent cues and demo to improve B heel strike (L>R) and stride length. Pt demonstrated improve heel strike and stride length after amb. in // bars with flat ladder on ground. Pt demonstrated proper and safe technique during STS txfs from 14" surface to mimic STS from garden chair. Continue with POC.  Rehab Potential Good   Clinical Impairments Affecting Rehab Potential pain of chronic nature   PT Frequency 3x / week  1 visit with Pelvic PT, 2 with neuro PT   PT Duration 4 weeks   PT Treatment/Interventions ADLs/Self Care Home Management;Canalith Repostioning;Electrical Stimulation;DME Instruction;Gait training;Stair training;Functional mobility training;Therapeutic activities;Therapeutic exercise;Balance training;Neuromuscular re-education;Cognitive remediation;Patient/family education;Orthotic Fit/Training;Energy conservation;Taping;Vestibular;Biofeedback;Cryotherapy;Moist Heat;Ultrasound;Manual techniques   PT Next Visit Plan Trial 14" stool STS txfs over compliant surface to mimic garden terrain. Continue to focus on static and dynamic balance-wt shifting to L, possible use of flat ladder to facilitate increased step length and foot clearance, gait training over uneven surfaces/outside with rollator   PT Home Exercise Plan progress as needed   Consulted and Agree with Plan of Care Patient;Family member/caregiver;Other (Comment)   Family Member Consulted daughter      Patient will benefit from skilled therapeutic intervention in order to improve the following deficits and impairments:  Abnormal gait, Decreased endurance, Decreased balance, Decreased cognition, Decreased mobility, Decreased strength, Difficulty walking, Dizziness, Impaired sensation, Pain, Postural dysfunction, Increased muscle spasms, Decreased safety awareness  Visit Diagnosis: Other  abnormalities of gait and mobility  Unsteadiness on feet  Muscle weakness (generalized)     Problem List Patient Active Problem List   Diagnosis Date Noted  . Primary osteoarthritis of left knee 02/09/2017  . SDH (subdural hematoma) (HCC)   . Urinary frequency   . Type 2 diabetes mellitus with peripheral neuropathy (HCC)   . Cough   . Gait disturbance, post-stroke 10/20/2016  . Hemiparesis affecting left side as late effect of stroke (HCC) 10/20/2016  . Essential hypertension 10/19/2016  . Hyperlipidemia LDL goal <70 10/19/2016  . 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  . Left hemiparesis (HCC)   . Atrial fibrillation (HCC)   . History of fall   . History of traumatic subdural hematoma   . Vascular headache   . Hypokalemia   . Leukocytosis   . Acute blood loss anemia   . Hypoalbuminemia due to protein-calorie malnutrition (HCC)   . Dysphagia, post-stroke   . Acute respiratory failure (HCC)   . Acute embolic stroke (HCC) - R putamen/caudate and R insular infarcts s/p TICI3 revascularization w/ mechanical thrombectomy d/t AF not on [facility] 10/15/2016  . Pressure injury of skin 10/15/2016  . HCAP (healthcare-associated pneumonia) 11/09/2014  . Weakness 08/25/2014  . Acute bronchitis 08/25/2014  . UTI (lower urinary tract infection) 08/25/2014  . Fracture of lumbar spine (HCC) 08/25/2014  . Subdural hematoma, post-traumatic (HCC) 05/12/2014  . Chronic atrial fibrillation (HCC) 05/12/2014  . Diabetes mellitus (HCC) 05/12/2014  . OSA on CPAP 05/12/2014    Miller,Jennifer L 06/07/2017, 11:49 AM  [facility] [address] [place], [place], [address] Phone: [phone]   Fax:  [phone]  Name: [name] MRN: [id number] Date of Birth: 12/12/1940  Zerita Boers, PT,DPT 06/07/17 11:50 AM Phone: [phone] Fax: [phone]

## 2017-06-08 ENCOUNTER — Encounter: Payer: Self-pay | Admitting: Physical Therapy

## 2017-06-08 ENCOUNTER — Ambulatory Visit: Payer: Medicare Other | Admitting: Physical Therapy

## 2017-06-08 DIAGNOSIS — R278 Other lack of coordination: Secondary | ICD-10-CM

## 2017-06-08 DIAGNOSIS — M6281 Muscle weakness (generalized): Secondary | ICD-10-CM

## 2017-06-08 DIAGNOSIS — M62838 Other muscle spasm: Secondary | ICD-10-CM | POA: Diagnosis not present

## 2017-06-08 IMAGING — RF DG SWALLOWING FUNCTION
1 series · 17 of 24 positions shown · non-contrast
Comparison: None.

CLINICAL DATA: Coughing with meals.  Prior stroke.

EXAM:
MODIFIED BARIUM SWALLOW
TECHNIQUE: Different consistencies of barium were administered orally to the
patient by the Speech Pathologist. Imaging of the pharynx was
performed in the lateral projection.
FLUOROSCOPY TIME:  Fluoroscopy Time:  1 minutes and 28 seconds
Number of Acquired Spot Images: 0

[Series 1: run · 12 acquisitions, 17 frames shown]
[im 1/12]
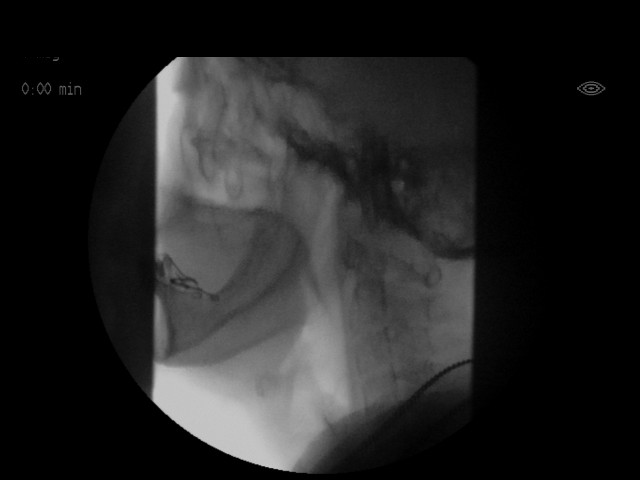
[im 2/12]
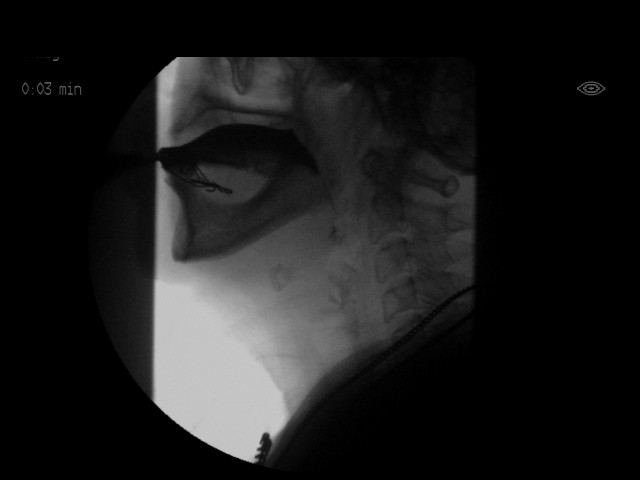
[im 2/12]
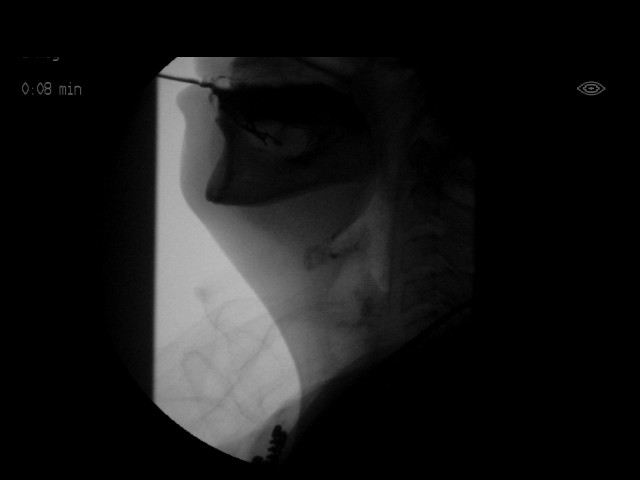
[im 3/12]
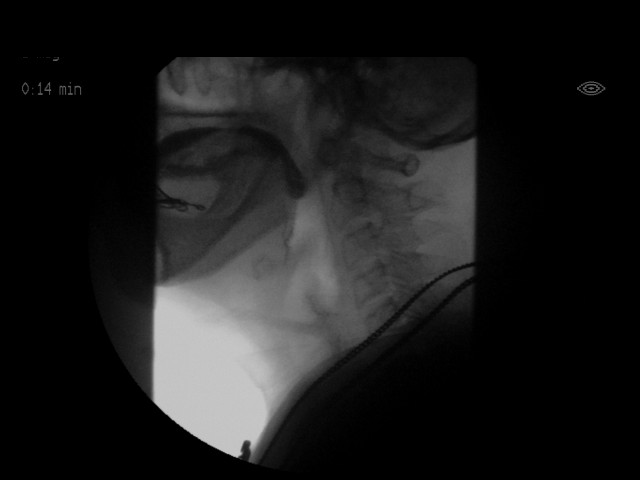
[im 4/12]
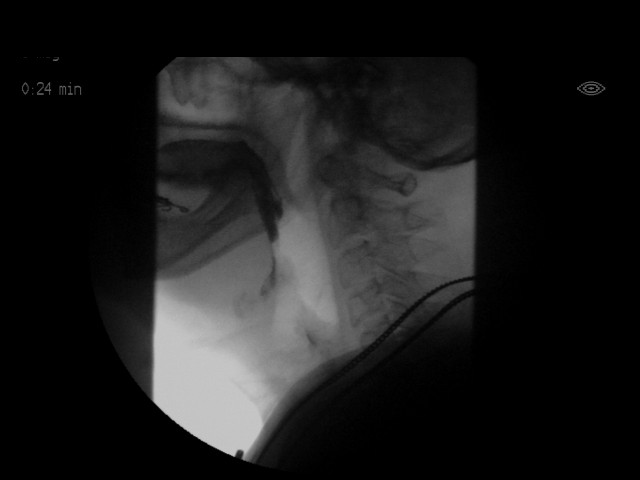
[im 4/12]
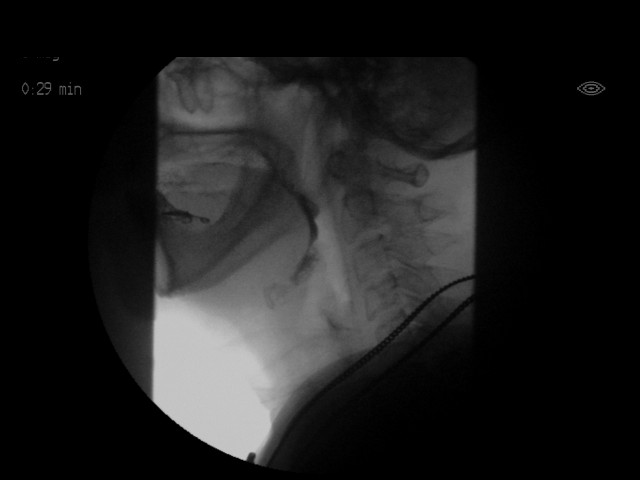
[im 5/12]
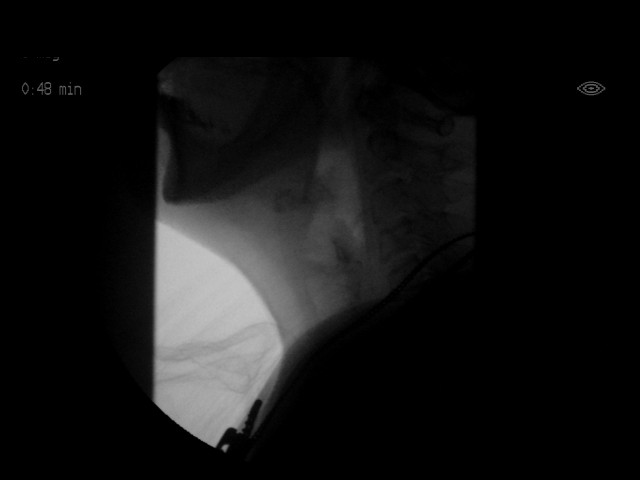
[im 6/12]
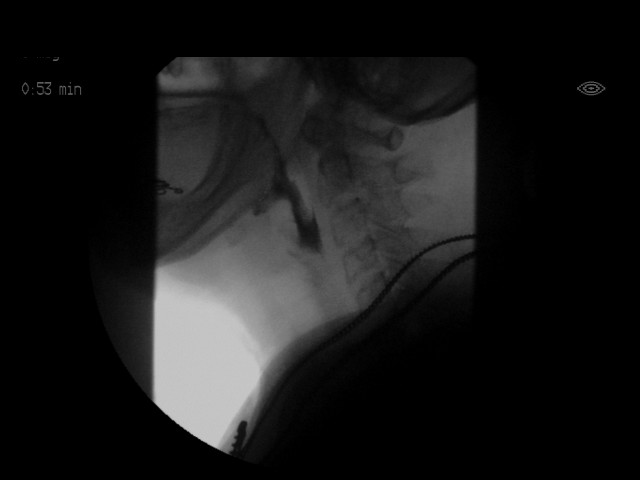
[im 7/12]
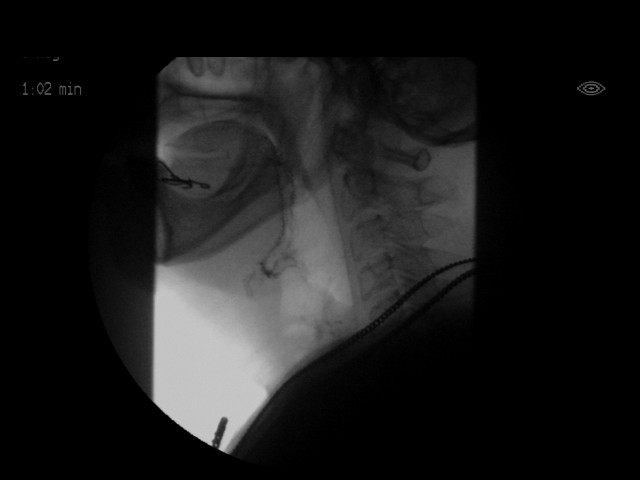
[im 7/12]
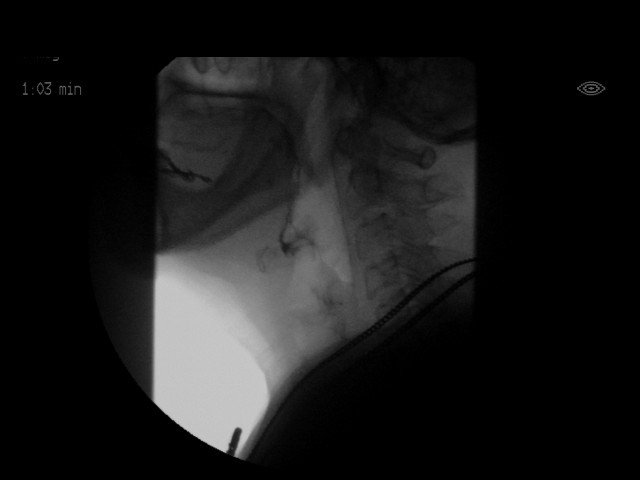
[im 8/12]
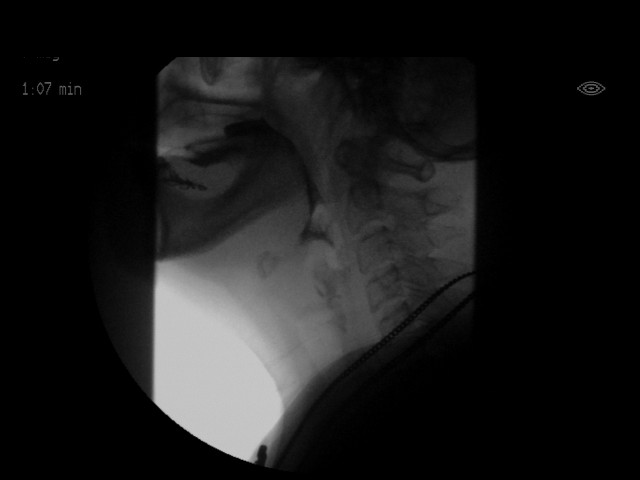
[im 9/12]
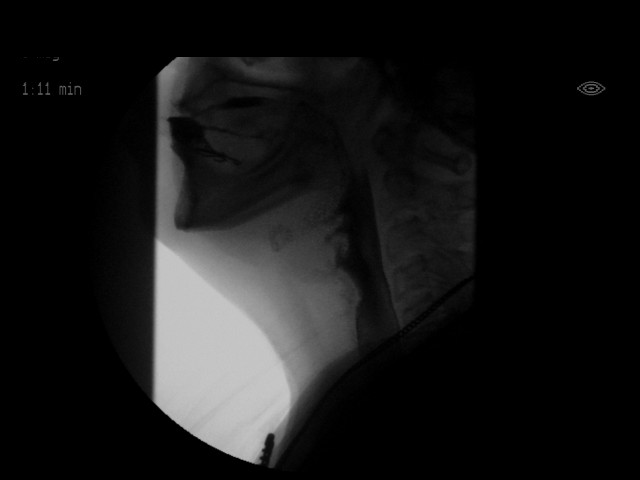
[im 9/12]
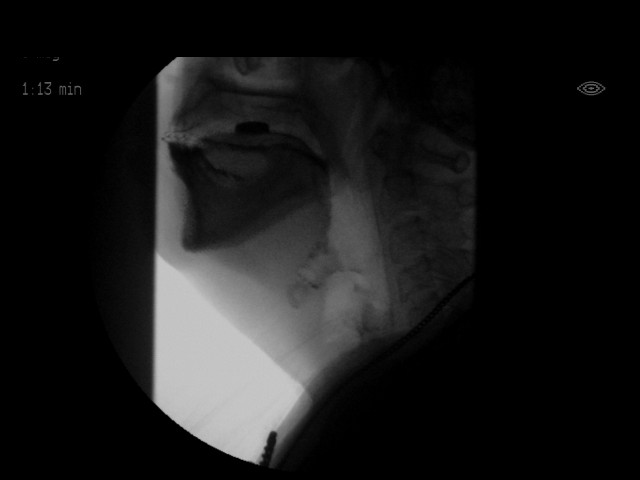
[im 10/12]
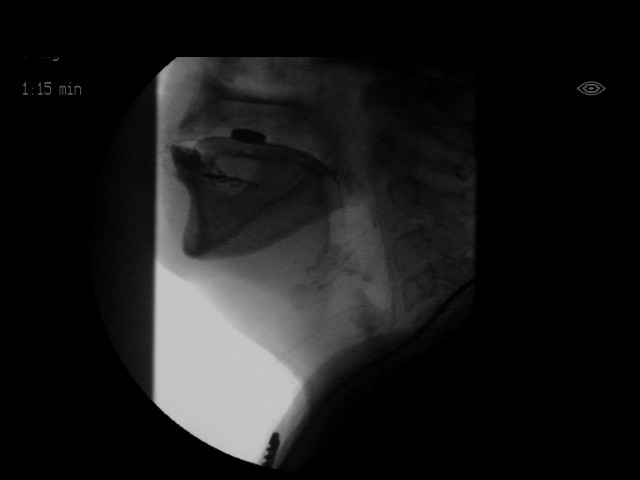
[im 11/12]
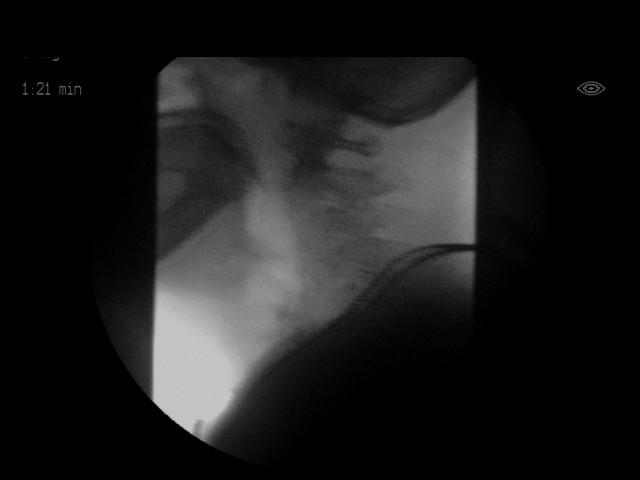
[im 11/12]
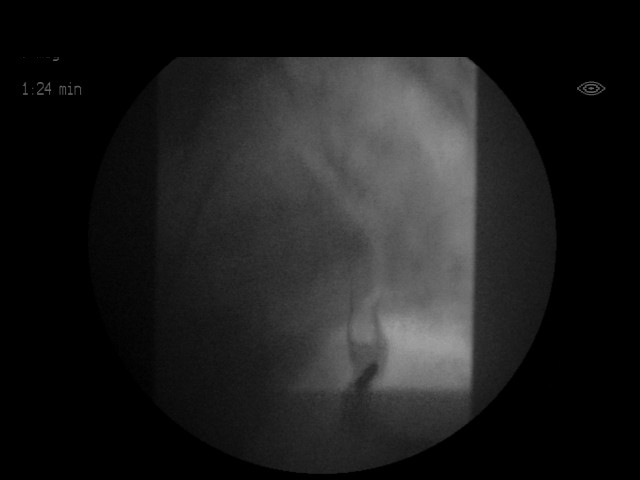
[im 12/12]
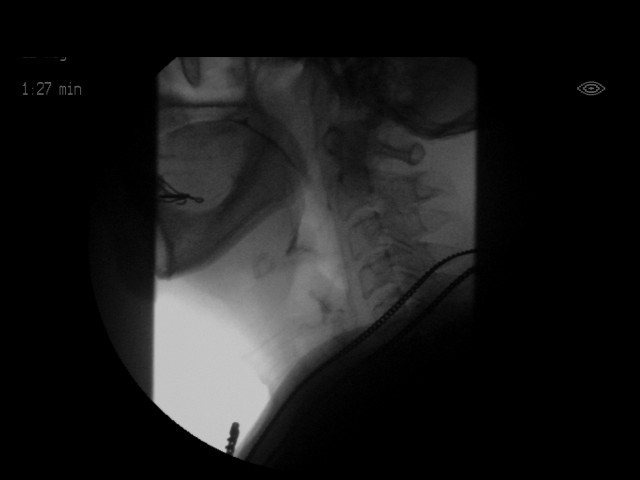

[17 of 24 positions shown; findings below may reference images not displayed]

FINDINGS: Thin liquid- delayed swallow trigger with piriform sinus spill.

Markus?Mollers within normal limits

Markus?Aryal with cracker- within normal limits

Barium tablet -  within normal limits.
IMPRESSION: Minimal swallow dysfunction as detailed above.

Please refer to the Speech Pathologists report for complete details
and recommendations.

## 2017-06-08 NOTE — Therapy (Signed)
[facility] [address] [place], [place], [address] Phone: [phone]   Fax:  [phone]  Physical Therapy Treatment  Patient Details  Name: [name] MRN: [id number] Date of Birth: 1941/05/17 Referring Provider: Claudette Laws, MD  Encounter Date: 06/08/2017      PT End of Session - 06/08/17 0849    Visit Number 29   Number of Visits 41   Date for PT Re-Evaluation 06/27/17   Authorization Type Medicare & G-codes with proress note every 10th visit    Authorization Time Period 9/11 end of certification for pelvic floor PT; 9/14 end of certification for neuro PT   PT Start Time 0846   PT Stop Time 0933   PT Time Calculation (min) 47 min   Activity Tolerance Patient tolerated treatment well   Behavior During Therapy [facility] for tasks assessed/performed      Past Medical History:  Diagnosis Date  . Atrial fibrillation (HCC)   . Chronic back pain    "mid back down into lower back" (08/25/2014)  . Frequent falls   . GERD (gastroesophageal reflux disease)   . Hypertriglyceridemia   . OSA on CPAP   . Osteoarthritis    "knees, hands, back" (08/25/2014)  . Pneumonia ~ 2000 X 1  . Subdural hematoma (HCC) july 2015   S/P fall while on Coumadin  . T12 compression fracture (HCC) 2012  . Type II diabetes mellitus (HCC)     Past Surgical History:  Procedure Laterality Date  . APPENDECTOMY  2012  . CATARACT EXTRACTION W/ INTRAOCULAR LENS  IMPLANT, BILATERAL Bilateral 2000's  . IR GENERIC HISTORICAL  10/15/2016   IR PERCUTANEOUS ART THROMBECTOMY/INFUSION INTRACRANIAL INC DIAG ANGIO 10/15/2016 [name], MD [facility]  . IR GENERIC HISTORICAL  12/13/2016   IR RADIOLOGIST EVAL & MGMT 12/13/2016 [facility]  . RADIOLOGY WITH ANESTHESIA N/A 10/15/2016   Procedure: RADIOLOGY WITH ANESTHESIA;  Surgeon: [name], MD;  Location: [facility] OR;  Service: Radiology;  Laterality: N/A;  . TOTAL ABDOMINAL HYSTERECTOMY   1990    There were no vitals filed for this visit.      Subjective Assessment - 06/08/17 0849    Subjective Had to get up a few times at night the last several days.     Patient is accompained by: Family member;Interpreter   Pertinent History falls with subdural hematoma, Afib, chronic back pain, T12 compression fx, type 2 DM, OA, PNA   Patient Stated Goals to go to the bathroom less frequently   Currently in Pain? No/denies                         [facility] Adult PT Treatment/Exercise - 06/08/17 0001      Neuro Re-ed    Neuro Re-ed Details  tactile cues for pelvic floor and abdominal contraction     Lumbar Exercises: Standing   Other Standing Lumbar Exercises slowly shifting weight into single leg with pelvic floor contraction     Lumbar Exercises: Seated   Sit to Stand 10 reps  verbal and visual cues      Lumbar Exercises: Supine   Ab Set 10 reps  with PF contract   Bridge 10 reps;5 seconds  holding PF and abdominals   Large Ball Abdominal Isometric --   Other Supine Lumbar Exercises --                PT Education - 06/08/17 2778  Education provided Yes   Education Details bride, sit to stand, and single leg with PF contract   Person(s) Educated Patient;Caregiver(s);Other (comment)  daughter and interpreter   Methods Explanation;Handout;Verbal cues;Tactile cues;Demonstration   Comprehension Verbalized understanding;Returned demonstration          PT Short Term Goals - 06/08/17 0853      PT SHORT TERM GOAL #7   Title able to perform pelvic floor contraction 3 sec hold for 10 reps due to improved muscle endurance and ability to relax pelvic floor muscles fully between reps for improved muscle function to control bladder   Time 4   Period Weeks   Status On-going     PT SHORT TERM GOAL #8   Title able to use urge to void techniques correctly for report of greater bladder control and 25% reduction in urinary frequency   Baseline 3 x  during the day, at night   Time 4   Period Weeks   Status Achieved           PT Long Term Goals - 06/08/17 4098      PT LONG TERM GOAL #7   Title FOTO< or = to 44%limited urinary problem survey   Time 12   Period Weeks   Status On-going     PT LONG TERM GOAL #8   Title reduced nocturia to 2x/night   Baseline 2-3x   Time 12   Period Weeks   Status On-going     PT LONG TERM GOAL  #9   TITLE reduced pad use to 2/day   Time 12   Period Weeks   Status Achieved     PT LONG TERM GOAL  #10   TITLE redued urinary frequency to void every 3 hours for less frequent trips to the bathroom and able to wait until at least 8 seconds of full stream of urine is eliminated   Time 12   Period Weeks   Status On-going               Plan - 06/08/17 0936    Clinical Impression Statement Pt continues to do well and is able to contract PF and abdominal needing some tactile cues to engage muscles.  Pt needs skilled therapy to progress into more functional movements and positions.   PT Treatment/Interventions ADLs/Self Care Home Management;Canalith Repostioning;Electrical Stimulation;DME Instruction;Gait training;Stair training;Functional mobility training;Therapeutic activities;Therapeutic exercise;Balance training;Neuromuscular re-education;Cognitive remediation;Patient/family education;Orthotic Fit/Training;Energy conservation;Taping;Vestibular;Biofeedback;Cryotherapy;Moist Heat;Ultrasound;Manual techniques   PT Next Visit Plan f/u with perineum rolling at night, continue PF and core strengthening in different positions   Consulted and Agree with Plan of Care Patient;Family member/caregiver;Other (Comment)      Patient will benefit from skilled therapeutic intervention in order to improve the following deficits and impairments:  Abnormal gait, Decreased endurance, Decreased balance, Decreased cognition, Decreased mobility, Decreased strength, Difficulty walking, Dizziness, Impaired  sensation, Pain, Postural dysfunction, Increased muscle spasms, Decreased safety awareness  Visit Diagnosis: Muscle weakness (generalized)  Other muscle spasm  Other lack of coordination     Problem List Patient Active Problem List   Diagnosis Date Noted  . Primary osteoarthritis of left knee 02/09/2017  . SDH (subdural hematoma) (HCC)   . Urinary frequency   . Type 2 diabetes mellitus with peripheral neuropathy (HCC)   . Cough   . Gait disturbance, post-stroke 10/20/2016  . Hemiparesis affecting left side as late effect of stroke (HCC) 10/20/2016  . Essential hypertension 10/19/2016  . Hyperlipidemia LDL goal <70 10/19/2016  . 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  HCC) 10/19/2016  . Left hemiparesis (HCC)   . Atrial fibrillation (HCC)   . History of fall   . History of traumatic subdural hematoma   . Vascular headache   . Hypokalemia   . Leukocytosis   . Acute blood loss anemia   . Hypoalbuminemia due to protein-calorie malnutrition (HCC)   . Dysphagia, post-stroke   . Acute respiratory failure (HCC)   . Acute embolic stroke (HCC) - R putamen/caudate and R insular infarcts s/p TICI3 revascularization w/ mechanical thrombectomy d/t AF not on [facility] 10/15/2016  . Pressure injury of skin 10/15/2016  . HCAP (healthcare-associated pneumonia) 11/09/2014  . Weakness 08/25/2014  . Acute bronchitis 08/25/2014  . UTI (lower urinary tract infection) 08/25/2014  . Fracture of lumbar spine (HCC) 08/25/2014  . Subdural hematoma, post-traumatic (HCC) 05/12/2014  . Chronic atrial fibrillation (HCC) 05/12/2014  . Diabetes mellitus (HCC) 05/12/2014  . OSA on CPAP 05/12/2014    Vincente Poli, PT 06/08/2017, 10:50 AM  [facility] [address] [place], [place], [address] Phone: [phone]   Fax:  [phone]  Name: [name] MRN: [id number] Date of Birth: 05-Aug-1941

## 2017-06-08 NOTE — Patient Instructions (Signed)
Bridge    Lie back, legs bent. Inhale, pressing hips up, make sure you push extra hard with left side. Keeping ribs in, lengthen lower back. Exhale, rolling down along spine from top. Repeat _10___ times. Do __1__ sessions per day.  http://pm.exer.us/55   Copyright  VHI. All rights reserved.    Contract pelvic floor before standing - stand up and back down then relax between sets - do 10 reps     Shift weight into left side and contract pelvic floor and hold for 5 seconds - repeat 10x      Roller to roll on labia and around panty region at night for 3 minutes before bed to reduce night time frequency

## 2017-06-09 ENCOUNTER — Ambulatory Visit: Payer: Medicare Other

## 2017-06-09 DIAGNOSIS — M6281 Muscle weakness (generalized): Secondary | ICD-10-CM

## 2017-06-09 DIAGNOSIS — R2689 Other abnormalities of gait and mobility: Secondary | ICD-10-CM

## 2017-06-09 DIAGNOSIS — R278 Other lack of coordination: Secondary | ICD-10-CM

## 2017-06-09 DIAGNOSIS — R2681 Unsteadiness on feet: Secondary | ICD-10-CM

## 2017-06-09 NOTE — Therapy (Addendum)
[facility] [address] [place], [place], [address] Phone: [phone]   Fax:  [phone]  Physical Therapy Treatment  Patient Details  Name: Katurah Karapetian MRN: [id number] Date of Birth: [birth year] Referring Provider: Claudette Laws, MD  Encounter Date: 06/09/2017      PT End of Session - 06/09/17 1027    Visit Number 30   Number of Visits 41   Date for PT Re-Evaluation 06/27/17   Authorization Time Period 9/11 end of certification for pelvic floor PT; 9/14 end of certification for neuro PT   PT Start Time 0933   PT Stop Time 1016   PT Time Calculation (min) 43 min   Equipment Utilized During Treatment Gait belt   Activity Tolerance Patient tolerated treatment well   Behavior During Therapy [facility] for tasks assessed/performed      Past Medical History:  Diagnosis Date  . Atrial fibrillation (HCC)   . Chronic back pain    "mid back down into lower back" (08/25/2014)  . Frequent falls   . GERD (gastroesophageal reflux disease)   . Hypertriglyceridemia   . OSA on CPAP   . Osteoarthritis    "knees, hands, back" (08/25/2014)  . Pneumonia ~ 2000 X 1  . Subdural hematoma (HCC) july 2015   S/P fall while on Coumadin  . T12 compression fracture (HCC) 2012  . Type II diabetes mellitus (HCC)     Past Surgical History:  Procedure Laterality Date  . APPENDECTOMY  2012  . CATARACT EXTRACTION W/ INTRAOCULAR LENS  IMPLANT, BILATERAL Bilateral 2000's  . IR GENERIC HISTORICAL  10/15/2016   IR PERCUTANEOUS ART THROMBECTOMY/INFUSION INTRACRANIAL INC DIAG ANGIO 10/15/2016 [name], MD [facility]  . IR GENERIC HISTORICAL  12/13/2016   IR RADIOLOGIST EVAL & MGMT 12/13/2016 [facility]  . RADIOLOGY WITH ANESTHESIA N/A 10/15/2016   Procedure: RADIOLOGY WITH ANESTHESIA;  Surgeon: [name], MD;  Location: [facility] OR;  Service: Radiology;  Laterality: N/A;  . TOTAL ABDOMINAL HYSTERECTOMY  1990    There were no  vitals filed for this visit.      Subjective Assessment - 06/09/17 0936    Subjective Pt's dtr reported pt was amb. with quad cane and almost fell due to unsteadiness and dtr assisted pt.    Patient is accompained by: Family member;Interpreter   Pertinent History falls with subdural hematoma, Afib, chronic back pain, T12 compression fx, type 2 DM, OA, PNA   Patient Stated Goals to go to the bathroom less frequently   Currently in Pain? No/denies                         [facility] Adult PT Treatment/Exercise - 06/09/17 0932      Ambulation/Gait   Ambulation/Gait Yes   Ambulation/Gait Assistance 4: Min guard   Ambulation/Gait Assistance Details Cues to improve sequencing with SBQC, improve L heel strike, stride length and weight shifting (laterally) during turns.    Ambulation Distance (Feet) 2 Feet  20' and 10'   Assistive device Small based quad cane   Gait Pattern Step-through pattern;Decreased dorsiflexion - left;Decreased stride length;Decreased step length - left;Decreased dorsiflexion - right;Poor foot clearance - left;Shuffle   Ambulation Surface Level;Indoor     Standardized Balance Assessment   Standardized Balance Assessment Berg Balance Test;Dynamic Gait Index;Timed Up and Go Test     Berg Balance Test   Sit to Stand Able to stand without using hands and stabilize independently   Standing  Unsupported Able to stand safely 2 minutes   Sitting with Back Unsupported but Feet Supported on Floor or Stool Able to sit safely and securely 2 minutes   Stand to Sit Sits safely with minimal use of hands   Transfers Able to transfer safely, minor use of hands   Standing Unsupported with Eyes Closed Able to stand 10 seconds safely   Standing Ubsupported with Feet Together Able to place feet together independently and stand 1 minute safely   From Standing, Reach Forward with Outstretched Arm Can reach forward >12 cm safely (5")  6-7" with R and L UEs   From Standing  Position, Pick up Object from Floor Able to pick up shoe safely and easily   From Standing Position, Turn to Look Behind Over each Shoulder Looks behind one side only/other side shows less weight shift   Turn 360 Degrees Able to turn 360 degrees safely but slowly   Standing Unsupported, Alternately Place Feet on Step/Stool Able to stand independently and safely and complete 8 steps in 20 seconds   Standing Unsupported, One Foot in Front Able to plae foot ahead of the other independently and hold 30 seconds   Standing on One Leg Able to lift leg independently and hold > 10 seconds  RLE: >10 sec., LLE: 5sec.   Total Score 51     Dynamic Gait Index   Level Surface Mild Impairment   Change in Gait Speed Moderate Impairment   Gait with Horizontal Head Turns Moderate Impairment   Gait with Vertical Head Turns Mild Impairment   Gait and Pivot Turn Moderate Impairment   Step Over Obstacle Mild Impairment   Step Around Obstacles Mild Impairment   Steps Moderate Impairment   Total Score 12     Timed Up and Go Test   TUG Normal TUG   Normal TUG (seconds) 17.9  without AD and 23.8 sec. with rollator, decr. DF s AD                PT Education - 06/09/17 1026    Education provided Yes   Education Details PT discussed the importance of using rollator for safety. PT discussed outcome measure results. PT educated pt on sequencing with SBQC.   Person(s) Educated Patient;Caregiver(s);Other (comment)  interpreter   Methods Explanation;Demonstration;Tactile cues;Verbal cues   Comprehension Verbalized understanding;Returned demonstration;Need further instruction               PT Long Term Goals - 06/15/17 1218      PT LONG TERM GOAL #1   Title Patient will demonstrate independence with HEP and verbalize options for community fitness program.    Time 4   Period Weeks   Status Revised   Target Date 06/30/17     PT LONG TERM GOAL #3   Title Patient will score > or = 52/56 on the  Berg Balance Test to indicate decreased falls risk.    Baseline 50/56    Time 4   Period Weeks   Status Revised   Target Date 06/30/17     PT LONG TERM GOAL #4   Title Pt will decrease falls risk with sit <> stand and turning as indicated by TUG score of <20 with rollator   Baseline 29 seconds with rollator/<29 seconds with quad cane   Time 4   Period Weeks   Status Revised   Target Date 06/30/17     PT LONG TERM GOAL #5   Title Patient will ambulate 500 feet  outside over uneven terrain including ramps/curbs with rollator safely with supervision with improved foot clearance and upright posture.    Time 4   Period Weeks   Status Revised   Target Date 06/30/17              Plan - June 24, 2017 1028    Clinical Impression Statement Pt demostrated progress as BERG score improved to 51/56 but continues to indicate pt is at moderate risk for falls. Pt's TUG time improved both with and without an AD, but continues to indicate pt is at risk for falls. PT had pt perform DGI based on BERG progress, pt's DGI score indicates pt is at risk for falls. Pt continues to require cues and demo during amb. with SBQC. Pt would continue to benefit from skilled PT to improve safety during functional mobility.    PT Treatment/Interventions ADLs/Self Care Home Management;Canalith Repostioning;Electrical Stimulation;DME Instruction;Gait training;Stair training;Functional mobility training;Therapeutic activities;Therapeutic exercise;Balance training;Neuromuscular re-education;Cognitive remediation;Patient/family education;Orthotic Fit/Training;Energy conservation;Taping;Vestibular;Biofeedback;Cryotherapy;Moist Heat;Ultrasound;Manual techniques   PT Next Visit Plan Continue gait training with SBQC, high level balance activities.    Consulted and Agree with Plan of Care Patient;Family member/caregiver;Other (Comment)      Patient will benefit from skilled therapeutic intervention in order to improve the  following deficits and impairments:  Abnormal gait, Decreased endurance, Decreased balance, Decreased cognition, Decreased mobility, Decreased strength, Difficulty walking, Dizziness, Impaired sensation, Pain, Postural dysfunction, Increased muscle spasms, Decreased safety awareness  Visit Diagnosis: Other abnormalities of gait and mobility  Unsteadiness on feet  Other lack of coordination  Muscle weakness (generalized)       G-Codes - 06/24/2017 1032    Functional Assessment Tool Used (Outpatient Only) BERG: 51/56; TUG without AD: 17.9 sec. and 23.8 AD with rollator, DGI: 21/24   Functional Limitation Mobility: Walking and moving around   Mobility: Walking and Moving Around Current Status [phone]) At least 1 percent but less than 20 percent impaired, limited or restricted   Mobility: Walking and Moving Around Goal Status [phone]) At least 1 percent but less than 20 percent impaired, limited or restricted      Problem List Patient Active Problem List   Diagnosis Date Noted  . Primary osteoarthritis of left knee 02/09/2017  . SDH (subdural hematoma) (HCC)   . Urinary frequency   . Type 2 diabetes mellitus with peripheral neuropathy (HCC)   . Cough   . Gait disturbance, post-stroke 10/20/2016  . Hemiparesis affecting left side as late effect of stroke (HCC) 10/20/2016  . Essential hypertension 10/19/2016  . Hyperlipidemia LDL goal <70 10/19/2016  . 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  . Left hemiparesis (HCC)   . Atrial fibrillation (HCC)   . History of fall   . History of traumatic subdural hematoma   . Vascular headache   . Hypokalemia   . Leukocytosis   . Acute blood loss anemia   . Hypoalbuminemia due to protein-calorie malnutrition (HCC)   . Dysphagia, post-stroke   . Acute respiratory failure (HCC)   . Acute embolic stroke (HCC) - R putamen/caudate and R insular infarcts s/p TICI3 revascularization w/ mechanical thrombectomy d/t AF not on [facility] 10/15/2016  . Pressure  injury of skin 10/15/2016  . HCAP (healthcare-associated pneumonia) 11/09/2014  . Weakness 08/25/2014  . Acute bronchitis 08/25/2014  . UTI (lower urinary tract infection) 08/25/2014  . Fracture of lumbar spine (HCC) 08/25/2014  . Subdural hematoma, post-traumatic (HCC) 05/12/2014  . Chronic atrial fibrillation (HCC) 05/12/2014  . Diabetes mellitus (HCC) 05/12/2014  . OSA on CPAP  05/12/2014    Tanis Burnley L 06/09/2017, 10:33 AM  [facility] [address] [place], [place], [address] Phone: [phone]   Fax:  [phone]  Name: Niajah Sipos MRN: [id number] Date of Birth: 1940/10/31   Physical Therapy Progress Note  Dates of Reporting Period: 04/26/17 to 06/09/17  Objective Reports of Subjective Statement: Pt continues to require cues during amb. With [facility] and guarding 2/2 incr. Postural sway and gait deviations.  Objective Measurements: Please see above.  Goal Update:       PT Long Term Goals - 06/15/17 1218      PT LONG TERM GOAL #1   Title Patient will demonstrate independence with HEP and verbalize options for community fitness program.    Time 4   Period Weeks   Status Revised   Target Date 06/30/17     PT LONG TERM GOAL #3   Title Patient will score > or = 52/56 on the Berg Balance Test to indicate decreased falls risk.    Baseline 50/56    Time 4   Period Weeks   Status Revised   Target Date 06/30/17     PT LONG TERM GOAL #4   Title Pt will decrease falls risk with sit <> stand and turning as indicated by TUG score of <20 with rollator   Baseline 29 seconds with rollator/<29 seconds with quad cane   Time 4   Period Weeks   Status Revised   Target Date 06/30/17     PT LONG TERM GOAL #5   Title Patient will ambulate 500 feet outside over uneven terrain including ramps/curbs with rollator safely with supervision with improved foot clearance and upright posture.    Time 4   Period Weeks   Status  Revised   Target Date 06/30/17          PT Long Term Goals - 06/08/17 0937      PT LONG TERM GOAL #7   Title FOTO< or = to 44%limited urinary problem survey   Time 12   Period Weeks   Status On-going     PT LONG TERM GOAL #8   Title reduced nocturia to 2x/night   Baseline 2-3x   Time 12   Period Weeks   Status On-going     PT LONG TERM GOAL  #9   TITLE reduced pad use to 2/day   Time 12   Period Weeks   Status Achieved     PT LONG TERM GOAL  #10   TITLE redued urinary frequency to void every 3 hours for less frequent trips to the bathroom and able to wait until at least 8 seconds of full stream of urine is eliminated   Time 12   Period Weeks   Status On-going       Plan: Continue gait training, balance training, and strengthening/endurance trainging.  Reason Skilled Services are Required: To improve safety during functional mobility. Pt also seeing pelvic health PT.    Zerita Boers, PT,DPT 06/09/17 10:36 AM Phone: [phone] Fax: [phone]

## 2017-06-13 ENCOUNTER — Ambulatory Visit: Payer: Medicare Other | Admitting: Physical Therapy

## 2017-06-13 ENCOUNTER — Encounter: Payer: Self-pay | Admitting: Physical Therapy

## 2017-06-13 DIAGNOSIS — R278 Other lack of coordination: Secondary | ICD-10-CM

## 2017-06-13 DIAGNOSIS — M62838 Other muscle spasm: Secondary | ICD-10-CM

## 2017-06-13 DIAGNOSIS — M6281 Muscle weakness (generalized): Secondary | ICD-10-CM | POA: Diagnosis not present

## 2017-06-13 DIAGNOSIS — R2689 Other abnormalities of gait and mobility: Secondary | ICD-10-CM

## 2017-06-13 DIAGNOSIS — R2681 Unsteadiness on feet: Secondary | ICD-10-CM

## 2017-06-13 NOTE — Therapy (Signed)
[facility] [address] [place], [place], [address] Phone: [phone]   Fax:  [phone]  Physical Therapy Treatment  Patient Details  Name: [name] MRN: [id number] Date of Birth: [birth year] Referring Provider: Claudette Laws, MD  Encounter Date: 06/13/2017      PT End of Session - 06/13/17 1105    Visit Number 32   Number of Visits 41   Date for PT Re-Evaluation 06/27/17   Authorization Type Medicare & G-codes with proress note every 10th visit    Authorization Time Period 9/11 end of certification for pelvic floor PT; 9/14 end of certification for neuro PT   PT Start Time 1103   PT Stop Time 1144   PT Time Calculation (min) 41 min   Equipment Utilized During Treatment Gait belt   Activity Tolerance Patient tolerated treatment well   Behavior During Therapy [facility] for tasks assessed/performed      Past Medical History:  Diagnosis Date  . Atrial fibrillation (HCC)   . Chronic back pain    "mid back down into lower back" (08/25/2014)  . Frequent falls   . GERD (gastroesophageal reflux disease)   . Hypertriglyceridemia   . OSA on CPAP   . Osteoarthritis    "knees, hands, back" (08/25/2014)  . Pneumonia ~ 2000 X 1  . Subdural hematoma (HCC) july 2015   S/P fall while on Coumadin  . T12 compression fracture (HCC) 2012  . Type II diabetes mellitus (HCC)     Past Surgical History:  Procedure Laterality Date  . APPENDECTOMY  2012  . CATARACT EXTRACTION W/ INTRAOCULAR LENS  IMPLANT, BILATERAL Bilateral 2000's  . IR GENERIC HISTORICAL  10/15/2016   IR PERCUTANEOUS ART THROMBECTOMY/INFUSION INTRACRANIAL INC DIAG ANGIO 10/15/2016 [name], MD [facility]INTER[facility]  . IR GENERIC HISTORICAL  12/13/2016   IR RADIOLOGIST EVAL & MGMT 12/13/2016 [facility]  . RADIOLOGY WITH ANESTHESIA N/A 10/15/2016   Procedure: RADIOLOGY WITH ANESTHESIA;  Surgeon: [name], MD;  Location: [facility] OR;  Service: Radiology;   Laterality: N/A;  . TOTAL ABDOMINAL HYSTERECTOMY  1990    There were no vitals filed for this visit.      Subjective Assessment - 06/13/17 1105    Subjective No new complaitns. No falls or pain to reprot. Does report fatigue. Has been unsing the rollator all the time since last session per daughter.    Patient is accompained by: Family member;Interpreter   Pertinent History falls with subdural hematoma, Afib, chronic back pain, T12 compression fx, type 2 DM, OA, PNA   Limitations Other (comment)   Patient Stated Goals to treat her weak legs and walk without AD   Currently in Pain? No/denies   Pain Score 0-No pain             [facility] Adult PT Treatment/Exercise - 06/13/17 1109      Transfers   Transfers Sit to Stand;Stand to Sit   Sit to Stand 5: Supervision;With upper extremity assist;From bed;From chair/3-in-1   Stand to Sit 5: Supervision;With upper extremity assist;To bed;To chair/3-in-1     Ambulation/Gait   Ambulation/Gait Yes   Ambulation/Gait Assistance 4: Min guard   Ambulation/Gait Assistance Details first lap with small based quad cane with mod/max cues on sequencing. pt noted to only place 2 feet on floor vs all 4 feet despite cues. used cane with rubber quad tip on next 2 laps with improved safety with cane use, continued to need mod/max cues with hand over hand  placement for cane advancement for ~50 feet to assist with sequencing carryover/understanding.    Ambulation Distance (Feet) 115 Feet  x3 reps totat   Assistive device Small based quad cane;Straight cane  straight cane with rubber quad tip   Gait Pattern Step-through pattern;Decreased stride length;Decreased step length - left;Poor foot clearance - left   Ambulation Surface Level;Indoor             Balance Exercises - 06/13/17 1139      Balance Exercises: Standing   Rockerboard Anterior/posterior;Lateral;Head turns;EO;EC;30 seconds;10 reps   Balance Beam standing across blue beam in parallel bars:  alternating fwd stepping to floor and back onto beam x 10-12 reps each leg with cues for increased step length/height, and for weight shifting to assist with balance. min assist with intermittent to light UE support on bars for balance.                             Balance Exercises: Standing   Rebounder Limitations performed both ways on balance board: EO rocking board with empahsis on tall posture with min assist and no UE support; holding board steady: EC no head movements, progressing to EC head movements left<>right and up<>down with min assist, no UE support. cues on posture, base of support and weight shifting to assist with balance.                              PT Short Term Goals - 06/13/17 1015      PT SHORT TERM GOAL #8   Title able to use urge to void techniques correctly for report of greater bladder control and 25% reduction in urinary frequency   Baseline 3 x during the day, at night   Time 4   Period Weeks   Status Achieved           PT Long Term Goals - 06/13/17 1017      PT LONG TERM GOAL #7   Title FOTO< or = to 44%limited urinary problem survey   Time 12   Period Weeks   Status On-going     PT LONG TERM GOAL #8   Title reduced nocturia to 2x/night   Time 12   Period Weeks   Status On-going     PT LONG TERM GOAL  #9   TITLE reduced pad use to 2/day   Baseline 2/day and not wet but still changing the pads   Time 12   Period Weeks   Status Achieved     PT LONG TERM GOAL  #10   TITLE redued urinary frequency to void every 3 hours for less frequent trips to the bathroom and able to wait until at least 8 seconds of full stream of urine is eliminated   Baseline frequency is good during day, but volume is not there   Time 12   Period Weeks   Status On-going            Plan - 06/13/17 1106    Clinical Impression Statement today's skilled session continued to address gait with lesser device. Pt noted to not use quad cane safely with gait, keeping  max of 2 points on ground during gait. Switched to straight cane with rubber quad tip with improved safety noted with use, continued to need cues on sequencing. Min guard assist for balance with both devices with no balance loss during session.  Remainder of session addressed balance reactions with no issues reported. Pt is progressing toward goals and should benefit from continued PT to progress toward unmet goals.                           PT Treatment/Interventions ADLs/Self Care Home Management;Canalith Repostioning;Electrical Stimulation;DME Instruction;Gait training;Stair training;Functional mobility training;Therapeutic activities;Therapeutic exercise;Balance training;Neuromuscular re-education;Cognitive remediation;Patient/family education;Orthotic Fit/Training;Energy conservation;Taping;Vestibular;Biofeedback;Cryotherapy;Moist Heat;Ultrasound;Manual techniques   PT Next Visit Plan Continue gait training with SBQC/straight cane with rubber quad tip, high level balance activities.    Consulted and Agree with Plan of Care Patient;Family member/caregiver;Other (Comment)   Family Member Consulted daughter      Patient will benefit from skilled therapeutic intervention in order to improve the following deficits and impairments:  Abnormal gait, Decreased endurance, Decreased balance, Decreased cognition, Decreased mobility, Decreased strength, Difficulty walking, Dizziness, Impaired sensation, Pain, Postural dysfunction, Increased muscle spasms, Decreased safety awareness  Visit Diagnosis: Muscle weakness (generalized)  Other abnormalities of gait and mobility  Unsteadiness on feet     Problem List Patient Active Problem List   Diagnosis Date Noted  . Primary osteoarthritis of left knee 02/09/2017  . SDH (subdural hematoma) (HCC)   . Urinary frequency   . Type 2 diabetes mellitus with peripheral neuropathy (HCC)   . Cough   . Gait disturbance, post-stroke 10/20/2016  . Hemiparesis  affecting left side as late effect of stroke (HCC) 10/20/2016  . Essential hypertension 10/19/2016  . Hyperlipidemia LDL goal <70 10/19/2016  . 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  . Left hemiparesis (HCC)   . Atrial fibrillation (HCC)   . History of fall   . History of traumatic subdural hematoma   . Vascular headache   . Hypokalemia   . Leukocytosis   . Acute blood loss anemia   . Hypoalbuminemia due to protein-calorie malnutrition (HCC)   . Dysphagia, post-stroke   . Acute respiratory failure (HCC)   . Acute embolic stroke (HCC) - R putamen/caudate and R insular infarcts s/p TICI3 revascularization w/ mechanical thrombectomy d/t AF not on [facility] 10/15/2016  . Pressure injury of skin 10/15/2016  . HCAP (healthcare-associated pneumonia) 11/09/2014  . Weakness 08/25/2014  . Acute bronchitis 08/25/2014  . UTI (lower urinary tract infection) 08/25/2014  . Fracture of lumbar spine (HCC) 08/25/2014  . Subdural hematoma, post-traumatic (HCC) 05/12/2014  . Chronic atrial fibrillation (HCC) 05/12/2014  . Diabetes mellitus (HCC) 05/12/2014  . OSA on CPAP 05/12/2014    Sallyanne Kuster, PTA, [facility] [address] [place], [place] [address] [phone] 06/13/17, 12:46 PM   Name: [name] MRN: [id number] Date of Birth: 02-27-1941

## 2017-06-13 NOTE — Therapy (Signed)
[facility] [address], [address] [place], [place], [address] Phone: [phone]   Fax:  [phone]  Physical Therapy Treatment  Patient Details  Name: [name] MRN: [id number] Date of Birth: [birth year] Referring Provider: Claudette Laws, MD  Encounter Date: 06/13/2017      PT End of Session - 06/13/17 0939    Visit Number 31   Number of Visits 41   Date for PT Re-Evaluation 06/27/17   Authorization Type Medicare & G-codes with proress note every 10th visit    PT Start Time 0932   PT Stop Time 1016   PT Time Calculation (min) 44 min   Activity Tolerance Patient tolerated treatment well   Behavior During Therapy [facility] for tasks assessed/performed      Past Medical History:  Diagnosis Date  . Atrial fibrillation (HCC)   . Chronic back pain    "mid back down into lower back" (08/25/2014)  . Frequent falls   . GERD (gastroesophageal reflux disease)   . Hypertriglyceridemia   . OSA on CPAP   . Osteoarthritis    "knees, hands, back" (08/25/2014)  . Pneumonia ~ 2000 X 1  . Subdural hematoma (HCC) july 2015   S/P fall while on Coumadin  . T12 compression fracture (HCC) 2012  . Type II diabetes mellitus (HCC)     Past Surgical History:  Procedure Laterality Date  . APPENDECTOMY  2012  . CATARACT EXTRACTION W/ INTRAOCULAR LENS  IMPLANT, BILATERAL Bilateral 2000's  . IR GENERIC HISTORICAL  10/15/2016   IR PERCUTANEOUS ART THROMBECTOMY/INFUSION INTRACRANIAL INC DIAG ANGIO 10/15/2016 [name], MD [facility]  . IR GENERIC HISTORICAL  12/13/2016   IR RADIOLOGIST EVAL & MGMT 12/13/2016 [facility]  . RADIOLOGY WITH ANESTHESIA N/A 10/15/2016   Procedure: RADIOLOGY WITH ANESTHESIA;  Surgeon: [name], MD;  Location: [facility] OR;  Service: Radiology;  Laterality: N/A;  . TOTAL ABDOMINAL HYSTERECTOMY  1990    There were no vitals filed for this visit.      Subjective Assessment - 06/13/17 0934    Subjective Had more noodle soup and tomato based.  Wonders if that is why she going so much.  7x during the day and 3x at night is average of 2 days.  States she is using  2 pads/day.  Reports overall it is much better.   Patient is accompained by: Family member;Interpreter   Pertinent History falls with subdural hematoma, Afib, chronic back pain, T12 compression fx, type 2 DM, OA, PNA   Limitations Other (comment)   Patient Stated Goals to go to the bathroom less frequently   Currently in Pain? No/denies                         [facility] Adult PT Treatment/Exercise - 06/13/17 0001      Lumbar Exercises: Standing   Other Standing Lumbar Exercises shifting weight with 5 sec hold, contracing PF      Lumbar Exercises: Seated   Long Arc Quad on Chair Strengthening;Right;Left;20 reps;Weights   LAQ on Chair Weights (lbs) 2   LAQ on Chair Limitations cues to contract pelvic floor while lifting leg   Sit to Stand Limitations sitting on green pball - cues to contract and press into      Lumbar Exercises: Supine   Ab Set 5 reps  with PF contract and ball squeeze - 10 sec  PT Short Term Goals - 06/13/17 1015      PT SHORT TERM GOAL #8   Title able to use urge to void techniques correctly for report of greater bladder control and 25% reduction in urinary frequency   Baseline 3 x during the day, at night   Time 4   Period Weeks   Status Achieved           PT Long Term Goals - 06/13/17 1017      PT LONG TERM GOAL #7   Title FOTO< or = to 44%limited urinary problem survey   Time 12   Period Weeks   Status On-going     PT LONG TERM GOAL #8   Title reduced nocturia to 2x/night   Time 12   Period Weeks   Status On-going     PT LONG TERM GOAL  #9   TITLE reduced pad use to 2/day   Baseline 2/day and not wet but still changing the pads   Time 12   Period Weeks   Status Achieved     PT LONG TERM GOAL  #10   TITLE redued urinary frequency  to void every 3 hours for less frequent trips to the bathroom and able to wait until at least 8 seconds of full stream of urine is eliminated   Baseline frequency is good during day, but volume is not there   Time 12   Period Weeks   Status On-going               Plan - 06/13/17 1023    Clinical Impression Statement Patient did well with more standing exercises.  She is making progress and reports no wetting her pads even though she is still changing them.  Pt fatigued with exercises and needs cues to put more pressure on the left side with sit to stand.  Pt will benefit from skilled PT to continue working on muscle coordination for improved bladder control throughout the day.   PT Treatment/Interventions ADLs/Self Care Home Management;Canalith Repostioning;Electrical Stimulation;DME Instruction;Gait training;Stair training;Functional mobility training;Therapeutic activities;Therapeutic exercise;Balance training;Neuromuscular re-education;Cognitive remediation;Patient/family education;Orthotic Fit/Training;Energy conservation;Taping;Vestibular;Biofeedback;Cryotherapy;Moist Heat;Ultrasound;Manual techniques   Consulted and Agree with Plan of Care Patient;Family member/caregiver;Other (Comment)   Family Member Consulted daughter      Patient will benefit from skilled therapeutic intervention in order to improve the following deficits and impairments:  Abnormal gait, Decreased endurance, Decreased balance, Decreased cognition, Decreased mobility, Decreased strength, Difficulty walking, Dizziness, Impaired sensation, Pain, Postural dysfunction, Increased muscle spasms, Decreased safety awareness  Visit Diagnosis: Other lack of coordination  Muscle weakness (generalized)  Other muscle spasm     Problem List Patient Active Problem List   Diagnosis Date Noted  . Primary osteoarthritis of left knee 02/09/2017  . SDH (subdural hematoma) (HCC)   . Urinary frequency   . Type 2 diabetes  mellitus with peripheral neuropathy (HCC)   . Cough   . Gait disturbance, post-stroke 10/20/2016  . Hemiparesis affecting left side as late effect of stroke (HCC) 10/20/2016  . Essential hypertension 10/19/2016  . Hyperlipidemia LDL goal <70 10/19/2016  . 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  . Left hemiparesis (HCC)   . Atrial fibrillation (HCC)   . History of fall   . History of traumatic subdural hematoma   . Vascular headache   . Hypokalemia   . Leukocytosis   . Acute blood loss anemia   . Hypoalbuminemia due to protein-calorie malnutrition (HCC)   . Dysphagia, post-stroke   . Acute respiratory failure (  HCC)   . Acute embolic stroke (HCC) - R putamen/caudate and R insular infarcts s/p TICI3 revascularization w/ mechanical thrombectomy d/t AF not on [facility] 10/15/2016  . Pressure injury of skin 10/15/2016  . HCAP (healthcare-associated pneumonia) 11/09/2014  . Weakness 08/25/2014  . Acute bronchitis 08/25/2014  . UTI (lower urinary tract infection) 08/25/2014  . Fracture of lumbar spine (HCC) 08/25/2014  . Subdural hematoma, post-traumatic (HCC) 05/12/2014  . Chronic atrial fibrillation (HCC) 05/12/2014  . Diabetes mellitus (HCC) 05/12/2014  . OSA on CPAP 05/12/2014    Vincente Poli, PT 06/13/2017, 10:39 AM  [facility] [address] [place], [place], [address] Phone: [phone]   Fax:  [phone]  Name: Lamis Mccully MRN: [id number] Date of Birth: [birth year]

## 2017-06-15 ENCOUNTER — Ambulatory Visit: Payer: Medicare Other | Admitting: Physical Therapy

## 2017-06-15 VITALS — BP 130/80 | HR 81 | Resp 28

## 2017-06-15 DIAGNOSIS — M6281 Muscle weakness (generalized): Secondary | ICD-10-CM

## 2017-06-15 DIAGNOSIS — R42 Dizziness and giddiness: Secondary | ICD-10-CM

## 2017-06-15 DIAGNOSIS — R296 Repeated falls: Secondary | ICD-10-CM

## 2017-06-15 DIAGNOSIS — I69354 Hemiplegia and hemiparesis following cerebral infarction affecting left non-dominant side: Secondary | ICD-10-CM

## 2017-06-15 DIAGNOSIS — R2689 Other abnormalities of gait and mobility: Secondary | ICD-10-CM

## 2017-06-15 DIAGNOSIS — R2681 Unsteadiness on feet: Secondary | ICD-10-CM

## 2017-06-15 NOTE — Therapy (Signed)
[facility] [address] [place], [place], [address] Phone: [phone]   Fax:  [phone]  Physical Therapy Treatment  Patient Details  Name: [name] MRN: [id number] Date of Birth: May 04, 1941 Referring Provider: Claudette Laws, MD  Encounter Date: 06/15/2017      PT End of Session - 06/15/17 1214    Visit Number 33   Number of Visits 41   Date for PT Re-Evaluation 06/27/17   Authorization Type Medicare & G-codes with proress note every 10th visit    Authorization Time Period 9/11 end of certification for pelvic floor PT; 9/14 end of certification for neuro PT   PT Start Time 1107   PT Stop Time 1150   PT Time Calculation (min) 43 min   Activity Tolerance Other (comment)  anxiety, hyperventilation   Behavior During Therapy [facility] for tasks assessed/performed      Past Medical History:  Diagnosis Date  . Atrial fibrillation (HCC)   . Chronic back pain    "mid back down into lower back" (08/25/2014)  . Frequent falls   . GERD (gastroesophageal reflux disease)   . Hypertriglyceridemia   . OSA on CPAP   . Osteoarthritis    "knees, hands, back" (08/25/2014)  . Pneumonia ~ 2000 X 1  . Subdural hematoma (HCC) july 2015   S/P fall while on Coumadin  . T12 compression fracture (HCC) 2012  . Type II diabetes mellitus (HCC)     Past Surgical History:  Procedure Laterality Date  . APPENDECTOMY  2012  . CATARACT EXTRACTION W/ INTRAOCULAR LENS  IMPLANT, BILATERAL Bilateral 2000's  . IR GENERIC HISTORICAL  10/15/2016   IR PERCUTANEOUS ART THROMBECTOMY/INFUSION INTRACRANIAL INC DIAG ANGIO 10/15/2016 [name], MD [facility]-INTERV RAD  . IR GENERIC HISTORICAL  12/13/2016   IR RADIOLOGIST EVAL & MGMT 12/13/2016 [facility]  . RADIOLOGY WITH ANESTHESIA N/A 10/15/2016   Procedure: RADIOLOGY WITH ANESTHESIA;  Surgeon: [name], MD;  Location: [facility] OR;  Service: Radiology;  Laterality: N/A;  . TOTAL ABDOMINAL  HYSTERECTOMY  1990    Vitals:   06/15/17 1112 06/15/17 1204  BP: 130/80 130/80  Pulse: 81 81  Resp: (!) 28   SpO2: 99% 96%        Subjective Assessment - 06/15/17 1120    Subjective Pt reports feeling very SOB in waiting area and small amount of sternal pain.  Pt also noted to be flushed.  Denies weakness, dizziness/lightheadedness, headache.  Pt is anxious about visiting her son tomorrow.   Patient is accompained by: Family member;Interpreter   Pertinent History falls with subdural hematoma, Afib, chronic back pain, T12 compression fx, type 2 DM, OA, PNA   Limitations Other (comment)   Patient Stated Goals to treat her weak legs and walk without AD   Currently in Pain? No/denies                         [facility] Adult PT Treatment/Exercise - 06/15/17 1204      Self-Care   Self-Care Other Self-Care Comments   Other Self-Care Comments  In supine with trunk supported on 3-4 pillows to elevate head/chest performed pursed lip (panic) breathing to slow respiratory rate, to deepend inhale and prolong exhale for greater parasympathetic activation and decrease anxiety.  Pt required constant verbal, visual and tactile cues to sequence pursed lip breathing, use of chanting to increase exhalation and use of tissue in front of mouth to have pt flutter, 3 sec  inhale, 4-6 sec exhale.  Added in deep breathing with exercises: 10 reps bridges with exhale, 10 reps bilat LE hip ER/ABD with exhale.                  PT Education - 06/15/17 1214    Education provided Yes   Education Details pursed lip breathing and anxiety   Person(s) Educated Patient;Child(ren)   Methods Explanation;Demonstration;Tactile cues;Verbal cues   Comprehension Verbalized understanding;Verbal cues required;Tactile cues required;Need further instruction          PT Short Term Goals - 06/13/17 1015      PT SHORT TERM GOAL #8   Title able to use urge to void techniques correctly for report of greater  bladder control and 25% reduction in urinary frequency   Baseline 3 x during the day, at night   Time 4   Period Weeks   Status Achieved           PT Long Term Goals - 06/15/17 1218      PT LONG TERM GOAL #1   Title Patient will demonstrate independence with HEP and verbalize options for community fitness program.    Time 4   Period Weeks   Status Revised   Target Date 06/30/17     PT LONG TERM GOAL #3   Title Patient will score > or = 52/56 on the Berg Balance Test to indicate decreased falls risk.    Baseline 50/56    Time 4   Period Weeks   Status Revised   Target Date 06/30/17     PT LONG TERM GOAL #4   Title Pt will decrease falls risk with sit <> stand and turning as indicated by TUG score of <20 with rollator   Baseline 29 seconds with rollator/<29 seconds with quad cane   Time 4   Period Weeks   Status Revised   Target Date 06/30/17     PT LONG TERM GOAL #5   Title Patient will ambulate 500 feet outside over uneven terrain including ramps/curbs with rollator safely with supervision with improved foot clearance and upright posture.    Time 4   Period Weeks   Status Revised   Target Date 06/30/17               Plan - 06/15/17 1217    Clinical Impression Statement Pt presenting with signs and symptoms of anxiety and hyperventilation today with increased respiratory rate and shallow breathing.  No other signs or symptoms of cardiopulmonary symptoms that would warrant emergency attention.  Denied pain that radiated into UE, back or jaw, vitals stable, no productive cough, no lightheadedness or weakness.  Treatment session focused on pursed lip breathing to decrease anxiety and respiratory rate and improve perfusion.  Combined breathing with functional exercises.  At end of session pt reported improvement in SOB, vitals remained stable and pt did not experience an increase in pain.    Rehab Potential Good   Clinical Impairments Affecting Rehab Potential pain  of chronic nature   PT Frequency 3x / week   PT Duration 4 weeks   PT Treatment/Interventions ADLs/Self Care Home Management;Canalith Repostioning;Electrical Stimulation;DME Instruction;Gait training;Stair training;Functional mobility training;Therapeutic activities;Therapeutic exercise;Balance training;Neuromuscular re-education;Cognitive remediation;Patient/family education;Orthotic Fit/Training;Energy conservation;Taping;Vestibular;Biofeedback;Cryotherapy;Moist Heat;Ultrasound;Manual techniques   PT Next Visit Plan high level balance, gait outside with rollator, balance with turning   Consulted and Agree with Plan of Care Patient;Family member/caregiver;Other (Comment)   Family Member Consulted daughter      Patient will benefit from  skilled therapeutic intervention in order to improve the following deficits and impairments:  Abnormal gait, Decreased endurance, Decreased balance, Decreased cognition, Decreased mobility, Decreased strength, Difficulty walking, Dizziness, Impaired sensation, Pain, Postural dysfunction, Increased muscle spasms, Decreased safety awareness  Visit Diagnosis: Muscle weakness (generalized)  Unsteadiness on feet  Other abnormalities of gait and mobility  Hemiplegia and hemiparesis following cerebral infarction affecting left non-dominant side (HCC)  Repeated falls  Dizziness and giddiness     Problem List Patient Active Problem List   Diagnosis Date Noted  . Primary osteoarthritis of left knee 02/09/2017  . SDH (subdural hematoma) (HCC)   . Urinary frequency   . Type 2 diabetes mellitus with peripheral neuropathy (HCC)   . Cough   . Gait disturbance, post-stroke 10/20/2016  . Hemiparesis affecting left side as late effect of stroke (HCC) 10/20/2016  . Essential hypertension 10/19/2016  . Hyperlipidemia LDL goal <70 10/19/2016  . 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  . Left hemiparesis (HCC)   . Atrial fibrillation (HCC)   . History of fall   .  History of traumatic subdural hematoma   . Vascular headache   . Hypokalemia   . Leukocytosis   . Acute blood loss anemia   . Hypoalbuminemia due to protein-calorie malnutrition (HCC)   . Dysphagia, post-stroke   . Acute respiratory failure (HCC)   . Acute embolic stroke (HCC) - R putamen/caudate and R insular infarcts s/p TICI3 revascularization w/ mechanical thrombectomy d/t AF not on [facility] 10/15/2016  . Pressure injury of skin 10/15/2016  . HCAP (healthcare-associated pneumonia) 11/09/2014  . Weakness 08/25/2014  . Acute bronchitis 08/25/2014  . UTI (lower urinary tract infection) 08/25/2014  . Fracture of lumbar spine (HCC) 08/25/2014  . Subdural hematoma, post-traumatic (HCC) 05/12/2014  . Chronic atrial fibrillation (HCC) 05/12/2014  . Diabetes mellitus (HCC) 05/12/2014  . OSA on CPAP 05/12/2014    Edman Circle, PT, DPT 06/15/17    12:30 PM    [facility] [address] [place], [place], [address] Phone: [phone]   Fax:  [phone]  Name: Taysia Rivere MRN: [id number] Date of Birth: 09/19/1941

## 2017-06-22 ENCOUNTER — Ambulatory Visit: Payer: Medicare Other | Attending: Physical Medicine & Rehabilitation

## 2017-06-22 DIAGNOSIS — R2689 Other abnormalities of gait and mobility: Secondary | ICD-10-CM | POA: Diagnosis present

## 2017-06-22 DIAGNOSIS — R278 Other lack of coordination: Secondary | ICD-10-CM | POA: Insufficient documentation

## 2017-06-22 DIAGNOSIS — M6281 Muscle weakness (generalized): Secondary | ICD-10-CM | POA: Diagnosis present

## 2017-06-22 DIAGNOSIS — R296 Repeated falls: Secondary | ICD-10-CM | POA: Diagnosis present

## 2017-06-22 DIAGNOSIS — I69354 Hemiplegia and hemiparesis following cerebral infarction affecting left non-dominant side: Secondary | ICD-10-CM | POA: Insufficient documentation

## 2017-06-22 DIAGNOSIS — R2681 Unsteadiness on feet: Secondary | ICD-10-CM | POA: Diagnosis present

## 2017-06-22 NOTE — Therapy (Signed)
[facility] [address] [place], [place], [address] Phone: [phone]   Fax:  [phone]  Physical Therapy Treatment  Patient Details  Name: [name] MRN: [id number] Date of Birth: May 05, 1941 Referring Provider: Claudette Laws, MD  Encounter Date: 06/22/2017      PT End of Session - 06/22/17 1153    Visit Number 34   Number of Visits 41   Date for PT Re-Evaluation 06/30/17   Authorization Type Medicare & G-codes with proress note every 10th visit    Authorization Time Period 9/11 end of certification for pelvic floor PT; 9/14 end of certification for neuro PT   PT Start Time 1104   PT Stop Time 1144   PT Time Calculation (min) 40 min   Equipment Utilized During Treatment Gait belt   Activity Tolerance Patient tolerated treatment well   Behavior During Therapy [facility] for tasks assessed/performed      Past Medical History:  Diagnosis Date  . Atrial fibrillation (HCC)   . Chronic back pain    "mid back down into lower back" (08/25/2014)  . Frequent falls   . GERD (gastroesophageal reflux disease)   . Hypertriglyceridemia   . OSA on CPAP   . Osteoarthritis    "knees, hands, back" (08/25/2014)  . Pneumonia ~ 2000 X 1  . Subdural hematoma (HCC) july 2015   S/P fall while on Coumadin  . T12 compression fracture (HCC) 2012  . Type II diabetes mellitus (HCC)     Past Surgical History:  Procedure Laterality Date  . APPENDECTOMY  2012  . CATARACT EXTRACTION W/ INTRAOCULAR LENS  IMPLANT, BILATERAL Bilateral 2000's  . IR GENERIC HISTORICAL  10/15/2016   IR PERCUTANEOUS ART THROMBECTOMY/INFUSION INTRACRANIAL INC DIAG ANGIO 10/15/2016 [name], MD [facility]  . IR GENERIC HISTORICAL  12/13/2016   IR RADIOLOGIST EVAL & MGMT 12/13/2016 [facility]  . RADIOLOGY WITH ANESTHESIA N/A 10/15/2016   Procedure: RADIOLOGY WITH ANESTHESIA;  Surgeon: [name], MD;  Location: [facility] OR;  Service: Radiology;   Laterality: N/A;  . TOTAL ABDOMINAL HYSTERECTOMY  1990    There were no vitals filed for this visit.      Subjective Assessment - 06/22/17 1108    Subjective Pt is feeling better today. Pt denied falls or changes since last visit. At end of session pt's dtr asked if pt could practice wide steps at next session, as she experienced LOB while traversing steps at her son's home last weekend but corrected balance with stepping strategy.    Patient is accompained by: Family member  interpreter in lobby, as pt's dtr and pt feel fine with pt's dtr interpreting   Pertinent History falls with subdural hematoma, Afib, chronic back pain, T12 compression fx, type 2 DM, OA, PNA   Patient Stated Goals to treat her weak legs and walk without AD   Currently in Pain? No/denies                         [facility] Adult PT Treatment/Exercise - 06/22/17 1109      High Level Balance   High Level Balance Activities Side stepping;Backward walking;Head turns;Marching forwards   High Level Balance Comments Pt performed over non-compliant and compliant (red mats) surfaces: 4-6x/activity, except marches performed on non-compliant surfaces only. Cues and demo for technique. Pt required two seated rest breaks 2/2 fatigue LBP. Pt reported 5/10 dizziness during head nods over red mats and required standing rest break to  allow dizziness to subside.                   PT Short Term Goals - 06/22/17 1157      PT SHORT TERM GOAL #1   Title = LTG with 4 week recertification     PT SHORT TERM GOAL #8   Baseline 3 x during the day, at night   Time 4           PT Long Term Goals - 06/22/17 1158      PT LONG TERM GOAL #1   Title Patient will demonstrate independence with HEP and verbalize options for community fitness program.    Time 4   Period Weeks   Status Revised     PT LONG TERM GOAL #3   Title Patient will score > or = 52/56 on the Berg Balance Test to indicate decreased falls risk.     Baseline 50/56    Time 4   Period Weeks   Status Revised     PT LONG TERM GOAL #4   Title Pt will decrease falls risk with sit <> stand and turning as indicated by TUG score of <20 with rollator   Baseline 29 seconds with rollator/<29 seconds with quad cane   Time 4   Period Weeks   Status Revised     PT LONG TERM GOAL #5   Title Patient will ambulate 500 feet outside over uneven terrain including ramps/curbs with rollator safely with supervision with improved foot clearance and upright posture.    Time 4   Period Weeks   Status Revised               Plan - 06/22/17 1153    Clinical Impression Statement Pt demonstrated progress, as she was able to perform high level balance activities, over non-compliant and compliant surfaces. Pt did experience 5/10 dizziness during head nods over compliant surfaces, which indicates decr. vestibular input. Pt would continue to benefit from skilled PT to improve safety during functional mobility.    Rehab Potential Good   Clinical Impairments Affecting Rehab Potential pain of chronic nature   PT Frequency 3x / week   PT Duration 4 weeks   PT Treatment/Interventions ADLs/Self Care Home Management;Canalith Repostioning;Electrical Stimulation;DME Instruction;Gait training;Stair training;Functional mobility training;Therapeutic activities;Therapeutic exercise;Balance training;Neuromuscular re-education;Cognitive remediation;Patient/family education;Orthotic Fit/Training;Energy conservation;Taping;Vestibular;Biofeedback;Cryotherapy;Moist Heat;Ultrasound;Manual techniques   PT Next Visit Plan Wide steps, provide pt with handout of head turns/nods at counter for HEP, high level balance, gait outside with rollator, balance with turning   Consulted and Agree with Plan of Care Patient;Family member/caregiver;Other (Comment)   Family Member Consulted daughter      Patient will benefit from skilled therapeutic intervention in order to improve the  following deficits and impairments:  Abnormal gait, Decreased endurance, Decreased balance, Decreased cognition, Decreased mobility, Decreased strength, Difficulty walking, Dizziness, Impaired sensation, Pain, Postural dysfunction, Increased muscle spasms, Decreased safety awareness  Visit Diagnosis: Other abnormalities of gait and mobility  Unsteadiness on feet  Muscle weakness (generalized)     Problem List Patient Active Problem List   Diagnosis Date Noted  . Primary osteoarthritis of left knee 02/09/2017  . SDH (subdural hematoma) (HCC)   . Urinary frequency   . Type 2 diabetes mellitus with peripheral neuropathy (HCC)   . Cough   . Gait disturbance, post-stroke 10/20/2016  . Hemiparesis affecting left side as late effect of stroke (HCC) 10/20/2016  . Essential hypertension 10/19/2016  . Hyperlipidemia LDL goal <70 10/19/2016  .  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  . Left hemiparesis (HCC)   . Atrial fibrillation (HCC)   . History of fall   . History of traumatic subdural hematoma   . Vascular headache   . Hypokalemia   . Leukocytosis   . Acute blood loss anemia   . Hypoalbuminemia due to protein-calorie malnutrition (HCC)   . Dysphagia, post-stroke   . Acute respiratory failure (HCC)   . Acute embolic stroke (HCC) - R putamen/caudate and R insular infarcts s/p TICI3 revascularization w/ mechanical thrombectomy d/t AF not on [facility] 10/15/2016  . Pressure injury of skin 10/15/2016  . HCAP (healthcare-associated pneumonia) 11/09/2014  . Weakness 08/25/2014  . Acute bronchitis 08/25/2014  . UTI (lower urinary tract infection) 08/25/2014  . Fracture of lumbar spine (HCC) 08/25/2014  . Subdural hematoma, post-traumatic (HCC) 05/12/2014  . Chronic atrial fibrillation (HCC) 05/12/2014  . Diabetes mellitus (HCC) 05/12/2014  . OSA on CPAP 05/12/2014    Cordarius Benning L 06/22/2017, 11:59 AM  [facility] [address]  [address] [place], [place], [address] Phone: [phone]   Fax:  [phone]  Name: [name] MRN: [id number] Date of Birth: 06-19-1941  Zerita Boers, PT,DPT 06/22/17 11:59 AM Phone: [phone] Fax: [phone]

## 2017-06-23 ENCOUNTER — Ambulatory Visit: Payer: Medicare Other | Attending: Urology | Admitting: Physical Therapy

## 2017-06-23 ENCOUNTER — Encounter: Payer: Self-pay | Admitting: Physical Therapy

## 2017-06-23 ENCOUNTER — Ambulatory Visit: Payer: Medicare Other

## 2017-06-23 DIAGNOSIS — M6281 Muscle weakness (generalized): Secondary | ICD-10-CM | POA: Insufficient documentation

## 2017-06-23 DIAGNOSIS — R2689 Other abnormalities of gait and mobility: Secondary | ICD-10-CM

## 2017-06-23 DIAGNOSIS — R2681 Unsteadiness on feet: Secondary | ICD-10-CM

## 2017-06-23 DIAGNOSIS — R278 Other lack of coordination: Secondary | ICD-10-CM | POA: Diagnosis present

## 2017-06-23 DIAGNOSIS — I69354 Hemiplegia and hemiparesis following cerebral infarction affecting left non-dominant side: Secondary | ICD-10-CM

## 2017-06-23 NOTE — Therapy (Addendum)
[facility] [address] [place], [place], [address] Phone: [phone]   Fax:  [phone]  Physical Therapy Treatment  Patient Details  Name: [name] MRN: [id number] Date of Birth: [birth year] Referring Provider: Claudette Laws, MD  Encounter Date: 06/23/2017      PT End of Session - 06/23/17 1154    Visit Number 36   Number of Visits 41   Date for PT Re-Evaluation 06/30/17   Authorization Type Medicare & G-codes with proress note every 10th visit    Authorization Time Period 9/11 end of certification for pelvic floor PT; 9/14 end of certification for neuro PT   PT Start Time 1106   PT Stop Time 1147   PT Time Calculation (min) 41 min   Equipment Utilized During Treatment Gait belt   Activity Tolerance Patient tolerated treatment well   Behavior During Therapy [employer] for tasks assessed/performed      Past Medical History:  Diagnosis Date  . Atrial fibrillation (HCC)   . Chronic back pain    "mid back down into lower back" (08/25/2014)  . Frequent falls   . GERD (gastroesophageal reflux disease)   . Hypertriglyceridemia   . OSA on CPAP   . Osteoarthritis    "knees, hands, back" (08/25/2014)  . Pneumonia ~ 2000 X 1  . Subdural hematoma (HCC) july 2015   S/P fall while on Coumadin  . T12 compression fracture (HCC) 2012  . Type II diabetes mellitus (HCC)     Past Surgical History:  Procedure Laterality Date  . APPENDECTOMY  2012  . CATARACT EXTRACTION W/ INTRAOCULAR LENS  IMPLANT, BILATERAL Bilateral 2000's  . IR GENERIC HISTORICAL  10/15/2016   IR PERCUTANEOUS ART THROMBECTOMY/INFUSION INTRACRANIAL INC DIAG ANGIO 10/15/2016 [name], MD [facility]  . IR GENERIC HISTORICAL  12/13/2016   IR RADIOLOGIST EVAL & MGMT 12/13/2016 [facility]  . RADIOLOGY WITH ANESTHESIA N/A 10/15/2016   Procedure: RADIOLOGY WITH ANESTHESIA;  Surgeon: [name], MD;  Location: [facility] OR;  Service: Radiology;   Laterality: N/A;  . TOTAL ABDOMINAL HYSTERECTOMY  1990    There were no vitals filed for this visit.      Subjective Assessment - 06/23/17 1110    Subjective Pt denied falls or changes since last visit. Pt saw pelvic health PT this morning.    Patient is accompained by: Interpreter;Family member   Pertinent History falls with subdural hematoma, Afib, chronic back pain, T12 compression fx, type 2 DM, OA, PNA   Patient Stated Goals to treat her weak legs and walk without AD; reduce urinary leakage   Currently in Pain? No/denies                         [facility] Adult PT Treatment/Exercise - 06/23/17 1111      Ambulation/Gait   Ambulation/Gait Yes   Ambulation/Gait Assistance 5: Supervision   Ambulation/Gait Assistance Details Cues to improve stride length and heel strike. Cues to improve weight shifting while traversing inclines/declines and to stay within rollator while amb. on incline/declines.    Ambulation Distance (Feet) 75 Feet  20'x2 all indoors and 400' outside   Assistive device Rollator   Gait Pattern Step-through pattern;Decreased stride length;Decreased step length - left;Poor foot clearance - left   Ambulation Surface Level;Indoor   Stairs Yes   Stairs Assistance 4: Min assist;4: Min guard   Stairs Assistance Details (indicate cue type and reason) Wide steps (approx. 15") to mimic pt's  son's steps at his house,   Stair Management Technique No rails;Two rails;Step to pattern;Alternating pattern;Forwards   Number of Stairs 2  x4 reps   Height of Stairs 6  and 8"     High Level Balance   High Level Balance Activities Head turns   High Level Balance Comments Performed at counter 6x10'/activity with tactile and VC's for technique. Please see pt instructions for HEP details.  Pt reported 2/10 dizziness during head turns and less dizziness during head nods.                 PT Education - 06/23/17 1153    Education provided Yes   Education Details  PT provided pt with balance HEP.   Person(s) Educated Patient;Child(ren)   Methods Explanation;Demonstration;Tactile cues;Verbal cues;Handout   Comprehension Returned demonstration;Verbalized understanding;Need further instruction          PT Short Term Goals - 06/23/17 1804      PT SHORT TERM GOAL #1   Title = LTG with 4 week recertification                PT Long Term Goals - 06/23/17 1803      PT LONG TERM GOAL #1   Title Patient will demonstrate independence with HEP and verbalize options for community fitness program.    Time 4   Period Weeks   Status Revised     PT LONG TERM GOAL #3   Title Patient will score > or = 52/56 on the Berg Balance Test to indicate decreased falls risk.    Baseline 50/56    Time 4   Period Weeks   Status Revised     PT LONG TERM GOAL #4   Title Pt will decrease falls risk with sit <> stand and turning as indicated by TUG score of <20 with rollator   Baseline 29 seconds with rollator/<29 seconds with quad cane   Time 4   Period Weeks   Status Revised     PT LONG TERM GOAL #5   Title Patient will ambulate 500 feet outside over uneven terrain including ramps/curbs with rollator safely with supervision with improved foot clearance and upright posture.    Time 4   Period Weeks   Status Revised               Plan - 06/23/17 1154    Clinical Impression Statement Pt able to tolerate balance/gait HEP well, as dizziness remained 2/10 or less during session. However, pt continues to require frequent cues for technique and requires her dtr's assistance for carryover. Pt continues to require cues to decr. gait deviations during amb., especially during amb. outside over uneven terrain. Continue with POC.    Rehab Potential Good   Clinical Impairments Affecting Rehab Potential pain of chronic nature   PT Frequency 3x / week   PT Duration 4 weeks   PT Treatment/Interventions ADLs/Self Care Home Management;Canalith  Repostioning;Electrical Stimulation;DME Instruction;Gait training;Stair training;Functional mobility training;Therapeutic activities;Therapeutic exercise;Balance training;Neuromuscular re-education;Cognitive remediation;Patient/family education;Orthotic Fit/Training;Energy conservation;Taping;Vestibular;Biofeedback;Cryotherapy;Moist Heat;Ultrasound;Manual techniques   PT Next Visit Plan Assess LTGs and decide upon renewal or d/c.    PT Home Exercise Plan progress as needed   Consulted and Agree with Plan of Care Patient;Family member/caregiver;Other (Comment)   Family Member Consulted daughter      Patient will benefit from skilled therapeutic intervention in order to improve the following deficits and impairments:  Abnormal gait, Decreased endurance, Decreased balance, Decreased cognition, Decreased mobility, Decreased strength, Difficulty walking, Dizziness,  Impaired sensation, Pain, Postural dysfunction, Increased muscle spasms, Decreased safety awareness  Visit Diagnosis: Other abnormalities of gait and mobility  Unsteadiness on feet  Hemiplegia and hemiparesis following cerebral infarction affecting left non-dominant side [facility])     Problem List Patient Active Problem List   Diagnosis Date Noted  . Primary osteoarthritis of left knee 02/09/2017  . SDH (subdural hematoma) (HCC)   . Urinary frequency   . Type 2 diabetes mellitus with peripheral neuropathy (HCC)   . Cough   . Gait disturbance, post-stroke 10/20/2016  . Hemiparesis affecting left side as late effect of stroke (HCC) 10/20/2016  . Essential hypertension 10/19/2016  . Hyperlipidemia LDL goal <70 10/19/2016  . 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  . Left hemiparesis (HCC)   . Atrial fibrillation (HCC)   . History of fall   . History of traumatic subdural hematoma   . Vascular headache   . Hypokalemia   . Leukocytosis   . Acute blood loss anemia   . Hypoalbuminemia due to protein-calorie malnutrition (HCC)   .  Dysphagia, post-stroke   . Acute respiratory failure (HCC)   . Acute embolic stroke (HCC) - R putamen/caudate and R insular infarcts s/p TICI3 revascularization w/ mechanical thrombectomy d/t AF not on [facility] 10/15/2016  . Pressure injury of skin 10/15/2016  . HCAP (healthcare-associated pneumonia) 11/09/2014  . Weakness 08/25/2014  . Acute bronchitis 08/25/2014  . UTI (lower urinary tract infection) 08/25/2014  . Fracture of lumbar spine (HCC) 08/25/2014  . Subdural hematoma, post-traumatic (HCC) 05/12/2014  . Chronic atrial fibrillation (HCC) 05/12/2014  . Diabetes mellitus (HCC) 05/12/2014  . OSA on CPAP 05/12/2014    Fronnie Urton L 06/23/2017, 6:04 PM  [facility] [address] [place], [place], [address] Phone: [phone]   Fax:  [phone]  Name: Ahna Konkle MRN: [id number] Date of Birth: 1940-11-24  Zerita Boers, PT,DPT 06/23/17 6:04 PM Phone: [phone] Fax: [phone]

## 2017-06-23 NOTE — Patient Instructions (Addendum)
Up / Down Head Motion    Perform at kitchen counter with one hand holding counter for safety. Walking on solid surface, move head and eyes toward ceiling for __2__ steps.  Then, move head and eyes toward floor for __2__ steps. Repeat __4__ times per session. Do __1__ sessions per day.   Copyright  VHI. All rights reserved.  Side to Side Head Motion    Perform at kitchen counter with one hand holding counter for safety. Walking on solid surface, turn head and eyes to left for __2__ steps.  Then, turn head and eyes to opposite side for __2__ steps. Repeat sequence __4__ times per session. Do __1__ sessions per day.  Copyright  VHI. All rights reserved.

## 2017-06-23 NOTE — Therapy (Signed)
[facility] [address] [place], [place], [address] Phone: [phone]   Fax:  [phone]  Physical Therapy Treatment  Patient Details  Name: [name] MRN: [id number] Date of Birth: 05/07/1941 Referring Provider: Claudette LawsAndrew Kirsteins, MD  Encounter Date: 06/23/2017      PT End of Session - 06/23/17 0821    Visit Number 35   Date for PT Re-Evaluation 06/30/17   Authorization Type Medicare & G-codes with proress note every 10th visit    Authorization Time Period 9/11 end of certification for pelvic floor PT; 9/14 end of certification for neuro PT   PT Start Time 0807  came late   PT Stop Time 0845   PT Time Calculation (min) 38 min   Activity Tolerance Patient tolerated treatment well   Behavior During Therapy [facility] for tasks assessed/performed      Past Medical History:  Diagnosis Date  . Atrial fibrillation (HCC)   . Chronic back pain    "mid back down into lower back" (08/25/2014)  . Frequent falls   . GERD (gastroesophageal reflux disease)   . Hypertriglyceridemia   . OSA on CPAP   . Osteoarthritis    "knees, hands, back" (08/25/2014)  . Pneumonia ~ 2000 X 1  . Subdural hematoma (HCC) july 2015   S/P fall while on Coumadin  . T12 compression fracture (HCC) 2012  . Type II diabetes mellitus (HCC)     Past Surgical History:  Procedure Laterality Date  . APPENDECTOMY  2012  . CATARACT EXTRACTION W/ INTRAOCULAR LENS  IMPLANT, BILATERAL Bilateral 2000's  . IR GENERIC HISTORICAL  10/15/2016   IR PERCUTANEOUS ART THROMBECTOMY/INFUSION INTRACRANIAL INC DIAG ANGIO 10/15/2016 Julieanne CottonSanjeev Deveshwar, MD [facility]  . IR GENERIC HISTORICAL  12/13/2016   IR RADIOLOGIST EVAL & MGMT 12/13/2016 [facility]  . RADIOLOGY WITH ANESTHESIA N/A 10/15/2016   Procedure: RADIOLOGY WITH ANESTHESIA;  Surgeon: Julieanne CottonSanjeev Deveshwar, MD;  Location: [facility] OR;  Service: Radiology;  Laterality: N/A;  . TOTAL ABDOMINAL HYSTERECTOMY  1990     There were no vitals filed for this visit.      Subjective Assessment - 06/23/17 0811    Subjective 2 pads per day and same wetness. At night the 50% better for the amount of times she has to go to the bathroom. Used to be 7 times and decreased to 3-4 times. Patient is able to wait 3 hours to urinate and makes it to the bathroom.    Patient is accompained by: Interpreter   Pertinent History falls with subdural hematoma, Afib, chronic back pain, T12 compression fx, type 2 DM, OA, PNA   Limitations Other (comment)   Patient Stated Goals to treat her weak legs and walk without AD; reduce urinary leakage   Currently in Pain? No/denies                         [facility] Adult PT Treatment/Exercise - 06/23/17 0001      Neuro Re-ed    Neuro Re-ed Details  sitting on a greenball- alternate shoulder flexion with pelvic floor contraction, lift heels up with tightening the pelvic floor; ; Seated ball squeeze with pelvic floor contraction     Lumbar Exercises: Seated   Long Arc Quad on Chair Strengthening;Right;Left;Weights;2 sets;10 reps   LAQ on Chair Weights (lbs) 3   LAQ on Chair Limitations with pelvic floor contraction and verbal cues to go slow   Sit to Stand Limitations sitting  on green pball - cues to contract and press into                   PT Short Term Goals - 06/22/17 1157      PT SHORT TERM GOAL #1   Title = LTG with 4 week recertification     PT SHORT TERM GOAL #8   Baseline 3 x during the day, at night   Time 4           PT Long Term Goals - 06/23/17 0844      PT LONG TERM GOAL  #9   TITLE reduced pad use to 2/day   Time 12   Period Weeks   Status Achieved     PT LONG TERM GOAL  #10   TITLE redued urinary frequency to void every 3 hours for less frequent trips to the bathroom and able to wait until at least 8 seconds of full stream of urine is eliminated   Time 12   Period Weeks   Status Achieved               Plan -  06/23/17 0839    Clinical Impression Statement Patient continues to wear 2 pads during the day.  Patient will wake up 2-3 times per night to urinate compared to 7 times.  Patient is able to wait 3 hours to urinate and is able to get to the bathroom.  Patient is doing well with her exercises.  Patient will benefit from skilled Pt to improve pelvic floor strength to reduce urinary leakage.    Rehab Potential Good   Clinical Impairments Affecting Rehab Potential pain of chronic nature   PT Frequency 3x / week   PT Duration 4 weeks   PT Treatment/Interventions ADLs/Self Care Home Management;Canalith Repostioning;Electrical Stimulation;DME Instruction;Gait training;Stair training;Functional mobility training;Therapeutic activities;Therapeutic exercise;Balance training;Neuromuscular re-education;Cognitive remediation;Patient/family education;Orthotic Fit/Training;Energy conservation;Taping;Vestibular;Biofeedback;Cryotherapy;Moist Heat;Ultrasound;Manual techniques   PT Next Visit Plan write renewal for pelvic floor or discharge; discuss with family; progress HEP   PT Home Exercise Plan progress as needed   Recommended Other Services second request to sign initial evaluation for pelvic floor sent   Consulted and Agree with Plan of Care Patient;Family member/caregiver;Other (Comment)   Family Member Consulted daughter      Patient will benefit from skilled therapeutic intervention in order to improve the following deficits and impairments:  Abnormal gait, Decreased endurance, Decreased balance, Decreased cognition, Decreased mobility, Decreased strength, Difficulty walking, Dizziness, Impaired sensation, Pain, Postural dysfunction, Increased muscle spasms, Decreased safety awareness  Visit Diagnosis: Muscle weakness (generalized)  Other lack of coordination     Problem List Patient Active Problem List   Diagnosis Date Noted  . Primary osteoarthritis of left knee 02/09/2017  . SDH (subdural  hematoma) (HCC)   . Urinary frequency   . Type 2 diabetes mellitus with peripheral neuropathy (HCC)   . Cough   . Gait disturbance, post-stroke 10/20/2016  . Hemiparesis affecting left side as late effect of stroke (HCC) 10/20/2016  . Essential hypertension 10/19/2016  . Hyperlipidemia LDL goal <70 10/19/2016  . 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  . Left hemiparesis (HCC)   . Atrial fibrillation (HCC)   . History of fall   . History of traumatic subdural hematoma   . Vascular headache   . Hypokalemia   . Leukocytosis   . Acute blood loss anemia   . Hypoalbuminemia due to protein-calorie malnutrition (HCC)   . Dysphagia, post-stroke   . Acute respiratory failure (HCC)   .  Acute embolic stroke (HCC) - R putamen/caudate and R insular infarcts s/p TICI3 revascularization w/ mechanical thrombectomy d/t AF not on [facility] 10/15/2016  . Pressure injury of skin 10/15/2016  . HCAP (healthcare-associated pneumonia) 11/09/2014  . Weakness 08/25/2014  . Acute bronchitis 08/25/2014  . UTI (lower urinary tract infection) 08/25/2014  . Fracture of lumbar spine (HCC) 08/25/2014  . Subdural hematoma, post-traumatic (HCC) 05/12/2014  . Chronic atrial fibrillation (HCC) 05/12/2014  . Diabetes mellitus (HCC) 05/12/2014  . OSA on CPAP 05/12/2014    Eulis Foster, PT 06/23/17 8:46 AM   [facility] [address], [address] [place], [place], [address] Phone: [phone]   Fax:  [phone]  Name: [name] MRN: [id number] Date of Birth: 06-11-1941

## 2017-06-26 ENCOUNTER — Ambulatory Visit: Payer: Medicare Other | Admitting: Physical Therapy

## 2017-06-26 ENCOUNTER — Encounter: Payer: Self-pay | Admitting: Physical Therapy

## 2017-06-26 ENCOUNTER — Ambulatory Visit: Payer: Medicare Other

## 2017-06-26 DIAGNOSIS — R278 Other lack of coordination: Secondary | ICD-10-CM

## 2017-06-26 DIAGNOSIS — R2689 Other abnormalities of gait and mobility: Secondary | ICD-10-CM | POA: Diagnosis not present

## 2017-06-26 DIAGNOSIS — M6281 Muscle weakness (generalized): Secondary | ICD-10-CM

## 2017-06-26 NOTE — Therapy (Signed)
[facility] [address], [address] [place], [place], [address] Phone: [phone]   Fax:  [phone]  Physical Therapy Treatment  Patient Details  Name: [name] MRN: [id number] Date of Birth: 09/01/1941 Referring Provider: Dr. Gaynelle Arabianonald Davis III  Encounter Date: 06/26/2017      PT End of Session - 06/26/17 1315    Visit Number 38   Date for PT Re-Evaluation 08/25/17   Authorization Type Medicare & G-codes with proress note every 10th visit    PT Start Time 1230   PT Stop Time 1310   PT Time Calculation (min) 40 min   Activity Tolerance Patient tolerated treatment well   Behavior During Therapy [facility] for tasks assessed/performed      Past Medical History:  Diagnosis Date  . Atrial fibrillation (HCC)   . Chronic back pain    "mid back down into lower back" (08/25/2014)  . Frequent falls   . GERD (gastroesophageal reflux disease)   . Hypertriglyceridemia   . OSA on CPAP   . Osteoarthritis    "knees, hands, back" (08/25/2014)  . Pneumonia ~ 2000 X 1  . Subdural hematoma (HCC) july 2015   S/P fall while on Coumadin  . T12 compression fracture (HCC) 2012  . Type II diabetes mellitus (HCC)     Past Surgical History:  Procedure Laterality Date  . APPENDECTOMY  2012  . CATARACT EXTRACTION W/ INTRAOCULAR LENS  IMPLANT, BILATERAL Bilateral 2000's  . IR GENERIC HISTORICAL  10/15/2016   IR PERCUTANEOUS ART THROMBECTOMY/INFUSION INTRACRANIAL INC DIAG ANGIO 10/15/2016 Julieanne CottonSanjeev Deveshwar, MD [facility]  . IR GENERIC HISTORICAL  12/13/2016   IR RADIOLOGIST EVAL & MGMT 12/13/2016 [facility]  . RADIOLOGY WITH ANESTHESIA N/A 10/15/2016   Procedure: RADIOLOGY WITH ANESTHESIA;  Surgeon: Julieanne CottonSanjeev Deveshwar, MD;  Location: [facility];  Service: Radiology;  Laterality: N/A;  . TOTAL ABDOMINAL HYSTERECTOMY  1990    There were no vitals filed for this visit.      Subjective Assessment - 06/26/17 1237    Subjective Patient wants to  continue therapy to work on pelvic floor strength.  Patient tried to not take her mediation to see if it is helping.  She did fill up her diaper when not taking it.    Patient is accompained by: Family member;Interpreter  daughter   Pertinent History falls with subdural hematoma, Afib, chronic back pain, T12 compression fx, type 2 DM, OA, PNA   Limitations Other (comment)   Patient Stated Goals to treat her weak legs and walk without AD; reduce urinary leakage   Currently in Pain? No/denies            [facility] - 06/26/17 1240      Assessment   Medical Diagnosis pelvic floor dysfunction   Referring Provider Dr. Gaynelle Arabianonald Davis III   Onset Date/Surgical Date 10/05/16   Prior Therapy No     Precautions   Precautions Fall     Restrictions   Weight Bearing Restrictions No     Balance Screen   Has the patient fallen in the past 6 months No   Has the patient had a decrease in activity level because of a fear of falling?  Yes   Is the patient reluctant to leave their home because of a fear of falling?  No     Home Environment   Living Environment Private residence   Living Arrangements Children   Available Help at Discharge Family   Type of  Home House   Home Access Stairs to enter   Entrance Stairs-Number of Steps 3 from garage   Entrance Stairs-Rails Left   Home Layout Two level;Able to live on main level with bedroom/bathroom     Prior Function   Level of Independence Independent   Vocation Retired     IT consultant   Overall Cognitive Status Within Functional Limits for tasks assessed     Observation/Other Assessments   Focus on Therapeutic Outcomes (FOTO)  61% limited urinary problem survey     Posture/Postural Control   Posture/Postural Control Postural limitations   Postural Limitations Rounded Shoulders;Forward head;Increased thoracic kyphosis     ROM / Strength   AROM / PROM / Strength Strength     Strength   Overall Strength Comments bil. knees 5/5;     Right Hip Flexion 4/5   Right Hip ABduction 4+/5   Left Hip Flexion 4+/5   Left Hip ABduction 4+/5                  Pelvic Floor Special Questions - 06/26/17 0001    Urinary Leakage Yes   Pad use 2/day   Activities that cause leaking Coughing;Sneezing;Laughing  when not taking the medicine   Urinary urgency --  can make it to the door now   Pelvic Floor Internal Exam pt and daughter informed and pt gave consent to perform internal assessment of pelvic floor   Exam Type Vaginal   Strength fair squeeze, definite lift           [facility] Adult PT Treatment/Exercise - 06/26/17 1240      Neuro Re-ed    Neuro Re-ed Details  sitting on a greenball- alternate shoulder flexion with pelvic floor contraction, lift heels up with tightening the pelvic floor; ; Seated ball squeeze with pelvic floor contraction     Lumbar Exercises: Seated   Long Arc Quad on Chair Strengthening;Right;Left;Weights;2 sets;10 reps   LAQ on Chair Weights (lbs) 3   LAQ on Chair Limitations with pelvic floor contraction and verbal cues to go slow   Sit to Stand Limitations from mat 10 times with keeping feet on the floor                PT Education - 06/26/17 1004    Education provided Yes   Education Details PT discussed outcome measure results and d/c next session. PT educated pt on informing MD about ceasing medication.    Person(s) Educated Patient;Child(ren)   Methods Explanation   Comprehension Verbalized understanding          PT Short Term Goals - 06/26/17 1320      PT SHORT TERM GOAL #7   Title able to perform pelvic floor contraction 3 sec hold for 10 reps due to improved muscle endurance and ability to relax pelvic floor muscles fully between reps for improved muscle function to control bladder   Time 4   Period Weeks   Status Achieved     PT SHORT TERM GOAL #8   Title able to use urge to void techniques correctly for report of greater bladder control and 25% reduction in  urinary frequency   Time 4   Period Weeks   Status Achieved           PT Long Term Goals - 06/26/17 1321      PT LONG TERM GOAL #7   Title FOTO< or = to 44%limited urinary problem survey   Time 12   Period Weeks  Status On-going     PT LONG TERM GOAL #8   Title reduced nocturia to 2x/night   Time 12   Period Weeks   Status Achieved     PT LONG TERM GOAL  #9   TITLE reduced pad use to 2/day   Time 12   Period Weeks   Status Achieved     PT LONG TERM GOAL  #10   TITLE redued urinary frequency to void every 3 hours for less frequent trips to the bathroom and able to wait until at least 8 seconds of full stream of urine is eliminated   Time 12   Period Weeks   Status Achieved     PT LONG TERM GOAL  #11   TITLE ability to walk to the commode without leaking urine due to increased strength and endurance   Time 8   Period Weeks   Status New               Plan - 06/26/17 1316    Clinical Impression Statement Pateint has increased strength in pelvic floor to 3/5 compared to 2/5.  Patient has not urinary leakage with coughing and sneezing unless she is not taking her medication.  Patient is down to 2 pads instead of 4 pads.  Patient reports her urinary leakage is 50% better.  Patient has increased strength in bilateral lower extremities. Patient is able to get to the doorway before she will leak urine.  Patient will benefit from skilled therapy to reduce urinary leakage and improve overall strength.    Rehab Potential Good   Clinical Impairments Affecting Rehab Potential pain of chronic nature   PT Frequency 1x / week   PT Duration 8 weeks   PT Treatment/Interventions ADLs/Self Care Home Management;Canalith Repostioning;Electrical Stimulation;DME Instruction;Gait training;Stair training;Functional mobility training;Therapeutic activities;Therapeutic exercise;Balance training;Neuromuscular re-education;Cognitive remediation;Patient/family education;Orthotic  Fit/Training;Energy conservation;Taping;Vestibular;Biofeedback;Cryotherapy;Moist Heat;Ultrasound;Manual techniques   PT Next Visit Plan continue physical therapy for urinary leakage to improve strength in different positions and walking   PT Home Exercise Plan progress as needed   Consulted and Agree with Plan of Care Patient;Family member/caregiver;Other (Comment)  interpretor   Family Member Consulted daughter      Patient will benefit from skilled therapeutic intervention in order to improve the following deficits and impairments:     Visit Diagnosis: Other lack of coordination - Plan: PT plan of care cert/re-cert  Muscle weakness (generalized) - Plan: PT plan of care cert/re-cert     Problem List Patient Active Problem List   Diagnosis Date Noted  . Primary osteoarthritis of left knee 02/09/2017  . SDH (subdural hematoma) (HCC)   . Urinary frequency   . Type 2 diabetes mellitus with peripheral neuropathy (HCC)   . Cough   . Gait disturbance, post-stroke 10/20/2016  . Hemiparesis affecting left side as late effect of stroke (HCC) 10/20/2016  . Essential hypertension 10/19/2016  . Hyperlipidemia LDL goal <70 10/19/2016  . 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  . Left hemiparesis (HCC)   . Atrial fibrillation (HCC)   . History of fall   . History of traumatic subdural hematoma   . Vascular headache   . Hypokalemia   . Leukocytosis   . Acute blood loss anemia   . Hypoalbuminemia due to protein-calorie malnutrition (HCC)   . Dysphagia, post-stroke   . Acute respiratory failure (HCC)   . Acute embolic stroke (HCC) - R putamen/caudate and R insular infarcts s/p TICI3 revascularization w/ mechanical thrombectomy d/t AF not on [facility] 10/15/2016  .  Pressure injury of skin 10/15/2016  . HCAP (healthcare-associated pneumonia) 11/09/2014  . Weakness 08/25/2014  . Acute bronchitis 08/25/2014  . UTI (lower urinary tract infection) 08/25/2014  . Fracture of lumbar spine (HCC)  08/25/2014  . Subdural hematoma, post-traumatic (HCC) 05/12/2014  . Chronic atrial fibrillation (HCC) 05/12/2014  . Diabetes mellitus (HCC) 05/12/2014  . OSA on CPAP 05/12/2014    Eulis Foster, PT 06/26/17 1:25 PM   [facility] [address], [address] [place], [place], [address] Phone: [phone]   Fax:  [phone]  Name: [name] MRN: [id number] Date of Birth: September 02, 1941

## 2017-06-26 NOTE — Therapy (Signed)
[facility] [address] [place], [place], [address] Phone: [phone]   Fax:  [phone]  Physical Therapy Treatment  Patient Details  Name: [name] MRN: [id number] Date of Birth: 05/01/1941 Referring Provider: Alysia Penna, MD  Encounter Date: 06/26/2017      PT End of Session - 06/26/17 1014    Visit Number 37   Number of Visits 41   Date for PT Re-Evaluation 06/30/17   Authorization Type Medicare & G-codes with proress note every 10th visit    Authorization Time Period 3/32 end of certification for pelvic floor PT; 9/51 end of certification for neuro PT   PT Start Time 0932   PT Stop Time 1013   PT Time Calculation (min) 41 min   Equipment Utilized During Treatment Gait belt   Activity Tolerance Patient tolerated treatment well   Behavior During Therapy [facility] for tasks assessed/performed      Past Medical History:  Diagnosis Date  . Atrial fibrillation ([place])   . Chronic back pain    "mid back down into lower back" (08/25/2014)  . Frequent falls   . GERD (gastroesophageal reflux disease)   . Hypertriglyceridemia   . OSA on CPAP   . Osteoarthritis    "knees, hands, back" (08/25/2014)  . Pneumonia ~ 2000 X 1  . Subdural hematoma ([place]) july 2015   S/P fall while on Coumadin  . T12 compression fracture ([place]) 2012  . Type II diabetes mellitus ([place])     Past Surgical History:  Procedure Laterality Date  . APPENDECTOMY  2012  . CATARACT EXTRACTION W/ INTRAOCULAR LENS  IMPLANT, BILATERAL Bilateral 2000's  . IR GENERIC HISTORICAL  10/15/2016   IR PERCUTANEOUS ART THROMBECTOMY/INFUSION INTRACRANIAL INC DIAG ANGIO 10/15/2016 [name], MD [facility]  . IR GENERIC HISTORICAL  12/13/2016   IR RADIOLOGIST EVAL & MGMT 12/13/2016 [facility]  . RADIOLOGY WITH ANESTHESIA N/A 10/15/2016   Procedure: RADIOLOGY WITH ANESTHESIA;  Surgeon: [name], MD;  Location: [place];  Service: Radiology;   Laterality: N/A;  . TOTAL ABDOMINAL HYSTERECTOMY  1990    There were no vitals filed for this visit.      Subjective Assessment - 06/26/17 0935    Subjective Pt denied falls or changes since last. Pt ceased medication for incontinence last night, as she wants to see if pelvic health is assisting pt's progress. PT encouraged pt to inform MD.    Patient is accompained by: Family member   Pertinent History falls with subdural hematoma, Afib, chronic back pain, T12 compression fx, type 2 DM, OA, PNA   Patient Stated Goals to treat her weak legs and walk without AD; reduce urinary leakage   Currently in Pain? No/denies                         [facility] Adult PT Treatment/Exercise - 06/26/17 0937      Ambulation/Gait   Ambulation/Gait Yes   Ambulation/Gait Assistance 5: Supervision   Ambulation/Gait Assistance Details Cues to improve L heel strike and to control momentum while traversing declines.    Ambulation Distance (Feet) 500 Feet  outdoors and 200', 75'  indoors   Assistive device Rollator   Gait Pattern Step-through pattern;Decreased stride length;Decreased step length - left;Poor foot clearance - left   Ambulation Surface Level;Unlevel;Indoor;Outdoor;Paved   Ramp 5: Supervision   Ramp Details (indicate cue type and reason) Cues to improve safety and stay close to rollator, use brakes  for safety.   Curb 4: Min assist   Curb Details (indicate cue type and reason) Cues for safety and placement with rollator.      Standardized Balance Assessment   Standardized Balance Assessment Berg Balance Test;Timed Up and Go Test     Berg Balance Test   Sit to Stand Able to stand without using hands and stabilize independently   Standing Unsupported Able to stand safely 2 minutes   Sitting with Back Unsupported but Feet Supported on Floor or Stool Able to sit safely and securely 2 minutes   Stand to Sit Sits safely with minimal use of hands   Transfers Able to transfer safely,  minor use of hands   Standing Unsupported with Eyes Closed Able to stand 10 seconds safely   Standing Ubsupported with Feet Together Able to place feet together independently and stand 1 minute safely   From Standing, Reach Forward with Outstretched Arm Can reach confidently >25 cm (10")   From Standing Position, Pick up Object from Floor Able to pick up shoe safely and easily   From Standing Position, Turn to Look Behind Over each Shoulder Looks behind from both sides and weight shifts well   Turn 360 Degrees Able to turn 360 degrees safely but slowly   Standing Unsupported, Alternately Place Feet on Step/Stool Able to stand independently and safely and complete 8 steps in 20 seconds   Standing Unsupported, One Foot in Front Able to plae foot ahead of the other independently and hold 30 seconds   Standing on One Leg Able to lift leg independently and hold > 10 seconds   Total Score 53     Timed Up and Go Test   TUG Normal TUG   Normal TUG (seconds) 34.34  with rollator and 21.12 sec. without rollator                PT Education - 06/26/17 1004    Education provided Yes   Education Details PT discussed outcome measure results and d/c next session. PT educated pt on informing MD about ceasing medication.    Person(s) Educated Patient;Child(ren)   Methods Explanation   Comprehension Verbalized understanding          PT Short Term Goals - 06/23/17 1804      PT SHORT TERM GOAL #1   Title = LTG with 4 week recertification     PT SHORT TERM GOAL #8   Baseline 3 x during the day, at night   Time 4           PT Long Term Goals - 06/26/17 1016      PT LONG TERM GOAL #1   Title Patient will demonstrate independence with HEP and verbalize options for community fitness program.    Time 4   Period Weeks   Status Revised     PT LONG TERM GOAL #3   Title Patient will score > or = 52/56 on the Berg Balance Test to indicate decreased falls risk.    Baseline 50/56    Time  4   Period Weeks   Status Achieved     PT LONG TERM GOAL #4   Title Pt will decrease falls risk with sit <> stand and turning as indicated by TUG score of <20 with rollator   Baseline 29 seconds with rollator/<29 seconds with quad cane   Time 4   Period Weeks   Status Not Met     PT LONG TERM GOAL #5  Title Patient will ambulate 500 feet outside over uneven terrain including ramps/curbs with rollator safely with supervision with improved foot clearance and upright posture.    Time 4   Period Weeks   Status Partially Met               Plan - 06/26/17 1014    Clinical Impression Statement Pt met LTG 3 and partially met LTG 5, pt did not meet LTG 4. PT will finish assessing goals next session and d/c pt. Pt's BERG score indicates pt is at low risk for falls but TUG time indicates pt is at risk for falls. Continue with POC.    Rehab Potential Good   Clinical Impairments Affecting Rehab Potential pain of chronic nature   PT Frequency 3x / week   PT Duration 4 weeks   PT Treatment/Interventions ADLs/Self Care Home Management;Canalith Repostioning;Electrical Stimulation;DME Instruction;Gait training;Stair training;Functional mobility training;Therapeutic activities;Therapeutic exercise;Balance training;Neuromuscular re-education;Cognitive remediation;Patient/family education;Orthotic Fit/Training;Energy conservation;Taping;Vestibular;Biofeedback;Cryotherapy;Moist Heat;Ultrasound;Manual techniques   PT Next Visit Plan G-CODE. Check LTG 1(HEP) and d/c.    PT Home Exercise Plan progress as needed   Consulted and Agree with Plan of Care Patient;Family member/caregiver;Other (Comment)   Family Member Consulted daughter      Patient will benefit from skilled therapeutic intervention in order to improve the following deficits and impairments:  Abnormal gait, Decreased endurance, Decreased balance, Decreased cognition, Decreased mobility, Decreased strength, Difficulty walking, Dizziness,  Impaired sensation, Pain, Postural dysfunction, Increased muscle spasms, Decreased safety awareness  Visit Diagnosis: Other abnormalities of gait and mobility  Muscle weakness (generalized)  Other lack of coordination     Problem List Patient Active Problem List   Diagnosis Date Noted  . Primary osteoarthritis of left knee 02/09/2017  . SDH (subdural hematoma) ([place])   . Urinary frequency   . Type 2 diabetes mellitus with peripheral neuropathy (HCC)   . Cough   . Gait disturbance, post-stroke 10/20/2016  . Hemiparesis affecting left side as late effect of stroke ([place]) 10/20/2016  . Essential hypertension 10/19/2016  . Hyperlipidemia LDL goal <70 10/19/2016  . 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[place]) 92/42/6834  . Left hemiparesis ([place])   . Atrial fibrillation ([place])   . History of fall   . History of traumatic subdural hematoma   . Vascular headache   . Hypokalemia   . Leukocytosis   . Acute blood loss anemia   . Hypoalbuminemia due to protein-calorie malnutrition ([place])   . Dysphagia, post-stroke   . Acute respiratory failure ([place])   . Acute embolic stroke (HCC) - R putamen/caudate and R insular infarcts s/p TICI3 revascularization w/ mechanical thrombectomy d/t AF not on [facility] 10/15/2016  . Pressure injury of skin 10/15/2016  . HCAP (healthcare-associated pneumonia) 11/09/2014  . Weakness 08/25/2014  . Acute bronchitis 08/25/2014  . UTI (lower urinary tract infection) 08/25/2014  . Fracture of lumbar spine ([place]) 08/25/2014  . Subdural hematoma, post-traumatic ([place]) 05/12/2014  . Chronic atrial fibrillation ([place]) 05/12/2014  . Diabetes mellitus ([place]) 05/12/2014  . OSA on CPAP 05/12/2014    Znya Albino L 06/26/2017, 10:16 AM  [facility] [address] [place], [place], [address] Phone: [phone]   Fax:  [phone]  Name: [name] MRN: [id number] Date of Birth: 01/18/1941  Geoffry Paradise, PT,DPT 06/26/17 10:17  AM Phone: [phone] Fax: [phone]

## 2017-06-27 ENCOUNTER — Ambulatory Visit: Payer: Medicare Other

## 2017-06-28 ENCOUNTER — Encounter: Payer: Self-pay | Admitting: Physical Therapy

## 2017-06-28 ENCOUNTER — Ambulatory Visit: Payer: Medicare Other | Admitting: Physical Therapy

## 2017-06-28 DIAGNOSIS — R2681 Unsteadiness on feet: Secondary | ICD-10-CM

## 2017-06-28 DIAGNOSIS — M6281 Muscle weakness (generalized): Secondary | ICD-10-CM

## 2017-06-28 DIAGNOSIS — I69354 Hemiplegia and hemiparesis following cerebral infarction affecting left non-dominant side: Secondary | ICD-10-CM

## 2017-06-28 DIAGNOSIS — R296 Repeated falls: Secondary | ICD-10-CM

## 2017-06-28 DIAGNOSIS — R2689 Other abnormalities of gait and mobility: Secondary | ICD-10-CM

## 2017-06-28 NOTE — Patient Instructions (Signed)
Functional Quadriceps: Sit to Stand    Sit on edge of chair (use a high chair or build up seat surface with pillows), feet flat on floor. Stand upright, extending knees fully, do not use hands.  To make it harder: sit in lower chair OR increase to 12 repetitions OR open arms up wide when standing up to strengthen upper back  Repeat 10-12 times per set. Do _1-2_ sessions per day.   Hip Side Kick    Holding a chair for balance, keep legs shoulder width apart and toes pointed forward. Bring a leg out to side, keeping knee straight. Do not lean, lead with the heel-GO slow. Repeat using other leg, alternating legs. Repeat _10_ times each leg. Do __1-2__ sessions per day.   Hip Extension (Standing)    Stand with support. Move one leg backward with straight knee and then back to start position. Repeat with other leg. Repeat _10_ times each leg. Do 1-2 times a day.   "I love a Licensed conveyancerarade" Lift   At counter for balance as needed: high knee marching, alternate legs, 10 repetitions. 3 second pauses with each knee lift-keep your back up straight.. Do _1-2_ sessions per day.   Feet Apart (Compliant Surface) Head Motion - Eyes Open    With eyes open, standing on solid floor or compliant surface (pillow) if daughter is present, hold the back of a chair: feet shoulder width apart, move head slowly: side to side 10 times; then move arms up and down 10 times Repeat 2 times per session. Do 2 sessions per day.  Up / Down Head Motion    Perform at kitchen counter with one hand holding counter for safety. Walking on solid surface, move head and eyes toward ceiling for __2__ steps.  Then, move head and eyes toward floor for __2__ steps. Repeat __4__ times per session. Do __1__ sessions per day.    Side to Side Head Motion    Perform at kitchen counter with one hand holding counter for safety. Walking on solid surface, turn head and eyes to left for __2__ steps.  Then, turn head and  eyes to opposite side for __2__ steps. Repeat sequence __4__ times per session. Do __1__ sessions per day.  Feet Heel-Toe "Tandem"    At counter: Arms at sides, walk a straight line forward bringing one foot directly in front of the other, and then a straight line backwards bringing one foot directly behind the other one.  Repeat for _3 laps each way. Do _1-2_ sessions per day

## 2017-06-28 NOTE — Therapy (Addendum)
[facility] [address] [place], [place], [address] Phone: [phone]   Fax:  [phone]  Physical Therapy Treatment and D/C Summary  Patient Details  Name: [name] MRN: [id number] Date of Birth: 08/02/1941 Referring Provider: Charlett Blake, MD  Encounter Date: 06/28/2017      PT End of Session - 06/28/17 1628    Visit Number 39   Number of Visits 41   Date for PT Re-Evaluation 06/30/17  neuro PT D/C on 06/28/17   Authorization Type Medicare & G-codes with proress note every Dec 29, 2022 visit    Authorization Time Period 6/96 end of certification for pelvic floor PT; 2/95 end of certification for neuro PT   PT Start Time 1535   PT Stop Time 1620   PT Time Calculation (min) 45 min   Activity Tolerance Patient tolerated treatment well   Behavior During Therapy [facility] for tasks assessed/performed      Past Medical History:  Diagnosis Date  . Atrial fibrillation ([place])   . Chronic back pain    "mid back down into lower back" (08/25/2014)  . Frequent falls   . GERD (gastroesophageal reflux disease)   . Hypertriglyceridemia   . OSA on CPAP   . Osteoarthritis    "knees, hands, back" (08/25/2014)  . Pneumonia ~ 2000 X 1  . Subdural hematoma ([place]) july 2015   S/P fall while on Coumadin  . T12 compression fracture ([place]) 2012  . Type II diabetes mellitus ([place])     Past Surgical History:  Procedure Laterality Date  . APPENDECTOMY  2012  . CATARACT EXTRACTION W/ INTRAOCULAR LENS  IMPLANT, BILATERAL Bilateral 2000's  . IR GENERIC HISTORICAL  10/15/2016   IR PERCUTANEOUS ART THROMBECTOMY/INFUSION INTRACRANIAL INC DIAG ANGIO 10/15/2016 [name], MD [facility]-INTERV RAD  . IR GENERIC HISTORICAL  12/13/2016   IR RADIOLOGIST EVAL & MGMT 12/13/2016 [facility]  . RADIOLOGY WITH ANESTHESIA N/A 10/15/2016   Procedure: RADIOLOGY WITH ANESTHESIA;  Surgeon: [name], MD;  Location: [facility];  Service: Radiology;   Laterality: N/A;  . TOTAL ABDOMINAL HYSTERECTOMY  1990    There were no vitals filed for this visit.      Subjective Assessment - 06/28/17 1538    Subjective Pt doing well at home, no falls and no issues.  Will continue to work with pelvic floor PT   Patient is accompained by: Family member;Interpreter   Pertinent History falls with subdural hematoma, Afib, chronic back pain, T12 compression fx, type 2 DM, OA, PNA   Limitations Other (comment)   Patient Stated Goals to treat her weak legs and walk without AD; reduce urinary leakage   Currently in Pain? No/denies      Functional Quadriceps: Sit to Stand    Sit on edge of chair (use a high chair or build up seat surface with pillows), feet flat on floor. Stand upright, extending knees fully, do not use hands.  To make it harder: sit in lower chair OR increase to 12 repetitions OR open arms up wide when standing up to strengthen upper back  Repeat 10-12 times per set. Do _1-2_ sessions per day.   Hip Side Kick    Holding a chair for balance, keep legs shoulder width apart and toes pointed forward. Bring a leg out to side, keeping knee straight. Do not lean, lead with the heel-GO slow. Repeat using other leg, alternating legs. Repeat _10_ times each leg. Do __1-2__ sessions per day.   Hip Extension (  Standing)    Stand with support. Move one leg backward with straight knee and then back to start position. Repeat with other leg. Repeat _10_ times each leg. Do 1-2 times a day.   "I love a Database administrator   At counter for balance as needed: high knee marching, alternate legs, 10 repetitions. 3 second pauses with each knee lift-keep your back up straight.. Do _1-2_ sessions per day.   Feet Apart (Compliant Surface) Head Motion - Eyes Open    With eyes open, standing on solid floor or compliant surface (pillow) if daughter is present, hold the back of a chair: feet shoulder width apart, move head slowly: side to side  10 times; then move arms up and down 10 times Repeat 2 times per session. Do 2 sessions per day.  Up / Down Head Motion    Perform at kitchen counter with one hand holding counter for safety. Walking on solid surface, move head and eyes toward ceiling for __2__ steps.  Then, move head and eyes toward floor for __2__ steps. Repeat __4__ times per session. Do __1__ sessions per day.    Side to Side Head Motion    Perform at kitchen counter with one hand holding counter for safety. Walking on solid surface, turn head and eyes to left for __2__ steps.  Then, turn head and eyes to opposite side for __2__ steps. Repeat sequence __4__ times per session. Do __1__ sessions per day.  Feet Heel-Toe "Tandem"    At counter: Arms at sides, walk a straight line forward bringing one foot directly in front of the other, and then a straight line backwards bringing one foot directly behind the other one.  Repeat for _3 laps each way. Do _1-2_ sessions per day          PT Education - 06/28/17 1628    Education provided Yes   Education Details updated HEP   Person(s) Educated Patient;Child(ren)   Methods Explanation;Demonstration;Handout   Comprehension Verbalized understanding;Returned demonstration          PT Short Term Goals - 06/26/17 1320      PT SHORT TERM GOAL #7   Title able to perform pelvic floor contraction 3 sec hold for 10 reps due to improved muscle endurance and ability to relax pelvic floor muscles fully between reps for improved muscle function to control bladder   Time 4   Period Weeks   Status Achieved     PT SHORT TERM GOAL #8   Title able to use urge to void techniques correctly for report of greater bladder control and 25% reduction in urinary frequency   Time 4   Period Weeks   Status Achieved           PT Long Term Goals - 06/28/17 1629      PT LONG TERM GOAL #1   Title Patient will demonstrate independence with HEP and verbalize options for  community fitness program.    Time 4   Period Weeks   Status Achieved     PT LONG TERM GOAL #3   Title Patient will score > or = 52/56 on the Berg Balance Test to indicate decreased falls risk.    Baseline 53/56   Time 4   Period Weeks   Status Achieved     PT LONG TERM GOAL #4   Title Pt will decrease falls risk with sit <> stand and turning as indicated by TUG score of <20 with rollator   Baseline  34 sec with rollator; 21.12 without AD   Time 4   Period Weeks   Status Partially Met     PT LONG TERM GOAL #5   Title Patient will ambulate 500 feet outside over uneven terrain including ramps/curbs with rollator safely with supervision with improved foot clearance and upright posture.    Baseline min A on curb   Time 4   Period Weeks   Status Partially Met               Plan - 07-08-2017 1631    Clinical Impression Statement Continued re-assessment of LTG with review of current HEP; exercises continue to provide sufficient challenge except upgraded difficulty of sit <> stand and added tandem gait back to HEP.  Discussed with pt importance of performing 1-2x/day for 3-5x/week with supervision of daughter.  Pt has made good progress and will continue to work with pelvic floor PT.  Pt safe for D/C from Neuro PT.   Rehab Potential Good   Clinical Impairments Affecting Rehab Potential pain of chronic nature   PT Frequency 3x / week   PT Duration 4 weeks   PT Treatment/Interventions ADLs/Self Care Home Management;Canalith Repostioning;Electrical Stimulation;DME Instruction;Gait training;Stair training;Functional mobility training;Therapeutic activities;Therapeutic exercise;Balance training;Neuromuscular re-education;Cognitive remediation;Patient/family education;Orthotic Fit/Training;Energy conservation;Taping;Vestibular;Biofeedback;Cryotherapy;Moist Heat;Ultrasound;Manual techniques   Consulted and Agree with Plan of Care Patient;Family member/caregiver;Other (Comment)   Family  Member Consulted daughter      Patient will benefit from skilled therapeutic intervention in order to improve the following deficits and impairments:  Abnormal gait, Decreased endurance, Decreased balance, Decreased cognition, Decreased mobility, Decreased strength, Difficulty walking, Dizziness, Impaired sensation, Pain, Postural dysfunction, Increased muscle spasms, Decreased safety awareness  Visit Diagnosis: Muscle weakness (generalized)  Unsteadiness on feet  Other abnormalities of gait and mobility  Hemiplegia and hemiparesis following cerebral infarction affecting left non-dominant side (HCC)  Repeated falls       G-Codes - 07-08-2017 1634    Functional Assessment Tool Used (Outpatient Only) BERG 53/56, TUG 34 sec with RW/21.12 without RW   Functional Limitation Mobility: Walking and moving around   Mobility: Walking and Moving Around Goal Status [phone]) At least 1 percent but less than 20 percent impaired, limited or restricted   Mobility: Walking and Moving Around Discharge Status [phone]) At least 1 percent but less than 20 percent impaired, limited or restricted      Problem List Patient Active Problem List   Diagnosis Date Noted  . Primary osteoarthritis of left knee 02/09/2017  . SDH (subdural hematoma) ([place])   . Urinary frequency   . Type 2 diabetes mellitus with peripheral neuropathy (HCC)   . Cough   . Gait disturbance, post-stroke 10/20/2016  . Hemiparesis affecting left side as late effect of stroke ([place]) 10/20/2016  . Essential hypertension 10/19/2016  . Hyperlipidemia LDL goal <70 10/19/2016  . 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 Left hemiparesis ([place])   . Atrial fibrillation ([place])   . History of fall   . History of traumatic subdural hematoma   . Vascular headache   . Hypokalemia   . Leukocytosis   . Acute blood loss anemia   . Hypoalbuminemia due to protein-calorie malnutrition ([place])   . Dysphagia, post-stroke   . Acute respiratory failure  ([place])   . Acute embolic stroke (HCC) - R putamen/caudate and R insular infarcts s/p TICI3 revascularization w/ mechanical thrombectomy d/t AF not on [facility] 10/15/2016  . Pressure injury of skin 10/15/2016  . HCAP (healthcare-associated pneumonia) 11/09/2014  . Weakness 08/25/2014  .  Acute bronchitis 08/25/2014  . UTI (lower urinary tract infection) 08/25/2014  . Fracture of lumbar spine ([place]) 08/25/2014  . Subdural hematoma, post-traumatic ([place]) 05/12/2014  . Chronic atrial fibrillation ([place]) 05/12/2014  . Diabetes mellitus ([place]) 05/12/2014  . OSA on CPAP 05/12/2014   PHYSICAL THERAPY DISCHARGE SUMMARY  Visits from Start of Care: 39  Current functional level related to goals / functional outcomes: See LTG; pt has demonstrated significant improvement in gait velocity, safety with rollator and standing balance.     Remaining deficits: Impaired LE strength, trunk and postural control, impaired balance, impaired gait and endurance   Education / Equipment: HEP  Plan: Patient agrees to discharge.  Patient goals were partially met. Patient is being discharged due to being pleased with the current functional level.  ?????    Raylene Everts, PT, DPT 06/28/17    4:42 PM    [facility] [address] [place], [place], [address] Phone: [phone]   Fax:  [phone]  Name: Jailynn Lavalais MRN: [id number] Date of Birth: 21-Jan-1941

## 2017-06-30 ENCOUNTER — Ambulatory Visit: Payer: Medicare Other | Admitting: Physical Therapy

## 2017-07-03 ENCOUNTER — Ambulatory Visit: Payer: Medicare Other | Admitting: Physical Therapy

## 2017-07-05 ENCOUNTER — Ambulatory Visit: Payer: Medicare Other | Admitting: Physical Therapy

## 2017-07-05 DIAGNOSIS — M6281 Muscle weakness (generalized): Secondary | ICD-10-CM

## 2017-07-05 DIAGNOSIS — R278 Other lack of coordination: Secondary | ICD-10-CM

## 2017-07-05 NOTE — Therapy (Addendum)
[facility] [address] [place], [place], [address] Phone: [phone]   Fax:  [phone]  Physical Therapy Treatment  Patient Details  Name: [name] MRN: [id number] Date of Birth: 1941-01-11 Referring Provider: Dr. Gaynelle Arabian III  Encounter Date: 07/05/2017      PT End of Session - 07/05/17 1447    Visit Number 40   Number of Visits 41   Date for PT Re-Evaluation 08/25/17   Authorization Type Medicare & G-codes with proress note every 10th visit    PT Start Time 1447   PT Stop Time 1530   PT Time Calculation (min) 43 min   Activity Tolerance Patient tolerated treatment well   Behavior During Therapy [facility] for tasks assessed/performed      Past Medical History:  Diagnosis Date  . Atrial fibrillation (HCC)   . Chronic back pain    "mid back down into lower back" (08/25/2014)  . Frequent falls   . GERD (gastroesophageal reflux disease)   . Hypertriglyceridemia   . OSA on CPAP   . Osteoarthritis    "knees, hands, back" (08/25/2014)  . Pneumonia ~ 2000 X 1  . Subdural hematoma (HCC) july 2015   S/P fall while on Coumadin  . T12 compression fracture (HCC) 2012  . Type II diabetes mellitus (HCC)     Past Surgical History:  Procedure Laterality Date  . APPENDECTOMY  2012  . CATARACT EXTRACTION W/ INTRAOCULAR LENS  IMPLANT, BILATERAL Bilateral 2000's  . IR GENERIC HISTORICAL  10/15/2016   IR PERCUTANEOUS ART THROMBECTOMY/INFUSION INTRACRANIAL INC DIAG ANGIO 10/15/2016 [name], MD [facility]INTER[facility] RAD  . IR GENERIC HISTORICAL  12/13/2016   IR RADIOLOGIST EVAL & MGMT 12/13/2016 [facility]  . RADIOLOGY WITH ANESTHESIA N/A 10/15/2016   Procedure: RADIOLOGY WITH ANESTHESIA;  Surgeon: [name], MD;  [facility];  Service: Radiology;  Laterality: N/A;  . TOTAL ABDOMINAL HYSTERECTOMY  1990    There were no vitals filed for this visit.      Subjective Assessment - 07/05/17 1450     Subjective Patient states no more leakage  with medication.  Most of the time urinary frequency is not as much at night.  Average 3x/night   Patient is accompained by: Family member;Interpreter   Pertinent History falls with subdural hematoma, Afib, chronic back pain, T12 compression fx, type 2 DM, OA, PNA   Patient Stated Goals to treat her weak legs and walk without AD; reduce urinary leakage   Currently in Pain? No/denies                         [facility] Adult PT Treatment/Exercise - 07/05/17 0001      Therapeutic Activites    Therapeutic Activities ADL's  walking one lap with focus on posture     Neuro Re-ed    Neuro Re-ed Details  sitting on green ball, bouncing and rocking side to side, PF contract and relax     Lumbar Exercises: Standing   Other Standing Lumbar Exercises shoulder ER red band, row red band - 15x  cues for posture and core engaged   Other Standing Lumbar Exercises hip flexion and abduction with PF contract and core engaged     Lumbar Exercises: Seated   Hip Flexion on Ball Strengthening;Right;Left;10 reps  3 sec hold, UE x 1   Hip Flexion on Ball Limitations sitting on ball horizontal abd sholder with yellow band 10x  PT Short Term Goals - 06/26/17 1320      PT SHORT TERM GOAL #7   Title able to perform pelvic floor contraction 3 sec hold for 10 reps due to improved muscle endurance and ability to relax pelvic floor muscles fully between reps for improved muscle function to control bladder   Time 4   Period Weeks   Status Achieved     PT SHORT TERM GOAL #8   Title able to use urge to void techniques correctly for report of greater bladder control and 25% reduction in urinary frequency   Time 4   Period Weeks   Status Achieved           PT Long Term Goals - 07/05/17 1454      PT LONG TERM GOAL #7   Title FOTO< or = to 44%limited urinary problem survey   Time 12   Period Weeks   Status On-going     PT  LONG TERM GOAL  #10   TITLE redued urinary frequency to void every 3 hours for less frequent trips to the bathroom and able to wait until at least 8 seconds of full stream of urine is eliminated   Baseline frequency is good during day, but volume is not there   Time 12   Period Weeks   Status Achieved     PT LONG TERM GOAL  #11   TITLE ability to walk to the commode without leaking urine due to increased strength and endurance   Time 8   Period Weeks   Status On-going               Plan - 07/05/17 1713    Clinical Impression Statement Patient walking with large based quad cane today with improved upright posture.  Pt is having no leakage now on medication.  She was able to progress exercises today with increased standing and able to tolerate standing and walking without sitting to take a break.  Pt continues to need a lot of cues for breathing and has difficulty taking deep breaths.  Improved some with cues for slower breathing.  Pt will benefit from skilled PT to continue working on strength and coordination of pelvic floor with core.   Rehab Potential Good   Clinical Impairments Affecting Rehab Potential pain of chronic nature   PT Treatment/Interventions ADLs/Self Care Home Management;Canalith Repostioning;Electrical Stimulation;DME Instruction;Gait training;Stair training;Functional mobility training;Therapeutic activities;Therapeutic exercise;Balance training;Neuromuscular re-education;Cognitive remediation;Patient/family education;Orthotic Fit/Training;Energy conservation;Taping;Vestibular;Biofeedback;Cryotherapy;Moist Heat;Ultrasound;Manual techniques   PT Next Visit Plan continue physical therapy for urinary leakage to improve strength in different positions and walking   PT Home Exercise Plan progress as needed   Consulted and Agree with Plan of Care Patient;Family member/caregiver;Other (Comment)   Family Member Consulted daughter      Patient will benefit from skilled  therapeutic intervention in order to improve the following deficits and impairments:  Abnormal gait, Decreased endurance, Decreased balance, Decreased cognition, Decreased mobility, Decreased strength, Difficulty walking, Dizziness, Impaired sensation, Pain, Postural dysfunction, Increased muscle spasms, Decreased safety awareness  Visit Diagnosis: Muscle weakness (generalized)  Other lack of coordination  Gcodes Self care: clinical assessment Current CL Goal CK   Problem List Patient Active Problem List   Diagnosis Date Noted  . Primary osteoarthritis of left knee 02/09/2017  . SDH (subdural hematoma) (HCC)   . Urinary frequency   . Type 2 diabetes mellitus with peripheral neuropathy (HCC)   . Cough   . Gait disturbance, post-stroke 10/20/2016  . Hemiparesis affecting left  side as late effect of stroke (HCC) 10/20/2016  . Essential hypertension 10/19/2016  . Hyperlipidemia LDL goal <70 10/19/2016  . 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  . Left hemiparesis (HCC)   . Atrial fibrillation (HCC)   . History of fall   . History of traumatic subdural hematoma   . Vascular headache   . Hypokalemia   . Leukocytosis   . Acute blood loss anemia   . Hypoalbuminemia due to protein-calorie malnutrition (HCC)   . Dysphagia, post-stroke   . Acute respiratory failure (HCC)   . Acute embolic stroke (HCC) - R putamen/caudate and R insular infarcts s/p TICI3 revascularization w/ mechanical thrombectomy d/t AF not on [facility] 10/15/2016  . Pressure injury of skin 10/15/2016  . HCAP (healthcare-associated pneumonia) 11/09/2014  . Weakness 08/25/2014  . Acute bronchitis 08/25/2014  . UTI (lower urinary tract infection) 08/25/2014  . Fracture of lumbar spine (HCC) 08/25/2014  . Subdural hematoma, post-traumatic (HCC) 05/12/2014  . Chronic atrial fibrillation (HCC) 05/12/2014  . Diabetes mellitus (HCC) 05/12/2014  . OSA on CPAP 05/12/2014    Vincente Poli, PT 07/05/2017, 5:20 PM  [facility] [address] [place], [place], [address] Phone: [phone]   Fax:  [phone]  Name: [name] MRN: [id number] Date of Birth: [birth year]

## 2017-07-07 ENCOUNTER — Ambulatory Visit: Payer: Medicare Other | Admitting: Physical Medicine & Rehabilitation

## 2017-07-10 ENCOUNTER — Encounter: Payer: Medicare Other | Attending: Physical Medicine & Rehabilitation

## 2017-07-10 ENCOUNTER — Encounter: Payer: Self-pay | Admitting: Physical Medicine & Rehabilitation

## 2017-07-10 ENCOUNTER — Ambulatory Visit (HOSPITAL_BASED_OUTPATIENT_CLINIC_OR_DEPARTMENT_OTHER): Payer: Medicare Other | Admitting: Physical Medicine & Rehabilitation

## 2017-07-10 VITALS — BP 104/75 | HR 99 | Resp 14

## 2017-07-10 DIAGNOSIS — M1712 Unilateral primary osteoarthritis, left knee: Secondary | ICD-10-CM | POA: Insufficient documentation

## 2017-07-10 NOTE — Patient Instructions (Signed)
[name] intra-articular injection What is this medicine? [name] (HI lan G F [name]) is used to treat osteoarthritis of the knee. It lubricates and cushions the joint, reducing pain in the knee. This medicine may be used for other purposes; ask your health care provider or pharmacist if you have questions. COMMON BRAND NAME(S): Synvisc, Synvisc-One What should I tell my health care provider before I take this medicine? They need to know if you have any of these conditions: -severe knee inflammation -skin conditions or sensitivity -skin or joint infection -venous stasis -an unusual or allergic reaction to [name], hyaluronan (sodium hyaluronate), eggs, other medicines, foods, dyes, or preservatives -pregnant or trying to get pregnant -breast-feeding How should I use this medicine? This medicine is for injection into the knee joint. It is given by a health care professional in a hospital or clinic setting. Talk to your pediatrician regarding the use of this medicine in children. This medicine is not approved for use in children. Overdosage: If you think you have taken too much of this medicine contact a poison control center or emergency room at once. NOTE: This medicine is only for you. Do not share this medicine with others. What if I miss a dose? Keep appointments for follow-up doses as directed. For Synvisc, you will need weekly injections for 3 doses. It is important not to miss your dose. If you will receive Synvisc-One, then only 1 injection will be needed. Call your doctor or health care professional if you are unable to keep an appointment. What may interact with this medicine? Do not take this medicine with any of the following medications: -other injections for the joint like steroids or anesthetics -certain skin disinfectants like benzalkonium chloride This list may not describe all possible interactions. Give your health care provider a list of all the medicines, herbs,  non-prescription drugs, or dietary supplements you use. Also tell them if you smoke, drink alcohol, or use illegal drugs. Some items may interact with your medicine. What should I watch for while using this medicine? Tell your doctor or healthcare professional if your symptoms do not start to get better or if they get worse. Your condition will be monitored carefully while you are receiving this medicine. Most persons get pain relief for up to 6 months after treatment. Avoid strenuous activities (high-impact sports, jogging) or major weight-bearing activities for 48 hours after the injection. What side effects may I notice from receiving this medicine? Side effects that you should report to your doctor or health care professional as soon as possible: -allergic reactions like skin rash, itching or hives, swelling of the face, lips, or tongue -difficulty breathing -fever or chills -severe joint pain or swelling -unusual bleeding or bruising Side effects that usually do not require medical attention (report to your doctor or health care professional if they continue or are bothersome): -dizziness -flushing -general ill feeling or flu-like symptoms -headache -minor joint pain or swelling -muscle pain or cramps -pain, redness, irritation or bruising at site of injection This list may not describe all possible side effects. Call your doctor for medical advice about side effects. You may report side effects to FDA at 1-800-FDA-1088. Where should I keep my medicine? This drug is given in a hospital or clinic and will not be stored at home. NOTE: This sheet is a summary. It may not cover all possible information. If you have questions about this medicine, talk to your doctor, pharmacist, or health care provider.    2018 Elsevier/Gold Standard (2015-11-05 11:48:41)  

## 2017-07-10 NOTE — Progress Notes (Signed)
Indication end-stage osteoarthritis of the knee with pain that limits mobility and does not respond to oral medications.    Medial aspect of the knee was imaged, identified joint space, identified patella, femur, tibia. 25-gauge 1.5 inch needle was inserted 1 mL of 1% lidocaine were infiltrated into the skin and subcutaneous tissue. Then a 21-gauge, 2 inch needle was inserted Into the joint . Negative drawback for blood. 6 mL of Synvisc-1 were injected. Patient tolerated procedure well Post procedure instructions given

## 2017-07-11 ENCOUNTER — Ambulatory Visit: Payer: Medicare Other | Admitting: Physical Therapy

## 2017-07-11 ENCOUNTER — Encounter: Payer: Self-pay | Admitting: Physical Therapy

## 2017-07-11 DIAGNOSIS — M6281 Muscle weakness (generalized): Secondary | ICD-10-CM

## 2017-07-11 DIAGNOSIS — R278 Other lack of coordination: Secondary | ICD-10-CM

## 2017-07-11 NOTE — Therapy (Signed)
[facility] [address] [place], [place], [address] Phone: [phone]   Fax:  [phone]  Physical Therapy Treatment  Patient Details  Name: [name] MRN: [id number] Date of Birth: Dec 22, 1940 Referring Provider: Dr. Gaynelle Arabian III  Encounter Date: 07/11/2017      PT End of Session - 07/11/17 0842    Visit Number 41   Number of Visits 50   Date for PT Re-Evaluation 08/25/17   Authorization Type Medicare & G-codes with proress note every 10th visit    Authorization Time Period 9/11 end of certification for pelvic floor PT; 9/14 end of certification for neuro PT   PT Start Time 0844   PT Stop Time 0925   PT Time Calculation (min) 41 min   Activity Tolerance Patient tolerated treatment well   Behavior During Therapy [facility] for tasks assessed/performed      Past Medical History:  Diagnosis Date  . Atrial fibrillation (HCC)   . Chronic back pain    "mid back down into lower back" (08/25/2014)  . Frequent falls   . GERD (gastroesophageal reflux disease)   . Hypertriglyceridemia   . OSA on CPAP   . Osteoarthritis    "knees, hands, back" (08/25/2014)  . Pneumonia ~ 2000 X 1  . Subdural hematoma (HCC) july 2015   S/P fall while on Coumadin  . T12 compression fracture (HCC) 2012  . Type II diabetes mellitus (HCC)     Past Surgical History:  Procedure Laterality Date  . APPENDECTOMY  2012  . CATARACT EXTRACTION W/ INTRAOCULAR LENS  IMPLANT, BILATERAL Bilateral 2000's  . IR GENERIC HISTORICAL  10/15/2016   IR PERCUTANEOUS ART THROMBECTOMY/INFUSION INTRACRANIAL INC DIAG ANGIO 10/15/2016 [name], MD [facility]  . IR GENERIC HISTORICAL  12/13/2016   IR RADIOLOGIST EVAL & MGMT 12/13/2016 [facility]  . RADIOLOGY WITH ANESTHESIA N/A 10/15/2016   Procedure: RADIOLOGY WITH ANESTHESIA;  Surgeon: [name], MD;  Location: [facility];  Service: Radiology;  Laterality: N/A;  . TOTAL ABDOMINAL HYSTERECTOMY   1990    There were no vitals filed for this visit.      Subjective Assessment - 07/11/17 0848    Subjective Patient had an injection in the knee yesterday and the knee is feeling better today, but hurt when they gave the shot.     Pertinent History falls with subdural hematoma, Afib, chronic back pain, T12 compression fx, type 2 DM, OA, PNA   Currently in Pain? No/denies                         [facility] Adult PT Treatment/Exercise - 07/11/17 0001      Neuro Re-ed    Neuro Re-ed Details  sitting on green ball, bouncing and rocking side to side, PF contract and relax, circles both ways  circles clockwise very     Lumbar Exercises: Standing   Other Standing Lumbar Exercises shoulder ER yellow band, row red band - 15x  cues for posture and core engaged   Other Standing Lumbar Exercises hip flexion and abduction with PF contract and core engaged     Lumbar Exercises: Seated   Long Arc Quad on [place] Strengthening;Right;Left;10 reps   Hip Flexion on Ball Strengthening;Right;Left;10 reps  3 sec hold, UE x 1   Hip Flexion on Ball Limitations sitting on ball horizontal abd sholder with yellow band 10x     Lumbar Exercises: Supine   Bridge 20 reps  with PF contract while holding     Lumbar Exercises: Sidelying   Clam 20 reps;2 seconds  PF squeeze with lifting                  PT Short Term Goals - 06/26/17 1320      PT SHORT TERM GOAL #7   Title able to perform pelvic floor contraction 3 sec hold for 10 reps due to improved muscle endurance and ability to relax pelvic floor muscles fully between reps for improved muscle function to control bladder   Time 4   Period Weeks   Status Achieved     PT SHORT TERM GOAL #8   Title able to use urge to void techniques correctly for report of greater bladder control and 25% reduction in urinary frequency   Time 4   Period Weeks   Status Achieved           PT Long Term Goals - 07/11/17 0929      PT LONG  TERM GOAL #7   Title FOTO< or = to 44%limited urinary problem survey   Time 12   Period Weeks   Status On-going     PT LONG TERM GOAL  #11   TITLE ability to walk to the commode without leaking urine due to increased strength and endurance   Baseline can do lately on medication, not consistent yet   Time 8   Period Weeks   Status On-going               Plan - 07/11/17 0850    Clinical Impression Statement Patient has had leakage only 2x/day lately.  She reports she is able to make it to the bathroom at night on the medication.  She is demonstrating improved posture and strength which is helping her pelvic floor function more effectively.  She will benefit from skilled PT to continue working on posture and strength and endurance to be able to manage bladder function more   Rehab Potential Good   Clinical Impairments Affecting Rehab Potential pain of chronic nature   PT Treatment/Interventions ADLs/Self Care Home Management;Canalith Repostioning;Electrical Stimulation;DME Instruction;Gait training;Stair training;Functional mobility training;Therapeutic activities;Therapeutic exercise;Balance training;Neuromuscular re-education;Cognitive remediation;Patient/family education;Orthotic Fit/Training;Energy conservation;Taping;Vestibular;Biofeedback;Cryotherapy;Moist Heat;Ultrasound;Manual techniques   PT Next Visit Plan continue physical therapy for urinary leakage to improve strength in different positions and walking   Consulted and Agree with Plan of Care Patient;Other (Comment)  interpreter      Patient will benefit from skilled therapeutic intervention in order to improve the following deficits and impairments:  Abnormal gait, Decreased endurance, Decreased balance, Decreased cognition, Decreased mobility, Decreased strength, Difficulty walking, Dizziness, Impaired sensation, Pain, Postural dysfunction, Increased muscle spasms, Decreased safety awareness  Visit Diagnosis: Muscle  weakness (generalized)  Other lack of coordination     Problem List Patient Active Problem List   Diagnosis Date Noted  . Primary osteoarthritis of left knee 02/09/2017  . SDH (subdural hematoma) (HCC)   . Urinary frequency   . Type 2 diabetes mellitus with peripheral neuropathy (HCC)   . Cough   . Gait disturbance, post-stroke 10/20/2016  . Hemiparesis affecting left side as late effect of stroke (HCC) 10/20/2016  . Essential hypertension 10/19/2016  . Hyperlipidemia LDL goal <70 10/19/2016  . 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  . Left hemiparesis (HCC)   . Atrial fibrillation (HCC)   . History of fall   . History of traumatic subdural hematoma   . Vascular headache   . Hypokalemia   . Leukocytosis   .  Acute blood loss anemia   . Hypoalbuminemia due to protein-calorie malnutrition (HCC)   . Dysphagia, post-stroke   . Acute respiratory failure (HCC)   . Acute embolic stroke (HCC) - R putamen/caudate and R insular infarcts s/p TICI3 revascularization w/ mechanical thrombectomy d/t AF not on [facility] 10/15/2016  . Pressure injury of skin 10/15/2016  . HCAP (healthcare-associated pneumonia) 11/09/2014  . Weakness 08/25/2014  . Acute bronchitis 08/25/2014  . UTI (lower urinary tract infection) 08/25/2014  . Fracture of lumbar spine (HCC) 08/25/2014  . Subdural hematoma, post-traumatic (HCC) 05/12/2014  . Chronic atrial fibrillation (HCC) 05/12/2014  . Diabetes mellitus (HCC) 05/12/2014  . OSA on CPAP 05/12/2014    Vincente Poli, PT 07/11/2017, 9:30 AM  [facility] [address] [place], [place], [address] Phone: [phone]   Fax:  [phone]  Name: [name] MRN: [id number] Date of Birth: 06-28-1941

## 2017-07-18 ENCOUNTER — Ambulatory Visit: Payer: Medicare Other | Attending: Urology | Admitting: Physical Therapy

## 2017-07-18 DIAGNOSIS — M6281 Muscle weakness (generalized): Secondary | ICD-10-CM | POA: Diagnosis not present

## 2017-07-18 DIAGNOSIS — R278 Other lack of coordination: Secondary | ICD-10-CM | POA: Diagnosis present

## 2017-07-18 NOTE — Therapy (Signed)
[facility] [address] [place], [place], [address] Phone: [phone]   Fax:  [phone]  Physical Therapy Treatment  Patient Details  Name: [name] MRN: [id number] Date of Birth: 12-06-1940 Referring Provider: Dr. Gaynelle Arabian III  Encounter Date: 07/18/2017      PT End of Session - 07/18/17 0850    Visit Number 42   Number of Visits 50   Date for PT Re-Evaluation 08/25/17   Authorization Type Medicare & G-codes with proress note every 10th visit    Authorization Time Period 11/9 end of certification for pelvic floor PT; 9/14 end of certification for neuro PT   PT Start Time 0845   PT Stop Time 0925   PT Time Calculation (min) 40 min   Activity Tolerance Patient tolerated treatment well   Behavior During Therapy [facility] for tasks assessed/performed      Past Medical History:  Diagnosis Date  . Atrial fibrillation (HCC)   . Chronic back pain    "mid back down into lower back" (08/25/2014)  . Frequent falls   . GERD (gastroesophageal reflux disease)   . Hypertriglyceridemia   . OSA on CPAP   . Osteoarthritis    "knees, hands, back" (08/25/2014)  . Pneumonia ~ 2000 X 1  . Subdural hematoma (HCC) july 2015   S/P fall while on Coumadin  . T12 compression fracture (HCC) 2012  . Type II diabetes mellitus (HCC)     Past Surgical History:  Procedure Laterality Date  . APPENDECTOMY  2012  . CATARACT EXTRACTION W/ INTRAOCULAR LENS  IMPLANT, BILATERAL Bilateral 2000's  . IR GENERIC HISTORICAL  10/15/2016   IR PERCUTANEOUS ART THROMBECTOMY/INFUSION INTRACRANIAL INC DIAG ANGIO 10/15/2016 [name], MD [facility]  . IR GENERIC HISTORICAL  12/13/2016   IR RADIOLOGIST EVAL & MGMT 12/13/2016 [facility]  . RADIOLOGY WITH ANESTHESIA N/A 10/15/2016   Procedure: RADIOLOGY WITH ANESTHESIA;  Surgeon: [name], MD;  Location: [facility];  Service: Radiology;  Laterality: N/A;  . TOTAL ABDOMINAL HYSTERECTOMY   1990    There were no vitals filed for this visit.      Subjective Assessment - 07/18/17 0933    Subjective Things have been better lately.  Less leakage and making it to the bathroom.   Pertinent History falls with subdural hematoma, Afib, chronic back pain, T12 compression fx, type 2 DM, OA, PNA   Limitations Other (comment)   Patient Stated Goals to treat her weak legs and walk without AD; reduce urinary leakage   Currently in Pain? No/denies                         [facility] Adult PT Treatment/Exercise - 07/18/17 0001      Neuro Re-ed    Neuro Re-ed Details  sitting and standing posture with pelvic floor contraction     Lumbar Exercises: Standing   Row Strengthening;Both;20 reps;Theraband  red band   Other Standing Lumbar Exercises hip flexion, extension and abduction with PF contract and core engaged - 1 lb ankle - 20x each     Lumbar Exercises: Seated   Sit to Stand 10 reps  bracing pelvic floor                  PT Short Term Goals - 06/26/17 1320      PT SHORT TERM GOAL #7   Title able to perform pelvic floor contraction 3 sec hold for 10 reps  due to improved muscle endurance and ability to relax pelvic floor muscles fully between reps for improved muscle function to control bladder   Time 4   Period Weeks   Status Achieved     PT SHORT TERM GOAL #8   Title able to use urge to void techniques correctly for report of greater bladder control and 25% reduction in urinary frequency   Time 4   Period Weeks   Status Achieved           PT Long Term Goals - 07/11/17 0929      PT LONG TERM GOAL #7   Title FOTO< or = to 44%limited urinary problem survey   Time 12   Period Weeks   Status On-going     PT LONG TERM GOAL  #11   TITLE ability to walk to the commode without leaking urine due to increased strength and endurance   Baseline can do lately on medication, not consistent yet   Time 8   Period Weeks   Status On-going                Plan - 07/18/17 0913    Clinical Impression Statement Patient reports leakage only 2x this week.  She is walking more upright with more steady gait.  Pt continues to demonstrate improved strength and is tolerating more standing exercises and able to control movements better.  She is still needing assistance with balancing on ball.  She continues to benefit from skilled PT to improved strength and coordination.   Clinical Impairments Affecting Rehab Potential pain of chronic nature   PT Treatment/Interventions ADLs/Self Care Home Management;Canalith Repostioning;Electrical Stimulation;DME Instruction;Gait training;Stair training;Functional mobility training;Therapeutic activities;Therapeutic exercise;Balance training;Neuromuscular re-education;Cognitive remediation;Patient/family education;Orthotic Fit/Training;Energy conservation;Taping;Vestibular;Biofeedback;Cryotherapy;Moist Heat;Ultrasound;Manual techniques   PT Next Visit Plan continue physical therapy for urinary leakage to improve strength in different positions and walking   Consulted and Agree with Plan of Care Patient;Other (Comment);Family member/caregiver      Patient will benefit from skilled therapeutic intervention in order to improve the following deficits and impairments:  Abnormal gait, Decreased endurance, Decreased balance, Decreased cognition, Decreased mobility, Decreased strength, Difficulty walking, Dizziness, Impaired sensation, Pain, Postural dysfunction, Increased muscle spasms, Decreased safety awareness  Visit Diagnosis: Muscle weakness (generalized)  Other lack of coordination     Problem List Patient Active Problem List   Diagnosis Date Noted  . Primary osteoarthritis of left knee 02/09/2017  . SDH (subdural hematoma) (HCC)   . Urinary frequency   . Type 2 diabetes mellitus with peripheral neuropathy (HCC)   . Cough   . Gait disturbance, post-stroke 10/20/2016  . Hemiparesis affecting  left side as late effect of stroke (HCC) 10/20/2016  . Essential hypertension 10/19/2016  . Hyperlipidemia LDL goal <70 10/19/2016  . 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  . Left hemiparesis (HCC)   . Atrial fibrillation (HCC)   . History of fall   . History of traumatic subdural hematoma   . Vascular headache   . Hypokalemia   . Leukocytosis   . Acute blood loss anemia   . Hypoalbuminemia due to protein-calorie malnutrition (HCC)   . Dysphagia, post-stroke   . Acute respiratory failure (HCC)   . Acute embolic stroke (HCC) - R putamen/caudate and R insular infarcts s/p TICI3 revascularization w/ mechanical thrombectomy d/t AF not on [facility] 10/15/2016  . Pressure injury of skin 10/15/2016  . HCAP (healthcare-associated pneumonia) 11/09/2014  . Weakness 08/25/2014  . Acute bronchitis 08/25/2014  . UTI (lower urinary tract infection) 08/25/2014  .  Fracture of lumbar spine (HCC) 08/25/2014  . Subdural hematoma, post-traumatic (HCC) 05/12/2014  . Chronic atrial fibrillation (HCC) 05/12/2014  . Diabetes mellitus (HCC) 05/12/2014  . OSA on CPAP 05/12/2014    Vincente Poli, PT 07/18/2017, 9:33 AM  [facility] [address], [address] [place], [place], [address] Phone: [phone]   Fax:  [phone]  Name: Anjanae Woehrle MRN: [id number] Date of Birth: 09-12-1941

## 2017-07-24 ENCOUNTER — Other Ambulatory Visit: Payer: Self-pay | Admitting: Physical Medicine & Rehabilitation

## 2017-07-25 ENCOUNTER — Ambulatory Visit: Payer: Medicare Other | Admitting: Physical Therapy

## 2017-07-25 ENCOUNTER — Encounter: Payer: Self-pay | Admitting: Physical Therapy

## 2017-07-25 DIAGNOSIS — R278 Other lack of coordination: Secondary | ICD-10-CM

## 2017-07-25 DIAGNOSIS — M6281 Muscle weakness (generalized): Secondary | ICD-10-CM | POA: Diagnosis not present

## 2017-07-25 NOTE — Therapy (Signed)
[facility] [address], [address] [place], [place], [address] Phone: [phone]   Fax:  [phone]  Physical Therapy Treatment  Patient Details  Name: [name] MRN: [id number] Date of Birth: 1941-05-11 Referring Provider: Dr. Gaynelle Arabian III  Encounter Date: 07/25/2017      PT End of Session - 07/25/17 0847    Visit Number 43   Number of Visits 50   Date for PT Re-Evaluation 08/25/17   Authorization Type Medicare & G-codes with proress note every 10th visit    PT Start Time 0845   PT Stop Time 0929   PT Time Calculation (min) 44 min   Activity Tolerance Patient tolerated treatment well   Behavior During Therapy [facility] for tasks assessed/performed      Past Medical History:  Diagnosis Date  . Atrial fibrillation (HCC)   . Chronic back pain    "mid back down into lower back" (08/25/2014)  . Frequent falls   . GERD (gastroesophageal reflux disease)   . Hypertriglyceridemia   . OSA on CPAP   . Osteoarthritis    "knees, hands, back" (08/25/2014)  . Pneumonia ~ 2000 X 1  . Subdural hematoma (HCC) july 2015   S/P fall while on Coumadin  . T12 compression fracture (HCC) 2012  . Type II diabetes mellitus (HCC)     Past Surgical History:  Procedure Laterality Date  . APPENDECTOMY  2012  . CATARACT EXTRACTION W/ INTRAOCULAR LENS  IMPLANT, BILATERAL Bilateral 2000's  . IR GENERIC HISTORICAL  10/15/2016   IR PERCUTANEOUS ART THROMBECTOMY/INFUSION INTRACRANIAL INC DIAG ANGIO 10/15/2016 [name], MD [facility]  . IR GENERIC HISTORICAL  12/13/2016   IR RADIOLOGIST EVAL & MGMT 12/13/2016 [facility]  . RADIOLOGY WITH ANESTHESIA N/A 10/15/2016   Procedure: RADIOLOGY WITH ANESTHESIA;  Surgeon: [name], MD;  Location: [facility];  Service: Radiology;  Laterality: N/A;  . TOTAL ABDOMINAL HYSTERECTOMY  1990    There were no vitals filed for this visit.      Subjective Assessment - 07/25/17 0925    Subjective I am doing well.  I did not take the medication last night and only got up 2x to go to the bathroom.   Patient is accompained by: Family member;Interpreter   Pertinent History falls with subdural hematoma, Afib, chronic back pain, T12 compression fx, type 2 DM, OA, PNA   Limitations Other (comment)   Patient Stated Goals to treat her weak legs and walk without AD; reduce urinary leakage   Currently in Pain? No/denies                         [facility] Adult PT Treatment/Exercise - 07/25/17 0001      Neuro Re-ed    Neuro Re-ed Details  sitting and standing posture with pelvic floor contraction, breathing, sitting on green ball, guided through getting rid of urge to void     Lumbar Exercises: Aerobic   Stationary Bike nustep     Lumbar Exercises: Standing   Row Strengthening;Both;20 reps;Theraband  red band   Other Standing Lumbar Exercises pallof punches standing one leg on grey disc - 10x bilat and facing each way   Other Standing Lumbar Exercises hip flexion, extension and abduction with PF contract and core engaged - 2 lb ankle - 20x each     Lumbar Exercises: Seated   Sit to Stand 20 reps  bracing pelvic floor  PT Short Term Goals - 06/26/17 1320      PT SHORT TERM GOAL #7   Title able to perform pelvic floor contraction 3 sec hold for 10 reps due to improved muscle endurance and ability to relax pelvic floor muscles fully between reps for improved muscle function to control bladder   Time 4   Period Weeks   Status Achieved     PT SHORT TERM GOAL #8   Title able to use urge to void techniques correctly for report of greater bladder control and 25% reduction in urinary frequency   Time 4   Period Weeks   Status Achieved           PT Long Term Goals - 07/25/17 0920      PT LONG TERM GOAL #7   Title FOTO< or = to 44%limited urinary problem survey   Time 12   Period Weeks   Status On-going     PT LONG TERM GOAL   #11   TITLE ability to walk to the commode without leaking urine due to increased strength and endurance   Time 8   Period Weeks   Status On-going               Plan - 07/25/17 9629    Clinical Impression Statement Patient did well with exercises today.  She was able to increase resistence and difficulty of exercises with standing with one foot on grey disc.  She continues to need some cues for breathing using diaphragm and improved posture throughout treatment.  Skilled PT continues to be benefitcial working on improved function and self care activities.   Clinical Impairments Affecting Rehab Potential pain of chronic nature   PT Treatment/Interventions ADLs/Self Care Home Management;Canalith Repostioning;Electrical Stimulation;DME Instruction;Gait training;Stair training;Functional mobility training;Therapeutic activities;Therapeutic exercise;Balance training;Neuromuscular re-education;Cognitive remediation;Patient/family education;Orthotic Fit/Training;Energy conservation;Taping;Vestibular;Biofeedback;Cryotherapy;Moist Heat;Ultrasound;Manual techniques   PT Next Visit Plan continue physical therapy for urinary leakage to improve strength in different positions and walking   PT Home Exercise Plan progress as needed   Consulted and Agree with Plan of Care Patient;Other (Comment);Family member/caregiver   Family Member Consulted daughter      Patient will benefit from skilled therapeutic intervention in order to improve the following deficits and impairments:  Abnormal gait, Decreased endurance, Decreased balance, Decreased cognition, Decreased mobility, Decreased strength, Difficulty walking, Dizziness, Impaired sensation, Pain, Postural dysfunction, Increased muscle spasms, Decreased safety awareness  Visit Diagnosis: Muscle weakness (generalized)  Other lack of coordination     Problem List Patient Active Problem List   Diagnosis Date Noted  . Primary osteoarthritis of left  knee 02/09/2017  . SDH (subdural hematoma) (HCC)   . Urinary frequency   . Type 2 diabetes mellitus with peripheral neuropathy (HCC)   . Cough   . Gait disturbance, post-stroke 10/20/2016  . Hemiparesis affecting left side as late effect of stroke (HCC) 10/20/2016  . Essential hypertension 10/19/2016  . Hyperlipidemia LDL goal <70 10/19/2016  . 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  . Left hemiparesis (HCC)   . Atrial fibrillation (HCC)   . History of fall   . History of traumatic subdural hematoma   . Vascular headache   . Hypokalemia   . Leukocytosis   . Acute blood loss anemia   . Hypoalbuminemia due to protein-calorie malnutrition (HCC)   . Dysphagia, post-stroke   . Acute respiratory failure (HCC)   . Acute embolic stroke (HCC) - R putamen/caudate and R insular infarcts s/p TICI3 revascularization w/ mechanical thrombectomy d/t AF not  on [facility] 10/15/2016  . Pressure injury of skin 10/15/2016  . HCAP (healthcare-associated pneumonia) 11/09/2014  . Weakness 08/25/2014  . Acute bronchitis 08/25/2014  . UTI (lower urinary tract infection) 08/25/2014  . Fracture of lumbar spine (HCC) 08/25/2014  . Subdural hematoma, post-traumatic (HCC) 05/12/2014  . Chronic atrial fibrillation (HCC) 05/12/2014  . Diabetes mellitus (HCC) 05/12/2014  . OSA on CPAP 05/12/2014    Vincente Poli, PT 07/25/2017, 9:27 AM  [facility] [address] [place], [place], [address] Phone: [phone]   Fax:  [phone]  Name: [name] MRN: [id number] Date of Birth: November 17, 1940

## 2017-08-02 ENCOUNTER — Encounter: Payer: Self-pay | Admitting: Physical Therapy

## 2017-08-02 ENCOUNTER — Ambulatory Visit: Payer: Medicare Other | Admitting: Physical Therapy

## 2017-08-02 DIAGNOSIS — M6281 Muscle weakness (generalized): Secondary | ICD-10-CM

## 2017-08-02 DIAGNOSIS — R278 Other lack of coordination: Secondary | ICD-10-CM

## 2017-08-02 NOTE — Therapy (Signed)
[facility] [address] [place], [place], [address] Phone: [phone]   Fax:  [phone]  Physical Therapy Treatment  Patient Details  Name: [name] MRN: [id number] Date of Birth: 11/18/1940 Referring Provider: Dr. Gaynelle Arabianonald Davis [name]  Encounter Date: 08/02/2017      PT End of Session - 08/02/17 0943    Visit Number 44   Number of Visits 50   Date for PT Re-Evaluation 08/25/17   Authorization Type Medicare & G-codes with proress note every 10th visit    Authorization Time Period 11/9 end of certification for pelvic floor PT; 9/14 end of certification for neuro PT   PT Start Time 0936  came late and wrong time   PT Stop Time 1015   PT Time Calculation (min) 39 min   Activity Tolerance Patient tolerated treatment well   Behavior During Therapy [name] for tasks assessed/performed      Past Medical History:  Diagnosis Date  . Atrial fibrillation (HCC)   . Chronic back pain    "mid back down into lower back" (08/25/2014)  . Frequent falls   . GERD (gastroesophageal reflux disease)   . Hypertriglyceridemia   . OSA on CPAP   . Osteoarthritis    "knees, hands, back" (08/25/2014)  . Pneumonia ~ 2000 X 1  . Subdural hematoma (HCC) july 2015   S/P fall while on Coumadin  . T12 compression fracture (HCC) 2012  . Type II diabetes mellitus (HCC)     Past Surgical History:  Procedure Laterality Date  . APPENDECTOMY  2012  . CATARACT EXTRACTION W/ INTRAOCULAR LENS  IMPLANT, BILATERAL Bilateral 2000's  . IR GENERIC HISTORICAL  10/15/2016   IR PERCUTANEOUS ART THROMBECTOMY/INFUSION INTRACRANIAL INC DIAG ANGIO 10/15/2016 Lauren CottonSanjeev Deveshwar, MD [facility]  . IR GENERIC HISTORICAL  12/13/2016   IR RADIOLOGIST EVAL & MGMT 12/13/2016 [facility]  . RADIOLOGY WITH ANESTHESIA N/A 10/15/2016   Procedure: RADIOLOGY WITH ANESTHESIA;  Surgeon: Lauren CottonSanjeev Deveshwar, MD;  Location: [facility] OR;  Service: Radiology;  Laterality: N/A;  .  TOTAL ABDOMINAL HYSTERECTOMY  1990    There were no vitals filed for this visit.      Subjective Assessment - 08/02/17 0942    Subjective I have one more visit.  I feel like next visit could be my last.    Patient is accompained by: Interpreter;Family member   Pertinent History falls with subdural hematoma, Afib, chronic back pain, T12 compression fx, type 2 DM, OA, PNA   Limitations Other (comment)   Patient Stated Goals to treat her weak legs and walk without AD; reduce urinary leakage   Currently in Pain? No/denies   Multiple Pain Sites No                         [facility] Adult PT Treatment/Exercise - 08/02/17 0001      Lumbar Exercises: Aerobic   Stationary Bike nustep L2 x 6 min  seat #4, Arm#5     Lumbar Exercises: Standing   Row Strengthening;Both;20 reps;Theraband  red band   Row Limitations verbal cues to not lean back and squeeze shoulder blades with pelvic floor contraction     Knee/Hip Exercises: Standing   Knee Flexion Right;Left;2 sets;10 reps   Knee Flexion Limitations holding on and pelvic floor contraction   Hip Abduction Right;Left;Stengthening;2 sets;10 reps;Knee straight  hold on   Abduction Limitations pelvic floor contraction; assistance to bring leg out to side instead of  foward   Hip Extension Stengthening;Right;Left;2 sets;10 reps   Extension Limitations pelvic floor contraction holding on   Other Standing Knee Exercises stand with one foot on [name] disc and punch 15 times each way with pelvic floor contraciton                  PT Short Term Goals - 06/26/17 1320      PT SHORT TERM GOAL #7   Title able to perform pelvic floor contraction 3 sec hold for 10 reps due to improved muscle endurance and ability to relax pelvic floor muscles fully between reps for improved muscle function to control bladder   Time 4   Period Weeks   Status Achieved     PT SHORT TERM GOAL #8   Title able to use urge to void techniques correctly  for report of greater bladder control and 25% reduction in urinary frequency   Time 4   Period Weeks   Status Achieved           PT Long Term Goals - 07/25/17 0920      PT LONG TERM GOAL #7   Title FOTO< or = to 44%limited urinary problem survey   Time 12   Period Weeks   Status On-going     PT LONG TERM GOAL  #11   TITLE ability to walk to the commode without leaking urine due to increased strength and endurance   Time 8   Period Weeks   Status On-going               Plan - 08/02/17 1012    Clinical Impression Statement Patient was able to hold her urine to walk to the bathroom while in therapy. Patient is now walking with a quad cane instead of walker.  Patient is able to contract her pelvic floor with exercise.  Patient reports urinary leakage is better and she will be ready for discharge next visit.  Patient will benefit from skilled therapy to improve strength to reduce urinary leakage.    Rehab Potential Good   Clinical Impairments Affecting Rehab Potential pain of chronic nature   PT Frequency 3x / week   PT Duration 4 weeks   PT Treatment/Interventions ADLs/Self Care Home Management;Canalith Repostioning;Electrical Stimulation;DME Instruction;Gait training;Stair training;Functional mobility training;Therapeutic activities;Therapeutic exercise;Balance training;Neuromuscular re-education;Cognitive remediation;Patient/family education;Orthotic Fit/Training;Energy conservation;Taping;Vestibular;Biofeedback;Cryotherapy;Moist Heat;Ultrasound;Manual techniques   PT Next Visit Plan Discharge to Home exercise program next visit; review HEP   PT Home Exercise Plan update HEP   Consulted and Agree with Plan of Care Patient;Family member/caregiver;Other (Comment)  interpreter on the phone   Family Member Consulted daughter      Patient will benefit from skilled therapeutic intervention in order to improve the following deficits and impairments:  Abnormal gait, Decreased  endurance, Decreased balance, Decreased cognition, Decreased mobility, Decreased strength, Difficulty walking, Dizziness, Impaired sensation, Pain, Postural dysfunction, Increased muscle spasms, Decreased safety awareness  Visit Diagnosis: Muscle weakness (generalized)  Other lack of coordination     Problem List Patient Active Problem List   Diagnosis Date Noted  . Primary osteoarthritis of left knee 02/09/2017  . SDH (subdural hematoma) (HCC)   . Urinary frequency   . Type 2 diabetes mellitus with peripheral neuropathy (HCC)   . Cough   . Gait disturbance, post-stroke 10/20/2016  . Hemiparesis affecting left side as late effect of stroke (HCC) 10/20/2016  . Essential hypertension 10/19/2016  . Hyperlipidemia LDL goal <70 10/19/2016  . 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  . Left  hemiparesis (HCC)   . Atrial fibrillation (HCC)   . History of fall   . History of traumatic subdural hematoma   . Vascular headache   . Hypokalemia   . Leukocytosis   . Acute blood loss anemia   . Hypoalbuminemia due to protein-calorie malnutrition (HCC)   . Dysphagia, post-stroke   . Acute respiratory failure (HCC)   . Acute embolic stroke (HCC) - R putamen/caudate and R insular infarcts s/p TICI3 revascularization w/ mechanical thrombectomy d/t AF not on [facility] 10/15/2016  . Pressure injury of skin 10/15/2016  . HCAP (healthcare-associated pneumonia) 11/09/2014  . Weakness 08/25/2014  . Acute bronchitis 08/25/2014  . UTI (lower urinary tract infection) 08/25/2014  . Fracture of lumbar spine (HCC) 08/25/2014  . Subdural hematoma, post-traumatic (HCC) 05/12/2014  . Chronic atrial fibrillation (HCC) 05/12/2014  . Diabetes mellitus (HCC) 05/12/2014  . OSA on CPAP 05/12/2014    Eulis Foster, PT 08/02/17 10:16 AM   [facility] [address] [place], [place], [address] Phone: [phone]   Fax:  [phone]  Name: [name] MRN:  [id number] Date of Birth: 11-30-1940

## 2017-08-08 ENCOUNTER — Encounter: Payer: Self-pay | Admitting: Physical Therapy

## 2017-08-08 ENCOUNTER — Ambulatory Visit: Payer: Medicare Other | Admitting: Physical Therapy

## 2017-08-08 DIAGNOSIS — M6281 Muscle weakness (generalized): Secondary | ICD-10-CM

## 2017-08-08 DIAGNOSIS — R278 Other lack of coordination: Secondary | ICD-10-CM

## 2017-08-08 NOTE — Patient Instructions (Addendum)
Sit to Stand: Phase 3    Sitting, squeeze pelvic floor and hold. Lean trunk forward. Push up on arms and stand up. Relax. Repeat _10__ times. Do _1__ times a day. Make sure you ar bending at your hips.  Copyright  VHI. All rights reserved.    External Rotation: Hip - Knees Apart With Pelvic Floor (Sitting)    Sit, band tied just above knees. Squeeze pelvic floor while pulling knees apart. Hold for _5__ seconds. Rest for _5__ seconds. Repeat _10__ times. Do __1_ times a day.  Copyright  VHI. All rights reserved.   Adduction: Hip - Knees Together With Pelvic Floor (Sitting)    Sit with towel roll between knees. Squeeze pelvic floor while pushing knees together. Hold for _5__ seconds. Rest for _5__ seconds. Repeat _10__ times. Do _1__ times a day.  Copyright  VHI. All rights reserved.   Walk for 15 minutes per day or break up to 3 5 minute sessions. ABDUCTION: Standing (Power)    Contract the pelvic floor. Stand, feet flat. Lift right leg out to side as quickly as possible.  Complete __1_ sets of _10__ repetitions. Perform 1___ sessions per day. Then do the other leg.  Copyright  VHI. All rights reserved.   EXTENSION: Standing (Active)    Contract the pelvic floor. Stand, both feet flat. Draw right leg behind body as far as possible.  Complete _1__ sets of _10__ repetitions. Perform _1__ sessions per day. Then do the other leg.  http://gtsc.exer.us/76   Copyright  VHI. All rights reserved.   Bracing With Bridging (Hook-Lying)    With neutral spine, tighten pelvic floor and abdominals and hold. Lift bottom. Repeat _10__ times. Do _1__ times a day.  The add the ball between the knees and do 10 times.   Copyright  VHI. All rights reserved.   High Stepping in Place (Sitting)    Contract the pelvic floor. Sitting, alternately lift knees as high as possible. Keep torso erect. Repeat __10__ times, each leg.  Copyright  VHI. All rights reserved.    [facility] [address], [address] [place], [place] [address] Phone # [phone] Fax [phone]

## 2017-08-08 NOTE — Therapy (Signed)
[facility] [address], [place], [place], [address] Phone: [phone]   Fax:  [phone]  Physical Therapy Treatment  Patient Details  Name: [name] MRN: [id number] Date of Birth: 1941-04-13 Referring Provider: Dr. Tresa Endo III  Encounter Date: 08/08/2017      PT End of Session - 08/08/17 0856    Visit Number 45   Number of Visits 50   Date for PT Re-Evaluation 08/25/17   Authorization Type Medicare & G-codes with proress note every 10th visit    PT Start Time 0845   PT Stop Time 0930   PT Time Calculation (min) 45 min   Activity Tolerance Patient tolerated treatment well   Behavior During Therapy [facility] for tasks assessed/performed      Past Medical History:  Diagnosis Date  . Atrial fibrillation ([place])   . Chronic back pain    "mid back down into lower back" (08/25/2014)  . Frequent falls   . GERD (gastroesophageal reflux disease)   . Hypertriglyceridemia   . OSA on CPAP   . Osteoarthritis    "knees, hands, back" (08/25/2014)  . Pneumonia ~ 2000 X 1  . Subdural hematoma ([place]) july 2015   S/P fall while on Coumadin  . T12 compression fracture ([place]) 2012  . Type II diabetes mellitus ([place])     Past Surgical History:  Procedure Laterality Date  . APPENDECTOMY  2012  . CATARACT EXTRACTION W/ INTRAOCULAR LENS  IMPLANT, BILATERAL Bilateral 2000's  . IR GENERIC HISTORICAL  10/15/2016   IR PERCUTANEOUS ART THROMBECTOMY/INFUSION INTRACRANIAL INC DIAG ANGIO 10/15/2016 [name], MD [facility]  . IR GENERIC HISTORICAL  12/13/2016   IR RADIOLOGIST EVAL & MGMT 12/13/2016 [facility]  . RADIOLOGY WITH ANESTHESIA N/A 10/15/2016   Procedure: RADIOLOGY WITH ANESTHESIA;  Surgeon: [name], MD;  Location: [place];  Service: Radiology;  Laterality: N/A;  . TOTAL ABDOMINAL HYSTERECTOMY  1990    There were no vitals filed for this visit.      Subjective Assessment - 08/08/17 0857    Subjective Patient is ready for discharge.  Patient is able to get to the bathroom in time and not wet her pad.    Patient is accompained by: Interpreter;Family member   Pertinent History falls with subdural hematoma, Afib, chronic back pain, T12 compression fx, type 2 DM, OA, PNA   Limitations Other (comment)   Patient Stated Goals to treat her weak legs and walk without AD; reduce urinary leakage   Currently in Pain? No/denies            [facility] PT Assessment - 08/08/17 0001      Assessment   Medical Diagnosis pelvic floor dysfunction   Referring Provider Dr. Tresa Endo III   Onset Date/Surgical Date 10/05/16   Prior Therapy No     Precautions   Precautions Fall     Restrictions   Weight Bearing Restrictions No     [place] residence   Living Arrangements Children   Available Help at Discharge Family   Type of [place] to enter   Entrance Stairs-Number of Steps 3 from garage   [place] Two level;Able to live on main level with bedroom/bathroom     Prior Function   Level of Independence Independent   Vocation Retired     Associate Professor   Overall Cognitive Status Within Functional Limits for tasks  assessed     Observation/Other Assessments   Focus on Therapeutic Outcomes (FOTO)  61% limited urinary problem survey     Posture/Postural Control   Posture/Postural Control Postural limitations   Postural Limitations Rounded Shoulders;Forward head;Increased thoracic kyphosis     Strength   Overall Strength Comments bil. knees 5/5;    Right Hip Flexion 4/5   Right Hip ABduction 4+/5   Left Hip Flexion 4+/5   Left Hip ABduction 4+/5     Ambulation/Gait   Assistive device Small based quad cane                  Pelvic Floor Special Questions - 08/08/17 0001    Urinary Leakage No   Pad use 1/day           [facility] Adult PT Treatment/Exercise - 08/08/17 0001       Lumbar Exercises: Aerobic   Stationary Bike nustep L2 x 6 min  seat #5, Arm#5; discuss patient progress                PT Education - 08/08/17 1150    Education provided Yes   Education Details updated HEP with exercises that strengthen the pelvic floor while strengthening her hips and core   Person(s) Educated Patient;Other (comment)  interpreter   Methods Explanation;Demonstration;Verbal cues;Handout   Comprehension Verbalized understanding;Returned demonstration          PT Short Term Goals - 06/26/17 1320      PT SHORT TERM GOAL #7   Title able to perform pelvic floor contraction 3 sec hold for 10 reps due to improved muscle endurance and ability to relax pelvic floor muscles fully between reps for improved muscle function to control bladder   Time 4   Period Weeks   Status Achieved     PT SHORT TERM GOAL #8   Title able to use urge to void techniques correctly for report of greater bladder control and 25% reduction in urinary frequency   Time 4   Period Weeks   Status Achieved           PT Long Term Goals - 08/08/17 5809      PT LONG TERM GOAL #8   Title reduced nocturia to 2x/night   Time 12   Period Weeks   Status Achieved     PT LONG TERM GOAL  #9   TITLE reduced pad use to 2/day   Baseline 1 pad   Time 12   Period Weeks   Status Achieved     PT LONG TERM GOAL  #10   TITLE redued urinary frequency to void every 3 hours for less frequent trips to the bathroom and able to wait until at least 8 seconds of full stream of urine is eliminated   Time 12   Period Weeks   Status Achieved     PT LONG TERM GOAL  #11   TITLE ability to walk to the commode without leaking urine due to increased strength and endurance   Time 8   Period Weeks   Status Achieved               Plan - 08/08/17 1151    Clinical Impression Statement Patient has met her goals.  Patient has increased strength of her hips and pelvic floor. Patient is now walking with  a small base quad cane.  Patient only wears 1 pad per day and is typically dry.  Patient is able to hold her urine  till she gets to the commode.  Patient is able to hold her urine for 45 minutes. Patient is independent with her HEP.  Patient is ready for discharge.    Rehab Potential Good   Clinical Impairments Affecting Rehab Potential pain of chronic nature   PT Treatment/Interventions ADLs/Self Care Home Management;Canalith Repostioning;Electrical Stimulation;DME Instruction;Gait training;Stair training;Functional mobility training;Therapeutic activities;Therapeutic exercise;Balance training;Neuromuscular re-education;Cognitive remediation;Patient/family education;Orthotic Fit/Training;Energy conservation;Taping;Vestibular;Biofeedback;Cryotherapy;Moist Heat;Ultrasound;Manual techniques   PT Next Visit Plan Discharge to [place] Current HEP   Recommended Other Services MD signr renewal note   Consulted and Agree with Plan of Care Patient;Family member/caregiver;Other (Comment)   Family Member Consulted daughter      Patient will benefit from skilled therapeutic intervention in order to improve the following deficits and impairments:  Abnormal gait, Decreased endurance, Decreased balance, Decreased cognition, Decreased mobility, Decreased strength, Difficulty walking, Dizziness, Impaired sensation, Pain, Postural dysfunction, Increased muscle spasms, Decreased safety awareness  Visit Diagnosis: Muscle weakness (generalized)  Other lack of coordination       G-Codes - 08-31-17 1156    Functional Assessment Tool Used (Outpatient Only) FOTO and clinical assessment   Functional Limitation Self care   Self Care Goal Status (B1517) At least 40 percent but less than 60 percent impaired, limited or restricted   Self Care Discharge Status [id number]) At least 20 percent but less than 40 percent impaired, limited or restricted      Problem List Patient Active Problem List    Diagnosis Date Noted  . Primary osteoarthritis of left knee 02/09/2017  . SDH (subdural hematoma) ([place])   . Urinary frequency   . Type 2 diabetes mellitus with peripheral neuropathy (HCC)   . Cough   . Gait disturbance, post-stroke 10/20/2016  . Hemiparesis affecting left side as late effect of stroke ([place]) 10/20/2016  . Essential hypertension 10/19/2016  . Hyperlipidemia LDL goal <70 10/19/2016  . 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[place]) 37/07/6268  . Left hemiparesis ([place])   . Atrial fibrillation ([place])   . History of fall   . History of traumatic subdural hematoma   . Vascular headache   . Hypokalemia   . Leukocytosis   . Acute blood loss anemia   . Hypoalbuminemia due to protein-calorie malnutrition ([place])   . Dysphagia, post-stroke   . Acute respiratory failure ([place])   . Acute embolic stroke (HCC) - R putamen/caudate and R insular infarcts s/p TICI3 revascularization w/ mechanical thrombectomy d/t AF not on [facility] 10/15/2016  . Pressure injury of skin 10/15/2016  . HCAP (healthcare-associated pneumonia) 11/09/2014  . Weakness 08/25/2014  . Acute bronchitis 08/25/2014  . UTI (lower urinary tract infection) 08/25/2014  . Fracture of lumbar spine ([place]) 08/25/2014  . Subdural hematoma, post-traumatic ([place]) 05/12/2014  . Chronic atrial fibrillation ([place]) 05/12/2014  . Diabetes mellitus ([place]) 05/12/2014  . OSA on CPAP 05/12/2014    Earlie Counts, PT 2017/08/31 11:58 AM   [facility] [address], [place], [place], [address] Phone: [phone]   Fax:  [phone]  Name: [name] MRN: [id number] Date of Birth: 1941-06-07 PHYSICAL THERAPY DISCHARGE SUMMARY  Visits from Start of Care: 45  Current functional level related to goals / functional outcomes: See above.    Remaining deficits: See above.    Education / Equipment: HEP Plan: Patient agrees to discharge.  Patient goals were met. Patient is being discharged due to  meeting the stated rehab goals.  Thank you for the referral Earlie Counts, PT 08-31-2017  11:58 AM  ?????

## 2017-09-03 ENCOUNTER — Emergency Department (HOSPITAL_COMMUNITY): Payer: Medicare Other

## 2017-09-03 ENCOUNTER — Other Ambulatory Visit: Payer: Self-pay

## 2017-09-03 ENCOUNTER — Emergency Department (HOSPITAL_COMMUNITY)
Admission: EM | Admit: 2017-09-03 | Discharge: 2017-09-03 | Disposition: A | Discharge status: Home / Self Care | Payer: Medicare Other | Attending: Emergency Medicine | Admitting: Emergency Medicine

## 2017-09-03 ENCOUNTER — Encounter (HOSPITAL_COMMUNITY): Payer: Self-pay

## 2017-09-03 DIAGNOSIS — Z7722 Contact with and (suspected) exposure to environmental tobacco smoke (acute) (chronic): Secondary | ICD-10-CM | POA: Diagnosis not present

## 2017-09-03 DIAGNOSIS — Z7984 Long term (current) use of oral hypoglycemic drugs: Secondary | ICD-10-CM | POA: Insufficient documentation

## 2017-09-03 DIAGNOSIS — S301XXA Contusion of abdominal wall, initial encounter: Secondary | ICD-10-CM | POA: Diagnosis not present

## 2017-09-03 DIAGNOSIS — Y939 Activity, unspecified: Secondary | ICD-10-CM | POA: Insufficient documentation

## 2017-09-03 DIAGNOSIS — E785 Hyperlipidemia, unspecified: Secondary | ICD-10-CM | POA: Diagnosis not present

## 2017-09-03 DIAGNOSIS — W01198D Fall on same level from slipping, tripping and stumbling with subsequent striking against other object, subsequent encounter: Secondary | ICD-10-CM | POA: Diagnosis not present

## 2017-09-03 DIAGNOSIS — Z79899 Other long term (current) drug therapy: Secondary | ICD-10-CM | POA: Insufficient documentation

## 2017-09-03 DIAGNOSIS — Y929 Unspecified place or not applicable: Secondary | ICD-10-CM | POA: Insufficient documentation

## 2017-09-03 DIAGNOSIS — I4891 Unspecified atrial fibrillation: Secondary | ICD-10-CM | POA: Insufficient documentation

## 2017-09-03 DIAGNOSIS — E119 Type 2 diabetes mellitus without complications: Secondary | ICD-10-CM | POA: Insufficient documentation

## 2017-09-03 DIAGNOSIS — S2242XD Multiple fractures of ribs, left side, subsequent encounter for fracture with routine healing: Secondary | ICD-10-CM | POA: Insufficient documentation

## 2017-09-03 DIAGNOSIS — W010XXD Fall on same level from slipping, tripping and stumbling without subsequent striking against object, subsequent encounter: Secondary | ICD-10-CM

## 2017-09-03 DIAGNOSIS — Y998 Other external cause status: Secondary | ICD-10-CM | POA: Diagnosis not present

## 2017-09-03 DIAGNOSIS — Z91010 Allergy to peanuts: Secondary | ICD-10-CM | POA: Insufficient documentation

## 2017-09-03 DIAGNOSIS — S2249XD Multiple fractures of ribs, unspecified side, subsequent encounter for fracture with routine healing: Secondary | ICD-10-CM

## 2017-09-03 DIAGNOSIS — S3991XA Unspecified injury of abdomen, initial encounter: Secondary | ICD-10-CM | POA: Diagnosis present

## 2017-09-03 LAB — CBC
HEMATOCRIT: 41.4 % (ref 36.0–46.0)
HEMOGLOBIN: 13.8 g/dL (ref 12.0–15.0)
MCH: 31.2 pg (ref 26.0–34.0)
MCHC: 33.3 g/dL (ref 30.0–36.0)
MCV: 93.5 fL (ref 78.0–100.0)
Platelets: 311 10*3/uL (ref 150–400)
RBC: 4.43 MIL/uL (ref 3.87–5.11)
RDW: 12.2 % (ref 11.5–15.5)
WBC: 9.6 10*3/uL (ref 4.0–10.5)

## 2017-09-03 LAB — BASIC METABOLIC PANEL
ANION GAP: 11 (ref 5–15)
BUN: 9 mg/dL (ref 6–20)
CO2: 22 mmol/L (ref 22–32)
Calcium: 9.4 mg/dL (ref 8.9–10.3)
Chloride: 107 mmol/L (ref 101–111)
Creatinine, Ser: 0.52 mg/dL (ref 0.44–1.00)
Glucose, Bld: 109 mg/dL — ABNORMAL HIGH (ref 65–99)
POTASSIUM: 3.6 mmol/L (ref 3.5–5.1)
SODIUM: 140 mmol/L (ref 135–145)

## 2017-09-03 LAB — I-STAT TROPONIN, ED: Troponin i, poc: 0 ng/mL (ref 0.00–0.08)

## 2017-09-03 MED ORDER — SODIUM CHLORIDE 0.9 % IV BOLUS (SEPSIS)
500.0000 mL | Freq: Once | INTRAVENOUS | Status: AC
  Administered 2017-09-03: 500 mL via INTRAVENOUS

## 2017-09-03 MED ORDER — IOPAMIDOL (ISOVUE-300) INJECTION 61%
INTRAVENOUS | Status: AC
  Administered 2017-09-03: 100 mL
  Filled 2017-09-03: qty 100

## 2017-09-03 MED ORDER — HYDROMORPHONE HCL 1 MG/ML IJ SOLN
0.5000 mg | Freq: Once | INTRAMUSCULAR | Status: AC
  Administered 2017-09-03: 0.5 mg via INTRAVENOUS
  Filled 2017-09-03: qty 1

## 2017-09-03 MED ORDER — HYDROCODONE-ACETAMINOPHEN 5-325 MG PO TABS
1.0000 | ORAL_TABLET | Freq: Once | ORAL | Status: AC
  Administered 2017-09-03: 1 via ORAL
  Filled 2017-09-03: qty 1

## 2017-09-03 MED ORDER — HYDROCODONE-ACETAMINOPHEN 5-325 MG PO TABS: 1.0000 | ORAL_TABLET | ORAL | 0 refills | Status: DC | PRN
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
## 2017-09-03 NOTE — ED Provider Notes (Signed)
[facility] Provider Note   CSN: [id number] Arrival date & time: 09/03/17  1216     History   Chief Complaint Chief Complaint  Patient presents with  . Abdominal Pain  . Back Pain  . Fall    HPI [name] is a 76 y.o. female.  Patient s/p fall 1 week ago. Was mechanical fall, no loc. Contusion to left flank/struck area on counter. No head injury or loc. No headache. Since fall c/o left flank pain. Constant, dull, moderate, worse w palpation and certain movements. Went to outside ED and had ct chest read as negative for acute injury/fracture.  Pain persists. Is having bms. No vomiting. Is on anticoag, hx afib. Denies gu c/o. No fevers.    The history is provided by the patient.  Abdominal Pain   Pertinent negatives include fever, vomiting, dysuria, hematuria and headaches.  Back Pain   Associated symptoms include abdominal pain. Pertinent negatives include no chest pain, no fever, no headaches and no dysuria.  Fall  Associated symptoms include abdominal pain. Pertinent negatives include no chest pain, no headaches and no shortness of breath.    Past Medical History:  Diagnosis Date  . Atrial fibrillation (HCC)   . Chronic back pain    "mid back down into lower back" (08/25/2014)  . Frequent falls   . GERD (gastroesophageal reflux disease)   . Hypertriglyceridemia   . OSA on CPAP   . Osteoarthritis    "knees, hands, back" (08/25/2014)  . Pneumonia ~ 2000 X 1  . Subdural hematoma (HCC) july 2015   S/P fall while on Coumadin  . T12 compression fracture (HCC) 2012  . Type II diabetes mellitus [facility](HCC)     Patient Active Problem List   Diagnosis Date Noted  . Primary osteoarthritis of left knee 02/09/2017  . SDH (subdural hematoma) (HCC)   . Urinary frequency   . Type 2 diabetes mellitus with peripheral neuropathy (HCC)   . Cough   . Gait disturbance, post-stroke 10/20/2016  . Hemiparesis affecting left side as late effect of  stroke (HCC) 10/20/2016  . Essential hypertension 10/19/2016  . Hyperlipidemia LDL goal <70 10/19/2016  . 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  . Left hemiparesis (HCC)   . Atrial fibrillation (HCC)   . History of fall   . History of traumatic subdural hematoma   . Vascular headache   . Hypokalemia   . Leukocytosis   . Acute blood loss anemia   . Hypoalbuminemia due to protein-calorie malnutrition (HCC)   . Dysphagia, post-stroke   . Acute respiratory failure (HCC)   . Acute embolic stroke (HCC) - R putamen/caudate and R insular infarcts s/p TICI3 revascularization w/ mechanical thrombectomy [facility] 10/15/2016  . Pressure injury of skin 10/15/2016  . HCAP (healthcare-associated pneumonia) 11/09/2014  . Weakness 08/25/2014  . Acute bronchitis 08/25/2014  . UTI (lower urinary tract infection) 08/25/2014  . Fracture of lumbar spine (HCC) 08/25/2014  . Subdural hematoma, post-traumatic (HCC) 05/12/2014  . Chronic atrial fibrillation (HCC) 05/12/2014  . Diabetes mellitus (HCC) 05/12/2014  . OSA on CPAP 05/12/2014    Past Surgical History:  Procedure Laterality Date  . APPENDECTOMY  2012  . CATARACT EXTRACTION W/ INTRAOCULAR LENS  IMPLANT, BILATERAL Bilateral 2000's  . IR GENERIC HISTORICAL  10/15/2016   IR PERCUTANEOUS ART THROMBECTOMY/INFUSION INTRACRANIAL INC DIAG ANGIO 10/15/2016 Lauren CottonSanjeev Deveshwar, MD [facility]-INTERV RAD  . IR GENERIC HISTORICAL  12/13/2016   IR RADIOLOGIST EVAL & MGMT  12/13/2016 [facility]  . RADIOLOGY WITH ANESTHESIA N/A 10/15/2016   Performed by Lauren Cotton, MD at [facility] OR  . TOTAL ABDOMINAL HYSTERECTOMY  1990    OB History    No data available       Home Medications    Prior to Admission medications   Medication Sig Start Date End Date Taking? Authorizing Provider  acetaminophen (TYLENOL) 500 MG tablet Take 1,000 mg by mouth every 12 (twelve) hours as needed.    [provider]  albuterol (ACCUNEB) 0.63 MG/3ML nebulizer  solution Take 0.63 mg by nebulization every 6 (six) hours as needed. When unable to use inaler 07/12/16   [provider]  alendronate (FOSAMAX) 70 MG tablet Take 70 mg by mouth every Monday.  04/30/14   [provider]  butalbital-acetaminophen-caffeine (FIORICET, ESGIC) 50-325-40 MG tablet Take 1 tablet by mouth every 12 (twelve) hours as needed for headache. 11/01/16   Angiulli, Mcarthur Rossetti, PA-C  chlorpheniramine-HYDROcodone (TUSSIONEX PENNKINETIC ER) 10-8 MG/5ML SUER Take 5 mLs by mouth every 12 (twelve) hours as needed for cough.    [provider]  diclofenac sodium (VOLTAREN) 1 % GEL Apply 2 g topically 4 (four) times daily. 11/08/16   Kirsteins, Victorino Sparrow, MD  ELIQUIS 5 MG TABS tablet  11/03/16   [provider]  fluticasone (FLONASE) 50 MCG/ACT nasal spray Place 1 spray into both nostrils daily as needed for allergies.  02/18/14   [provider]  HYDROcodone-acetaminophen (NORCO/VICODIN) 5-325 MG tablet Take 1 tablet by mouth every 12 (twelve) hours as needed for moderate pain.    [provider]  levalbuterol (XOPENEX HFA) 45 MCG/ACT inhaler Inhale 1 puff into the lungs every 4 (four) hours as needed. 04/22/15   [provider]  metFORMIN (GLUCOPHAGE) 500 MG tablet Take 500 mg by mouth 2 (two) times daily. 11/22/16   [provider]  metoprolol tartrate (LOPRESSOR) 25 MG tablet Take 0.5 tablets (12.5 mg total) by mouth 2 (two) times daily. 11/01/16   Angiulli, Mcarthur Rossetti, PA-C  mirabegron ER (MYRBETRIQ) 50 MG TB24 tablet Take 50 mg by mouth. 02/24/17   [provider]  pravastatin (PRAVACHOL) 10 MG tablet Take 1 tablet (10 mg total) by mouth daily. 11/01/16   Angiulli, Mcarthur Rossetti, PA-C  tiZANidine (ZANAFLEX) 4 MG tablet TAKE 1 TABLET(4 MG) BY MOUTH AT BEDTIME 02/09/17   Kirsteins, Victorino Sparrow, MD  tiZANidine (ZANAFLEX) 4 MG tablet TAKE 1 TABLET(4 MG) BY MOUTH AT BEDTIME 07/25/17   Kirsteins, Victorino Sparrow, MD  traMADol (ULTRAM) 50 MG tablet  Take 50 mg by mouth as needed. 11/11/16   [provider]    Family History Family History  Family history unknown: Yes    Social History Social History   Tobacco Use  . Smoking status: Passive Smoke Exposure - Never Smoker  . Smokeless tobacco: Never Used  Substance Use Topics  . Alcohol use: No  . Drug use: No     Allergies   Lactose intolerance (gi); Banana; Peanut-containing drug products; and Chocolate   Review of Systems Review of Systems  Constitutional: Negative for fever.  HENT: Negative for nosebleeds.   Eyes: Negative for redness.  Respiratory: Negative for shortness of breath.   Cardiovascular: Negative for chest pain.  Gastrointestinal: Positive for abdominal pain. Negative for vomiting.  Genitourinary: Negative for dysuria and hematuria.  Musculoskeletal: Positive for back pain. Negative for neck pain.  Skin: Negative for rash.  Neurological: Negative for headaches.  Hematological:  Is on anticoag therapy, bruising noted to left flank  Psychiatric/Behavioral: Negative for confusion.     Physical Exam Updated Vital Signs BP 113/78   Pulse 89   Temp 98.8 F (37.1 C) (Oral)   Resp (!) 21   SpO2 97%   Physical Exam  Constitutional: She appears well-developed and well-nourished. No distress.  HENT:  Head: Atraumatic.  Mouth/Throat: Oropharynx is clear and moist.  Eyes: Conjunctivae are normal. Pupils are equal, round, and reactive to light. No scleral icterus.  Neck: Neck supple. No tracheal deviation present.  Cardiovascular: Normal rate, normal heart sounds and intact distal pulses.  Pulmonary/Chest: Effort normal and breath sounds normal. No respiratory distress.  Abdominal: Soft. Normal appearance and bowel sounds are normal. She exhibits no distension. There is tenderness.  Left abd tenderness.  Genitourinary:  Genitourinary Comments: No cva tenderness  Musculoskeletal: She exhibits no edema.  CTLS spine, non tender, aligned,  no step off. Good rom extremities without pain or focal bony tenderness.   Neurological: She is alert.  Skin: Skin is warm and dry. No rash noted.  Psychiatric: She has a normal mood and affect.  Nursing note and vitals reviewed.    ED Treatments / Results  Labs (all labs ordered are listed, but only abnormal results are displayed) Results for orders placed or performed during the hospital encounter of 09/03/17  Basic metabolic panel  Result Value Ref Range   Sodium 140 135 - 145 mmol/L   Potassium 3.6 3.5 - 5.1 mmol/L   Chloride 107 101 - 111 mmol/L   CO2 22 22 - 32 mmol/L   Glucose, Bld 109 (H) 65 - 99 mg/dL   BUN 9 6 - 20 mg/dL   Creatinine, Ser 2.950.52 0.44 - 1.00 mg/dL   Calcium 9.4 8.9 - 62.110.3 mg/dL   GFR calc non Af Amer >60 >60 mL/min   GFR calc Af Amer >60 >60 mL/min   Anion gap 11 5 - 15  CBC  Result Value Ref Range   WBC 9.6 4.0 - 10.5 K/uL   RBC 4.43 3.87 - 5.11 MIL/uL   Hemoglobin 13.8 12.0 - 15.0 g/dL   HCT 30.841.4 65.736.0 - 84.646.0 %   MCV 93.5 78.0 - 100.0 fL   MCH 31.2 26.0 - 34.0 pg   MCHC 33.3 30.0 - 36.0 g/dL   RDW 96.212.2 95.211.5 - 84.115.5 %   Platelets 311 150 - 400 K/uL  I-stat troponin, ED  Result Value Ref Range   Troponin i, poc 0.00 0.00 - 0.08 ng/mL   Comment 3           Dg Chest 2 View  Result Date: 09/03/2017 CLINICAL DATA:  Rib and abdominal pain.  Flank pain due to fall EXAM: CHEST  2 VIEW COMPARISON:  02/11/2017 FINDINGS: Low volume chest with asymmetric elevation of the left diaphragm. Mild streaky opacity at the bases which is likely atelectasis or scarring. No edema, air bronchogram, effusion, or pneumothorax. Borderline heart size accentuated by technique. Remote L1 and L2 compression fractures. No acute osseous finding. IMPRESSION: Chronic low volume chest with atelectasis or scarring at the bases. No acute superimposed finding. Electronically Signed   By: Marnee SpringJonathon  Watts M.D.   On: 09/03/2017 14:00    EKG  EKG Interpretation  Date/Time:  Sunday  September 03 2017 12:22:05 EST Ventricular Rate:  101 PR Interval:    QRS Duration: 82 QT Interval:  288 QTC Calculation: 373 R Axis:   -10 Text Interpretation:  Atrial fibrillation  with rapid ventricular response Nonspecific T wave abnormality No significant change since last tracing Confirmed by Cathren Laine ([id number]) on 09/03/2017 3:23:20 PM       Radiology Dg Chest 2 View  Result Date: 09/03/2017 CLINICAL DATA:  Rib and abdominal pain.  Flank pain due to fall EXAM: CHEST  2 VIEW COMPARISON:  02/11/2017 FINDINGS: Low volume chest with asymmetric elevation of the left diaphragm. Mild streaky opacity at the bases which is likely atelectasis or scarring. No edema, air bronchogram, effusion, or pneumothorax. Borderline heart size accentuated by technique. Remote L1 and L2 compression fractures. No acute osseous finding. IMPRESSION: Chronic low volume chest with atelectasis or scarring at the bases. No acute superimposed finding. Electronically Signed   By: Marnee Spring M.D.   On: 09/03/2017 14:00   Ct Abdomen Pelvis W Contrast  Result Date: 09/03/2017 CLINICAL DATA:  Fall on November 11th. Today pain has worsened, shortness of breath, diaphoretic. New bruising on left flank area. EXAM: CT ABDOMEN AND PELVIS WITH CONTRAST TECHNIQUE: Multidetector CT imaging of the abdomen and pelvis was performed using the standard protocol following bolus administration of intravenous contrast. CONTRAST:  ISOVUE-300 IOPAMIDOL (ISOVUE-300) INJECTION 61% COMPARISON:  CT abdomen dated 01/18/2008. FINDINGS: Lower chest: Focal dense consolidation within the left lower lobe, atelectasis versus aspiration. Right lung bases clear. Hepatobiliary: No focal liver abnormality is seen. No gallstones, gallbladder wall thickening, or biliary dilatation. Pancreas: Unremarkable. No pancreatic ductal dilatation or surrounding inflammatory changes. Spleen: Normal in size without focal abnormality. Adrenals/Urinary Tract:  Adrenal glands are unremarkable. Stable small left adrenal adenoma. Kidneys appear normal without mass, stone or hydronephrosis. No perinephric fluid or edema. Bladder is unremarkable, partially decompressed. Stomach/Bowel: Bowel is normal in caliber. No bowel wall thickening or evidence of bowel wall inflammation. Appendix is not seen but there are no inflammatory changes about the cecum to suggest acute appendicitis. Stomach is unremarkable. Vascular/Lymphatic: Abdominal aorta appears intact and normal in configuration. No acute appearing vascular abnormality. No enlarged lymph nodes seen in the abdomen or pelvis. Scattered aortic atherosclerosis. Reproductive: Status post hysterectomy. No adnexal masses. Other: No free fluid or abscess collection. No free intraperitoneal air. Musculoskeletal: Slightly displaced fractures of the left posterior-lateral tenth and eleventh ribs. Significantly displaced fracture the posterior left twelfth rib. Chronic compression fracture deformity of the T12 vertebral body, not appreciably changed compared to the earlier plain film of 05/11/2014. IMPRESSION: 1. Left tenth through twelfth rib fractures. These include slightly displaced fractures of the left tenth and eleventh posterior-lateral ribs. More significantly displaced fracture is seen within the posterior left twelfth rib. Associated overlying soft tissue edema. 2. New focal dense consolidation within the left lower lobe, atelectasis versus aspiration. Favor atelectasis given the adjacent rib fractures. 3. No acute intra-abdominal or intrapelvic abnormality. No free fluid or hemorrhage within the abdomen or pelvis. No evidence of solid organ injury. 4. Aortic atherosclerosis. Electronically Signed   By: Bary Richard M.D.   On: 09/03/2017 17:13    Procedures Procedures (including critical care time)  Medications Ordered in ED Medications - No data to display   Initial Impression / Assessment and Plan / ED Course    I have reviewed the triage vital signs and the nursing notes.  Pertinent labs & imaging results that were available during my care of the patient were reviewed by me and considered in my medical decision making (see chart for details).  Iv ns.   Labs sent. Reviewed reports of outside films.   CT imaging ordered.  Reviewed nursing notes and prior charts for additional history.   Dilaudid .5 mg iv for pain.   Pain improved.   1615 ct result pending - signed out to Dr Corlis Leak to f/u on CT result and dispo appropriately.     Final Clinical Impressions(s) / ED Diagnoses   Final diagnoses:  None    ED Discharge Orders    None       Cathren Laine, MD 09/04/17 [phone]

## 2017-09-03 NOTE — Discharge Instructions (Addendum)
It was our pleasure to provide your ER care today - we hope that you feel better.  Take your pain medication as need.   If constipated, take colace (stool softener) and miralax (laxative) as need.   Follow up with primary care doctor in 1 week if symptoms fail to improve/resolve.  Return to ER if worse, new symptoms, intractable pain, fevers, other concern.

## 2017-09-03 NOTE — ED Notes (Signed)
Patient transported to CT 

## 2017-09-03 NOTE — ED Triage Notes (Signed)
Pt fell 08-27-17, [facility] done.  Today pain has worsened, short of breath, diaphoretic.  Daughter noticed new bruising on left flank area.

## 2018-01-27 IMAGING — CR DG CHEST 2V
2 series · 2 of 2 positions shown · non-contrast
Comparison: 02/11/2017

CLINICAL DATA: Rib and abdominal pain.  Flank pain due to fall

EXAM:
CHEST  2 VIEW

[chest lat]
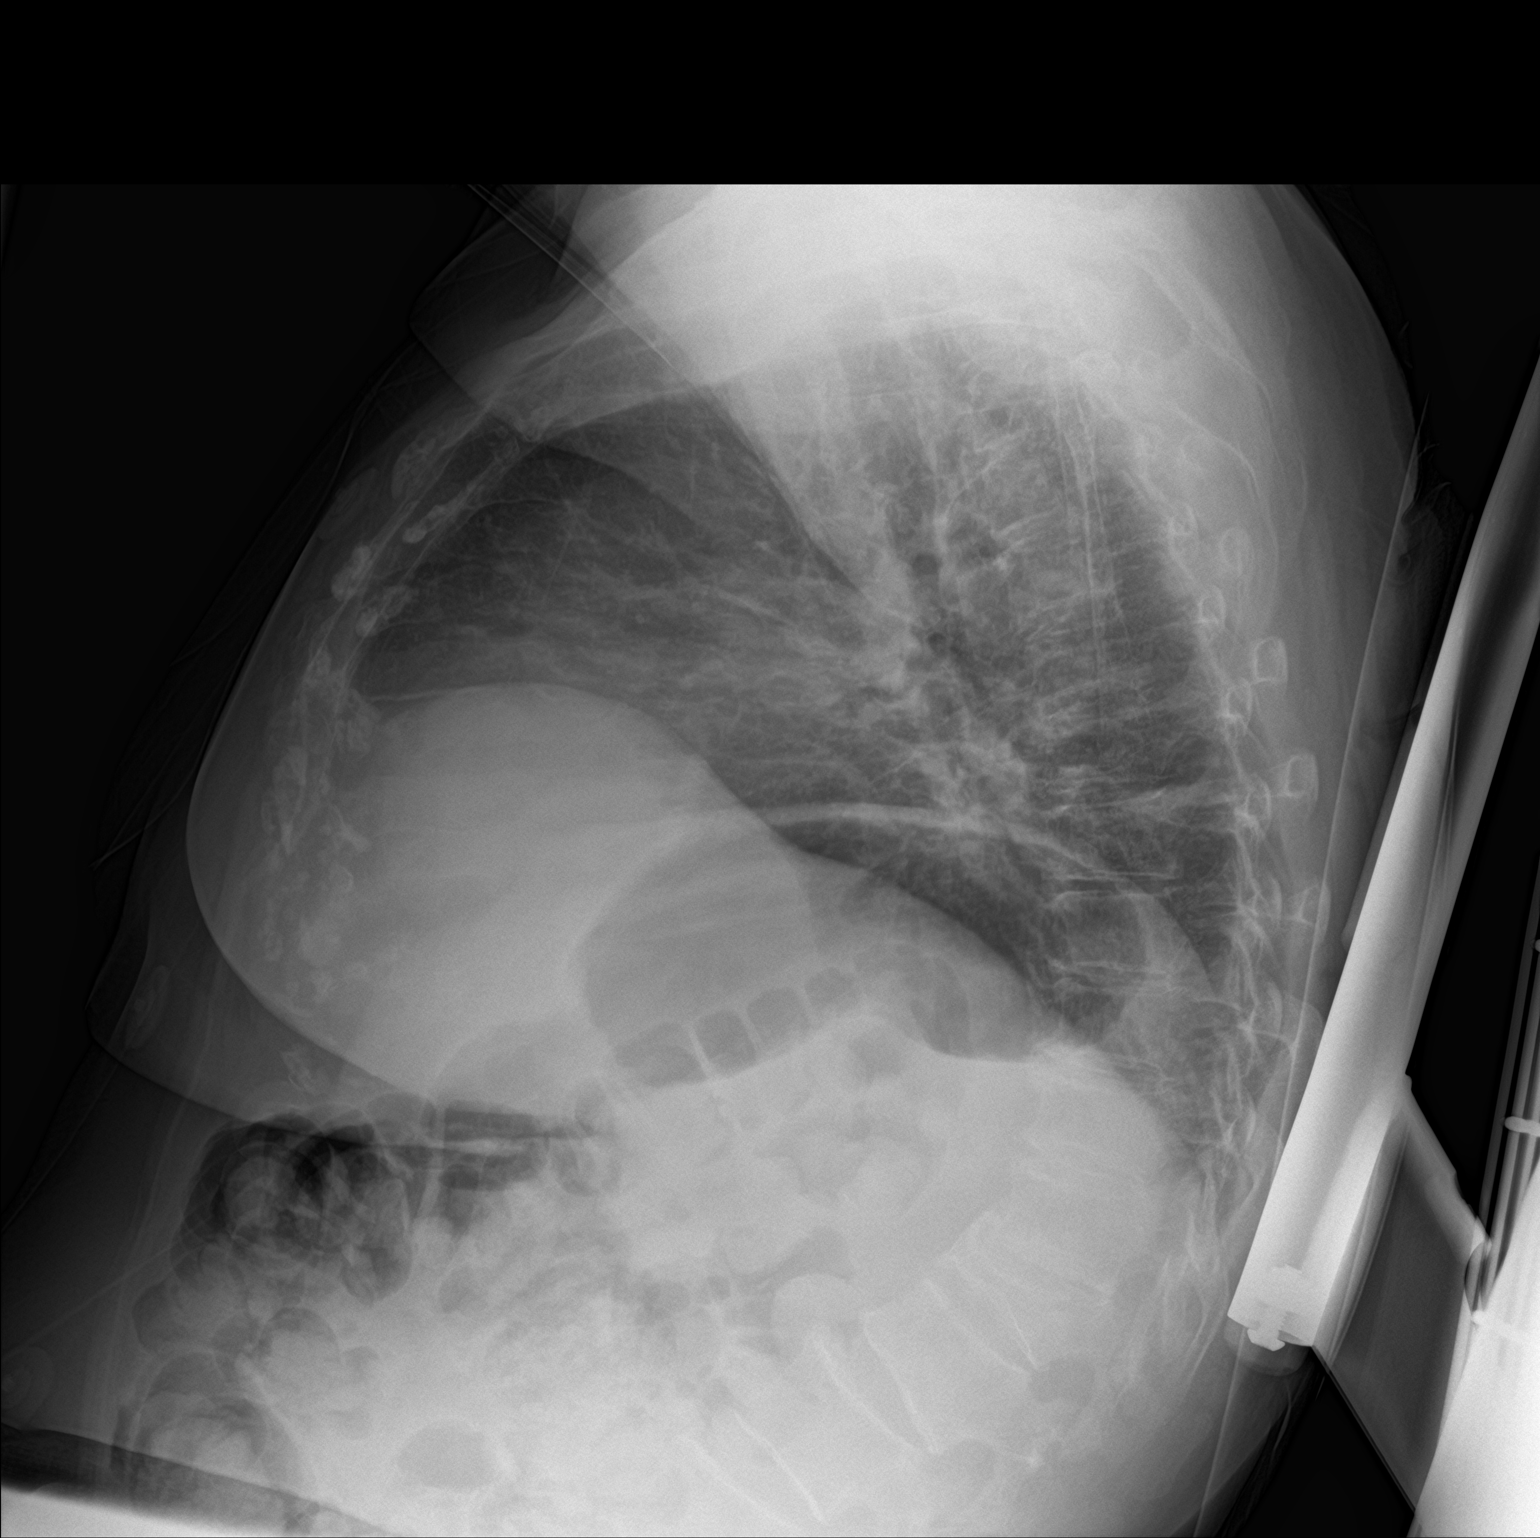

[chest ap]
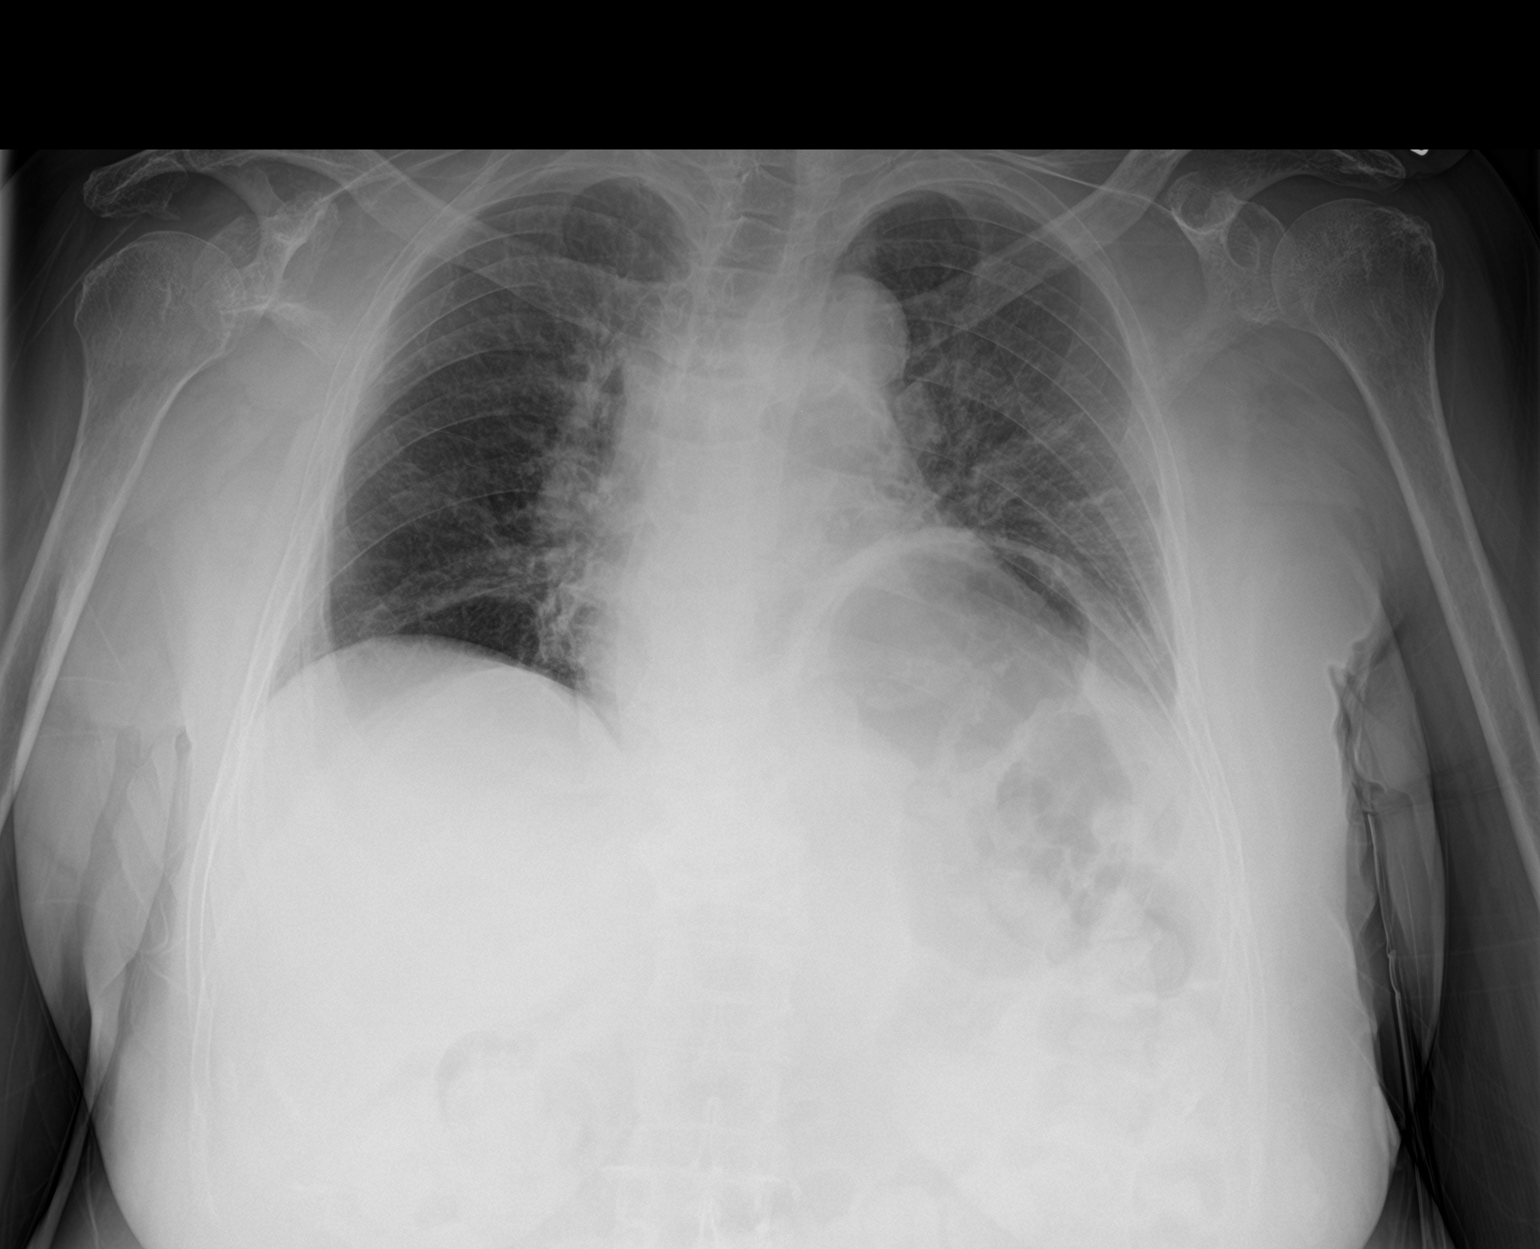

[2 of 2 positions shown; findings below may reference images not displayed]

FINDINGS: Low volume chest with asymmetric elevation of the left diaphragm.
Mild streaky opacity at the bases which is likely atelectasis or
scarring. No edema, air bronchogram, effusion, or pneumothorax.
Borderline heart size accentuated by technique. Remote L1 and L2
compression fractures. No acute osseous finding.
IMPRESSION: Chronic low volume chest with atelectasis or scarring at the bases.
No acute superimposed finding.

## 2018-01-27 IMAGING — CT CT ABD-PELV W/ CM
2 of 5 series · 15 of 46 positions shown, 17 images · IV contrast (APPLIED)
Comparison: CT abdomen dated [DATE].

CLINICAL DATA: Fall on [REDACTED]. Today pain has worsened,
shortness of breath, diaphoretic. New bruising on left flank area.

EXAM:
CT ABDOMEN AND PELVIS WITH CONTRAST
TECHNIQUE: Multidetector CT imaging of the abdomen and pelvis was performed
using the standard protocol following bolus administration of
intravenous contrast.
CONTRAST:  100mL GX3KIZ-FYY IOPAMIDOL (GX3KIZ-FYY) INJECTION 61%

[Series 3: abd/ pelvis 5.0 i30f 2 · axial · 0.95mm/px · z∈[+624,+1044]mm · 12 of 94 slices shown, 14 images]
[im 5/94  soft-tissue]
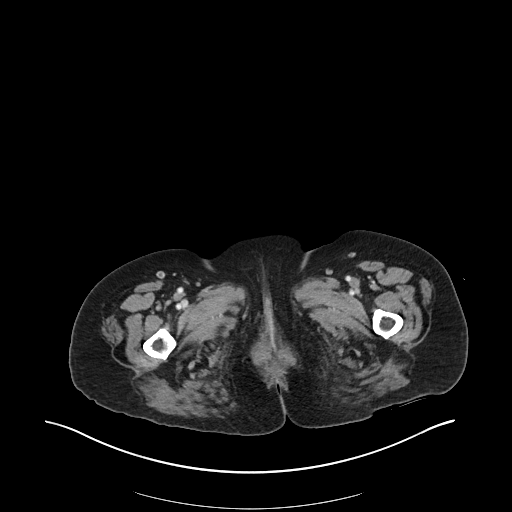
[im 5/94  bone]
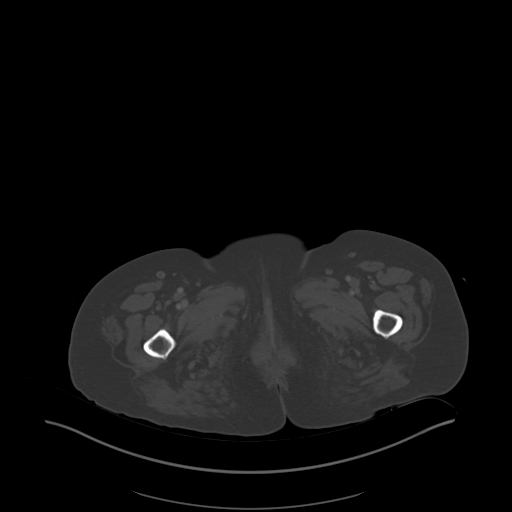
[im 15/94  soft-tissue]
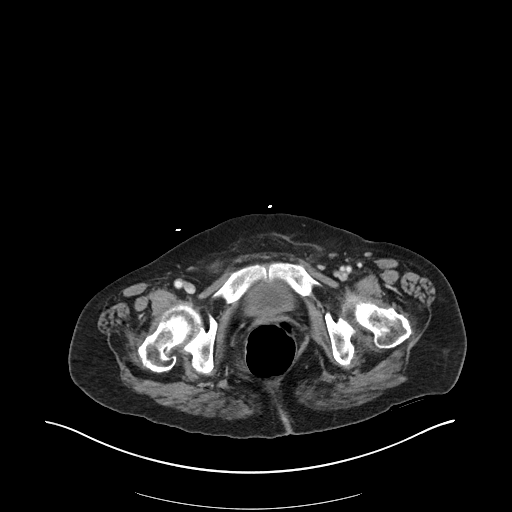
[im 20/94  soft-tissue]
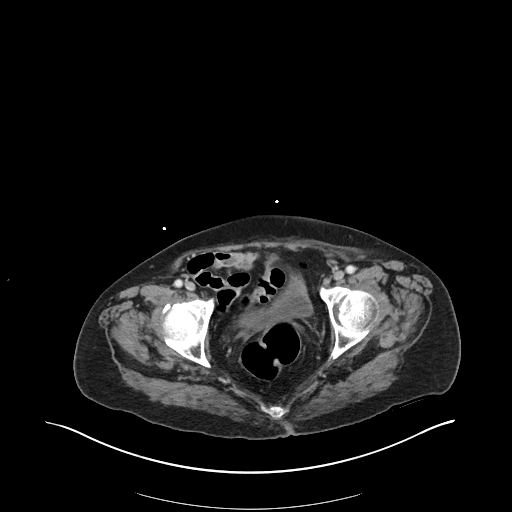
[im 30/94  soft-tissue]
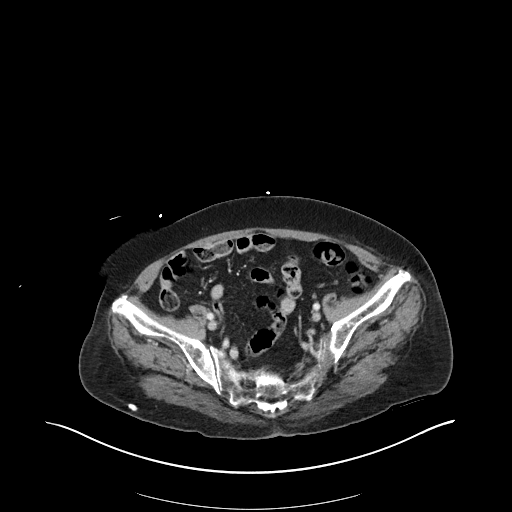
[im 35/94  soft-tissue]
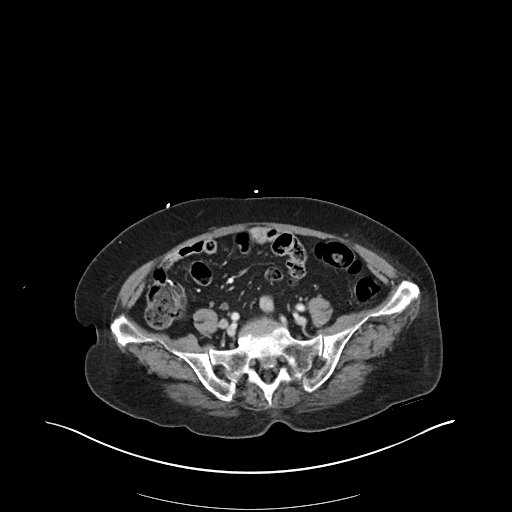
[im 45/94  soft-tissue]
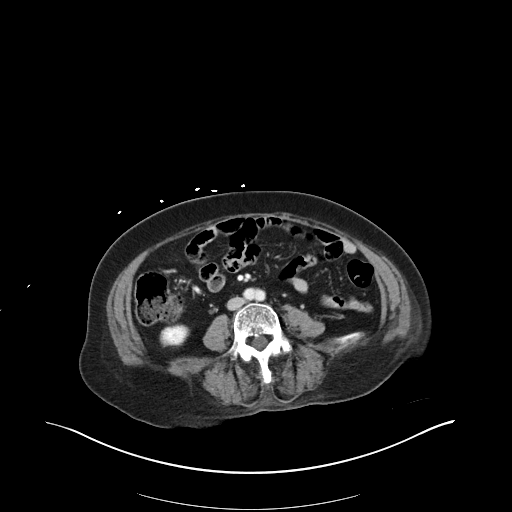
[im 49/94  soft-tissue]
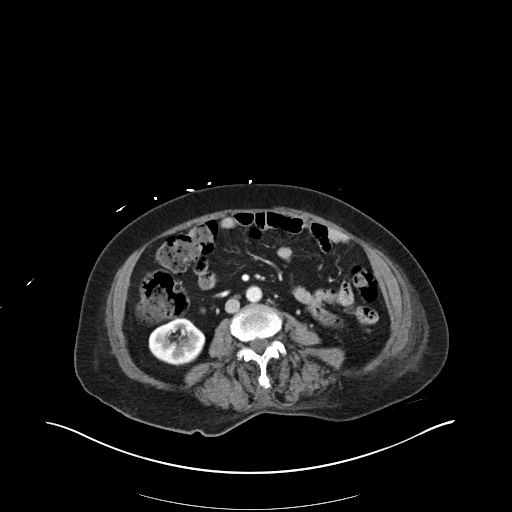
[im 59/94  soft-tissue]
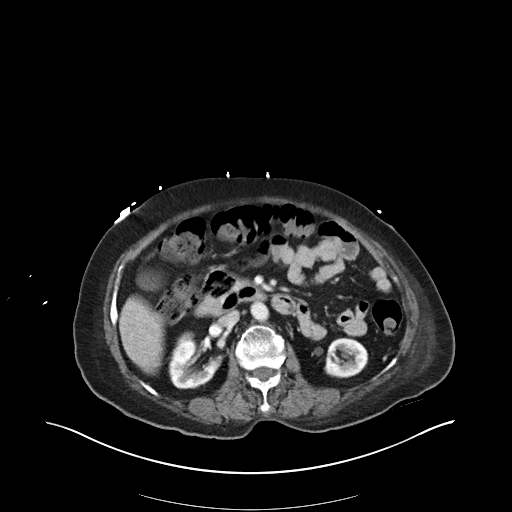
[im 64/94  soft-tissue]
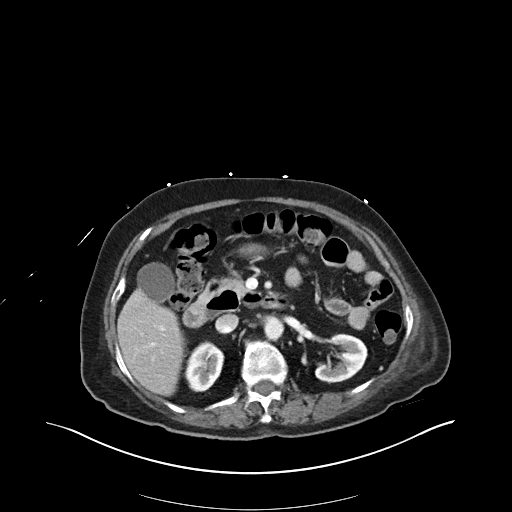
[im 64/94  bone]
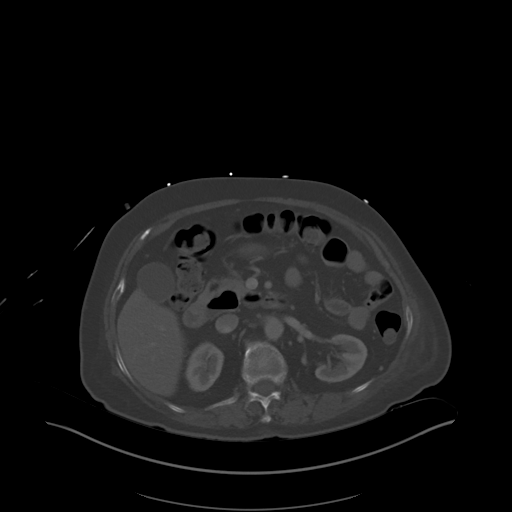
[im 74/94  soft-tissue]
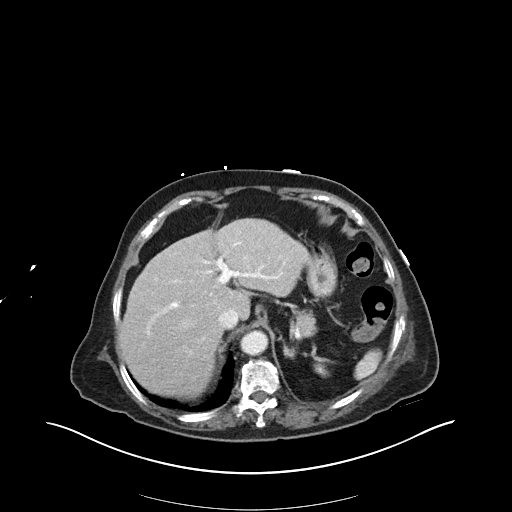
[im 79/94  soft-tissue]
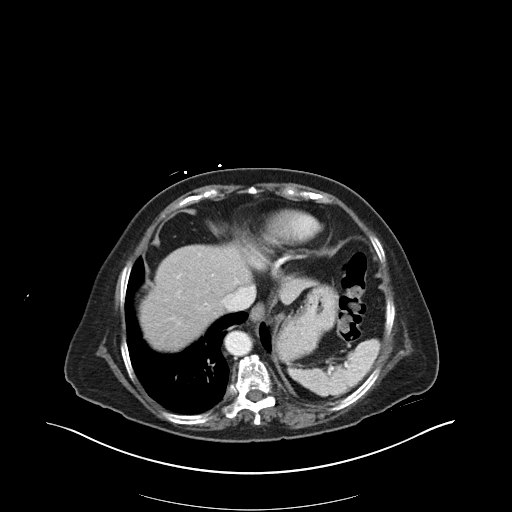
[im 89/94  soft-tissue]
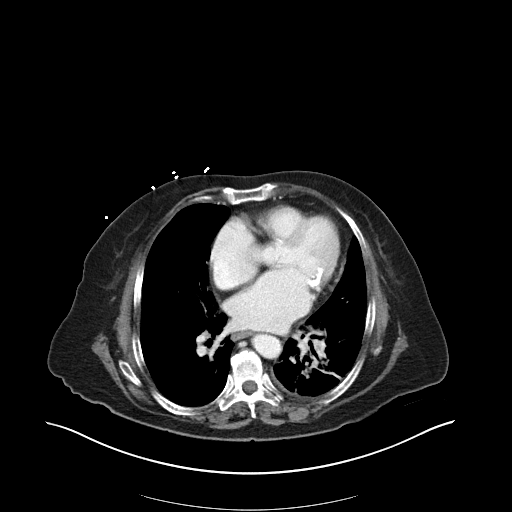

[Series 6: coronal soft tissue · coronal · 0.79mm/px · 3 of 101 slices shown]
[im 34/101  soft-tissue]
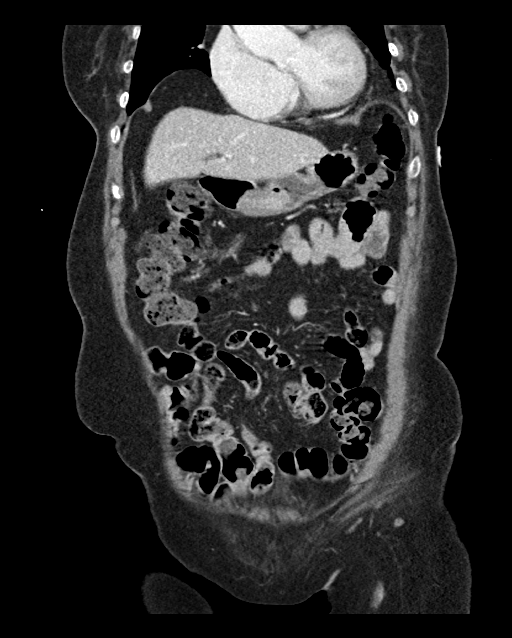
[im 45/101  soft-tissue]
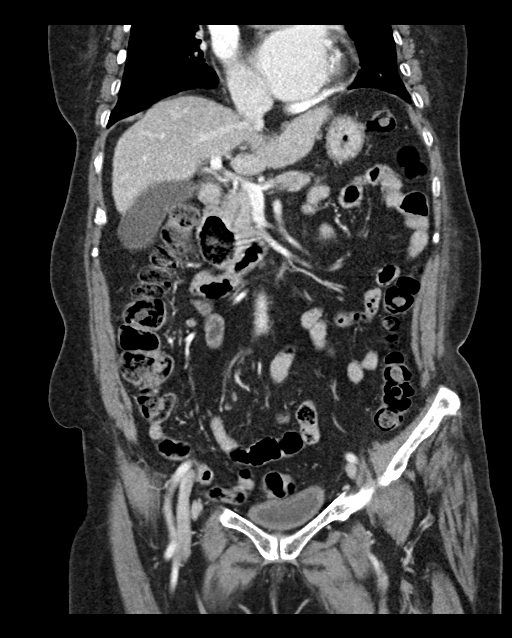
[im 56/101  soft-tissue]
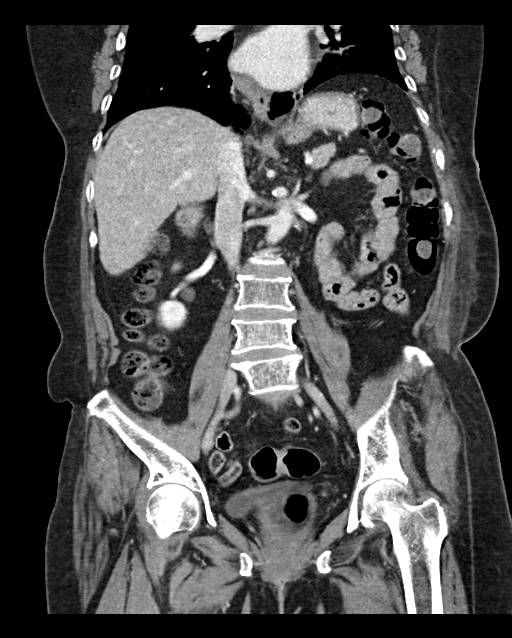

[15 of 46 positions shown; findings below may reference images not displayed]

FINDINGS: Lower chest: Focal dense consolidation within the left lower lobe,
atelectasis versus aspiration. Right lung bases clear.

Hepatobiliary: No focal liver abnormality is seen. No gallstones,
gallbladder wall thickening, or biliary dilatation.

Pancreas: Unremarkable. No pancreatic ductal dilatation or
surrounding inflammatory changes.

Spleen: Normal in size without focal abnormality.

Adrenals/Urinary Tract: Adrenal glands are unremarkable. Stable
small left adrenal adenoma. Kidneys appear normal without mass,
stone or hydronephrosis. No perinephric fluid or edema. Bladder is
unremarkable, partially decompressed.

Stomach/Bowel: Bowel is normal in caliber. No bowel wall thickening
or evidence of bowel wall inflammation. Appendix is not seen but
there are no inflammatory changes about the cecum to suggest acute
appendicitis. Stomach is unremarkable.

Vascular/Lymphatic: Abdominal aorta appears intact and normal in
configuration. No acute appearing vascular abnormality. No enlarged
lymph nodes seen in the abdomen or pelvis. Scattered aortic
atherosclerosis.

Reproductive: Status post hysterectomy. No adnexal masses.

Other: No free fluid or abscess collection. No free intraperitoneal
air.

Musculoskeletal: Slightly displaced fractures of the left
posterior-lateral tenth and eleventh ribs. Significantly displaced
fracture the posterior left twelfth rib.

Chronic compression fracture deformity of the T12 vertebral body,
not appreciably changed compared to the earlier plain film of
01/18/2008.
IMPRESSION: 1. Left tenth through twelfth rib fractures. These include slightly
displaced fractures of the left tenth and eleventh posterior-lateral
ribs. More significantly displaced fracture is seen within the
posterior left twelfth rib. Associated overlying soft tissue edema.
2. New focal dense consolidation within the left lower lobe,
atelectasis versus aspiration. Favor atelectasis given the adjacent
rib fractures.
3. No acute intra-abdominal or intrapelvic abnormality. No free
fluid or hemorrhage within the abdomen or pelvis. No evidence of
solid organ injury.
4. Aortic atherosclerosis.

## 2018-02-11 ENCOUNTER — Other Ambulatory Visit: Payer: Self-pay | Admitting: Physical Medicine & Rehabilitation

## 2018-02-12 ENCOUNTER — Observation Stay (HOSPITAL_COMMUNITY)
Admission: EM | Admit: 2018-02-12 | Discharge: 2018-02-14 | Disposition: A | Discharge status: Home / Self Care | Payer: Medicare Other | Attending: Internal Medicine | Admitting: Internal Medicine

## 2018-02-12 ENCOUNTER — Encounter (HOSPITAL_COMMUNITY): Payer: Self-pay | Admitting: Emergency Medicine

## 2018-02-12 ENCOUNTER — Emergency Department (HOSPITAL_COMMUNITY): Payer: Medicare Other

## 2018-02-12 DIAGNOSIS — Z7901 Long term (current) use of anticoagulants: Secondary | ICD-10-CM | POA: Diagnosis not present

## 2018-02-12 DIAGNOSIS — Z9989 Dependence on other enabling machines and devices: Secondary | ICD-10-CM

## 2018-02-12 DIAGNOSIS — I69398 Other sequelae of cerebral infarction: Secondary | ICD-10-CM | POA: Diagnosis not present

## 2018-02-12 DIAGNOSIS — I482 Chronic atrial fibrillation: Secondary | ICD-10-CM | POA: Diagnosis not present

## 2018-02-12 DIAGNOSIS — Z7951 Long term (current) use of inhaled steroids: Secondary | ICD-10-CM | POA: Insufficient documentation

## 2018-02-12 DIAGNOSIS — E1142 Type 2 diabetes mellitus with diabetic polyneuropathy: Secondary | ICD-10-CM | POA: Diagnosis not present

## 2018-02-12 DIAGNOSIS — Z7722 Contact with and (suspected) exposure to environmental tobacco smoke (acute) (chronic): Secondary | ICD-10-CM | POA: Insufficient documentation

## 2018-02-12 DIAGNOSIS — G8929 Other chronic pain: Secondary | ICD-10-CM | POA: Insufficient documentation

## 2018-02-12 DIAGNOSIS — M549 Dorsalgia, unspecified: Secondary | ICD-10-CM | POA: Insufficient documentation

## 2018-02-12 DIAGNOSIS — R51 Headache: Secondary | ICD-10-CM | POA: Diagnosis not present

## 2018-02-12 DIAGNOSIS — R6510 Systemic inflammatory response syndrome (SIRS) of non-infectious origin without acute organ dysfunction: Secondary | ICD-10-CM | POA: Diagnosis present

## 2018-02-12 DIAGNOSIS — E119 Type 2 diabetes mellitus without complications: Secondary | ICD-10-CM | POA: Insufficient documentation

## 2018-02-12 DIAGNOSIS — Z7984 Long term (current) use of oral hypoglycemic drugs: Secondary | ICD-10-CM | POA: Diagnosis not present

## 2018-02-12 DIAGNOSIS — R269 Unspecified abnormalities of gait and mobility: Secondary | ICD-10-CM | POA: Diagnosis not present

## 2018-02-12 DIAGNOSIS — Z79899 Other long term (current) drug therapy: Secondary | ICD-10-CM | POA: Diagnosis not present

## 2018-02-12 DIAGNOSIS — I110 Hypertensive heart disease with heart failure: Secondary | ICD-10-CM | POA: Insufficient documentation

## 2018-02-12 DIAGNOSIS — J189 Pneumonia, unspecified organism: Principal | ICD-10-CM | POA: Insufficient documentation

## 2018-02-12 DIAGNOSIS — Z79891 Long term (current) use of opiate analgesic: Secondary | ICD-10-CM | POA: Insufficient documentation

## 2018-02-12 DIAGNOSIS — Z7983 Long term (current) use of bisphosphonates: Secondary | ICD-10-CM | POA: Insufficient documentation

## 2018-02-12 DIAGNOSIS — I5032 Chronic diastolic (congestive) heart failure: Secondary | ICD-10-CM | POA: Diagnosis not present

## 2018-02-12 DIAGNOSIS — E781 Pure hyperglyceridemia: Secondary | ICD-10-CM | POA: Insufficient documentation

## 2018-02-12 DIAGNOSIS — I4891 Unspecified atrial fibrillation: Secondary | ICD-10-CM | POA: Diagnosis not present

## 2018-02-12 DIAGNOSIS — I69354 Hemiplegia and hemiparesis following cerebral infarction affecting left non-dominant side: Secondary | ICD-10-CM | POA: Insufficient documentation

## 2018-02-12 DIAGNOSIS — G4733 Obstructive sleep apnea (adult) (pediatric): Secondary | ICD-10-CM | POA: Diagnosis not present

## 2018-02-12 DIAGNOSIS — I959 Hypotension, unspecified: Secondary | ICD-10-CM | POA: Diagnosis not present

## 2018-02-12 DIAGNOSIS — E785 Hyperlipidemia, unspecified: Secondary | ICD-10-CM | POA: Insufficient documentation

## 2018-02-12 DIAGNOSIS — I10 Essential (primary) hypertension: Secondary | ICD-10-CM | POA: Diagnosis present

## 2018-02-12 DIAGNOSIS — I639 Cerebral infarction, unspecified: Secondary | ICD-10-CM | POA: Diagnosis present

## 2018-02-12 DIAGNOSIS — Z9181 History of falling: Secondary | ICD-10-CM | POA: Insufficient documentation

## 2018-02-12 DIAGNOSIS — R509 Fever, unspecified: Secondary | ICD-10-CM

## 2018-02-12 LAB — CBC WITH DIFFERENTIAL/PLATELET
Basophils Absolute: 0 10*3/uL (ref 0.0–0.1)
Basophils Relative: 0 %
Eosinophils Absolute: 0 10*3/uL (ref 0.0–0.7)
Eosinophils Relative: 0 %
HCT: 39.9 % (ref 36.0–46.0)
Hemoglobin: 13.2 g/dL (ref 12.0–15.0)
Lymphocytes Relative: 5 %
Lymphs Abs: 0.8 10*3/uL (ref 0.7–4.0)
MCH: 31.5 pg (ref 26.0–34.0)
MCHC: 33.1 g/dL (ref 30.0–36.0)
MCV: 95.2 fL (ref 78.0–100.0)
Monocytes Absolute: 1.7 10*3/uL — ABNORMAL HIGH (ref 0.1–1.0)
Monocytes Relative: 9 %
Neutro Abs: 15.6 10*3/uL — ABNORMAL HIGH (ref 1.7–7.7)
Neutrophils Relative %: 86 %
Platelets: 322 10*3/uL (ref 150–400)
RBC: 4.19 MIL/uL (ref 3.87–5.11)
RDW: 12.2 % (ref 11.5–15.5)
WBC: 18.2 10*3/uL — ABNORMAL HIGH (ref 4.0–10.5)

## 2018-02-12 LAB — COMPREHENSIVE METABOLIC PANEL
ALT: 12 U/L — ABNORMAL LOW (ref 14–54)
AST: 20 U/L (ref 15–41)
Albumin: 4.2 g/dL (ref 3.5–5.0)
Alkaline Phosphatase: 40 U/L (ref 38–126)
Anion gap: 10 (ref 5–15)
BUN: 13 mg/dL (ref 6–20)
CO2: 23 mmol/L (ref 22–32)
Calcium: 9.3 mg/dL (ref 8.9–10.3)
Chloride: 105 mmol/L (ref 101–111)
Creatinine, Ser: 0.7 mg/dL (ref 0.44–1.00)
GFR calc Af Amer: >60 mL/min (ref >60)
GFR calc non Af Amer: >60 mL/min (ref >60)
Glucose, Bld: 166 mg/dL — ABNORMAL HIGH (ref 65–99)
Potassium: 3.3 mmol/L — ABNORMAL LOW (ref 3.5–5.1)
Sodium: 138 mmol/L (ref 135–145)
Total Bilirubin: 1 mg/dL (ref 0.3–1.2)
Total Protein: 7.1 g/dL (ref 6.5–8.1)

## 2018-02-12 LAB — PROTIME-INR
INR: 1.41
Prothrombin Time: 17.2 seconds — ABNORMAL HIGH (ref 11.4–15.2)

## 2018-02-12 LAB — URINALYSIS, ROUTINE W REFLEX MICROSCOPIC
Bacteria, UA: NONE SEEN
Bilirubin Urine: NEGATIVE
Glucose, UA: 50 mg/dL — AB
Ketones, ur: 20 mg/dL — AB
Leukocytes, UA: NEGATIVE
Nitrite: NEGATIVE
Protein, ur: NEGATIVE mg/dL
Specific Gravity, Urine: 1.014 (ref 1.005–1.030)
pH: 5 (ref 5.0–8.0)

## 2018-02-12 LAB — I-STAT CG4 LACTIC ACID, ED
Lactic Acid, Venous: 1.83 mmol/L (ref 0.5–1.9)
Lactic Acid, Venous: 1.8 mmol/L (ref 0.5–1.9)

## 2018-02-12 MED ORDER — IPRATROPIUM-ALBUTEROL 0.5-2.5 (3) MG/3ML IN SOLN
3.0000 mL | Freq: Once | RESPIRATORY_TRACT | Status: AC
  Administered 2018-02-12: 3 mL via RESPIRATORY_TRACT
  Filled 2018-02-12: qty 3

## 2018-02-12 MED ORDER — POTASSIUM CHLORIDE CRYS ER 20 MEQ PO TBCR
20.0000 meq | EXTENDED_RELEASE_TABLET | Freq: Once | ORAL | Status: AC
  Administered 2018-02-12: 20 meq via ORAL
  Filled 2018-02-12: qty 1

## 2018-02-12 MED ORDER — METOPROLOL TARTRATE 5 MG/5ML IV SOLN
2.5000 mg | Freq: Once | INTRAVENOUS | Status: DC
  Filled 2018-02-12: qty 5

## 2018-02-12 MED ORDER — SODIUM CHLORIDE 0.9 % IV SOLN
500.0000 mg | Freq: Once | INTRAVENOUS | Status: AC
  Administered 2018-02-12: 500 mg via INTRAVENOUS
  Filled 2018-02-12: qty 500

## 2018-02-12 MED ORDER — SODIUM CHLORIDE 0.9 % IV SOLN
2.0000 g | Freq: Once | INTRAVENOUS | Status: AC
  Administered 2018-02-12: 2 g via INTRAVENOUS
  Filled 2018-02-12: qty 20

## 2018-02-12 MED ORDER — SODIUM CHLORIDE 0.9 % IV BOLUS
1000.0000 mL | Freq: Once | INTRAVENOUS | Status: AC
  Administered 2018-02-12: 1000 mL via INTRAVENOUS

## 2018-02-12 MED ORDER — SODIUM CHLORIDE 0.9 % IV SOLN
INTRAVENOUS | Status: DC
  Administered 2018-02-12: 20:00:00 via INTRAVENOUS

## 2018-02-12 MED ORDER — METOPROLOL TARTRATE 12.5 MG HALF TABLET
12.5000 mg | ORAL_TABLET | Freq: Two times a day (BID) | ORAL | Status: DC
  Administered 2018-02-13 – 2018-02-14 (×4): 12.5 mg via ORAL
  Filled 2018-02-12 (×4): qty 1

## 2018-02-12 NOTE — ED Provider Notes (Signed)
[facility] EMERGENCY DEPARTMENT Provider Note   CSN: [id number] Arrival date & time: 02/12/18  1724     History   Chief Complaint Chief Complaint  Patient presents with  . Cough  . Fever    HPI [name] is a 77 y.o. female.  HPI   77 year old female brought in by her daughter for evaluation of cough and generalized weakness.  Symptom onset 2 days ago.  Today she developed a fever to 102 & was increasingly weak.  She is complaining of a mild headache and some generalized body aches.  Daughter providing translation/additional history.  Past Medical History:  Diagnosis Date  . Atrial fibrillation (HCC)   . Chronic back pain    "mid back down into lower back" (08/25/2014)  . Frequent falls   . GERD (gastroesophageal reflux disease)   . Hypertriglyceridemia   . OSA on CPAP   . Osteoarthritis    "knees, hands, back" (08/25/2014)  . Pneumonia ~ 2000 X 1  . Subdural hematoma (HCC) july 2015   S/P fall while on Coumadin  . T12 compression fracture (HCC) 2012  . Type II diabetes mellitus [facility])     Patient Active Problem List   Diagnosis Date Noted  . Primary osteoarthritis of left knee 02/09/2017  . SDH (subdural hematoma) (HCC)   . Urinary frequency   . Type 2 diabetes mellitus with peripheral neuropathy (HCC)   . Cough   . Gait disturbance, post-stroke 10/20/2016  . Hemiparesis affecting left side as late effect of stroke (HCC) 10/20/2016  . Essential hypertension 10/19/2016  . Hyperlipidemia LDL goal <70 10/19/2016  . 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  . Left hemiparesis (HCC)   . Atrial fibrillation (HCC)   . History of fall   . History of traumatic subdural hematoma   . Vascular headache   . Hypokalemia   . Leukocytosis   . Acute blood loss anemia   . Hypoalbuminemia due to protein-calorie malnutrition (HCC)   . Dysphagia, post-stroke   . Acute respiratory failure (HCC)   . Acute embolic stroke (HCC) - R putamen/caudate and R insular  infarcts s/p TICI3 revascularization w/ mechanical thrombectomy d/t AF not on [facility] 10/15/2016  . Pressure injury of skin 10/15/2016  . HCAP (healthcare-associated pneumonia) 11/09/2014  . Weakness 08/25/2014  . Acute bronchitis 08/25/2014  . UTI (lower urinary tract infection) 08/25/2014  . Fracture of lumbar spine (HCC) 08/25/2014  . Subdural hematoma, post-traumatic (HCC) 05/12/2014  . Chronic atrial fibrillation (HCC) 05/12/2014  . Diabetes mellitus (HCC) 05/12/2014  . OSA on CPAP 05/12/2014    Past Surgical History:  Procedure Laterality Date  . APPENDECTOMY  2012  . CATARACT EXTRACTION W/ INTRAOCULAR LENS  IMPLANT, BILATERAL Bilateral 2000's  . IR GENERIC HISTORICAL  10/15/2016   IR PERCUTANEOUS ART THROMBECTOMY/INFUSION INTRACRANIAL INC DIAG ANGIO 10/15/2016 [name], MD [facility]-INTERV RAD  . IR GENERIC HISTORICAL  12/13/2016   IR RADIOLOGIST EVAL & MGMT 12/13/2016 [facility]  . RADIOLOGY WITH ANESTHESIA N/A 10/15/2016   Procedure: RADIOLOGY WITH ANESTHESIA;  Surgeon: [name], MD;  Location: [facility] OR;  Service: Radiology;  Laterality: N/A;  . TOTAL ABDOMINAL HYSTERECTOMY  1990     OB History   None      Home Medications    Prior to Admission medications   Medication Sig Start Date End Date Taking? Authorizing Provider  acetaminophen (TYLENOL) 500 MG tablet Take 1,000 mg every 12 (twelve) hours as needed by mouth for headache (pain).  [provider]  albuterol (PROVENTIL) (2.5 MG/3ML) 0.083% nebulizer solution Take 2.5 mg every 6 (six) hours as needed by nebulization for wheezing or shortness of breath.    [provider]  alendronate (FOSAMAX) 70 MG tablet Take 70 mg by mouth every Monday.  04/30/14   [provider]  apixaban (ELIQUIS) 5 MG TABS tablet Take 5 mg 2 (two) times daily by mouth.    [provider]  butalbital-acetaminophen-caffeine (FIORICET, ESGIC) 50-325-40 MG tablet Take 1 tablet by mouth every 12 (twelve)  hours as needed for headache. Patient taking differently: Take 1 tablet every 12 (twelve) hours as needed by mouth (severe headaches).  11/01/16   Angiulli, Mcarthur Rossetti, PA-C  cetirizine (ZYRTEC) 10 MG tablet Take 10 mg daily as needed by mouth (seasonal allergies).    [provider]  chlorpheniramine-HYDROcodone (TUSSIONEX PENNKINETIC ER) 10-8 MG/5ML SUER Take 5 mLs by mouth every 12 (twelve) hours as needed for cough.    [provider]  diclofenac sodium (VOLTAREN) 1 % GEL Apply 2 g topically 4 (four) times daily. 11/08/16   Kirsteins, Victorino Sparrow, MD  fluticasone (FLONASE) 50 MCG/ACT nasal spray Place 1 spray daily as needed into both nostrils (seasonal allergies).  02/18/14   [provider]  HYDROcodone-acetaminophen (NORCO/VICODIN) 5-325 MG tablet Take 1 tablet by mouth every 12 (twelve) hours as needed for moderate pain.    [provider]  HYDROcodone-acetaminophen (NORCO/VICODIN) 5-325 MG tablet Take 1 tablet every 4 (four) hours as needed by mouth. 09/03/17   Mackuen, Courteney Lyn, MD  levalbuterol (XOPENEX HFA) 45 MCG/ACT inhaler Inhale 1 puff every 4 (four) hours as needed into the lungs for wheezing or shortness of breath.  04/22/15   [provider]  Lidocaine 4 % PTCH Apply 2 patches daily topically.    [provider]  metFORMIN (GLUCOPHAGE) 500 MG tablet Take 500 mg by mouth 2 (two) times daily. 11/22/16   [provider]  metoprolol tartrate (LOPRESSOR) 25 MG tablet Take 0.5 tablets (12.5 mg total) by mouth 2 (two) times daily. 11/01/16   Angiulli, Mcarthur Rossetti, PA-C  mirabegron ER (MYRBETRIQ) 50 MG TB24 tablet Take 50 mg at bedtime by mouth.  02/24/17   [provider]  oxyCODONE-acetaminophen (PERCOCET/ROXICET) 5-325 MG tablet Take 1 tablet every 6 (six) hours as needed by mouth for severe pain.    [provider]  pravastatin (PRAVACHOL) 10 MG tablet Take 1 tablet (10 mg total) by mouth daily. Patient taking  differently: Take 10 mg at bedtime by mouth.  11/01/16   Angiulli, Mcarthur Rossetti, PA-C  senna-docusate (SENNA-S) 8.6-50 MG tablet Take 2 tablets 2 (two) times daily as needed by mouth (while taking narcotic pain medication).    [provider]  tiZANidine (ZANAFLEX) 4 MG tablet TAKE 1 TABLET(4 MG) BY MOUTH AT BEDTIME Patient not taking: Reported on 09/03/2017 02/09/17   Erick Colace, MD  tiZANidine (ZANAFLEX) 4 MG tablet TAKE 1 TABLET(4 MG) BY MOUTH AT BEDTIME 02/12/18   Kirsteins, Victorino Sparrow, MD  traMADol (ULTRAM) 50 MG tablet Take 50 mg every 12 (twelve) hours as needed by mouth (pain).  11/11/16   [provider]    Family History Family History  Family history unknown: Yes    Social History Social History   Tobacco Use  . Smoking status: Passive Smoke Exposure - Never Smoker  . Smokeless tobacco: Never Used  Substance Use Topics  . Alcohol use: No  . Drug use: No  Allergies   Lactose intolerance (gi); Banana; Peanut-containing drug products; and Chocolate   Review of Systems Review of Systems  All systems reviewed and negative, other than as noted in HPI.  Physical Exam Updated Vital Signs BP 102/77   Pulse (!) 133   Temp 98.4 F (36.9 C) (Oral)   Resp 19   SpO2 95%   Physical Exam  Constitutional: She appears well-developed and well-nourished. No distress.  HENT:  Head: Normocephalic and atraumatic.  Eyes: Conjunctivae are normal. Right eye exhibits no discharge. Left eye exhibits no discharge.  Neck: Neck supple.  Cardiovascular: Normal heart sounds. Exam reveals no gallop and no friction rub.  No murmur heard. Tachycardic.  Irregularly irregular.  Pulmonary/Chest: Effort normal and breath sounds normal. No respiratory distress.  Abdominal: Soft. She exhibits no distension. There is no tenderness.  Musculoskeletal: She exhibits no edema or tenderness.  Neurological: She is alert.  Skin: Skin is warm and dry.  Psychiatric: She has a  normal mood and affect. Her behavior is normal. Thought content normal.  Nursing note and vitals reviewed.    ED Treatments / Results  Labs (all labs ordered are listed, but only abnormal results are displayed) Labs Reviewed  COMPREHENSIVE METABOLIC PANEL - Abnormal; Notable for the following components:      Result Value   Potassium 3.3 (*)    Glucose, Bld 166 (*)    ALT 12 (*)    All other components within normal limits  CBC WITH DIFFERENTIAL/PLATELET - Abnormal; Notable for the following components:   WBC 18.2 (*)    Neutro Abs 15.6 (*)    Monocytes Absolute 1.7 (*)    All other components within normal limits  PROTIME-INR - Abnormal; Notable for the following components:   Prothrombin Time 17.2 (*)    All other components within normal limits  URINALYSIS, ROUTINE W REFLEX MICROSCOPIC - Abnormal; Notable for the following components:   Glucose, UA 50 (*)    Hgb urine dipstick SMALL (*)    Ketones, ur 20 (*)    All other components within normal limits  BASIC METABOLIC PANEL - Abnormal; Notable for the following components:   CO2 21 (*)    Glucose, Bld 152 (*)    Calcium 8.2 (*)    All other components within normal limits  TSH - Abnormal; Notable for the following components:   TSH 0.290 (*)    All other components within normal limits  CBC - Abnormal; Notable for the following components:   WBC 17.6 (*)    RBC 3.55 (*)    Hemoglobin 11.1 (*)    HCT 34.0 (*)    All other components within normal limits  CBG MONITORING, ED - Abnormal; Notable for the following components:   Glucose-Capillary 159 (*)    All other components within normal limits  CBG MONITORING, ED - Abnormal; Notable for the following components:   Glucose-Capillary 120 (*)    All other components within normal limits  CULTURE, BLOOD (ROUTINE X 2)  CULTURE, BLOOD (ROUTINE X 2)  CULTURE, EXPECTORATED SPUTUM-ASSESSMENT  GRAM STAIN  INFLUENZA PANEL BY PCR (TYPE A & B)  STREP PNEUMONIAE URINARY  ANTIGEN  TROPONIN I  PROCALCITONIN  LEGIONELLA PNEUMOPHILA SEROGP 1 UR AG  T4, FREE  T3, FREE  I-STAT CG4 LACTIC ACID, ED  I-STAT CG4 LACTIC ACID, ED    EKG EKG Interpretation  Date/Time:  Monday February 12 2018 20:21:17 EDT Ventricular Rate:  144 PR Interval:    QRS  Duration: 80 QT Interval:  297 QTC Calculation: 460 R Axis:   -28 Text Interpretation:  Atrial fibrillation Borderline left axis deviation Repolarization abnormality, prob rate related Baseline wander in lead(s) V4 V5 V6 Confirmed by Raeford Razor [phone]) on 02/13/2018 4:26:12 PM   Radiology Dg Chest 2 View  Result Date: 02/12/2018 CLINICAL DATA:  Shortness of breath.  Left chest pain. EXAM: CHEST - 2 VIEW COMPARISON:  September 03, 2017 FINDINGS: Cardiomegaly. The hila and mediastinum are normal. No pulmonary nodules, masses, focal infiltrates, or overt edema. There is a compression fracture of an upper lumbar vertebral body which is unchanged. No other acute abnormalities. IMPRESSION: No acute abnormality. Electronically Signed   By: Gerome Sam III M.D   On: 02/12/2018 18:38    Procedures Procedures (including critical care time)  Medications Ordered in ED Medications  0.9 %  sodium chloride infusion ( Intravenous New Bag/Given 02/12/18 2016)  cefTRIAXone (ROCEPHIN) 2 g in sodium chloride 0.9 % 100 mL IVPB (2 g Intravenous New Bag/Given 02/12/18 2016)  azithromycin (ZITHROMAX) 500 mg in sodium chloride 0.9 % 250 mL IVPB (500 mg Intravenous New Bag/Given 02/12/18 2016)  potassium chloride SA (K-DUR,KLOR-CON) CR tablet 20 mEq (20 mEq Oral Given 02/12/18 2014)     Initial Impression / Assessment and Plan / ED Course  I have reviewed the triage vital signs and the nursing notes.  Pertinent labs & imaging results that were available during my care of the patient were reviewed by me and considered in my medical decision making (see chart for details).     77 year old female with fever and cough.  Empirically  treated for possible community acquired pneumonia.  Afib with rates up to 140. Hx of the same. Was going to order some metoprolol for rate control but deferred when BP dropped a little. Responded to IVF though. Will defer to admitting team.   Final Clinical Impressions(s) / ED Diagnoses   Final diagnoses:  Febrile illness  Atrial fibrillation with rapid ventricular response [facility])    ED Discharge Orders    None       Raeford Razor, MD 02/15/18 2211

## 2018-02-12 NOTE — ED Notes (Signed)
EDP aware of pt BP 80's systolic, order placed for 1L NS

## 2018-02-12 NOTE — ED Notes (Signed)
Pt's daughter stated, she did have a fever of 102.4 , and she had passed out in the floor.

## 2018-02-12 NOTE — ED Triage Notes (Signed)
Patient to ED with daughter for c/o cough, generalized weakness, congestion x 3 days and new onset fever 102.41F today. Patient given 1,000mg  Tylenol PTA (1700) with some relief. Patient also c/o dizziness with standing. A&O x 4.

## 2018-02-13 ENCOUNTER — Other Ambulatory Visit: Payer: Self-pay

## 2018-02-13 ENCOUNTER — Observation Stay (HOSPITAL_COMMUNITY): Payer: Medicare Other

## 2018-02-13 ENCOUNTER — Encounter (HOSPITAL_COMMUNITY): Payer: Self-pay | Admitting: Internal Medicine

## 2018-02-13 DIAGNOSIS — I4891 Unspecified atrial fibrillation: Secondary | ICD-10-CM | POA: Diagnosis not present

## 2018-02-13 DIAGNOSIS — J189 Pneumonia, unspecified organism: Secondary | ICD-10-CM | POA: Diagnosis not present

## 2018-02-13 DIAGNOSIS — R6510 Systemic inflammatory response syndrome (SIRS) of non-infectious origin without acute organ dysfunction: Secondary | ICD-10-CM

## 2018-02-13 LAB — TSH: TSH: 0.29 u[IU]/mL — ABNORMAL LOW (ref 0.350–4.500)

## 2018-02-13 LAB — CBC
HEMATOCRIT: 34 % — AB (ref 36.0–46.0)
HEMOGLOBIN: 11.1 g/dL — AB (ref 12.0–15.0)
MCH: 31.3 pg (ref 26.0–34.0)
MCHC: 32.6 g/dL (ref 30.0–36.0)
MCV: 95.8 fL (ref 78.0–100.0)
Platelets: 279 10*3/uL (ref 150–400)
RBC: 3.55 MIL/uL — ABNORMAL LOW (ref 3.87–5.11)
RDW: 12.3 % (ref 11.5–15.5)
WBC: 17.6 10*3/uL — AB (ref 4.0–10.5)

## 2018-02-13 LAB — BASIC METABOLIC PANEL
ANION GAP: 12 (ref 5–15)
BUN: 12 mg/dL (ref 6–20)
CALCIUM: 8.2 mg/dL — AB (ref 8.9–10.3)
CO2: 21 mmol/L — AB (ref 22–32)
Chloride: 108 mmol/L (ref 101–111)
Creatinine, Ser: 0.7 mg/dL (ref 0.44–1.00)
Glucose, Bld: 152 mg/dL — ABNORMAL HIGH (ref 65–99)
POTASSIUM: 4.3 mmol/L (ref 3.5–5.1)
Sodium: 141 mmol/L (ref 135–145)

## 2018-02-13 LAB — CBG MONITORING, ED
GLUCOSE-CAPILLARY: 159 mg/dL — AB (ref 65–99)
GLUCOSE-CAPILLARY: 120 mg/dL — AB (ref 65–99)
GLUCOSE-CAPILLARY: 106 mg/dL — AB (ref 65–99)

## 2018-02-13 LAB — INFLUENZA PANEL BY PCR (TYPE A & B)
Influenza A By PCR: NEGATIVE
Influenza B By PCR: NEGATIVE

## 2018-02-13 LAB — PROCALCITONIN: PROCALCITONIN: 6.19 ng/mL

## 2018-02-13 LAB — TROPONIN I

## 2018-02-13 LAB — GLUCOSE, CAPILLARY: Glucose-Capillary: 115 mg/dL — ABNORMAL HIGH (ref 65–99)

## 2018-02-13 LAB — STREP PNEUMONIAE URINARY ANTIGEN: Strep Pneumo Urinary Antigen: NEGATIVE

## 2018-02-13 MED ORDER — FLUTICASONE PROPIONATE 50 MCG/ACT NA SUSP
1.0000 | Freq: Every day | NASAL | Status: DC | PRN
Start: 2018-02-13 — End: 2018-02-14

## 2018-02-13 MED ORDER — HYDROCODONE-ACETAMINOPHEN 5-325 MG PO TABS: 1.0000 | ORAL_TABLET | Freq: Two times a day (BID) | ORAL | Status: DC | PRN

## 2018-02-13 MED ORDER — ACETAMINOPHEN 325 MG PO TABS
650.0000 mg | ORAL_TABLET | Freq: Four times a day (QID) | ORAL | Status: DC | PRN
  Administered 2018-02-13 – 2018-02-14 (×2): 650 mg via ORAL
  Filled 2018-02-13 (×2): qty 2

## 2018-02-13 MED ORDER — DICLOFENAC SODIUM 1 % TD GEL: 2.0000 g | Freq: Four times a day (QID) | TRANSDERMAL | Status: DC | PRN

## 2018-02-13 MED ORDER — SODIUM CHLORIDE 0.9 % IV SOLN
500.0000 mg | INTRAVENOUS | Status: DC
  Administered 2018-02-13: 500 mg via INTRAVENOUS
  Filled 2018-02-13 (×2): qty 500

## 2018-02-13 MED ORDER — TIZANIDINE HCL 4 MG PO TABS
4.0000 mg | ORAL_TABLET | Freq: Three times a day (TID) | ORAL | Status: DC | PRN
  Filled 2018-02-13: qty 1

## 2018-02-13 MED ORDER — ONDANSETRON HCL 4 MG/2ML IJ SOLN: 4.0000 mg | Freq: Four times a day (QID) | INTRAMUSCULAR | Status: DC | PRN

## 2018-02-13 MED ORDER — LOPERAMIDE HCL 2 MG PO CAPS
2.0000 mg | ORAL_CAPSULE | ORAL | Status: DC | PRN
  Administered 2018-02-13: 2 mg via ORAL
  Filled 2018-02-13 (×2): qty 1

## 2018-02-13 MED ORDER — PRAVASTATIN SODIUM 20 MG PO TABS
10.0000 mg | ORAL_TABLET | Freq: Every day | ORAL | Status: DC
  Administered 2018-02-13 (×2): 10 mg via ORAL
  Filled 2018-02-13 (×3): qty 1

## 2018-02-13 MED ORDER — SENNOSIDES-DOCUSATE SODIUM 8.6-50 MG PO TABS: 2.0000 | ORAL_TABLET | Freq: Two times a day (BID) | ORAL | Status: DC | PRN

## 2018-02-13 MED ORDER — APIXABAN 5 MG PO TABS
5.0000 mg | ORAL_TABLET | Freq: Two times a day (BID) | ORAL | Status: DC
  Administered 2018-02-13 – 2018-02-14 (×4): 5 mg via ORAL
  Filled 2018-02-13 (×6): qty 1

## 2018-02-13 MED ORDER — SODIUM CHLORIDE 0.9 % IV SOLN
1.0000 g | INTRAVENOUS | Status: DC
  Administered 2018-02-13: 1 g via INTRAVENOUS
  Filled 2018-02-13: qty 10

## 2018-02-13 MED ORDER — MIRABEGRON ER 25 MG PO TB24
50.0000 mg | ORAL_TABLET | Freq: Every day | ORAL | Status: DC
  Administered 2018-02-13 (×2): 50 mg via ORAL
  Filled 2018-02-13 (×2): qty 1
  Filled 2018-02-13: qty 2

## 2018-02-13 MED ORDER — ONDANSETRON HCL 4 MG PO TABS: 4.0000 mg | ORAL_TABLET | Freq: Four times a day (QID) | ORAL | Status: DC | PRN

## 2018-02-13 MED ORDER — INSULIN ASPART 100 UNIT/ML ~~LOC~~ SOLN
0.0000 [IU] | Freq: Three times a day (TID) | SUBCUTANEOUS | Status: DC
  Administered 2018-02-14: 2 [IU] via SUBCUTANEOUS

## 2018-02-13 MED ORDER — ACETAMINOPHEN 650 MG RE SUPP: 650.0000 mg | Freq: Four times a day (QID) | RECTAL | Status: DC | PRN

## 2018-02-13 MED ORDER — TRAMADOL HCL 50 MG PO TABS
50.0000 mg | ORAL_TABLET | Freq: Two times a day (BID) | ORAL | Status: DC | PRN
  Administered 2018-02-13: 50 mg via ORAL
  Filled 2018-02-13: qty 1

## 2018-02-13 NOTE — Progress Notes (Addendum)
2000 Report received from [facility] ED overflow RN. RN to [facility] to pick up patient.  2030 Pt arrived to room 2W28 via wheelchair. Skin checked with charge RN, French Ana. Pt assessed, daughter and son at bedside. Updated with POC. Tele monitor applied and VSS. NAD, Fall precautions in place, [facility].   2130 Pt medicated per MAR, pt c/o leg pain, medicated for pain. No complaints at this time.

## 2018-02-13 NOTE — ED Notes (Signed)
Paged Dr. Benjamine Mola for something for diarrhea per pt's request, states she has had 3 loose stools.

## 2018-02-13 NOTE — Progress Notes (Signed)
Patient admitted after midnight, please see H&P.  Hope for d/c tomm if improved. Ambulate  Lauren Canary DO

## 2018-02-13 NOTE — ED Notes (Signed)
Assisted pt to the bathroom. Pt will pull call light, when finished.

## 2018-02-13 NOTE — ED Notes (Signed)
ORDERED A HEART HEALTHY BREAKFAST TRAY--[name]  

## 2018-02-13 NOTE — ED Notes (Signed)
Per daughters request brought pt washclothes and basin

## 2018-02-13 NOTE — ED Notes (Signed)
Pt ambulated to the bathroom without difficulty, x1 assist

## 2018-02-13 NOTE — ED Notes (Signed)
Pt ambulated to bathroom x1 assist.  

## 2018-02-13 NOTE — H&P (Signed)
History and Physical    [facility] ZOX:[id number] DOB: 1941-07-26 DOA: 02/12/2018  PCP: [name], MD  Patient coming from: Home.  Chief Complaint: Fever fall and cough.  HPI: [name] is a 77 y.o. female with history of diabetes mellitus type 2, atrial fibrillation, diastolic dysfunction, hyperlipidemia was brought to the ER after patient was having persistent cough and found to have running a fever yesterday.  As per the patient's daughter patient has been having upper respiratory tract symptoms with cough over the last 3 to 4 days.  Also has been having some headache in the upper part of the head.  Patient remained afebrile at that time.  But yesterday afternoon patient became febrile and also been trying to walk out of the bed had a fall but did not hit her head.  As per the daughter data: Temperature 102 F at home.  ED Course: In the ER patient was initially hypotensive improved with IV fluids.  Blood work show leukocytosis lactate was normal.  Chest x-ray and UA unremarkable.  Since patient has been having productive cough patient was started on empiric antibiotics for possible developing sepsis from pneumonia.  Blood cultures were obtained.  Patient also was in A. fib with RVR initially and was given IV metoprolol.  Since patient was complaining of persistent headache also had a fall and is on apixaban CT head is ordered.  Review of Systems: As per HPI, rest all negative.   Past Medical History:  Diagnosis Date  . Atrial fibrillation (HCC)   . Chronic back pain    "mid back down into lower back" (08/25/2014)  . Frequent falls   . GERD (gastroesophageal reflux disease)   . Hypertriglyceridemia   . OSA on CPAP   . Osteoarthritis    "knees, hands, back" (08/25/2014)  . Pneumonia ~ 2000 X 1  . Subdural hematoma (HCC) july 2015   S/P fall while on Coumadin  . T12 compression fracture (HCC) 2012  . Type II diabetes mellitus (HCC)     Past Surgical History:  Procedure  Laterality Date  . APPENDECTOMY  2012  . CATARACT EXTRACTION W/ INTRAOCULAR LENS  IMPLANT, BILATERAL Bilateral 2000's  . IR GENERIC HISTORICAL  10/15/2016   IR PERCUTANEOUS ART THROMBECTOMY/INFUSION INTRACRANIAL INC DIAG ANGIO 10/15/2016 [name], MD [facility]  . IR GENERIC HISTORICAL  12/13/2016   IR RADIOLOGIST EVAL & MGMT 12/13/2016 [facility]  . RADIOLOGY WITH ANESTHESIA N/A 10/15/2016   Procedure: RADIOLOGY WITH ANESTHESIA;  Surgeon: [name], MD;  Location: [facility] OR;  Service: Radiology;  Laterality: N/A;  . TOTAL ABDOMINAL HYSTERECTOMY  1990     reports that she is a non-smoker but has been exposed to tobacco smoke. She has never used smokeless tobacco. She reports that she does not drink alcohol or use drugs.  Allergies  Allergen Reactions  . Lactose Intolerance (Gi) Diarrhea  . Banana Swelling    Hands and feet  . Peanut-Containing Drug Products Itching    Throat itches, no swelling  . Chocolate Itching    Family History  Problem Relation Age of Onset  . Diabetes Mellitus II Neg Hx   . Hypertension Neg Hx     Prior to Admission medications   Medication Sig Start Date End Date Taking? Authorizing Provider  acetaminophen (TYLENOL) 500 MG tablet Take 1,000 mg every 12 (twelve) hours as needed by mouth for headache (pain).    Yes [provider]  albuterol (PROVENTIL) (2.5 MG/3ML) 0.083% nebulizer solution  Take 2.5 mg every 6 (six) hours as needed by nebulization for wheezing or shortness of breath.   Yes [provider]  alendronate (FOSAMAX) 70 MG tablet Take 70 mg by mouth every Monday.  04/30/14  Yes [provider]  apixaban (ELIQUIS) 5 MG TABS tablet Take 5 mg 2 (two) times daily by mouth.   Yes [provider]  butalbital-acetaminophen-caffeine (FIORICET, ESGIC) 50-325-40 MG tablet Take 1 tablet by mouth every 12 (twelve) hours as needed for headache. Patient taking differently: Take 1 tablet every 12 (twelve)  hours as needed by mouth (severe headaches).  11/01/16  Yes Angiulli, Mcarthur Rossetti, PA-C  cetirizine (ZYRTEC) 10 MG tablet Take 10 mg daily as needed by mouth (seasonal allergies).   Yes [provider]  chlorpheniramine-HYDROcodone (TUSSIONEX PENNKINETIC ER) 10-8 MG/5ML SUER Take 5 mLs by mouth every 12 (twelve) hours as needed for cough.   Yes [provider]  diclofenac sodium (VOLTAREN) 1 % GEL Apply 2 g topically 4 (four) times daily. Patient taking differently: Apply 2 g topically 4 (four) times daily as needed (PAIN).  11/08/16  Yes Kirsteins, Victorino Sparrow, MD  fluticasone (FLONASE) 50 MCG/ACT nasal spray Place 1 spray daily as needed into both nostrils (seasonal allergies).  02/18/14  Yes [provider]  HYDROcodone-acetaminophen (NORCO/VICODIN) 5-325 MG tablet Take 1 tablet by mouth every 12 (twelve) hours as needed for moderate pain.   Yes [provider]  levalbuterol (XOPENEX HFA) 45 MCG/ACT inhaler Inhale 1 puff every 4 (four) hours as needed into the lungs for wheezing or shortness of breath.  04/22/15  Yes [provider]  Lidocaine 4 % PTCH Apply 2 patches daily topically.   Yes [provider]  metFORMIN (GLUCOPHAGE) 500 MG tablet Take 500 mg by mouth 2 (two) times daily. 11/22/16  Yes [provider]  metoprolol tartrate (LOPRESSOR) 25 MG tablet Take 0.5 tablets (12.5 mg total) by mouth 2 (two) times daily. 11/01/16  Yes Angiulli, Mcarthur Rossetti, PA-C  mirabegron ER (MYRBETRIQ) 50 MG TB24 tablet Take 50 mg at bedtime by mouth.  02/24/17  Yes [provider]  oxyCODONE-acetaminophen (PERCOCET/ROXICET) 5-325 MG tablet Take 1 tablet every 6 (six) hours as needed by mouth for severe pain.   Yes [provider]  pravastatin (PRAVACHOL) 10 MG tablet Take 1 tablet (10 mg total) by mouth daily. Patient taking differently: Take 10 mg at bedtime by mouth.  11/01/16  Yes Angiulli, Mcarthur Rossetti, PA-C  senna-docusate (SENNA-S) 8.6-50 MG tablet  Take 2 tablets 2 (two) times daily as needed by mouth (while taking narcotic pain medication).   Yes [provider]  tiZANidine (ZANAFLEX) 4 MG tablet TAKE 1 TABLET(4 MG) BY MOUTH AT BEDTIME Patient taking differently: TAKE 1 TABLET(4 MG) BY MOUTH AT BEDTIME AS NEEDED FOR SPASM 02/12/18  Yes Kirsteins, Victorino Sparrow, MD  traMADol (ULTRAM) 50 MG tablet Take 50 mg every 12 (twelve) hours as needed by mouth (pain).  11/11/16  Yes [provider]    Physical Exam: Vitals:   02/12/18 2230 02/12/18 2300 02/12/18 2330 02/13/18 0115  BP: 111/71 102/66 101/71 95/69  Pulse: (!) 111 (!) 116 (!) 126 (!) 110  Resp: 20 (!) 21 (!) 21 (!) 23  Temp:      TempSrc:      SpO2: 95% 99% 95% 97%      Constitutional: Moderately built and nourished woman Vitals:   02/12/18 2230 02/12/18 2300 02/12/18 2330 02/13/18 0115  BP: 111/71 102/66 101/71 95/69  Pulse: (!) 111 (!) 116 (!) 126 (!) 110  Resp: 20 (!) 21 (!) 21 (!) 23  Temp:      TempSrc:      SpO2: 95% 99% 95% 97%   Eyes: Anicteric no pallor. ENMT: No discharge from the ears eyes nose or mouth. Neck: No mass felt.  No neck rigidity.  No JVD appreciated. Respiratory: No rhonchi or crepitations. Cardiovascular: S1-S2 heard tachycardic. Abdomen: Soft nontender bowel sounds present. Musculoskeletal: No edema.  No joint effusion. Skin: No rash.  Skin appears warm. Neurologic: Alert awake oriented to time place and person.  Moves all extremities 5 x 5. Psychiatric: Appears normal.  Normal affect.   Labs on Admission: I have personally reviewed following labs and imaging studies  CBC: Recent Labs  Lab 02/12/18 1807  WBC 18.2*  NEUTROABS 15.6*  HGB 13.2  HCT 39.9  MCV 95.2  PLT 322   Basic Metabolic Panel: Recent Labs  Lab 02/12/18 1807  NA 138  K 3.3*  CL 105  CO2 23  GLUCOSE 166*  BUN 13  CREATININE 0.70  CALCIUM 9.3   GFR: CrCl cannot be calculated (Unknown ideal weight.). Liver Function Tests: Recent Labs  Lab  02/12/18 1807  AST 20  ALT 12*  ALKPHOS 40  BILITOT 1.0  PROT 7.1  ALBUMIN 4.2   No results for input(s): LIPASE, AMYLASE in the last 168 hours. No results for input(s): AMMONIA in the last 168 hours. Coagulation Profile: Recent Labs  Lab 02/12/18 1807  INR 1.41   Cardiac Enzymes: No results for input(s): CKTOTAL, CKMB, CKMBINDEX, TROPONINI in the last 168 hours. BNP (last 3 results) No results for input(s): PROBNP in the last 8760 hours. HbA1C: No results for input(s): HGBA1C in the last 72 hours. CBG: No results for input(s): GLUCAP in the last 168 hours. Lipid Profile: No results for input(s): CHOL, HDL, LDLCALC, TRIG, CHOLHDL, LDLDIRECT in the last 72 hours. Thyroid Function Tests: No results for input(s): TSH, T4TOTAL, FREET4, T3FREE, THYROIDAB in the last 72 hours. Anemia Panel: No results for input(s): VITAMINB12, FOLATE, FERRITIN, TIBC, IRON, RETICCTPCT in the last 72 hours. Urine analysis:    Component Value Date/Time   COLORURINE YELLOW 02/12/2018 2250   APPEARANCEUR CLEAR 02/12/2018 2250   LABSPEC 1.014 02/12/2018 2250   PHURINE 5.0 02/12/2018 2250   GLUCOSEU 50 (A) 02/12/2018 2250   HGBUR SMALL (A) 02/12/2018 2250   BILIRUBINUR NEGATIVE 02/12/2018 2250   KETONESUR 20 (A) 02/12/2018 2250   PROTEINUR NEGATIVE 02/12/2018 2250   UROBILINOGEN 0.2 02/11/2017 1601   NITRITE NEGATIVE 02/12/2018 2250   LEUKOCYTESUR NEGATIVE 02/12/2018 2250   Sepsis Labs: (procalcitonin:4,lacticidven:4) )No results found for this or any previous visit (from the past 240 hour(s)).   Radiological Exams on Admission: Dg Chest 2 View  Result Date: 02/12/2018 CLINICAL DATA:  Shortness of breath.  Left chest pain. EXAM: CHEST - 2 VIEW COMPARISON:  September 03, 2017 FINDINGS: Cardiomegaly. The hila and mediastinum are normal. No pulmonary nodules, masses, focal infiltrates, or overt edema. There is a compression fracture of an upper lumbar vertebral body which is unchanged.  No other acute abnormalities. IMPRESSION: No acute abnormality. Electronically Signed   By: Gerome Sam III M.D   On: 02/12/2018 18:38    EKG: Independently reviewed.  A. fib with RVR.  Assessment/Plan Principal Problem:   SIRS (systemic inflammatory response syndrome) (HCC) Active Problems:   OSA on CPAP   Essential hypertension   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HCC)   Type 2  diabetes mellitus with peripheral neuropathy (HCC)   Hypotension   Atrial fibrillation with RVR (HCC)    1. SIRS with possible developing sepsis source could be respiratory tract -patient is placed on ceftriaxone and Zithromax for possible developing pneumonia.  Will follow blood cultures sputum cultures influenza PCR urine for Legionella strep antigen and urine cultures.  Follow lactate levels and pro calcitonin levels.  Since patient has history of diastolic CHF cautiously hydrate if and as needed. 2. A. fib with RVR improved with IV metoprolol and also have given 1 dose of oral metoprolol home dose.  Check TSH cardiac markers.  Patient is on apixaban and metoprolol. 3. History of sleep apnea does not tolerate CPAP. 4. Diabetes mellitus type 2 -we will keep patient on sliding scale coverage. 5. Previous history of stroke.  On statins and apixaban. 6. Headache and also had a fall -we will check CT head.  Once patient is more stable will need physical therapy.   DVT prophylaxis: Apixaban. Code Status: Full code. Family Communication: Patient's daughter. Disposition Plan: Home. Consults called: None. Admission status: Observation.   Eduard Clos MD [facility] Pager [phone].  If 7PM-7AM, please contact night-coverage [email or web address] Password [email or web address]  02/13/2018, 1:32 AM

## 2018-02-14 DIAGNOSIS — R509 Fever, unspecified: Secondary | ICD-10-CM | POA: Diagnosis not present

## 2018-02-14 DIAGNOSIS — J189 Pneumonia, unspecified organism: Secondary | ICD-10-CM | POA: Diagnosis not present

## 2018-02-14 DIAGNOSIS — I10 Essential (primary) hypertension: Secondary | ICD-10-CM | POA: Diagnosis not present

## 2018-02-14 DIAGNOSIS — I4891 Unspecified atrial fibrillation: Secondary | ICD-10-CM | POA: Diagnosis not present

## 2018-02-14 LAB — CBC
HEMATOCRIT: 34 % — AB (ref 36.0–46.0)
HEMOGLOBIN: 10.9 g/dL — AB (ref 12.0–15.0)
MCH: 30.9 pg (ref 26.0–34.0)
MCHC: 32.1 g/dL (ref 30.0–36.0)
MCV: 96.3 fL (ref 78.0–100.0)
Platelets: 285 10*3/uL (ref 150–400)
RBC: 3.53 MIL/uL — ABNORMAL LOW (ref 3.87–5.11)
RDW: 12.5 % (ref 11.5–15.5)
WBC: 11.8 10*3/uL — ABNORMAL HIGH (ref 4.0–10.5)

## 2018-02-14 LAB — BASIC METABOLIC PANEL
Anion gap: 10 (ref 5–15)
BUN: 7 mg/dL (ref 6–20)
CALCIUM: 8.7 mg/dL — AB (ref 8.9–10.3)
CHLORIDE: 105 mmol/L (ref 101–111)
CO2: 25 mmol/L (ref 22–32)
CREATININE: 0.54 mg/dL (ref 0.44–1.00)
GFR calc Af Amer: >60 mL/min (ref >60)
GFR calc non Af Amer: >60 mL/min (ref >60)
Glucose, Bld: 133 mg/dL — ABNORMAL HIGH (ref 65–99)
Potassium: 3.9 mmol/L (ref 3.5–5.1)
Sodium: 140 mmol/L (ref 135–145)

## 2018-02-14 LAB — LEGIONELLA PNEUMOPHILA SEROGP 1 UR AG: L. PNEUMOPHILA SEROGP 1 UR AG: NEGATIVE

## 2018-02-14 LAB — T4, FREE: Free T4: 1.05 ng/dL (ref 0.82–1.77)

## 2018-02-14 LAB — GLUCOSE, CAPILLARY: GLUCOSE-CAPILLARY: 153 mg/dL — AB (ref 65–99)

## 2018-02-14 MED ORDER — METFORMIN HCL 500 MG PO TABS
500.0000 mg | ORAL_TABLET | Freq: Two times a day (BID) | ORAL | Status: DC
  Administered 2018-02-14: 500 mg via ORAL
  Filled 2018-02-14: qty 1

## 2018-02-14 MED ORDER — DOXYCYCLINE HYCLATE 100 MG PO TABS
100.0000 mg | ORAL_TABLET | Freq: Two times a day (BID) | ORAL | Status: DC
  Administered 2018-02-14: 100 mg via ORAL
  Filled 2018-02-14: qty 1

## 2018-02-14 MED ORDER — LEVALBUTEROL HCL 0.63 MG/3ML IN NEBU: 0.6300 mg | INHALATION_SOLUTION | Freq: Four times a day (QID) | RESPIRATORY_TRACT | Status: DC | PRN

## 2018-02-14 MED ORDER — ALBUTEROL SULFATE (2.5 MG/3ML) 0.083% IN NEBU
2.5000 mg | INHALATION_SOLUTION | RESPIRATORY_TRACT | Status: DC | PRN
  Administered 2018-02-14: 2.5 mg via RESPIRATORY_TRACT
  Filled 2018-02-14: qty 3

## 2018-02-14 MED ORDER — LEVALBUTEROL HCL 0.63 MG/3ML IN NEBU: 0.6300 mg | INHALATION_SOLUTION | Freq: Four times a day (QID) | RESPIRATORY_TRACT | 12 refills | Status: DC | PRN

## 2018-02-14 MED ORDER — DOXYCYCLINE HYCLATE 100 MG PO TABS: 100.0000 mg | ORAL_TABLET | Freq: Two times a day (BID) | ORAL | 0 refills | Status: DC

## 2018-02-14 NOTE — Plan of Care (Signed)
  Problem: Education: Goal: Knowledge of General Education information will improve Outcome: Progressing   Problem: Health Behavior/Discharge Planning: Goal: Ability to manage health-related needs will improve Outcome: Progressing   Problem: Clinical Measurements: Goal: Ability to maintain clinical measurements within normal limits will improve Outcome: Progressing Goal: Will remain free from infection Outcome: Progressing Goal: Diagnostic test results will improve Outcome: Progressing Goal: Respiratory complications will improve Outcome: Progressing Goal: Cardiovascular complication will be avoided Outcome: Progressing   Problem: Clinical Measurements: Goal: Ability to maintain clinical measurements within normal limits will improve Outcome: Progressing Goal: Will remain free from infection Outcome: Progressing Goal: Diagnostic test results will improve Outcome: Progressing Goal: Respiratory complications will improve Outcome: Progressing Goal: Cardiovascular complication will be avoided Outcome: Progressing   Problem: Clinical Measurements: Goal: Will remain free from infection Outcome: Progressing   Problem: Clinical Measurements: Goal: Diagnostic test results will improve Outcome: Progressing   Problem: Clinical Measurements: Goal: Respiratory complications will improve Outcome: Progressing   Problem: Clinical Measurements: Goal: Cardiovascular complication will be avoided Outcome: Progressing   Problem: Clinical Measurements: Goal: Will remain free from infection Outcome: Progressing   Problem: Clinical Measurements: Goal: Diagnostic test results will improve Outcome: Progressing   Problem: Clinical Measurements: Goal: Respiratory complications will improve Outcome: Progressing   Problem: Clinical Measurements: Goal: Cardiovascular complication will be avoided Outcome: Progressing   Problem: Activity: Goal: Risk for activity intolerance will  decrease Outcome: Progressing   Problem: Coping: Goal: Level of anxiety will decrease Outcome: Progressing   Problem: Elimination: Goal: Will not experience complications related to bowel motility Outcome: Progressing   Problem: Nutrition: Goal: Adequate nutrition will be maintained Outcome: Progressing   Problem: Activity: Goal: Risk for activity intolerance will decrease Outcome: Progressing   Problem: Activity: Goal: Risk for activity intolerance will decrease Outcome: Progressing

## 2018-02-14 NOTE — Discharge Instructions (Signed)
Your WBC count is much improved.  I have changed your antibiotics to by mouth to finish the course.  While on antibiotic I would recommend taking either an OTC probiotic or yogurt with active cultures (such as Winona Legato)

## 2018-02-14 NOTE — Progress Notes (Signed)
[name] notified of patient wheezing. Will continue to monitor patient. Patient's oxygen sats 94% on RA.

## 2018-02-14 NOTE — Discharge Summary (Signed)
Physician Discharge Summary  [facility] ZOX:[id number] DOB: January 17, 1941 DOA: 02/12/2018  PCP: [name], MD  Admit date: 02/12/2018 Discharge date: 02/14/2018   Recommendations for Outpatient Follow-Up:   PRN nebs -ensure resolution of presumed PNA -follow TSH  Discharge Diagnosis:   Principal Problem:   SIRS (systemic inflammatory response syndrome) (HCC) Active Problems:   OSA on CPAP   Essential hypertension   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HCC)   Type 2 diabetes mellitus with peripheral neuropathy (HCC)   Hypotension   Atrial fibrillation with RVR [facility])   Discharge disposition:  Home:  Discharge Condition: Improved.  Diet recommendation: Low sodium, heart healthy.  Carbohydrate-modified.  Wound care: None.   History of Present Illness:   [name] is a 77 y.o. female with history of diabetes mellitus type 2, atrial fibrillation, diastolic dysfunction, hyperlipidemia was brought to the ER after patient was having persistent cough and found to have running a fever yesterday.  As per the patient's daughter patient has been having upper respiratory tract symptoms with cough over the last 3 to 4 days.  Also has been having some headache in the upper part of the head.  Patient remained afebrile at that time.  But yesterday afternoon patient became febrile and also been trying to walk out of the bed had a fall but did not hit her head.  As per the daughter data: Temperature 102 F at home.      Hospital Course by Problem:    pneumonia- community acquired -WBC decreased -responded well to IV abx-- change to PO and follow up outpatient  A. fib with RVR improved with IV metoprolol and also have given 1 dose of oral metoprolol home dose. -TSH low but free t4 normal -suspect from albuterol neb-- attempted prior auth of xopenex but was denied by insurance  History of sleep apnea does not tolerate CPAP.  Diabetes mellitus type 2 - no immediate complications -carb mod  diet  Previous history of stroke.  On statins and apixaban.  Headache and also had a fall  -CT scan normal -encourage activity       Medical Consultants:    None.   Discharge Exam:   Vitals:   02/14/18 0000 02/14/18 0132  BP: 129/68   Pulse: 95 (!) 103  Resp: (!) 26 (!) 22  Temp: 98.8 F (37.1 C)   SpO2: 96% 100%   Vitals:   02/13/18 2048 02/14/18 0000 02/14/18 0132 02/14/18 0500  BP: 131/75 129/68    Pulse: (!) 107 95 (!) 103   Resp: (!) 29 (!) 26 (!) 22   Temp: 98.3 F (36.8 C) 98.8 F (37.1 C)    TempSrc: Oral Oral    SpO2: 96% 96% 100%   Weight: 63.3 kg (139 lb 8.8 oz)   63.3 kg (139 lb 8.8 oz)  Height:  (1.422 m)       Gen:  NAD-- much improved-- asking about going home as we do not provide a good vegetarian diet   The results of significant diagnostics from this hospitalization (including imaging, microbiology, ancillary and laboratory) are listed below for reference.     Procedures and Diagnostic Studies:   Dg Chest 2 View  Result Date: 02/12/2018 CLINICAL DATA:  Shortness of breath.  Left chest pain. EXAM: CHEST - 2 VIEW COMPARISON:  September 03, 2017 FINDINGS: Cardiomegaly. The hila and mediastinum are normal. No pulmonary nodules, masses, focal infiltrates, or overt edema. There is a compression fracture of an upper lumbar vertebral body  which is unchanged. No other acute abnormalities. IMPRESSION: No acute abnormality. Electronically Signed   By: Gerome Sam III M.D   On: 02/12/2018 18:38   Ct Head Wo Contrast  Result Date: 02/13/2018 CLINICAL DATA:  Cough, generalized weakness new onset fever, dizziness EXAM: CT HEAD WITHOUT CONTRAST TECHNIQUE: Contiguous axial images were obtained from the base of the skull through the vertex without intravenous contrast. COMPARISON:  CT brain 10/28/2016, MRI brain 10/16/2016 FINDINGS: Brain: No acute territorial infarction, hemorrhage or intracranial mass. Mild small vessel ischemic changes of the  white matter. Interim finding of encephalomalacia in the right basal ganglia and white matter consistent with infarct, new since 10/28/2016 but old in appearance. Mild ex vacuo dilatation of the right lateral ventricle. Moderate atrophy. Vascular: No hyperdense vessels.  Carotid vascular calcification Skull: No fracture Sinuses/Orbits: Mucosal thickening in the maxillary, ethmoid and frontal sinuses. No acute orbital abnormality. Other: None IMPRESSION: 1. No CT evidence for acute intracranial abnormality. 2. Atrophy with small vessel ischemic changes of the white matter. Interim finding of encephalomalacia in the right basal ganglia and white matter consistent with remote infarct but new since 10/28/2016. Electronically Signed   By: Jasmine Pang M.D.   On: 02/13/2018 01:44     Labs:   Basic Metabolic Panel: Recent Labs  Lab 02/12/18 1807 02/13/18 0132 02/14/18 0407  NA 138 141 140  K 3.3* 4.3 3.9  CL 105 108 105  CO2 23 21* 25  GLUCOSE 166* 152* 133*  BUN CREATININE 0.70 0.70 0.54  CALCIUM 9.3 8.2* 8.7*   GFR Estimated Creatinine Clearance: 44.5 mL/min (by C-G formula based on SCr of 0.54 mg/dL). Liver Function Tests: Recent Labs  Lab 02/12/18 1807  AST 20  ALT 12*  ALKPHOS 40  BILITOT 1.0  PROT 7.1  ALBUMIN 4.2   No results for input(s): LIPASE, AMYLASE in the last 168 hours. No results for input(s): AMMONIA in the last 168 hours. Coagulation profile Recent Labs  Lab 02/12/18 1807  INR 1.41    CBC: Recent Labs  Lab 02/12/18 1807 02/13/18 0428 02/14/18 0407  WBC 18.2* 17.6* 11.8*  NEUTROABS 15.6*  --   --   HGB 13.2 11.1* 10.9*  HCT 39.9 34.0* 34.0*  MCV 95.2 95.8 96.3  PLT 322 279 285   Cardiac Enzymes: Recent Labs  Lab 02/13/18 0132  TROPONINI <0.03   BNP: Invalid input(s): POCBNP CBG: Recent Labs  Lab 02/13/18 0949 02/13/18 1213 02/13/18 1711 02/13/18 2200  GLUCAP 159* 120* 106* 115*   D-Dimer No results for input(s): DDIMER in  the last 72 hours. Hgb A1c No results for input(s): HGBA1C in the last 72 hours. Lipid Profile No results for input(s): CHOL, HDL, LDLCALC, TRIG, CHOLHDL, LDLDIRECT in the last 72 hours. Thyroid function studies Recent Labs    02/13/18 0131  TSH 0.290*   Anemia work up No results for input(s): VITAMINB12, FOLATE, FERRITIN, TIBC, IRON, RETICCTPCT in the last 72 hours. Microbiology Recent Results (from the past 240 hour(s))  Culture, blood (Routine x 2)     Status: None (Preliminary result)   Collection Time: 02/12/18  6:07 PM  Result Value Ref Range Status   Specimen Description BLOOD BLOOD LEFT FOREARM  Final   Special Requests   Final    BOTTLES DRAWN AEROBIC AND ANAEROBIC Blood Culture adequate volume   Culture   Final    NO GROWTH < 12 HOURS Performed at [facility], [address]  [place], [place] [address]    Report Status PENDING  Incomplete  Culture, blood (Routine x 2)     Status: None (Preliminary result)   Collection Time: 02/12/18  6:18 PM  Result Value Ref Range Status   Specimen Description BLOOD RIGHT ANTECUBITAL  Final   Special Requests   Final    BOTTLES DRAWN AEROBIC AND ANAEROBIC Blood Culture adequate volume   Culture   Final    NO GROWTH < 12 HOURS Performed at [facility], [address] [place], [place] [address]    Report Status PENDING  Incomplete     Discharge Instructions:   Discharge Instructions    Diet - low sodium heart healthy   Complete by:  As directed    Diet Carb Modified   Complete by:  As directed    Increase activity slowly   Complete by:  As directed      Allergies as of 02/14/2018      Reactions   Lactose Intolerance (gi) Diarrhea   Banana Swelling   Hands and feet   Peanut-containing Drug Products Itching   Throat itches, no swelling   Chocolate Itching      Medication List    STOP taking these medications   albuterol (2.5 MG/3ML) 0.083% nebulizer solution Commonly known as:  PROVENTIL    oxyCODONE-acetaminophen 5-325 MG tablet Commonly known as:  PERCOCET/ROXICET     TAKE these medications   acetaminophen 500 MG tablet Commonly known as:  TYLENOL Take 1,000 mg every 12 (twelve) hours as needed by mouth for headache (pain).   alendronate 70 MG tablet Commonly known as:  FOSAMAX Take 70 mg by mouth every Monday.   butalbital-acetaminophen-caffeine 50-325-40 MG tablet Commonly known as:  FIORICET, ESGIC Take 1 tablet by mouth every 12 (twelve) hours as needed for headache. What changed:  reasons to take this   cetirizine 10 MG tablet Commonly known as:  ZYRTEC Take 10 mg daily as needed by mouth (seasonal allergies).   diclofenac sodium 1 % Gel Commonly known as:  VOLTAREN Apply 2 g topically 4 (four) times daily. What changed:    when to take this  reasons to take this   doxycycline 100 MG tablet Commonly known as:  VIBRA-TABS Take 1 tablet (100 mg total) by mouth every 12 (twelve) hours.   ELIQUIS 5 MG Tabs tablet Generic drug:  apixaban Take 5 mg 2 (two) times daily by mouth.   fluticasone 50 MCG/ACT nasal spray Commonly known as:  FLONASE Place 1 spray daily as needed into both nostrils (seasonal allergies).   HYDROcodone-acetaminophen 5-325 MG tablet Commonly known as:  NORCO/VICODIN Take 1 tablet by mouth every 12 (twelve) hours as needed for moderate pain.   levalbuterol 0.63 MG/3ML nebulizer solution Commonly known as:  XOPENEX Take 3 mLs (0.63 mg total) by nebulization every 6 (six) hours as needed for wheezing or shortness of breath.   levalbuterol 45 MCG/ACT inhaler Commonly known as:  XOPENEX HFA Inhale 1 puff every 4 (four) hours as needed into the lungs for wheezing or shortness of breath.   Lidocaine 4 % Ptch Apply 2 patches daily topically.   metFORMIN 500 MG tablet Commonly known as:  GLUCOPHAGE Take 500 mg by mouth 2 (two) times daily.   metoprolol tartrate 25 MG tablet Commonly known as:  LOPRESSOR Take 0.5 tablets  (12.5 mg total) by mouth 2 (two) times daily.   mirabegron ER 50 MG Tb24 tablet Commonly known as:  MYRBETRIQ Take 50  mg at bedtime by mouth.   pravastatin 10 MG tablet Commonly known as:  PRAVACHOL Take 1 tablet (10 mg total) by mouth daily. What changed:  when to take this   SENNA-S 8.6-50 MG tablet Generic drug:  senna-docusate Take 2 tablets 2 (two) times daily as needed by mouth (while taking narcotic pain medication).   tiZANidine 4 MG tablet Commonly known as:  ZANAFLEX TAKE 1 TABLET(4 MG) BY MOUTH AT BEDTIME What changed:  See the new instructions.   traMADol 50 MG tablet Commonly known as:  ULTRAM Take 50 mg every 12 (twelve) hours as needed by mouth (pain).   TUSSIONEX PENNKINETIC ER 10-8 MG/5ML Suer Generic drug:  chlorpheniramine-HYDROcodone Take 5 mLs by mouth every 12 (twelve) hours as needed for cough.            Durable Medical Equipment  (From admission, onward)        Start     Ordered   02/14/18 0816  For home use only DME Nebulizer machine  Once    Comments:  Patient may have one at home but translator I pad died  Question:  Patient needs a nebulizer to treat with the following condition  Answer:  Pneumonia   02/14/18 0816     Follow-up Information    [name], MD Follow up in 1 week(s).   Specialty:  Internal Medicine Contact information: [id number] [name] [facility] [address] [phone]            Time coordinating discharge: 35 min  Signed:  Joseph Art   [facility] 02/14/2018, 8:16 AM

## 2018-02-14 NOTE — Progress Notes (Signed)
Patient states that she is breathing better since she received a breathing treatment. Will continue to monitor.

## 2018-02-14 NOTE — Progress Notes (Signed)
Prior auth for xopenex denied even after explaining that patient was having a fib with RVR.  So I what I infer is that insurance would rather have patient re-admitted with  A. Fib with RVR.- [facility].  Marlin Canary DO

## 2018-02-14 NOTE — Care Management Note (Addendum)
Case Management Note  Patient Details  Name: [name] MRN: [id number] Date of Birth: [birth year]  Subjective/Objective:     From home with family.  For discharge today, has order for neb machine, NCM received call from [name] with [facility] stating patient received one on 04/06/16 and it has not been five years yet so if the patient wants another one the patient will have to pay privately for it , it is around 40.00. NCM spoke with patient's daughter ,she states they have a nebulizer machine at home they just need the medications to go with it. Daughter will be here around 5 or 6 pm.                Action/Plan: DC home when ready.   Expected Discharge Date:  02/14/18               Expected Discharge Plan:  Home/Self Care  In-House Referral:     Discharge planning Services  CM Consult  Post Acute Care Choice:  Durable Medical Equipment Choice offered to:     DME Arranged:  Nebulizer machine DME Agency:  [facility]  HH Arranged:    [facility] Agency:     Status of Service:  Completed, signed off  If discussed at Long Length of Stay Meetings, dates discussed:    Additional Comments:  Lauren Haven, RN 02/14/2018, 9:53 AM

## 2018-02-15 LAB — T3, FREE: T3 FREE: 2.2 pg/mL (ref 2.0–4.4)

## 2018-02-17 LAB — CULTURE, BLOOD (ROUTINE X 2)
Culture: NO GROWTH
Culture: NO GROWTH
Special Requests: ADEQUATE
Special Requests: ADEQUATE

## 2018-08-21 ENCOUNTER — Other Ambulatory Visit: Payer: Self-pay | Admitting: Physical Medicine & Rehabilitation

## 2020-10-17 ENCOUNTER — Emergency Department (HOSPITAL_COMMUNITY): Payer: Medicare Other

## 2020-10-17 ENCOUNTER — Inpatient Hospital Stay (HOSPITAL_COMMUNITY)
Admission: EM | Admit: 2020-10-17 | Discharge: 2020-10-20 | DRG: 536 | Disposition: A | Discharge status: Rehabilitation Facility | Payer: Medicare Other | Attending: Internal Medicine | Admitting: Internal Medicine

## 2020-10-17 ENCOUNTER — Encounter (HOSPITAL_COMMUNITY): Payer: Self-pay

## 2020-10-17 ENCOUNTER — Observation Stay (HOSPITAL_COMMUNITY): Payer: Medicare Other

## 2020-10-17 ENCOUNTER — Other Ambulatory Visit: Payer: Self-pay

## 2020-10-17 DIAGNOSIS — E119 Type 2 diabetes mellitus without complications: Secondary | ICD-10-CM

## 2020-10-17 DIAGNOSIS — R031 Nonspecific low blood-pressure reading: Secondary | ICD-10-CM | POA: Diagnosis not present

## 2020-10-17 DIAGNOSIS — Z20822 Contact with and (suspected) exposure to covid-19: Secondary | ICD-10-CM | POA: Diagnosis present

## 2020-10-17 DIAGNOSIS — W19XXXA Unspecified fall, initial encounter: Secondary | ICD-10-CM

## 2020-10-17 DIAGNOSIS — R296 Repeated falls: Secondary | ICD-10-CM | POA: Diagnosis present

## 2020-10-17 DIAGNOSIS — Z8701 Personal history of pneumonia (recurrent): Secondary | ICD-10-CM

## 2020-10-17 DIAGNOSIS — R0600 Dyspnea, unspecified: Secondary | ICD-10-CM

## 2020-10-17 DIAGNOSIS — I10 Essential (primary) hypertension: Secondary | ICD-10-CM | POA: Diagnosis present

## 2020-10-17 DIAGNOSIS — M479 Spondylosis, unspecified: Secondary | ICD-10-CM | POA: Diagnosis present

## 2020-10-17 DIAGNOSIS — S72009A Fracture of unspecified part of neck of unspecified femur, initial encounter for closed fracture: Secondary | ICD-10-CM | POA: Diagnosis present

## 2020-10-17 DIAGNOSIS — Z90710 Acquired absence of both cervix and uterus: Secondary | ICD-10-CM

## 2020-10-17 DIAGNOSIS — S60221A Contusion of right hand, initial encounter: Secondary | ICD-10-CM | POA: Diagnosis present

## 2020-10-17 DIAGNOSIS — Y92018 Other place in single-family (private) house as the place of occurrence of the external cause: Secondary | ICD-10-CM

## 2020-10-17 DIAGNOSIS — Z8679 Personal history of other diseases of the circulatory system: Secondary | ICD-10-CM

## 2020-10-17 DIAGNOSIS — S5011XA Contusion of right forearm, initial encounter: Secondary | ICD-10-CM | POA: Diagnosis present

## 2020-10-17 DIAGNOSIS — R52 Pain, unspecified: Secondary | ICD-10-CM

## 2020-10-17 DIAGNOSIS — Z79899 Other long term (current) drug therapy: Secondary | ICD-10-CM

## 2020-10-17 DIAGNOSIS — I69354 Hemiplegia and hemiparesis following cerebral infarction affecting left non-dominant side: Secondary | ICD-10-CM

## 2020-10-17 DIAGNOSIS — R079 Chest pain, unspecified: Secondary | ICD-10-CM | POA: Diagnosis not present

## 2020-10-17 DIAGNOSIS — S32591A Other specified fracture of right pubis, initial encounter for closed fracture: Principal | ICD-10-CM | POA: Diagnosis present

## 2020-10-17 DIAGNOSIS — E785 Hyperlipidemia, unspecified: Secondary | ICD-10-CM | POA: Diagnosis present

## 2020-10-17 DIAGNOSIS — M25569 Pain in unspecified knee: Secondary | ICD-10-CM

## 2020-10-17 DIAGNOSIS — M25561 Pain in right knee: Secondary | ICD-10-CM | POA: Diagnosis present

## 2020-10-17 DIAGNOSIS — E739 Lactose intolerance, unspecified: Secondary | ICD-10-CM | POA: Diagnosis present

## 2020-10-17 DIAGNOSIS — M170 Bilateral primary osteoarthritis of knee: Secondary | ICD-10-CM | POA: Diagnosis present

## 2020-10-17 DIAGNOSIS — E1140 Type 2 diabetes mellitus with diabetic neuropathy, unspecified: Secondary | ICD-10-CM | POA: Diagnosis present

## 2020-10-17 DIAGNOSIS — E781 Pure hyperglyceridemia: Secondary | ICD-10-CM | POA: Diagnosis present

## 2020-10-17 DIAGNOSIS — R339 Retention of urine, unspecified: Secondary | ICD-10-CM | POA: Diagnosis present

## 2020-10-17 DIAGNOSIS — S60511A Abrasion of right hand, initial encounter: Secondary | ICD-10-CM | POA: Diagnosis present

## 2020-10-17 DIAGNOSIS — G4733 Obstructive sleep apnea (adult) (pediatric): Secondary | ICD-10-CM

## 2020-10-17 DIAGNOSIS — E1142 Type 2 diabetes mellitus with diabetic polyneuropathy: Secondary | ICD-10-CM | POA: Diagnosis present

## 2020-10-17 DIAGNOSIS — E1165 Type 2 diabetes mellitus with hyperglycemia: Secondary | ICD-10-CM | POA: Diagnosis present

## 2020-10-17 DIAGNOSIS — W010XXA Fall on same level from slipping, tripping and stumbling without subsequent striking against object, initial encounter: Secondary | ICD-10-CM | POA: Diagnosis present

## 2020-10-17 DIAGNOSIS — M549 Dorsalgia, unspecified: Secondary | ICD-10-CM | POA: Diagnosis present

## 2020-10-17 DIAGNOSIS — Z7901 Long term (current) use of anticoagulants: Secondary | ICD-10-CM

## 2020-10-17 DIAGNOSIS — I482 Chronic atrial fibrillation, unspecified: Secondary | ICD-10-CM | POA: Diagnosis present

## 2020-10-17 DIAGNOSIS — R109 Unspecified abdominal pain: Secondary | ICD-10-CM | POA: Diagnosis present

## 2020-10-17 DIAGNOSIS — G8929 Other chronic pain: Secondary | ICD-10-CM | POA: Diagnosis present

## 2020-10-17 DIAGNOSIS — Z7984 Long term (current) use of oral hypoglycemic drugs: Secondary | ICD-10-CM

## 2020-10-17 DIAGNOSIS — K219 Gastro-esophageal reflux disease without esophagitis: Secondary | ICD-10-CM | POA: Diagnosis present

## 2020-10-17 LAB — CBC WITH DIFFERENTIAL/PLATELET
Abs Immature Granulocytes: 0.14 10*3/uL — ABNORMAL HIGH (ref 0.00–0.07)
Basophils Absolute: 0.1 10*3/uL (ref 0.0–0.1)
Basophils Relative: 1 %
Eosinophils Absolute: 0 10*3/uL (ref 0.0–0.5)
Eosinophils Relative: 0 %
HCT: 40.5 % (ref 36.0–46.0)
Hemoglobin: 13.3 g/dL (ref 12.0–15.0)
Immature Granulocytes: 1 %
Lymphocytes Relative: 12 %
Lymphs Abs: 1.5 10*3/uL (ref 0.7–4.0)
MCH: 33.1 pg (ref 26.0–34.0)
MCHC: 32.8 g/dL (ref 30.0–36.0)
MCV: 100.7 fL — ABNORMAL HIGH (ref 80.0–100.0)
Monocytes Absolute: 0.7 10*3/uL (ref 0.1–1.0)
Monocytes Relative: 6 %
Neutro Abs: 9.7 10*3/uL — ABNORMAL HIGH (ref 1.7–7.7)
Neutrophils Relative %: 80 %
Platelets: 301 10*3/uL (ref 150–400)
RBC: 4.02 MIL/uL (ref 3.87–5.11)
RDW: 12.2 % (ref 11.5–15.5)
WBC: 12.1 10*3/uL — ABNORMAL HIGH (ref 4.0–10.5)
nRBC: 0 % (ref 0.0–0.2)

## 2020-10-17 LAB — COMPREHENSIVE METABOLIC PANEL
ALT: 19 U/L (ref 0–44)
AST: 25 U/L (ref 15–41)
Albumin: 4.7 g/dL (ref 3.5–5.0)
Alkaline Phosphatase: 39 U/L (ref 38–126)
Anion gap: 12 (ref 5–15)
BUN: 17 mg/dL (ref 8–23)
CO2: 27 mmol/L (ref 22–32)
Calcium: 9.7 mg/dL (ref 8.9–10.3)
Chloride: 103 mmol/L (ref 98–111)
Creatinine, Ser: 0.75 mg/dL (ref 0.44–1.00)
GFR, Estimated: >60 mL/min (ref >60)
Glucose, Bld: 138 mg/dL — ABNORMAL HIGH (ref 70–99)
Potassium: 4 mmol/L (ref 3.5–5.1)
Sodium: 142 mmol/L (ref 135–145)
Total Bilirubin: 0.6 mg/dL (ref 0.3–1.2)
Total Protein: 7.5 g/dL (ref 6.5–8.1)

## 2020-10-17 LAB — GLUCOSE, CAPILLARY: Glucose-Capillary: 171 mg/dL — ABNORMAL HIGH (ref 70–99)

## 2020-10-17 LAB — RESP PANEL BY RT-PCR (FLU A&B, COVID) ARPGX2
Influenza A by PCR: NEGATIVE
Influenza B by PCR: NEGATIVE
SARS Coronavirus 2 by RT PCR: NEGATIVE

## 2020-10-17 MED ORDER — GABAPENTIN 250 MG/5ML PO SOLN
100.0000 mg | Freq: Every evening | ORAL | Status: DC | PRN
  Filled 2020-10-17: qty 2

## 2020-10-17 MED ORDER — ONDANSETRON HCL 4 MG/2ML IJ SOLN
4.0000 mg | Freq: Once | INTRAMUSCULAR | Status: AC
  Administered 2020-10-17: 4 mg via INTRAVENOUS
  Filled 2020-10-17: qty 2

## 2020-10-17 MED ORDER — PRAVASTATIN SODIUM 20 MG PO TABS: 10.0000 mg | ORAL_TABLET | Freq: Every day | ORAL | Status: DC

## 2020-10-17 MED ORDER — MIRABEGRON ER 25 MG PO TB24
50.0000 mg | ORAL_TABLET | Freq: Every day | ORAL | Status: DC
  Administered 2020-10-17 – 2020-10-19 (×3): 50 mg via ORAL
  Filled 2020-10-17 (×4): qty 2

## 2020-10-17 MED ORDER — ZOLPIDEM TARTRATE 5 MG PO TABS: 5.0000 mg | ORAL_TABLET | Freq: Every evening | ORAL | Status: DC | PRN

## 2020-10-17 MED ORDER — ENOXAPARIN SODIUM 40 MG/0.4ML ~~LOC~~ SOLN
40.0000 mg | SUBCUTANEOUS | Status: DC
  Administered 2020-10-18: 40 mg via SUBCUTANEOUS
  Filled 2020-10-17: qty 0.4

## 2020-10-17 MED ORDER — OXYCODONE-ACETAMINOPHEN 5-325 MG PO TABS
1.0000 | ORAL_TABLET | ORAL | Status: DC | PRN
  Administered 2020-10-17 – 2020-10-20 (×7): 1 via ORAL
  Filled 2020-10-17 (×8): qty 1

## 2020-10-17 MED ORDER — METOPROLOL TARTRATE 25 MG PO TABS
12.5000 mg | ORAL_TABLET | Freq: Two times a day (BID) | ORAL | Status: DC
  Administered 2020-10-17 – 2020-10-20 (×4): 12.5 mg via ORAL
  Filled 2020-10-17 (×5): qty 1

## 2020-10-17 MED ORDER — DOCUSATE SODIUM 100 MG PO CAPS
100.0000 mg | ORAL_CAPSULE | Freq: Two times a day (BID) | ORAL | Status: DC
  Administered 2020-10-17 – 2020-10-19 (×5): 100 mg via ORAL
  Filled 2020-10-17 (×5): qty 1

## 2020-10-17 MED ORDER — KETOROLAC TROMETHAMINE 15 MG/ML IJ SOLN
15.0000 mg | Freq: Four times a day (QID) | INTRAMUSCULAR | Status: DC
  Administered 2020-10-18 (×4): 15 mg via INTRAVENOUS
  Filled 2020-10-17 (×4): qty 1

## 2020-10-17 MED ORDER — FLUTICASONE PROPIONATE 50 MCG/ACT NA SUSP
1.0000 | Freq: Every day | NASAL | Status: DC | PRN
  Filled 2020-10-17: qty 16

## 2020-10-17 MED ORDER — POLYETHYLENE GLYCOL 3350 17 G PO PACK: 17.0000 g | PACK | Freq: Every day | ORAL | Status: DC | PRN

## 2020-10-17 MED ORDER — LEVALBUTEROL TARTRATE 45 MCG/ACT IN AERO
1.0000 | INHALATION_SPRAY | RESPIRATORY_TRACT | Status: DC | PRN
  Filled 2020-10-17 (×2): qty 15

## 2020-10-17 MED ORDER — OXYCODONE-ACETAMINOPHEN 5-325 MG PO TABS: 1.0000 | ORAL_TABLET | Freq: Once | ORAL | Status: DC

## 2020-10-17 MED ORDER — ONDANSETRON 4 MG PO TBDP: 4.0000 mg | ORAL_TABLET | Freq: Once | ORAL | Status: DC

## 2020-10-17 MED ORDER — LIDOCAINE 5 % EX PTCH
2.0000 | MEDICATED_PATCH | Freq: Every day | CUTANEOUS | Status: DC | PRN
  Administered 2020-10-18: 2 via TRANSDERMAL
  Filled 2020-10-17 (×3): qty 2

## 2020-10-17 MED ORDER — ACETAMINOPHEN 325 MG PO TABS
650.0000 mg | ORAL_TABLET | Freq: Four times a day (QID) | ORAL | Status: DC | PRN
  Administered 2020-10-18 – 2020-10-19 (×2): 650 mg via ORAL
  Filled 2020-10-17 (×2): qty 2

## 2020-10-17 MED ORDER — PRAVASTATIN SODIUM 20 MG PO TABS
20.0000 mg | ORAL_TABLET | Freq: Every day | ORAL | Status: DC
  Administered 2020-10-17 – 2020-10-19 (×3): 20 mg via ORAL
  Filled 2020-10-17 (×4): qty 1

## 2020-10-17 MED ORDER — GABAPENTIN 250 MG/5ML PO SOLN
100.0000 mg | Freq: Every evening | ORAL | Status: DC | PRN
Start: 2020-10-17 — End: 2020-10-20
  Filled 2020-10-17: qty 2

## 2020-10-17 MED ORDER — TIZANIDINE HCL 4 MG PO TABS
2.0000 mg | ORAL_TABLET | Freq: Three times a day (TID) | ORAL | Status: DC | PRN
  Administered 2020-10-19: 2 mg via ORAL
  Filled 2020-10-17 (×2): qty 1

## 2020-10-17 MED ORDER — INSULIN ASPART 100 UNIT/ML ~~LOC~~ SOLN
0.0000 [IU] | Freq: Three times a day (TID) | SUBCUTANEOUS | Status: DC
  Administered 2020-10-18 – 2020-10-19 (×3): 2 [IU] via SUBCUTANEOUS
  Administered 2020-10-19 – 2020-10-20 (×2): 1 [IU] via SUBCUTANEOUS

## 2020-10-17 MED ORDER — HYDROMORPHONE HCL 1 MG/ML IJ SOLN
0.5000 mg | INTRAMUSCULAR | Status: DC | PRN
  Administered 2020-10-17: 0.5 mg via INTRAVENOUS
  Filled 2020-10-17: qty 0.5

## 2020-10-17 MED ORDER — INSULIN ASPART 100 UNIT/ML ~~LOC~~ SOLN: 0.0000 [IU] | Freq: Every day | SUBCUTANEOUS | Status: DC

## 2020-10-17 MED ORDER — BUTALBITAL-APAP-CAFFEINE 50-325-40 MG PO TABS: 1.0000 | ORAL_TABLET | Freq: Two times a day (BID) | ORAL | Status: DC | PRN

## 2020-10-17 MED ORDER — MORPHINE SULFATE (PF) 4 MG/ML IV SOLN
4.0000 mg | Freq: Once | INTRAVENOUS | Status: AC
  Administered 2020-10-17: 4 mg via INTRAVENOUS
  Filled 2020-10-17: qty 1

## 2020-10-17 NOTE — ED Provider Notes (Signed)
[name]   CSN: [id number] Arrival date & time: 10/17/20  1626     History Chief Complaint  Patient presents with  . Fall    [address] [name] is a 80 y.o. female.  HPI   Patient is a 80 year old female with a medical history as noted below.  She presents the emergency department due to a fall that occurred prior to arrival.  Her daughter is at bedside and acts as a Engineer, technical sales.  She states that her mother was ambulating with a walker which she does at baseline.  She was going out the front door to enjoy the weather today and the walker speeded up on the concrete and came out from under her and she fell on her right side.  She has bruising and pain to the right forearm and right hand.  Patient reports exquisite pain to the right superior thigh.  Pain worsens when bearing weight and with movement of the right leg.  Patient is anticoagulated on Eliquis.  She confirms the history above.  She states that she lost her balance and fell on her right side.  Denies any LOC before or during the event.  Denies any head trauma.  Denies any chest pain or shortness of breath.  No other physical complaints at this time.     Past Medical History:  Diagnosis Date  . Atrial fibrillation (HCC)   . Chronic back pain    "mid back down into lower back" (08/25/2014)  . Frequent falls   . GERD (gastroesophageal reflux disease)   . Hypertriglyceridemia   . OSA on CPAP   . Osteoarthritis    "knees, hands, back" (08/25/2014)  . Pneumonia ~ 2000 X 1  . Subdural hematoma (HCC) july 2015   S/P fall while on Coumadin  . T12 compression fracture (HCC) 2012  . Type II diabetes mellitus [facility])     Patient Active Problem List   Diagnosis Date Noted  . SIRS (systemic inflammatory response syndrome) (HCC) 02/13/2018  . Atrial fibrillation with RVR (HCC) 02/13/2018  . Hypotension 02/12/2018  . Primary osteoarthritis of left knee 02/09/2017  . SDH (subdural  hematoma) (HCC)   . Urinary frequency   . Type 2 diabetes mellitus with peripheral neuropathy (HCC)   . Cough   . Gait disturbance, post-stroke 10/20/2016  . Hemiparesis affecting left side as late effect of stroke (HCC) 10/20/2016  . Essential hypertension 10/19/2016  . Hyperlipidemia LDL goal <70 10/19/2016  . 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  . Left hemiparesis (HCC)   . Atrial fibrillation (HCC)   . History of fall   . History of traumatic subdural hematoma   . Vascular headache   . Hypokalemia   . Leukocytosis   . Acute blood loss anemia   . Hypoalbuminemia due to protein-calorie malnutrition (HCC)   . Dysphagia, post-stroke   . Acute respiratory failure (HCC)   . Acute embolic stroke (HCC) - R putamen/caudate and R insular infarcts s/p TICI3 revascularization w/ mechanical thrombectomy d/t AF not on [facility] 10/15/2016  . Pressure injury of skin 10/15/2016  . HCAP (healthcare-associated pneumonia) 11/09/2014  . Weakness 08/25/2014  . Acute bronchitis 08/25/2014  . UTI (lower urinary tract infection) 08/25/2014  . Fracture of lumbar spine (HCC) 08/25/2014  . Subdural hematoma, post-traumatic (HCC) 05/12/2014  . Chronic atrial fibrillation (HCC) 05/12/2014  . Diabetes mellitus (HCC) 05/12/2014  . OSA on CPAP 05/12/2014    Past Surgical History:  Procedure Laterality Date  .  APPENDECTOMY  2012  . CATARACT EXTRACTION W/ INTRAOCULAR LENS  IMPLANT, BILATERAL Bilateral 2000's  . IR GENERIC HISTORICAL  10/15/2016   IR PERCUTANEOUS ART THROMBECTOMY/INFUSION INTRACRANIAL INC DIAG ANGIO 10/15/2016 [name], MD [facility]  . IR GENERIC HISTORICAL  12/13/2016   IR RADIOLOGIST EVAL & MGMT 12/13/2016 [facility]  . RADIOLOGY WITH ANESTHESIA N/A 10/15/2016   Procedure: RADIOLOGY WITH ANESTHESIA;  Surgeon: [name], MD;  Location: [place];  Service: Radiology;  Laterality: N/A;  . TOTAL ABDOMINAL HYSTERECTOMY  1990     OB History   No obstetric history on  file.     Family History  Problem Relation Age of Onset  . Diabetes Mellitus II Neg Hx   . Hypertension Neg Hx     Social History   Tobacco Use  . Smoking status: Passive Smoke Exposure - Never Smoker  . Smokeless tobacco: Never Used  Substance Use Topics  . Alcohol use: No  . Drug use: No    Home Medications Prior to Admission medications   Medication Sig Start Date End Date Taking? Authorizing Provider  acetaminophen (TYLENOL) 500 MG tablet Take 1,000 mg every 12 (twelve) hours as needed by mouth for headache (pain).     [provider]  albuterol (PROVENTIL) (2.5 MG/3ML) 0.083% nebulizer solution Take 2.5 mg every 6 (six) hours as needed by nebulization for wheezing or shortness of breath.    [provider]  alendronate (FOSAMAX) 70 MG tablet Take 70 mg by mouth every Monday.  04/30/14   [provider]  apixaban (ELIQUIS) 5 MG TABS tablet Take 5 mg 2 (two) times daily by mouth.    [provider]  butalbital-acetaminophen-caffeine (FIORICET, ESGIC) 50-325-40 MG tablet Take 1 tablet by mouth every 12 (twelve) hours as needed for headache. Patient taking differently: Take 1 tablet every 12 (twelve) hours as needed by mouth (severe headaches).  11/01/16   Angiulli, Lavon Paganini, PA-C  cetirizine (ZYRTEC) 10 MG tablet Take 10 mg daily as needed by mouth (seasonal allergies).    [provider]  chlorpheniramine-HYDROcodone (TUSSIONEX PENNKINETIC ER) 10-8 MG/5ML SUER Take 5 mLs by mouth every 12 (twelve) hours as needed for cough.    [provider]  diclofenac sodium (VOLTAREN) 1 % GEL Apply 2 g topically 4 (four) times daily. Patient taking differently: Apply 2 g topically 4 (four) times daily as needed (PAIN).  11/08/16   Kirsteins, Luanna Salk, MD  doxycycline (VIBRA-TABS) 100 MG tablet Take 1 tablet (100 mg total) by mouth every 12 (twelve) hours. 02/14/18   Geradine Girt, DO  fluticasone (FLONASE) 50 MCG/ACT nasal spray Place 1 spray  daily as needed into both nostrils (seasonal allergies).  02/18/14   [provider]  HYDROcodone-acetaminophen (NORCO/VICODIN) 5-325 MG tablet Take 1 tablet by mouth every 12 (twelve) hours as needed for moderate pain.    [provider]  levalbuterol (XOPENEX HFA) 45 MCG/ACT inhaler Inhale 1 puff every 4 (four) hours as needed into the lungs for wheezing or shortness of breath.  04/22/15   [provider]  Lidocaine 4 % PTCH Apply 2 patches daily topically.    [provider]  metFORMIN (GLUCOPHAGE) 500 MG tablet Take 500 mg by mouth 2 (two) times daily. 11/22/16   [provider]  metoprolol tartrate (LOPRESSOR) 25 MG tablet Take 0.5 tablets (12.5 mg total) by mouth 2 (two) times daily. 11/01/16   Angiulli, Lavon Paganini, PA-C  mirabegron ER (MYRBETRIQ) 50 MG TB24 tablet Take  50 mg at bedtime by mouth.  02/24/17   [provider]  pravastatin (PRAVACHOL) 10 MG tablet Take 1 tablet (10 mg total) by mouth daily. Patient taking differently: Take 10 mg at bedtime by mouth.  11/01/16   Angiulli, Mcarthur Rossetti, PA-C  senna-docusate (SENNA-S) 8.6-50 MG tablet Take 2 tablets 2 (two) times daily as needed by mouth (while taking narcotic pain medication).    [provider]  tiZANidine (ZANAFLEX) 4 MG tablet TAKE 1 TABLET(4 MG) BY MOUTH AT BEDTIME Patient taking differently: TAKE 1 TABLET(4 MG) BY MOUTH AT BEDTIME AS NEEDED FOR SPASM 02/12/18   Kirsteins, Victorino Sparrow, MD  traMADol (ULTRAM) 50 MG tablet Take 50 mg every 12 (twelve) hours as needed by mouth (pain).  11/11/16   [provider]    Allergies    Lactose intolerance (gi), Banana, Peanut-containing drug products, and Chocolate  Review of Systems   Review of Systems  All other systems reviewed and are negative. Ten systems reviewed and are negative for acute change, except as noted in the HPI.   Physical Exam Updated Vital Signs BP 138/68 (BP Location: Left Arm)   Pulse 91   Temp (!) 97.5 F  (36.4 C) (Oral)   Resp 19   SpO2 99%   Physical Exam Vitals and nursing [name] reviewed.  Constitutional:      General: She is not in acute distress.    Appearance: Normal appearance. She is not ill-appearing, toxic-appearing or diaphoretic.  HENT:     Head: Normocephalic and atraumatic.     Right Ear: External ear normal.     Left Ear: External ear normal.     Nose: Nose normal.     Mouth/Throat:     Mouth: Mucous membranes are moist.     Pharynx: Oropharynx is clear. No oropharyngeal exudate or posterior oropharyngeal erythema.  Eyes:     Extraocular Movements: Extraocular movements intact.  Cardiovascular:     Rate and Rhythm: Normal rate and regular rhythm.     Pulses: Normal pulses.     Heart sounds: Normal heart sounds. No murmur heard. No friction rub. No gallop.   Pulmonary:     Effort: Pulmonary effort is normal. No respiratory distress.     Breath sounds: Normal breath sounds. No stridor. No wheezing, rhonchi or rales.  Abdominal:     General: Abdomen is flat.     Tenderness: There is no abdominal tenderness.  Musculoskeletal:        General: Tenderness present. Normal range of motion.     Cervical back: Normal range of motion and neck supple. No tenderness.     Right lower leg: No edema.     Left lower leg: No edema.     Comments: Moderate TTP noted along the right proximal anterior femur.  Additional mild TTP noted along the right anterior hip.  Worsening pain with passive right hip flexion.  Unable to assess active range of motion due to patient's pain.  Distal sensation intact.  Palpable pedal pulses.  Mild bruising noted to the right forearm.  Additional bruising as well as an abrasion noted to the dorsum of the right hand.  Full range of motion of the right elbow, right wrist, and all of the fingers of the right hand.  Distal sensation intact.  Palpable radial pulses.  Skin:    General: Skin is warm and dry.  Neurological:     General: No focal deficit present.      Mental Status:  She is alert and oriented to person, place, and time.  Psychiatric:        Mood and Affect: Mood normal.        Behavior: Behavior normal.    ED Results / Procedures / Treatments   Labs (all labs ordered are listed, but only abnormal results are displayed) Labs Reviewed  CBC WITH DIFFERENTIAL/PLATELET - Abnormal; Notable for the following components:      Result Value   WBC 12.1 (*)    MCV 100.7 (*)    Neutro Abs 9.7 (*)    Abs Immature Granulocytes 0.14 (*)    All other components within normal limits  RESP PANEL BY RT-PCR (FLU A&B, COVID) [id number]  COMPREHENSIVE METABOLIC PANEL   EKG None  Radiology DG Pelvis 1-2 Views  Result Date: 10/17/2020 CLINICAL DATA:  Fall with right hip pain EXAM: PELVIS - 1-2 VIEW COMPARISON:  01/16/2020 FINDINGS: Both femoral heads project in joint. Pubic symphysis is intact. Suspected acute fractures of the right superior and inferior pubic rami. There may be associated lucent lesion within the right inferior pubic ramus. IMPRESSION: Suspected acute fractures of the right superior and inferior pubic rami with possible associated lucent lesion in the right inferior pubic ramus. Electronically Signed   By: Jasmine Pang M.D.   On: 10/17/2020 17:46   DG Forearm Right  Result Date: 10/17/2020 CLINICAL DATA:  Right forearm pain after fall EXAM: RIGHT FOREARM - 2 VIEW COMPARISON:  None. FINDINGS: There is no evidence of fracture or other focal bone lesions. Soft tissues are unremarkable. IMPRESSION: Negative. Electronically Signed   By: Duanne Guess D.O.   On: 10/17/2020 17:44   CT Head Wo Contrast  Result Date: 10/17/2020 CLINICAL DATA:  Fall.  Anticoagulated EXAM: CT HEAD WITHOUT CONTRAST TECHNIQUE: Contiguous axial images were obtained from the base of the skull through the vertex without intravenous contrast. COMPARISON:  None. FINDINGS: Brain: Diffuse cerebral atrophy. Ventricular dilatation consistent with central atrophy.  Low-attenuation changes in the deep white matter consistent small vessel ischemic change. Old lacunar infarcts in the deep white matter. No mass effect or midline shift. No abnormal extra-axial fluid collections. Gray-white matter junctions are distinct. Basal cisterns are not effaced. No acute intracranial hemorrhage. Basal ganglia calcifications. Vascular: Moderate intracranial arterial vascular calcifications. Skull: Calvarium appears intact. Sinuses/Orbits: Paranasal sinuses and mastoid air cells are clear. Other: None. IMPRESSION: 1. No acute intracranial abnormalities. 2. Chronic atrophy and small vessel ischemic changes. Old lacunar infarcts in the deep white matter. Electronically Signed   By: Burman Nieves M.D.   On: 10/17/2020 17:09   CT PELVIS WO CONTRAST  Result Date: 10/17/2020 CLINICAL DATA:  Fall. EXAM: CT PELVIS WITHOUT CONTRAST TECHNIQUE: Multidetector CT imaging of the pelvis was performed following the standard protocol without intravenous contrast. COMPARISON:  January 18, 2008. FINDINGS: Urinary Tract:  No abnormality visualized. Bowel:  Unremarkable visualized pelvic bowel loops. Vascular/Lymphatic: No pathologically enlarged lymph nodes. No significant vascular abnormality seen. Reproductive: Status post hysterectomy. No adnexal abnormality is noted. Other:  No hernia or abnormal fluid collection is noted. Musculoskeletal: Moderately displaced and comminuted fracture is seen involving the right inferior pubic ramus. There is a nondisplaced fracture involving the right superior pubic ramus at its junction with the anterior acetabulum. IMPRESSION: 1. Moderately displaced and comminuted fracture is seen involving the right inferior pubic ramus. 2. Nondisplaced fracture involving the right superior pubic ramus at its junction with the anterior acetabulum. Electronically Signed   By: Zenda Alpers.D.  On: 10/17/2020 18:21   DG Hand Complete Right  Result Date: 10/17/2020 CLINICAL DATA:   Right hand pain after fall EXAM: RIGHT HAND - COMPLETE 3+ VIEW COMPARISON:  None. FINDINGS: No acute fracture. No dislocation. Mild degenerative changes throughout the hand and wrist. No focal erosion. Soft tissues within normal limits. IMPRESSION: No acute findings. Mild degenerative changes. Electronically Signed   By: Duanne Guess D.O.   On: 10/17/2020 17:43   DG FEMUR, MIN 2 VIEWS RIGHT  Result Date: 10/17/2020 CLINICAL DATA:  Fall with right hip pain EXAM: RIGHT FEMUR 2 VIEWS COMPARISON:  01/16/2020 FINDINGS: No fracture or malalignment of the right femur. The right femoral head projects in joint. Suspected acute fractures involving the right superior and inferior pubic ramus. Questionable subtle lucent lesion in the right inferior pubic ramus. IMPRESSION: 1. Suspected acute fractures involving the right superior and inferior pubic rami. 2. Possible lucent lesion in the right inferior pubic ramus, raising concern for pathologic fracture Electronically Signed   By: Jasmine Pang M.D.   On: 10/17/2020 17:45    Procedures Procedures (including critical care time)  Medications Ordered in ED Medications  morphine 4 MG/ML injection 4 mg (4 mg Intravenous Given 10/17/20 1827)  ondansetron (ZOFRAN) injection 4 mg (4 mg Intravenous Given 10/17/20 1827)   ED Course  I have reviewed the triage vital signs and the nursing notes.  Pertinent labs & imaging results that were available during my care of the patient were reviewed by me and considered in my medical decision making (see chart for details).  Clinical Course as of 10/17/20 1845  Sat Oct 17, 2020  1715 CT Head Wo Contrast IMPRESSION: 1. No acute intracranial abnormalities. 2. Chronic atrophy and small vessel ischemic changes. Old lacunar infarcts in the deep white matter. [LJ]  1755 DG Pelvis 1-2 Views IMPRESSION: Suspected acute fractures of the right superior and inferior pubic rami with possible associated lucent lesion in the right  inferior pubic ramus. [LJ]  1827 CT PELVIS WO CONTRAST IMPRESSION: 1. Moderately displaced and comminuted fracture is seen involving the right inferior pubic ramus. 2. Nondisplaced fracture involving the right superior pubic ramus at its junction with the anterior acetabulum. [LJ]  [id number] I spoke to Dr. Dion Saucier with orthopedic surgery.  No surgical intervention necessary.  Recommends medical admission if patient is unable to bear weight.  Admission for pain control.  He states that they will consult on the patient and will likely evaluate her tomorrow morning if admitted. [LJ]    Clinical Course User Index [LJ] Placido Sou, PA-C   MDM Rules/Calculators/A&P                          Patient is a 80 year old female with history of CVA as well as left-sided hemiparesis who ambulates with a walker at baseline.  Presents today due to a mechanical fall.  CT scan showing a moderately displaced and comminuted fracture involving the right inferior pubic ramus.  Also a nondisplaced fracture involving the right superior pubic ramus at its junction with the anterior acetabulum.  Patient given morphine as well as Zofran for pain.  Discussed with Dr. Dion Saucier with orthopedic surgery.  If patient unable to bear weight recommends admission for pain control.  They will consult on the patient.  I spoke to patient's daughter in length who is her primary caregiver.  Does not feel that patient is capable of being discharged at this time.  Will discuss with  the hospitalist team for likely admission.  Respiratory panel has been ordered.  Final Clinical Impression(s) / ED Diagnoses Final diagnoses:  Closed fracture of ramus of right pubis, initial encounter [facility](HCC)   Rx / DC Orders ED Discharge Orders    None       Placido SouJoldersma, Gwenneth Whiteman, PA-C 10/17/20 1853    Charlynne PanderYao, David Hsienta, MD 10/18/20 [phone]

## 2020-10-17 NOTE — H&P (Signed)
[facility] History and Physical   Patient: [name] OQH:[id number]   PCP: [name], MD DOB: [birth year]   DOA: 10/17/2020   DOS: 10/17/2020   DOS: the patient was seen and examined on 10/17/2020  Patient coming from: The patient is coming from Home  Chief Complaint: Fall  HPI: [name] is a 80 y.o. female with Past medical history of chronic A. fib, chronic back pain, GERD, recurrent fall uses a Rollator to walk, OSA on CPAP, HLD, type II DM, chronic anticoagulation, history of SDH after a fall on Coumadin, history of CVA 4 years ago, T12 compression fracture. Patient presented with a mechanical fall. 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  The Rollator that she used to walk around moved foster and she fell on her right side.  No passing out event, no dizziness or lightheadedness.  Denies having any head or neck injury. Denies having any complaints of headache, neck pain, chest pain, nausea, vomiting. No constipation no diarrhea.  No burning urination.  No focal deficit. 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 She has chronic back pain for which she takes Norco and gabapentin as needed. She also takes as needed Zanaflex. She also reports some abdominal pain which is diffuse in nature.  This resolved on reevaluation after an hour.  ED Course: Presents with above complaint.  Found to have right pubic rami fracture.  Orthopedic was consulted and recommended conservative measures with pain control.  Weightbearing as tolerated.  Patient was referred for admission due to severe uncontrolled pain on morphine.   Review of Systems: as mentioned in the history of present illness.  All other systems reviewed and are negative.  Past Medical History:  Diagnosis Date  . Atrial fibrillation (HCC)   . Chronic back pain    "mid back down into lower back" (08/25/2014)  . Frequent falls   . GERD (gastroesophageal reflux disease)   . Hypertriglyceridemia   . OSA on CPAP   .  Osteoarthritis    "knees, hands, back" (08/25/2014)  . Pneumonia ~ 2000 X 1  . Subdural hematoma (HCC) july 2015   S/P fall while on Coumadin  . T12 compression fracture (HCC) 2012  . Type II diabetes mellitus (HCC)    Past Surgical History:  Procedure Laterality Date  . APPENDECTOMY  2012  . CATARACT EXTRACTION W/ INTRAOCULAR LENS  IMPLANT, BILATERAL Bilateral 2000's  . IR GENERIC HISTORICAL  10/15/2016   IR PERCUTANEOUS ART THROMBECTOMY/INFUSION INTRACRANIAL INC DIAG ANGIO 10/15/2016 [name], MD [facility]  . IR GENERIC HISTORICAL  12/13/2016   IR RADIOLOGIST EVAL & MGMT 12/13/2016 [facility]  . RADIOLOGY WITH ANESTHESIA N/A 10/15/2016   Procedure: RADIOLOGY WITH ANESTHESIA;  Surgeon: [name], MD;  Location: [facility] OR;  Service: Radiology;  Laterality: N/A;  . TOTAL ABDOMINAL HYSTERECTOMY  1990   Social History:  reports that she is a non-smoker but has been exposed to tobacco smoke. She has never used smokeless tobacco. She reports that she does not drink alcohol and does not use drugs.  Allergies  Allergen Reactions  . Lactose Intolerance (Gi) Diarrhea  . Banana Swelling    Hands and feet  . Peanut-Containing Drug Products Itching    Throat itches, no swelling  . Chocolate Itching   Family history reviewed and not pertinent Family History  Problem Relation Age of Onset  . Diabetes Mellitus II Neg Hx   . Hypertension Neg Hx  Prior to Admission medications   Medication Sig Start Date End Date Taking? Authorizing Provider  apixaban (ELIQUIS) 5 MG TABS tablet Take 5 mg 2 (two) times daily by mouth.   Yes [provider]  chlorpheniramine-HYDROcodone (TUSSIONEX) 10-8 MG/5ML SUER Take 5 mLs by mouth every 12 (twelve) hours as needed for cough.   Yes [provider]  Cholecalciferol (VITAMIN D) 50 MCG (2000 UT) tablet Take 2,000 Units by mouth daily.   Yes [provider]  gabapentin (NEURONTIN) 250 MG/5ML solution Take 100 mg  by mouth at bedtime as needed (neuropathy). 08/28/20  Yes [provider]  HYDROcodone-acetaminophen (NORCO/VICODIN) 5-325 MG tablet Take 1 tablet by mouth every 12 (twelve) hours as needed for moderate pain.   Yes [provider]  hydrOXYzine (ATARAX/VISTARIL) 10 MG tablet Take 10 mg by mouth every 8 (eight) hours as needed for itching.   Yes [provider]  Lidocaine 4 % PTCH Apply 2 patches topically daily as needed (pain).   Yes [provider]  Liniments (SALONPAS ARTHRITIS PAIN RELIEF EX) Apply 2 patches topically daily as needed (pain).   Yes [provider]  metFORMIN (GLUCOPHAGE) 500 MG tablet Take 500 mg by mouth 2 (two) times daily. 11/22/16  Yes [provider]  metoprolol tartrate (LOPRESSOR) 25 MG tablet Take 0.5 tablets (12.5 mg total) by mouth 2 (two) times daily. 11/01/16  Yes Angiulli, Lavon Paganini, PA-C  pravastatin (PRAVACHOL) 20 MG tablet Take 20 mg by mouth at bedtime. 08/28/20  Yes [provider]  acetaminophen (TYLENOL) 500 MG tablet Take 1,000 mg every 12 (twelve) hours as needed by mouth for headache (pain).     [provider]  albuterol (PROVENTIL) (2.5 MG/3ML) 0.083% nebulizer solution Take 2.5 mg every 6 (six) hours as needed by nebulization for wheezing or shortness of breath.    [provider]  cetirizine (ZYRTEC) 10 MG tablet Take 10 mg daily as needed by mouth (seasonal allergies).    [provider]  fluticasone (FLONASE) 50 MCG/ACT nasal spray Place 1 spray daily as needed into both nostrils (seasonal allergies).  02/18/14   [provider]  senna-docusate (SENOKOT-S) 8.6-50 MG tablet Take 2 tablets 2 (two) times daily as needed by mouth (while taking narcotic pain medication).    [provider]  tiZANidine (ZANAFLEX) 4 MG tablet TAKE 1 TABLET(4 MG) BY MOUTH AT BEDTIME 02/12/18   Kirsteins, Luanna Salk, MD  traMADol (ULTRAM) 50 MG tablet Take by mouth every 12 (twelve)  hours as needed for moderate pain.    [provider]    Physical Exam: Vitals:   10/17/20 1825 10/17/20 2000 10/17/20 2037 10/17/20 2129  BP: 134/81 132/87 125/77   Pulse: 99 (!) 110 (!) 107   Resp: 18 18 20    Temp:   98 F (36.7 C)   TempSrc:   Oral   SpO2: 96% 92% 93%   Weight:    64.7 kg  Height:    4\' 10"  (1.473 m)    General: alert and oriented to time, place, and person. Appear in moderate distress, affect appropriate Eyes: PERRL, Conjunctiva normal ENT: Oral Mucosa Clear, moist  Neck: no JVD, no Abnormal Mass Or lumps Cardiovascular: S1 and S2 Present, no Murmur, peripheral pulses symmetrical Respiratory: good respiratory effort, Bilateral Air entry equal and Decreased, no signs of accessory muscle use, Clear to Auscultation, no Crackles, no wheezes Abdomen: Bowel Sound present, Soft and mild diffuse tenderness, no hernia Skin: no rashes  Extremities:  bilateral chronic Pedal edema, no calf tenderness Neurologic: without any new focal findings Gait not checked due to patient safety concerns  Data Reviewed: I have personally reviewed and interpreted labs, imaging as discussed below.  CBC: Recent Labs  Lab 10/17/20 1824  WBC 12.1*  NEUTROABS 9.7*  HGB 13.3  HCT 40.5  MCV 100.7*  PLT 301   Basic Metabolic Panel: Recent Labs  Lab 10/17/20 1824  NA 142  K 4.0  CL 103  CO2 27  GLUCOSE 138*  BUN 17  CREATININE 0.75  CALCIUM 9.7   GFR: Estimated Creatinine Clearance: 45.4 mL/min (by C-G formula based on SCr of 0.75 mg/dL). Liver Function Tests: Recent Labs  Lab 10/17/20 1824  AST 25  ALT 19  ALKPHOS 39  BILITOT 0.6  PROT 7.5  ALBUMIN 4.7   No results for input(s): LIPASE, AMYLASE in the last 168 hours. No results for input(s): AMMONIA in the last 168 hours. Coagulation Profile: No results for input(s): INR, PROTIME in the last 168 hours. Cardiac Enzymes: No results for input(s): CKTOTAL, CKMB, CKMBINDEX, TROPONINI in the last 168  hours. BNP (last 3 results) No results for input(s): PROBNP in the last 8760 hours. HbA1C: No results for input(s): HGBA1C in the last 72 hours. CBG: Recent Labs  Lab 10/17/20 2249  GLUCAP 171*   Lipid Profile: No results for input(s): CHOL, HDL, LDLCALC, TRIG, CHOLHDL, LDLDIRECT in the last 72 hours. Thyroid Function Tests: No results for input(s): TSH, T4TOTAL, FREET4, T3FREE, THYROIDAB in the last 72 hours. Anemia Panel: No results for input(s): VITAMINB12, FOLATE, FERRITIN, TIBC, IRON, RETICCTPCT in the last 72 hours. Urine analysis:    Component Value Date/Time   COLORURINE YELLOW 02/12/2018 2250   APPEARANCEUR CLEAR 02/12/2018 2250   LABSPEC 1.014 02/12/2018 2250   PHURINE 5.0 02/12/2018 2250   GLUCOSEU 50 (A) 02/12/2018 2250   HGBUR SMALL (A) 02/12/2018 2250   BILIRUBINUR NEGATIVE 02/12/2018 2250   KETONESUR 20 (A) 02/12/2018 2250   PROTEINUR NEGATIVE 02/12/2018 2250   UROBILINOGEN 0.2 02/11/2017 1601   NITRITE NEGATIVE 02/12/2018 2250   LEUKOCYTESUR NEGATIVE 02/12/2018 2250    Radiological Exams on Admission: DG Pelvis 1-2 Views  Result Date: 10/17/2020 CLINICAL DATA:  Fall with right hip pain EXAM: PELVIS - 1-2 VIEW COMPARISON:  01/16/2020 FINDINGS: Both femoral heads project in joint. Pubic symphysis is intact. Suspected acute fractures of the right superior and inferior pubic rami. There may be associated lucent lesion within the right inferior pubic ramus. IMPRESSION: Suspected acute fractures of the right superior and inferior pubic rami with possible associated lucent lesion in the right inferior pubic ramus. Electronically Signed   By: Jasmine Pang M.D.   On: 10/17/2020 17:46   DG Forearm Right  Result Date: 10/17/2020 CLINICAL DATA:  Right forearm pain after fall EXAM: RIGHT FOREARM - 2 VIEW COMPARISON:  None. FINDINGS: There is no evidence of fracture or other focal bone lesions. Soft tissues are unremarkable. IMPRESSION: Negative. Electronically Signed   By:  Duanne Guess D.O.   On: 10/17/2020 17:44   CT Head Wo Contrast  Result Date: 10/17/2020 CLINICAL DATA:  Fall.  Anticoagulated EXAM: CT HEAD WITHOUT CONTRAST TECHNIQUE: Contiguous axial images were obtained from the base of the skull through the vertex without intravenous contrast. COMPARISON:  None. FINDINGS: Brain: Diffuse cerebral atrophy. Ventricular dilatation consistent with central atrophy. Low-attenuation changes in the deep white matter consistent small vessel ischemic change. Old lacunar infarcts in the deep white matter. No mass effect  or midline shift. No abnormal extra-axial fluid collections. Gray-white matter junctions are distinct. Basal cisterns are not effaced. No acute intracranial hemorrhage. Basal ganglia calcifications. Vascular: Moderate intracranial arterial vascular calcifications. Skull: Calvarium appears intact. Sinuses/Orbits: Paranasal sinuses and mastoid air cells are clear. Other: None. IMPRESSION: 1. No acute intracranial abnormalities. 2. Chronic atrophy and small vessel ischemic changes. Old lacunar infarcts in the deep white matter. Electronically Signed   By: Burman Nieves M.D.   On: 10/17/2020 17:09   CT PELVIS WO CONTRAST  Result Date: 10/17/2020 CLINICAL DATA:  Fall. EXAM: CT PELVIS WITHOUT CONTRAST TECHNIQUE: Multidetector CT imaging of the pelvis was performed following the standard protocol without intravenous contrast. COMPARISON:  January 18, 2008. FINDINGS: Urinary Tract:  No abnormality visualized. Bowel:  Unremarkable visualized pelvic bowel loops. Vascular/Lymphatic: No pathologically enlarged lymph nodes. No significant vascular abnormality seen. Reproductive: Status post hysterectomy. No adnexal abnormality is noted. Other:  No hernia or abnormal fluid collection is noted. Musculoskeletal: Moderately displaced and comminuted fracture is seen involving the right inferior pubic ramus. There is a nondisplaced fracture involving the right superior pubic ramus at  its junction with the anterior acetabulum. IMPRESSION: 1. Moderately displaced and comminuted fracture is seen involving the right inferior pubic ramus. 2. Nondisplaced fracture involving the right superior pubic ramus at its junction with the anterior acetabulum. Electronically Signed   By: Lupita Raider M.D.   On: 10/17/2020 18:21   DG Knee Right Port  Result Date: 10/17/2020 CLINICAL DATA:  Fall with right knee pain EXAM: PORTABLE RIGHT KNEE - 1-2 VIEW COMPARISON:  None. FINDINGS: No acute displaced fracture or malalignment. Trace knee effusion. Mild tricompartment arthritis of the knee. IMPRESSION: No acute osseous abnormality. Electronically Signed   By: Jasmine Pang M.D.   On: 10/17/2020 19:55   DG Abd Portable 1V  Result Date: 10/17/2020 CLINICAL DATA:  Right knee pain EXAM: PORTABLE ABDOMEN - 1 VIEW COMPARISON:  None. FINDINGS: The bowel gas pattern is normal. No radio-opaque calculi or other significant radiographic abnormality are seen. IMPRESSION: Negative. Electronically Signed   By: Jonna Clark M.D.   On: 10/17/2020 19:57   DG Hand Complete Right  Result Date: 10/17/2020 CLINICAL DATA:  Right hand pain after fall EXAM: RIGHT HAND - COMPLETE 3+ VIEW COMPARISON:  None. FINDINGS: No acute fracture. No dislocation. Mild degenerative changes throughout the hand and wrist. No focal erosion. Soft tissues within normal limits. IMPRESSION: No acute findings. Mild degenerative changes. Electronically Signed   By: Duanne Guess D.O.   On: 10/17/2020 17:43   DG FEMUR, MIN 2 VIEWS RIGHT  Result Date: 10/17/2020 CLINICAL DATA:  Fall with right hip pain EXAM: RIGHT FEMUR 2 VIEWS COMPARISON:  01/16/2020 FINDINGS: No fracture or malalignment of the right femur. The right femoral head projects in joint. Suspected acute fractures involving the right superior and inferior pubic ramus. Questionable subtle lucent lesion in the right inferior pubic ramus. IMPRESSION: 1. Suspected acute fractures involving  the right superior and inferior pubic rami. 2. Possible lucent lesion in the right inferior pubic ramus, raising concern for pathologic fracture Electronically Signed   By: Jasmine Pang M.D.   On: 10/17/2020 17:45   I reviewed all nursing notes, pharmacy notes, vitals, pertinent old records.  Assessment/Plan 1. Closed fracture of multiple pubic rami, right, initial encounter (HCC) Mechanical fall at home. Presents with above complaint. Patient has prior history of mechanical recurrent falls as well.  Especially after her CVA. CT pelvis shows moderately displaced and  comminuted right inferior pubic rami fracture as well as right superior pubic rami fracture. Pain currently 9 out of 10. Not controlled with morphine. Per EDP orthopedic recommend admission for pain control but no intervention. We will get PT OT evaluation as well. Pain control with IV Dilaudid plus Percocet as well as scheduled Toradol. Continue Zanaflex and gabapentin as needed. Weightbearing as tolerated.  2.  Chronic A. fib On Eliquis. Currently on hold.  DVT prophylaxis with Lovenox. Rate controlled with Lopressor.  3.  Abdominal pain. Negative.  Pain resolved. Likely from urinary retention which resolved. Monitor.  4.  Dyslipidemia Continue Pravachol.  5.  Type II DM, uncontrolled with neuropathy. On Metformin at home. Check hemoglobin A1c. Continue sliding scale insulin.  6.  History of CVA. On chronic anticoagulation with Eliquis. Resume when appropriate.  Nutrition: Cardiac diet DVT Prophylaxis: Subcutaneous Lovenox  Advance goals of care discussion: Full code   Consults: EDP discussed with orthopedic.  Family Communication: family was present at bedside, at the time of interview.  Opportunity was given to ask question and all questions were answered satisfactorily.   Disposition:  From: Home Likely will need Home on discharge.   Author: Lynden Oxford, MD [facility] 10/17/2020 11:58  PM   To reach On-call, see care teams to locate the attending and reach out to them via [email or web address]. If 7PM-7AM, please contact night-coverage If you still have difficulty reaching the attending provider, please page the [facility] (Director on Call) for [facility] on amion for assistance.

## 2020-10-17 NOTE — ED Triage Notes (Signed)
EMS reports from home, Pt using walker and fell forward. C/o  pain to R hip and R ring finger. Abrasion to R hand, no other obvious injuries. No LOC, takes Eliquis.  BP 140/88 HR 98 RR 20 Sp02 94 RA

## 2020-10-17 NOTE — ED Notes (Signed)
Report called to [facility], RN on third floor

## 2020-10-17 NOTE — ED Notes (Signed)
Pure wick placed.

## 2020-10-18 DIAGNOSIS — Z79899 Other long term (current) drug therapy: Secondary | ICD-10-CM | POA: Diagnosis not present

## 2020-10-18 DIAGNOSIS — I10 Essential (primary) hypertension: Secondary | ICD-10-CM | POA: Diagnosis not present

## 2020-10-18 DIAGNOSIS — E781 Pure hyperglyceridemia: Secondary | ICD-10-CM | POA: Diagnosis present

## 2020-10-18 DIAGNOSIS — R350 Frequency of micturition: Secondary | ICD-10-CM | POA: Diagnosis not present

## 2020-10-18 DIAGNOSIS — W19XXXA Unspecified fall, initial encounter: Secondary | ICD-10-CM

## 2020-10-18 DIAGNOSIS — W010XXA Fall on same level from slipping, tripping and stumbling without subsequent striking against object, initial encounter: Secondary | ICD-10-CM | POA: Diagnosis present

## 2020-10-18 DIAGNOSIS — Y92018 Other place in single-family (private) house as the place of occurrence of the external cause: Secondary | ICD-10-CM | POA: Diagnosis not present

## 2020-10-18 DIAGNOSIS — Y92009 Unspecified place in unspecified non-institutional (private) residence as the place of occurrence of the external cause: Secondary | ICD-10-CM

## 2020-10-18 DIAGNOSIS — G4733 Obstructive sleep apnea (adult) (pediatric): Secondary | ICD-10-CM | POA: Diagnosis present

## 2020-10-18 DIAGNOSIS — S32591A Other specified fracture of right pubis, initial encounter for closed fracture: Secondary | ICD-10-CM | POA: Diagnosis present

## 2020-10-18 DIAGNOSIS — K219 Gastro-esophageal reflux disease without esophagitis: Secondary | ICD-10-CM | POA: Diagnosis present

## 2020-10-18 DIAGNOSIS — I69351 Hemiplegia and hemiparesis following cerebral infarction affecting right dominant side: Secondary | ICD-10-CM | POA: Diagnosis not present

## 2020-10-18 DIAGNOSIS — S72009A Fracture of unspecified part of neck of unspecified femur, initial encounter for closed fracture: Secondary | ICD-10-CM | POA: Diagnosis present

## 2020-10-18 DIAGNOSIS — Z7984 Long term (current) use of oral hypoglycemic drugs: Secondary | ICD-10-CM | POA: Diagnosis not present

## 2020-10-18 DIAGNOSIS — I208 Other forms of angina pectoris: Secondary | ICD-10-CM | POA: Diagnosis not present

## 2020-10-18 DIAGNOSIS — Z8701 Personal history of pneumonia (recurrent): Secondary | ICD-10-CM | POA: Diagnosis not present

## 2020-10-18 DIAGNOSIS — S60221A Contusion of right hand, initial encounter: Secondary | ICD-10-CM | POA: Diagnosis present

## 2020-10-18 DIAGNOSIS — K5900 Constipation, unspecified: Secondary | ICD-10-CM | POA: Diagnosis not present

## 2020-10-18 DIAGNOSIS — S32501D Unspecified fracture of right pubis, subsequent encounter for fracture with routine healing: Secondary | ICD-10-CM | POA: Diagnosis not present

## 2020-10-18 DIAGNOSIS — E1165 Type 2 diabetes mellitus with hyperglycemia: Secondary | ICD-10-CM | POA: Diagnosis present

## 2020-10-18 DIAGNOSIS — M549 Dorsalgia, unspecified: Secondary | ICD-10-CM | POA: Diagnosis present

## 2020-10-18 DIAGNOSIS — Z90710 Acquired absence of both cervix and uterus: Secondary | ICD-10-CM | POA: Diagnosis not present

## 2020-10-18 DIAGNOSIS — S5011XA Contusion of right forearm, initial encounter: Secondary | ICD-10-CM | POA: Diagnosis present

## 2020-10-18 DIAGNOSIS — R031 Nonspecific low blood-pressure reading: Secondary | ICD-10-CM | POA: Diagnosis not present

## 2020-10-18 DIAGNOSIS — Z20822 Contact with and (suspected) exposure to covid-19: Secondary | ICD-10-CM | POA: Diagnosis present

## 2020-10-18 DIAGNOSIS — Z7901 Long term (current) use of anticoagulants: Secondary | ICD-10-CM | POA: Diagnosis not present

## 2020-10-18 DIAGNOSIS — S32591D Other specified fracture of right pubis, subsequent encounter for fracture with routine healing: Secondary | ICD-10-CM | POA: Diagnosis not present

## 2020-10-18 DIAGNOSIS — R072 Precordial pain: Secondary | ICD-10-CM | POA: Diagnosis not present

## 2020-10-18 DIAGNOSIS — I482 Chronic atrial fibrillation, unspecified: Secondary | ICD-10-CM

## 2020-10-18 DIAGNOSIS — E119 Type 2 diabetes mellitus without complications: Secondary | ICD-10-CM | POA: Diagnosis not present

## 2020-10-18 DIAGNOSIS — S60511A Abrasion of right hand, initial encounter: Secondary | ICD-10-CM | POA: Diagnosis present

## 2020-10-18 DIAGNOSIS — R079 Chest pain, unspecified: Secondary | ICD-10-CM | POA: Diagnosis not present

## 2020-10-18 DIAGNOSIS — R339 Retention of urine, unspecified: Secondary | ICD-10-CM | POA: Diagnosis present

## 2020-10-18 DIAGNOSIS — I4821 Permanent atrial fibrillation: Secondary | ICD-10-CM | POA: Diagnosis not present

## 2020-10-18 DIAGNOSIS — G8929 Other chronic pain: Secondary | ICD-10-CM | POA: Diagnosis present

## 2020-10-18 DIAGNOSIS — G8918 Other acute postprocedural pain: Secondary | ICD-10-CM | POA: Diagnosis not present

## 2020-10-18 DIAGNOSIS — S32810S Multiple fractures of pelvis with stable disruption of pelvic ring, sequela: Secondary | ICD-10-CM | POA: Diagnosis not present

## 2020-10-18 DIAGNOSIS — E1140 Type 2 diabetes mellitus with diabetic neuropathy, unspecified: Secondary | ICD-10-CM | POA: Diagnosis present

## 2020-10-18 DIAGNOSIS — R296 Repeated falls: Secondary | ICD-10-CM | POA: Diagnosis present

## 2020-10-18 DIAGNOSIS — I69354 Hemiplegia and hemiparesis following cerebral infarction affecting left non-dominant side: Secondary | ICD-10-CM | POA: Diagnosis not present

## 2020-10-18 DIAGNOSIS — E785 Hyperlipidemia, unspecified: Secondary | ICD-10-CM | POA: Diagnosis present

## 2020-10-18 LAB — HEMOGLOBIN A1C
Hgb A1c MFr Bld: 6.5 % — ABNORMAL HIGH (ref 4.8–5.6)
Hgb A1c MFr Bld: 6.4 % — ABNORMAL HIGH (ref 4.8–5.6)
Mean Plasma Glucose: 139.85 mg/dL
Mean Plasma Glucose: 136.98 mg/dL

## 2020-10-18 LAB — GLUCOSE, CAPILLARY
Glucose-Capillary: 146 mg/dL — ABNORMAL HIGH (ref 70–99)
Glucose-Capillary: 194 mg/dL — ABNORMAL HIGH (ref 70–99)
Glucose-Capillary: 149 mg/dL — ABNORMAL HIGH (ref 70–99)
Glucose-Capillary: 184 mg/dL — ABNORMAL HIGH (ref 70–99)

## 2020-10-18 MED ORDER — SODIUM CHLORIDE 0.9 % IV BOLUS
500.0000 mL | Freq: Once | INTRAVENOUS | Status: AC
  Administered 2020-10-18: 500 mL via INTRAVENOUS

## 2020-10-18 MED ORDER — SODIUM CHLORIDE 0.9 % IV SOLN
INTRAVENOUS | Status: DC | PRN
  Administered 2020-10-19: 1000 mL via INTRAVENOUS

## 2020-10-18 MED ORDER — ONDANSETRON HCL 4 MG/2ML IJ SOLN
4.0000 mg | Freq: Four times a day (QID) | INTRAMUSCULAR | Status: DC | PRN
  Administered 2020-10-18: 4 mg via INTRAVENOUS
  Filled 2020-10-18: qty 2

## 2020-10-18 MED ORDER — APIXABAN 5 MG PO TABS
5.0000 mg | ORAL_TABLET | Freq: Two times a day (BID) | ORAL | Status: DC
  Administered 2020-10-18 – 2020-10-20 (×4): 5 mg via ORAL
  Filled 2020-10-18 (×4): qty 1

## 2020-10-18 NOTE — Evaluation (Signed)
Occupational Therapy Evaluation Patient Details Name: Genea Rheaume MRN: [id number] DOB: 1941-06-26 Today's Date: 10/18/2020    History of Present Illness Zemira Rumsey is a 80 y.o. female with Past medical history of chronic A. 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  Patient presents s/p mechanical fall and found to have right sided pubic rami fracture   Clinical Impression   Ms. Orvella Zaborowski is a 80 year old woman who presents supine in bed and agreeable to therapy. Patient's daughter used as Equities trader. Patient typically ambulates with RW, modified independent with BADLs, and lives with daughter but doesn't require 24/7 supervision. On evaluation patient presents with pain, generalized weakness, decreased activity tolerance and impaired balance resulting in significant decline in ability to perform functional mobility and ADLs. Patient max assist to transfer into sitting, mod assist to stand and take steps with RW, only able to get to recliner. Reports pain and spasms in right lower extremity. Daughter reports patient has residual left sided weakness from stroke and has a tendency to drag her left foot. Patient set up for UB ADLs and max-total assist for LB ADLs and toileting. Patient will benefit from skilled OT services while in hospital to improve deficits and learn compensatory strategies as needed in order to improve functional abilities. Patient needs short term rehab at discharge.       Follow Up Recommendations  SNF    Equipment Recommendations  None recommended by OT    Recommendations for Other Services       Precautions / Restrictions Precautions Precautions: Fall Restrictions Weight Bearing Restrictions: No      Mobility Bed Mobility Overal bed mobility: Needs Assistance Bed Mobility: Rolling;Sidelying to  Sit Rolling: Mod assist Sidelying to sit: Max assist;HOB elevated       General bed mobility comments: Assistance needed for rolling to left side, max assist for LEs and trunk negotiation. Totalerated EOB sitting.    Transfers Overall transfer level: Needs assistance Equipment used: Rolling walker (2 wheeled) Transfers: Stand Pivot Transfers;Sit to/from Stand Sit to Stand: Mod assist Stand pivot transfers: Mod assist       General transfer comment: Mod assist to rise from bed, Mod assist for tactile cues for weight shift and walker management to take take steps to recliner. Pain limiting movement.    Balance Overall balance assessment: Needs assistance Sitting-balance support: No upper extremity supported;Feet supported Sitting balance-Leahy Scale: Fair     Standing balance support: Bilateral upper extremity supported;During functional activity Standing balance-Leahy Scale: Poor                             ADL either performed or assessed with clinical judgement   ADL Overall ADL's : Needs assistance/impaired Eating/Feeding: Set up;Sitting   Grooming: Set up;Sitting   Upper Body Bathing: Set up;Sitting   Lower Body Bathing: Maximal assistance;Sit to/from stand   Upper Body Dressing : Set up;Sitting   Lower Body Dressing: Total assistance;Sit to/from stand   Toilet Transfer: Moderate assistance;BSC;Stand-pivot;RW Toilet Transfer Details (indicate cue type and reason): as demonstrated from recliner transfer Toileting- Clothing Manipulation and Hygiene: Total assistance;Sit to/from stand       Functional mobility during ADLs: Moderate assistance;Rolling walker       Vision Patient Visual Report: No change from baseline  Perception     Praxis      Pertinent Vitals/Pain Pain Assessment: Faces Faces Pain Scale: Hurts even more Pain Location: R thigh Pain Descriptors / Indicators: Aching;Spasm;Discomfort;Grimacing;Guarding Pain  Intervention(s): Limited activity within patient's tolerance;Premedicated before session     Hand Dominance Right   Extremity/Trunk Assessment Upper Extremity Assessment Upper Extremity Assessment: Overall WFL for tasks assessed (Hx of stroke, reports left sided weakness.)   Lower Extremity Assessment Lower Extremity Assessment: Defer to PT evaluation   Cervical / Trunk Assessment Cervical / Trunk Assessment: Normal   Communication Communication Communication: Prefers language other than English   Cognition Arousal/Alertness: Awake/alert Behavior During Therapy: WFL for tasks assessed/performed Overall Cognitive Status: Within Functional Limits for tasks assessed                                     General Comments       Exercises     Shoulder Instructions      Home Living Family/patient expects to be discharged to:: Private residence Living Arrangements: Children Available Help at Discharge: Available PRN/intermittently;Family Type of Home: House Home Access: Stairs to enter [employer] of Steps: 2   Home Layout: Two level;Able to live on main level with bedroom/bathroom     Bathroom Shower/Tub: Teacher, early years/pre: Standard     Home Equipment: Environmental consultant - 2 wheels;Walker - 4 wheels;Bedside commode;Tub bench          Prior Functioning/Environment Level of Independence: Independent with assistive device(s);Needs assistance  Gait / Transfers Assistance Needed: Uses RW predominantly, Rolator outside of bed ADL's / Homemaking Assistance Needed: Independent with ADLs, tub bench for bathing            OT Problem List: Decreased strength;Decreased range of motion;Decreased activity tolerance;Impaired balance (sitting and/or standing);Decreased knowledge of use of DME or AE;Pain      OT Treatment/Interventions: Self-care/ADL training;Therapeutic exercise;DME and/or AE instruction;Therapeutic activities;Balance  training;Patient/family education    OT Goals(Current goals can be found in the care plan section) Acute Rehab OT Goals Patient Stated Goal: to get stronger to go home after rehab OT Goal Formulation: With patient/family Time For Goal Achievement: 11/01/20 Potential to Achieve Goals: Good  OT Frequency: Min 2X/week   Barriers to D/C: Decreased caregiver support  Daughter reports working part time and having 4 children       Co-evaluation              AM-PAC OT "6 Clicks" Daily Activity     Outcome Measure Help from another person eating meals?: A Little Help from another person taking care of personal grooming?: A Little Help from another person toileting, which includes using toliet, bedpan, or urinal?: Total Help from another person bathing (including washing, rinsing, drying)?: A Lot Help from another person to put on and taking off regular upper body clothing?: A Little Help from another person to put on and taking off regular lower body clothing?: Total 6 Click Score: 13   End of Session Equipment Utilized During Treatment: Gait belt;Rolling walker Nurse Communication: Mobility status  Activity Tolerance: Patient limited by pain Patient left: in chair;with call bell/phone within reach;with chair alarm set;with family/visitor present  OT Visit Diagnosis: Unsteadiness on feet (R26.81);Other abnormalities of gait and mobility (R26.89);History of falling (Z91.81);Muscle weakness (generalized) (M62.81);Pain Pain - Right/Left: Right Pain - part of body: Hip  Time: 0263-7858 OT Time Calculation (min): 21 min Charges:  OT General Charges $OT Visit: 1 Visit OT Evaluation $OT Eval Moderate Complexity: 1 Mod  Osceola Holian, OTR/L Acute Care Rehab Services  Office [phone] Pager: [phone]   Kelli Churn 10/18/2020, 10:52 AM

## 2020-10-18 NOTE — Plan of Care (Signed)
  Problem: Activity: Goal: Ability to ambulate and perform ADLs will improve Outcome: Progressing   Problem: Pain Management: Goal: Pain level will decrease Outcome: Progressing   

## 2020-10-18 NOTE — Plan of Care (Signed)
  Problem: Pain Managment: Goal: General experience of comfort will improve Outcome: Progressing   Problem: Safety: Goal: Ability to remain free from injury will improve Outcome: Progressing   Problem: Activity: Goal: Ability to ambulate and perform ADLs will improve Outcome: Progressing   Problem: Education: Goal: Verbalization of understanding the information provided (i.e., activity precautions, restrictions, etc) will improve Outcome: Progressing

## 2020-10-18 NOTE — Consult Note (Cosign Needed Addendum)
Reason for Consult:R pubic rami fractures  Referring Physician:   [name] is an 80 y.o. female vietnamese speaking, daughters number was available at bedside and was able to interpret for reason of this consult and care planning.  HPI: [name] is a 80 y.o. female with Past medical history of chronic A. fib, chronic back pain, GERD, recurrent fall uses a Rollator to walk, OSA on CPAP, HLD, type II DM, chronic anticoagulation, history of SDH after a fall on Coumadin, history of CVA 4 years ago, T12 compression fracture. Patient presented with a mechanical fall. 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  The Rollator that she used to walk around moved foster and she fell on her right side.  No passing out event, no dizziness or lightheadedness.  Denies having any head or neck injury. 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 She has chronic back pain for which she takes Norco and gabapentin as needed. She also takes as needed Zanaflex. She also reports some abdominal pain which is diffuse in nature.  This resolved on reevaluation after an hour.  Past Medical History:  Diagnosis Date  . Atrial fibrillation (HCC)   . Chronic back pain    "mid back down into lower back" (08/25/2014)  . Frequent falls   . GERD (gastroesophageal reflux disease)   . Hypertriglyceridemia   . OSA on CPAP   . Osteoarthritis    "knees, hands, back" (08/25/2014)  . Pneumonia ~ 2000 X 1  . Subdural hematoma (HCC) july 2015   S/P fall while on Coumadin  . T12 compression fracture (HCC) 2012  . Type II diabetes mellitus (HCC)     Past Surgical History:  Procedure Laterality Date  . APPENDECTOMY  2012  . CATARACT EXTRACTION W/ INTRAOCULAR LENS  IMPLANT, BILATERAL Bilateral 2000's  . IR GENERIC HISTORICAL  10/15/2016   IR PERCUTANEOUS ART THROMBECTOMY/INFUSION INTRACRANIAL INC DIAG ANGIO 10/15/2016 [name], MD [facility]  . IR GENERIC HISTORICAL  12/13/2016   IR RADIOLOGIST EVAL &  MGMT 12/13/2016 [facility]  . RADIOLOGY WITH ANESTHESIA N/A 10/15/2016   Procedure: RADIOLOGY WITH ANESTHESIA;  Surgeon: [name], MD;  Location: [facility] OR;  Service: Radiology;  Laterality: N/A;  . TOTAL ABDOMINAL HYSTERECTOMY  1990    Family History  Problem Relation Age of Onset  . Diabetes Mellitus II Neg Hx   . Hypertension Neg Hx     Social History:  reports that she is a non-smoker but has been exposed to tobacco smoke. She has never used smokeless tobacco. She reports that she does not drink alcohol and does not use drugs.  Allergies:  Allergies  Allergen Reactions  . Lactose Intolerance (Gi) Diarrhea  . Banana Swelling    Hands and feet  . Peanut-Containing Drug Products Itching    Throat itches, no swelling  . Chocolate Itching    Medications: I have reviewed the patient's current medications.  Results for orders placed or performed during the hospital encounter of 10/17/20 (from the past 48 hour(s))  CBC with Differential/Platelet     Status: Abnormal   Collection Time: 10/17/20  6:24 PM  Result Value Ref Range   WBC 12.1 (H) 4.0 - 10.5 K/uL   RBC 4.02 3.87 - 5.11 MIL/uL   Hemoglobin 13.3 12.0 - 15.0 g/dL   HCT 46.2 86.3 - 81.7 %   MCV 100.7 (H) 80.0 - 100.0 fL   MCH 33.1 26.0 - 34.0 pg   MCHC  32.8 30.0 - 36.0 g/dL   RDW 78.9 38.1 - 01.7 %   Platelets 301 150 - 400 K/uL   nRBC 0.0 0.0 - 0.2 %   Neutrophils Relative % 80 %   Neutro Abs 9.7 (H) 1.7 - 7.7 K/uL   Lymphocytes Relative 12 %   Lymphs Abs 1.5 0.7 - 4.0 K/uL   Monocytes Relative 6 %   Monocytes Absolute 0.7 0.1 - 1.0 K/uL   Eosinophils Relative 0 %   Eosinophils Absolute 0.0 0.0 - 0.5 K/uL   Basophils Relative 1 %   Basophils Absolute 0.1 0.0 - 0.1 K/uL   Immature Granulocytes 1 %   Abs Immature Granulocytes 0.14 (H) 0.00 - 0.07 K/uL    Comment: Performed at [facility], [address], [place], [place] [address]  Comprehensive metabolic panel     Status: Abnormal    Collection Time: 10/17/20  6:24 PM  Result Value Ref Range   Sodium 142 135 - 145 mmol/L   Potassium 4.0 3.5 - 5.1 mmol/L   Chloride 103 98 - 111 mmol/L   CO2 27 22 - 32 mmol/L   Glucose, Bld 138 (H) 70 - 99 mg/dL    Comment: Glucose reference range applies only to samples taken after fasting for at least 8 hours.   BUN 17 8 - 23 mg/dL   Creatinine, Ser 8.52 0.44 - 1.00 mg/dL   Calcium 9.7 8.9 - 77.8 mg/dL   Total Protein 7.5 6.5 - 8.1 g/dL   Albumin 4.7 3.5 - 5.0 g/dL   AST 25 15 - 41 U/L   ALT 19 0 - 44 U/L   Alkaline Phosphatase 39 38 - 126 U/L   Total Bilirubin 0.6 0.3 - 1.2 mg/dL   GFR, Estimated >24 >23 mL/min    Comment: (NOTE) Calculated using the CKD-EPI Creatinine Equation (2021)    Anion gap 12 5 - 15    Comment: Performed at [facility], [address], [place], [place] [address]  Resp Panel by RT-PCR (Flu A&B, Covid) Nasopharyngeal Swab     Status: None   Collection Time: 10/17/20  6:38 PM   Specimen: Nasopharyngeal Swab; Nasopharyngeal(NP) swabs in vial transport medium  Result Value Ref Range   SARS Coronavirus 2 by RT PCR NEGATIVE NEGATIVE    Comment: (NOTE) SARS-CoV-2 target nucleic acids are NOT DETECTED.  The SARS-CoV-2 RNA is generally detectable in upper respiratory specimens during the acute phase of infection. The lowest concentration of SARS-CoV-2 viral copies this assay can detect is 138 copies/mL. A negative result does not preclude SARS-Cov-2 infection and should not be used as the sole basis for treatment or other patient management decisions. A negative result may occur with  improper specimen collection/handling, submission of specimen other than nasopharyngeal swab, presence of viral mutation(s) within the areas targeted by this assay, and inadequate number of viral copies(<138 copies/mL). A negative result must be combined with clinical observations, patient history, and epidemiological information. The expected result is  Negative.  Fact Sheet for Patients:  BloggerCourse.com  Fact Sheet for Healthcare Providers:  SeriousBroker.it  This test is no t yet approved or cleared by the [place] FDA and  has been authorized for detection and/or diagnosis of SARS-CoV-2 by FDA under an Emergency Use Authorization (EUA). This EUA will remain  in effect (meaning this test can be used) for the duration of the COVID-19 declaration under Section 564(b)(1) of the Act, 21 U.S.C.section 360bbb-3(b)(1), unless the authorization is  terminated  or revoked sooner.       Influenza A by PCR NEGATIVE NEGATIVE   Influenza B by PCR NEGATIVE NEGATIVE    Comment: (NOTE) The Xpert Xpress SARS-CoV-2/FLU/RSV plus assay is intended as an aid in the diagnosis of influenza from Nasopharyngeal swab specimens and should not be used as a sole basis for treatment. Nasal washings and aspirates are unacceptable for Xpert Xpress SARS-CoV-2/FLU/RSV testing.  Fact Sheet for Patients: BloggerCourse.com  Fact Sheet for Healthcare Providers: SeriousBroker.it  This test is not yet approved or cleared by the [place] FDA and has been authorized for detection and/or diagnosis of SARS-CoV-2 by FDA under an Emergency Use Authorization (EUA). This EUA will remain in effect (meaning this test can be used) for the duration of the COVID-19 declaration under Section 564(b)(1) of the Act, 21 U.S.C. section 360bbb-3(b)(1), unless the authorization is terminated or revoked.  Performed at [facility], [address] [place], [place] [address]   Glucose, capillary     Status: Abnormal   Collection Time: 10/17/20 10:49 PM  Result Value Ref Range   Glucose-Capillary 171 (H) 70 - 99 mg/dL    Comment: Glucose reference range applies only to samples taken after fasting for at least 8 hours.  Hemoglobin A1c     Status:  Abnormal   Collection Time: 10/18/20  6:38 AM  Result Value Ref Range   Hgb A1c MFr Bld 6.4 (H) 4.8 - 5.6 %    Comment: (NOTE) Pre diabetes:          5.7%-6.4%  Diabetes:              >6.4%  Glycemic control for   <7.0% adults with diabetes    Mean Plasma Glucose 136.98 mg/dL    Comment: Performed at [facility], [address] [place], [place] [address]  Glucose, capillary     Status: Abnormal   Collection Time: 10/18/20  7:31 AM  Result Value Ref Range   Glucose-Capillary 146 (H) 70 - 99 mg/dL    Comment: Glucose reference range applies only to samples taken after fasting for at least 8 hours.    DG Pelvis 1-2 Views  Result Date: 10/17/2020 CLINICAL DATA:  Fall with right hip pain EXAM: PELVIS - 1-2 VIEW COMPARISON:  01/16/2020 FINDINGS: Both femoral heads project in joint. Pubic symphysis is intact. Suspected acute fractures of the right superior and inferior pubic rami. There may be associated lucent lesion within the right inferior pubic ramus. IMPRESSION: Suspected acute fractures of the right superior and inferior pubic rami with possible associated lucent lesion in the right inferior pubic ramus. Electronically Signed   By: Jasmine Pang M.D.   On: 10/17/2020 17:46   DG Forearm Right  Result Date: 10/17/2020 CLINICAL DATA:  Right forearm pain after fall EXAM: RIGHT FOREARM - 2 VIEW COMPARISON:  None. FINDINGS: There is no evidence of fracture or other focal bone lesions. Soft tissues are unremarkable. IMPRESSION: Negative. Electronically Signed   By: Duanne Guess D.O.   On: 10/17/2020 17:44   CT Head Wo Contrast  Result Date: 10/17/2020 CLINICAL DATA:  Fall.  Anticoagulated EXAM: CT HEAD WITHOUT CONTRAST TECHNIQUE: Contiguous axial images were obtained from the base of the skull through the vertex without intravenous contrast. COMPARISON:  None. FINDINGS: Brain: Diffuse cerebral atrophy. Ventricular dilatation consistent with central atrophy. Low-attenuation changes in  the deep white matter consistent small vessel ischemic change. Old lacunar infarcts in the deep white matter.  No mass effect or midline shift. No abnormal extra-axial fluid collections. Gray-white matter junctions are distinct. Basal cisterns are not effaced. No acute intracranial hemorrhage. Basal ganglia calcifications. Vascular: Moderate intracranial arterial vascular calcifications. Skull: Calvarium appears intact. Sinuses/Orbits: Paranasal sinuses and mastoid air cells are clear. Other: None. IMPRESSION: 1. No acute intracranial abnormalities. 2. Chronic atrophy and small vessel ischemic changes. Old lacunar infarcts in the deep white matter. Electronically Signed   By: Burman Nieves M.D.   On: 10/17/2020 17:09   CT PELVIS WO CONTRAST  Result Date: 10/17/2020 CLINICAL DATA:  Fall. EXAM: CT PELVIS WITHOUT CONTRAST TECHNIQUE: Multidetector CT imaging of the pelvis was performed following the standard protocol without intravenous contrast. COMPARISON:  January 18, 2008. FINDINGS: Urinary Tract:  No abnormality visualized. Bowel:  Unremarkable visualized pelvic bowel loops. Vascular/Lymphatic: No pathologically enlarged lymph nodes. No significant vascular abnormality seen. Reproductive: Status post hysterectomy. No adnexal abnormality is noted. Other:  No hernia or abnormal fluid collection is noted. Musculoskeletal: Moderately displaced and comminuted fracture is seen involving the right inferior pubic ramus. There is a nondisplaced fracture involving the right superior pubic ramus at its junction with the anterior acetabulum. IMPRESSION: 1. Moderately displaced and comminuted fracture is seen involving the right inferior pubic ramus. 2. Nondisplaced fracture involving the right superior pubic ramus at its junction with the anterior acetabulum. Electronically Signed   By: Lupita Raider M.D.   On: 10/17/2020 18:21   DG Knee Right Port  Result Date: 10/17/2020 CLINICAL DATA:  Fall with right knee pain EXAM:  PORTABLE RIGHT KNEE - 1-2 VIEW COMPARISON:  None. FINDINGS: No acute displaced fracture or malalignment. Trace knee effusion. Mild tricompartment arthritis of the knee. IMPRESSION: No acute osseous abnormality. Electronically Signed   By: Jasmine Pang M.D.   On: 10/17/2020 19:55   DG Abd Portable 1V  Result Date: 10/17/2020 CLINICAL DATA:  Right knee pain EXAM: PORTABLE ABDOMEN - 1 VIEW COMPARISON:  None. FINDINGS: The bowel gas pattern is normal. No radio-opaque calculi or other significant radiographic abnormality are seen. IMPRESSION: Negative. Electronically Signed   By: Jonna Clark M.D.   On: 10/17/2020 19:57   DG Hand Complete Right  Result Date: 10/17/2020 CLINICAL DATA:  Right hand pain after fall EXAM: RIGHT HAND - COMPLETE 3+ VIEW COMPARISON:  None. FINDINGS: No acute fracture. No dislocation. Mild degenerative changes throughout the hand and wrist. No focal erosion. Soft tissues within normal limits. IMPRESSION: No acute findings. Mild degenerative changes. Electronically Signed   By: Duanne Guess D.O.   On: 10/17/2020 17:43   DG FEMUR, MIN 2 VIEWS RIGHT  Result Date: 10/17/2020 CLINICAL DATA:  Fall with right hip pain EXAM: RIGHT FEMUR 2 VIEWS COMPARISON:  01/16/2020 FINDINGS: No fracture or malalignment of the right femur. The right femoral head projects in joint. Suspected acute fractures involving the right superior and inferior pubic ramus. Questionable subtle lucent lesion in the right inferior pubic ramus. IMPRESSION: 1. Suspected acute fractures involving the right superior and inferior pubic rami. 2. Possible lucent lesion in the right inferior pubic ramus, raising concern for pathologic fracture Electronically Signed   By: Jasmine Pang M.D.   On: 10/17/2020 17:45    Review of Systems  Respiratory: Negative for chest tightness and shortness of breath.   Cardiovascular: Positive for leg swelling.  Gastrointestinal: Negative for constipation, diarrhea, nausea and vomiting.   Genitourinary: Negative for dysuria.  Neurological: Negative for light-headedness and headaches.  All other systems reviewed and are negative.  Blood pressure 99/60, pulse 79, temperature 98.6 F (37 C), resp. rate 18, height 4\' 10"  (1.473 m), weight 64.7 kg, SpO2 90 %. Physical Exam Constitutional:      General: She is not in acute distress. HENT:     Head: Normocephalic and atraumatic.  Eyes:     Extraocular Movements: Extraocular movements intact.  Cardiovascular:     Rate and Rhythm: Normal rate.     Pulses: Normal pulses.  Pulmonary:     Effort: Pulmonary effort is normal. No respiratory distress.  Abdominal:     General: Abdomen is flat. There is no distension.     Tenderness: There is no abdominal tenderness.  Musculoskeletal:     Cervical back: Normal range of motion. No tenderness.     Comments: RLE NVI, pedal edema bilat, no calf TTP, painful PROM R hip/pelvis, R groin pain, intact DF/PF ankle.  Skin:    General: Skin is warm and dry.     Findings: No erythema.  Neurological:     General: No focal deficit present.     Mental Status: She is alert.  Psychiatric:        Mood and Affect: Mood normal.        Behavior: Behavior normal.     Assessment/Plan: R superior and inferior pubic rami fractures nondisplaced.  She may WBAT RLE recommend continued use of walker.  Daughter states she has a lot of left sided weakness even prior to fall from previous CVA to the point where she drags her left leg and is obviously concerned for ambulation now with the right sided pelvic fractures.  Mobility is the biggest question right now.  PT eval recommending CIR currently, may need to consider this vs home with HHPT, would prefer to avoid SNF and so would daughter who she lives with and is able to provide some assistance just not max assist/lifting.    Pain control per daughter she is on a minimum amount of hydrocodone at home which she doesn't even take daily for chronic LBP, may  need oxycodone rx q4hrs for the first couple weeks.   Already on eliquis which will cover for dvt proph.    Follow-up Dr. Mardelle Matte 1-2 weeks.  Daughter also had some questions regarding osteoporosis which we could possibly set up with Dr. Layne Benton in our outpatient office as well.    [name] 10/18/2020, 10:51 AM

## 2020-10-18 NOTE — Progress Notes (Signed)
Discussed with EDP.  Patient with right pubic ramus fracture.  Plan WBAT and followup with me as an outpatient in 1-2 weeks.  Full consult to follow.  No surgery indicated.  Appreciate TRH management.   Eulas Post, MD

## 2020-10-18 NOTE — Evaluation (Signed)
Physical Therapy Evaluation Patient Details Name: [name] MRN: [id number] DOB: [birth year] Today's Date: 10/18/2020   History of Present Illness  80 y.o. female with Past medical history of chronic A. 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  Patient presents s/p mechanical fall and found to have right sided pubic rami fracture  Clinical Impression  On eval, pt required Min assist +2 safety/equipment for mobility. She walked ~7 feet with use of a RW. Ambulation distance was limited by weakness, pain. Pt presents with general weakness, decreased activity tolerance, and impaired gait and balance. Mobility is further complicated by hx of CVA with L residual weakness. Moderate pain with activity. Discussed d/c plan with daughter who was present and assisted with translation as well. Daughter would like a CIR consult to be placed. Feel pt could possibly be a good candidate. She is motivated and puts forth very good effort. If CIR is not an option, will likely need to maximize home health services.     Follow Up Recommendations CIR    Equipment Recommendations  None recommended by PT    Recommendations for Other Services Rehab consult     Precautions / Restrictions Precautions Precautions: Fall Restrictions Weight Bearing Restrictions: No Other Position/Activity Restrictions: WBAT      Mobility  Bed Mobility Overal bed mobility: Needs Assistance Bed Mobility: Rolling;Sidelying to Sit         General bed mobility comments: oob in recliner    Transfers Overall transfer level: Needs assistance Equipment used: Rolling walker (2 wheeled) Transfers: Sit to/from Stand Sit to Stand: Min assist        General transfer comment: Assist to rise, stabilize, control descent. Multimodal cueing for safety, hand placement.  Increased time.  Ambulation/Gait Ambulation/Gait assistance: Min assist;+2 safety/equipment Gait Distance (Feet): 7 Feet Assistive device: Rolling walker (2 wheeled) Gait Pattern/deviations: Step-to pattern;Trunk flexed;Antalgic     General Gait Details: Assist to stabilize pt, advance L LE intermittently, manage RW, follow with recliner. Cues for safety, technique, sequence, proper use of RW. Distance limited by pain, weakness.  Stairs            Wheelchair Mobility    Modified Rankin (Stroke Patients Only)       Balance Overall balance assessment: Needs assistance;History of Falls Sitting-balance support: No upper extremity supported;Feet supported Sitting balance-Leahy Scale: Fair     Standing balance support: Bilateral upper extremity supported Standing balance-Leahy Scale: Poor                               Pertinent Vitals/Pain Pain Assessment: Faces Faces Pain Scale: Hurts even more Pain Location: R pelvis/thigh Pain Descriptors / Indicators: Aching;Spasm;Discomfort;Grimacing;Guarding Pain Intervention(s): Limited activity within patient's tolerance;Monitored during session;Repositioned;Ice applied    Home Living Family/patient expects to be discharged to:: Private residence Living Arrangements: Children Available Help at Discharge: Available PRN/intermittently;Family Type of Home: House Home Access: Stairs to enter   [employer] of Steps: 2 Home Layout: Two level;Able to live on main level with bedroom/bathroom Home Equipment: Dan Humphreys - 2 wheels;Walker - 4 wheels;Bedside commode;Tub bench      Prior Function Level of Independence: Independent with assistive device(s);Needs assistance   Gait / Transfers Assistance Needed: Uses RW predominantly, Rollator outside of home  ADL's / Homemaking Assistance Needed:  Independent with ADLs, tub bench for bathing        Hand Dominance   Dominant Hand: Right    Extremity/Trunk  Assessment   Upper Extremity Assessment Upper Extremity Assessment: Defer to OT evaluation    Lower Extremity Assessment Lower Extremity Assessment: LLE deficits/detail LLE Deficits / Details: hip flex at least 2/5, knee ext at least 3/5, DF at least 2/5 LLE Sensation: decreased proprioception LLE Coordination: decreased fine motor;decreased gross motor    Cervical / Trunk Assessment Cervical / Trunk Assessment: Normal  Communication   Communication: Prefers language other than English  Cognition Arousal/Alertness: Awake/alert Behavior During Therapy: WFL for tasks assessed/performed Overall Cognitive Status: Difficult to assess                                 General Comments: appears [facility]      General Comments      Exercises     Assessment/Plan    PT Assessment Patient needs continued PT services  PT Problem List Decreased strength;Decreased mobility;Decreased activity tolerance;Decreased range of motion;Decreased balance;Decreased knowledge of use of DME;Pain       PT Treatment Interventions DME instruction;Gait training;Therapeutic activities;Therapeutic exercise;Patient/family education;Balance training;Functional mobility training    PT Goals (Current goals can be found in the Care Plan section)  Acute Rehab PT Goals Patient Stated Goal: to get stronger to go home after rehab PT Goal Formulation: With patient/family Time For Goal Achievement: 11/01/20 Potential to Achieve Goals: Good    Frequency Min 3X/week   Barriers to discharge        Co-evaluation               AM-PAC PT "6 Clicks" Mobility  Outcome Measure Help needed turning from your back to your side while in a flat bed without using bedrails?: A Lot Help needed moving from lying on your back to sitting on the side of a flat bed without using bedrails?: A Lot Help needed moving to and from a bed to a chair (including a wheelchair)?: A Little Help needed standing up from a  chair using your arms (e.g., wheelchair or bedside chair)?: A Little Help needed to walk in hospital room?: A Lot Help needed climbing 3-5 steps with a railing? : Total 6 Click Score: 13    End of Session Equipment Utilized During Treatment: Gait belt Activity Tolerance: Patient limited by fatigue;Patient limited by pain Patient left: in chair;with call bell/phone within reach;with family/visitor present   PT Visit Diagnosis: Muscle weakness (generalized) (M62.81);History of falling (Z91.81);Hemiplegia and hemiparesis;Pain Hemiplegia - Right/Left: Left Hemiplegia - dominant/non-dominant: Non-dominant Pain - Right/Left: Right Pain - part of body:  (pelvis)    Time: 2297-9892 PT Time Calculation (min) (ACUTE ONLY): 18 min   Charges:   PT Evaluation $PT Eval Moderate Complexity: 1 Mod            Faye Ramsay, PT Acute Rehabilitation  Office: [phone] Pager: [phone]

## 2020-10-18 NOTE — Progress Notes (Signed)
Patient ID: [name], female   DOB: 1940/11/26, 80 y.o.   MRN: [id number]  PROGRESS NOTE    [name]  WUG:[id number] DOB: 1941-07-15 DOA: 10/17/2020 PCP: [name], MD   Brief Narrative:  80 year old female with history of chronic A. fib, chronic back pain, GERD, recurrent falls, OSA on CPAP, hyperlipidemia, diabetes mellitus type 2, chronic anticoagulation, history of SDH after a fall on Coumadin, history of unspecified CVA 4 years ago, T12 compression fracture presented with a mechanical fall while she was trying to get out of the door.  She was found to have severe hip pain.  On presentation, she was found to have right pubic rami fracture.  Orthopedics was consulted by ED provider who recommended conservative measures with pain control and weightbearing as tolerated.  Assessment & Plan:   Closed fracture of right pubic rami following a mechanical fall -Patient presented with severe pain following a fall and CT pelvis showing no fracture of right pubic rami.  ED provider consulted orthopedics who recommended conservative measures with pain management and weightbearing as tolerated.  Continue scheduled Toradol along with oral narcotics and as needed IV narcotics.  Monitor mental status. -PT eval.  Continue muscle relaxants as needed.  Chronic A. fib -Currently rate controlled.  Continue Lopressor.  Will resume Eliquis as patient does not need any surgery  Dyslipidemia -Continue Pravachol  Diabetes mellitus type II, uncontrolled with neuropathy -Metformin on hold.  Continue CBGs with SSI  History of unspecified CVA -Resume Eliquis.  Continue statin  Hyperlipidemia -Continue statin  DVT prophylaxis: Resume Eliquis Code Status: Full Family Communication: Daughter at bedside Disposition Plan: Status is: Observation  The patient will require care spanning > 2 midnights and should be moved to inpatient because: Inpatient level of care appropriate due to severity of illness  Dispo:  The patient is from: Home              Anticipated d/c is to: Home              Anticipated d/c date is: 1 day              Patient currently is not medically stable to d/c.   Consultants: Orthopedics called by ED provider  Procedures: None  Antimicrobials: None   Subjective: Patient seen and examined at bedside.  Daughter present at bedside who translates for the patient.  Complains of intermittent hip pain and required IV medicine last night.  Denies any worsening shortness breath, chest pain, fever or vomiting.  Objective: Vitals:   10/17/20 2129 10/18/20 0046 10/18/20 0501 10/18/20 0729  BP:  111/72 101/65 99/60  Pulse:  93 94 79  Resp:  18 18 18   Temp:  97.7 F (36.5 C) (!) 97.2 F (36.2 C) 98.6 F (37 C)  TempSrc:  Oral Oral   SpO2:  90% (!) 89% 90%  Weight: 64.7 kg     Height: 4\' 10"  (1.473 m)       Intake/Output Summary (Last 24 hours) at 10/18/2020 0956 Last data filed at 10/18/2020 0600 Gross per 24 hour  Intake 100 ml  Output 200 ml  Net -100 ml   Filed Weights   10/17/20 2129  Weight: 64.7 kg    Examination:  General exam: Appears calm and comfortable.  Chronically ill.  Poor historian. Respiratory system: Bilateral decreased breath sounds at bases Cardiovascular system: S1 & S2 heard, Rate controlled Gastrointestinal system: Abdomen is nondistended, soft and nontender. Normal bowel sounds heard. Extremities: No cyanosis,  clubbing, edema  Central nervous system: Alert and oriented. No focal neurological deficits. Moving extremities Skin: No rashes, lesions or ulcers Psychiatry: Flat affect    Data Reviewed: I have personally reviewed following labs and imaging studies  CBC: Recent Labs  Lab 10/17/20 1824  WBC 12.1*  NEUTROABS 9.7*  HGB 13.3  HCT 40.5  MCV 100.7*  PLT 557   Basic Metabolic Panel: Recent Labs  Lab 10/17/20 1824  NA 142  K 4.0  CL 103  CO2 27  GLUCOSE 138*  BUN 17  CREATININE 0.75  CALCIUM 9.7   GFR: Estimated  Creatinine Clearance: 45.4 mL/min (by C-G formula based on SCr of 0.75 mg/dL). Liver Function Tests: Recent Labs  Lab 10/17/20 1824  AST 25  ALT 19  ALKPHOS 39  BILITOT 0.6  PROT 7.5  ALBUMIN 4.7   No results for input(s): LIPASE, AMYLASE in the last 168 hours. No results for input(s): AMMONIA in the last 168 hours. Coagulation Profile: No results for input(s): INR, PROTIME in the last 168 hours. Cardiac Enzymes: No results for input(s): CKTOTAL, CKMB, CKMBINDEX, TROPONINI in the last 168 hours. BNP (last 3 results) No results for input(s): PROBNP in the last 8760 hours. HbA1C: No results for input(s): HGBA1C in the last 72 hours. CBG: Recent Labs  Lab 10/17/20 2249 10/18/20 0731  GLUCAP 171* 146*   Lipid Profile: No results for input(s): CHOL, HDL, LDLCALC, TRIG, CHOLHDL, LDLDIRECT in the last 72 hours. Thyroid Function Tests: No results for input(s): TSH, T4TOTAL, FREET4, T3FREE, THYROIDAB in the last 72 hours. Anemia Panel: No results for input(s): VITAMINB12, FOLATE, FERRITIN, TIBC, IRON, RETICCTPCT in the last 72 hours. Sepsis Labs: No results for input(s): PROCALCITON, LATICACIDVEN in the last 168 hours.  Recent Results (from the past 240 hour(s))  Resp Panel by RT-PCR (Flu A&B, Covid) Nasopharyngeal Swab     Status: None   Collection Time: 10/17/20  6:38 PM   Specimen: Nasopharyngeal Swab; Nasopharyngeal(NP) swabs in vial transport medium  Result Value Ref Range Status   SARS Coronavirus 2 by RT PCR NEGATIVE NEGATIVE Final    Comment: (NOTE) SARS-CoV-2 target nucleic acids are NOT DETECTED.  The SARS-CoV-2 RNA is generally detectable in upper respiratory specimens during the acute phase of infection. The lowest concentration of SARS-CoV-2 viral copies this assay can detect is 138 copies/mL. A negative result does not preclude SARS-Cov-2 infection and should not be used as the sole basis for treatment or other patient management decisions. A negative result  may occur with  improper specimen collection/handling, submission of specimen other than nasopharyngeal swab, presence of viral mutation(s) within the areas targeted by this assay, and inadequate number of viral copies(<138 copies/mL). A negative result must be combined with clinical observations, patient history, and epidemiological information. The expected result is Negative.  Fact Sheet for Patients:  EntrepreneurPulse.com.au  Fact Sheet for Healthcare Providers:  IncredibleEmployment.be  This test is no t yet approved or cleared by the [place] FDA and  has been authorized for detection and/or diagnosis of SARS-CoV-2 by FDA under an Emergency Use Authorization (EUA). This EUA will remain  in effect (meaning this test can be used) for the duration of the COVID-19 declaration under Section 564(b)(1) of the Act, 21 U.S.C.section 360bbb-3(b)(1), unless the authorization is terminated  or revoked sooner.       Influenza A by PCR NEGATIVE NEGATIVE Final   Influenza B by PCR NEGATIVE NEGATIVE Final    Comment: (NOTE) The Xpert Xpress SARS-CoV-2/FLU/RSV plus  assay is intended as an aid in the diagnosis of influenza from Nasopharyngeal swab specimens and should not be used as a sole basis for treatment. Nasal washings and aspirates are unacceptable for Xpert Xpress SARS-CoV-2/FLU/RSV testing.  Fact Sheet for Patients: BloggerCourse.com  Fact Sheet for Healthcare Providers: SeriousBroker.it  This test is not yet approved or cleared by the [place] FDA and has been authorized for detection and/or diagnosis of SARS-CoV-2 by FDA under an Emergency Use Authorization (EUA). This EUA will remain in effect (meaning this test can be used) for the duration of the COVID-19 declaration under Section 564(b)(1) of the Act, 21 U.S.C. section 360bbb-3(b)(1), unless the authorization is terminated  or revoked.  Performed at [facility], [address] [place], [place] [address]          Radiology Studies: DG Pelvis 1-2 Views  Result Date: 10/17/2020 CLINICAL DATA:  Fall with right hip pain EXAM: PELVIS - 1-2 VIEW COMPARISON:  01/16/2020 FINDINGS: Both femoral heads project in joint. Pubic symphysis is intact. Suspected acute fractures of the right superior and inferior pubic rami. There may be associated lucent lesion within the right inferior pubic ramus. IMPRESSION: Suspected acute fractures of the right superior and inferior pubic rami with possible associated lucent lesion in the right inferior pubic ramus. Electronically Signed   By: Jasmine Pang M.D.   On: 10/17/2020 17:46   DG Forearm Right  Result Date: 10/17/2020 CLINICAL DATA:  Right forearm pain after fall EXAM: RIGHT FOREARM - 2 VIEW COMPARISON:  None. FINDINGS: There is no evidence of fracture or other focal bone lesions. Soft tissues are unremarkable. IMPRESSION: Negative. Electronically Signed   By: Duanne Guess D.O.   On: 10/17/2020 17:44   CT Head Wo Contrast  Result Date: 10/17/2020 CLINICAL DATA:  Fall.  Anticoagulated EXAM: CT HEAD WITHOUT CONTRAST TECHNIQUE: Contiguous axial images were obtained from the base of the skull through the vertex without intravenous contrast. COMPARISON:  None. FINDINGS: Brain: Diffuse cerebral atrophy. Ventricular dilatation consistent with central atrophy. Low-attenuation changes in the deep white matter consistent small vessel ischemic change. Old lacunar infarcts in the deep white matter. No mass effect or midline shift. No abnormal extra-axial fluid collections. Gray-white matter junctions are distinct. Basal cisterns are not effaced. No acute intracranial hemorrhage. Basal ganglia calcifications. Vascular: Moderate intracranial arterial vascular calcifications. Skull: Calvarium appears intact. Sinuses/Orbits: Paranasal sinuses and mastoid air cells are clear.  Other: None. IMPRESSION: 1. No acute intracranial abnormalities. 2. Chronic atrophy and small vessel ischemic changes. Old lacunar infarcts in the deep white matter. Electronically Signed   By: Burman Nieves M.D.   On: 10/17/2020 17:09   CT PELVIS WO CONTRAST  Result Date: 10/17/2020 CLINICAL DATA:  Fall. EXAM: CT PELVIS WITHOUT CONTRAST TECHNIQUE: Multidetector CT imaging of the pelvis was performed following the standard protocol without intravenous contrast. COMPARISON:  January 18, 2008. FINDINGS: Urinary Tract:  No abnormality visualized. Bowel:  Unremarkable visualized pelvic bowel loops. Vascular/Lymphatic: No pathologically enlarged lymph nodes. No significant vascular abnormality seen. Reproductive: Status post hysterectomy. No adnexal abnormality is noted. Other:  No hernia or abnormal fluid collection is noted. Musculoskeletal: Moderately displaced and comminuted fracture is seen involving the right inferior pubic ramus. There is a nondisplaced fracture involving the right superior pubic ramus at its junction with the anterior acetabulum. IMPRESSION: 1. Moderately displaced and comminuted fracture is seen involving the right inferior pubic ramus. 2. Nondisplaced fracture involving the right superior pubic ramus at its junction with  the anterior acetabulum. Electronically Signed   By: Lupita Raider M.D.   On: 10/17/2020 18:21   DG Knee Right Port  Result Date: 10/17/2020 CLINICAL DATA:  Fall with right knee pain EXAM: PORTABLE RIGHT KNEE - 1-2 VIEW COMPARISON:  None. FINDINGS: No acute displaced fracture or malalignment. Trace knee effusion. Mild tricompartment arthritis of the knee. IMPRESSION: No acute osseous abnormality. Electronically Signed   By: Jasmine Pang M.D.   On: 10/17/2020 19:55   DG Abd Portable 1V  Result Date: 10/17/2020 CLINICAL DATA:  Right knee pain EXAM: PORTABLE ABDOMEN - 1 VIEW COMPARISON:  None. FINDINGS: The bowel gas pattern is normal. No radio-opaque calculi or other  significant radiographic abnormality are seen. IMPRESSION: Negative. Electronically Signed   By: Jonna Clark M.D.   On: 10/17/2020 19:57   DG Hand Complete Right  Result Date: 10/17/2020 CLINICAL DATA:  Right hand pain after fall EXAM: RIGHT HAND - COMPLETE 3+ VIEW COMPARISON:  None. FINDINGS: No acute fracture. No dislocation. Mild degenerative changes throughout the hand and wrist. No focal erosion. Soft tissues within normal limits. IMPRESSION: No acute findings. Mild degenerative changes. Electronically Signed   By: Duanne Guess D.O.   On: 10/17/2020 17:43   DG FEMUR, MIN 2 VIEWS RIGHT  Result Date: 10/17/2020 CLINICAL DATA:  Fall with right hip pain EXAM: RIGHT FEMUR 2 VIEWS COMPARISON:  01/16/2020 FINDINGS: No fracture or malalignment of the right femur. The right femoral head projects in joint. Suspected acute fractures involving the right superior and inferior pubic ramus. Questionable subtle lucent lesion in the right inferior pubic ramus. IMPRESSION: 1. Suspected acute fractures involving the right superior and inferior pubic rami. 2. Possible lucent lesion in the right inferior pubic ramus, raising concern for pathologic fracture Electronically Signed   By: Jasmine Pang M.D.   On: 10/17/2020 17:45        Scheduled Meds: . docusate sodium  100 mg Oral BID  . enoxaparin (LOVENOX) injection  40 mg Subcutaneous Q24H  . insulin aspart  0-5 Units Subcutaneous QHS  . insulin aspart  0-9 Units Subcutaneous TID WC  . ketorolac  15 mg Intravenous Q6H  . metoprolol tartrate  12.5 mg Oral BID  . mirabegron ER  50 mg Oral QHS  . pravastatin  20 mg Oral QHS   Continuous Infusions:        Glade Lloyd, MD [facility] 10/18/2020, 9:56 AM

## 2020-10-19 ENCOUNTER — Inpatient Hospital Stay (HOSPITAL_COMMUNITY): Payer: Medicare Other

## 2020-10-19 DIAGNOSIS — S32591A Other specified fracture of right pubis, initial encounter for closed fracture: Secondary | ICD-10-CM | POA: Diagnosis not present

## 2020-10-19 DIAGNOSIS — I482 Chronic atrial fibrillation, unspecified: Secondary | ICD-10-CM | POA: Diagnosis not present

## 2020-10-19 DIAGNOSIS — R079 Chest pain, unspecified: Secondary | ICD-10-CM

## 2020-10-19 LAB — MRSA PCR SCREENING: MRSA by PCR: NEGATIVE

## 2020-10-19 LAB — CBC WITH DIFFERENTIAL/PLATELET
Abs Immature Granulocytes: 0.06 10*3/uL (ref 0.00–0.07)
Basophils Absolute: 0.1 10*3/uL (ref 0.0–0.1)
Basophils Relative: 0 %
Eosinophils Absolute: 0.1 10*3/uL (ref 0.0–0.5)
Eosinophils Relative: 1 %
HCT: 33.6 % — ABNORMAL LOW (ref 36.0–46.0)
Hemoglobin: 10.7 g/dL — ABNORMAL LOW (ref 12.0–15.0)
Immature Granulocytes: 1 %
Lymphocytes Relative: 9 %
Lymphs Abs: 1.1 10*3/uL (ref 0.7–4.0)
MCH: 33 pg (ref 26.0–34.0)
MCHC: 31.8 g/dL (ref 30.0–36.0)
MCV: 103.7 fL — ABNORMAL HIGH (ref 80.0–100.0)
Monocytes Absolute: 1 10*3/uL (ref 0.1–1.0)
Monocytes Relative: 8 %
Neutro Abs: 10.4 10*3/uL — ABNORMAL HIGH (ref 1.7–7.7)
Neutrophils Relative %: 81 %
Platelets: 217 10*3/uL (ref 150–400)
RBC: 3.24 MIL/uL — ABNORMAL LOW (ref 3.87–5.11)
RDW: 12.5 % (ref 11.5–15.5)
WBC: 12.7 10*3/uL — ABNORMAL HIGH (ref 4.0–10.5)
nRBC: 0 % (ref 0.0–0.2)

## 2020-10-19 LAB — MAGNESIUM: Magnesium: 2.1 mg/dL (ref 1.7–2.4)

## 2020-10-19 LAB — BASIC METABOLIC PANEL
Anion gap: 11 (ref 5–15)
BUN: 39 mg/dL — ABNORMAL HIGH (ref 8–23)
CO2: 22 mmol/L (ref 22–32)
Calcium: 8.4 mg/dL — ABNORMAL LOW (ref 8.9–10.3)
Chloride: 103 mmol/L (ref 98–111)
Creatinine, Ser: 1.09 mg/dL — ABNORMAL HIGH (ref 0.44–1.00)
GFR, Estimated: 52 mL/min — ABNORMAL LOW (ref >60)
Glucose, Bld: 183 mg/dL — ABNORMAL HIGH (ref 70–99)
Potassium: 3.9 mmol/L (ref 3.5–5.1)
Sodium: 136 mmol/L (ref 135–145)

## 2020-10-19 LAB — TROPONIN I (HIGH SENSITIVITY)
Troponin I (High Sensitivity): 4 ng/L (ref <18)
Troponin I (High Sensitivity): 4 ng/L (ref <18)

## 2020-10-19 LAB — GLUCOSE, CAPILLARY
Glucose-Capillary: 152 mg/dL — ABNORMAL HIGH (ref 70–99)
Glucose-Capillary: 172 mg/dL — ABNORMAL HIGH (ref 70–99)
Glucose-Capillary: 150 mg/dL — ABNORMAL HIGH (ref 70–99)
Glucose-Capillary: 194 mg/dL — ABNORMAL HIGH (ref 70–99)

## 2020-10-19 MED ORDER — PANTOPRAZOLE SODIUM 40 MG IV SOLR
40.0000 mg | Freq: Two times a day (BID) | INTRAVENOUS | Status: DC
  Administered 2020-10-19: 40 mg via INTRAVENOUS
  Filled 2020-10-19 (×2): qty 40

## 2020-10-19 MED ORDER — CHLORHEXIDINE GLUCONATE CLOTH 2 % EX PADS
6.0000 | MEDICATED_PAD | Freq: Every day | CUTANEOUS | Status: DC
  Administered 2020-10-19: 6 via TOPICAL

## 2020-10-19 MED ORDER — SODIUM CHLORIDE 0.9 % IV BOLUS
500.0000 mL | Freq: Once | INTRAVENOUS | Status: AC
  Administered 2020-10-19: 500 mL via INTRAVENOUS

## 2020-10-19 MED ORDER — NITROGLYCERIN 0.4 MG SL SUBL: 0.4000 mg | SUBLINGUAL_TABLET | SUBLINGUAL | Status: DC | PRN

## 2020-10-19 MED ORDER — ALUM & MAG HYDROXIDE-SIMETH 200-200-20 MG/5ML PO SUSP: 15.0000 mL | ORAL | Status: DC | PRN

## 2020-10-19 MED ORDER — MUPIROCIN 2 % EX OINT: 1.0000 "application " | TOPICAL_OINTMENT | Freq: Two times a day (BID) | CUTANEOUS | Status: DC

## 2020-10-19 MED ORDER — ORAL CARE MOUTH RINSE
15.0000 mL | Freq: Two times a day (BID) | OROMUCOSAL | Status: DC
  Administered 2020-10-19 – 2020-10-20 (×3): 15 mL via OROMUCOSAL

## 2020-10-19 MED ORDER — MORPHINE SULFATE (PF) 2 MG/ML IV SOLN
2.0000 mg | INTRAVENOUS | Status: DC | PRN
  Administered 2020-10-19: 2 mg via INTRAVENOUS
  Filled 2020-10-19: qty 1

## 2020-10-19 NOTE — Progress Notes (Signed)
Nutrition Brief Note  Consult received per hip/femur fracture protocol.   Wt Readings from Last 15 Encounters:  10/17/20 64.7 kg  02/14/18 63.3 kg  12/20/16 61.1 kg  10/26/16 60.1 kg  10/15/16 61.7 kg  11/11/14 54.6 kg  08/26/14 53.2 kg  05/12/14 52.4 kg    Body mass index is 29.81 kg/m. Patient meets criteria for overweight/borderline obesity based on current BMI. Weight on 1/1 was 143 lb and PTA the most recently documented weight was at [facility] on 08/26/20 when she weighed 142 lb.   Skin WDL. Patient admitted after a mechanical fall which resulted in R pubic rami fracture. Ortho is following and recommends conservative management.   Patient is from home and plan is for CIR at the time of d/c. Patient was admitted to 3W on 1/1 but after Rapid Response earlier this AM d/t afib, she was transferred to 2W.  Current diet order is 2 gram Na. The only documented meal intakes are 50% of breakfast and 80% of lunch yesterday (total of 964 kcal, 33 grams protein).   Labs and medications reviewed.  No nutrition interventions warranted at this time. If nutrition issues arise, please re-consult RD.       Trenton Gammon, MS, RD, LDN, CNSC Inpatient Clinical Dietitian RD pager # available in [facility]  After hours/weekend pager # available in [facility]

## 2020-10-19 NOTE — Progress Notes (Signed)
Patient ID: [name], female   DOB: 29-Dec-1940, 80 y.o.   MRN: [id number]  PROGRESS NOTE    [name]  PPJ:[id number] DOB: 07/23/1941 DOA: 10/17/2020 PCP: [name], MD   Brief Narrative:  80 year old female with history of chronic A. fib, chronic back pain, GERD, recurrent falls, OSA on CPAP, hyperlipidemia, diabetes mellitus type 2, chronic anticoagulation, history of SDH after a fall on Coumadin, history of unspecified CVA 4 years ago, T12 compression fracture presented with a mechanical fall while she was trying to get out of the door.  She was found to have severe hip pain.  On presentation, she was found to have right pubic rami fracture.  Orthopedics was consulted by ED provider who recommended conservative measures with pain control and weightbearing as tolerated.  Assessment & Plan:   Closed fracture of right pubic rami following a mechanical fall -Patient presented with severe pain following a fall and CT pelvis showing no fracture of right pubic rami.  ED provider consulted orthopedics who recommended conservative measures with pain management and weightbearing as tolerated.  Continue current pain management.  Monitor mental status. -PT recommended CIR.  Will consult CR  Chest pain -Patient having chest pain this morning.  Blood pressure on the lower side.  Will give normal saline bolus 500 cc.  EKG, troponins, morphine as needed.  As needed nitroglycerin if blood pressure stable. -Transfer patient to telemetry  Chronic A. fib -Currently rate controlled.  Hold Lopressor because of low blood pressure.  Will resume Eliquis as patient does not need any surgery  Dyslipidemia -Continue Pravachol  Diabetes mellitus type II, uncontrolled with neuropathy -Metformin on hold.  Continue CBGs with SSI  History of unspecified CVA -Continue Eliquis.  Continue statin  Hyperlipidemia -Continue statin  DVT prophylaxis: Resume Eliquis Code Status: Full Family Communication: Daughter at  bedside Disposition Plan: Status is: Inpatient.  Patient is currently not stable for discharge and patient is having chest pain.  She would need CIR discharge.  Dispo: The patient is from: Home              Anticipated d/c is to: CIR              Anticipated d/c date is: 1 day              Patient currently is not medically stable to d/c.   Consultants: Orthopedics   Procedures: None  Antimicrobials: None   Subjective: Patient seen and examined at bedside.  Daughter present at bedside who translates for the patient.  Started complaining of chest pain this morning.  Feels like short of breath.  No overnight fever, vomiting reported.  Still complains of hip pain. Objective: Vitals:   10/19/20 0545 10/19/20 0721 10/19/20 0724 10/19/20 0740  BP: 125/70 (!) 77/53 (!) 83/53 (!) 84/52  Pulse: 82 78 75 78  Resp: 16 18 18 18   Temp: 98.4 F (36.9 C) 97.8 F (36.6 C)    TempSrc: Oral Oral    SpO2: 92% 98% 98% 98%  Weight:      Height:        Intake/Output Summary (Last 24 hours) at 10/19/2020 0757 Last data filed at 10/19/2020 0600 Gross per 24 hour  Intake 1180 ml  Output 470 ml  Net 710 ml   Filed Weights   10/17/20 2129  Weight: 64.7 kg    Examination:  General exam: Mild distress secondary to chest pain.  Chronically ill.  Poor historian.  Currently on 2 L  oxygen via nasal cannula Respiratory system: Decreased breath sounds at bases bilaterally with some scattered crackles Cardiovascular system: Rate controlled, S1-S2 heard Gastrointestinal system: Abdomen is nondistended, soft and nontender.  Bowel sounds are heard  extremities: Mild lower extremity edema present.  No clubbing Central nervous system: Awake and alert.  Poor historian.  No focal neurological deficits.  Moves extremities Skin: No obvious lesions/ecchymosis  psychiatry: Affect is flat    Data Reviewed: I have personally reviewed following labs and imaging studies  CBC: Recent Labs  Lab 10/17/20 1824  10/19/20 0314  WBC 12.1* 12.7*  NEUTROABS 9.7* 10.4*  HGB 13.3 10.7*  HCT 40.5 33.6*  MCV 100.7* 103.7*  PLT 301 217   Basic Metabolic Panel: Recent Labs  Lab 10/17/20 1824 10/19/20 0314  NA 142 136  K 4.0 3.9  CL 103 103  CO2 27 22  GLUCOSE 138* 183*  BUN 17 39*  CREATININE 0.75 1.09*  CALCIUM 9.7 8.4*  MG  --  2.1   GFR: Estimated Creatinine Clearance: 33.3 mL/min (A) (by C-G formula based on SCr of 1.09 mg/dL (H)). Liver Function Tests: Recent Labs  Lab 10/17/20 1824  AST 25  ALT 19  ALKPHOS 39  BILITOT 0.6  PROT 7.5  ALBUMIN 4.7   No results for input(s): LIPASE, AMYLASE in the last 168 hours. No results for input(s): AMMONIA in the last 168 hours. Coagulation Profile: No results for input(s): INR, PROTIME in the last 168 hours. Cardiac Enzymes: No results for input(s): CKTOTAL, CKMB, CKMBINDEX, TROPONINI in the last 168 hours. BNP (last 3 results) No results for input(s): PROBNP in the last 8760 hours. HbA1C: Recent Labs    10/18/20 0300 10/18/20 0638  HGBA1C 6.5* 6.4*   CBG: Recent Labs  Lab 10/17/20 2249 10/18/20 0731 10/18/20 1121 10/18/20 1718 10/18/20 2128  GLUCAP 171* 146* 194* 149* 184*   Lipid Profile: No results for input(s): CHOL, HDL, LDLCALC, TRIG, CHOLHDL, LDLDIRECT in the last 72 hours. Thyroid Function Tests: No results for input(s): TSH, T4TOTAL, FREET4, T3FREE, THYROIDAB in the last 72 hours. Anemia Panel: No results for input(s): VITAMINB12, FOLATE, FERRITIN, TIBC, IRON, RETICCTPCT in the last 72 hours. Sepsis Labs: No results for input(s): PROCALCITON, LATICACIDVEN in the last 168 hours.  Recent Results (from the past 240 hour(s))  Resp Panel by RT-PCR (Flu A&B, Covid) Nasopharyngeal Swab     Status: None   Collection Time: 10/17/20  6:38 PM   Specimen: Nasopharyngeal Swab; Nasopharyngeal(NP) swabs in vial transport medium  Result Value Ref Range Status   SARS Coronavirus 2 by RT PCR NEGATIVE NEGATIVE Final     Comment: (NOTE) SARS-CoV-2 target nucleic acids are NOT DETECTED.  The SARS-CoV-2 RNA is generally detectable in upper respiratory specimens during the acute phase of infection. The lowest concentration of SARS-CoV-2 viral copies this assay can detect is 138 copies/mL. A negative result does not preclude SARS-Cov-2 infection and should not be used as the sole basis for treatment or other patient management decisions. A negative result may occur with  improper specimen collection/handling, submission of specimen other than nasopharyngeal swab, presence of viral mutation(s) within the areas targeted by this assay, and inadequate number of viral copies(<138 copies/mL). A negative result must be combined with clinical observations, patient history, and epidemiological information. The expected result is Negative.  Fact Sheet for Patients:  BloggerCourse.com  Fact Sheet for Healthcare Providers:  SeriousBroker.it  This test is no t yet approved or cleared by the [place] and  has been authorized for detection and/or diagnosis of SARS-CoV-2 by FDA under an Emergency Use Authorization (EUA). This EUA will remain  in effect (meaning this test can be used) for the duration of the COVID-19 declaration under Section 564(b)(1) of the Act, 21 U.S.C.section 360bbb-3(b)(1), unless the authorization is terminated  or revoked sooner.       Influenza A by PCR NEGATIVE NEGATIVE Final   Influenza B by PCR NEGATIVE NEGATIVE Final    Comment: (NOTE) The Xpert Xpress SARS-CoV-2/FLU/RSV plus assay is intended as an aid in the diagnosis of influenza from Nasopharyngeal swab specimens and should not be used as a sole basis for treatment. Nasal washings and aspirates are unacceptable for Xpert Xpress SARS-CoV-2/FLU/RSV testing.  Fact Sheet for Patients: BloggerCourse.com  Fact Sheet for Healthcare  Providers: SeriousBroker.it  This test is not yet approved or cleared by the [place] FDA and has been authorized for detection and/or diagnosis of SARS-CoV-2 by FDA under an Emergency Use Authorization (EUA). This EUA will remain in effect (meaning this test can be used) for the duration of the COVID-19 declaration under Section 564(b)(1) of the Act, 21 U.S.C. section 360bbb-3(b)(1), unless the authorization is terminated or revoked.  Performed at [facility], [address], [place], [place] [address]          Radiology Studies: DG Pelvis 1-2 Views  Result Date: 10/17/2020 CLINICAL DATA:  Fall with right hip pain EXAM: PELVIS - 1-2 VIEW COMPARISON:  01/16/2020 FINDINGS: Both femoral heads project in joint. Pubic symphysis is intact. Suspected acute fractures of the right superior and inferior pubic rami. There may be associated lucent lesion within the right inferior pubic ramus. IMPRESSION: Suspected acute fractures of the right superior and inferior pubic rami with possible associated lucent lesion in the right inferior pubic ramus. Electronically Signed   By: Jasmine Pang M.D.   On: 10/17/2020 17:46   DG Forearm Right  Result Date: 10/17/2020 CLINICAL DATA:  Right forearm pain after fall EXAM: RIGHT FOREARM - 2 VIEW COMPARISON:  None. FINDINGS: There is no evidence of fracture or other focal bone lesions. Soft tissues are unremarkable. IMPRESSION: Negative. Electronically Signed   By: Duanne Guess D.O.   On: 10/17/2020 17:44   CT Head Wo Contrast  Result Date: 10/17/2020 CLINICAL DATA:  Fall.  Anticoagulated EXAM: CT HEAD WITHOUT CONTRAST TECHNIQUE: Contiguous axial images were obtained from the base of the skull through the vertex without intravenous contrast. COMPARISON:  None. FINDINGS: Brain: Diffuse cerebral atrophy. Ventricular dilatation consistent with central atrophy. Low-attenuation changes in the deep white matter  consistent small vessel ischemic change. Old lacunar infarcts in the deep white matter. No mass effect or midline shift. No abnormal extra-axial fluid collections. Gray-white matter junctions are distinct. Basal cisterns are not effaced. No acute intracranial hemorrhage. Basal ganglia calcifications. Vascular: Moderate intracranial arterial vascular calcifications. Skull: Calvarium appears intact. Sinuses/Orbits: Paranasal sinuses and mastoid air cells are clear. Other: None. IMPRESSION: 1. No acute intracranial abnormalities. 2. Chronic atrophy and small vessel ischemic changes. Old lacunar infarcts in the deep white matter. Electronically Signed   By: Burman Nieves M.D.   On: 10/17/2020 17:09   CT PELVIS WO CONTRAST  Result Date: 10/17/2020 CLINICAL DATA:  Fall. EXAM: CT PELVIS WITHOUT CONTRAST TECHNIQUE: Multidetector CT imaging of the pelvis was performed following the standard protocol without intravenous contrast. COMPARISON:  January 18, 2008. FINDINGS: Urinary Tract:  No abnormality visualized. Bowel:  Unremarkable visualized pelvic bowel loops. Vascular/Lymphatic: No pathologically  enlarged lymph nodes. No significant vascular abnormality seen. Reproductive: Status post hysterectomy. No adnexal abnormality is noted. Other:  No hernia or abnormal fluid collection is noted. Musculoskeletal: Moderately displaced and comminuted fracture is seen involving the right inferior pubic ramus. There is a nondisplaced fracture involving the right superior pubic ramus at its junction with the anterior acetabulum. IMPRESSION: 1. Moderately displaced and comminuted fracture is seen involving the right inferior pubic ramus. 2. Nondisplaced fracture involving the right superior pubic ramus at its junction with the anterior acetabulum. Electronically Signed   By: Marijo Conception M.D.   On: 10/17/2020 18:21   DG Knee Right Port  Result Date: 10/17/2020 CLINICAL DATA:  Fall with right knee pain EXAM: PORTABLE RIGHT KNEE -  1-2 VIEW COMPARISON:  None. FINDINGS: No acute displaced fracture or malalignment. Trace knee effusion. Mild tricompartment arthritis of the knee. IMPRESSION: No acute osseous abnormality. Electronically Signed   By: Donavan Foil M.D.   On: 10/17/2020 19:55   DG Abd Portable 1V  Result Date: 10/17/2020 CLINICAL DATA:  Right knee pain EXAM: PORTABLE ABDOMEN - 1 VIEW COMPARISON:  None. FINDINGS: The bowel gas pattern is normal. No radio-opaque calculi or other significant radiographic abnormality are seen. IMPRESSION: Negative. Electronically Signed   By: Prudencio Pair M.D.   On: 10/17/2020 19:57   DG Hand Complete Right  Result Date: 10/17/2020 CLINICAL DATA:  Right hand pain after fall EXAM: RIGHT HAND - COMPLETE 3+ VIEW COMPARISON:  None. FINDINGS: No acute fracture. No dislocation. Mild degenerative changes throughout the hand and wrist. No focal erosion. Soft tissues within normal limits. IMPRESSION: No acute findings. Mild degenerative changes. Electronically Signed   By: Davina Poke D.O.   On: 10/17/2020 17:43   DG FEMUR, MIN 2 VIEWS RIGHT  Result Date: 10/17/2020 CLINICAL DATA:  Fall with right hip pain EXAM: RIGHT FEMUR 2 VIEWS COMPARISON:  01/16/2020 FINDINGS: No fracture or malalignment of the right femur. The right femoral head projects in joint. Suspected acute fractures involving the right superior and inferior pubic ramus. Questionable subtle lucent lesion in the right inferior pubic ramus. IMPRESSION: 1. Suspected acute fractures involving the right superior and inferior pubic rami. 2. Possible lucent lesion in the right inferior pubic ramus, raising concern for pathologic fracture Electronically Signed   By: Donavan Foil M.D.   On: 10/17/2020 17:45        Scheduled Meds: . apixaban  5 mg Oral BID  . docusate sodium  100 mg Oral BID  . insulin aspart  0-5 Units Subcutaneous QHS  . insulin aspart  0-9 Units Subcutaneous TID WC  . ketorolac  15 mg Intravenous Q6H  . metoprolol  tartrate  12.5 mg Oral BID  . mirabegron ER  50 mg Oral QHS  . pravastatin  20 mg Oral QHS   Continuous Infusions: . sodium chloride            Aline August, MD [facility] 10/19/2020, 7:57 AM

## 2020-10-19 NOTE — Progress Notes (Signed)
Inpatient Rehabilitation-Admissions Coordinator   Met with pt and her daughter bedside to discuss CIR recommended program. Per daughter, the pt has been slightly confused today. We discussed program details, expectations, anticipated LOS, and expected functional outcomes. Pt and her daughter appear to be open to the program, as the patient has been with [place] before after her stroke a few years ago. Discussed we would recommend close to 24/7 supervision at DC. Pt's daughter would like to speak to the rest of her family tonight before making a decision on CIR.   Plan to follow up tomorrow.   Raechel Ache, OTR/L  Rehab Admissions Coordinator  [phone] 10/19/2020 5:42 PM

## 2020-10-19 NOTE — Progress Notes (Signed)
Handoff report given to Rosaria Ferries, RN.

## 2020-10-19 NOTE — PMR Pre-admission (Signed)
PMR Admission Coordinator Pre-Admission Assessment  Patient: [name] is an 80 y.o., female MRN: [id number] DOB: 1941/04/25 Height: 4' 10" (147.3 cm) Weight: 68.1 kg  Insurance Information HMO:     PPO:      PCP:      IPA:      80/20: yes     OTHER:  PRIMARY: Medicare A and B      Policy#: [id number]      Subscriber: patient CM Name:       Phone#:      Fax#:  Pre-Cert#:       Employer:  Benefits:  Phone #: verified online     Name: verified eligibility via [place] on 10/20/20 Eff. Date: part A effective 12/16/2006; Part B effective 08/17/2006     Deduct: $1,556      Out of Pocket Max: NA      Life Max: NA CIR: Covered per Medicare guidelines once yearly deductible has been met      SNF: days 1-20, 100%; days 21-100, 80% Outpatient: 80%     Co-Pay: 20% Home Health: 100%      Co-Pay:  DME: 80%     Co-Pay: 20% Providers: Pt's choice *Pt has qualified medicare beneficiary  SECONDARY: Medicaid [place]      KGYJEH#:[id number] T      Phone#: [phone]  Financial Counselor:       Phone#:   The Data Collection Information Summary for patients in Inpatient Rehabilitation Facilities with attached Privacy Act [place] Records was provided and verbally reviewed with: Family  Emergency Contact Information Contact Information    Name Relation Home Work Mobile   Lieu,Tamarra Daughter   [phone]   Nguyen,(SIL)[name] Relative   [phone]   Sylvie Farrier   [phone]   Eliseo Gum Other   [phone]      Current Medical History  Patient Admitting Diagnosis: closed fracture of right pubic rami following a mechanical fall  History of Present Illness: [name] is a 80 year old right-handed [place] speaking female with history of chronic atrial fibrillation currently maintained on Eliquis.  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  Per chart review patient lives with her daughter and son-in-law.  She also has other family in the area.  Two-level home bed and bath main level 2 steps to entry.  Independent with assistive device using a rolling walker as well is reportedly independent with ADLs.  Presented 10/17/2020 after mechanical fall while using her walker landing on her right hip.  No loss of consciousness.  Cranial CT scan negative for acute changes.  X-rays and imaging revealed moderately displaced and comminuted fracture involving the right inferior pubic ramus.  Nondisplaced fracture involving the right superior pubic ramus at its junction with the anterior acetabulum.  Orthopedic service follow-up Dr. Mardelle Matte conservative care no surgical intervention weightbearing as tolerated.  Admission chemistries WBC 12,100, glucose 138, hemoglobin A1c 6.5, troponin negative.  She currently remains on Eliquis as prior to admission.  Pain control with the use of the Lidoderm patch as needed as well as oxycodone/Zanaflex.  Due to patient decrease in functional mobility as well as history of CVA left side weakness, pt was recommended for CIR. Pt is to admit to CIR on 10/20/2020.     Patient's medical record  [facility] has been reviewed by the rehabilitation admission coordinator and physician.  Past Medical History  Past Medical History:  Diagnosis Date   Atrial fibrillation ([place])    Chronic back pain    "mid back down into lower back" (08/25/2014)   Frequent falls    GERD (gastroesophageal reflux disease)    Hypertriglyceridemia    OSA on CPAP    Osteoarthritis    "knees, hands, back" (08/25/2014)   Pneumonia ~ 2000 X 1   Subdural hematoma ([place]) july 2015   S/P fall while on Coumadin   T12 compression fracture ([place]) 2012   Type II diabetes mellitus ([place])     Family  History   family history is not on file.  Prior Rehab/Hospitalizations Has the patient had prior rehab or hospitalizations prior to admission? Yes -reports been to CIR prior about 4 years ago  Has the patient had major surgery during 100 days prior to admission? No   Current Medications  Current Facility-Administered Medications:    0.9 %  sodium chloride infusion, , Intravenous, PRN, Starla Link, Kshitiz, MD, Stopped at 10/19/20 1135   acetaminophen (TYLENOL) tablet 650 mg, 650 mg, Oral, Q6H PRN, Lavina Hamman, MD, 650 mg at 10/19/20 0433   alum & mag hydroxide-simeth (MAALOX/MYLANTA) 200-200-20 MG/5ML suspension 15 mL, 15 mL, Oral, Q4H PRN, Starla Link, Kshitiz, MD   apixaban (ELIQUIS) tablet 5 mg, 5 mg, Oral, BID, Arlyn Dunning M, RPH, 5 mg at 10/20/20 1054   bisacodyl (DULCOLAX) suppository 10 mg, 10 mg, Rectal, Daily PRN, Starla Link, Kshitiz, MD   butalbital-acetaminophen-caffeine (FIORICET) 50-325-40 MG per tablet 1 tablet, 1 tablet, Oral, Q12H PRN, Lavina Hamman, MD   Chlorhexidine Gluconate Cloth 2 % PADS 6 each, 6 each, Topical, Daily, Alekh, Kshitiz, MD, 6 each at 10/19/20 1002   fluticasone (FLONASE) 50 MCG/ACT nasal spray 1 spray, 1 spray, Each Nare, Daily PRN, Lavina Hamman, MD   gabapentin (NEURONTIN) 250 MG/5ML solution 100 mg, 100 mg, Oral, QHS PRN, Lavina Hamman, MD   HYDROmorphone (DILAUDID) injection 0.5 mg, 0.5 mg, Intravenous, Q2H PRN, Lavina Hamman, MD, 0.5 mg at 10/17/20 2108   insulin aspart (novoLOG) injection 0-5 Units, 0-5 Units, Subcutaneous, QHS, Patel, Josetta Huddle, MD   insulin aspart (novoLOG) injection 0-9 Units, 0-9 Units, Subcutaneous, TID WC, Lavina Hamman, MD, 1 Units at 10/20/20 0900   levalbuterol (XOPENEX HFA) inhaler 1 puff, 1 puff, Inhalation, Q4H PRN, Lavina Hamman, MD   lidocaine (LIDODERM) 5 % 2 patch, 2 patch, Transdermal, Daily PRN, Lavina Hamman, MD, 2 patch at 10/18/20 0024   MEDLINE mouth rinse, 15 mL, Mouth Rinse, BID, Alekh,  Kshitiz, MD, 15 mL at 10/20/20 1055   metoprolol tartrate (LOPRESSOR) tablet 12.5 mg, 12.5 mg, Oral, BID, Alekh, Kshitiz, MD, 12.5 mg at 10/20/20 1054   mirabegron ER (MYRBETRIQ) tablet 50 mg, 50 mg, Oral, QHS, Lavina Hamman, MD, 50 mg at 10/19/20 2243   morphine 2 MG/ML injection 2 mg, 2 mg, Intravenous, Q2H PRN, Starla Link, Kshitiz, MD, 2 mg at 10/19/20 0743   mupirocin ointment (BACTROBAN) 2 % 1 application, 1 application, Nasal, BID, Alekh, Kshitiz, MD   nitroGLYCERIN (NITROSTAT) SL tablet 0.4 mg, 0.4 mg, Sublingual, Q5 min PRN, Starla Link, Kshitiz, MD   ondansetron (ZOFRAN) injection 4 mg, 4 mg, Intravenous, Q6H PRN, Starla Link, Kshitiz, MD, 4 mg at 10/18/20 1022   oxyCODONE-acetaminophen (PERCOCET/ROXICET) 5-325 MG per tablet 1 tablet, 1 tablet, Oral, Q4H PRN, Lavina Hamman, MD, 1  tablet at 10/20/20 1053   pantoprazole (PROTONIX) EC tablet 40 mg, 40 mg, Oral, Daily, Alekh, Kshitiz, MD, 40 mg at 10/20/20 1100   polyethylene glycol (MIRALAX / GLYCOLAX) packet 17 g, 17 g, Oral, Daily PRN, Lavina Hamman, MD   pravastatin (PRAVACHOL) tablet 20 mg, 20 mg, Oral, QHS, Lavina Hamman, MD, 20 mg at 10/19/20 2244   senna-docusate (Senokot-S) tablet 1 tablet, 1 tablet, Oral, BID, Alekh, Kshitiz, MD, 1 tablet at 10/20/20 1100   tiZANidine (ZANAFLEX) tablet 2 mg, 2 mg, Oral, Q8H PRN, Lavina Hamman, MD, 2 mg at 10/19/20 0551   zolpidem (AMBIEN) tablet 5 mg, 5 mg, Oral, QHS PRN, Lavina Hamman, MD  Patients Current Diet:  Diet Order            Diet regular Room service appropriate? Yes; Fluid consistency: Thin  Diet effective now           Diet general                 Precautions / Restrictions Precautions Precautions: Fall Restrictions Weight Bearing Restrictions: Yes RLE Weight Bearing: Weight bearing as tolerated Other Position/Activity Restrictions: WBAT   Has the patient had 2 or more falls or a fall with injury in the past year? Yes  Prior Activity Level Household: been less  active since [place]; does not drive or work.  Prior Functional Level Self Care: Did the patient need help bathing, dressing, using the toilet or eating? Independent  Indoor Mobility: Did the patient need assistance with walking from room to room (with or without device)? Independent  Stairs: Did the patient need assistance with internal or external stairs (with or without device)? Needed some help  Functional Cognition: Did the patient need help planning regular tasks such as shopping or remembering to take medications? Needed some help  Home Assistive Devices / [place] Devices/Equipment: Gilford Rile (specify type) (Front wheel walker) Home Equipment: Walker - 2 wheels,Walker - 4 wheels,Bedside commode,Tub bench  Prior Device Use: Indicate devices/aids used by the patient prior to current illness, exacerbation or injury? Rollator for outside; RW for inside; and 4pt cane at times  Current Functional Level Cognition  Overall Cognitive Status: Difficult to assess Difficult to assess due to: Non-English speaking Orientation Level: Oriented to person,Oriented to place,Disoriented to time,Disoriented to situation General Comments: appears [facility]    Extremity Assessment (includes Sensation/Coordination)  Upper Extremity Assessment: Defer to OT evaluation  Lower Extremity Assessment: LLE deficits/detail LLE Deficits / Details: hip flex at least 2/5, knee ext at least 3/5, DF at least 2/5 LLE Sensation: decreased proprioception LLE Coordination: decreased fine motor,decreased gross motor    ADLs  Overall ADL's : Needs assistance/impaired Eating/Feeding: Set up,Sitting Grooming: Set up,Sitting Upper Body Bathing: Set up,Sitting Lower Body Bathing: Maximal assistance,Sit to/from stand Upper Body Dressing : Set up,Sitting Lower Body Dressing: Total assistance,Sit to/from stand Toilet Transfer: Moderate assistance,BSC,Stand-pivot,RW Toilet Transfer Details (indicate cue type and  reason): as demonstrated from recliner transfer [place] and Hygiene: Total assistance,Sit to/from stand Functional mobility during ADLs: Moderate assistance,Rolling walker    Mobility  Overal bed mobility: Needs Assistance Bed Mobility: Rolling,Sidelying to Sit Rolling: Mod assist Sidelying to sit: Max assist,HOB elevated General bed mobility comments: oob in recliner    Transfers  Overall transfer level: Needs assistance Equipment used: Rolling walker (2 wheeled) Transfers: Sit to/from Stand Sit to Stand: Min assist Stand pivot transfers: Mod assist General transfer comment: Assist to rise, stabilize, control descent. Multimodal cueing  for safety, hand placement. Increased time.    Ambulation / Gait / Stairs / Wheelchair Mobility  Ambulation/Gait Ambulation/Gait assistance: Min assist,+2 safety/equipment Gait Distance (Feet): 7 Feet Assistive device: Rolling walker (2 wheeled) Gait Pattern/deviations: Step-to pattern,Trunk flexed,Antalgic General Gait Details: Assist to stabilize pt, advance L LE intermittently, manage RW, follow with recliner. Cues for safety, technique, sequence, proper use of RW. Distance limited by pain, weakness.    Posture / Balance Balance Overall balance assessment: Needs assistance,History of Falls Sitting-balance support: No upper extremity supported,Feet supported Sitting balance-Leahy Scale: Fair Standing balance support: Bilateral upper extremity supported Standing balance-Leahy Scale: Poor    Special needs/care consideration Continuous Drip IV: 0.9% sodium chloride infusion, Oxygen: on RA, Skin : abrasion to right arm; ecchymosis to right leg, Diabetic management: yes and Special service needs: [place] speaking (daughter translates when present).    Previous Home Environment (from acute therapy documentation) Living Arrangements: Children Available Help at Discharge: Available PRN/intermittently,Family Type of Home:  [place]: Two level,Able to live on main level with bedroom/bathroom Home Access: Stairs to enter Entrance Stairs-Number of Steps: 2 Bathroom Shower/Tub: Chiropodist: [place]: No  Discharge Living Setting Plans for Discharge Living Setting: House (lives with daughter) Type of Home at Discharge: House Discharge Home Layout: Able to live on main level with bedroom/bathroom Discharge Home Access: Stairs to enter Entrance Stairs-Rails: Left Entrance Stairs-Number of Steps: 2 Discharge Bathroom Shower/Tub: Tub/shower unit Discharge Bathroom Toilet: Handicapped height Discharge Bathroom Accessibility: Yes How Accessible: Accessible via walker Does the patient have any problems obtaining your medications?: No  Social/Family/Support Systems Patient Roles:  (lives with daughter) Sport and exercise psychologist Information: daughter: Teyana [phone] Anticipated Caregiver: Daughter + daughter in law Anticipated Caregiver's Contact Information: see above Ability/Limitations of Caregiver: supervision Caregiver Availability: Intermittent (close to 24/7-may be 2-3 hour stretches alone; family working to fill in the gap) Discharge Plan Discussed with Primary Caregiver: Yes Is Caregiver In Agreement with Plan?: Yes Does Caregiver/Family have Issues with Lodging/Transportation while Pt is in Rehab?: No  Goals Patient/Family Goal for Rehab: PT/OT: Mod I/Supervision; SLP NA Expected length of stay: 7-10 days Cultural Considerations: speaks [place] (daughter translates when present) Pt/Family Agrees to Admission and willing to participate: Yes Program Orientation Provided & Reviewed with Pt/Caregiver Including Roles  & Responsibilities: Yes  Barriers to Discharge: Home environment access/layout,New oxygen  Barriers to Discharge Comments: steps to enter; has new O2 needs.  Decrease burden of Care through IP rehab admission: Other NA  Possible need for SNF placement  upon discharge: Not anticipated; pt has good social support at DC. Anticipate pt can reach supervision level through a CIR level program.   Patient Condition: I have reviewed medical records from [facility], spoken with RN, and patient and daughter. I met with patient at the bedside for inpatient rehabilitation assessment.  Patient will benefit from ongoing PT and OT, can actively participate in 3 hours of therapy a day 5 days of the week, and can make measurable gains during the admission.  Patient will also benefit from the coordinated team approach during an Inpatient Acute Rehabilitation admission.  The patient will receive intensive therapy as well as Rehabilitation physician, nursing, social worker, and care management interventions.  Due to safety, disease management, medication administration, pain management and patient education the patient requires 24 hour a day rehabilitation nursing.  The patient is currently Min A +2 with mobility and set up to total A for basic ADLs.  Discharge setting and therapy  post discharge at home with home health is anticipated.  Patient has agreed to participate in the Acute Inpatient Rehabilitation Program and will admit 10/20/2020.  Preadmission Screen Completed By:  Raechel Ache, 10/20/2020 11:31 AM ______________________________________________________________________   Discussed status with Dr. Dagoberto Ligas on 10/20/2020 at 11:31AM and received approval for admission today.  Admission Coordinator:  Raechel Ache, OT, time 11:31AM/Date 10/20/2020   Assessment/Plan: Diagnosis: 1. Does the need for close, 24 hr/day Medical supervision in concert with the patient's rehab needs make it unreasonable for this patient to be served in a less intensive setting? Yes 2. Co-Morbidities requiring supervision/potential complications: Afib, previous CVA, DM, pubic rami fx 3. Due to bowel management, safety, skin/wound care, disease management, medication administration, pain  management and patient education, does the patient require 24 hr/day rehab nursing? Yes 4. Does the patient require coordinated care of a physician, rehab nurse, PT, OT, and SLP to address physical and functional deficits in the context of the above medical diagnosis(es)? Yes Addressing deficits in the following areas: balance, endurance, locomotion, strength, transferring, bathing, dressing, feeding, grooming and toileting 5. Can the patient actively participate in an intensive therapy program of at least 3 hrs of therapy 5 days a week? Yes 6. The potential for patient to make measurable gains while on inpatient rehab is good and fair 7. Anticipated functional outcomes upon discharge from inpatient rehab: modified independent and supervision PT, modified independent and supervision OT, n/a SLP 8. Estimated rehab length of stay to reach the above functional goals is: 7-10 days 9. Anticipated discharge destination: Home 10. Overall Rehab/Functional Prognosis: good and fair   MD Signature:

## 2020-10-19 NOTE — Progress Notes (Signed)
Transported to room 1229 with monitor and o2 and all belongings via bed with rapid response nurse, Insurance risk surveyor. Pts daughter phoned and updated. Translator used entire time and all pts questions answered.

## 2020-10-19 NOTE — Progress Notes (Signed)
At the 5 am hour, patient started feeling pressure in her chest. Put 2L of 02 on patient (as it states PRN in orders section). Paged [facility] Floor Coverage:   New orders: EKG STAT.   Results: Atrial fibrillation with a competing junctional pacemaker.   Vital signs taken prior to the EKG:  BP: 125/70 HR: 88 Sp02: 92% on 2 L 02.  Also, messaged pharmacy to tube the inhaler that is on patient's PRN list. Inhaler now in the patient's daily med bin if patient is feeling short winded.   Will notify Hospitalist to report EKG result.

## 2020-10-19 NOTE — Plan of Care (Signed)
  Problem: Pain Managment: Goal: General experience of comfort will improve Outcome: Progressing   

## 2020-10-19 NOTE — Progress Notes (Signed)
Glade Lloyd, MD notified of EKG results. EKG results placed in patient's chart.

## 2020-10-19 NOTE — Plan of Care (Signed)
Patient is given pain medication when needed,especially before transferring to the [facility] or chair.

## 2020-10-19 NOTE — Significant Event (Signed)
Rapid Response Event Note   Reason for Call : hypotension  Patient is [place] speaking. Upon arrival patient's BP was hypotensive systolic BP in the 80s. Patient was alert and able to follow commands via ipad translator. Patient knows self and that she is within the hospital in the [place] Patient is disoriented to date and time.  Daughter was notified.   Initial Focused Assessment:  Neuro: Alert and bale to follow commands, complaining of chest pain. Cardiac: Controlled Afib, having hypotension Pulmonary: Breath sounds clear and diminished in all lung fields. O2 Sats 97% on 2L.   Interventions:  MD Hanley Ben notified -500 cc bolus  -STAT troponins -Pain Medication  Plan of Care:  Transfer to Telemetry, no cardiac beds currently will transport to SDU for available cardiac tele monitoring.     Event Summary:   MD Notified: MD Hanley Ben  Call Time: 0730 Arrival Time: 6063 End Time: 0160  Henrene Hawking, RN

## 2020-10-20 ENCOUNTER — Inpatient Hospital Stay (HOSPITAL_COMMUNITY)
Admission: RE | Admit: 2020-10-20 | Discharge: 2020-11-09 | DRG: 560 | Disposition: A | Discharge status: Home Health | Payer: Medicare Other | Source: Other Acute Inpatient Hospital | Attending: Physical Medicine and Rehabilitation | Admitting: Physical Medicine and Rehabilitation

## 2020-10-20 ENCOUNTER — Encounter (HOSPITAL_COMMUNITY): Payer: Self-pay | Admitting: Physical Medicine and Rehabilitation

## 2020-10-20 ENCOUNTER — Other Ambulatory Visit: Payer: Self-pay

## 2020-10-20 DIAGNOSIS — S32810S Multiple fractures of pelvis with stable disruption of pelvic ring, sequela: Secondary | ICD-10-CM | POA: Diagnosis not present

## 2020-10-20 DIAGNOSIS — E781 Pure hyperglyceridemia: Secondary | ICD-10-CM | POA: Diagnosis present

## 2020-10-20 DIAGNOSIS — I4821 Permanent atrial fibrillation: Secondary | ICD-10-CM | POA: Diagnosis not present

## 2020-10-20 DIAGNOSIS — Z90710 Acquired absence of both cervix and uterus: Secondary | ICD-10-CM

## 2020-10-20 DIAGNOSIS — I208 Other forms of angina pectoris: Secondary | ICD-10-CM | POA: Diagnosis not present

## 2020-10-20 DIAGNOSIS — W1830XD Fall on same level, unspecified, subsequent encounter: Secondary | ICD-10-CM | POA: Diagnosis not present

## 2020-10-20 DIAGNOSIS — Z7984 Long term (current) use of oral hypoglycemic drugs: Secondary | ICD-10-CM

## 2020-10-20 DIAGNOSIS — I482 Chronic atrial fibrillation, unspecified: Secondary | ICD-10-CM | POA: Diagnosis present

## 2020-10-20 DIAGNOSIS — G8929 Other chronic pain: Secondary | ICD-10-CM | POA: Diagnosis present

## 2020-10-20 DIAGNOSIS — R3915 Urgency of urination: Secondary | ICD-10-CM | POA: Diagnosis present

## 2020-10-20 DIAGNOSIS — R072 Precordial pain: Secondary | ICD-10-CM | POA: Diagnosis not present

## 2020-10-20 DIAGNOSIS — Z7901 Long term (current) use of anticoagulants: Secondary | ICD-10-CM | POA: Diagnosis not present

## 2020-10-20 DIAGNOSIS — G8918 Other acute postprocedural pain: Secondary | ICD-10-CM | POA: Diagnosis not present

## 2020-10-20 DIAGNOSIS — I69351 Hemiplegia and hemiparesis following cerebral infarction affecting right dominant side: Secondary | ICD-10-CM | POA: Diagnosis not present

## 2020-10-20 DIAGNOSIS — K219 Gastro-esophageal reflux disease without esophagitis: Secondary | ICD-10-CM | POA: Diagnosis present

## 2020-10-20 DIAGNOSIS — I69354 Hemiplegia and hemiparesis following cerebral infarction affecting left non-dominant side: Secondary | ICD-10-CM | POA: Diagnosis not present

## 2020-10-20 DIAGNOSIS — E785 Hyperlipidemia, unspecified: Secondary | ICD-10-CM | POA: Diagnosis present

## 2020-10-20 DIAGNOSIS — E119 Type 2 diabetes mellitus without complications: Secondary | ICD-10-CM | POA: Diagnosis present

## 2020-10-20 DIAGNOSIS — K5900 Constipation, unspecified: Secondary | ICD-10-CM | POA: Diagnosis present

## 2020-10-20 DIAGNOSIS — R32 Unspecified urinary incontinence: Secondary | ICD-10-CM | POA: Diagnosis present

## 2020-10-20 DIAGNOSIS — S32501D Unspecified fracture of right pubis, subsequent encounter for fracture with routine healing: Secondary | ICD-10-CM | POA: Diagnosis not present

## 2020-10-20 DIAGNOSIS — Z79899 Other long term (current) drug therapy: Secondary | ICD-10-CM

## 2020-10-20 DIAGNOSIS — I10 Essential (primary) hypertension: Secondary | ICD-10-CM | POA: Diagnosis present

## 2020-10-20 DIAGNOSIS — R079 Chest pain, unspecified: Secondary | ICD-10-CM | POA: Diagnosis not present

## 2020-10-20 DIAGNOSIS — S32591A Other specified fracture of right pubis, initial encounter for closed fracture: Secondary | ICD-10-CM | POA: Diagnosis not present

## 2020-10-20 DIAGNOSIS — G4733 Obstructive sleep apnea (adult) (pediatric): Secondary | ICD-10-CM | POA: Diagnosis present

## 2020-10-20 DIAGNOSIS — S32591D Other specified fracture of right pubis, subsequent encounter for fracture with routine healing: Secondary | ICD-10-CM | POA: Diagnosis not present

## 2020-10-20 DIAGNOSIS — R350 Frequency of micturition: Secondary | ICD-10-CM | POA: Diagnosis present

## 2020-10-20 DIAGNOSIS — R630 Anorexia: Secondary | ICD-10-CM

## 2020-10-20 DIAGNOSIS — S329XXA Fracture of unspecified parts of lumbosacral spine and pelvis, initial encounter for closed fracture: Secondary | ICD-10-CM | POA: Diagnosis present

## 2020-10-20 LAB — GLUCOSE, CAPILLARY
Glucose-Capillary: 147 mg/dL — ABNORMAL HIGH (ref 70–99)
Glucose-Capillary: 142 mg/dL — ABNORMAL HIGH (ref 70–99)
Glucose-Capillary: 136 mg/dL — ABNORMAL HIGH (ref 70–99)
Glucose-Capillary: 128 mg/dL — ABNORMAL HIGH (ref 70–99)

## 2020-10-20 LAB — CBC WITH DIFFERENTIAL/PLATELET
Abs Immature Granulocytes: 0.08 10*3/uL — ABNORMAL HIGH (ref 0.00–0.07)
Basophils Absolute: 0.1 10*3/uL (ref 0.0–0.1)
Basophils Relative: 1 %
Eosinophils Absolute: 0.1 10*3/uL (ref 0.0–0.5)
Eosinophils Relative: 1 %
HCT: 32.6 % — ABNORMAL LOW (ref 36.0–46.0)
Hemoglobin: 10.5 g/dL — ABNORMAL LOW (ref 12.0–15.0)
Immature Granulocytes: 1 %
Lymphocytes Relative: 14 %
Lymphs Abs: 1.6 10*3/uL (ref 0.7–4.0)
MCH: 33.1 pg (ref 26.0–34.0)
MCHC: 32.2 g/dL (ref 30.0–36.0)
MCV: 102.8 fL — ABNORMAL HIGH (ref 80.0–100.0)
Monocytes Absolute: 1.2 10*3/uL — ABNORMAL HIGH (ref 0.1–1.0)
Monocytes Relative: 10 %
Neutro Abs: 8.4 10*3/uL — ABNORMAL HIGH (ref 1.7–7.7)
Neutrophils Relative %: 73 %
Platelets: 225 10*3/uL (ref 150–400)
RBC: 3.17 MIL/uL — ABNORMAL LOW (ref 3.87–5.11)
RDW: 12.3 % (ref 11.5–15.5)
WBC: 11.5 10*3/uL — ABNORMAL HIGH (ref 4.0–10.5)
nRBC: 0 % (ref 0.0–0.2)

## 2020-10-20 LAB — COMPREHENSIVE METABOLIC PANEL
ALT: 12 U/L (ref 0–44)
AST: 14 U/L — ABNORMAL LOW (ref 15–41)
Albumin: 3.5 g/dL (ref 3.5–5.0)
Alkaline Phosphatase: 32 U/L — ABNORMAL LOW (ref 38–126)
Anion gap: 8 (ref 5–15)
BUN: 19 mg/dL (ref 8–23)
CO2: 24 mmol/L (ref 22–32)
Calcium: 8.3 mg/dL — ABNORMAL LOW (ref 8.9–10.3)
Chloride: 107 mmol/L (ref 98–111)
Creatinine, Ser: 0.64 mg/dL (ref 0.44–1.00)
GFR, Estimated: >60 mL/min (ref >60)
Glucose, Bld: 154 mg/dL — ABNORMAL HIGH (ref 70–99)
Potassium: 4.3 mmol/L (ref 3.5–5.1)
Sodium: 139 mmol/L (ref 135–145)
Total Bilirubin: 0.6 mg/dL (ref 0.3–1.2)
Total Protein: 6.1 g/dL — ABNORMAL LOW (ref 6.5–8.1)

## 2020-10-20 LAB — MAGNESIUM: Magnesium: 2.1 mg/dL (ref 1.7–2.4)

## 2020-10-20 MED ORDER — MUPIROCIN 2 % EX OINT: 1.0000 "application " | TOPICAL_OINTMENT | Freq: Two times a day (BID) | CUTANEOUS | Status: DC

## 2020-10-20 MED ORDER — OXYCODONE-ACETAMINOPHEN 5-325 MG PO TABS
1.0000 | ORAL_TABLET | ORAL | Status: DC | PRN
  Administered 2020-10-20 – 2020-10-30 (×22): 1 via ORAL
  Filled 2020-10-20 (×23): qty 1

## 2020-10-20 MED ORDER — PANTOPRAZOLE SODIUM 40 MG PO TBEC
40.0000 mg | DELAYED_RELEASE_TABLET | Freq: Every day | ORAL | Status: DC
  Administered 2020-10-20: 40 mg via ORAL
  Filled 2020-10-20: qty 1

## 2020-10-20 MED ORDER — ACETAMINOPHEN 325 MG PO TABS
650.0000 mg | ORAL_TABLET | Freq: Four times a day (QID) | ORAL | Status: DC | PRN
  Administered 2020-10-21 – 2020-11-07 (×7): 650 mg via ORAL
  Filled 2020-10-20 (×7): qty 2

## 2020-10-20 MED ORDER — SENNOSIDES-DOCUSATE SODIUM 8.6-50 MG PO TABS
1.0000 | ORAL_TABLET | Freq: Two times a day (BID) | ORAL | Status: DC
  Administered 2020-10-20: 1 via ORAL
  Filled 2020-10-20: qty 1

## 2020-10-20 MED ORDER — FLUTICASONE PROPIONATE 50 MCG/ACT NA SUSP: 1.0000 | Freq: Every day | NASAL | Status: DC | PRN

## 2020-10-20 MED ORDER — PANTOPRAZOLE SODIUM 40 MG PO TBEC: 40.0000 mg | DELAYED_RELEASE_TABLET | Freq: Every day | ORAL | Status: DC

## 2020-10-20 MED ORDER — POLYETHYLENE GLYCOL 3350 17 G PO PACK: 17.0000 g | PACK | Freq: Every day | ORAL | Status: DC | PRN

## 2020-10-20 MED ORDER — BISACODYL 10 MG RE SUPP: 10.0000 mg | Freq: Every day | RECTAL | Status: DC | PRN

## 2020-10-20 MED ORDER — MIRABEGRON ER 25 MG PO TB24
50.0000 mg | ORAL_TABLET | Freq: Every day | ORAL | Status: DC
  Administered 2020-10-20 – 2020-11-08 (×19): 50 mg via ORAL
  Filled 2020-10-20 (×19): qty 2

## 2020-10-20 MED ORDER — OXYCODONE-ACETAMINOPHEN 5-325 MG PO TABS: 1.0000 | ORAL_TABLET | ORAL | Status: DC | PRN

## 2020-10-20 MED ORDER — TIZANIDINE HCL 2 MG PO TABS
2.0000 mg | ORAL_TABLET | Freq: Three times a day (TID) | ORAL | Status: DC | PRN
  Administered 2020-10-21 – 2020-11-08 (×11): 2 mg via ORAL
  Filled 2020-10-20 (×15): qty 1

## 2020-10-20 MED ORDER — GABAPENTIN 250 MG/5ML PO SOLN
100.0000 mg | Freq: Every evening | ORAL | Status: DC | PRN
  Filled 2020-10-20: qty 2

## 2020-10-20 MED ORDER — METOPROLOL TARTRATE 12.5 MG HALF TABLET
12.5000 mg | ORAL_TABLET | Freq: Two times a day (BID) | ORAL | Status: DC
  Administered 2020-10-20 – 2020-10-25 (×8): 12.5 mg via ORAL
  Filled 2020-10-20 (×10): qty 1

## 2020-10-20 MED ORDER — ALUM & MAG HYDROXIDE-SIMETH 200-200-20 MG/5ML PO SUSP: 15.0000 mL | ORAL | Status: DC | PRN

## 2020-10-20 MED ORDER — PRAVASTATIN SODIUM 10 MG PO TABS
20.0000 mg | ORAL_TABLET | Freq: Every day | ORAL | Status: DC
  Administered 2020-10-20 – 2020-11-08 (×19): 20 mg via ORAL
  Filled 2020-10-20 (×20): qty 2

## 2020-10-20 MED ORDER — BUTALBITAL-APAP-CAFFEINE 50-325-40 MG PO TABS: 1.0000 | ORAL_TABLET | Freq: Two times a day (BID) | ORAL | Status: DC | PRN

## 2020-10-20 MED ORDER — LIDOCAINE 5 % EX PTCH: 2.0000 | MEDICATED_PATCH | Freq: Every day | CUTANEOUS | Status: DC | PRN

## 2020-10-20 MED ORDER — APIXABAN 5 MG PO TABS
5.0000 mg | ORAL_TABLET | Freq: Two times a day (BID) | ORAL | Status: DC
  Administered 2020-10-20 – 2020-11-09 (×40): 5 mg via ORAL
  Filled 2020-10-20 (×40): qty 1

## 2020-10-20 MED ORDER — TIZANIDINE HCL 2 MG PO TABS: 2.0000 mg | ORAL_TABLET | Freq: Three times a day (TID) | ORAL | Status: DC | PRN

## 2020-10-20 MED ORDER — SENNOSIDES-DOCUSATE SODIUM 8.6-50 MG PO TABS
1.0000 | ORAL_TABLET | Freq: Two times a day (BID) | ORAL | Status: DC
  Administered 2020-10-20 – 2020-11-05 (×30): 1 via ORAL
  Filled 2020-10-20 (×32): qty 1

## 2020-10-20 MED ORDER — VITAMIN D 25 MCG (1000 UNIT) PO TABS
2000.0000 [IU] | ORAL_TABLET | Freq: Every day | ORAL | Status: DC
  Administered 2020-10-21 – 2020-11-09 (×19): 2000 [IU] via ORAL
  Filled 2020-10-20 (×20): qty 2

## 2020-10-20 MED ORDER — LEVALBUTEROL TARTRATE 45 MCG/ACT IN AERO
1.0000 | INHALATION_SPRAY | RESPIRATORY_TRACT | Status: DC | PRN
  Administered 2020-10-27 – 2020-10-28 (×2): 1 via RESPIRATORY_TRACT
  Filled 2020-10-20: qty 15

## 2020-10-20 MED ORDER — TRAMADOL HCL 50 MG PO TABS
50.0000 mg | ORAL_TABLET | Freq: Four times a day (QID) | ORAL | Status: DC | PRN
  Administered 2020-10-21 – 2020-11-08 (×18): 50 mg via ORAL
  Filled 2020-10-20 (×20): qty 1

## 2020-10-20 MED ORDER — METFORMIN HCL 500 MG PO TABS: 500.0000 mg | ORAL_TABLET | Freq: Two times a day (BID) | ORAL | Status: DC

## 2020-10-20 MED ORDER — NITROGLYCERIN 0.4 MG SL SUBL
0.4000 mg | SUBLINGUAL_TABLET | SUBLINGUAL | Status: DC | PRN
  Administered 2020-10-23 – 2020-10-25 (×4): 0.4 mg via SUBLINGUAL
  Filled 2020-10-20 (×7): qty 1

## 2020-10-20 MED ORDER — VITAMIN D 25 MCG (1000 UNIT) PO TABS: 2000.0000 [IU] | ORAL_TABLET | Freq: Every day | ORAL | Status: DC

## 2020-10-20 MED ORDER — METFORMIN HCL 500 MG PO TABS
500.0000 mg | ORAL_TABLET | Freq: Two times a day (BID) | ORAL | Status: DC
  Administered 2020-10-20 – 2020-11-05 (×31): 500 mg via ORAL
  Filled 2020-10-20 (×32): qty 1

## 2020-10-20 MED ORDER — INSULIN ASPART 100 UNIT/ML ~~LOC~~ SOLN: 0.0000 [IU] | Freq: Three times a day (TID) | SUBCUTANEOUS | Status: DC

## 2020-10-20 NOTE — Discharge Summary (Signed)
Physician Discharge Summary  [facility] EXB:[id number] DOB: 02/27/1941 DOA: 10/17/2020  PCP: Ermalinda Memosowlen, Hugh, MD  Admit date: 10/17/2020 Discharge date: 10/20/2020  Admitted From: Home Disposition: CIR  Recommendations for Outpatient Follow-up:  1. Follow up with CIR provider at earliest convenience 2. Outpatient follow-up with orthopedic 3. Outpatient follow-up with PCP/cardiology 4. Follow up in ED if symptoms worsen or new appear   Home Health: No Equipment/Devices: None  Discharge Condition: Stable CODE STATUS: Full Diet recommendation: Regular diet. Patient/Daughter requesting regular diet  Brief/Interim Summary: 80 year old female with history of chronic A. fib, chronic back pain, GERD, recurrent falls, OSA on CPAP, hyperlipidemia, diabetes mellitus type 2, chronic anticoagulation, history of SDH after a fall on Coumadin, history of unspecified CVA 4 years ago, T12 compression fracture presented with a mechanical fall while she was trying to get out of the door.  She was found to have severe hip pain.  On presentation, she was found to have right pubic rami fracture.  Orthopedics was consulted by ED provider who recommended conservative measures with pain control and weightbearing as tolerated. PT recommended CIR.  She will be discharged to CIR once bed is available.  Discharge Diagnoses:   Closed fracture of right pubic rami following a mechanical fall -Patient presented with severe pain following a fall and CT pelvis showing no fracture of right pubic rami.  ED provider consulted orthopedics who recommended conservative measures with pain management and weightbearing as tolerated.  Continue current pain management.  Continue stool softeners/laxatives as needed.  Monitor mental status. -PT recommended CIR.  Patient and family agreeable for CIR.   - She will be discharged to CIR once bed is available.   Chest pain -Patient had chest pain in the morning of 10/19/2020.  EKG nonischemic.   Troponins x2 were flat -currently chest pain free. Outpatient followup with Cardiology -Patient was empirically started on IV Protonix twice a day yesterday.  Switch to oral Protonix once a day for now.  Leukocytosis -Probably reactive.  Improving  Chronic A. fib -Currently rate controlled.  Held Lopressor because of low blood pressure.   Resume Lopressor if blood pressure stable and heart rate becomes an issue. Continue Eliquis  Dyslipidemia -Continue Pravachol  Diabetes mellitus type II, uncontrolled with neuropathy -Resume Metformin.  Regular diet as per patient's request  History of unspecified CVA -Continue Eliquis.  Continue statin  Hyperlipidemia -Continue statin  Discharge Instructions  Discharge Instructions    Diet general   Complete by: As directed    Increase activity slowly   Complete by: As directed      Allergies as of 10/20/2020      Reactions   Lactose Intolerance (gi) Diarrhea   Banana Swelling   Hands and feet   Peanut-containing Drug Products Itching   Throat itches, no swelling   Chocolate Itching      Medication List    STOP taking these medications   acetaminophen 500 MG tablet Commonly known as: TYLENOL   chlorpheniramine-HYDROcodone 10-8 MG/5ML Suer Commonly known as: TUSSIONEX   HYDROcodone-acetaminophen 5-325 MG tablet Commonly known as: NORCO/VICODIN Replaced by: oxyCODONE-acetaminophen 5-325 MG tablet   metoprolol tartrate 25 MG tablet Commonly known as: LOPRESSOR   traMADol 50 MG tablet Commonly known as: ULTRAM     TAKE these medications   albuterol (2.5 MG/3ML) 0.083% nebulizer solution Commonly known as: PROVENTIL Take 2.5 mg every 6 (six) hours as needed by nebulization for wheezing or shortness of breath.   alum & mag hydroxide-simeth 200-200-20 MG/5ML  suspension Commonly known as: MAALOX/MYLANTA Take 15 mLs by mouth every 4 (four) hours as needed for indigestion or heartburn (chest pain not relieved by  NTG/morphine).   apixaban 5 MG Tabs tablet Commonly known as: ELIQUIS Take 5 mg 2 (two) times daily by mouth.   cetirizine 10 MG tablet Commonly known as: ZYRTEC Take 10 mg daily as needed by mouth (seasonal allergies).   fluticasone 50 MCG/ACT nasal spray Commonly known as: FLONASE Place 1 spray daily as needed into both nostrils (seasonal allergies).   gabapentin 250 MG/5ML solution Commonly known as: NEURONTIN Take 100 mg by mouth at bedtime as needed (neuropathy).   hydrOXYzine 10 MG tablet Commonly known as: ATARAX/VISTARIL Take 10 mg by mouth every 8 (eight) hours as needed for itching.   Lidocaine 4 % Ptch Apply 2 patches topically daily as needed (pain).   metFORMIN 500 MG tablet Commonly known as: GLUCOPHAGE Take 500 mg by mouth 2 (two) times daily.   oxyCODONE-acetaminophen 5-325 MG tablet Commonly known as: PERCOCET/ROXICET Take 1 tablet by mouth every 4 (four) hours as needed for moderate pain. Replaces: HYDROcodone-acetaminophen 5-325 MG tablet   pantoprazole 40 MG tablet Commonly known as: PROTONIX Take 1 tablet (40 mg total) by mouth daily. Start taking on: October 21, 2020   polyethylene glycol 17 g packet Commonly known as: MIRALAX / GLYCOLAX Take 17 g by mouth daily as needed for mild constipation.   pravastatin 20 MG tablet Commonly known as: PRAVACHOL Take 20 mg by mouth at bedtime.   SALONPAS ARTHRITIS PAIN RELIEF EX Apply 2 patches topically daily as needed (pain).   senna-docusate 8.6-50 MG tablet Commonly known as: Senokot-S Take 2 tablets 2 (two) times daily as needed by mouth (while taking narcotic pain medication).   tiZANidine 2 MG tablet Commonly known as: ZANAFLEX Take 1 tablet (2 mg total) by mouth every 8 (eight) hours as needed for muscle spasms. What changed:   medication strength  See the new instructions.   Vitamin D 50 MCG (2000 UT) tablet Take 2,000 Units by mouth daily.       Follow-up Information    Teryl LucyLandau,  Joshua, MD. Schedule an appointment as soon as possible for a visit in 2 week(s).   Specialty: Orthopedic Surgery Contact information: [address] [place] [address] [phone]        Ermalinda Memosowlen, Hugh, MD. Schedule an appointment as soon as possible for a visit in 1 week(s).   Specialty: Internal Medicine Contact information: [address] [place] [address] [phone]              Allergies  Allergen Reactions  . Lactose Intolerance (Gi) Diarrhea  . Banana Swelling    Hands and feet  . Peanut-Containing Drug Products Itching    Throat itches, no swelling  . Chocolate Itching    Consultations:  Orthopedics   Procedures/Studies: DG Pelvis 1-2 Views  Result Date: 10/17/2020 CLINICAL DATA:  Fall with right hip pain EXAM: PELVIS - 1-2 VIEW COMPARISON:  01/16/2020 FINDINGS: Both femoral heads project in joint. Pubic symphysis is intact. Suspected acute fractures of the right superior and inferior pubic rami. There may be associated lucent lesion within the right inferior pubic ramus. IMPRESSION: Suspected acute fractures of the right superior and inferior pubic rami with possible associated lucent lesion in the right inferior pubic ramus. Electronically Signed   By: Jasmine PangKim  Fujinaga M.D.   On: 10/17/2020 17:46   DG Forearm Right  Result Date:  10/17/2020 CLINICAL DATA:  Right forearm pain after fall EXAM: RIGHT FOREARM - 2 VIEW COMPARISON:  None. FINDINGS: There is no evidence of fracture or other focal bone lesions. Soft tissues are unremarkable. IMPRESSION: Negative. Electronically Signed   By: Duanne Guess D.O.   On: 10/17/2020 17:44   CT Head Wo Contrast  Result Date: 10/17/2020 CLINICAL DATA:  Fall.  Anticoagulated EXAM: CT HEAD WITHOUT CONTRAST TECHNIQUE: Contiguous axial images were obtained from the base of the skull through the vertex without intravenous contrast. COMPARISON:  None. FINDINGS: Brain: Diffuse cerebral atrophy.  Ventricular dilatation consistent with central atrophy. Low-attenuation changes in the deep white matter consistent small vessel ischemic change. Old lacunar infarcts in the deep white matter. No mass effect or midline shift. No abnormal extra-axial fluid collections. Gray-white matter junctions are distinct. Basal cisterns are not effaced. No acute intracranial hemorrhage. Basal ganglia calcifications. Vascular: Moderate intracranial arterial vascular calcifications. Skull: Calvarium appears intact. Sinuses/Orbits: Paranasal sinuses and mastoid air cells are clear. Other: None. IMPRESSION: 1. No acute intracranial abnormalities. 2. Chronic atrophy and small vessel ischemic changes. Old lacunar infarcts in the deep white matter. Electronically Signed   By: Burman Nieves M.D.   On: 10/17/2020 17:09   CT PELVIS WO CONTRAST  Result Date: 10/17/2020 CLINICAL DATA:  Fall. EXAM: CT PELVIS WITHOUT CONTRAST TECHNIQUE: Multidetector CT imaging of the pelvis was performed following the standard protocol without intravenous contrast. COMPARISON:  January 18, 2008. FINDINGS: Urinary Tract:  No abnormality visualized. Bowel:  Unremarkable visualized pelvic bowel loops. Vascular/Lymphatic: No pathologically enlarged lymph nodes. No significant vascular abnormality seen. Reproductive: Status post hysterectomy. No adnexal abnormality is noted. Other:  No hernia or abnormal fluid collection is noted. Musculoskeletal: Moderately displaced and comminuted fracture is seen involving the right inferior pubic ramus. There is a nondisplaced fracture involving the right superior pubic ramus at its junction with the anterior acetabulum. IMPRESSION: 1. Moderately displaced and comminuted fracture is seen involving the right inferior pubic ramus. 2. Nondisplaced fracture involving the right superior pubic ramus at its junction with the anterior acetabulum. Electronically Signed   By: Lupita Raider M.D.   On: 10/17/2020 18:21   DG CHEST  PORT 1 VIEW  Result Date: 10/19/2020 CLINICAL DATA:  Shortness of breath. EXAM: PORTABLE CHEST 1 VIEW COMPARISON:  02/12/2017 and CT chest 05/10/2019. FINDINGS: Trachea is midline. Heart is enlarged. Thoracic aorta is calcified. Lungs are low in volume with mild interstitial prominence and indistinctness. There may be left infrahilar airspace consolidation. Suspect a tiny left pleural effusion. IMPRESSION: 1. Low lung volumes with pulmonary vascular congestion and a tiny left pleural effusion. 2. Left infrahilar airspace opacification may be due to atelectasis. Difficult to exclude pneumonia. 3.  Aortic atherosclerosis (ICD10-I70.0). Electronically Signed   By: Leanna Battles M.D.   On: 10/19/2020 07:57   DG Knee Right Port  Result Date: 10/17/2020 CLINICAL DATA:  Fall with right knee pain EXAM: PORTABLE RIGHT KNEE - 1-2 VIEW COMPARISON:  None. FINDINGS: No acute displaced fracture or malalignment. Trace knee effusion. Mild tricompartment arthritis of the knee. IMPRESSION: No acute osseous abnormality. Electronically Signed   By: Jasmine Pang M.D.   On: 10/17/2020 19:55   DG Abd Portable 1V  Result Date: 10/17/2020 CLINICAL DATA:  Right knee pain EXAM: PORTABLE ABDOMEN - 1 VIEW COMPARISON:  None. FINDINGS: The bowel gas pattern is normal. No radio-opaque calculi or other significant radiographic abnormality are seen. IMPRESSION: Negative. Electronically Signed   By: Heywood Bene.D.  On: 10/17/2020 19:57   DG Hand Complete Right  Result Date: 10/17/2020 CLINICAL DATA:  Right hand pain after fall EXAM: RIGHT HAND - COMPLETE 3+ VIEW COMPARISON:  None. FINDINGS: No acute fracture. No dislocation. Mild degenerative changes throughout the hand and wrist. No focal erosion. Soft tissues within normal limits. IMPRESSION: No acute findings. Mild degenerative changes. Electronically Signed   By: Duanne Guess D.O.   On: 10/17/2020 17:43   DG FEMUR, MIN 2 VIEWS RIGHT  Result Date: 10/17/2020 CLINICAL DATA:   Fall with right hip pain EXAM: RIGHT FEMUR 2 VIEWS COMPARISON:  01/16/2020 FINDINGS: No fracture or malalignment of the right femur. The right femoral head projects in joint. Suspected acute fractures involving the right superior and inferior pubic ramus. Questionable subtle lucent lesion in the right inferior pubic ramus. IMPRESSION: 1. Suspected acute fractures involving the right superior and inferior pubic rami. 2. Possible lucent lesion in the right inferior pubic ramus, raising concern for pathologic fracture Electronically Signed   By: Jasmine Pang M.D.   On: 10/17/2020 17:45       Subjective: Patient seen and examined at bedside.  Daughter present at bedside who translates for the patient.  Denies current chest pain.  Feels better than yesterday.  Daughter is requesting for regular diet since the patient is not eating much of the current diet.  No worsening shortness of breath, fever or vomiting reported.     Discharge Exam: Vitals:   10/20/20 0429 10/20/20 1039  BP: 101/75 111/75  Pulse: 93 87  Resp: 20 18  Temp: 99.4 F (37.4 C)   SpO2: 91%      General exam: No acute distress.  Chronically ill.  Poor historian.  Currently on room air.  Respiratory system: Bilateral decreased breath sounds at bases.  No wheezing cardiovascular system: S1-S2 heard, rate controlled Gastrointestinal system: Abdomen is nondistended, soft and nontender.  Normal bowel sounds are heard  extremities: No clubbing.  Trace lower extremity edema present    The results of significant diagnostics from this hospitalization (including imaging, microbiology, ancillary and laboratory) are listed below for reference.     Microbiology: Recent Results (from the past 240 hour(s))  Resp Panel by RT-PCR (Flu A&B, Covid) Nasopharyngeal Swab     Status: None   Collection Time: 10/17/20  6:38 PM   Specimen: Nasopharyngeal Swab; Nasopharyngeal(NP) swabs in vial transport medium  Result Value Ref Range Status    SARS Coronavirus 2 by RT PCR NEGATIVE NEGATIVE Final    Comment: (NOTE) SARS-CoV-2 target nucleic acids are NOT DETECTED.  The SARS-CoV-2 RNA is generally detectable in upper respiratory specimens during the acute phase of infection. The lowest concentration of SARS-CoV-2 viral copies this assay can detect is 138 copies/mL. A negative result does not preclude SARS-Cov-2 infection and should not be used as the sole basis for treatment or other patient management decisions. A negative result may occur with  improper specimen collection/handling, submission of specimen other than nasopharyngeal swab, presence of viral mutation(s) within the areas targeted by this assay, and inadequate number of viral copies(<138 copies/mL). A negative result must be combined with clinical observations, patient history, and epidemiological information. The expected result is Negative.  Fact Sheet for Patients:  BloggerCourse.com  Fact Sheet for Healthcare Providers:  SeriousBroker.it  This test is no t yet approved or cleared by the [place] FDA and  has been authorized for detection and/or diagnosis of SARS-CoV-2 by FDA under an Emergency Use Authorization (EUA). This EUA  will remain  in effect (meaning this test can be used) for the duration of the COVID-19 declaration under Section 564(b)(1) of the Act, 21 U.S.C.section 360bbb-3(b)(1), unless the authorization is terminated  or revoked sooner.       Influenza A by PCR NEGATIVE NEGATIVE Final   Influenza B by PCR NEGATIVE NEGATIVE Final    Comment: (NOTE) The Xpert Xpress SARS-CoV-2/FLU/RSV plus assay is intended as an aid in the diagnosis of influenza from Nasopharyngeal swab specimens and should not be used as a sole basis for treatment. Nasal washings and aspirates are unacceptable for Xpert Xpress SARS-CoV-2/FLU/RSV testing.  Fact Sheet for  Patients: BloggerCourse.com  Fact Sheet for Healthcare Providers: SeriousBroker.it  This test is not yet approved or cleared by the [place] FDA and has been authorized for detection and/or diagnosis of SARS-CoV-2 by FDA under an Emergency Use Authorization (EUA). This EUA will remain in effect (meaning this test can be used) for the duration of the COVID-19 declaration under Section 564(b)(1) of the Act, 21 U.S.C. section 360bbb-3(b)(1), unless the authorization is terminated or revoked.  Performed at [facility], [address], [place], [place] [address]   MRSA PCR Screening     Status: None   Collection Time: 10/19/20  8:20 AM   Specimen: Nasal Mucosa; Nasopharyngeal  Result Value Ref Range Status   MRSA by PCR NEGATIVE NEGATIVE Final    Comment:        The GeneXpert MRSA Assay (FDA approved for NASAL specimens only), is one component of a comprehensive MRSA colonization surveillance program. It is not intended to diagnose MRSA infection nor to guide or monitor treatment for MRSA infections. Performed at [facility], [address] [place], [place] [address]      Labs: BNP (last 3 results) No results for input(s): BNP in the last 8760 hours. Basic Metabolic Panel: Recent Labs  Lab 10/17/20 1824 10/19/20 0314 10/20/20 0453  NA 142 136 139  K 4.0 3.9 4.3  CL 103 103 107  CO2 27 22 24   GLUCOSE 138* 183* 154*  BUN 17 39* 19  CREATININE 0.75 1.09* 0.64  CALCIUM 9.7 8.4* 8.3*  MG  --  2.1 2.1   Liver Function Tests: Recent Labs  Lab 10/17/20 1824 10/20/20 0453  AST 25 14*  ALT 19 12  ALKPHOS 39 32*  BILITOT 0.6 0.6  PROT 7.5 6.1*  ALBUMIN 4.7 3.5   No results for input(s): LIPASE, AMYLASE in the last 168 hours. No results for input(s): AMMONIA in the last 168 hours. CBC: Recent Labs  Lab 10/17/20 1824 10/19/20 0314 10/20/20 0453  WBC 12.1* 12.7* 11.5*   NEUTROABS 9.7* 10.4* 8.4*  HGB 13.3 10.7* 10.5*  HCT 40.5 33.6* 32.6*  MCV 100.7* 103.7* 102.8*  PLT 301 217 225   Cardiac Enzymes: No results for input(s): CKTOTAL, CKMB, CKMBINDEX, TROPONINI in the last 168 hours. BNP: Invalid input(s): POCBNP CBG: Recent Labs  Lab 10/19/20 0844 10/19/20 1148 10/19/20 1632 10/19/20 2048 10/20/20 0735  GLUCAP 152* 172* 150* 194* 147*   D-Dimer No results for input(s): DDIMER in the last 72 hours. Hgb A1c Recent Labs    10/18/20 0300 10/18/20 0638  HGBA1C 6.5* 6.4*   Lipid Profile No results for input(s): CHOL, HDL, LDLCALC, TRIG, CHOLHDL, LDLDIRECT in the last 72 hours. Thyroid function studies No results for input(s): TSH, T4TOTAL, T3FREE, THYROIDAB in the last 72 hours.  Invalid input(s): FREET3 Anemia work up No results for input(s):  VITAMINB12, FOLATE, FERRITIN, TIBC, IRON, RETICCTPCT in the last 72 hours. Urinalysis    Component Value Date/Time   COLORURINE YELLOW 02/12/2018 2250   APPEARANCEUR CLEAR 02/12/2018 2250   LABSPEC 1.014 02/12/2018 2250   PHURINE 5.0 02/12/2018 2250   GLUCOSEU 50 (A) 02/12/2018 2250   HGBUR SMALL (A) 02/12/2018 2250   BILIRUBINUR NEGATIVE 02/12/2018 2250   KETONESUR 20 (A) 02/12/2018 2250   PROTEINUR NEGATIVE 02/12/2018 2250   UROBILINOGEN 0.2 02/11/2017 1601   NITRITE NEGATIVE 02/12/2018 2250   LEUKOCYTESUR NEGATIVE 02/12/2018 2250   Sepsis Labs Invalid input(s): PROCALCITONIN,  WBC,  LACTICIDVEN Microbiology Recent Results (from the past 240 hour(s))  Resp Panel by RT-PCR (Flu A&B, Covid) Nasopharyngeal Swab     Status: None   Collection Time: 10/17/20  6:38 PM   Specimen: Nasopharyngeal Swab; Nasopharyngeal(NP) swabs in vial transport medium  Result Value Ref Range Status   SARS Coronavirus 2 by RT PCR NEGATIVE NEGATIVE Final    Comment: (NOTE) SARS-CoV-2 target nucleic acids are NOT DETECTED.  The SARS-CoV-2 RNA is generally detectable in upper respiratory specimens during the  acute phase of infection. The lowest concentration of SARS-CoV-2 viral copies this assay can detect is 138 copies/mL. A negative result does not preclude SARS-Cov-2 infection and should not be used as the sole basis for treatment or other patient management decisions. A negative result may occur with  improper specimen collection/handling, submission of specimen other than nasopharyngeal swab, presence of viral mutation(s) within the areas targeted by this assay, and inadequate number of viral copies(<138 copies/mL). A negative result must be combined with clinical observations, patient history, and epidemiological information. The expected result is Negative.  Fact Sheet for Patients:  BloggerCourse.com  Fact Sheet for Healthcare Providers:  SeriousBroker.it  This test is no t yet approved or cleared by the [place] FDA and  has been authorized for detection and/or diagnosis of SARS-CoV-2 by FDA under an Emergency Use Authorization (EUA). This EUA will remain  in effect (meaning this test can be used) for the duration of the COVID-19 declaration under Section 564(b)(1) of the Act, 21 U.S.C.section 360bbb-3(b)(1), unless the authorization is terminated  or revoked sooner.       Influenza A by PCR NEGATIVE NEGATIVE Final   Influenza B by PCR NEGATIVE NEGATIVE Final    Comment: (NOTE) The Xpert Xpress SARS-CoV-2/FLU/RSV plus assay is intended as an aid in the diagnosis of influenza from Nasopharyngeal swab specimens and should not be used as a sole basis for treatment. Nasal washings and aspirates are unacceptable for Xpert Xpress SARS-CoV-2/FLU/RSV testing.  Fact Sheet for Patients: BloggerCourse.com  Fact Sheet for Healthcare Providers: SeriousBroker.it  This test is not yet approved or cleared by the [place] FDA and has been authorized for detection and/or  diagnosis of SARS-CoV-2 by FDA under an Emergency Use Authorization (EUA). This EUA will remain in effect (meaning this test can be used) for the duration of the COVID-19 declaration under Section 564(b)(1) of the Act, 21 U.S.C. section 360bbb-3(b)(1), unless the authorization is terminated or revoked.  Performed at [facility], [address] [place], [place] [address]   MRSA PCR Screening     Status: None   Collection Time: 10/19/20  8:20 AM   Specimen: Nasal Mucosa; Nasopharyngeal  Result Value Ref Range Status   MRSA by PCR NEGATIVE NEGATIVE Final    Comment:        The GeneXpert MRSA Assay (FDA approved for NASAL specimens only), is one  component of a comprehensive MRSA colonization surveillance program. It is not intended to diagnose MRSA infection nor to guide or monitor treatment for MRSA infections. Performed at [facility], [address], [place], [place] [address]      Time coordinating discharge: 35 minutes  SIGNED:   Aline August, MD  [facility] 10/20/2020, 11:04 AM

## 2020-10-20 NOTE — Progress Notes (Signed)
Inpatient Rehabilitation  Patient information reviewed and entered into eRehab system by Jannelle Notaro M. Ryiah Bellissimo, M.A., CCC/SLP, PPS Coordinator.  Information including medical coding, functional ability and quality indicators will be reviewed and updated through discharge.    

## 2020-10-20 NOTE — TOC Transition Note (Signed)
Transition of Care [facility]) - CM/SW Discharge Note   Patient Details  Name: [name] MRN: [id number] Date of Birth: 08/13/1941  [facility] CM/SW Contact:  Lanier Clam, RN Phone Number: 10/20/2020, 11:31 AM   Clinical Narrative:d/c today CIR. No further CM needs.       Final next level of care: IP Rehab Facility Barriers to Discharge: No Barriers Identified   Patient Goals and CMS Choice        Discharge Placement                       Discharge Plan and Services                                     Social Determinants of Health (SDOH) Interventions     Readmission Risk Interventions No flowsheet data found.

## 2020-10-20 NOTE — Progress Notes (Signed)
[name], MD  Physician  Physical Medicine and Rehabilitation  PMR Pre-admission     Signed  Date of Service:  10/19/2020  8:14 PM      Related encounter: ED to Hosp-Admission (Discharged) from 10/17/2020 in [place]       Signed          Show:Clear all _0 Manual_1 Template_2 Copied  Added by: _3 Raechel Ache, Argie Ramming, MD   _5 Hover for details  PMR Admission Coordinator Pre-Admission Assessment   Patient: [name] is an 80 y.o., female MRN: [id number] DOB: [birth year] Height: _6  (147.3 cm) Weight: 68.1 kg   Insurance Information HMO:     PPO:      PCP:      IPA:      80/20: yes     OTHER:  PRIMARY: Medicare A and B      Policy#: [id number]      Subscriber: patient CM Name:       Phone#:      Fax#:  Pre-Cert#:       Employer:  Benefits:  Phone #: verified online     Name: verified eligibility via [employer] on 10/20/20 Eff. Date: part A effective 12/16/2006; Part B effective 08/17/2006     Deduct: $1,556      Out of Pocket Max: NA      Life Max: NA CIR: Covered per Medicare guidelines once yearly deductible has been met      SNF: days 1-20, 100%; days 21-100, 80% Outpatient: 80%     Co-Pay: 20% Home Health: 100%      Co-Pay:  DME: 80%     Co-Pay: 20% Providers: Pt's choice *Pt has qualified medicare beneficiary  SECONDARY: Medicaid [place]      QIONGE#:[id number] T      Phone#: [phone]   Financial Counselor:       Phone#:    The "Data Collection Information Summary" for patients in Inpatient Rehabilitation Facilities with attached "Privacy Act [place] Records" was provided and verbally reviewed with: Family   Emergency Contact Information         Contact Information     Name Relation Home Work Mobile    Lieu,Senaida Daughter     [phone]    Nguyen,(SIL)[name] Relative     [phone]    Sylvie Farrier     [phone]    Eliseo Gum Other     [phone]         Current Medical  History  Patient Admitting Diagnosis: closed fracture of right pubic rami following a mechanical fall   History of Present Illness: [name] is a 80 year old right-handed [place] speaking female with history of chronic atrial fibrillation currently maintained on Eliquis.  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  Per chart review patient lives with her daughter and son-in-law.  She also has other family in the area.  Two-level home bed and bath main level 2 steps to entry.  Independent with assistive device using a rolling walker as well is reportedly independent with ADLs.  Presented 10/17/2020 after mechanical fall while using her walker landing on her right hip.  No loss of consciousness.  Cranial CT scan negative for acute changes.  X-rays and imaging revealed moderately displaced and comminuted  fracture involving the right inferior pubic ramus.  Nondisplaced fracture involving the right superior pubic ramus at its junction with the anterior acetabulum.  Orthopedic service follow-up Dr. Mardelle Matte conservative care no surgical intervention weightbearing as tolerated.  Admission chemistries WBC 12,100, glucose 138, hemoglobin A1c 6.5, troponin negative.  She currently remains on Eliquis as prior to admission.  Pain control with the use of the Lidoderm patch as needed as well as oxycodone/Zanaflex.  Due to patient decrease in functional mobility as well as history of CVA left side weakness, pt was recommended for CIR. Pt is to admit to CIR on 10/20/2020.    Patient's medical record from [facility] has been reviewed by the rehabilitation admission coordinator and physician.   Past Medical History      Past Medical History:   Diagnosis Date  . Atrial fibrillation ([place])    . Chronic back pain      "mid back down into lower back" (08/25/2014)  . Frequent falls    . GERD (gastroesophageal reflux disease)    . Hypertriglyceridemia    . OSA on CPAP    . Osteoarthritis      "knees, hands, back" (08/25/2014)  . Pneumonia ~ 2000 X 1  . Subdural hematoma ([place]) july 2015    S/P fall while on Coumadin  . T12 compression fracture ([place]) 2012  . Type II diabetes mellitus ([facility])        Family History   family history is not on file.   Prior Rehab/Hospitalizations Has the patient had prior rehab or hospitalizations prior to admission? Yes -reports been to CIR prior about 4 years ago   Has the patient had major surgery during 100 days prior to admission? No               Current Medications   Current Facility-Administered Medications:  .  0.9 %  sodium chloride infusion, , Intravenous, PRN, Aline August, MD, Stopped at 10/19/20 1135 .  acetaminophen (TYLENOL) tablet 650 mg, 650 mg, Oral, Q6H PRN, Lavina Hamman, MD, 650 mg at 10/19/20 0433 .  alum & mag hydroxide-simeth (MAALOX/MYLANTA) 200-200-20 MG/5ML suspension 15 mL, 15 mL, Oral, Q4H PRN, Alekh, Kshitiz, MD .  apixaban (ELIQUIS) tablet 5 mg, 5 mg, Oral, BID, Adrian Saran, RPH, 5 mg at 10/20/20 1054 .  bisacodyl (DULCOLAX) suppository 10 mg, 10 mg, Rectal, Daily PRN, Starla Link, Kshitiz, MD .  butalbital-acetaminophen-caffeine (FIORICET) 50-325-40 MG per tablet 1 tablet, 1 tablet, Oral, Q12H PRN, Lavina Hamman, MD .  Chlorhexidine Gluconate Cloth 2 % PADS 6 each, 6 each, Topical, Daily, Aline August, MD, 6 each at 10/19/20 1002 .  fluticasone (FLONASE) 50 MCG/ACT nasal spray 1 spray, 1 spray, Each Nare, Daily PRN, Lavina Hamman, MD .  gabapentin (NEURONTIN) 250 MG/5ML solution 100 mg, 100 mg, Oral, QHS PRN, Lavina Hamman, MD .  HYDROmorphone (DILAUDID) injection 0.5 mg, 0.5 mg, Intravenous, Q2H PRN, Lavina Hamman, MD, 0.5 mg at 10/17/20 2108 .  insulin  aspart (novoLOG) injection 0-5 Units, 0-5 Units, Subcutaneous, QHS, Patel, Josetta Huddle, MD .  insulin aspart (novoLOG) injection 0-9 Units, 0-9 Units, Subcutaneous, TID WC, Lavina Hamman, MD, 1 Units at 10/20/20 0900 .  levalbuterol (XOPENEX HFA) inhaler 1 puff, 1 puff, Inhalation, Q4H PRN, Lavina Hamman, MD .  lidocaine (LIDODERM) 5 % 2 patch, 2 patch, Transdermal, Daily PRN, Lavina Hamman, MD, 2 patch at 10/18/20 0024 .  MEDLINE mouth rinse, 15 mL, Mouth Rinse,  BID, Aline August, MD, 15 mL at 10/20/20 1055 .  metoprolol tartrate (LOPRESSOR) tablet 12.5 mg, 12.5 mg, Oral, BID, Alekh, Kshitiz, MD, 12.5 mg at 10/20/20 1054 .  mirabegron ER (MYRBETRIQ) tablet 50 mg, 50 mg, Oral, QHS, Lavina Hamman, MD, 50 mg at 10/19/20 2243 .  morphine 2 MG/ML injection 2 mg, 2 mg, Intravenous, Q2H PRN, Starla Link, Kshitiz, MD, 2 mg at 10/19/20 0743 .  mupirocin ointment (BACTROBAN) 2 % 1 application, 1 application, Nasal, BID, Alekh, Kshitiz, MD .  nitroGLYCERIN (NITROSTAT) SL tablet 0.4 mg, 0.4 mg, Sublingual, Q5 min PRN, Alekh, Kshitiz, MD .  ondansetron (ZOFRAN) injection 4 mg, 4 mg, Intravenous, Q6H PRN, Starla Link, Kshitiz, MD, 4 mg at 10/18/20 1022 .  oxyCODONE-acetaminophen (PERCOCET/ROXICET) 5-325 MG per tablet 1 tablet, 1 tablet, Oral, Q4H PRN, Lavina Hamman, MD, 1 tablet at 10/20/20 1053 .  pantoprazole (PROTONIX) EC tablet 40 mg, 40 mg, Oral, Daily, Alekh, Kshitiz, MD, 40 mg at 10/20/20 1100 .  polyethylene glycol (MIRALAX / GLYCOLAX) packet 17 g, 17 g, Oral, Daily PRN, Lavina Hamman, MD .  pravastatin (PRAVACHOL) tablet 20 mg, 20 mg, Oral, QHS, Lavina Hamman, MD, 20 mg at 10/19/20 2244 .  senna-docusate (Senokot-S) tablet 1 tablet, 1 tablet, Oral, BID, Starla Link, Kshitiz, MD, 1 tablet at 10/20/20 1100 .  tiZANidine (ZANAFLEX) tablet 2 mg, 2 mg, Oral, Q8H PRN, Lavina Hamman, MD, 2 mg at 10/19/20 0551 .  zolpidem (AMBIEN) tablet 5 mg, 5 mg, Oral, QHS PRN, Lavina Hamman, MD   Patients Current Diet:      Diet Order                      Diet regular Room service appropriate? Yes; Fluid consistency: Thin  Diet effective now              Diet general                      Precautions / Restrictions Precautions Precautions: Fall Restrictions Weight Bearing Restrictions: Yes RLE Weight Bearing: Weight bearing as tolerated Other Position/Activity Restrictions: WBAT    Has the patient had 2 or more falls or a fall with injury in the past year? Yes   Prior Activity Level Household: been less active since [place]; does not drive or work.   Prior Functional Level Self Care: Did the patient need help bathing, dressing, using the toilet or eating? Independent   Indoor Mobility: Did the patient need assistance with walking from room to room (with or without device)? Independent   Stairs: Did the patient need assistance with internal or external stairs (with or without device)? Needed some help   Functional Cognition: Did the patient need help planning regular tasks such as shopping or remembering to take medications? Needed some help   Home Assistive Devices / [place] Devices/Equipment: [place] Rile (specify type) (Front wheel walker) Home Equipment: Walker - 2 wheels,Walker - 4 wheels,Bedside commode,Tub bench   Prior Device Use: Indicate devices/aids used by the patient prior to current illness, exacerbation or injury? Rollator for outside; RW for inside; and 4pt cane at times   Current Functional Level Cognition   Overall Cognitive Status: Difficult to assess Difficult to assess due to: Non-English speaking Orientation Level: Oriented to person,Oriented to place,Disoriented to time,Disoriented to situation General Comments: appears [facility]    Extremity Assessment (includes Sensation/Coordination)   Upper Extremity Assessment: Defer to OT evaluation  Lower Extremity Assessment: LLE  deficits/detail LLE Deficits / Details: hip flex at least 2/5, knee ext at least 3/5, DF  at least 2/5 LLE Sensation: decreased proprioception LLE Coordination: decreased fine motor,decreased gross motor     ADLs   Overall ADL's : Needs assistance/impaired Eating/Feeding: Set up,Sitting Grooming: Set up,Sitting Upper Body Bathing: Set up,Sitting Lower Body Bathing: Maximal assistance,Sit to/from stand Upper Body Dressing : Set up,Sitting Lower Body Dressing: Total assistance,Sit to/from stand Toilet Transfer: Moderate assistance,BSC,Stand-pivot,RW Toilet Transfer Details (indicate cue type and reason): as demonstrated from recliner transfer [place] and Hygiene: Total assistance,Sit to/from stand Functional mobility during ADLs: Moderate assistance,Rolling walker     Mobility   Overal bed mobility: Needs Assistance Bed Mobility: Rolling,Sidelying to Sit Rolling: Mod assist Sidelying to sit: Max assist,HOB elevated General bed mobility comments: oob in recliner     Transfers   Overall transfer level: Needs assistance Equipment used: Rolling walker (2 wheeled) Transfers: Sit to/from Stand Sit to Stand: Min assist Stand pivot transfers: Mod assist General transfer comment: Assist to rise, stabilize, control descent. Multimodal cueing for safety, hand placement. Increased time.     Ambulation / Gait / Stairs / Wheelchair Mobility   Ambulation/Gait Ambulation/Gait assistance: Min assist,+2 safety/equipment Gait Distance (Feet): 7 Feet Assistive device: Rolling walker (2 wheeled) Gait Pattern/deviations: Step-to pattern,Trunk flexed,Antalgic General Gait Details: Assist to stabilize pt, advance L LE intermittently, manage RW, follow with recliner. Cues for safety, technique, sequence, proper use of RW. Distance limited by pain, weakness.     Posture / Balance Balance Overall balance assessment: Needs assistance,History of Falls Sitting-balance support: No upper extremity supported,Feet supported Sitting balance-Leahy Scale: Fair Standing  balance support: Bilateral upper extremity supported Standing balance-Leahy Scale: Poor     Special needs/care consideration Continuous Drip IV: 0.9% sodium chloride infusion, Oxygen: on RA, Skin : abrasion to right arm; ecchymosis to right leg, Diabetic management: yes and Special service needs: [place] speaking (daughter translates when present).     Previous Home Environment (from acute therapy documentation) Living Arrangements: Children Available Help at Discharge: Available PRN/intermittently,Family Type of Home: [place]: Two level,Able to live on main level with bedroom/bathroom Home Access: Stairs to enter Entrance Stairs-Number of Steps: 2 Bathroom Shower/Tub: Chiropodist: [place]: No   Discharge Living Setting Plans for Discharge Living Setting: House (lives with daughter) Type of Home at Discharge: House Discharge Home Layout: Able to live on main level with bedroom/bathroom Discharge Home Access: Stairs to enter Entrance Stairs-Rails: Left Entrance Stairs-Number of Steps: 2 Discharge Bathroom Shower/Tub: Tub/shower unit Discharge Bathroom Toilet: Handicapped height Discharge Bathroom Accessibility: Yes How Accessible: Accessible via walker Does the patient have any problems obtaining your medications?: No   Social/Family/Support Systems Patient Roles:  (lives with daughter) Sport and exercise psychologist Information: daughter: Makaylynn [phone] Anticipated Caregiver: Daughter + daughter in law Anticipated Caregiver's Contact Information: see above Ability/Limitations of Caregiver: supervision Caregiver Availability: Intermittent (close to 24/7-may be 2-3 hour stretches alone; family working to fill in the gap) Discharge Plan Discussed with Primary Caregiver: Yes Is Caregiver In Agreement with Plan?: Yes Does Caregiver/Family have Issues with Lodging/Transportation while Pt is in Rehab?: No   Goals Patient/Family Goal for Rehab: PT/OT:  Mod I/Supervision; SLP NA Expected length of stay: 7-10 days Cultural Considerations: speaks [place] (daughter translates when present) Pt/Family Agrees to Admission and willing to participate: Yes Program Orientation Provided & Reviewed with Pt/Caregiver Including Roles  & Responsibilities: Yes  Barriers to Discharge: Home environment access/layout,New oxygen  Barriers to Discharge  Comments: steps to enter; has new O2 needs.   Decrease burden of Care through IP rehab admission: Other NA   Possible need for SNF placement upon discharge: Not anticipated; pt has good social support at DC. Anticipate pt can reach supervision level through a CIR level program.    Patient Condition: I have reviewed medical records from [facility], spoken with RN, and patient and daughter. I met with patient at the bedside for inpatient rehabilitation assessment.  Patient will benefit from ongoing PT and OT, can actively participate in 3 hours of therapy a day 5 days of the week, and can make measurable gains during the admission.  Patient will also benefit from the coordinated team approach during an Inpatient Acute Rehabilitation admission.  The patient will receive intensive therapy as well as Rehabilitation physician, nursing, social worker, and care management interventions.  Due to safety, disease management, medication administration, pain management and patient education the patient requires 24 hour a day rehabilitation nursing.  The patient is currently Min A +2 with mobility and set up to total A for basic ADLs.  Discharge setting and therapy post discharge at home with home health is anticipated.  Patient has agreed to participate in the Acute Inpatient Rehabilitation Program and will admit 10/20/2020.   Preadmission Screen Completed By:  Raechel Ache, 10/20/2020 11:31 AM ______________________________________________________________________   Discussed status with Dr. Dagoberto Ligas on 10/20/2020 at 11:31AM  and received approval for admission today.   Admission Coordinator:  Raechel Ache, OT, time 11:31AM/Date 10/20/2020    Assessment/Plan: Diagnosis: 1. Does the need for close, 24 hr/day Medical supervision in concert with the patient's rehab needs make it unreasonable for this patient to be served in a less intensive setting? Yes 2. Co-Morbidities requiring supervision/potential complications: Afib, previous CVA, DM, pubic rami fx 3. Due to bowel management, safety, skin/wound care, disease management, medication administration, pain management and patient education, does the patient require 24 hr/day rehab nursing? Yes 4. Does the patient require coordinated care of a physician, rehab nurse, PT, OT, and SLP to address physical and functional deficits in the context of the above medical diagnosis(es)? Yes Addressing deficits in the following areas: balance, endurance, locomotion, strength, transferring, bathing, dressing, feeding, grooming and toileting 5. Can the patient actively participate in an intensive therapy program of at least 3 hrs of therapy 5 days a week? Yes 6. The potential for patient to make measurable gains while on inpatient rehab is good and fair 7. Anticipated functional outcomes upon discharge from inpatient rehab: modified independent and supervision PT, modified independent and supervision OT, n/a SLP 8. Estimated rehab length of stay to reach the above functional goals is: 7-10 days 9. Anticipated discharge destination: Home 10. Overall Rehab/Functional Prognosis: good and fair     MD Signature:            Revision History                                       Note Details  Jan Fireman, MD File Time 10/20/2020 12:06 PM  Author Type Physician Status Signed  Last Editor [name], MD Service Physical Medicine and [place] # [phone] Admit Date 10/20/2020

## 2020-10-20 NOTE — Progress Notes (Signed)
Physical Therapy Treatment Patient Details Name: [name] MRN: [id number] DOB: 28-Oct-1940 Today's Date: 10/20/2020    History of Present Illness 80 y.o. female with Past medical history of chronic A. 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  Patient presents s/p mechanical fall and found to have right sided pubic rami fracture    PT Comments    Pt assisted with ambulating short distance and recliner following for safety.  Pt and daughter pleased with progress.  Pt premedicated for session and still reports pain however able to progress better with mobility today.  Plan for CIR upon d/c.   Follow Up Recommendations  CIR     Equipment Recommendations  None recommended by PT    Recommendations for Other Services       Precautions / Restrictions Precautions Precautions: Fall Restrictions Other Position/Activity Restrictions: WBAT    Mobility  Bed Mobility               General bed mobility comments: pt up in recliner  Transfers Overall transfer level: Needs assistance Equipment used: Rolling walker (2 wheeled) Transfers: Sit to/from Stand Sit to Stand: Min assist         General transfer comment: Assist to rise, stabilize, control descent. Multimodal cueing for safety, hand placement. Increased time.  Ambulation/Gait Ambulation/Gait assistance: +2 safety/equipment;Mod assist Gait Distance (Feet): 12 Feet Assistive device: Rolling walker (2 wheeled) Gait Pattern/deviations: Step-to pattern;Trunk flexed;Antalgic     General Gait Details: assist for stability and advancing L LE; pt able to weight shift however difficulty with understanding weight bearing through UEs despite daughter explanation and also visual demonstration; recliner following for safety   Stairs             Wheelchair  Mobility    Modified Rankin (Stroke Patients Only)       Balance                                            Cognition Arousal/Alertness: Awake/alert Behavior During Therapy: WFL for tasks assessed/performed Overall Cognitive Status: Difficult to assess                                 General Comments: appears [facility]      Exercises      General Comments        Pertinent Vitals/Pain Pain Assessment: Faces Faces Pain Scale: Hurts even more Pain Location: R pelvis/thigh Pain Descriptors / Indicators: Aching;Spasm;Discomfort;Grimacing;Guarding Pain Intervention(s): Repositioned;Premedicated before session;Monitored during session    Home Living                      Prior Function            PT Goals (current goals can now be found in the care plan section) Progress towards PT goals: Progressing toward goals    Frequency    Min 3X/week      PT Plan Current plan remains appropriate    Co-evaluation              AM-PAC PT "6 Clicks" Mobility   Outcome Measure  Help needed turning from your back to your  side while in a flat bed without using bedrails?: A Lot Help needed moving from lying on your back to sitting on the side of a flat bed without using bedrails?: A Lot Help needed moving to and from a bed to a chair (including a wheelchair)?: A Little Help needed standing up from a chair using your arms (e.g., wheelchair or bedside chair)?: A Lot Help needed to walk in hospital room?: A Lot Help needed climbing 3-5 steps with a railing? : Total 6 Click Score: 12    End of Session Equipment Utilized During Treatment: Gait belt Activity Tolerance: Patient limited by fatigue;Patient limited by pain Patient left: in chair;with call bell/phone within reach;with family/visitor present Nurse Communication: Mobility status PT Visit Diagnosis: Muscle weakness (generalized) (M62.81);History of falling (Z91.81)     Time:  9136-8599 PT Time Calculation (min) (ACUTE ONLY): 17 min  Charges:  $Gait Training: 8-22 mins                     Thomasene Mohair PT, DPT Acute Rehabilitation Services Pager: [phone] Office: [phone]  Maida Sale E 10/20/2020, 1:47 PM

## 2020-10-20 NOTE — TOC Initial Note (Signed)
Transition of Care [facility]) - Initial/Assessment Note    Patient Details  Name: [name] MRN: [id number] Date of Birth: 1941-03-19  Transition of Care [facility]) CM/SW Contact:    Lanier Clam, RN Phone Number: 10/20/2020, 10:51 AM  Clinical Narrative:CIR following-await outcome.                   Expected Discharge Plan: IP Rehab Facility Barriers to Discharge: Continued Medical Work up   Patient Goals and CMS Choice        Expected Discharge Plan and Services Expected Discharge Plan: IP Rehab Facility                                              Prior Living Arrangements/Services                       Activities of Daily Living Home Assistive Devices/Equipment: Environmental consultant (specify type) (Front wheel walker) ADL Screening (condition at time of admission) Patient's cognitive ability adequate to safely complete daily activities?: Yes Is the patient deaf or have difficulty hearing?: No Does the patient have difficulty seeing, even when wearing glasses/contacts?: Yes (Reading  Glasses) Does the patient have difficulty concentrating, remembering, or making decisions?: Yes (Slightly since suffering a stroke (09/2016)) Patient able to express need for assistance with ADLs?: Yes Does the patient have difficulty dressing or bathing?: No Independently performs ADLs?: No Communication: Independent Dressing (OT): Independent Grooming: Independent Feeding: Independent Bathing: Independent Toileting: Independent In/Out Bed: Independent Walks in Home: Independent with device (comment) (Uses walker) Does the patient have difficulty walking or climbing stairs?: Yes Weakness of Legs: Left Weakness of Arms/Hands: Left  Permission Sought/Granted                  Emotional Assessment              Admission diagnosis:  Knee pain [M25.569] Pain [R52] Abdominal pain [R10.9] Closed fracture of multiple pubic rami, right, initial encounter (HCC)  [S32.591A] Closed fracture of ramus of right pubis, initial encounter (HCC) [S32.591A] Hip fracture (HCC) [S72.009A] Patient Active Problem List   Diagnosis Date Noted  . Hip fracture (HCC) 10/18/2020  . Closed fracture of multiple pubic rami, right, initial encounter (HCC) 10/17/2020  . Abdominal pain 10/17/2020  . Right knee pain 10/17/2020  . Fall at home, initial encounter 10/17/2020  . SIRS (systemic inflammatory response syndrome) (HCC) 02/13/2018  . Atrial fibrillation with RVR (HCC) 02/13/2018  . Hypotension 02/12/2018  . Primary osteoarthritis of left knee 02/09/2017  . SDH (subdural hematoma) (HCC)   . Urinary frequency   . Type 2 diabetes mellitus with peripheral neuropathy (HCC)   . Cough   . Gait disturbance, post-stroke 10/20/2016  . Hemiparesis affecting left side as late effect of stroke (HCC) 10/20/2016  . Essential hypertension 10/19/2016  . Hyperlipidemia LDL goal <70 10/19/2016  . 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  . Left hemiparesis (HCC)   . Atrial fibrillation (HCC)   . History of fall   . History of traumatic subdural hematoma   . Vascular headache   . Hypokalemia   . Leukocytosis   . Acute blood loss anemia   . Hypoalbuminemia due to protein-calorie malnutrition (HCC)   . Dysphagia, post-stroke   . Acute respiratory failure (HCC)   . Acute embolic stroke (HCC) - R putamen/caudate and R insular infarcts  s/p TICI3 revascularization w/ mechanical thrombectomy d/t AF not on [facility] 10/15/2016  . Pressure injury of skin 10/15/2016  . HCAP (healthcare-associated pneumonia) 11/09/2014  . Weakness 08/25/2014  . Acute bronchitis 08/25/2014  . UTI (lower urinary tract infection) 08/25/2014  . Fracture of lumbar spine (HCC) 08/25/2014  . Subdural hematoma, post-traumatic (HCC) 05/12/2014  . Chronic atrial fibrillation (HCC) 05/12/2014  . Diabetes mellitus (HCC) 05/12/2014  . OSA on CPAP 05/12/2014   PCP:  [name], MD Pharmacy:   [facility]  [phone] [place], [place] - [address] AT [facility] [address] [facility] [address] [place] [address] Phone: [phone] Fax: [phone]     Social Determinants of Health (SDOH) Interventions    Readmission Risk Interventions No flowsheet data found.

## 2020-10-20 NOTE — Progress Notes (Signed)
Patient ID: [name], female   DOB: 03-26-1941, 80 y.o.   MRN: [id number]  PROGRESS NOTE    [name]  ZTI:[id number] DOB: 1941/04/03 DOA: 10/17/2020 PCP: Haydee Salter, MD   Brief Narrative:  80 year old female with history of chronic A. fib, chronic back pain, GERD, recurrent falls, OSA on CPAP, hyperlipidemia, diabetes mellitus type 2, chronic anticoagulation, history of SDH after a fall on Coumadin, history of unspecified CVA 4 years ago, T12 compression fracture presented with a mechanical fall while she was trying to get out of the door.  She was found to have severe hip pain.  On presentation, she was found to have right pubic rami fracture.  Orthopedics was consulted by ED provider who recommended conservative measures with pain control and weightbearing as tolerated.  Assessment & Plan:   Closed fracture of right pubic rami following a mechanical fall -Patient presented with severe pain following a fall and CT pelvis showing no fracture of right pubic rami.  ED provider consulted orthopedics who recommended conservative measures with pain management and weightbearing as tolerated.  Continue current pain management.  Continue stool softeners/laxatives as needed.  Monitor mental status. -PT recommended CIR.  Patient and family agreeable for CIR.  CIR consulted.  Chest pain -Patient had chest pain in the morning of 10/19/2020.  EKG nonischemic.  Troponins x2 were flat -currently chest pain free. Outpatient followup with Cardiology -Patient was empirically started on IV Protonix twice a day yesterday.  Switch to oral Protonix once a day for now.  Leukocytosis -Probably reactive.  Improving  Chronic A. fib -Currently rate controlled.  Held Lopressor because of low blood pressure.  Continue Eliquis  Dyslipidemia -Continue Pravachol  Diabetes mellitus type II, uncontrolled with neuropathy -Metformin on hold.  Continue CBGs with SSI  History of unspecified CVA -Continue Eliquis.  Continue  statin  Hyperlipidemia -Continue statin  DVT prophylaxis: Resume Eliquis Code Status: Full Family Communication: Daughter at bedside Disposition Plan: Status is: Inpatient.  She is currently medically stable for discharge to CIR  Dispo: The patient is from: Home              Anticipated d/c is to: CIR              Anticipated d/c date is: 1 day              Patient currently is medically stable to d/c.   Consultants: Orthopedics   Procedures: None  Antimicrobials: None   Subjective: Patient seen and examined at bedside.  Daughter present at bedside who translates for the patient.  Denies current chest pain.  Feels better than yesterday.  Daughter is requesting for regular diet since the patient is not eating much of the current diet.  No worsening shortness of breath, fever or vomiting reported.    Objective: Vitals:   10/19/20 1833 10/19/20 2235 10/20/20 0148 10/20/20 0429  BP: 117/75 124/70 90/60 101/75  Pulse: 97 (!) 108 87 93  Resp: 20 18 18 20   Temp: 97.9 F (36.6 C) 98.2 F (36.8 C) 98.5 F (36.9 C) 99.4 F (37.4 C)  TempSrc: Oral Oral Oral Oral  SpO2: 98% 95% (!) 88% 91%  Weight:      Height:        Intake/Output Summary (Last 24 hours) at 10/20/2020 0757 Last data filed at 10/20/2020 0449 Gross per 24 hour  Intake 827.57 ml  Output 1800 ml  Net -972.43 ml   Filed Weights   10/17/20 2129 10/19/20 1831  Weight: 64.7 kg 68.1 kg    Examination:  General exam: No acute distress.  Chronically ill.  Poor historian.  Currently on room air.  Respiratory system: Bilateral decreased breath sounds at bases.  No wheezing cardiovascular system: S1-S2 heard, rate controlled Gastrointestinal system: Abdomen is nondistended, soft and nontender.  Normal bowel sounds are heard  extremities: No clubbing.  Trace lower extremity edema present   Data Reviewed: I have personally reviewed following labs and imaging studies  CBC: Recent Labs  Lab 10/17/20 1824  10/19/20 0314 10/20/20 0453  WBC 12.1* 12.7* 11.5*  NEUTROABS 9.7* 10.4* 8.4*  HGB 13.3 10.7* 10.5*  HCT 40.5 33.6* 32.6*  MCV 100.7* 103.7* 102.8*  PLT 301 217 225   Basic Metabolic Panel: Recent Labs  Lab 10/17/20 1824 10/19/20 0314 10/20/20 0453  NA 142 136 139  K 4.0 3.9 4.3  CL 103 103 107  CO2 27 22 24   GLUCOSE 138* 183* 154*  BUN 17 39* 19  CREATININE 0.75 1.09* 0.64  CALCIUM 9.7 8.4* 8.3*  MG  --  2.1 2.1   GFR: Estimated Creatinine Clearance: 46.6 mL/min (by C-G formula based on SCr of 0.64 mg/dL). Liver Function Tests: Recent Labs  Lab 10/17/20 1824 10/20/20 0453  AST 25 14*  ALT 19 12  ALKPHOS 39 32*  BILITOT 0.6 0.6  PROT 7.5 6.1*  ALBUMIN 4.7 3.5   No results for input(s): LIPASE, AMYLASE in the last 168 hours. No results for input(s): AMMONIA in the last 168 hours. Coagulation Profile: No results for input(s): INR, PROTIME in the last 168 hours. Cardiac Enzymes: No results for input(s): CKTOTAL, CKMB, CKMBINDEX, TROPONINI in the last 168 hours. BNP (last 3 results) No results for input(s): PROBNP in the last 8760 hours. HbA1C: Recent Labs    10/18/20 0300 10/18/20 0638  HGBA1C 6.5* 6.4*   CBG: Recent Labs  Lab 10/19/20 0844 10/19/20 1148 10/19/20 1632 10/19/20 2048 10/20/20 0735  GLUCAP 152* 172* 150* 194* 147*   Lipid Profile: No results for input(s): CHOL, HDL, LDLCALC, TRIG, CHOLHDL, LDLDIRECT in the last 72 hours. Thyroid Function Tests: No results for input(s): TSH, T4TOTAL, FREET4, T3FREE, THYROIDAB in the last 72 hours. Anemia Panel: No results for input(s): VITAMINB12, FOLATE, FERRITIN, TIBC, IRON, RETICCTPCT in the last 72 hours. Sepsis Labs: No results for input(s): PROCALCITON, LATICACIDVEN in the last 168 hours.  Recent Results (from the past 240 hour(s))  Resp Panel by RT-PCR (Flu A&B, Covid) Nasopharyngeal Swab     Status: None   Collection Time: 10/17/20  6:38 PM   Specimen: Nasopharyngeal Swab;  Nasopharyngeal(NP) swabs in vial transport medium  Result Value Ref Range Status   SARS Coronavirus 2 by RT PCR NEGATIVE NEGATIVE Final    Comment: (NOTE) SARS-CoV-2 target nucleic acids are NOT DETECTED.  The SARS-CoV-2 RNA is generally detectable in upper respiratory specimens during the acute phase of infection. The lowest concentration of SARS-CoV-2 viral copies this assay can detect is 138 copies/mL. A negative result does not preclude SARS-Cov-2 infection and should not be used as the sole basis for treatment or other patient management decisions. A negative result may occur with  improper specimen collection/handling, submission of specimen other than nasopharyngeal swab, presence of viral mutation(s) within the areas targeted by this assay, and inadequate number of viral copies(<138 copies/mL). A negative result must be combined with clinical observations, patient history, and epidemiological information. The expected result is Negative.  Fact Sheet for Patients:  12/15/20  Fact Sheet for  Healthcare Providers:  SeriousBroker.it  This test is no t yet approved or cleared by the [place] and  has been authorized for detection and/or diagnosis of SARS-CoV-2 by FDA under an Emergency Use Authorization (EUA). This EUA will remain  in effect (meaning this test can be used) for the duration of the COVID-19 declaration under Section 564(b)(1) of the Act, 21 U.S.C.section 360bbb-3(b)(1), unless the authorization is terminated  or revoked sooner.       Influenza A by PCR NEGATIVE NEGATIVE Final   Influenza B by PCR NEGATIVE NEGATIVE Final    Comment: (NOTE) The Xpert Xpress SARS-CoV-2/FLU/RSV plus assay is intended as an aid in the diagnosis of influenza from Nasopharyngeal swab specimens and should not be used as a sole basis for treatment. Nasal washings and aspirates are unacceptable for Xpert Xpress  SARS-CoV-2/FLU/RSV testing.  Fact Sheet for Patients: BloggerCourse.com  Fact Sheet for Healthcare Providers: SeriousBroker.it  This test is not yet approved or cleared by the [place] FDA and has been authorized for detection and/or diagnosis of SARS-CoV-2 by FDA under an Emergency Use Authorization (EUA). This EUA will remain in effect (meaning this test can be used) for the duration of the COVID-19 declaration under Section 564(b)(1) of the Act, 21 U.S.C. section 360bbb-3(b)(1), unless the authorization is terminated or revoked.  Performed at [facility], [address] [place], [place] [address]   MRSA PCR Screening     Status: None   Collection Time: 10/19/20  8:20 AM   Specimen: Nasal Mucosa; Nasopharyngeal  Result Value Ref Range Status   MRSA by PCR NEGATIVE NEGATIVE Final    Comment:        The GeneXpert MRSA Assay (FDA approved for NASAL specimens only), is one component of a comprehensive MRSA colonization surveillance program. It is not intended to diagnose MRSA infection nor to guide or monitor treatment for MRSA infections. Performed at [facility], [address], [place], [place] [address]          Radiology Studies: DG CHEST PORT 1 VIEW  Result Date: 10/19/2020 CLINICAL DATA:  Shortness of breath. EXAM: PORTABLE CHEST 1 VIEW COMPARISON:  02/12/2017 and CT chest 05/10/2019. FINDINGS: Trachea is midline. Heart is enlarged. Thoracic aorta is calcified. Lungs are low in volume with mild interstitial prominence and indistinctness. There may be left infrahilar airspace consolidation. Suspect a tiny left pleural effusion. IMPRESSION: 1. Low lung volumes with pulmonary vascular congestion and a tiny left pleural effusion. 2. Left infrahilar airspace opacification may be due to atelectasis. Difficult to exclude pneumonia. 3.  Aortic atherosclerosis (ICD10-I70.0).  Electronically Signed   By: Leanna Battles M.D.   On: 10/19/2020 07:57        Scheduled Meds: . apixaban  5 mg Oral BID  . Chlorhexidine Gluconate Cloth  6 each Topical Daily  . docusate sodium  100 mg Oral BID  . insulin aspart  0-5 Units Subcutaneous QHS  . insulin aspart  0-9 Units Subcutaneous TID WC  . mouth rinse  15 mL Mouth Rinse BID  . metoprolol tartrate  12.5 mg Oral BID  . mirabegron ER  50 mg Oral QHS  . mupirocin ointment  1 application Nasal BID  . pantoprazole (PROTONIX) IV  40 mg Intravenous Q12H  . pravastatin  20 mg Oral QHS   Continuous Infusions: . sodium chloride Stopped (10/19/20 1135)          Glade Lloyd, MD [facility] 10/20/2020, 7:57 AM

## 2020-10-20 NOTE — Progress Notes (Signed)
Patient arrived on unit via [facility] transport. Rehab schedule, medications and plan of care reviewed, patient states an understanding. Patient is Ax4 and has no complications noted at this time. Patient educated on use of call light. Rayburn Ma

## 2020-10-20 NOTE — Progress Notes (Addendum)
Inpatient Rehabilitation-Admissions Coordinator   Received medical clearance from Dr. Hanley Ben for admit to CIR today. Notified pt's daughter of bed offer and per pt's daughter, they have accepted. Reviewed insurance benefits letter and consent forms. All questions answered.   Will schedule transport via [facility]. Will update note once a pickup time has been confirmed.   Cheri Rous, OTR/L  Rehab Admissions Coordinator  [phone] 10/20/2020 11:25 AM   Per [facility], arrival time at [facility] should be around 1PM. Anticipate pick at [facility] to be around 12:15PM. RN notified.   Cheri Rous, OTR/L  Rehab Admissions Coordinator  [phone] 10/20/2020 11:50 AM

## 2020-10-20 NOTE — Progress Notes (Signed)
Inpatient Rehabilitation Medication Review by a Pharmacist  A complete drug regimen review was completed for this patient to identify any potential clinically significant medication issues.  Clinically significant medication issues were identified:  yes   Type of Medication Issue Identified Description of Issue Urgent (address now) Non-Urgent (address on AM team rounds) Plan Plan Accepted by Provider? (Yes / No / Pending AM Rounds)  Additional Drug Therapy Needed  DC summary indicates plan to resume Metformin and Vitamin D. Secure chat Metformin and Vitamin D ordered. Yes  Other  Bactroban nasal BID x 8 more doses re-ordered. Initial 2 doses not given/refused.  10/20/19 MRSA PCR negative Secure chat Bactroban nasal discontinued. Yes   Provider Method of Notification:   For non-urgent medication issues to be resolved on team rounds tomorrow morning a [facility] Secure Chat Handoff was sent to:   Harvel Ricks, PA-C  Pharmacist comments:    Off prn Cetrizine, Hydroxyzine, Maalox, Salonpas Arthritis patch (has Lidocaine patches prn), albuterol nebs (has Xopenex inhaler prn).  Time spent performing this drug regimen review (minutes):  20   Dennie Fetters, [place] 10/20/2020 2:31 PM

## 2020-10-20 NOTE — Progress Notes (Addendum)
Spoke with [name] at [facility], report given. Pt transported via [facility]. Daughter at bedside, updated and aware of plan. Ardyth Gal, RN 10/20/2020  Pt sent with IV access intact. [facility] and accepting facility aware and agreeable.  Ardyth Gal, RN 10/20/2020

## 2020-10-20 NOTE — H&P (Signed)
Physical Medicine and Rehabilitation Admission H&P    No chief complaint on file. : HPI: [name] is a 80 year old right-handed [place] speaking female with history of chronic atrial fibrillation currently maintained on Eliquis.  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  Per chart review patient lives with her daughter and son-in-law.  She also has other family in the area.  Two-level home bed and bath main level 2 steps to entry.  Independent with assistive device using a rolling walker as well is reportedly independent with ADLs.  Presented 10/17/2020 after mechanical fall while using her walker landing on her right hip.  No loss of consciousness.  Cranial CT scan negative for acute changes.  X-rays and imaging revealed moderately displaced and comminuted fracture involving the right inferior pubic ramus.  Nondisplaced fracture involving the right superior pubic ramus at its junction with the anterior acetabulum.  Orthopedic service follow-up Dr. Dion Saucier conservative care no surgical intervention weightbearing as tolerated.  Admission chemistries WBC 12,100, glucose 138, hemoglobin A1c 6.5, troponin negative.  She currently remains on Eliquis as prior to admission.  Pain control with the use of the Lidoderm patch as needed as well as oxycodone/Zanaflex.  Due to patient decrease in functional mobility as well as history of CVA left side weakness physical medicine rehab consult was contacted patient was admitted for a comprehensive rehab program.  Pt reports LBM was 3 days ago- wants to try senokot, before trying sorbitol- agreed, can do this, as long as has BM by tomorrow-  Voiding OK. Pain not well controlled- per nurse, pt's  Last pain medicine was 2.5 -3 hrs prior, and needed something "more".  This was all via translator on tele-interpretor then daughter.  Also, daughter wants pt to have purewick at night due to very frequent urinary urgency.    Review of Systems  Constitutional: Negative for chills and fever.  HENT: Negative for hearing loss.   Eyes: Negative for blurred vision and double vision.  Respiratory: Negative for cough and shortness of breath.   Cardiovascular: Positive for palpitations and leg swelling. Negative for chest pain.  Gastrointestinal: Positive for constipation. Negative for nausea and vomiting.       GERD  Genitourinary: Negative for dysuria, flank pain and hematuria.  Musculoskeletal: Positive for back pain and falls.  Skin: Negative for rash.  All other systems reviewed and are negative.  Past Medical History:  Diagnosis Date  . Atrial fibrillation (HCC)   . Chronic back pain    "mid back down into lower back" (08/25/2014)  . Frequent falls   . GERD (gastroesophageal reflux disease)   . Hypertriglyceridemia   . OSA on CPAP   . Osteoarthritis    "knees, hands, back" (08/25/2014)  . Pneumonia ~ 2000 X 1  . Subdural hematoma (HCC) july 2015   S/P fall while on Coumadin  . T12 compression fracture (HCC) 2012  . Type II diabetes mellitus (HCC)    Past Surgical History:  Procedure Laterality Date  . APPENDECTOMY  2012  . CATARACT EXTRACTION W/ INTRAOCULAR LENS  IMPLANT, BILATERAL Bilateral 2000's  . IR GENERIC HISTORICAL  10/15/2016   IR PERCUTANEOUS ART THROMBECTOMY/INFUSION INTRACRANIAL INC DIAG ANGIO 10/15/2016 [name], MD [facility]  . IR GENERIC HISTORICAL  12/13/2016  IR RADIOLOGIST EVAL & MGMT 12/13/2016 [facility]  . RADIOLOGY WITH ANESTHESIA N/A 10/15/2016   Procedure: RADIOLOGY WITH ANESTHESIA;  Surgeon: [name], MD;  Location: [facility] OR;  Service: Radiology;  Laterality: N/A;  . TOTAL ABDOMINAL HYSTERECTOMY  1990   Family History  Problem  Relation Age of Onset  . Diabetes Mellitus II Neg Hx   . Hypertension Neg Hx    Social History:  reports that she is a non-smoker but has been exposed to tobacco smoke. She has never used smokeless tobacco. She reports that she does not drink alcohol and does not use drugs. Allergies:  Allergies  Allergen Reactions  . Lactose Intolerance (Gi) Diarrhea  . Banana Swelling    Hands and feet  . Peanut-Containing Drug Products Itching    Throat itches, no swelling  . Chocolate Itching   Medications Prior to Admission  Medication Sig Dispense Refill  . albuterol (PROVENTIL) (2.5 MG/3ML) 0.083% nebulizer solution Take 2.5 mg every 6 (six) hours as needed by nebulization for wheezing or shortness of breath.    [name] alum & mag hydroxide-simeth (MAALOX/MYLANTA) 200-200-20 MG/5ML suspension Take 15 mLs by mouth every 4 (four) hours as needed for indigestion or heartburn (chest pain not relieved by NTG/morphine).    [name] apixaban (ELIQUIS) 5 MG TABS tablet Take 5 mg 2 (two) times daily by mouth.    . cetirizine (ZYRTEC) 10 MG tablet Take 10 mg daily as needed by mouth (seasonal allergies).    . Cholecalciferol (VITAMIN D) 50 MCG (2000 UT) tablet Take 2,000 Units by mouth daily.    . fluticasone (FLONASE) 50 MCG/ACT nasal spray Place 1 spray daily as needed into both nostrils (seasonal allergies).     . gabapentin (NEURONTIN) 250 MG/5ML solution Take 100 mg by mouth at bedtime as needed (neuropathy).    . hydrOXYzine (ATARAX/VISTARIL) 10 MG tablet Take 10 mg by mouth every 8 (eight) hours as needed for itching.    . Lidocaine 4 % PTCH Apply 2 patches topically daily as needed (pain).    . Liniments (SALONPAS ARTHRITIS PAIN RELIEF EX) Apply 2 patches topically daily as needed (pain).    . metFORMIN (GLUCOPHAGE) 500 MG tablet Take 500 mg by mouth 2 (two) times daily.    [name] oxyCODONE-acetaminophen (PERCOCET/ROXICET) 5-325 MG tablet Take 1 tablet by mouth every 4 (four) hours as needed for moderate pain.    Melene Muller ON 10/21/2020] pantoprazole (PROTONIX) 40 MG tablet Take 1 tablet (40 mg total) by mouth daily.    . polyethylene glycol (MIRALAX / GLYCOLAX) 17 g packet Take 17 g by mouth daily as needed for mild constipation.    . pravastatin (PRAVACHOL) 20 MG tablet Take 20 mg by mouth at bedtime.    . senna-docusate (SENOKOT-S) 8.6-50 MG tablet Take 2 tablets 2 (two) times daily as needed by mouth (while taking narcotic pain medication).    [name] tiZANidine (ZANAFLEX) 2 MG tablet Take 1 tablet (2 mg total) by mouth every 8 (eight) hours as needed for muscle spasms.      Drug Regimen Review Drug regimen was reviewed and remains appropriate with no significant issues identified  Home: Home Living Family/patient expects to be discharged to:: Private residence Living Arrangements: Children   Functional History:    Functional Status:  Mobility:          ADL:    Cognition: Cognition Orientation Level: Oriented X4    Physical Exam: Blood pressure (!) 108/57, pulse 90, temperature (!) 97.5 F (  36.4 C), temperature source Oral, resp. rate 15, weight 64.6 kg, SpO2 97 %. Physical Exam Vitals and nursing note reviewed. Exam conducted with a chaperone present.  Constitutional:      Comments: Pt awake, alert, speaks [place], used tele-interpretor and daughter also helped, laying supine in bed, NAD  HENT:     Head: Normocephalic and atraumatic.     Comments: Smile equal    Right Ear: External ear normal.     Left Ear: External ear normal.     Nose: Nose normal. No congestion.     Mouth/Throat:     Mouth: Mucous membranes are dry.     Pharynx: Oropharynx is clear. No oropharyngeal exudate.  Eyes:     General:        Right eye: No discharge.        Left eye: No discharge.     Extraocular Movements: Extraocular movements intact.  Cardiovascular:     Heart sounds: Normal heart sounds.     Comments: Irregular rhythm, rate controlled Pulmonary:     Comments: CTA B/L- no W/R/R- good air  movement Abdominal:     Comments: Soft, slightly TTP, diffusely- no rebound; hypoactive BS  Musculoskeletal:     Cervical back: Normal range of motion. No rigidity.     Comments: UEs 5-/5 in RUE- hard to get pt to participate, but appears 5-/5 LUE- 4/5 in LUE in same muscles tested RLE- HF 1/5? Pain; KE 2-/5, pain?, DF and PF at least 4/5, but limited by pain as well LLE- HF 2/5? Pain and weak; KE 3-/5, DF and PF 3-/5  Skin:    General: Skin is warm and dry.     Comments: No skin breakdown- has some bruising on hips, buttocks and thighs R>L  Neurological:     Comments: Patient is alert in no acute distress.  Makes eye contact with examiner and follows simple commands however there is a language barrier and daughter assisting with interpreting. Pt has some mild confusion Lewanda Rife- thought daughter, not nurse giving her meds- intact to light touch in all 4 extremities     Results for orders placed or performed during the hospital encounter of 10/20/20 (from the past 48 hour(s))  Glucose, capillary     Status: Abnormal   Collection Time: 10/20/20  5:40 PM  Result Value Ref Range   Glucose-Capillary 136 (H) 70 - 99 mg/dL    Comment: Glucose reference range applies only to samples taken after fasting for at least 8 hours.   Comment 1 Notify RN    DG CHEST PORT 1 VIEW  Result Date: 10/19/2020 CLINICAL DATA:  Shortness of breath. EXAM: PORTABLE CHEST 1 VIEW COMPARISON:  02/12/2017 and CT chest 05/10/2019. FINDINGS: Trachea is midline. Heart is enlarged. Thoracic aorta is calcified. Lungs are low in volume with mild interstitial prominence and indistinctness. There may be left infrahilar airspace consolidation. Suspect a tiny left pleural effusion. IMPRESSION: 1. Low lung volumes with pulmonary vascular congestion and a tiny left pleural effusion. 2. Left infrahilar airspace opacification may be due to atelectasis. Difficult to exclude pneumonia. 3.  Aortic atherosclerosis (ICD10-I70.0).  Electronically Signed   By: Lorin Picket M.D.   On: 10/19/2020 07:57       Medical Problem List and Plan: 1.  Decreased functional mobility secondary to right superior and inferior pubic rami fracture nondisplaced after a fall.  Weightbearing as tolerated.  Follow-up Dr. Mardelle Matte  -patient may  shower  -ELOS/Goals: 10-14 days- supervision to [facility]  2.  Antithrombotics: -DVT/anticoagulation: Chronic Eliquis  -antiplatelet therapy: N/A 3. Pain Management: Neurontin 100 mg nightly as needed, Lidoderm patch as directed oxycodone and Zanaflex as needed- will add tramadol 50 mg q6 hours prn since pt says Percocet wears off too fast.  4. Mood: Provide emotional support  -antipsychotic agents: N/A 5. Neuropsych: This patient is? capable of making decisions on her own behalf. 6. Skin/Wound Care: Routine skin checks 7. Fluids/Electrolytes/Nutrition: Routine in and outs with follow-up chemistries 8.  Chronic atrial fibrillation.  Continue Eliquis.  Cardiac rate controlled.  Lopressor 12.5 mg twice daily 9.  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  Received CIR 10/19/2016 to 11/01/2016.  Patient used a rolling walker prior to admission. 10.  Diabetes mellitus.  Hemoglobin A1c 6.4.  Metformin resumed 500 mg twice daily 11.  Hyperlipidemia.  Pravachol 12.  GERD.  Protonix 13.  Constipation.  Colace 100 mg twice daily, MiraLAX as needed- also senokot 1 tab BID- if doesn't work, will do sorbitol tomorrow 14. Urinary frequency- will give purewick at night. Mcarthur Rossetti Anguilli, PA-C 10/20/2020   .I have personally performed a face to face diagnostic evaluation of this patient and formulated the key components of the plan.  Additionally, I have personally reviewed laboratory data, imaging studies, as well as relevant notes and concur with the physician assistant's documentation above.   The patient's status has  not changed from the original H&P.  Any changes in documentation from the acute care chart have been noted above.     Genice Rouge, MD 10/20/2020

## 2020-10-20 NOTE — H&P (Incomplete)
Physical Medicine and Rehabilitation Admission H&P    Chief Complaint  Patient presents with  . Fall  : HPI: [name] is a 80 year old right-handed [place] speaking female with history of chronic atrial fibrillation currently maintained on Eliquis.  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  Per chart review patient lives with her daughter and son-in-law.  She also has other family in the area.  Two-level home bed and bath main level 2 steps to entry.  Independent with assistive device using a rolling walker as well is reportedly independent with ADLs.  Presented 10/17/2020 after mechanical fall while using her walker landing on her right hip.  No loss of consciousness.  Cranial CT scan negative for acute changes.  X-rays and imaging revealed moderately displaced and comminuted fracture involving the right inferior pubic ramus.  Nondisplaced fracture involving the right superior pubic ramus at its junction with the anterior acetabulum.  Orthopedic service follow-up Dr. Dion SaucierLandau conservative care no surgical intervention weightbearing as tolerated.  Admission chemistries WBC 12,100, glucose 138, hemoglobin A1c 6.5, troponin negative.  She currently remains on Eliquis as prior to admission.  Pain control with the use of the Lidoderm patch as needed as well as oxycodone/Zanaflex.  Due to patient decrease in functional mobility as well as history of CVA left side weakness physical medicine rehab consult was contacted patient was admitted for a comprehensive rehab program  Review of Systems  Constitutional: Negative for chills and fever.  HENT: Negative for hearing loss.   Eyes: Negative for blurred vision and double vision.   Respiratory: Negative for cough and shortness of breath.   Cardiovascular: Positive for palpitations and leg swelling. Negative for chest pain.  Gastrointestinal: Positive for constipation. Negative for nausea and vomiting.       GERD  Genitourinary: Negative for dysuria, flank pain and hematuria.  Musculoskeletal: Positive for back pain and falls.  Skin: Negative for rash.  All other systems reviewed and are negative.  Past Medical History:  Diagnosis Date  . Atrial fibrillation (HCC)   . Chronic back pain    "mid back down into lower back" (08/25/2014)  . Frequent falls   . GERD (gastroesophageal reflux disease)   . Hypertriglyceridemia   . OSA on CPAP   . Osteoarthritis    "knees, hands, back" (08/25/2014)  . Pneumonia ~ 2000 X 1  . Subdural hematoma (HCC) july 2015   S/P fall while on Coumadin  . T12 compression fracture (HCC) 2012  . Type II diabetes mellitus (HCC)    Past Surgical History:  Procedure Laterality Date  . APPENDECTOMY  2012  . CATARACT EXTRACTION W/ INTRAOCULAR LENS  IMPLANT, BILATERAL Bilateral 2000's  . IR GENERIC HISTORICAL  10/15/2016   IR PERCUTANEOUS ART THROMBECTOMY/INFUSION INTRACRANIAL INC DIAG ANGIO 10/15/2016 Julieanne CottonSanjeev Deveshwar, MD [facility]  . IR GENERIC HISTORICAL  12/13/2016   IR RADIOLOGIST EVAL & MGMT 12/13/2016 [facility]  . RADIOLOGY WITH ANESTHESIA N/A 10/15/2016   Procedure: RADIOLOGY WITH ANESTHESIA;  Surgeon: Julieanne CottonSanjeev Deveshwar, MD;  Location: [facility] OR;  Service: Radiology;  Laterality: N/A;  . TOTAL ABDOMINAL HYSTERECTOMY  1990   Family History  Problem Relation Age of Onset  . Diabetes Mellitus II Neg Hx   . Hypertension Neg Hx  Social History:  reports that she is a non-smoker but has been exposed to tobacco smoke. She has never used smokeless tobacco. She reports that she does not drink alcohol and does not use drugs. Allergies:  Allergies  Allergen Reactions  . Lactose Intolerance (Gi) Diarrhea  . Banana Swelling     Hands and feet  . Peanut-Containing Drug Products Itching    Throat itches, no swelling  . Chocolate Itching   Medications Prior to Admission  Medication Sig Dispense Refill  . apixaban (ELIQUIS) 5 MG TABS tablet Take 5 mg 2 (two) times daily by mouth.    . chlorpheniramine-HYDROcodone (TUSSIONEX) 10-8 MG/5ML SUER Take 5 mLs by mouth every 12 (twelve) hours as needed for cough.    . Cholecalciferol (VITAMIN D) 50 MCG (2000 UT) tablet Take 2,000 Units by mouth daily.    [name] gabapentin (NEURONTIN) 250 MG/5ML solution Take 100 mg by mouth at bedtime as needed (neuropathy).    [name] HYDROcodone-acetaminophen (NORCO/VICODIN) 5-325 MG tablet Take 1 tablet by mouth every 12 (twelve) hours as needed for moderate pain.    . hydrOXYzine (ATARAX/VISTARIL) 10 MG tablet Take 10 mg by mouth every 8 (eight) hours as needed for itching.    . Lidocaine 4 % PTCH Apply 2 patches topically daily as needed (pain).    . Liniments (SALONPAS ARTHRITIS PAIN RELIEF EX) Apply 2 patches topically daily as needed (pain).    . metFORMIN (GLUCOPHAGE) 500 MG tablet Take 500 mg by mouth 2 (two) times daily.    . metoprolol tartrate (LOPRESSOR) 25 MG tablet Take 0.5 tablets (12.5 mg total) by mouth 2 (two) times daily. 60 tablet 0  . pravastatin (PRAVACHOL) 20 MG tablet Take 20 mg by mouth at bedtime.    [name] acetaminophen (TYLENOL) 500 MG tablet Take 1,000 mg every 12 (twelve) hours as needed by mouth for headache (pain).     [name] albuterol (PROVENTIL) (2.5 MG/3ML) 0.083% nebulizer solution Take 2.5 mg every 6 (six) hours as needed by nebulization for wheezing or shortness of breath.    . cetirizine (ZYRTEC) 10 MG tablet Take 10 mg daily as needed by mouth (seasonal allergies).    . fluticasone (FLONASE) 50 MCG/ACT nasal spray Place 1 spray daily as needed into both nostrils (seasonal allergies).     . senna-docusate (SENOKOT-S) 8.6-50 MG tablet Take 2 tablets 2 (two) times daily as needed by mouth (while taking narcotic pain medication).     [name] tiZANidine (ZANAFLEX) 4 MG tablet TAKE 1 TABLET(4 MG) BY MOUTH AT BEDTIME 90 tablet 0  . traMADol (ULTRAM) 50 MG tablet Take by mouth every 12 (twelve) hours as needed for moderate pain.      Drug Regimen Review Drug regimen was reviewed and remains appropriate with no significant issues identified  Home: Home Living Family/patient expects to be discharged to:: Private residence Living Arrangements: Children Available Help at Discharge: Available PRN/intermittently,Family Type of Home: House Home Access: Stairs to enter Secretary/administrator of Steps: 2 Home Layout: Two level,Able to live on main level with bedroom/bathroom Bathroom Shower/Tub: Engineer, manufacturing systems: Standard Home Equipment: Environmental consultant - 2 wheels,Walker - 4 wheels,Bedside commode,Tub bench   Functional History: Prior Function Level of Independence: Independent with assistive device(s),Needs assistance Gait / Transfers Assistance Needed: Uses RW predominantly, Rollator outside of home ADL's / Homemaking Assistance Needed: Independent with ADLs, tub bench for bathing  Functional Status:  Mobility: Bed Mobility Overal bed mobility: Needs Assistance Bed Mobility: Rolling,Sidelying to Sit Rolling: Mod assist Sidelying to  sit: Max assist,HOB elevated General bed mobility comments: oob in recliner Transfers Overall transfer level: Needs assistance Equipment used: Rolling walker (2 wheeled) Transfers: Sit to/from Stand Sit to Stand: Min assist Stand pivot transfers: Mod assist General transfer comment: Assist to rise, stabilize, control descent. Multimodal cueing for safety, hand placement. Increased time. Ambulation/Gait Ambulation/Gait assistance: Min assist,+2 safety/equipment Gait Distance (Feet): 7 Feet Assistive device: Rolling walker (2 wheeled) Gait Pattern/deviations: Step-to pattern,Trunk flexed,Antalgic General Gait Details: Assist to stabilize pt, advance L LE intermittently, manage RW,  follow with recliner. Cues for safety, technique, sequence, proper use of RW. Distance limited by pain, weakness.    ADL: ADL Overall ADL's : Needs assistance/impaired Eating/Feeding: Set up,Sitting Grooming: Set up,Sitting Upper Body Bathing: Set up,Sitting Lower Body Bathing: Maximal assistance,Sit to/from stand Upper Body Dressing : Set up,Sitting Lower Body Dressing: Total assistance,Sit to/from stand Toilet Transfer: Moderate assistance,BSC,Stand-pivot,RW Toilet Transfer Details (indicate cue type and reason): as demonstrated from recliner transfer Toileting- Clothing Manipulation and Hygiene: Total assistance,Sit to/from stand Functional mobility during ADLs: Moderate assistance,Rolling walker  Cognition: Cognition Overall Cognitive Status: Difficult to assess Orientation Level: Oriented to person,Oriented to place,Disoriented to time,Disoriented to situation Cognition Arousal/Alertness: Awake/alert Behavior During Therapy: WFL for tasks assessed/performed Overall Cognitive Status: Difficult to assess General Comments: appears WFL Difficult to assess due to: Non-English speaking  Physical Exam: Blood pressure 101/75, pulse 93, temperature 99.4 F (37.4 C), temperature source Oral, resp. rate 20, height 4\' 10"  (1.473 m), weight 68.1 kg, SpO2 91 %. Physical Exam Neurological:     Comments: Patient is alert in no acute distress.  Makes eye contact with examiner and follows simple commands however there is a language barrier and daughter assisting with interpreting.     Results for orders placed or performed during the hospital encounter of 10/17/20 (from the past 48 hour(s))  Hemoglobin A1c     Status: Abnormal   Collection Time: 10/18/20  6:38 AM  Result Value Ref Range   Hgb A1c MFr Bld 6.4 (H) 4.8 - 5.6 %    Comment: (NOTE) Pre diabetes:          5.7%-6.4%  Diabetes:              >6.4%  Glycemic control for   <7.0% adults with diabetes    Mean Plasma Glucose  136.98 mg/dL    Comment: Performed at [facility], [address], [place], [place]  Glucose, capillary     Status: Abnormal   Collection Time: 10/18/20  7:31 AM  Result Value Ref Range   Glucose-Capillary 146 (H) 70 - 99 mg/dL    Comment: Glucose reference range applies only to samples taken after fasting for at least 8 hours.  Glucose, capillary     Status: Abnormal   Collection Time: 10/18/20 11:21 AM  Result Value Ref Range   Glucose-Capillary 194 (H) 70 - 99 mg/dL    Comment: Glucose reference range applies only to samples taken after fasting for at least 8 hours.  Glucose, capillary     Status: Abnormal   Collection Time: 10/18/20  5:18 PM  Result Value Ref Range   Glucose-Capillary 149 (H) 70 - 99 mg/dL    Comment: Glucose reference range applies only to samples taken after fasting for at least 8 hours.  Glucose, capillary     Status: Abnormal   Collection Time: 10/18/20  9:28 PM  Result Value Ref Range   Glucose-Capillary 184 (H) 70 - 99 mg/dL  Comment: Glucose reference range applies only to samples taken after fasting for at least 8 hours.  CBC with Differential/Platelet     Status: Abnormal   Collection Time: 10/19/20  3:14 AM  Result Value Ref Range   WBC 12.7 (H) 4.0 - 10.5 K/uL   RBC 3.24 (L) 3.87 - 5.11 MIL/uL   Hemoglobin 10.7 (L) 12.0 - 15.0 g/dL   HCT 65.7 (L) 84.6 - 96.2 %   MCV 103.7 (H) 80.0 - 100.0 fL   MCH 33.0 26.0 - 34.0 pg   MCHC 31.8 30.0 - 36.0 g/dL   RDW 95.2 84.1 - 32.4 %   Platelets 217 150 - 400 K/uL   nRBC 0.0 0.0 - 0.2 %   Neutrophils Relative % 81 %   Neutro Abs 10.4 (H) 1.7 - 7.7 K/uL   Lymphocytes Relative 9 %   Lymphs Abs 1.1 0.7 - 4.0 K/uL   Monocytes Relative 8 %   Monocytes Absolute 1.0 0.1 - 1.0 K/uL   Eosinophils Relative 1 %   Eosinophils Absolute 0.1 0.0 - 0.5 K/uL   Basophils Relative 0 %   Basophils Absolute 0.1 0.0 - 0.1 K/uL   Immature Granulocytes 1 %   Abs Immature Granulocytes 0.06 0.00 - 0.07 K/uL     Comment: Performed at [facility], [address] [place], [place] [address]  Basic metabolic panel     Status: Abnormal   Collection Time: 10/19/20  3:14 AM  Result Value Ref Range   Sodium 136 135 - 145 mmol/L   Potassium 3.9 3.5 - 5.1 mmol/L   Chloride 103 98 - 111 mmol/L   CO2 22 22 - 32 mmol/L   Glucose, Bld 183 (H) 70 - 99 mg/dL    Comment: Glucose reference range applies only to samples taken after fasting for at least 8 hours.   BUN 39 (H) 8 - 23 mg/dL   Creatinine, Ser 7.25 (H) 0.44 - 1.00 mg/dL   Calcium 8.4 (L) 8.9 - 10.3 mg/dL   GFR, Estimated 52 (L) >60 mL/min    Comment: (NOTE) Calculated using the CKD-EPI Creatinine Equation (2021)    Anion gap 11 5 - 15    Comment: Performed at [facility], [address] [place], [place] [address]  Magnesium     Status: None   Collection Time: 10/19/20  3:14 AM  Result Value Ref Range   Magnesium 2.1 1.7 - 2.4 mg/dL    Comment: Performed at [facility], [address], [place], [place] [address]  MRSA PCR Screening     Status: None   Collection Time: 10/19/20  8:20 AM   Specimen: Nasal Mucosa; Nasopharyngeal  Result Value Ref Range   MRSA by PCR NEGATIVE NEGATIVE    Comment:        The GeneXpert MRSA Assay (FDA approved for NASAL specimens only), is one component of a comprehensive MRSA colonization surveillance program. It is not intended to diagnose MRSA infection nor to guide or monitor treatment for MRSA infections. Performed at [facility], [address] [place], [place] [address]   Troponin I (High Sensitivity)     Status: None   Collection Time: 10/19/20  8:37 AM  Result Value Ref Range   Troponin I (High Sensitivity) 4 <18 ng/L    Comment: (NOTE) Elevated high sensitivity troponin I (hsTnI) values and significant  changes across serial measurements may suggest ACS but many other  chronic and acute conditions  are known to  elevate hsTnI results.  Refer to the "Links" section for chest pain algorithms and additional  guidance. Performed at [facility], [address] [place], [place] [address]   Glucose, capillary     Status: Abnormal   Collection Time: 10/19/20  8:44 AM  Result Value Ref Range   Glucose-Capillary 152 (H) 70 - 99 mg/dL    Comment: Glucose reference range applies only to samples taken after fasting for at least 8 hours.  Troponin I (High Sensitivity)     Status: None   Collection Time: 10/19/20  9:59 AM  Result Value Ref Range   Troponin I (High Sensitivity) 4 <18 ng/L    Comment: (NOTE) Elevated high sensitivity troponin I (hsTnI) values and significant  changes across serial measurements may suggest ACS but many other  chronic and acute conditions are known to elevate hsTnI results.  Refer to the "Links" section for chest pain algorithms and additional  guidance. Performed at [facility], [address], [place], [place] [address]   Glucose, capillary     Status: Abnormal   Collection Time: 10/19/20 11:48 AM  Result Value Ref Range   Glucose-Capillary 172 (H) 70 - 99 mg/dL    Comment: Glucose reference range applies only to samples taken after fasting for at least 8 hours.  Glucose, capillary     Status: Abnormal   Collection Time: 10/19/20  4:32 PM  Result Value Ref Range   Glucose-Capillary 150 (H) 70 - 99 mg/dL    Comment: Glucose reference range applies only to samples taken after fasting for at least 8 hours.  Glucose, capillary     Status: Abnormal   Collection Time: 10/19/20  8:48 PM  Result Value Ref Range   Glucose-Capillary 194 (H) 70 - 99 mg/dL    Comment: Glucose reference range applies only to samples taken after fasting for at least 8 hours.  CBC with Differential/Platelet     Status: Abnormal   Collection Time: 10/20/20  4:53 AM  Result Value Ref Range   WBC 11.5 (H) 4.0 - 10.5 K/uL   RBC 3.17 (L) 3.87 - 5.11 MIL/uL    Hemoglobin 10.5 (L) 12.0 - 15.0 g/dL   HCT 30.0 (L) 76.2 - 26.3 %   MCV 102.8 (H) 80.0 - 100.0 fL   MCH 33.1 26.0 - 34.0 pg   MCHC 32.2 30.0 - 36.0 g/dL   RDW 33.5 45.6 - 25.6 %   Platelets 225 150 - 400 K/uL   nRBC 0.0 0.0 - 0.2 %   Neutrophils Relative % 73 %   Neutro Abs 8.4 (H) 1.7 - 7.7 K/uL   Lymphocytes Relative 14 %   Lymphs Abs 1.6 0.7 - 4.0 K/uL   Monocytes Relative 10 %   Monocytes Absolute 1.2 (H) 0.1 - 1.0 K/uL   Eosinophils Relative 1 %   Eosinophils Absolute 0.1 0.0 - 0.5 K/uL   Basophils Relative 1 %   Basophils Absolute 0.1 0.0 - 0.1 K/uL   Immature Granulocytes 1 %   Abs Immature Granulocytes 0.08 (H) 0.00 - 0.07 K/uL    Comment: Performed at [facility], [address], [place], [place] [address]  Comprehensive metabolic panel     Status: Abnormal   Collection Time: 10/20/20  4:53 AM  Result Value Ref Range   Sodium 139 135 - 145 mmol/L   Potassium 4.3 3.5 - 5.1 mmol/L   Chloride 107 98 - 111 mmol/L  CO2 24 22 - 32 mmol/L   Glucose, Bld 154 (H) 70 - 99 mg/dL    Comment: Glucose reference range applies only to samples taken after fasting for at least 8 hours.   BUN 19 8 - 23 mg/dL   Creatinine, Ser 0.64 0.44 - 1.00 mg/dL   Calcium 8.3 (L) 8.9 - 10.3 mg/dL   Total Protein 6.1 (L) 6.5 - 8.1 g/dL   Albumin 3.5 3.5 - 5.0 g/dL   AST 14 (L) 15 - 41 U/L   ALT 12 0 - 44 U/L   Alkaline Phosphatase 32 (L) 38 - 126 U/L   Total Bilirubin 0.6 0.3 - 1.2 mg/dL   GFR, Estimated >60 >60 mL/min    Comment: (NOTE) Calculated using the CKD-EPI Creatinine Equation (2021)    Anion gap 8 5 - 15    Comment: Performed at [facility], [place] [address], [place], [place] [address]  Magnesium     Status: None   Collection Time: 10/20/20  4:53 AM  Result Value Ref Range   Magnesium 2.1 1.7 - 2.4 mg/dL    Comment: Performed at [facility], [place] [address] [place], [place] [address]   DG CHEST PORT 1 VIEW  Result Date:  10/19/2020 CLINICAL DATA:  Shortness of breath. EXAM: PORTABLE CHEST 1 VIEW COMPARISON:  02/12/2017 and CT chest 05/10/2019. FINDINGS: Trachea is midline. Heart is enlarged. Thoracic aorta is calcified. Lungs are low in volume with mild interstitial prominence and indistinctness. There may be left infrahilar airspace consolidation. Suspect a tiny left pleural effusion. IMPRESSION: 1. Low lung volumes with pulmonary vascular congestion and a tiny left pleural effusion. 2. Left infrahilar airspace opacification may be due to atelectasis. Difficult to exclude pneumonia. 3.  Aortic atherosclerosis (ICD10-I70.0). Electronically Signed   By: Lorin Picket M.D.   On: 10/19/2020 07:57       Medical Problem List and Plan: 1.  Decreased functional mobility secondary to right superior and inferior pubic rami fracture nondisplaced after a fall.  Weightbearing as tolerated.  Follow-up Dr. Mardelle Matte  -patient may *** shower  -ELOS/Goals: *** 2.  Antithrombotics: -DVT/anticoagulation: Chronic Eliquis  -antiplatelet therapy: N/A 3. Pain Management: Neurontin 100 mg nightly as needed, Lidoderm patch as directed oxycodone and Zanaflex as needed 4. Mood: Provide emotional support  -antipsychotic agents: N/A 5. Neuropsych: This patient is capable of making decisions on her own behalf. 6. Skin/Wound Care: Routine skin checks 7. Fluids/Electrolytes/Nutrition: Routine in and outs with follow-up chemistries 8.  Chronic atrial fibrillation.  Continue Eliquis.  Cardiac rate controlled.  Lopressor 12.5 mg twice daily 9.  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  Received CIR 10/19/2016 to 11/01/2016.  Patient used a rolling walker prior to admission. 10.  Diabetes mellitus.  Hemoglobin A1c 6.4.  Metformin resumed 500 mg twice daily 11.  Hyperlipidemia.  Pravachol 12.  GERD.  Protonix 13.  Constipation.  Colace 100 mg twice  daily, MiraLAX as needed ***  Cathlyn Parsons, PA-C 10/20/2020

## 2020-10-21 ENCOUNTER — Inpatient Hospital Stay (HOSPITAL_COMMUNITY): Payer: Medicare Other | Admitting: Occupational Therapy

## 2020-10-21 ENCOUNTER — Inpatient Hospital Stay (HOSPITAL_COMMUNITY): Payer: Medicare Other

## 2020-10-21 DIAGNOSIS — S32501D Unspecified fracture of right pubis, subsequent encounter for fracture with routine healing: Secondary | ICD-10-CM | POA: Diagnosis not present

## 2020-10-21 LAB — COMPREHENSIVE METABOLIC PANEL
ALT: 12 U/L (ref 0–44)
AST: 12 U/L — ABNORMAL LOW (ref 15–41)
Albumin: 2.9 g/dL — ABNORMAL LOW (ref 3.5–5.0)
Alkaline Phosphatase: 34 U/L — ABNORMAL LOW (ref 38–126)
Anion gap: 11 (ref 5–15)
BUN: 15 mg/dL (ref 8–23)
CO2: 25 mmol/L (ref 22–32)
Calcium: 8.2 mg/dL — ABNORMAL LOW (ref 8.9–10.3)
Chloride: 102 mmol/L (ref 98–111)
Creatinine, Ser: 0.6 mg/dL (ref 0.44–1.00)
GFR, Estimated: >60 mL/min (ref >60)
Glucose, Bld: 147 mg/dL — ABNORMAL HIGH (ref 70–99)
Potassium: 3.9 mmol/L (ref 3.5–5.1)
Sodium: 138 mmol/L (ref 135–145)
Total Bilirubin: 1 mg/dL (ref 0.3–1.2)
Total Protein: 5.4 g/dL — ABNORMAL LOW (ref 6.5–8.1)

## 2020-10-21 LAB — CBC WITH DIFFERENTIAL/PLATELET
Abs Immature Granulocytes: 0.07 10*3/uL (ref 0.00–0.07)
Basophils Absolute: 0.1 10*3/uL (ref 0.0–0.1)
Basophils Relative: 1 %
Eosinophils Absolute: 0.2 10*3/uL (ref 0.0–0.5)
Eosinophils Relative: 2 %
HCT: 31.3 % — ABNORMAL LOW (ref 36.0–46.0)
Hemoglobin: 10.1 g/dL — ABNORMAL LOW (ref 12.0–15.0)
Immature Granulocytes: 1 %
Lymphocytes Relative: 12 %
Lymphs Abs: 1 10*3/uL (ref 0.7–4.0)
MCH: 32.9 pg (ref 26.0–34.0)
MCHC: 32.3 g/dL (ref 30.0–36.0)
MCV: 102 fL — ABNORMAL HIGH (ref 80.0–100.0)
Monocytes Absolute: 1 10*3/uL (ref 0.1–1.0)
Monocytes Relative: 12 %
Neutro Abs: 6.2 10*3/uL (ref 1.7–7.7)
Neutrophils Relative %: 72 %
Platelets: 242 10*3/uL (ref 150–400)
RBC: 3.07 MIL/uL — ABNORMAL LOW (ref 3.87–5.11)
RDW: 12.4 % (ref 11.5–15.5)
WBC: 8.6 10*3/uL (ref 4.0–10.5)
nRBC: 0 % (ref 0.0–0.2)

## 2020-10-21 LAB — GLUCOSE, CAPILLARY
Glucose-Capillary: 99 mg/dL (ref 70–99)
Glucose-Capillary: 126 mg/dL — ABNORMAL HIGH (ref 70–99)

## 2020-10-21 MED ORDER — SORBITOL 70 % SOLN
30.0000 mL | Freq: Once | Status: AC
  Administered 2020-10-21: 30 mL via ORAL
  Filled 2020-10-21: qty 30

## 2020-10-21 MED ORDER — LIDOCAINE 5 % EX PTCH
2.0000 | MEDICATED_PATCH | CUTANEOUS | Status: DC
  Administered 2020-10-22 – 2020-11-09 (×18): 2 via TRANSDERMAL
  Filled 2020-10-21 (×19): qty 2

## 2020-10-21 NOTE — Progress Notes (Signed)
Occupational Therapy Session Note  Patient Details  Name: [name] MRN: [id number] Date of Birth: Feb 14, 1941  Today's Date: 10/21/2020 OT Individual Time: 0623-7628 OT Individual Time Calculation (min): 73 min    Short Term Goals: Week 1:  OT Short Term Goal 1 (Week 1): pt will perform sit to stand with Min A in prep for LB ADL OT Short Term Goal 2 (Week 1): Pt will transfer with Mod A to toilet with LRAD OT Short Term Goal 3 (Week 1): Pt will perform clothing management for toilet tasks with no more than Mod A OT Short Term Goal 4 (Week 1): Pt will perform LB bathing with AE PRN with Min A  Skilled Therapeutic Interventions/Progress Updates:  Pt greeted at time of session sitting up in recliner with daughter present, no pain in sitting but c/o pain in R hip/LE with standing later in session, no number provided. Pt needing to use the bathroom urgently, Stedy transfer with Min/Mod for sit to stand at [place] and transferred to toilet. (+) urine but no BM, uncomfortable d/t constipation. Assist to wipe buttocks in sitting and forward weight shift as pt felt she may have had small smear but did not. Max A for clothing management but pt able to stand at [facility] after using toilet to help with clothing, Mod A for stand pivot to wheelchair toward L side, physical assist needed to weight shift to R to lift L foot. Limited d/t pain, no number given but significant and reached out to RN for pain meds who was to provide at end of session. Donned TEDs dependent for time management. Transported to gym via wheelchair and sit to stand at [facility] with Min/Mod, note performs better with extended time instead of physical assist. Stood and performed card matching game for 4-5 minutes, Min cues needed at times for correct matching. Trialed rebounder seated, but pt with difficulty throwing ball with enough force to bounce back and activity ended. Transported back to room and stand pivot to bed with RW Mod A with difficulty weight  shifting. Sit to supine Mod A and left with nursing care. Note daughter present throughout session and provided interpretation.     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

## 2020-10-21 NOTE — Evaluation (Signed)
Physical Therapy Assessment and Plan  Patient Details  Name: [name] MRN: [id number] Date of Birth: 06-24-1941  PT Diagnosis: Abnormal posture, Abnormality of gait, Difficulty walking, Muscle weakness and Pain in joint Rehab Potential: Good ELOS: 2 weeks   Today's Date: 10/21/2020 PT Individual Time: 0830-0930 PT Individual Time Calculation (min): 60 min    Hospital Problem: Principal Problem:   Pelvic fracture ([place])   Past Medical History:  Past Medical History:  Diagnosis Date  . Atrial fibrillation ([place])   . Chronic back pain    "mid back down into lower back" (08/25/2014)  . Frequent falls   . GERD (gastroesophageal reflux disease)   . Hypertriglyceridemia   . OSA on CPAP   . Osteoarthritis    "knees, hands, back" (08/25/2014)  . Pneumonia ~ 2000 X 1  . Subdural hematoma ([place]) july 2015   S/P fall while on Coumadin  . T12 compression fracture ([place]) 2012  . Type II diabetes mellitus ([place])    Past Surgical History:  Past Surgical History:  Procedure Laterality Date  . APPENDECTOMY  2012  . CATARACT EXTRACTION W/ INTRAOCULAR LENS  IMPLANT, BILATERAL Bilateral 2000's  . IR GENERIC HISTORICAL  10/15/2016   IR PERCUTANEOUS ART THROMBECTOMY/INFUSION INTRACRANIAL INC DIAG ANGIO 10/15/2016 [name], MD [facility]  . IR GENERIC HISTORICAL  12/13/2016   IR RADIOLOGIST EVAL & MGMT 12/13/2016 [facility]  . RADIOLOGY WITH ANESTHESIA N/A 10/15/2016   Procedure: RADIOLOGY WITH ANESTHESIA;  Surgeon: [name], MD;  Location: [facility];  Service: Radiology;  Laterality: N/A;  . TOTAL ABDOMINAL HYSTERECTOMY  1990    Assessment & Plan Clinical Impression: [name] is a 80 year old right-handed [place] speaking female with history of chronic atrial fibrillation currently maintained on Eliquis.  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  Per chart review patient lives with her daughter and son-in-law.  She also has other family in the area.  Two-level home bed and bath main level 2 steps to entry.  Independent with assistive device using a rolling walker as well is reportedly independent with ADLs.  Presented 10/17/2020 after mechanical fall while using her walker landing on her right hip.  No loss of consciousness.  Cranial CT scan negative for acute changes.  X-rays and imaging revealed moderately displaced and comminuted fracture involving the right inferior pubic ramus.  Nondisplaced fracture involving the right superior pubic ramus at its junction with the anterior acetabulum.  Orthopedic service follow-up Dr. Mardelle Matte conservative care no surgical intervention weightbearing as tolerated.  Admission chemistries WBC 12,100, glucose 138, hemoglobin A1c 6.5, troponin negative.  She currently remains on Eliquis as prior to admission.  Pain control with the use of the Lidoderm patch as needed as well as oxycodone/Zanaflex.  Due to patient decrease in functional mobility as well as history of CVA left side weakness physical medicine rehab consult was contacted patient was admitted for a comprehensive rehab program.  Patient transferred to [facility] on 10/20/2020 .   Patient currently requires max with mobility secondary to muscle weakness, muscle joint tightness and Pain and motor apraxia and decreased coordination.  Prior to hospitalization, patient was modified independent  with mobility and lived with [place] in a House home.  Home access is 2Stairs to enter.  Patient will  benefit from skilled PT intervention to maximize safe functional mobility, minimize fall risk and decrease caregiver burden for planned discharge home with 24 hour supervision.  Anticipate patient will benefit from follow up [place] at  discharge.  PT - End of Session Activity Tolerance: Decreased this session;Tolerates 30+ min activity with multiple rests Endurance Deficit: Yes Endurance Deficit Description: limited by pain and generalized weakness PT Assessment Rehab Potential (ACUTE/IP ONLY): Good PT Patient demonstrates impairments in the following area(s): Balance;Pain;Endurance;Motor;Safety PT Transfers Functional Problem(s): Bed Mobility;Bed to Chair;Car PT Locomotion Functional Problem(s): Ambulation;Stairs PT Plan PT Intensity: Minimum of 1-2 x/day ,45 to 90 minutes PT Frequency: 5 out of 7 days PT Duration Estimated Length of Stay: 2 weeks PT Treatment/Interventions: Ambulation/gait training;DME/adaptive equipment instruction;Functional mobility training;Balance/vestibular training;Patient/family education;Therapeutic Activities;UE/LE Strength taining/ROM;Wheelchair propulsion/positioning;UE/LE Coordination activities;Therapeutic Exercise;Stair training PT Transfers Anticipated Outcome(s): S PT Locomotion Anticipated Outcome(s): S household ambulation w/ RW PT Recommendation Follow Up Recommendations: Home health PT;24 hour supervision/assistance Patient destination: Home Equipment Recommended: None recommended by PT   PT Evaluation Precautions/Restrictions Precautions Precautions: Fall Restrictions Weight Bearing Restrictions: No RLE Weight Bearing: Weight bearing as tolerated Pain Pain Assessment Pain Scale: 0-10 Pain Score: 7  Pain Type: Chronic pain Pain Location: Leg Pain Orientation: Right Pain Intervention(s): Medication (See eMAR) Home Living/Prior Functioning Home Living Available Help at Discharge: Family;Available 24 hours/day Type of Home: House Home Access: Stairs to enter [employer] of Steps: 2 Entrance Stairs-Rails: Left Home Layout: Two level;Able to live on main level with bedroom/bathroom Bathroom Shower/Tub: Chiropodist: Handicapped height   Lives With: Daughter;Family Prior Function Level of Independence: Requires assistive device for independence;Independent with basic ADLs Vision/Perception     Cognition Overall Cognitive Status: (P) History of cognitive impairments - at baseline Arousal/Alertness: (P) Awake/alert Orientation Level: Oriented X4 Memory: (P) Impaired Sensation Sensation Light Touch: Appears Intact Proprioception: Not tested Stereognosis: Not tested Additional Comments: occasional numbness in feet Coordination Gross Motor Movements are Fluid and Coordinated: No Fine Motor Movements are Fluid and Coordinated: No Coordination and Movement Description: limited on R by pain, on L by weakness from old CVA Motor  Motor Motor: Abnormal postural alignment and control;Motor apraxia Motor - Skilled Clinical Observations: difficulty sequencing at times for sit to stands and ADL, very broad base standing   Trunk/Postural Assessment  Cervical Assessment Cervical Assessment: Exceptions to [facility] Thoracic Assessment Thoracic Assessment: Exceptions to [facility] Lumbar Assessment Lumbar Assessment: Exceptions to [facility] Postural Control Postural Control: Deficits on evaluation  Balance Balance Balance Assessed: Yes Static Sitting Balance Static Sitting - Balance Support: Feet supported Static Sitting - Level of Assistance: 5: Stand by assistance Dynamic Sitting Balance Dynamic Sitting - Balance Support: Feet supported;Left upper extremity supported Dynamic Sitting - Level of Assistance: 4: Min assist Dynamic Sitting - Balance Activities: Lateral lean/weight shifting;Forward lean/weight shifting Static Standing Balance Static Standing - Balance Support: During functional activity;Bilateral upper extremity supported Static Standing - Level of Assistance: 4: Min assist Static Standing - Comment/# of Minutes: Able to balance while brief changed in standing with min A and bilateral UE support Dynamic Standing  Balance Dynamic Standing - Level of Assistance: 3: Mod assist Dynamic Standing - Comments: assist during gait activities. Extremity Assessment  RUE Assessment RUE Assessment: Within Functional Limits General Strength Comments: generalized weakness but functional LUE Assessment LUE Assessment: Exceptions to [facility] Active Range of Motion (AROM) Comments: limited from previous CVA, approx 100* shoulder flexion, WFL for elbow/wrist, decreased grip General Strength Comments: decreased AROM and strength from previous CVA  RLE Assessment RLE Assessment: Exceptions to [facility] Active Range of Motion (AROM) Comments: AAROM WFL, but painful in hip General Strength Comments: Hip flexion NT due to pain, knee extension 3-/5, ankle DF [facility]    Care Tool Care Tool Bed Mobility Roll left and right activity   Roll left and right assist level: Supervision/Verbal cueing    Sit to lying activity Sit to lying activity did not occur: Safety/medical concerns      Lying to sitting edge of bed activity   Lying to sitting edge of bed assist level: Maximal Assistance - Patient 25 - 49%     Care Tool Transfers Sit to stand transfer   Sit to stand assist level: Maximal Assistance - Patient 25 - 49%    Chair/bed transfer   Chair/bed transfer assist level: Dependent - Librarian, academic transfer activity did not occur: Safety/medical concerns        Care Tool Locomotion Ambulation   Assist level: Maximal Assistance - Patient 25 - 49% Assistive device: Walker-rolling Max distance: 5  Walk 10 feet activity Walk 10 feet activity did not occur: Safety/medical concerns       Walk 50 feet with 2 turns activity Walk 50 feet with 2 turns activity did not occur: Safety/medical concerns      Walk 150 feet activity Walk 150 feet activity did not occur: Safety/medical concerns      Walk 10 feet on uneven surfaces activity Walk 10 feet on uneven surfaces activity did not  occur: Safety/medical concerns      Stairs Stair activity did not occur: Safety/medical concerns        Walk up/down 1 step activity Walk up/down 1 step or curb (drop down) activity did not occur: Safety/medical concerns        Walk up/down 4 steps activity      Walk up/down 12 steps activity Walk up/down 12 steps activity did not occur: Safety/medical concerns      Pick up small objects from floor Pick up small object from the floor (from standing position) activity did not occur: Safety/medical concerns      Wheelchair Will patient use wheelchair at discharge?: No Type of Wheelchair: Manual   Wheelchair assist level: Minimal Assistance - Patient > 75% Max wheelchair distance: 40'  Wheel 50 feet with 2 turns activity Wheelchair 50 feet with 2 turns activity did not occur: Safety/medical concerns    Wheel 150 feet activity Wheelchair 150 feet activity did not occur: Safety/medical concerns      Refer to Care Plan for Long Term Goals  SHORT TERM GOAL WEEK 1 PT Short Term Goal 1 (Week 1): Patient to perform bed mobility with min to mod A. PT Short Term Goal 2 (Week 1): Patient to perform sit to stand with min to mod A. PT Short Term Goal 3 (Week 1): Patient to ambulate 11' with RW and min A. PT Short Term Goal 4 (Week 1): Patient to perform standing activity with 1 UE support and min A for improved balance.  Recommendations for other services: None   Skilled Therapeutic Intervention  Patient in supine with daughter in the room.  Obtained information on home set up and patient level of function from daughter who helped to translate throughout session.  Medical interpreter arrived during session as well.  Patient supine to sit with mod to max A to move R leg off bed and to  lift trunk.  Patient seated EOB for BP testing and noted supine and sitting BP both 978 systolic and pt with minimal c/o dizziness.  Obtained youth RW for pt and cushion for w/c.  Patient sit to stand max A  with mod cues for hand placement and for initiation.  Patient standing for removing pure wick and changing brief due to soiled with urine with daughter assisting.  Patient static standing balance with min A with bilateral UE support but both knees locked out and broad BOS with hips flexed.  Patient needed manual facilitation for stand pivot with R lateral weight shift and assist to move L foot, pt able to pivot on R foot to allow to sit in w/c brought up behind her.  Patient assisted to scoot back in w/c.  Assisted to hallway in w/c and pt propelled about 74' with mod cues veering to L due to L UE weakness.  Patient assisted to therapy gym.  Performed sit to stand to RW mod to max A.  Ambulated about 5-6' with RW and mod A max cues and initial demonstration for offloading R LE using walker and for step sequence.  PAtient able to improve R LE movement during ambulation.  Daughter brought w/c up behind pt to allow her to sit due to fatigue.  Assisted in w/c back to room.  Discussed using different method for nursing transfers using [place].  Nursing in the room and patient performed sit to stand and SPT to recliner from w/c with mod A sit to stand and total A for pivot to recliner using [place].  Nursing aware to use for toilet transfers.  Patient left in recliner with chair alarm, daughter and NT in the room.   Mobility Bed Mobility Bed Mobility: Supine to Sit;Sitting - Scoot to Edge of Bed Supine to Sit: Maximal Assistance - Patient - Patient 25-49% Transfers Transfers: Sit to Stand;Stand to Sit;Stand Pivot Transfers Sit to Stand: Maximal Assistance - Patient 25-49% Stand to Sit: Moderate Assistance - Patient 50-74% Stand Pivot Transfers: Maximal Assistance - Patient 25 - 49% Stand Pivot Transfer Details: Manual facilitation for weight shifting;Verbal cues for technique;Verbal cues for safe use of DME/AE Transfer (Assistive device): Rolling walker (youth size) Locomotion  Gait Ambulation: Yes Gait  Assistance: 2 Helpers Assistive device: Rolling walker Gait Assistance Details: Verbal cues for technique;Verbal cues for gait pattern;Verbal cues for safe use of DME/AE;Manual facilitation for weight shifting Gait Gait: Yes Gait Pattern: Impaired Gait Pattern: Step-to pattern;Decreased stride length;Decreased hip/knee flexion - left;Right circumduction;Shuffle;Wide base of support;Poor foot clearance - left Wheelchair Mobility Wheelchair Mobility: Yes Wheelchair Assistance: Minimal assistance - Patient >75% Wheelchair Propulsion: Both upper extremities Wheelchair Parts Management: Needs assistance Distance: 42'   Discharge Criteria: Patient will be discharged from PT if patient refuses treatment 3 consecutive times without medical reason, if treatment goals not met, if there is a change in medical status, if patient makes no progress towards goals or if patient is discharged from hospital.  The above assessment, treatment plan, treatment alternatives and goals were discussed and mutually agreed upon: by patient and by family  Jamison Oka, PT 10/21/2020, 9:32 AM

## 2020-10-21 NOTE — Progress Notes (Signed)
Inpatient Rehabilitation Center Individual Statement of Services  Patient Name:  [name]  Date:  10/21/2020  Welcome to the Inpatient Rehabilitation Center.  Our goal is to provide you with an individualized program based on your diagnosis and situation, designed to meet your specific needs.  With this comprehensive rehabilitation program, you will be expected to participate in at least 3 hours of rehabilitation therapies Monday-Friday, with modified therapy programming on the weekends.  Your rehabilitation program will include the following services:  Physical Therapy (PT), Occupational Therapy (OT), 24 hour per day rehabilitation nursing, Care Coordinator, Rehabilitation Medicine, Nutrition Services and Pharmacy Services  Weekly team conferences will be held on Wednesday to discuss your progress.  Your Inpatient Rehabilitation Care Coordinator will talk with you frequently to get your input and to update you on team discussions.  Team conferences with you and your family in attendance may also be held.  Expected length of stay: 14-16 days  Overall anticipated outcome: supervision-CGA-PT goals and supervision-min assist-OT goals  Depending on your progress and recovery, your program may change. Your Inpatient Rehabilitation Care Coordinator will coordinate services and will keep you informed of any changes. Your Inpatient Rehabilitation Care Coordinator's name and contact numbers are listed  below.  The following services may also be recommended but are not provided by the Inpatient Rehabilitation Center:    Home Health Rehabiltiation Services  Outpatient Rehabilitation Services    Arrangements will be made to provide these services after discharge if needed.  Arrangements include referral to agencies that provide these services.  Your insurance has been verified to be:  Medicare and Medicaid Your primary doctor is:  [name]  Pertinent information will be shared with your doctor and  your insurance company.  Inpatient Rehabilitation Care Coordinator:  Dossie Der, [place] [phone] or Luna [place]  Information discussed with and copy given to patient by: Lucy Chris, 10/21/2020, 10:05 AM

## 2020-10-21 NOTE — Evaluation (Signed)
Occupational Therapy Assessment and Plan  Patient Details  Name: [name] MRN: [id number] Date of Birth: 07-05-1941  OT Diagnosis: abnormal posture, acute pain, cognitive deficits, muscle weakness (generalized) and R pelvic pain Rehab Potential:   ELOS: 14-16 days   Today's Date: 10/21/2020 OT Individual Time: 8333-8329 OT Individual Time Calculation (min): 56 min     Hospital Problem: Principal Problem:   Pelvic fracture ([place])   Past Medical History:  Past Medical History:  Diagnosis Date  . Atrial fibrillation ([place])   . Chronic back pain    "mid back down into lower back" (08/25/2014)  . Frequent falls   . GERD (gastroesophageal reflux disease)   . Hypertriglyceridemia   . OSA on CPAP   . Osteoarthritis    "knees, hands, back" (08/25/2014)  . Pneumonia ~ 2000 X 1  . Subdural hematoma ([place]) july 2015   S/P fall while on Coumadin  . T12 compression fracture ([place]) 2012  . Type II diabetes mellitus ([place])    Past Surgical History:  Past Surgical History:  Procedure Laterality Date  . APPENDECTOMY  2012  . CATARACT EXTRACTION W/ INTRAOCULAR LENS  IMPLANT, BILATERAL Bilateral 2000's  . IR GENERIC HISTORICAL  10/15/2016   IR PERCUTANEOUS ART THROMBECTOMY/INFUSION INTRACRANIAL INC DIAG ANGIO 10/15/2016 [name], MD [facility]  . IR GENERIC HISTORICAL  12/13/2016   IR RADIOLOGIST EVAL & MGMT 12/13/2016 [facility]  . RADIOLOGY WITH ANESTHESIA N/A 10/15/2016   Procedure: RADIOLOGY WITH ANESTHESIA;  Surgeon: [name], MD;  Location: [place];  Service: Radiology;  Laterality: N/A;  . TOTAL ABDOMINAL HYSTERECTOMY  1990    Assessment & Plan Clinical Impression: [name] is a 80 year old right-handed [place] speaking female with history of chronic atrial fibrillation currently maintained on Eliquis.  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  Per chart review patient lives with her daughter and son-in-law.  She also has other family in the area.  Two-level home bed and bath main level 2 steps to entry.  Independent with assistive device using a rolling walker as well is reportedly independent with ADLs.  Presented 10/17/2020 after mechanical fall while using her walker landing on her right hip.  No loss of consciousness.  Cranial CT scan negative for acute changes.  X-rays and imaging revealed moderately displaced and comminuted fracture involving the right inferior pubic ramus.  Nondisplaced fracture involving the right superior pubic ramus at its junction with the anterior acetabulum.  Orthopedic service follow-up Dr. Mardelle Matte conservative care no surgical intervention weightbearing as tolerated.  Admission chemistries WBC 12,100, glucose 138, hemoglobin A1c 6.5, troponin negative.  She currently remains on Eliquis as prior to admission.  Pain control with the use of the Lidoderm patch as needed as well as oxycodone/Zanaflex.  Due to patient decrease in functional mobility as well as history of CVA left side weakness physical medicine rehab consult was contacted patient was admitted for a comprehensive rehab program.  Patient transferred to [place] on 10/20/2020 .    Patient currently requires max with basic self-care skills secondary to muscle weakness, decreased cardiorespiratoy endurance, impaired timing and sequencing, abnormal tone, unbalanced muscle activation and decreased motor planning and decreased sitting balance, decreased standing balance, decreased postural control, hemiplegia and decreased balance strategies.  Prior to hospitalization,  patient could complete BADL with modified independent .  Patient will benefit from skilled intervention to decrease level of assist with basic self-care skills  prior to discharge home with care partner.  Anticipate patient will require 24 hour supervision and follow up home health.  OT - End of Session Activity Tolerance: Tolerates 30+ min activity with multiple rests Endurance Deficit: Yes OT Assessment OT Patient demonstrates impairments in the following area(s): Balance;Cognition;Endurance;Motor;Pain;Safety OT Basic ADL's Functional Problem(s): Grooming;Bathing;Dressing;Toileting OT Transfers Functional Problem(s): Tub/Shower;Toilet OT Additional Impairment(s): None OT Plan OT Intensity: Minimum of 1-2 x/day, 45 to 90 minutes OT Frequency: 5 out of 7 days OT Duration/Estimated Length of Stay: 14-16 days OT Treatment/Interventions: Balance/vestibular training;Discharge planning;Pain management;Self Care/advanced ADL retraining;Therapeutic Activities;UE/LE Coordination activities;Cognitive remediation/compensation;Disease mangement/prevention;Functional mobility training;Patient/family education;Skin care/wound managment;Therapeutic Exercise;Wheelchair propulsion/positioning;UE/LE Strength taining/ROM;Neuromuscular re-education;DME/adaptive equipment instruction;Community reintegration;Psychosocial support;Splinting/orthotics OT Self Feeding Anticipated Outcome(s): no goal OT Basic Self-Care Anticipated Outcome(s): Min for LB, supervision for UB OT Toileting Anticipated Outcome(s): Min A OT Bathroom Transfers Anticipated Outcome(s): Supervision OT Recommendation Patient destination: Home Follow Up Recommendations: Home health OT Equipment Recommended: To be determined Equipment Details: already has BSC and TTB   OT Evaluation Precautions/Restrictions  Precautions Precautions: Fall Precaution Comments: 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, pelvic pain, [place] - needs translator Restrictions Weight Bearing Restrictions: No RLE Weight Bearing: Weight bearing as tolerated Other Position/Activity Restrictions: WBAT Pain Pain Assessment Pain Scale: 0-10 Pain  Score: 9  Home Living/Prior Functioning Home Living Family/patient expects to be discharged to:: Private residence Living Arrangements: Children,Other relatives Available Help at Discharge: Family Type of Home: House Home Access: Stairs to enter Technical brewer of Steps: 2 Entrance Stairs-Rails: Left Home Layout: Two level,Able to live on main level with bedroom/bathroom Bathroom Shower/Tub: Chiropodist: Handicapped height Bathroom Accessibility: Yes  Lives With: Daughter,Family Prior Function Level of Independence: Requires assistive device for independence,Independent with basic ADLs Vision Patient Visual Report: No change from baseline Perception  Perception: Within Functional Limits Praxis Praxis: Impaired Praxis-Other Comments: 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 Cognition Overall Cognitive Status: History of cognitive impairments - at baseline Arousal/Alertness: Awake/alert Orientation Level: Person;Situation Year: Other (Comment) (2002) Month: November Day of Week: Correct Memory: Impaired Immediate Memory Recall: Sock;Blue;Bed Memory Recall Sock: Not able to recall Memory Recall Blue: With Cue Memory Recall Bed: With Cue Attention: Sustained Sustained Attention: Appears intact Safety/Judgment: Impaired Sensation Sensation Light Touch: Appears Intact Hot/Cold: Not tested Proprioception: Not tested Stereognosis: Not tested Additional Comments: occasional numbness in feet Coordination Gross Motor Movements are Fluid and Coordinated: No Fine Motor Movements are Fluid and Coordinated: No Coordination and Movement Description: limited on R by pain, on L by weakness from old CVA Motor  Motor Motor: Abnormal postural alignment and control;Motor apraxia Motor - Skilled Clinical Observations: difficulty sequencing at times for sit to stands and ADL, very broad base standing  Trunk/Postural Assessment  Cervical Assessment Cervical Assessment: Exceptions to  [facility] Thoracic Assessment Thoracic Assessment: Exceptions to [facility] Lumbar Assessment Lumbar Assessment: Exceptions to [facility] Postural Control Postural Control: Deficits on evaluation  Balance Balance Balance Assessed: Yes Static Sitting Balance Static Sitting - Balance Support: Feet supported Static Sitting - Level of Assistance: 5: Stand by assistance Dynamic Sitting Balance Dynamic Sitting - Balance Support: Feet supported;Left upper extremity supported Dynamic Sitting - Level of Assistance: 4: Min assist Dynamic Sitting - Balance Activities: Lateral lean/weight shifting;Forward lean/weight shifting Static Standing Balance Static Standing - Balance Support: During functional activity;Bilateral upper extremity supported Static Standing - Level of Assistance: 4: Min assist Static Standing -  Comment/# of Minutes: Able to balance while brief changed in standing with min A and bilateral UE support Dynamic Standing Balance Dynamic Standing - Level of Assistance: 3: Mod assist Dynamic Standing - Comments: assist during gait activities. Extremity/Trunk Assessment RUE Assessment RUE Assessment: Within Functional Limits General Strength Comments: generalized weakness but functional LUE Assessment LUE Assessment: Exceptions to [facility] Active Range of Motion (AROM) Comments: limited from previous CVA, approx 100* shoulder flexion, WFL for elbow/wrist, decreased grip General Strength Comments: decreased AROM and strength from previous CVA  Care Tool Care Tool Self Care Eating    Set up    Oral Care    Oral Care Assist Level: Set up assist    Bathing   Body parts bathed by patient: Right arm;Left arm;Chest;Abdomen;Front perineal area;Right upper leg;Left upper leg;Face Body parts bathed by helper: Buttocks;Right lower leg;Left lower leg   Assist Level: Moderate Assistance - Patient 50 - 74%    Upper Body Dressing(including orthotics)   What is the patient wearing?: Pull over shirt   Assist  Level: Minimal Assistance - Patient > 75%    Lower Body Dressing (excluding footwear)   What is the patient wearing?: Underwear/pull up;Pants Assist for lower body dressing: Total Assistance - Patient < 25%    Putting on/Taking off footwear   What is the patient wearing?: Non-skid slipper socks Assist for footwear: Dependent - Patient 0%       Care Tool Toileting Toileting activity   Assist for toileting: Total Assistance - Patient < 25%     Care Tool Bed Mobility Roll left and right activity    Max    Sit to lying activity    max    Lying to sitting edge of bed activity    max     Care Tool Transfers Sit to stand transfer   Sit to stand assist level: Moderate Assistance - Patient 50 - 74%    Chair/bed transfer    Max A      Toilet transfer   Assist Level: Dependent - Patient 0% (Pt already on commode, Stedy back to recliner)     Care Tool Cognition Expression of Ideas and Wants Expression of Ideas and Wants: Without difficulty (complex and basic) - expresses complex messages without difficulty and with speech that is clear and easy to understand   Understanding Verbal and Non-Verbal Content Understanding Verbal and Non-Verbal Content: Usually understands - understands most conversations, but misses some part/intent of message. Requires cues at times to understand   Memory/Recall Ability *first 3 days only Memory/Recall Ability *first 3 days only: That he or she is in a hospital/hospital unit    Refer to Care Plan for Long Term Goals  SHORT TERM GOAL WEEK 1 OT Short Term Goal 1 (Week 1): pt will perform sit to stand with Min A in prep for LB ADL OT Short Term Goal 2 (Week 1): Pt will transfer with Mod A to toilet with LRAD OT Short Term Goal 3 (Week 1): Pt will perform clothing management for toilet tasks with no more than Mod A OT Short Term Goal 4 (Week 1): Pt will perform LB bathing with AE PRN with Min A  Recommendations for other services: None    Skilled  Therapeutic Intervention ADL ADL Grooming: Setup Where Assessed-Grooming: Sitting at sink Upper Body Bathing: Supervision/safety Where Assessed-Upper Body Bathing: Sitting at sink Lower Body Bathing: Moderate assistance Where Assessed-Lower Body Bathing: Sitting at sink Upper Body Dressing: Minimal assistance Where Assessed-Upper Body Dressing:  Sitting at sink Lower Body Dressing: Dependent Where Assessed-Lower Body Dressing: Wheelchair Toileting: Maximal assistance Where Assessed-Toileting: Toilet;Bedside Commode Toilet Transfer: Other (comment) [name]) Mobility  Bed Mobility Bed Mobility: Supine to Sit;Sitting - Scoot to Edge of Bed Supine to Sit: Maximal Assistance - Patient - Patient 25-49% Transfers Sit to Stand: Maximal Assistance - Patient 25-49% Stand to Sit: Moderate Assistance - Patient 50-74%    Skilled Intervention: Pt and daughter greeted at time of session with interpreter present as well. Both assisted with translation during session. Discussed role and purpose of OT with both pt and daughter verbalizing understanding. See above and below for details.  Pt on toilet at time of session with nursing staff using Stedy, able to perform pericare hygiene with Min A and urine only, no BM. Sit to stand at [place], transferred to recliner via Stedy. At this time, discussion and education with pt and daughter. Pt agreeable to ADL, stedy transfer Mod to wheelchair and set up at sink level, UB bathe Supervision and LB bathe Mod/Max A to reach feet (declined washing but would need help) and past knee level d/t discomfort with bending. Dried off in same manner, UB dress Min for fully pulling down in the back and LB dress standing at [place] with total A for threading and pulling up over hips in standing. Family ed and discussion throughout session regarding goals, POC, and purpose of OT. Pt left sitting up in recliner for lunch, alarm on call bell in reach.     Discharge Criteria:  Patient will be discharged from OT if patient refuses treatment 3 consecutive times without medical reason, if treatment goals not met, if there is a change in medical status, if patient makes no progress towards goals or if patient is discharged from hospital.  The above assessment, treatment plan, treatment alternatives and goals were discussed and mutually agreed upon: by patient and by family  Viona Gilmore 10/21/2020, 12:48 PM

## 2020-10-21 NOTE — Plan of Care (Signed)
  Problem: RH Balance Goal: LTG Patient will maintain dynamic standing with ADLs (OT) Description: LTG:  Patient will maintain dynamic standing balance with assist during activities of daily living (OT)  Flowsheets (Taken 10/21/2020 1645) LTG: Pt will maintain dynamic standing balance during ADLs with: Supervision/Verbal cueing   Problem: Sit to Stand Goal: LTG:  Patient will perform sit to stand in prep for activites of daily living with assistance level (OT) Description: LTG:  Patient will perform sit to stand in prep for activites of daily living with assistance level (OT) Flowsheets (Taken 10/21/2020 1645) LTG: PT will perform sit to stand in prep for activites of daily living with assistance level: Supervision/Verbal cueing   Problem: RH Grooming Goal: LTG Patient will perform grooming w/assist,cues/equip (OT) Description: LTG: Patient will perform grooming with assist, with/without cues using equipment (OT) Flowsheets (Taken 10/21/2020 1645) LTG: Pt will perform grooming with assistance level of: Set up assist    Problem: RH Bathing Goal: LTG Patient will bathe all body parts with assist levels (OT) Description: LTG: Patient will bathe all body parts with assist levels (OT) Flowsheets (Taken 10/21/2020 1645) LTG: Pt will perform bathing with assistance level/cueing: Minimal Assistance - Patient > 75%   Problem: RH Dressing Goal: LTG Patient will perform upper body dressing (OT) Description: LTG Patient will perform upper body dressing with assist, with/without cues (OT). Flowsheets (Taken 10/21/2020 1645) LTG: Pt will perform upper body dressing with assistance level of: Set up assist Goal: LTG Patient will perform lower body dressing w/assist (OT) Description: LTG: Patient will perform lower body dressing with assist, with/without cues in positioning using equipment (OT) Flowsheets (Taken 10/21/2020 1645) LTG: Pt will perform lower body dressing with assistance level of: Minimal Assistance  - Patient > 75%   Problem: RH Toileting Goal: LTG Patient will perform toileting task (3/3 steps) with assistance level (OT) Description: LTG: Patient will perform toileting task (3/3 steps) with assistance level (OT)  Flowsheets (Taken 10/21/2020 1645) LTG: Pt will perform toileting task (3/3 steps) with assistance level: Supervision/Verbal cueing   Problem: RH Toilet Transfers Goal: LTG Patient will perform toilet transfers w/assist (OT) Description: LTG: Patient will perform toilet transfers with assist, with/without cues using equipment (OT) Flowsheets (Taken 10/21/2020 1645) LTG: Pt will perform toilet transfers with assistance level of: Supervision/Verbal cueing   Problem: RH Tub/Shower Transfers Goal: LTG Patient will perform tub/shower transfers w/assist (OT) Description: LTG: Patient will perform tub/shower transfers with assist, with/without cues using equipment (OT) Flowsheets (Taken 10/21/2020 1645) LTG: Pt will perform tub/shower stall transfers with assistance level of: Supervision/Verbal cueing

## 2020-10-21 NOTE — Progress Notes (Signed)
PHYSICAL MEDICINE & REHABILITATION PROGRESS NOTE   Subjective/Complaints:   Pt reports had purewick last night- was happy with it-  Has bad throbbing HA this AM- they just gave her tylenol for it. NO BM since admission to CIR- now been 3-4 days- will give sorbitol to get her ot have BM.   Asking for lidoderm patches on low back and R hip to reduce pain med usage.    ROS:  Pt denies SOB, abd pain, CP, N/V/C/D, and vision changes  Objective:   No results found. Recent Labs    10/20/20 0453 10/21/20 0543  WBC 11.5* 8.6  HGB 10.5* 10.1*  HCT 32.6* 31.3*  PLT 225 242   Recent Labs    10/20/20 0453 10/21/20 0543  NA 139 138  K 4.3 3.9  CL 107 102  CO2 24 25  GLUCOSE 154* 147*  BUN 19 15  CREATININE 0.64 0.60  CALCIUM 8.3* 8.2*    Intake/Output Summary (Last 24 hours) at 10/21/2020 1911 Last data filed at 10/21/2020 1725 Gross per 24 hour  Intake 338 ml  Output --  Net 338 ml        Physical Exam: Vital Signs Blood pressure 116/68, pulse 86, temperature (!) 97.4 F (36.3 C), temperature source Oral, resp. rate 18, weight 64.6 kg, SpO2 98 %.  Constitutional:   Pt awake, sitting in bedside chair, appropriate, daughter at bedside, NAD HENT: conjugate gaze- holding head due to HA Cardiovascular: irregular- rate controlled Pulmonary: CTA B/L- no W/R/R- good air movement Abdominal: soft, slightly TTP diffusely, no rebound, slightly distended, normoactive this AM Musculoskeletal: TTP over R groin/R hip    Comments: UEs 5-/5 in RUE- hard to get pt to participate, but appears 5-/5 LUE- 4/5 in LUE in same muscles tested RLE- HF 1/5? Pain; KE 2-/5, pain?, DF and PF at least 4/5, but limited by pain as well LLE- HF 2/5? Pain and weak; KE 3-/5, DF and PF 3-/5  Skin:bruising as below    Comments: No skin breakdown- has some bruising on hips, buttocks and thighs R>L  Neurological: pt appears at least Ox2- daughter is interpretor. Pt does have poor  memory  Assessment/Plan: 1. Functional deficits which require 3+ hours per day of interdisciplinary therapy in a comprehensive inpatient rehab setting.  Physiatrist is providing close team supervision and 24 hour management of active medical problems listed below.  Physiatrist and rehab team continue to assess barriers to discharge/monitor patient progress toward functional and medical goals  Care Tool:  Bathing    Body parts bathed by patient: Right arm,Left arm,Chest,Abdomen,Front perineal area,Right upper leg,Left upper leg,Face   Body parts bathed by helper: Buttocks,Right lower leg,Left lower leg     Bathing assist Assist Level: Moderate Assistance - Patient 50 - 74%     Upper Body Dressing/Undressing Upper body dressing   What is the patient wearing?: Pull over shirt    Upper body assist Assist Level: Minimal Assistance - Patient > 75%    Lower Body Dressing/Undressing Lower body dressing      What is the patient wearing?: Underwear/pull up     Lower body assist Assist for lower body dressing: Total Assistance - Patient < 25%     Toileting Toileting    Toileting assist Assist for toileting: Maximal Assistance - Patient 25 - 49%     Transfers Chair/bed transfer  Transfers assist     Chair/bed transfer assist level: Dependent - mechanical lift     Locomotion Ambulation  Ambulation assist      Assist level: Maximal Assistance - Patient 25 - 49% Assistive device: Walker-rolling Max distance: 5   Walk 10 feet activity   Assist  Walk 10 feet activity did not occur: Safety/medical concerns        Walk 50 feet activity   Assist Walk 50 feet with 2 turns activity did not occur: Safety/medical concerns         Walk 150 feet activity   Assist Walk 150 feet activity did not occur: Safety/medical concerns         Walk 10 feet on uneven surface  activity   Assist Walk 10 feet on uneven surfaces activity did not occur:  Safety/medical concerns         Wheelchair     Assist Will patient use wheelchair at discharge?: No Type of Wheelchair: Manual    Wheelchair assist level: Minimal Assistance - Patient > 75% Max wheelchair distance: 53'    Wheelchair 50 feet with 2 turns activity    Assist    Wheelchair 50 feet with 2 turns activity did not occur: Safety/medical concerns       Wheelchair 150 feet activity     Assist  Wheelchair 150 feet activity did not occur: Safety/medical concerns       Blood pressure 116/68, pulse 86, temperature (!) 97.4 F (36.3 C), temperature source Oral, resp. rate 18, weight 64.6 kg, SpO2 98 %.  Medical Problem List and Plan: 1.  Decreased functional mobility secondary to right superior and inferior pubic rami fracture nondisplaced after a fall.  Weightbearing as tolerated.  Follow-up Dr. Dion Saucier             -patient may  shower             -ELOS/Goals: 10-14 days- supervision to CGA 2.  Antithrombotics: -DVT/anticoagulation: Chronic Eliquis             -antiplatelet therapy: N/A 3. Pain Management: Neurontin 100 mg nightly as needed, Lidoderm patch as directed oxycodone and Zanaflex as needed- will add tramadol 50 mg q6 hours prn since pt says Percocet wears off too fast.   1/5- will add lidoderm patches 2 patches 12 hrs on;12 hrs off- 8am to 8pm 4. Mood: Provide emotional support             -antipsychotic agents: N/A 5. Neuropsych: This patient is? capable of making decisions on her own behalf. 6. Skin/Wound Care: Routine skin checks 7. Fluids/Electrolytes/Nutrition: Routine in and outs with follow-up chemistries 8.  Chronic atrial fibrillation.  Continue Eliquis.  Cardiac rate controlled.  Lopressor 12.5 mg twice daily  1/5- rate controlled- con't regimen 9.  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  Received CIR 10/19/2016 to 11/01/2016.  Patient  used a rolling walker prior to admission. 10.  Diabetes mellitus.  Hemoglobin A1c 6.4.  Metformin resumed 500 mg twice daily  1/5- restarted metformin and stopped insulin this AM- didn't give it-  11.  Hyperlipidemia.  Pravachol 12.  GERD.  Protonix 13.  Constipation.  Colace 100 mg twice daily, MiraLAX as needed- also senokot 1 tab BID- if doesn't work, will do sorbitol tomorrow  1/5- will give sorbitol this evening 14. Urinary frequency- will give purewick at night.  1/5- working well- con't purewick, but only at night.      LOS: 1 days A FACE TO FACE EVALUATION  WAS PERFORMED  [name] 10/21/2020, 7:11 PM

## 2020-10-21 NOTE — Progress Notes (Signed)
Inpatient Rehabilitation Care Coordinator Assessment and Plan Patient Details  Name: [name] MRN: [id number] Date of Birth: 07-20-1941  Today's Date: 10/21/2020  Hospital Problems: Principal Problem:   Pelvic fracture [facility])  Past Medical History:  Past Medical History:  Diagnosis Date  . Atrial fibrillation (HCC)   . Chronic back pain    "mid back down into lower back" (08/25/2014)  . Frequent falls   . GERD (gastroesophageal reflux disease)   . Hypertriglyceridemia   . OSA on CPAP   . Osteoarthritis    "knees, hands, back" (08/25/2014)  . Pneumonia ~ 2000 X 1  . Subdural hematoma (HCC) july 2015   S/P fall while on Coumadin  . T12 compression fracture (HCC) 2012  . Type II diabetes mellitus (HCC)    Past Surgical History:  Past Surgical History:  Procedure Laterality Date  . APPENDECTOMY  2012  . CATARACT EXTRACTION W/ INTRAOCULAR LENS  IMPLANT, BILATERAL Bilateral 2000's  . IR GENERIC HISTORICAL  10/15/2016   IR PERCUTANEOUS ART THROMBECTOMY/INFUSION INTRACRANIAL INC DIAG ANGIO 10/15/2016 [name], MD [facility]  . IR GENERIC HISTORICAL  12/13/2016   IR RADIOLOGIST EVAL & MGMT 12/13/2016 [facility]  . RADIOLOGY WITH ANESTHESIA N/A 10/15/2016   Procedure: RADIOLOGY WITH ANESTHESIA;  Surgeon: [name], MD;  Location: [facility] OR;  Service: Radiology;  Laterality: N/A;  . TOTAL ABDOMINAL HYSTERECTOMY  1990   Social History:  reports that she is a non-smoker but has been exposed to tobacco smoke. She has never used smokeless tobacco. She reports that she does not drink alcohol and does not use drugs.  Family / Support Systems Marital Status: Widow/Widower Patient Roles: Parent ChildrenSeba Madole [phone]  Nobie Putnam [phone] Other Supports: Foster Simpson [phone] Anticipated Caregiver: Daughter and daughter in-law Ability/Limitations of Caregiver: Supervision for 2 weeks due to FMLA Caregiver Availability:  Intermittent Family Dynamics: Close knit with family have ben living with them for years. Daughter and family members have been assisting with care, but pt was functionng at a mod/i with rollator prior to admission. Needed help with medications. 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 while here Education: Some education in [place] Read: Yes (Primary language) Write: Yes (primary language) Employment Status: Retired Marine scientist Issues: No issues Guardian/Conservator: None-according to MD pt is not fully capable of making her own decisions while here, will look toward her daughter if any decisions need to be made while here   Abuse/Neglect Abuse/Neglect Assessment Can Be Completed: Yes Physical Abuse: Denies Verbal Abuse: Denies Sexual Abuse: Denies Exploitation of patient/patient's resources: Denies Self-Neglect: Denies  Emotional Status Pt's affect, behavior and adjustment status: Pt is motivated to get back to her mod/i level with her walker. She was doing well before her fall. She likes being able to get around on her own and not bother her family. 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 Substance Abuse History: No issues  Patient / Family Perceptions, Expectations & Goals Pt/Family understanding of illness & functional limitations: Pt and daughter can explain her pelvic fracture, she is hopeful her pain will get better and she can move more now she is here. Daughter does talk with the MD and feels she has a good understanding of her treatment plan going forward. Premorbid  pt/family roles/activities: Mom, grandmother mother in-law, etc Anticipated changes in  roles/activities/participation: resume Pt/family expectations/goals: Pt states: " I need to get moving."  Daughter states: " I hope she can be mobile with her walker, this will help all of [place]."  [employer]: Other (Comment) (has had HH and OP when had CVA 2018) Premorbid Home Care/DME Agencies: Other (Comment) (rollator, bsc, tub bench) Transportation available at discharge: Family members  Discharge Planning Living Arrangements: Children,Other relatives Support Systems: Human resources officer relatives,Church/faith community Type of Residence: Private residence Insurance Resources: [employer] (specify county) Architect: Family Youth worker Screen Referred: No Living Expenses: Lives with family Money Management: Family Does the patient have any problems obtaining your medications?: No Home Management: Family members Patient/Family Preliminary Plans: Return home with duaghter and her family between daughter and daughter in-law can provide short term 2-weeks of coverage. Both hope she can be mobile with her rolling walker and will only need intermittent supervision. Aware at first will need 24/7 supervision for transition home. been here and before and knows the routine Care Coordinator Barriers to Discharge: Other (comments) Care Coordinator Barriers to Discharge Comments: needs interpreter while here Care Coordinator Anticipated Follow Up Needs: HH/OP  Clinical Impression Pleasant female who has been here before and is glad to be back to get much rehab. Daughter is present in the room and voiced for two weeks pt will have 24/7 then intermittent assist. Pt was mod/i with rollator prior to admission and the hope she can get as close to this level as possible prior to discharge. Discussed CAP and PCS services, daughter aware of waiting list for CAP services. Await evaluations and work on discharge needs.  Lucy Chris 10/21/2020, 10:01 AM

## 2020-10-22 ENCOUNTER — Inpatient Hospital Stay (HOSPITAL_COMMUNITY): Payer: Medicare Other

## 2020-10-22 ENCOUNTER — Inpatient Hospital Stay (HOSPITAL_COMMUNITY): Payer: Medicare Other | Admitting: Occupational Therapy

## 2020-10-22 DIAGNOSIS — S32501D Unspecified fracture of right pubis, subsequent encounter for fracture with routine healing: Secondary | ICD-10-CM | POA: Diagnosis not present

## 2020-10-22 LAB — GLUCOSE, CAPILLARY
Glucose-Capillary: 120 mg/dL — ABNORMAL HIGH (ref 70–99)
Glucose-Capillary: 102 mg/dL — ABNORMAL HIGH (ref 70–99)
Glucose-Capillary: 110 mg/dL — ABNORMAL HIGH (ref 70–99)
Glucose-Capillary: 153 mg/dL — ABNORMAL HIGH (ref 70–99)

## 2020-10-22 NOTE — Progress Notes (Signed)
[name] PHYSICAL MEDICINE & REHABILITATION PROGRESS NOTE   Subjective/Complaints:   Pt/daughter reports no BM, but according to nurse, had multiple BMs after sorbitol last night-  Is usually tired due to Afib, but no more than usual this AM.  C/O dizziness with change in position- trace dizziness right now while on toilet-   Asked Nursing to check orthostatics on her.      ROS:  Pt denies SOB, abd pain, CP, N/V/C/D, and vision changes  via daughter  Objective:   No results found. Recent Labs    10/20/20 0453 10/21/20 0543  WBC 11.5* 8.6  HGB 10.5* 10.1*  HCT 32.6* 31.3*  PLT 225 242   Recent Labs    10/20/20 0453 10/21/20 0543  NA 139 138  K 4.3 3.9  CL 107 102  CO2 24 25  GLUCOSE 154* 147*  BUN 19 15  CREATININE 0.64 0.60  CALCIUM 8.3* 8.2*    Intake/Output Summary (Last 24 hours) at 10/22/2020 0904 Last data filed at 10/22/2020 0700 Gross per 24 hour  Intake 395 ml  Output --  Net 395 ml        Physical Exam: Vital Signs Blood pressure 101/68, pulse 90, temperature 98.9 F (37.2 C), temperature source Oral, resp. rate 17, weight 64.6 kg, SpO2 96 %.  Constitutional:   Pt awake, sitting on toilet with nurse and daughter in room, NAD HENT: conjugate gaze- holding head due to HA Cardiovascular: regular-Afib- rate controlled B/L-  Pulmonary:CTA B/L- no W/R/R- good air movement Abdominal: soft, NT, ND, (+)BS- normoactive BS this AM Musculoskeletal: TTP over R groin/R hip- no change    Comments: UEs 5-/5 in RUE- hard to get pt to participate, but appears 5-/5 LUE- 4/5 in LUE in same muscles tested RLE- HF 1/5? Pain; KE 2-/5, pain?, DF and PF at least 4/5, but limited by pain as well LLE- HF 2/5? Pain and weak; KE 3-/5, DF and PF 3-/5  Skin:bruising as below    Comments: No skin breakdown- has some bruising on hips, buttocks and thighs R>L  Neurological:Ox2 per daughter who's interpretor- poor memory- didn't remember multiple stools overnight.    Assessment/Plan: 1. Functional deficits which require 3+ hours per day of interdisciplinary therapy in a comprehensive inpatient rehab setting.  Physiatrist is providing close team supervision and 24 hour management of active medical problems listed below.  Physiatrist and rehab team continue to assess barriers to discharge/monitor patient progress toward functional and medical goals  Care Tool:  Bathing    Body parts bathed by patient: Right arm,Left arm,Chest,Abdomen,Front perineal area,Right upper leg,Left upper leg,Face   Body parts bathed by helper: Buttocks,Right lower leg,Left lower leg     Bathing assist Assist Level: Moderate Assistance - Patient 50 - 74%     Upper Body Dressing/Undressing Upper body dressing   What is the patient wearing?: Pull over shirt    Upper body assist Assist Level: Minimal Assistance - Patient > 75%    Lower Body Dressing/Undressing Lower body dressing      What is the patient wearing?: Underwear/pull up     Lower body assist Assist for lower body dressing: Total Assistance - Patient < 25%     Toileting Toileting    Toileting assist Assist for toileting: Moderate Assistance - Patient 50 - 74%     Transfers Chair/bed transfer  Transfers assist     Chair/bed transfer assist level: Dependent - mechanical lift     Locomotion Ambulation   Ambulation assist  Assist level: Maximal Assistance - Patient 25 - 49% Assistive device: Walker-rolling Max distance: 5   Walk 10 feet activity   Assist  Walk 10 feet activity did not occur: Safety/medical concerns        Walk 50 feet activity   Assist Walk 50 feet with 2 turns activity did not occur: Safety/medical concerns         Walk 150 feet activity   Assist Walk 150 feet activity did not occur: Safety/medical concerns         Walk 10 feet on uneven surface  activity   Assist Walk 10 feet on uneven surfaces activity did not occur: Safety/medical  concerns         Wheelchair     Assist Will patient use wheelchair at discharge?: No Type of Wheelchair: Manual    Wheelchair assist level: Minimal Assistance - Patient > 75% Max wheelchair distance: 78'    Wheelchair 50 feet with 2 turns activity    Assist    Wheelchair 50 feet with 2 turns activity did not occur: Safety/medical concerns       Wheelchair 150 feet activity     Assist  Wheelchair 150 feet activity did not occur: Safety/medical concerns       Blood pressure 101/68, pulse 90, temperature 98.9 F (37.2 C), temperature source Oral, resp. rate 17, weight 64.6 kg, SpO2 96 %.  Medical Problem List and Plan: 1.  Decreased functional mobility secondary to right superior and inferior pubic rami fracture nondisplaced after a fall.  Weightbearing as tolerated.  Follow-up Dr. Dion Saucier             -patient may  shower             -ELOS/Goals: 10-14 days- supervision to CGA 2.  Antithrombotics: -DVT/anticoagulation: Chronic Eliquis             -antiplatelet therapy: N/A 3. Pain Management: Neurontin 100 mg nightly as needed, Lidoderm patch as directed oxycodone and Zanaflex as needed- will add tramadol 50 mg q6 hours prn since pt says Percocet wears off too fast.   1/5- will add lidoderm patches 2 patches 12 hrs on;12 hrs off- 8am to 8pm  1/6- pain is basically controlled per pt- con't regimen 4. Mood: Provide emotional support             -antipsychotic agents: N/A 5. Neuropsych: This patient is? capable of making decisions on her own behalf. 6. Skin/Wound Care: Routine skin checks 7. Fluids/Electrolytes/Nutrition: Routine in and outs with follow-up chemistries 8.  Chronic atrial fibrillation.  Continue Eliquis.  Cardiac rate controlled.  Lopressor 12.5 mg twice daily  1/6 is tired, but no more than normal- rate controlled con't regimen 9.  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  Received CIR 10/19/2016 to 11/01/2016.  Patient used a rolling walker prior to admission. 10.  Diabetes mellitus.  Hemoglobin A1c 6.4.  Metformin resumed 500 mg twice daily  1/5- restarted metformin and stopped insulin this AM- didn't give it-   1/6- BGs 99-128 in last 24 hours- doing great- con't regimen 11.  Hyperlipidemia.  Pravachol 12.  GERD.  Protonix 13.  Constipation.  Colace 100 mg twice daily, MiraLAX as needed- also senokot 1 tab BID- if doesn't work, will do sorbitol tomorrow  1/5- will give sorbitol this evening  1/6- had multiple BMs last night- con't regimen 14.  Urinary frequency- will give purewick at night.  1/5- working well- con't purewick, but only at night.   1/6- didn't use purewick last night because frequent stools. But will nightly.      LOS: 2 days A FACE TO FACE EVALUATION WAS PERFORMED  [name] 10/22/2020, 9:04 AM

## 2020-10-22 NOTE — Progress Notes (Signed)
Physical Therapy Session Note  Patient Details  Name: [name] MRN: [id number] Date of Birth: 07-12-1941  Today's Date: 10/22/2020 PT Individual Time:Session1: 4580-9983; Session2: 3825-0539 PT Individual Time Calculation (min): 60 min & 55 min  Short Term Goals: Week 1:  PT Short Term Goal 1 (Week 1): Patient to perform bed mobility with min to mod A. PT Short Term Goal 2 (Week 1): Patient to perform sit to stand with min to mod A. PT Short Term Goal 3 (Week 1): Patient to ambulate 7' with RW and min A. PT Short Term Goal 4 (Week 1): Patient to perform standing activity with 1 UE support and min A for improved balance.  Skilled Therapeutic Interventions/Progress Updates:    Session1:  Patient in recliner and RN in the room to deliver medication.  Daughter present for interpreting.  Patient sit to stand mod A and took 3 steps forward with RW and mod A increased time, placed w/c behind pt to sit.  Patient in w/c assisted to therapy gym.  Seated for LAQ and hip flexion x 5-10 reps due to pain in thighs with hip flexion.  Sit to stand in parallel bars for attempting hip flexion with mod A pt unable to lift due to pain.  Attempted step taps to circular discs on floor, but needed assist for 1 tap each foot then needed to sit due to pain.  Patient in w/c propelled x 80' with min A mod to max cues and focus on L reaching higher on wheel and pushing through.  Needed several rest breaks.  PAtient stand step to recliner mod A max cues and assist to facilitation movement on L.  Patient in recliner for ankle pumps, hip adductor squeezes and SAQ x 10. Left in recliner with daughter present and chair alarm activated.  Session2:  Patient standing in room with NT for orthostatic vital signs.  Daughter in the room and present throughout session to interpret.  Placed w/c behind pt and assisted to scoot back in w/c.  RN informed and brought pain medication as daughter felt like previous medication not helping enough.   Assisted in w/c to therapy gym.  At stairs sit to stand mod A with cues.  Negpotiated 3 (3") steps with max A for lifting each leg to place on step, pt using both rails and some lifting assist.  Turning on step to descend assist to turn and assist for lowering leg off step R first.  Patient sit to stand to ambulate with EVA walker mod A.  Max cues and mod to max A to ambulate 20' with EVA walker with 2-3 standing rest breaks and A for each LE progression.  Patient assisted in w/c to ortho gym and performed UBE at level 1 for 2 min forward and 2 min back.  Patient assisted to room.  Left in w/c with daughter present and NT aware pt likely will need to return to bed after eating to rest as lethargic from pain medication.    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

## 2020-10-22 NOTE — Progress Notes (Signed)
Pts daughter is concerned with the bruising on pts right inner thigh, bruising occurred prior to admission. Concerned about possible hematoma, and tenderness due to Pts legs rubbing together through out the day. Barrier cream in room to put on after toileting to help with friction. would like the doctor to look at bruising in the morning.   [name]

## 2020-10-22 NOTE — Progress Notes (Signed)
Occupational Therapy Session Note  Patient Details  Name: [name] MRN: [id number] Date of Birth: 01/23/1941  Today's Date: 10/22/2020 OT Individual Time: 1300-1413 OT Individual Time Calculation (min): 73 min    Short Term Goals: Week 1:  OT Short Term Goal 1 (Week 1): pt will perform sit to stand with Min A in prep for LB ADL OT Short Term Goal 2 (Week 1): Pt will transfer with Mod A to toilet with LRAD OT Short Term Goal 3 (Week 1): Pt will perform clothing management for toilet tasks with no more than Mod A OT Short Term Goal 4 (Week 1): Pt will perform LB bathing with AE PRN with Min A  Skilled Therapeutic Interventions/Progress Updates:    Pt greeted at time of session supine in bed resting with daughter present and able to interpret, pt sleeping but easily woken and agreeable to OT session. Daughter noted that nursing staff trying to get orthostatic vitals earlier and OT assisted, BP 93/71 supine and 103/76 sitting EOB with some dizziness noted with turning head to the L but improved. Pt agreeable to shower, sit to stand at walker in attempt to transfer to wheelchair but pain too severe (no number provided but pt premedicated) and returned to sitting EOB. Stedy transfer today sit to stand Min/Mod and transferred to shower bench, feet elevated on block for comfort. Mod A for LB bathing d/t discomfort with forward reaching, plan to give LHS. Pt able to wash hair and UB, rinsing as well. Dried off in same manner and assisted with drying/washing buttocks in standing at [facility]. LB dress total A for pants and brief in standing at [facility], transferred to bed to don socks and TEDS. At this time pt needed to use bathroom, Stedy transfer for urgency to [facility] over toilet and daughter/pt aware to call RN/NT when finished, spoke with RN and she is aware pt on commode.   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

## 2020-10-23 ENCOUNTER — Inpatient Hospital Stay (HOSPITAL_COMMUNITY): Payer: Medicare Other | Admitting: Occupational Therapy

## 2020-10-23 ENCOUNTER — Other Ambulatory Visit: Payer: Self-pay

## 2020-10-23 ENCOUNTER — Inpatient Hospital Stay (HOSPITAL_COMMUNITY): Payer: Medicare Other

## 2020-10-23 DIAGNOSIS — S32501D Unspecified fracture of right pubis, subsequent encounter for fracture with routine healing: Secondary | ICD-10-CM | POA: Diagnosis not present

## 2020-10-23 LAB — GLUCOSE, CAPILLARY
Glucose-Capillary: 129 mg/dL — ABNORMAL HIGH (ref 70–99)
Glucose-Capillary: 111 mg/dL — ABNORMAL HIGH (ref 70–99)
Glucose-Capillary: 130 mg/dL — ABNORMAL HIGH (ref 70–99)
Glucose-Capillary: 108 mg/dL — ABNORMAL HIGH (ref 70–99)

## 2020-10-23 LAB — TROPONIN I (HIGH SENSITIVITY)
Troponin I (High Sensitivity): 6 ng/L (ref <18)
Troponin I (High Sensitivity): 5 ng/L (ref <18)

## 2020-10-23 MED ORDER — PANTOPRAZOLE SODIUM 40 MG PO TBEC
40.0000 mg | DELAYED_RELEASE_TABLET | Freq: Every day | ORAL | Status: DC
  Administered 2020-10-23 – 2020-11-09 (×18): 40 mg via ORAL
  Filled 2020-10-23 (×18): qty 1

## 2020-10-23 NOTE — Progress Notes (Signed)
Pt c/o chest pain. Pt saying that it feels like  someone hit her at the chest, pain not radiating to back, shoulder or jaw.PRN Nitro given 1 st dose, no relief. 2 nd dose given. Pt reports that she feels much better.EKG is done. PA was made aware. see orders.

## 2020-10-23 NOTE — IPOC Note (Signed)
Overall Plan of Care [facility]) Patient Details Name: [name] MRN: [id number] DOB: 10/24/1940  Admitting Diagnosis: Pelvic fracture [facility])  Hospital Problems: Principal Problem:   Pelvic fracture (HCC)     Functional Problem List: Nursing Bladder,Bowel,Pain,Edema,Safety,Endurance,Medication Management  PT Balance,Pain,Endurance,Motor,Safety  OT Balance,Cognition,Endurance,Motor,Pain,Safety  SLP    TR         Basic ADL's: OT Grooming,Bathing,Dressing,Toileting     Advanced  ADL's: OT       Transfers: PT Bed Mobility,Bed to Chair,Car  OT Tub/Shower,Toilet     Locomotion: PT Ambulation,Stairs     Additional Impairments: OT None  SLP        TR      Anticipated Outcomes Item Anticipated Outcome  Self Feeding no goal  Swallowing      Basic self-care  Min for LB, supervision for UB  Toileting  Min A   Bathroom Transfers Supervision  Bowel/Bladder  to be continent x 2  Transfers  S  Locomotion  S household ambulation w/ RW  Communication     Cognition     Pain  less than 3 out of 10 on pain scale  Safety/Judgment  to remain fall free while in rehab   Therapy Plan: PT Intensity: Minimum of 1-2 x/day ,45 to 90 minutes PT Frequency: 5 out of 7 days PT Duration Estimated Length of Stay: 2 weeks OT Intensity: Minimum of 1-2 x/day, 45 to 90 minutes OT Frequency: 5 out of 7 days OT Duration/Estimated Length of Stay: 14-16 days     Due to the current state of emergency, patients may not be receiving their 3-hours of Medicare-mandated therapy.   Team Interventions: Nursing Interventions Patient/Family Education,Disease Management/Prevention,Skin Care/Wound Management,Discharge Planning,Bladder Management,Pain Management,Cognitive Remediation/Compensation,Psychosocial Support,Bowel Scientist, clinical (histocompatibility and immunogenetics)  PT interventions Ambulation/gait training,DME/adaptive equipment instruction,Functional mobility  training,Balance/vestibular training,Patient/family education,Therapeutic Activities,UE/LE Strength taining/ROM,Wheelchair propulsion/positioning,UE/LE Coordination activities,Therapeutic Environmental education officer  OT Interventions Balance/vestibular training,Discharge planning,Pain management,Self Care/advanced ADL retraining,Therapeutic Activities,UE/LE Coordination activities,Cognitive remediation/compensation,Disease mangement/prevention,Functional mobility training,Patient/family education,Skin care/wound managment,Therapeutic Exercise,Wheelchair propulsion/positioning,UE/LE Strength taining/ROM,Neuromuscular re-education,DME/adaptive equipment instruction,Community reintegration,Psychosocial support,Splinting/orthotics  SLP Interventions    TR Interventions    SW/CM Interventions Discharge Planning,Psychosocial Support,Patient/Family Education   Barriers to Discharge MD  Medical stability, Home enviroment access/loayout, Incontinence, Weight, Weight bearing restrictions and bruising and language barrier  Nursing      PT      OT      SLP      SW Other (comments) needs interpreter while here   Team Discharge Planning: Destination: PT-Home ,OT- Home , SLP-  Projected Follow-up: PT-Home health PT,24 hour supervision/assistance, OT-  Home health OT, SLP-  Projected Equipment Needs: PT-None recommended by PT, OT- To be determined, SLP-  Equipment Details: PT- , OT-already has BSC and TTB Patient/family involved in discharge planning: PT- Patient,Family member/caregiver,  OT-Patient,Family member/caregiver, SLP-   MD ELOS: 2 weeks  Medical Rehab Prognosis:  Good Assessment:  Pt is a 80 yr old female with R sup/inf pubic rami fx's- nondisplaced after fall- with pain pretty well controlled on Percocet and tramadol- and lidoderm patches- has inner thigh bruising- and started with CP today- also has Afib, Also DM- BGs doing OK; and constipation is improved.   Goals supervision to min A by  d/c.   See Team Conference Notes for weekly updates to the plan of care

## 2020-10-23 NOTE — Progress Notes (Signed)
Occupational Therapy Session Note  Patient Details  Name: [name] MRN: [id number] Date of Birth: 1941/06/16  Today's Date: 10/23/2020 OT Individual Time: 1030-1130 OT Individual Time Calculation (min): 60 min    Short Term Goals: Week 1:  OT Short Term Goal 1 (Week 1): pt will perform sit to stand with Min A in prep for LB ADL OT Short Term Goal 2 (Week 1): Pt will transfer with Mod A to toilet with LRAD OT Short Term Goal 3 (Week 1): Pt will perform clothing management for toilet tasks with no more than Mod A OT Short Term Goal 4 (Week 1): Pt will perform LB bathing with AE PRN with Min A  Skilled Therapeutic Interventions/Progress Updates:    Pt received in recliner with interpretor present. Pt agreeable to engaging in therapy.  She explained her fear of falling and that she is too weak to stand.  Encouraged her to try with Rw.   Pt able to rise to stand with RW with only mod A but hesitant to stand long, stating she just "couldn't".  To have pt brush teeth at sink, used stedy. Pt rose to stand in stedy with only CGA and was actually able to stand upright not even using pads when at the sink. Stood to brush teeth, wash face.   Pt did want to try to toilet. Transported to toilet with STedy (bsc over toilet).  She was able to transfer very easily.  Removed stedy and had pt stand with RW to push pants down with mod A.  Pt sat for a few minutes but unable to void.  Changed hospital briefs to her pull ups. Total A to don pull ups and pants over feet but pt then stood with RW and pulled pants over hips with mod A.    With lots of encouragement, pt used RW to step pivot to wc. She was only able to move her feet in very very small steps and it took some time, but she got it accomplished.  Sitting in wc, worked on AROM of BLE using leg lifter and using towel slides on the floor.  This was not uncomfortable for the pt and she did it quite well.  Stand pivot again wc to recliner with lots of time and  cues and pt taking very small steps.  Cues for relaxed breathing.   Pt resting in recliner with all needs met, chair alarm set.   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

## 2020-10-23 NOTE — Progress Notes (Signed)
Physical Therapy Session Note  Patient Details  Name: [name] MRN: [id number] Date of Birth: May 30, 1941  Today's Date: 10/23/2020 PT Individual Time:Session1: 2353-6144; [place]: [id number] PT Individual Time Calculation (min): 60 min   Short Term Goals: Week 1:  PT Short Term Goal 1 (Week 1): Patient to perform bed mobility with min to mod A. PT Short Term Goal 2 (Week 1): Patient to perform sit to stand with min to mod A. PT Short Term Goal 3 (Week 1): Patient to ambulate 6' with RW and min A. PT Short Term Goal 4 (Week 1): Patient to perform standing activity with 1 UE support and min A for improved balance.  Skilled Therapeutic Interventions/Progress Updates:     Orthostatic VS for the past 24 hrs (Last 3 readings):  BP- Lying Pulse- Lying BP- Sitting Pulse- Sitting BP- Standing at 0 minutes Pulse- Standing at 0 minutes  10/23/20 0900 122/67 92 199/69 86 (!) 116/94 100   Session1:  Patient in supine and indicates she needs help getting up and getting pants on.  Interpreter arrived after start of session.  Supine to sit with mod/max A for legs and trunk.  Patient assisted to don TED's, slipper socks and pants with max/total A.  Patient transferred to w/c with RW and mod A increased time, assist for weight shifts and moving legs for taking steps with feet.  Pushed in w/c to therapy gym and performed stand step to mat with RW and mod A.  Seated with feet on step performed rows and diagonal shoulder abduction with orange t-band, seated hip abduction with band around knees and hip flexion x 10 no resistance.  Patient sit to supine on the mat with mod A.  Performed SAQ with legs on bolster x 10 and bridging 2 x 5 reps.  Patient supine to sit with mod A.  Measured BP as noted above supine/sitting/standing.  Patient with c/o mild dizziness and some chest tightness, RN already aware.  Patient ambulated x 56' with RW and mod/max A two standing rest breaks A for lateral weight shifts and to progress  LE's.  Patient in w/c assisted to room.  Stand step to recliner mod A.  Left up in recliner feet on stool with call light and needs in reach.    Session2:  Patient in recliner on edge of seat after lunch.  Patient sit to stand with mod A to RW and stand step to w/c mod A limited so pulled w/c up.  Patient assisted to ortho gym in w/c.  Standing balance with intermittent UE support and min A to reach to touch targets on BITS 2 x 54 circles over 2.5 minutes each trial without seated rest in between. Patient transferred to Nu Step mod A and performed 5 minutes at level 2 for UE/LE.  Patient performed car transfer from w/c with RW and max A w/max cues for technique, seated at simulated minivan height then step under her feet and assist to scoot back, then assist for legs into car.  Patient fatigued, assist to get out and stand step back to w/c mod A.  Patient assisted in w/c to room and stand step to recliner mod A.  Left with chair alarm active and needs in reach.   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

## 2020-10-23 NOTE — Progress Notes (Signed)
[facility] PHYSICAL MEDICINE & REHABILITATION PROGRESS NOTE   Subjective/Complaints:  Pt reports had CP and tightness in chest this AM- has had in past- when was in [place]- NTG given x2 and helped a lot- much better, but still a little tight in chest.   EKG just shows Afib Troponin is not sky high  She also c/o legs being tired explained that Rehab is the only treatment for that-  also has bruising between legs- her daughter told nursing she was concerned about hematoma- esp due to rubbing of legs. Suggested antiperspirant to reduce rubbing.    ROS:  Pt denies SOB, abd pain, CP, N/V/C/D, and vision changes Per interpretor  Objective:   No results found. Recent Labs    10/21/20 0543  WBC 8.6  HGB 10.1*  HCT 31.3*  PLT 242   Recent Labs    10/21/20 0543  NA 138  K 3.9  CL 102  CO2 25  GLUCOSE 147*  BUN 15  CREATININE 0.60  CALCIUM 8.2*    Intake/Output Summary (Last 24 hours) at 10/23/2020 1020 Last data filed at 10/23/2020 0538 Gross per 24 hour  Intake 240 ml  Output 300 ml  Net -60 ml        Physical Exam: Vital Signs Blood pressure (!) 114/56, pulse 81, temperature 98 F (36.7 C), temperature source Oral, resp. rate 16, weight 64.6 kg, SpO2 95 %.  Constitutional:  pt awake, sitting up in bed; tele-interpretor used; nurse also in room, NAD, appears comfortable HENT: conjugate gaze- no change Cardiovascular: irregularly irregular- in Afib- no JVD  Pulmonary: CTA B/L- no W/R/R- good air movement Abdominal: Soft, NT, ND, (+)BS  Musculoskeletal: TTP over R groin/R hip- no change- also TTP over inner thighs- R>L and over chest- sternum    Comments: UEs 5-/5 in RUE- hard to get pt to participate, but appears 5-/5 LUE- 4/5 in LUE in same muscles tested RLE- HF 1/5? Pain; KE 2-/5, pain?, DF and PF at least 4/5, but limited by pain as well LLE- HF 2/5? Pain and weak; KE 3-/5, DF and PF 3-/5  Skin: bruising- purple/yellow on R>L inner thighs- no hematoma  identified- mild swelling associated Neurological: Ox2  Assessment/Plan: 1. Functional deficits which require 3+ hours per day of interdisciplinary therapy in a comprehensive inpatient rehab setting.  Physiatrist is providing close team supervision and 24 hour management of active medical problems listed below.  Physiatrist and rehab team continue to assess barriers to discharge/monitor patient progress toward functional and medical goals  Care Tool:  Bathing    Body parts bathed by patient: Right arm,Left arm,Chest,Abdomen,Front perineal area,Right upper leg,Left upper leg,Face   Body parts bathed by helper: Buttocks,Right lower leg,Left lower leg     Bathing assist Assist Level: Moderate Assistance - Patient 50 - 74%     Upper Body Dressing/Undressing Upper body dressing   What is the patient wearing?: Pull over shirt    Upper body assist Assist Level: Contact Guard/Touching assist    Lower Body Dressing/Undressing Lower body dressing      What is the patient wearing?: Pants,Incontinence brief     Lower body assist Assist for lower body dressing: Total Assistance - Patient < 25%     Toileting Toileting    Toileting assist Assist for toileting: Moderate Assistance - Patient 50 - 74%     Transfers Chair/bed transfer  Transfers assist     Chair/bed transfer assist level: Maximal Assistance - Patient 25 - 49%  Locomotion Ambulation   Ambulation assist      Assist level: Maximal Assistance - Patient 25 - 49% Assistive device: Walker-Eva Max distance: 20   Walk 10 feet activity   Assist  Walk 10 feet activity did not occur: Safety/medical concerns  Assist level: Maximal Assistance - Patient 25 - 49% Assistive device: Walker-Eva   Walk 50 feet activity   Assist Walk 50 feet with 2 turns activity did not occur: Safety/medical concerns         Walk 150 feet activity   Assist Walk 150 feet activity did not occur: Safety/medical  concerns         Walk 10 feet on uneven surface  activity   Assist Walk 10 feet on uneven surfaces activity did not occur: Safety/medical concerns         Wheelchair     Assist Will patient use wheelchair at discharge?: Yes Type of Wheelchair: Manual    Wheelchair assist level: Minimal Assistance - Patient > 75% Max wheelchair distance: 90    Wheelchair 50 feet with 2 turns activity    Assist    Wheelchair 50 feet with 2 turns activity did not occur: Safety/medical concerns   Assist Level: Minimal Assistance - Patient > 75%   Wheelchair 150 feet activity     Assist  Wheelchair 150 feet activity did not occur: Safety/medical concerns       Blood pressure (!) 114/56, pulse 81, temperature 98 F (36.7 C), temperature source Oral, resp. rate 16, weight 64.6 kg, SpO2 95 %.  Medical Problem List and Plan: 1.  Decreased functional mobility secondary to right superior and inferior pubic rami fracture nondisplaced after a fall.  Weightbearing as tolerated.  Follow-up Dr. Mardelle Matte             -patient may  shower             -ELOS/Goals: 10-14 days- supervision to [place] 2.  Antithrombotics: -DVT/anticoagulation: Chronic Eliquis             -antiplatelet therapy: N/A 3. Pain Management: Neurontin 100 mg nightly as needed, Lidoderm patch as directed oxycodone and Zanaflex as needed- will add tramadol 50 mg q6 hours prn since pt says Percocet wears off too fast.   1/5- will add lidoderm patches 2 patches 12 hrs on;12 hrs off- 8am to 8pm  1/6- pain is basically controlled per pt- con't regimen  1/7- HA and overall pain controlled- con't regimen 4. Mood: Provide emotional support             -antipsychotic agents: N/A 5. Neuropsych: This patient is? capable of making decisions on her own behalf. 6. Skin/Wound Care: Routine skin checks 7. Fluids/Electrolytes/Nutrition: Routine in and outs with follow-up chemistries 8.  Chronic atrial fibrillation.  Continue Eliquis.   Cardiac rate controlled.  Lopressor 12.5 mg twice daily  1/6 is tired, but no more than normal- rate controlled con't regimen 9.  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  Received CIR 10/19/2016 to 11/01/2016.  Patient used a rolling walker prior to admission. 10.  Diabetes mellitus.  Hemoglobin A1c 6.4.  Metformin resumed 500 mg twice daily  1/5- restarted metformin and stopped insulin this AM- didn't give it-   1/6- BGs 99-128 in last 24 hours- doing great- con't regimen  1/7- BGs 102-153- con't regimen 11.  Hyperlipidemia.  Pravachol 12.  GERD.  Protonix  13.  Constipation.  Colace 100 mg twice daily, MiraLAX as needed- also senokot 1 tab BID- if doesn't work, will do sorbitol tomorrow  1/5- will give sorbitol this evening  1/6- had multiple BMs last night- con't regimen 14. Urinary frequency- will give purewick at night.  1/5- working well- con't purewick, but only at night.   1/6- didn't use purewick last night because frequent stools. But will nightly 15. Chest pain  1/7- gave NTG x2- asked nursing to give 1 more if possible- EKG is OK upon my reading of it- just shows Afib- Troponin done- had been ~4 a few days ago- is now 6- feeling much better with NTG- will monitor closely- if gets worse, will call Cards again.       LOS: 3 days A FACE TO FACE EVALUATION WAS PERFORMED  Ana Liaw 10/23/2020, 10:20 AM

## 2020-10-24 DIAGNOSIS — S32810S Multiple fractures of pelvis with stable disruption of pelvic ring, sequela: Secondary | ICD-10-CM

## 2020-10-24 LAB — GLUCOSE, CAPILLARY
Glucose-Capillary: 127 mg/dL — ABNORMAL HIGH (ref 70–99)
Glucose-Capillary: 106 mg/dL — ABNORMAL HIGH (ref 70–99)
Glucose-Capillary: 94 mg/dL (ref 70–99)
Glucose-Capillary: 104 mg/dL — ABNORMAL HIGH (ref 70–99)

## 2020-10-24 NOTE — Progress Notes (Signed)
Patient reporting achy pain in legs and rates at number 6. Patient also reporting "pressure in chest" when using interpreter service. Tramadol 50mg  given and nitroglycerin given x1 dose. Patient reports relief with this.

## 2020-10-24 NOTE — Progress Notes (Signed)
[facility] PHYSICAL MEDICINE & REHABILITATION PROGRESS NOTE   Subjective/Complaints:  No CP overnight or this AM. Feeling well. Nurse reported no problems  ROS: limited due to language/communication    Objective:   No results found. No results for input(s): WBC, HGB, HCT, PLT in the last 72 hours. No results for input(s): NA, K, CL, CO2, GLUCOSE, BUN, CREATININE, CALCIUM in the last 72 hours.  Intake/Output Summary (Last 24 hours) at 10/24/2020 0841 Last data filed at 10/24/2020 0810 Gross per 24 hour  Intake 680 ml  Output 600 ml  Net 80 ml        Physical Exam: Vital Signs Blood pressure 95/68, pulse 83, temperature 98.5 F (36.9 C), resp. rate 16, weight 64.6 kg, SpO2 97 %.  Constitutional: No distress . Vital signs reviewed. HEENT: EOMI, oral membranes moist Neck: supple Cardiovascular: RRR without murmur. No JVD    Respiratory/Chest: CTA Bilaterally without wheezes or rales. Normal effort    GI/Abdomen: BS +, non-tender, non-distended Ext: no clubbing, cyanosis, or edema Psych: pleasant and cooperative Musculoskeletal: TTP over R groin/R hip- no change- also TTP over inner thighs- R>L and over chest- sternum    Comments: UEs 5-/5 in RUE- hard to get pt to participate, but appears 5-/5 LUE- 4/5 in LUE in same muscles tested RLE- HF 1/5? Pain; KE 2-/5, pain?, DF and PF at least 4/5, but limited by pain as well LLE- HF 2/5? Pain and weak; KE 3-/5, DF and PF 3-/5  Skin: bruising- purple/yellow on R>L inner thighs- no hematoma identified- mild swelling associated-stable Neurological: Ox2, follows all simple commands  Assessment/Plan: 1. Functional deficits which require 3+ hours per day of interdisciplinary therapy in a comprehensive inpatient rehab setting.  Physiatrist is providing close team supervision and 24 hour management of active medical problems listed below.  Physiatrist and rehab team continue to assess barriers to discharge/monitor patient progress toward  functional and medical goals  Care Tool:  Bathing    Body parts bathed by patient: Right arm,Left arm,Chest,Abdomen,Front perineal area,Right upper leg,Left upper leg,Face   Body parts bathed by helper: Buttocks,Right lower leg,Left lower leg     Bathing assist Assist Level: Moderate Assistance - Patient 50 - 74%     Upper Body Dressing/Undressing Upper body dressing   What is the patient wearing?: Pull over shirt    Upper body assist Assist Level: Contact Guard/Touching assist    Lower Body Dressing/Undressing Lower body dressing      What is the patient wearing?: Pants,Incontinence brief     Lower body assist Assist for lower body dressing: Maximal Assistance - Patient 25 - 49%     Toileting Toileting    Toileting assist Assist for toileting: Moderate Assistance - Patient 50 - 74%     Transfers Chair/bed transfer  Transfers assist     Chair/bed transfer assist level: Moderate Assistance - Patient 50 - 74%     Locomotion Ambulation   Ambulation assist      Assist level: Maximal Assistance - Patient 25 - 49% Assistive device: Walker-rolling Max distance: 18   Walk 10 feet activity   Assist  Walk 10 feet activity did not occur: Safety/medical concerns  Assist level: Maximal Assistance - Patient 25 - 49% Assistive device: Walker-rolling   Walk 50 feet activity   Assist Walk 50 feet with 2 turns activity did not occur: Safety/medical concerns         Walk 150 feet activity   Assist Walk 150 feet activity did  not occur: Safety/medical concerns         Walk 10 feet on uneven surface  activity   Assist Walk 10 feet on uneven surfaces activity did not occur: Safety/medical concerns         Wheelchair     Assist Will patient use wheelchair at discharge?: Yes Type of Wheelchair: Manual    Wheelchair assist level: Minimal Assistance - Patient > 75% Max wheelchair distance: 90    Wheelchair 50 feet with 2 turns  activity    Assist    Wheelchair 50 feet with 2 turns activity did not occur: Safety/medical concerns   Assist Level: Minimal Assistance - Patient > 75%   Wheelchair 150 feet activity     Assist  Wheelchair 150 feet activity did not occur: Safety/medical concerns       Blood pressure 95/68, pulse 83, temperature 98.5 F (36.9 C), resp. rate 16, weight 64.6 kg, SpO2 97 %.  Medical Problem List and Plan: 1.  Decreased functional mobility secondary to right superior and inferior pubic rami fracture nondisplaced after a fall.  Weightbearing as tolerated.  Follow-up Dr. Dion Saucier             -patient may  shower             -ELOS/Goals: 10-14 days- supervision to [place] 2.  Antithrombotics: -DVT/anticoagulation: Chronic Eliquis             -antiplatelet therapy: N/A 3. Pain Management: Neurontin 100 mg nightly as needed, Lidoderm patch as directed oxycodone and Zanaflex as needed- will add tramadol 50 mg q6 hours prn since pt says Percocet wears off too fast.   1/5- will add lidoderm patches 2 patches 12 hrs on;12 hrs off- 8am to 8pm  1/6- pain is basically controlled per pt- con't regimen  1/8- HA and overall pain controlled- con't regimen 4. Mood: Provide emotional support             -antipsychotic agents: N/A 5. Neuropsych: This patient is? capable of making decisions on her own behalf. 6. Skin/Wound Care: Routine skin checks 7. Fluids/Electrolytes/Nutrition: Routine in and outs with follow-up chemistries 8.  Chronic atrial fibrillation.  Continue Eliquis.  Cardiac rate controlled.  Lopressor 12.5 mg twice daily  1/6 is tired, but no more than normal- rate controlled con't regimen 9.  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  Received CIR 10/19/2016 to 11/01/2016.  Patient used a rolling walker prior to admission. 10.  Diabetes mellitus.  Hemoglobin A1c 6.4.  Metformin resumed 500  mg twice daily  1/5- restarted metformin and stopped insulin this AM- didn't give it-   1/6- BGs 99-128 in last 24 hours- doing great- con't regimen  1/7-8- BGs controlled 11.  Hyperlipidemia.  Pravachol 12.  GERD.  Protonix 13.  Constipation.  Colace 100 mg twice daily, MiraLAX as needed- also senokot 1 tab BID- if doesn't work, will do sorbitol tomorrow  1/5- will give sorbitol this evening  1/6- had multiple BMs last night- con't regimen 14. Urinary frequency- will give purewick at night.  1/5- working well- con't purewick, but only at night.   1/6- didn't use purewick last night because frequent stools. But will nightly 15. Chest pain  1/7- gave NTG x2- asked nursing to give 1 more if possible- EKG is OK upon my reading of it- just shows Afib- Troponin done- had been ~4 a  few days ago- is now 6- feeling much better with NTG- will monitor closely- if gets worse, will call Cards again.      1/8 no sx today   LOS: 4 days A FACE TO FACE EVALUATION WAS PERFORMED  Ranelle Oyster 10/24/2020, 8:41 AM

## 2020-10-24 NOTE — Plan of Care (Signed)
  Problem: Consults Goal: RH GENERAL PATIENT EDUCATION Description: See Patient Education module for education specifics. Outcome: Progressing   Problem: RH BOWEL ELIMINATION Goal: RH STG MANAGE BOWEL WITH ASSISTANCE Description: STG Manage Bowel with min Assistance. Outcome: Progressing Goal: RH STG MANAGE BOWEL W/MEDICATION W/ASSISTANCE Description: STG Manage Bowel with Medication with min Assistance. Outcome: Progressing   Problem: RH BLADDER ELIMINATION Goal: RH STG MANAGE BLADDER WITH ASSISTANCE Description: STG Manage Bladder With min Assistance Outcome: Progressing   Problem: RH SKIN INTEGRITY Goal: RH STG SKIN FREE OF INFECTION/BREAKDOWN Description: Skin will be free of infection/breakdown with min assist Outcome: Progressing Goal: RH STG MAINTAIN SKIN INTEGRITY WITH ASSISTANCE Description: STG Maintain Skin Integrity With min Assistance. Outcome: Progressing   Problem: RH SAFETY Goal: RH STG ADHERE TO SAFETY PRECAUTIONS W/ASSISTANCE/DEVICE Description: STG Adhere to Safety Precautions With min Assistance/Device. Outcome: Progressing   Problem: RH PAIN MANAGEMENT Goal: RH STG PAIN MANAGED AT OR BELOW PT'S PAIN GOAL Description: Pain will be managed at 3 out of 10 on pain scale with min assist Outcome: Progressing   Problem: RH KNOWLEDGE DEFICIT GENERAL Goal: RH STG INCREASE KNOWLEDGE OF SELF CARE AFTER HOSPITALIZATION Outcome: Progressing

## 2020-10-25 ENCOUNTER — Inpatient Hospital Stay (HOSPITAL_COMMUNITY): Payer: Medicare Other | Admitting: Occupational Therapy

## 2020-10-25 ENCOUNTER — Inpatient Hospital Stay (HOSPITAL_COMMUNITY): Payer: Medicare Other | Admitting: Physical Therapy

## 2020-10-25 DIAGNOSIS — I482 Chronic atrial fibrillation, unspecified: Secondary | ICD-10-CM

## 2020-10-25 DIAGNOSIS — I208 Other forms of angina pectoris: Secondary | ICD-10-CM

## 2020-10-25 LAB — GLUCOSE, CAPILLARY
Glucose-Capillary: 72 mg/dL (ref 70–99)
Glucose-Capillary: 106 mg/dL — ABNORMAL HIGH (ref 70–99)
Glucose-Capillary: 131 mg/dL — ABNORMAL HIGH (ref 70–99)

## 2020-10-25 MED ORDER — METOPROLOL TARTRATE 25 MG PO TABS
25.0000 mg | ORAL_TABLET | Freq: Two times a day (BID) | ORAL | Status: DC
  Administered 2020-10-25 – 2020-11-09 (×28): 25 mg via ORAL
  Filled 2020-10-25 (×30): qty 1

## 2020-10-25 MED ORDER — METOPROLOL TARTRATE 12.5 MG HALF TABLET
12.5000 mg | ORAL_TABLET | Freq: Once | ORAL | Status: AC
  Administered 2020-10-25: 12.5 mg via ORAL
  Filled 2020-10-25: qty 1

## 2020-10-25 NOTE — Progress Notes (Signed)
Physical Therapy Session Note  Patient Details  Name: [name] MRN: [id number] Date of Birth: 09-18-1941  Today's Date: 10/25/2020 PT Individual Time: 1030-1125; 1300-1400 PT Individual Time Calculation (min): 55 min and 60 min  Short Term Goals: Week 1:  PT Short Term Goal 1 (Week 1): Patient to perform bed mobility with min to mod A. PT Short Term Goal 2 (Week 1): Patient to perform sit to stand with min to mod A. PT Short Term Goal 3 (Week 1): Patient to ambulate 33' with RW and min A. PT Short Term Goal 4 (Week 1): Patient to perform standing activity with 1 UE support and min A for improved balance.  Skilled Therapeutic Interventions/Progress Updates:    Session 1: Pt received seated in recliner in room, agreeable to PT session. Pt reports pain in BLE at rest and onset of chest pain with mobility. Nursing and MD aware of pt's complaints of chest pain. Per nursing pt premedicated for pain prior to start of therapy session. Pt does have increase in BLE pain R>L with mobility limiting tolerance for standing and gait training. Utilized ice pack to R hip region at end of session for pain management. Seated BP 110/87, HR 83 at rest. Sit to stand with max A to RW. Attempt to perform stand pivot transfer to w/c, pt unable to lift LE to take steps due to pain. Sit to stand with mod A to stedy. Stedy transfer to w/c. Manual w/c propulsion x 25 ft with use of BUE at min A level, pt reports increase in chest pain with w/c mobility. Sit to stand in // bars with mod A. Pt unable to take steps in // bars due to pain. Sit to stand with mod A to [employer]. Pt able to take a few steps in [place] walker with mod A for balance and increased time needed to advance LE. Pt only tolerates about 5 steps x 2 reps with seated rest break in between. Seated BLE strengthening therex: marches, LAQ, heel/toe raises, hip add squeeze x 10 reps each with cues for slow and controlled movements. Pt requests to return to recliner at end  of session. Mod A for sit to stand for stedy transfer back to recliner from w/c. Pt left seated in recliner in room with needs in reach, chair alarm in place, ice pack to R hip/pelvis at end of session. Utilized video interpreter services throughout session.  Session 2: Pt received seated in recliner in room, reports urge to use the bathroom. Stedy transfer to elevated BSC over toilet, min to mod A to stand to stedy. Pt requires assist for clothing management and pericare following urination. Stedy transfer to w/c with mod A to stand to stedy. Dependent transport via w/c to/from therapy gym for time conservation. Pt reports increase in LE pain this PM as well as fatigue. Per nursing pt received pain medication prior to start of therapy session. Stedy transfer to mat table with mod A to stand. Sit to stand x 5 reps from low mat table to stedy initially with mod A progressing to min A with increase in repetitions. Pt reports pain in LE too great to continue with standing activities, agreeable to perform UB strengthening. Seated UB strengthening therex with use of 2-3# dowel rods: bicep curls, chest press, OH lift, "stirring" L/R, 2 x 10 reps each. At end of session pt reports urge to urinate again, similar transfer method as noted above. Pt returned to recliner at end of session  via stedy. Pt left seated in recliner in room with needs in reach, chair alarm in place, ice pack to R hip and pelvic region. Use of video interpreter services throughout session.  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

## 2020-10-25 NOTE — Plan of Care (Signed)
  Problem: Consults Goal: RH GENERAL PATIENT EDUCATION Description: See Patient Education module for education specifics. Outcome: Progressing   Problem: RH BOWEL ELIMINATION Goal: RH STG MANAGE BOWEL WITH ASSISTANCE Description: STG Manage Bowel with min Assistance. Outcome: Progressing Goal: RH STG MANAGE BOWEL W/MEDICATION W/ASSISTANCE Description: STG Manage Bowel with Medication with min Assistance. Outcome: Progressing   Problem: RH BLADDER ELIMINATION Goal: RH STG MANAGE BLADDER WITH ASSISTANCE Description: STG Manage Bladder With min Assistance Outcome: Progressing   Problem: RH SKIN INTEGRITY Goal: RH STG SKIN FREE OF INFECTION/BREAKDOWN Description: Skin will be free of infection/breakdown with min assist Outcome: Progressing Goal: RH STG MAINTAIN SKIN INTEGRITY WITH ASSISTANCE Description: STG Maintain Skin Integrity With min Assistance. Outcome: Progressing   Problem: RH SAFETY Goal: RH STG ADHERE TO SAFETY PRECAUTIONS W/ASSISTANCE/DEVICE Description: STG Adhere to Safety Precautions With min Assistance/Device. Outcome: Progressing   Problem: RH PAIN MANAGEMENT Goal: RH STG PAIN MANAGED AT OR BELOW PT'S PAIN GOAL Description: Pain will be managed at 3 out of 10 on pain scale with min assist Outcome: Progressing   Problem: RH KNOWLEDGE DEFICIT GENERAL Goal: RH STG INCREASE KNOWLEDGE OF SELF CARE AFTER HOSPITALIZATION Outcome: Progressing   

## 2020-10-25 NOTE — Progress Notes (Signed)
Occupational Therapy Session Note  Patient Details  Name: [name] MRN: [id number] Date of Birth: 02/12/1941  Today's Date: 10/25/2020 OT Individual Time: 6834-1962 OT Individual Time Calculation (min): 57 min    Short Term Goals: Week 1:  OT Short Term Goal 1 (Week 1): pt will perform sit to stand with Min A in prep for LB ADL OT Short Term Goal 2 (Week 1): Pt will transfer with Mod A to toilet with LRAD OT Short Term Goal 3 (Week 1): Pt will perform clothing management for toilet tasks with no more than Mod A OT Short Term Goal 4 (Week 1): Pt will perform LB bathing with AE PRN with Min A  Skilled Therapeutic Interventions/Progress Updates:    Patient in bathroom with nurse tech utilizing stedy to get onto the toilet.  She was continent of bowel and bladder, requires max A for hygiene and mod A for pants up in stance on stedy surface.  stedy to w/c.  She completed hand hygiene, oral care and grooming tasks w/c level at sink with set up.  She declined bathing at this time.  She agreed to change clothing - OH shirt with set up, incontinence brief and pants with mod A.  Donned shoes max A.  SPT with RW to recliner with min A and increased time.  Completed seated trunk, arm and leg AROM / light conditioning activities with fair tolerance.  She remained seated in recliner at close of session, seat alarm set and callbell/tray table in reach.    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

## 2020-10-25 NOTE — Progress Notes (Signed)
[place] PHYSICAL MEDICINE & REHABILITATION PROGRESS NOTE   Subjective/Complaints:  Pt indicates chest discomfort this morning and mild shortness of breath. Just came back from going to bathroom. Taking nitroglycerin with relief. Having pain in legs too  ROS: Limited due to language  Objective:   No results found. No results for input(s): WBC, HGB, HCT, PLT in the last 72 hours. No results for input(s): NA, K, CL, CO2, GLUCOSE, BUN, CREATININE, CALCIUM in the last 72 hours.  Intake/Output Summary (Last 24 hours) at 10/25/2020 0815 Last data filed at 10/25/2020 0813 Gross per 24 hour  Intake 840 ml  Output 300 ml  Net 540 ml        Physical Exam: Vital Signs Blood pressure 129/67, pulse 100, temperature 98.7 F (37.1 C), resp. rate 20, weight 64.6 kg, SpO2 98 %.  Constitutional: No distress . Vital signs reviewed. HEENT: EOMI, oral membranes moist Neck: supple Cardiovascular: IRR without murmur. No JVD    Respiratory/Chest: CTA Bilaterally without wheezes or rales. Normal effort    GI/Abdomen: BS +, non-tender, non-distended Ext: no clubbing, cyanosis, or edema Psych: appears anxious Musculoskeletal: TTP over R groin/R hip- no change- also TTP over inner thighs- R>L and over chest- sternum    Comments: UEs 5-/5 in RUE- hard to get pt to participate, but appears 5-/5 LUE- 4/5 in LUE in same muscles tested RLE- HF 1/5? Pain; KE 2-/5, pain?, DF and PF at least 4/5, but limited by pain as well LLE- HF 2/5? Pain and weak; KE 3-/5, DF and PF 3-/5  Skin: bruising- purple/yellow on R>L inner thighs- no hematoma identified- mild swelling associated--stable to improved Neurological: Ox2, follows all simple commands  Assessment/Plan: 1. Functional deficits which require 3+ hours per day of interdisciplinary therapy in a comprehensive inpatient rehab setting.  Physiatrist is providing close team supervision and 24 hour management of active medical problems listed  below.  Physiatrist and rehab team continue to assess barriers to discharge/monitor patient progress toward functional and medical goals  Care Tool:  Bathing    Body parts bathed by patient: Right arm,Left arm,Chest,Abdomen,Front perineal area,Right upper leg,Left upper leg,Face   Body parts bathed by helper: Buttocks,Right lower leg,Left lower leg     Bathing assist Assist Level: Moderate Assistance - Patient 50 - 74%     Upper Body Dressing/Undressing Upper body dressing   What is the patient wearing?: Pull over shirt    Upper body assist Assist Level: Contact Guard/Touching assist    Lower Body Dressing/Undressing Lower body dressing      What is the patient wearing?: Pants,Incontinence brief     Lower body assist Assist for lower body dressing: Maximal Assistance - Patient 25 - 49%     Toileting Toileting    Toileting assist Assist for toileting: Moderate Assistance - Patient 50 - 74%     Transfers Chair/bed transfer  Transfers assist     Chair/bed transfer assist level: Moderate Assistance - Patient 50 - 74%     Locomotion Ambulation   Ambulation assist      Assist level: Maximal Assistance - Patient 25 - 49% Assistive device: Walker-rolling Max distance: 18   Walk 10 feet activity   Assist  Walk 10 feet activity did not occur: Safety/medical concerns  Assist level: Maximal Assistance - Patient 25 - 49% Assistive device: Walker-rolling   Walk 50 feet activity   Assist Walk 50 feet with 2 turns activity did not occur: Safety/medical concerns  Walk 150 feet activity   Assist Walk 150 feet activity did not occur: Safety/medical concerns         Walk 10 feet on uneven surface  activity   Assist Walk 10 feet on uneven surfaces activity did not occur: Safety/medical concerns         Wheelchair     Assist Will patient use wheelchair at discharge?: Yes Type of Wheelchair: Manual    Wheelchair assist level:  Minimal Assistance - Patient > 75% Max wheelchair distance: 90    Wheelchair 50 feet with 2 turns activity    Assist    Wheelchair 50 feet with 2 turns activity did not occur: Safety/medical concerns   Assist Level: Minimal Assistance - Patient > 75%   Wheelchair 150 feet activity     Assist  Wheelchair 150 feet activity did not occur: Safety/medical concerns       Blood pressure 129/67, pulse 100, temperature 98.7 F (37.1 C), resp. rate 20, weight 64.6 kg, SpO2 98 %.  Medical Problem List and Plan: 1.  Decreased functional mobility secondary to right superior and inferior pubic rami fracture nondisplaced after a fall.  Weightbearing as tolerated.  Follow-up Dr. Dion Saucier             -patient may  shower             -ELOS/Goals: 10-14 days- supervision to CGA 2.  Antithrombotics: -DVT/anticoagulation: Chronic Eliquis             -antiplatelet therapy: N/A 3. Pain Management: Neurontin 100 mg nightly as needed, Lidoderm patch as directed oxycodone and Zanaflex as needed- will add tramadol 50 mg q6 hours prn since pt says Percocet wears off too fast.   1/5- will add lidoderm patches 2 patches 12 hrs on;12 hrs off- 8am to 8pm  1/6- pain is basically controlled per pt- con't regimen  1/8-9- HA and overall pain controlled- con't regimen 4. Mood: Provide emotional support             -antipsychotic agents: N/A 5. Neuropsych: This patient is? capable of making decisions on her own behalf. 6. Skin/Wound Care: Routine skin checks 7. Fluids/Electrolytes/Nutrition: Routine in and outs with follow-up chemistries 8.  Chronic atrial fibrillation.  Continue Eliquis.  Cardiac rate controlled.  Lopressor 12.5 mg twice daily  1/9 rate up a little this morning, has frequently been close to 100   -will increase lopressor to 25mg  bid which should also help with CP 9.  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  Received CIR 10/19/2016 to 11/01/2016.  Patient used a rolling walker prior to admission. 10.  Diabetes mellitus.  Hemoglobin A1c 6.4.  Metformin resumed 500 mg twice daily  1/5- restarted metformin and stopped insulin this AM- didn't give it-   1/6- BGs 99-128 in last 24 hours- doing great- con't regimen  1/7-8- BGs controlled 11.  Hyperlipidemia.  Pravachol 12.  GERD.  Protonix 13.  Constipation.  Colace 100 mg twice daily, MiraLAX as needed- also senokot 1 tab BID- if doesn't work, will do sorbitol tomorrow  1/5- will give sorbitol this evening  1/6- had multiple BMs last night- con't regimen 14. Urinary frequency- will give purewick at night.  1/5- working well- con't purewick, but only at night.   1/6- didn't use purewick last night because frequent stools. But will nightly 15. Chest pain  1/7- gave  NTG x2- asked nursing to give 1 more if possible- EKG is OK upon my reading of it- just shows Afib- Troponin done- had been ~4 a few days ago- is now 6- feeling much better with NTG- will monitor closely- if gets worse, will call Cards again.      1/9 having intermittent cp relieved by nitro.    -increase metoprolol as above   -continue prn sl nitro   -will check another EKG    -question anxiety component also    -will add small dose prn xanax   LOS: 5 days A FACE TO FACE EVALUATION WAS PERFORMED  Ranelle Oyster 10/25/2020, 8:15 AM

## 2020-10-26 ENCOUNTER — Inpatient Hospital Stay (HOSPITAL_COMMUNITY): Payer: Medicare Other

## 2020-10-26 ENCOUNTER — Inpatient Hospital Stay (HOSPITAL_COMMUNITY): Payer: Medicare Other | Admitting: Occupational Therapy

## 2020-10-26 DIAGNOSIS — R072 Precordial pain: Secondary | ICD-10-CM

## 2020-10-26 LAB — GLUCOSE, CAPILLARY
Glucose-Capillary: 129 mg/dL — ABNORMAL HIGH (ref 70–99)
Glucose-Capillary: 77 mg/dL (ref 70–99)
Glucose-Capillary: 106 mg/dL — ABNORMAL HIGH (ref 70–99)
Glucose-Capillary: 102 mg/dL — ABNORMAL HIGH (ref 70–99)

## 2020-10-26 LAB — CBC
HCT: 31.9 % — ABNORMAL LOW (ref 36.0–46.0)
Hemoglobin: 10.3 g/dL — ABNORMAL LOW (ref 12.0–15.0)
MCH: 32.3 pg (ref 26.0–34.0)
MCHC: 32.3 g/dL (ref 30.0–36.0)
MCV: 100 fL (ref 80.0–100.0)
Platelets: 403 10*3/uL — ABNORMAL HIGH (ref 150–400)
RBC: 3.19 MIL/uL — ABNORMAL LOW (ref 3.87–5.11)
RDW: 12 % (ref 11.5–15.5)
WBC: 6.6 10*3/uL (ref 4.0–10.5)
nRBC: 0 % (ref 0.0–0.2)

## 2020-10-26 LAB — BASIC METABOLIC PANEL
Anion gap: 12 (ref 5–15)
BUN: 13 mg/dL (ref 8–23)
CO2: 25 mmol/L (ref 22–32)
Calcium: 9 mg/dL (ref 8.9–10.3)
Chloride: 101 mmol/L (ref 98–111)
Creatinine, Ser: 0.66 mg/dL (ref 0.44–1.00)
GFR, Estimated: >60 mL/min (ref >60)
Glucose, Bld: 151 mg/dL — ABNORMAL HIGH (ref 70–99)
Potassium: 3.8 mmol/L (ref 3.5–5.1)
Sodium: 138 mmol/L (ref 135–145)

## 2020-10-26 LAB — TROPONIN I (HIGH SENSITIVITY)
Troponin I (High Sensitivity): 6 ng/L (ref <18)
Troponin I (High Sensitivity): 5 ng/L (ref <18)

## 2020-10-26 NOTE — Progress Notes (Signed)
Occupational Therapy Session Note  Patient Details  Name: [name] MRN: [id number] Date of Birth: 10-Nov-1940  Today's Date: 10/26/2020 OT Individual Time: 6213-0865 and 7846-9629 OT Individual Time Calculation (min): 62 min and 60 min Missed 10 mins of OT d/t meeting with Cardiology   Short Term Goals: Week 1:  OT Short Term Goal 1 (Week 1): pt will perform sit to stand with Min A in prep for LB ADL OT Short Term Goal 2 (Week 1): Pt will transfer with Mod A to toilet with LRAD OT Short Term Goal 3 (Week 1): Pt will perform clothing management for toilet tasks with no more than Mod A OT Short Term Goal 4 (Week 1): Pt will perform LB bathing with AE PRN with Min A   Skilled Therapeutic Interventions/Progress Updates:    Session 1: Pt greeted at time of session sitting up in recliner agreeable to OT session, needing to use the bathroom. D/t urgency, Stedy transfer recliner > toilet with Min A for sit to stand from recliner to [place]. Note that pt did not fully empty bladder and urinated x2 on commode, first time requesting assist for perihygiene and second able to complete hygiene in front with Min A for standing balance at RW. Brief change at this time Max A to don, pt able to help pull over hips some in standing. Mod A for pulling up pants fully. Stand pivot toilet > w/c with RW Min A overall with extended time d/t difficulty weight shifting onto RLE to move LLE. Once in wheelchair, set up at sink and performed oral hygiene set up and washed face in same manner. Transported to gym dependent for time management, and performed 3 rounds of standing at RW while performing graded block activity to copy example made by therapist, mod cues required for harder designs for problem solving, standing for times of: 1:32, 1:43, 1:50. Pt assisted with partially self propelling back to room short distance to improve problem solving how to turn. Stand pivot back to recliner Min/Mod A with extended time and physical  assist to weight shift. Note daughter present throughout and provided interpretation. Set up with alarm on call bell in reach.   Session 2: Pt greeted at time of session sitting up in recliner meeting with Cardiology (for recent reports of chest pain), missed 10 minutes at beginning of session meeting with NP. Pt agreeable to OT session after meeting with Cardiology, daughter present throughout as interpreter. Stand pivot recliner > wheelchair > toilet with Min/Mod overall with extended time and use of RW, occassional assist for physically weight shifting to allow movement of LLE. Toilet tasks Mod overall with pt able to perform pericare in standing at RW. D/t small space, stedy transfer toilet > shower bench with Min/Mod to stand at [facility]. Once on shower bench, Min A for bathing for buttocks only with using LHS for feet. Assist to dry off d/t patient was very cold. Donned shirt Min A to fully pull down in the back and Stedy transfer d/t fatigue to recliner. Donned new brief Max A and pants Mod A overall with assist to thread but pt able to assist in standing. Total A for donning TEDs and socks, pt able to assist once at knee level. Assisted with blow drying hair as well for comfort. Up in recliner with alarm on, call bell in reach.   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

## 2020-10-26 NOTE — Progress Notes (Signed)
Patient and daughter present during assessment.  Patient has no c/o pain in the chest.  Informed patient and daughter regarding Dr. Berline Chough wanting me to give Nitro for chest pain.  Patient and daughter refused Nitro at this time.  Patient c/o Epigastric pain rated at 2/10.  RN held Metoprolol per order.  RN to reevaluate pain in a hour.

## 2020-10-26 NOTE — Progress Notes (Signed)
Patient ID: [name], female   DOB: [birth year], 80 y.o.   MRN: [id number] Dan-PA completed daughter's FMLA forms and gave back to her. She will follow up with if more information needed.

## 2020-10-26 NOTE — Consult Note (Addendum)
Cardiology Consultation:   Patient [name]; [id number]; 03-02-1941   Admit date: 10/20/2020 Date of Consult: 10/26/2020  Primary Care Provider: [name], MD Primary Cardiologist: New to [facility]  Patient Profile:   [name] is a 80 y.o. 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 Berline Chough.   History of Present Illness:   [name] is a 80yo F with a hx as stated above presented to [facility] 10/17/20 after sustaining a mechanical fall which resulted in a pubic fracture. She was admitted to internal medicine and orthopedic team was consulted who recommended conservative measures with pain control and weight bearing as tolerated. She worked with PT during her course who then recommended CIR for further therapy.   Per chart review, she has had several episodes of chest pain reported during her hospital course which has been relieved with SL NTG. EKG has shown no acute changes. HsT from today at 6>>5. HPI obtained with the assistance of her daughter who is at bedside given language barrier>> reports chest pain has been occurring for months prior to hospital presentation described as a pressure with squeezing sensations with no associated SOB or diaphoresis. She was previously followed with cardiology at [facility] however this was for her AF. 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 She has no exertional qualities, no association with food ingestion, she denies SOB, LE  edema, or orthopnea symptoms. She is currently chest pain free and resting comfortably. See plan below.   Past Medical History:  Diagnosis Date  . Atrial fibrillation (HCC)   . Chronic back pain    "mid back down into lower back" (08/25/2014)  . Frequent falls   . GERD (gastroesophageal reflux disease)   . Hypertriglyceridemia   . OSA on CPAP   . Osteoarthritis    "knees, hands, back" (08/25/2014)  . Pneumonia ~ 2000 X 1  . Subdural hematoma (HCC) july 2015   S/P fall while on Coumadin  . T12 compression fracture (HCC) 2012  . Type II diabetes mellitus (HCC)     Past Surgical History:  Procedure Laterality Date  . APPENDECTOMY  2012  . CATARACT EXTRACTION W/ INTRAOCULAR LENS  IMPLANT, BILATERAL Bilateral 2000's  . IR GENERIC HISTORICAL  10/15/2016   IR PERCUTANEOUS ART THROMBECTOMY/INFUSION INTRACRANIAL INC DIAG ANGIO 10/15/2016 [name], MD [facility]-INTERV RAD  . IR GENERIC HISTORICAL  12/13/2016   IR RADIOLOGIST EVAL & MGMT 12/13/2016 [facility]INTER[facility]  . RADIOLOGY WITH ANESTHESIA N/A 10/15/2016   Procedure: RADIOLOGY WITH ANESTHESIA;  Surgeon: [name], MD;  Location: [facility] OR;  Service: Radiology;  Laterality: N/A;  . TOTAL ABDOMINAL HYSTERECTOMY  1990     Prior to Admission medications   Medication Sig Start Date End Date Taking? Authorizing Provider  albuterol (PROVENTIL) (2.5 MG/3ML) 0.083% nebulizer solution Take 2.5 mg every 6 (six) hours as needed by nebulization for wheezing or shortness of breath.    [provider]  alum & mag hydroxide-simeth (MAALOX/MYLANTA)  200-200-20 MG/5ML suspension Take 15 mLs by mouth every 4 (four) hours as needed for indigestion or heartburn (chest pain not relieved by NTG/morphine). 10/20/20   Glade Lloyd, MD  apixaban (ELIQUIS) 5 MG TABS tablet Take 5 mg 2 (two) times daily by mouth.    [provider]  cetirizine (ZYRTEC) 10 MG tablet Take 10 mg daily as needed by mouth (seasonal allergies).    [provider]  Cholecalciferol (VITAMIN D) 50 MCG (2000 UT) tablet Take 2,000 Units by mouth daily.    [provider]  fluticasone (FLONASE) 50 MCG/ACT nasal spray Place 1 spray daily as needed into both nostrils (seasonal allergies).  02/18/14   [provider]  gabapentin (NEURONTIN) 250 MG/5ML solution Take 100 mg by mouth at bedtime as needed (neuropathy). 08/28/20   [provider]  hydrOXYzine (ATARAX/VISTARIL) 10 MG tablet Take 10 mg by mouth every 8 (eight) hours as needed for itching.    [provider]  Lidocaine 4 % PTCH Apply 2 patches topically daily as needed (pain).    [provider]  Liniments [facility] ARTHRITIS PAIN RELIEF EX) Apply 2 patches topically daily as needed (pain).    [provider]  metFORMIN (GLUCOPHAGE) 500 MG tablet Take 500 mg by mouth 2 (two) times daily. 11/22/16   [provider]  oxyCODONE-acetaminophen (PERCOCET/ROXICET) 5-325 MG tablet Take 1 tablet by mouth every 4 (four) hours as needed for moderate pain. 10/20/20   Glade Lloyd, MD  pantoprazole (PROTONIX) 40 MG tablet Take 1 tablet (40 mg total) by mouth daily. 10/21/20   Glade Lloyd, MD  polyethylene glycol (MIRALAX / GLYCOLAX) 17 g packet Take 17 g by mouth daily as needed for mild constipation. 10/20/20   Glade Lloyd, MD  pravastatin (PRAVACHOL) 20 MG tablet Take 20 mg by mouth at bedtime. 08/28/20   [provider]  senna-docusate (SENOKOT-S) 8.6-50 MG tablet Take 2 tablets 2 (two) times daily as needed by mouth (while taking narcotic pain medication).    [provider]  tiZANidine (ZANAFLEX) 2 MG tablet Take 1 tablet (2 mg total) by mouth every 8 (eight) hours as needed for muscle spasms. 10/20/20   Glade Lloyd, MD    Inpatient Medications: Scheduled Meds: . apixaban  5 mg Oral BID  . cholecalciferol  2,000 Units Oral Daily  . lidocaine  2 patch Transdermal Q24H  . metFORMIN  500 mg Oral BID WC  . metoprolol tartrate  25 mg  Oral BID  . mirabegron ER  50 mg Oral QHS  . pantoprazole  40 mg Oral Daily  . pravastatin  20 mg Oral QHS  . senna-docusate  1 tablet Oral BID   Continuous Infusions:  PRN Meds: acetaminophen, bisacodyl, butalbital-acetaminophen-caffeine, fluticasone, gabapentin, levalbuterol, lidocaine, nitroGLYCERIN, oxyCODONE-acetaminophen, polyethylene glycol, tiZANidine, traMADol  Allergies:    Allergies  Allergen Reactions  . Lactose Intolerance (Gi) Diarrhea  . Banana Swelling    Hands and feet  . Peanut-Containing Drug Products Itching    Throat itches, no swelling  . Chocolate Itching    Social History:   Social History   Socioeconomic History  . Marital status: Widowed    Spouse name: Not on file  . Number of children: 2  . Years of education: 2  . Highest education level: Not on file  Occupational History    Comment: retired  Tobacco Use  . Smoking status: Passive Smoke Exposure - Never Smoker  . Smokeless tobacco: Never Used  Substance and Sexual Activity  .  Alcohol use: No  . Drug use: No  . Sexual activity: Never  Other Topics Concern  . Not on file  Social History Narrative   Lives w/daughter and grandchildren   caffeine coffee, 1/2 cup daily   Social Determinants of Health   Financial Resource Strain: Not on file  Food Insecurity: Not on file  Transportation Needs: Not on file  Physical Activity: Not on file  Stress: Not on file  Social Connections: Not on file  Intimate Partner Violence: Not on file    Family History:   Family History  Problem Relation Age of Onset  . Diabetes Mellitus II Neg Hx   . Hypertension Neg Hx    Family Status:  Family Status  Relation Name Status  . Mother  Deceased  . Father  Deceased  . Sister  Deceased  . Daughter  Alive  . Brother 1/2 Armed forces training and education officerAlive  . Neg Hx  (Not Specified)    ROS:  Please see the history of present illness.  All other ROS reviewed and negative.     Physical Exam/Data:   Vitals:   10/25/20 1439  10/25/20 1931 10/26/20 0532 10/26/20 0804  BP: 99/64 113/65 (!) 96/56 98/61  Pulse: 75 83 78 92  Resp: 18 16 18 16   Temp: 98.4 F (36.9 C) 98.9 F (37.2 C) 97.8 F (36.6 C)   TempSrc: Oral  Oral   SpO2: 97% 98% 100%   Weight:        Intake/Output Summary (Last 24 hours) at 10/26/2020 1222 Last data filed at 10/26/2020 0705 Gross per 24 hour  Intake 660 ml  Output 700 ml  Net -40 ml   Filed Weights   10/20/20 1611  Weight: 64.6 kg   Body mass index is 29.77 kg/m.   General: Well developed, well nourished, NAD Neck: Negative for carotid bruits. No JVD Lungs:Clear to ausculation bilaterally. No wheezes, rales, or rhonchi. Breathing is unlabored. Cardiovascular: Irregularly irregular. No murmur Abdomen: Soft, non-tender, non-distended. No obvious abdominal masses. Extremities: No edema. Radial pulses 2+ bilaterally Neuro: Alert and oriented. No focal deficits. No facial asymmetry. MAE spontaneously. Psych: Responds to questions appropriately with normal affect.    EKG:  The EKG was personally reviewed and demonstrates:  10/25/20 AF with no acute changes   Relevant CV Studies:  Echocardiogram 10/17/2016:  Study Conclusions   - Left ventricle: The cavity size was normal. There was moderate  concentric hypertrophy. Systolic function was vigorous. The  estimated ejection fraction was in the range of 65% to 70%. Wall  motion was normal; there were no regional wall motion  abnormalities.  - Aortic valve: Valve mobility was trivially restricted.  Transvalvular velocity was increased. There was mild stenosis.  Valve area (VTI): 0.86 cm^2. Valve area (Vmax): 1.05 cm^2. Valve  area (Vmean): 0.89 cm^2.  - Mitral valve: Moderately calcified annulus. There was mild  regurgitation.  - Left atrium: The atrium was moderately dilated.  - Right atrium: The atrium was mildly dilated.  - Pulmonary arteries: Systolic pressure was mildly to moderately  increased. PA peak  pressure: 41 mm Hg (S).   Impressions:   - No cardiac source of embolism was identified, but cannot be ruled  out on the basis of this examination.   Recommendations: Consider transesophageal echocardiography if  clinically indicated.   CT Chest: 05/10/19 CONCLUSION:  1. Unchanged appearance of a well-circumscribed 17 mm ovoid anterior mediastinal mass. 2. Redemonstration of multiple pulmonary nodules which appear overall similar in size. 3.  Radiographic findings consistent with small airways disease with associated respiratory bronchiolitis. 4. Unchanged size of small bilateral indeterminate adrenal nodules. 5. Additional chronic findings as above.   Laboratory Data:  Chemistry Recent Labs  Lab 10/20/20 0453 10/21/20 0543 10/26/20 0547  NA 139 138 138  K 4.3 3.9 3.8  CL 107 102 101  CO2 24 25 25   GLUCOSE 154* 147* 151*  BUN 19 15 13   CREATININE 0.64 0.60 0.66  CALCIUM 8.3* 8.2* 9.0  GFRNONAA >60 >60 >60  ANIONGAP 8 11 12     Total Protein  Date Value Ref Range Status  10/21/2020 5.4 (L) 6.5 - 8.1 g/dL Final   Albumin  Date Value Ref Range Status  10/21/2020 2.9 (L) 3.5 - 5.0 g/dL Final   AST  Date Value Ref Range Status  10/21/2020 12 (L) 15 - 41 U/L Final   ALT  Date Value Ref Range Status  10/21/2020 12 0 - 44 U/L Final   Alkaline Phosphatase  Date Value Ref Range Status  10/21/2020 34 (L) 38 - 126 U/L Final   Total Bilirubin  Date Value Ref Range Status  10/21/2020 1.0 0.3 - 1.2 mg/dL Final   Hematology Recent Labs  Lab 10/20/20 0453 10/21/20 0543 10/26/20 0547  WBC 11.5* 8.6 6.6  RBC 3.17* 3.07* 3.19*  HGB 10.5* 10.1* 10.3*  HCT 32.6* 31.3* 31.9*  MCV 102.8* 102.0* 100.0  MCH 33.1 32.9 32.3  MCHC 32.2 32.3 32.3  RDW 12.3 12.4 12.0  PLT 225 242 403*   Cardiac EnzymesNo results for input(s): TROPONINI in the last 168 hours. No results for input(s): TROPIPOC in the last 168 hours.  BNPNo results for input(s): BNP, PROBNP in the last  168 hours.  DDimer No results for input(s): DDIMER in the last 168 hours. TSH:  Lab Results  Component Value Date   TSH 0.290 (L) 02/13/2018   Lipids: Lab Results  Component Value Date   CHOL 128 10/16/2016   HDL 29 (L) 10/16/2016   LDLCALC 59 10/16/2016   TRIG 198 (H) 10/16/2016   CHOLHDL 4.4 10/16/2016   HgbA1c: Lab Results  Component Value Date   HGBA1C 6.4 (H) 10/18/2020   Radiology/Studies:  No results found.  Assessment and Plan:   1. Chest pain: -[facility] after sustaining a mechanical fall which resulted in a pubic fracture. She was admitted to internal medicine and orthopedic team was consulted who recommended conservative measures with pain control and weight bearing as tolerated. She worked with PT during her course who then recommended CIR for further therapy.  -Per chart review, she has had several episodes of chest pain reported during her hospital course which has been relieved with SL NTG.  -EKG has shown no acute changes.  -HsT from today at 6>>5.  -HPI obtained with the assistance of her daughter who is at bedside given language barrier>> reports chest pain has been occurring for months prior to hospital presentation described as a pressure with squeezing sensations with no associated SOB or diaphoresis.  -[facility] however this was for her AF. 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  -Would recommend further workup with stress testing tomorrow -Keep NPO after MDN  -Has CV risk factors with HTN, HLD, DM2, and prior coronary calcifications on chest imaging  -No ASA in the setting of Eliquis  -Continue  pravastatin, metoprolol   2. Pelvic fracture: -Presented to [facility] after sustaining a mechanical fall with recommendations per ortho for conservative measure -Continue with PT/OT  and pain control   3. Chronic atrial fibrillation: -Previously followed with cardiology at [facility] however has been more recently managed by her PCP -Continue with AC and rate control with metoprolol   4. HLD: -Last LDL,  -Continue statin   5. DM2: -Continue Metformin per primary team  -Last Hb A1C,   6. History of CVA: -Continue statin  For questions or updates, please contact [facility] Please consult [email or web address] for contact info under Cardiology/STEMI.   Signed, Georgie Chard NP-C [facility] Pager: [phone] 10/26/2020 12:22 PM  As above, patient seen and examined.  Briefly she is a 80 year old female with past medical history of permanent atrial fibrillation, diabetes mellitus, hyperlipidemia, obstructive sleep apnea, previous CVA admitted after falling and suffering a right pubic ramus fracture for evaluation of chest pain.  Note patient does not speak [place].  She is from [place] and history is obtained with assistance of her daughter.  Echocardiogram December 2021 at [facility] showed normal LV function, severe biatrial enlargement, mild aortic insufficiency, mild to moderate mitral regurgitation and mild tricuspid regurgitation.  Patient apparently has had intermittent chest pain for several years.  It has increased in frequency recently.  It is substernal radiating towards the neck.  It is described as a pressure.  Not exertional, related to food or pleuritic.  Typically last approximately 30 minutes and resolve spontaneously.  No water brash.  There is some dyspnea but no nausea or diaphoresis.  She has had some of these episodes while in-house and cardiology asked to evaluate. Enzymes show troponin of 6 and 5.  Electrocardiogram shows atrial fibrillation with no ST changes. 1 chest pain-etiology unclear.  No ST changes and enzymes negative.  Question reproduction with palpating the epigastric area.  Patient does have risk factors.  Not a good candidate for cardiac CTA  given permanent atrial fibrillation.  We will arrange a Lexiscan nuclear study for risk stratification. 2 permanent atrial fibrillation-patient's heart rate is controlled.  Continue Toprol at present dose.  Continue apixaban. 3 hyperlipidemia-continue statin. Olga Millers, MD

## 2020-10-26 NOTE — Progress Notes (Signed)
Physical Therapy Session Note  Patient Details  Name: [name] MRN: [id number] Date of Birth: [birth year]  Today's Date: 10/26/2020 PT Individual Time: 0830-0930 PT Individual Time Calculation (min): 60 min   Short Term Goals: Week 1:  PT Short Term Goal 1 (Week 1): Patient to perform bed mobility with min to mod A. PT Short Term Goal 2 (Week 1): Patient to perform sit to stand with min to mod A. PT Short Term Goal 3 (Week 1): Patient to ambulate 5' with RW and min A. PT Short Term Goal 4 (Week 1): Patient to perform standing activity with 1 UE support and min A for improved balance.  Skilled Therapeutic Interventions/Progress Updates:    Patient in recliner with daughter in the room.  Requesting to don pants.  Patient leaning over to attempt to loop legs into pants needing mod A.  Sit to stand to RW to pull up pants with mod A.  Patient stand step to w/c with mod A increased time using RW and assist for weight shifts. Patient assisted in w/c to ortho gym.  Performed standing balance reaching to tap targets on BITS 2 x 45 targets ~2-2.5 minutes each with S to CGA 1 UE supported.  Patient ambulated x 20' with RW and min/mod A increased time.  Requested to sit due to dizziness, measured BP and noted 133/77, HR 85 in sitting and 129/69, HR 78 in standing.  Ambulated to mat x 6' with RW and min/mod A.  Seated on mat for vestibular screen.  Noted slowed and slightly saccadic smooth pursuits, slowed saccades, some difficulty with moving her head to check VOR, intact VOR cancellation and normal head thrust test.  Did R modified sidelying hallpike with A for legs and pt c/o pain R leg in position, but noted no nystagmus.  Did not test L due to c/o pain.  Patient reported had some increased symptoms with head turns to R today (daughter reports was to L yesterday).  Educated pt and daughter on likely central cause of dizziness from prior stroke maybe exacerbated from immobility.  Patient educated in 5 head  turns L to R and performed with mild to moderate dizziness, then performed horizontal and vertical gaze stability with near target.  Performed car transfer to simulated small SUV height with step stool with mod to max A.  Daughter reports difficulty to get to seat in her car due to gap between edge of car door and seat.  Reported will continue to work on it.  Patient assisted out with mod A.  Stand step to w/c with mod A with RW.  Step to toilet in room with mod A using grabbars.  Left on toilet with daughter in the room and NT aware.   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, pelvic pain, [place] - needs translator Restrictions Weight Bearing Restrictions: No RLE Weight Bearing: Weight bearing as tolerated Other Position/Activity Restrictions: WBAT Pain: Pain Assessment Pain Scale: 0-10 Pain Score: 5  Faces Pain Scale: Hurts even more Pain Type: Acute pain Pain Location: Groin Pain Orientation: Right Pain Descriptors / Indicators: Aching;Discomfort;Guarding Pain Frequency: Constant Pain Onset: With Activity Patients Stated Pain Goal: 2 Pain Intervention(s): Repositioned;Rest Multiple Pain Sites: No    Therapy/Group: Individual Therapy  Elray Mcgregor  Sheran Lawless, PT 10/26/2020, 12:10 PM

## 2020-10-26 NOTE — Progress Notes (Signed)
[place] PHYSICAL MEDICINE & REHABILITATION PROGRESS NOTE   Subjective/Complaints:  Pt reports SOB/fatigue/ with chest tightness again - when asked through teleinerpretor- she rates pain 7/10- - hadn't received NTG this AM- got yesterday.   Didn't sleep well overnight due to it and stiff legs.   Called Cards master- asked for troponin and will also order EKG.   Did get percocet at 6am this AM, so can't get again yet this AM.     ROS: limited due to language  Objective:   No results found. Recent Labs    10/26/20 0547  WBC 6.6  HGB 10.3*  HCT 31.9*  PLT 403*   Recent Labs    10/26/20 0547  NA 138  K 3.8  CL 101  CO2 25  GLUCOSE 151*  BUN 13  CREATININE 0.66  CALCIUM 9.0    Intake/Output Summary (Last 24 hours) at 10/26/2020 0840 Last data filed at 10/26/2020 0705 Gross per 24 hour  Intake 460 ml  Output 700 ml  Net -240 ml        Physical Exam: Vital Signs Blood pressure 98/61, pulse 92, temperature 97.8 F (36.6 C), temperature source Oral, resp. rate 16, weight 64.6 kg, SpO2 100 %.  Constitutional: sitting up in bedside chair, 2nd time seen, daughter in room, NAD- used tele-interpretor first time HEENT: EOMI, oral membranes moist Neck: supple Cardiovascular: in afib- rate controlled    Respiratory/Chest: CTA B/L- no W/R/R- good air movement- doesn't appear SOB at rest- sitting in chair- appears comfortable  GI/Abdomen: Soft, NT, ND, (+)BS  Ext: no clubbing, cyanosis, or edema Psych: appears less anxious Musculoskeletal: TTP over R groin/R hip- no change- also TTP over inner thighs- R>L and over chest- sternum    Comments: UEs 5-/5 in RUE- hard to get pt to participate, but appears 5-/5 LUE- 4/5 in LUE in same muscles tested RLE- HF 1/5? Pain; KE 2-/5, pain?, DF and PF at least 4/5, but limited by pain as well LLE- HF 2/5? Pain and weak; KE 3-/5, DF and PF 3-/5  Skin: bruising- purple/yellow on R>L inner thighs- no hematoma identified- mild  swelling associated--stable to improved Neurological: Ox2- follows 1 step simple commands  Assessment/Plan: 1. Functional deficits which require 3+ hours per day of interdisciplinary therapy in a comprehensive inpatient rehab setting.  Physiatrist is providing close team supervision and 24 hour management of active medical problems listed below.  Physiatrist and rehab team continue to assess barriers to discharge/monitor patient progress toward functional and medical goals  Care Tool:  Bathing    Body parts bathed by patient: Right arm,Left arm,Chest,Abdomen,Front perineal area,Right upper leg,Left upper leg,Face   Body parts bathed by helper: Buttocks,Right lower leg,Left lower leg     Bathing assist Assist Level: Moderate Assistance - Patient 50 - 74%     Upper Body Dressing/Undressing Upper body dressing   What is the patient wearing?: Pull over shirt    Upper body assist Assist Level: Contact Guard/Touching assist    Lower Body Dressing/Undressing Lower body dressing      What is the patient wearing?: Pants,Incontinence brief     Lower body assist Assist for lower body dressing: Maximal Assistance - Patient 25 - 49%     Toileting Toileting    Toileting assist Assist for toileting: Moderate Assistance - Patient 50 - 74%     Transfers Chair/bed transfer  Transfers assist     Chair/bed transfer assist level: Dependent - mechanical lift     Locomotion  Ambulation   Ambulation assist      Assist level: Maximal Assistance - Patient 25 - 49% Assistive device: Walker-rolling Max distance: 18   Walk 10 feet activity   Assist  Walk 10 feet activity did not occur: Safety/medical concerns  Assist level: Maximal Assistance - Patient 25 - 49% Assistive device: Walker-rolling   Walk 50 feet activity   Assist Walk 50 feet with 2 turns activity did not occur: Safety/medical concerns         Walk 150 feet activity   Assist Walk 150 feet activity  did not occur: Safety/medical concerns         Walk 10 feet on uneven surface  activity   Assist Walk 10 feet on uneven surfaces activity did not occur: Safety/medical concerns         Wheelchair     Assist Will patient use wheelchair at discharge?: Yes Type of Wheelchair: Manual    Wheelchair assist level: Minimal Assistance - Patient > 75% Max wheelchair distance: 90    Wheelchair 50 feet with 2 turns activity    Assist    Wheelchair 50 feet with 2 turns activity did not occur: Safety/medical concerns   Assist Level: Minimal Assistance - Patient > 75%   Wheelchair 150 feet activity     Assist  Wheelchair 150 feet activity did not occur: Safety/medical concerns       Blood pressure 98/61, pulse 92, temperature 97.8 F (36.6 C), temperature source Oral, resp. rate 16, weight 64.6 kg, SpO2 100 %.  Medical Problem List and Plan: 1.  Decreased functional mobility secondary to right superior and inferior pubic rami fracture nondisplaced after a fall.  Weightbearing as tolerated.  Follow-up Dr. Dion Saucier             -patient may  shower  1/10- explained sore legs and stiffness is normal- take prn pain meds             -ELOS/Goals: 10-14 days- supervision to CGA 2.  Antithrombotics: -DVT/anticoagulation: Chronic Eliquis             -antiplatelet therapy: N/A 3. Pain Management: Neurontin 100 mg nightly as needed, Lidoderm patch as directed oxycodone and Zanaflex as needed- will add tramadol 50 mg q6 hours prn since pt says Percocet wears off too fast.   1/5- will add lidoderm patches 2 patches 12 hrs on;12 hrs off- 8am to 8pm  1/6- pain is basically controlled per pt- con't regimen  1/8-9- HA and overall pain controlled- con't regimen 4. Mood: Provide emotional support             -antipsychotic agents: N/A 5. Neuropsych: This patient is? capable of making decisions on her own behalf. 6. Skin/Wound Care: Routine skin checks 7.  Fluids/Electrolytes/Nutrition: Routine in and outs with follow-up chemistries 8.  Chronic atrial fibrillation.  Continue Eliquis.  Cardiac rate controlled.  Lopressor 12.5 mg twice daily  1/9 rate up a little this morning, has frequently been close to 100   -will increase lopressor to 25mg  bid which should also help with CP  1/10- rate still 97 this AM-monitor  9.  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  Received CIR 10/19/2016 to 11/01/2016.  Patient used a rolling walker prior to admission. 10.  Diabetes mellitus.  Hemoglobin A1c 6.4.  Metformin resumed 500 mg twice daily  1/5- restarted metformin and stopped insulin  this AM- didn't give it-   1/6- BGs 99-128 in last 24 hours- doing great- con't regimen  1/7-8- BGs controlled  1/10- BGs 72-131- cont' regimen 11.  Hyperlipidemia.  Pravachol 12.  GERD.  Protonix 13.  Constipation.  Colace 100 mg twice daily, MiraLAX as needed- also senokot 1 tab BID- if doesn't work, will do sorbitol tomorrow  1/5- will give sorbitol this evening  1/6- had multiple BMs last night- con't regimen 14. Urinary frequency- will give purewick at night.  1/5- working well- con't purewick, but only at night.   1/6- didn't use purewick last night because frequent stools. But will nightly 15. Chest pain  1/7- gave NTG x2- asked nursing to give 1 more if possible- EKG is OK upon my reading of it- just shows Afib- Troponin done- had been ~4 a few days ago- is now 6- feeling much better with NTG- will monitor closely- if gets worse, will call Cards again.      1/9 having intermittent cp relieved by nitro.    -increase metoprolol as above   -continue prn sl nitro   -will check another EKG    -question anxiety component also    -will add small dose prn xanax  1/10- chest pain/tightness 7/10 per pt- also SOB (doesn't look SOB), and fatigued- will order another troponins  and EKG and called Cards to see her, since recurrent.    LOS: 6 days A FACE TO FACE EVALUATION WAS PERFORMED  Deniqua Perry 10/26/2020, 8:40 AM

## 2020-10-26 NOTE — Progress Notes (Signed)
Patient had a good day today.  Patient's daughter present most of the day.  Patient had her therapy and RN gave ordered pain medicine.  Patient c/o chest pain and Dr. Jens Som with Cardiology saw the patient this afternoon.  Patient ate all of her dinner and has no distress at the end of shift.

## 2020-10-27 ENCOUNTER — Inpatient Hospital Stay (HOSPITAL_COMMUNITY): Payer: Medicare Other

## 2020-10-27 ENCOUNTER — Inpatient Hospital Stay (HOSPITAL_COMMUNITY): Payer: Medicare Other | Admitting: Occupational Therapy

## 2020-10-27 DIAGNOSIS — R072 Precordial pain: Secondary | ICD-10-CM

## 2020-10-27 DIAGNOSIS — R079 Chest pain, unspecified: Secondary | ICD-10-CM

## 2020-10-27 LAB — GLUCOSE, CAPILLARY
Glucose-Capillary: 140 mg/dL — ABNORMAL HIGH (ref 70–99)
Glucose-Capillary: 104 mg/dL — ABNORMAL HIGH (ref 70–99)
Glucose-Capillary: 128 mg/dL — ABNORMAL HIGH (ref 70–99)
Glucose-Capillary: 111 mg/dL — ABNORMAL HIGH (ref 70–99)

## 2020-10-27 MED ORDER — REGADENOSON 0.4 MG/5ML IV SOLN
0.4000 mg | Freq: Once | INTRAVENOUS | Status: AC
  Administered 2020-10-27: 0.4 mg via INTRAVENOUS
  Filled 2020-10-27: qty 5

## 2020-10-27 MED ORDER — TECHNETIUM TC 99M TETROFOSMIN IV KIT
10.4000 | PACK | Freq: Once | INTRAVENOUS | Status: AC | PRN
  Administered 2020-10-27: 10.4 via INTRAVENOUS

## 2020-10-27 MED ORDER — TECHNETIUM TC 99M TETROFOSMIN IV KIT
32.6000 | PACK | Freq: Once | INTRAVENOUS | Status: AC | PRN
  Administered 2020-10-27: 32.6 via INTRAVENOUS

## 2020-10-27 MED ORDER — REGADENOSON 0.4 MG/5ML IV SOLN
INTRAVENOUS | Status: AC
  Filled 2020-10-27: qty 5

## 2020-10-27 NOTE — Progress Notes (Signed)
Patient ID: [name], female   DOB: 1941-09-09, 80 y.o.   MRN: [id number]  Spoke with daughter via telephone to inform of team conference goals-supervision overall and target discharge date 1/21. Daughter reports she works that weekend and asked if can stay until 1/24, when she is off. Have reached out to team and MD to see if can justify this request. Will inform daughter when hear back from MD and team.

## 2020-10-27 NOTE — Progress Notes (Signed)
Will inform day shift of patients NPO status and reasoning behind it.

## 2020-10-27 NOTE — Patient Care Conference (Signed)
Inpatient RehabilitationTeam Conference and Plan of Care Update Date: 10/27/2020   Time: 11:30 AM    Patient Name: [name]      Medical Record Number: [id number]  Date of Birth: 08/14/1941 Sex: Female         Room/Bed: 4M02C/4M02C-01 Payor Info: Payor: MEDICARE / Plan: MEDICARE PART A AND B / Product Type: *No Product type* /    Admit Date/Time:  10/20/2020 12:46 PM  Primary Diagnosis:  Pelvic fracture [facility])  Hospital Problems: Principal Problem:   Pelvic fracture [facility]) Active Problems:   Precordial pain    Expected Discharge Date: Expected Discharge Date: 11/06/20  Team Members Present: Physician leading conference: Dr. Genice Rouge Care Coodinator Present: Kennyth Arnold, RN, BSN, CRRN;Becky Dupree, LCSW Nurse Present: Otilio Carpen, RN PT Present: Sheran Lawless, PT OT Present: Roney Mans, OT PPS Coordinator present : Fae Pippin, SLP     Current Status/Progress Goal Weekly Team Focus  Bowel/Bladder   Continent of B/B. LBM 10/25/2020 (Has an order for a purwick at night)  Remain continent  Assess Q shift and PRN   Swallow/Nutrition/ Hydration             ADL's   Min/Mod toilet transfers with RW with extended time and difficulty weight shifting onto RLE, showers with Min A for buttocks able to use LHS, LB dress Max A but plan to introduce reacher, limited by pain in standing/sit to stands  Min for LB bathe/dress, Supervision transfers  sit to stands, ADL transfers, shower bench transfer, standing with unilateral support, LB ADL with AE   Mobility   mod A stand pivot transfers, approaching min A sit to stand, mod A ambulation with RW 20' limited by pain, dizziness; checked out for vestibular, working on habituation/gaze stability; max A car transfer, max A steps x 2 with rails  S overall min A steps  gait, balance, LE strength, habituation, functional transfers   Communication             Safety/Cognition/ Behavioral Observations            Pain   Scale of 0-10, 7  reported  Pain <3  Assess Q shift and PRN   Skin   Skin intact  Maintain skin integrity  Assess Q shift and PRN     Discharge Planning:  HOme with daughter who is working on 24/7. FMLA papers completed and given to daughter yesterday. Daughter is here most days   Team Discussion: Patient doesn't tolerate pain well, and nursing applied Lidocaine patches. Has been in A-fib since before this admission. Having a cardiology stress test today. KUB shows moderate stool. Patient has urgency/frequency Patient on target to meet rehab goals: Min to mod assist for toilet transfers, max assist for lower body ADL's with min assist goals. Mod assist for sit/stand. Patient has difficult time moving right, has difficulty moving head, didn't test left vestibular due to pain. Supervision goals, min assist for stairs.  *See Care Plan and progress notes for long and short-term goals.   Revisions to Treatment Plan:    Teaching Needs: Family education, toilet transfers, medication management  Current Barriers to Discharge: Decreased caregiver support, Home enviroment access/layout, Lack of/limited family support, Weight bearing restrictions, Medication compliance and Behavior  Possible Resolutions to Barriers Continue current medications, provide emotional support to patient and family.     Medical Summary Current Status: Vietnamese; lots of urgency/frequency; B/B continent usually; at Cards imaging right now; skin intact, but has a lot of bruising;  poor appetite  Barriers to Discharge: Behavior;Home enviroment access/layout;Weight;Nutrition means;Medical stability;Decreased family/caregiver support  Barriers to Discharge Comments: home with daughter; will need 24/7 initially; might start remeron for appetite/sleep? Possible Resolutions to [employer]: goals S/min A OT; PT- more difficult with wgt shift to R; 20 ft RW min to mod A; vestibular screen done- did hall pike- previous stroke likely cause;   d/c-1/21   Continued Need for Acute Rehabilitation Level of Care: The patient requires daily medical management by a physician with specialized training in physical medicine and rehabilitation for the following reasons: Direction of a multidisciplinary physical rehabilitation program to maximize functional independence : Yes Medical management of patient stability for increased activity during participation in an intensive rehabilitation regime.: Yes Analysis of laboratory values and/or radiology reports with any subsequent need for medication adjustment and/or medical intervention. : Yes   I attest that I was present, lead the team conference, and concur with the assessment and plan of the team.   Tennis Must 10/27/2020, 3:19 PM

## 2020-10-27 NOTE — Progress Notes (Signed)
Physical Therapy Session Note  Patient Details  Name: [name] MRN: [id number] Date of Birth: [birth year]  Today's Date: 10/27/2020 PT Individual Time:Session1: 1660-6004; Chase Picket: [id number] PT Individual Time Calculation (min): 32 min & 68 min  Short Term Goals: Week 1:  PT Short Term Goal 1 (Week 1): Patient to perform bed mobility with min to mod A. PT Short Term Goal 2 (Week 1): Patient to perform sit to stand with min to mod A. PT Short Term Goal 3 (Week 1): Patient to ambulate 92' with RW and min A. PT Short Term Goal 4 (Week 1): Patient to perform standing activity with 1 UE support and min A for improved balance.  Skilled Therapeutic Interventions/Progress Updates:    Session1:  Patient in supine after NT assisted and she reports pt for procedure.  Asked RN who reports pt scheduled for stress test and IV team to come and insert IV, but okay for bedside PT.  Utilized [employer] interpreter Aram Beecham via ipad in the room throughout session. Patient in supine for therex including AP's, heel slides, SAQ w/ 5 sec hold, hip abduction, adductor squeezes w/ 5 sec hold, and bridging with 3 sec hold.  Supine to sit mod A for scooting hips and assisting to lift trunk.  Patient sit to stand with cues for hand placement and min A.  Patient ambulated with min/mod A x 40' with RW.  Assist for weight shift and moving legs.  IV team arrived so assisted pt to supine with mod A.  Scooting up in bed with bed in trendelenberg and cues for using rails and min A.  Left in supine with HOB elevated and IV team nurse in the room. Missed 28 minutes of skilled PT due to testing.  Session2: Patient in recliner following test finishing lunch after back from stress test.  Session performed with assistance of [employer] video [place] interpreter.  Reports feeling fine, pain still there, but getting better and initially refusing pain medication.  Performed sit to stand with min A. And stepped to w/c with min to mod A with RW.  In w/c  pushed to therapy gym.  Patient sit to stand at stairs with min A and negotiated 8 (3") steps with rails and mod A, max cues for sequence.  Patient reported pain in the leg and too stiff to walk so requested pain medication from RN.  Stand step to Nu Step with min to mod A with RW.  Scooted back with step and min A.  Performed Nu Step x 6 minutes with 3 seated rest breaks at level 1 using UE/LE.  Sit to stand with min A to RW and attempted ambulation about 3 steps, but pt reported still too stiff and painful to walk so placed chair behind her to allow her to sit.  Patient requesting to toilet.  Assisted in w/c to room and pt requesting to use Stedy to use the bathroom.  Performed transfer to toilet total A with Stedy and pt needing min S to stand to stedy from w/c, but mod A from 3:1 over toilet.  Assisted to recliner and left with call bell an needs in reach and chair alarm activated.  Missed 16 minutes of skilled PT due to pain and eating lunch initially.   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, pelvic pain, [place] - needs translator Restrictions Weight Bearing Restrictions: No RLE Weight Bearing: Weight bearing as tolerated Other Position/Activity Restrictions: WBAT General: PT Amount of Missed  Time (min): 28 Minutes PT Missed Treatment Reason: Nursing care (IV team for prep for stress test) Pain: Pain Assessment Pain Scale: Faces Pain Score: 4  Faces Pain Scale: Hurts even more Pain Type: Acute pain Pain Location: Groin Pain Orientation: Right Pain Descriptors / Indicators: Tender;Sore Pain Frequency: Constant Pain Onset: With Activity Patients Stated Pain Goal: 2 Pain Intervention(s): Ambulation/increased activity;Repositioned Multiple Pain Sites: No     Therapy/Group: Individual Therapy  Elray Mcgregor  Sheran Lawless, PT 10/27/2020, 9:09 AM

## 2020-10-27 NOTE — Progress Notes (Signed)
   [facility] [name] presented for a nuclear stress test today.  No immediate complications.  Stress imaging is pending at this time.  Preliminary EKG findings may be listed in the chart, but the stress test result will not be finalized until perfusion imaging is complete.  Georgie Chard, NP-C 10/27/2020, 12:26 PM

## 2020-10-27 NOTE — Progress Notes (Signed)
[facility] PHYSICAL MEDICINE & REHABILITATION PROGRESS NOTE   Subjective/Complaints:  still having Chest pain-off and on- when occurs, it's 7/10, but not having right now, per pt. First said she was, but then admitted wasn't having CP right now.  Right now, just a little tired.  LBM 3-4 days ago- but thinks because hasn't eaten much- no appetite for past few days.   Per tele-interpretor.      ROS: limited due to language  Objective:   No results found. Recent Labs    10/26/20 0547  WBC 6.6  HGB 10.3*  HCT 31.9*  PLT 403*   Recent Labs    10/26/20 0547  NA 138  K 3.8  CL 101  CO2 25  GLUCOSE 151*  BUN 13  CREATININE 0.66  CALCIUM 9.0    Intake/Output Summary (Last 24 hours) at 10/27/2020 0917 Last data filed at 10/27/2020 0640 Gross per 24 hour  Intake 320 ml  Output 650 ml  Net -330 ml        Physical Exam: Vital Signs Blood pressure 131/67, pulse 82, temperature 97.9 F (36.6 C), temperature source Oral, resp. rate 20, weight 64.6 kg, SpO2 98 %.  Constitutional: sitting up in bed working with PT; using tele-interpretor, NAD HEENT: EOMI, oral membranes moist Neck: supple Cardiovascular irregular; rate controlled  Respiratory/Chest: CTA B/L- no W/R/R- good air movement GI/Abdomen: Soft, NT, ND, (+)BS   Ext: no clubbing, cyanosis, or edema Psych: appears less anxious Musculoskeletal: TTP over R groin/R hip- no change- also TTP over inner thighs- R>L and over chest- sternum    Comments: UEs 5-/5 in RUE-  but appears 5-/5 LUE- 4/5 in LUE in same muscles tested RLE- HF 1/5? Pain; KE 2-/5, pain?, DF and PF at least 4/5, but limited by pain as well LLE- HF 2/5? Pain and weak; KE 3-/5, DF and PF 3-/5  Skin: bruising- purple/yellow on R>L inner thighs- no hematoma identified- mild swelling associated--stable to improved Neurological: Ox2- follows 1 step simple commands- no change  Assessment/Plan: 1. Functional deficits which require 3+ hours per day of  interdisciplinary therapy in a comprehensive inpatient rehab setting.  Physiatrist is providing close team supervision and 24 hour management of active medical problems listed below.  Physiatrist and rehab team continue to assess barriers to discharge/monitor patient progress toward functional and medical goals  Care Tool:  Bathing    Body parts bathed by patient: Right arm,Left arm,Chest,Abdomen,Front perineal area,Right upper leg,Left upper leg,Face,Right lower leg,Left lower leg   Body parts bathed by helper: Buttocks     Bathing assist Assist Level: Minimal Assistance - Patient > 75%     Upper Body Dressing/Undressing Upper body dressing   What is the patient wearing?: Pull over shirt    Upper body assist Assist Level: Minimal Assistance - Patient > 75%    Lower Body Dressing/Undressing Lower body dressing      What is the patient wearing?: Pants,Incontinence brief     Lower body assist Assist for lower body dressing: Maximal Assistance - Patient 25 - 49%     Toileting Toileting    Toileting assist Assist for toileting: Moderate Assistance - Patient 50 - 74%     Transfers Chair/bed transfer  Transfers assist     Chair/bed transfer assist level: Moderate Assistance - Patient 50 - 74%     Locomotion Ambulation   Ambulation assist      Assist level: Moderate Assistance - Patient 50 - 74% Assistive device: Walker-rolling Max distance:  20   Walk 10 feet activity   Assist  Walk 10 feet activity did not occur: Safety/medical concerns  Assist level: Moderate Assistance - Patient - 50 - 74% Assistive device: Walker-rolling   Walk 50 feet activity   Assist Walk 50 feet with 2 turns activity did not occur: Safety/medical concerns         Walk 150 feet activity   Assist Walk 150 feet activity did not occur: Safety/medical concerns         Walk 10 feet on uneven surface  activity   Assist Walk 10 feet on uneven surfaces activity did  not occur: Safety/medical concerns         Wheelchair     Assist Will patient use wheelchair at discharge?: Yes Type of Wheelchair: Manual    Wheelchair assist level: Minimal Assistance - Patient > 75% Max wheelchair distance: 90    Wheelchair 50 feet with 2 turns activity    Assist    Wheelchair 50 feet with 2 turns activity did not occur: Safety/medical concerns   Assist Level: Minimal Assistance - Patient > 75%   Wheelchair 150 feet activity     Assist  Wheelchair 150 feet activity did not occur: Safety/medical concerns       Blood pressure 131/67, pulse 82, temperature 97.9 F (36.6 C), temperature source Oral, resp. rate 20, weight 64.6 kg, SpO2 98 %.  Medical Problem List and Plan: 1.  Decreased functional mobility secondary to right superior and inferior pubic rami fracture nondisplaced after a fall.  Weightbearing as tolerated.  Follow-up Dr. Dion Saucier             -patient may  shower  1/10- explained sore legs and stiffness is normal- take prn pain meds             -ELOS/Goals: 10-14 days- supervision to CGA 2.  Antithrombotics: -DVT/anticoagulation: Chronic Eliquis             -antiplatelet therapy: N/A 3. Pain Management: Neurontin 100 mg nightly as needed, Lidoderm patch as directed oxycodone and Zanaflex as needed- will add tramadol 50 mg q6 hours prn since pt says Percocet wears off too fast.   1/5- will add lidoderm patches 2 patches 12 hrs on;12 hrs off- 8am to 8pm  1/11- pain controlled except intermittent chest pain- con't regimen 4. Mood: Provide emotional support             -antipsychotic agents: N/A 5. Neuropsych: This patient is? capable of making decisions on her own behalf. 6. Skin/Wound Care: Routine skin checks 7. Fluids/Electrolytes/Nutrition: Routine in and outs with follow-up chemistries 8.  Chronic atrial fibrillation.  Continue Eliquis.  Cardiac rate controlled.  Lopressor 12.5 mg twice daily  1/9 rate up a little this morning,  has frequently been close to 100   -will increase lopressor to 25mg  bid which should also help with CP  1/10- rate still 97 this AM-monitor   1/11- rate in 80s- con't regimen 9.  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  Received CIR 10/19/2016 to 11/01/2016.  Patient used a rolling walker prior to admission. 10.  Diabetes mellitus.  Hemoglobin A1c 6.4.  Metformin resumed 500 mg twice daily  1/5- restarted metformin and stopped insulin this AM- didn't give it-   1/6- BGs 99-128 in last 24 hours- doing great- con't regimen  1/11- BGs 77-140- con't regimen 11.  Hyperlipidemia.  Pravachol 12.  GERD.  Protonix 13.  Constipation.  Colace 100 mg twice daily, MiraLAX as needed- also senokot 1 tab BID- if doesn't work, will do sorbitol tomorrow  1/5- will give sorbitol this evening  1/6- had multiple BMs last night- con't regimen 14. Urinary frequency- will give purewick at night.  1/5- working well- con't purewick, but only at night.   1/6- didn't use purewick last night because frequent stools. But will nightly 15. Chest pain  1/7- gave NTG x2- asked nursing to give 1 more if possible- EKG is OK upon my reading of it- just shows Afib- Troponin done- had been ~4 a few days ago- is now 6- feeling much better with NTG- will monitor closely- if gets worse, will call Cards again.      1/9 having intermittent cp relieved by nitro.    -increase metoprolol as above   -continue prn sl nitro   -will check another EKG    -question anxiety component also    -will add small dose prn xanax  1/10- chest pain/tightness 7/10 per pt- also SOB (doesn't look SOB), and fatigued- will order another troponins and EKG and called Cards to see her, since recurrent.   1/11- Cards has ordered stress testing for this AM- is NPO for this- await results/cards input. 16. Poor appetite-   1/11- will check if due to constipation-  check KUB; if (-), might need to start something for appetite- like Remeron?    LOS: 7 days A FACE TO FACE EVALUATION WAS PERFORMED  [name] 10/27/2020, 9:17 AM

## 2020-10-27 NOTE — Progress Notes (Signed)
nuc study will be read tomorrow

## 2020-10-27 NOTE — Progress Notes (Addendum)
Progress Note  Patient Name: [name] Date of Encounter: 10/27/2020  [facility] Cardiologist: New  Subjective   History obtained with assistance of daughter by phone; Describes CP; no dyspnea  Inpatient Medications    Scheduled Meds: . apixaban  5 mg Oral BID  . cholecalciferol  2,000 Units Oral Daily  . lidocaine  2 patch Transdermal Q24H  . metFORMIN  500 mg Oral BID WC  . metoprolol tartrate  25 mg Oral BID  . mirabegron ER  50 mg Oral QHS  . pantoprazole  40 mg Oral Daily  . pravastatin  20 mg Oral QHS  . senna-docusate  1 tablet Oral BID   Continuous Infusions:  PRN Meds: acetaminophen, bisacodyl, butalbital-acetaminophen-caffeine, fluticasone, gabapentin, levalbuterol, nitroGLYCERIN, oxyCODONE-acetaminophen, polyethylene glycol, tiZANidine, traMADol   Vital Signs    Vitals:   10/26/20 1941 10/26/20 2104 10/27/20 0421 10/27/20 0808  BP: 114/73 117/60 109/70 131/67  Pulse: 80 93 89 82  Resp: 16  20   Temp: 98.7 F (37.1 C)  97.9 F (36.6 C)   TempSrc:   Oral   SpO2: 98%  96% 98%  Weight:        Intake/Output Summary (Last 24 hours) at 10/27/2020 1002 Last data filed at 10/27/2020 0640 Gross per 24 hour  Intake 320 ml  Output 650 ml  Net -330 ml   Last 3 Weights 10/20/2020 10/19/2020 10/17/2020  Weight (lbs) 142 lb 6.7 oz 150 lb 2.1 oz 142 lb 10.2 oz  Weight (kg) 64.6 kg 68.1 kg 64.7 kg      Physical Exam   GEN: No acute distress.   Neck: No JVD Cardiac: irregular Respiratory: Clear to auscultation bilaterally. GI: Soft, nontender, non-distended  MS: No edema Neuro:  Nonfocal  Psych: Normal affect   Labs    High Sensitivity Troponin:   Recent Labs  Lab 10/19/20 0959 10/23/20 0657 10/23/20 0950 10/26/20 0954 10/26/20 1147  TROPONINIHS 4 6 5 6 5       Chemistry Recent Labs  Lab 10/21/20 0543 10/26/20 0547  NA 138 138  K 3.9 3.8  CL 102 101  CO2 25 25  GLUCOSE 147* 151*  BUN 15 13  CREATININE 0.60 0.66  CALCIUM 8.2* 9.0  PROT  5.4*  --   ALBUMIN 2.9*  --   AST 12*  --   ALT 12  --   ALKPHOS 34*  --   BILITOT 1.0  --   GFRNONAA >60 >60  ANIONGAP 11 12     Hematology Recent Labs  Lab 10/21/20 0543 10/26/20 0547  WBC 8.6 6.6  RBC 3.07* 3.19*  HGB 10.1* 10.3*  HCT 31.3* 31.9*  MCV 102.0* 100.0  MCH 32.9 32.3  MCHC 32.3 32.3  RDW 12.4 12.0  PLT 242 403*    Patient Profile     80 year old female with past medical history of permanent atrial fibrillation, diabetes mellitus, hyperlipidemia, obstructive sleep apnea, previous CVA admitted after falling and suffering a right pubic ramus fracture for evaluation of chest pain.  Note patient does not speak 76.  She is from [place] and history is obtained with assistance of her daughter. Echocardiogram December 2021 at [facility] showed normal LV function, severe biatrial enlargement, mild aortic insufficiency, mild to moderate mitral regurgitation and mild tricuspid regurgitation.  Assessment & Plan    1 chest pain-patient continues to have intermittent chest pain etiology unclear.  This has been intermittent for several years by report.  Previous electrocardiogram showed no ST changes.  We will repeat today.  Note enzymes were negative.  Patient not a good candidate for cardiac CTA given atrial fibrillation.  Plan Lexiscan nuclear study today for risk stratification.   2 permanent atrial fibrillation-continue toprol and apixaban. 3 hyperlipidemia-continue statin.  For questions or updates, please contact [facility] Please consult [email or web address] for contact info under  Signed, Olga Millers, MD  10/27/2020, 10:02 AM   Addendum-electrocardiogram personally reviewed.  Patient remains in atrial fibrillation but no new ST changes.  Continue plan as above. Olga Millers

## 2020-10-28 ENCOUNTER — Inpatient Hospital Stay (HOSPITAL_COMMUNITY): Payer: Medicare Other

## 2020-10-28 ENCOUNTER — Other Ambulatory Visit: Payer: Self-pay

## 2020-10-28 LAB — NM MYOCAR MULTI W/SPECT W/WALL MOTION / EF
Estimated workload: 1 METS
Exercise duration (min): 5 min
Exercise duration (sec): 27 s
MPHR: 141 {beats}/min
Peak HR: 93 {beats}/min
Percent HR: 65 %
Rest HR: 78 {beats}/min
TID: 0.95

## 2020-10-28 LAB — GLUCOSE, CAPILLARY
Glucose-Capillary: 140 mg/dL — ABNORMAL HIGH (ref 70–99)
Glucose-Capillary: 81 mg/dL (ref 70–99)
Glucose-Capillary: 88 mg/dL (ref 70–99)

## 2020-10-28 MED ORDER — MIRTAZAPINE 15 MG PO TABS
15.0000 mg | ORAL_TABLET | Freq: Every day | ORAL | Status: DC
  Administered 2020-10-28 – 2020-11-04 (×7): 15 mg via ORAL
  Filled 2020-10-28 (×7): qty 1

## 2020-10-28 MED ORDER — MENTHOL 3 MG MT LOZG
1.0000 | LOZENGE | OROMUCOSAL | Status: DC | PRN
  Administered 2020-10-29 (×3): 3 mg via ORAL
  Filled 2020-10-28: qty 9

## 2020-10-28 MED ORDER — PHENOL 1.4 % MT LIQD
1.0000 | OROMUCOSAL | Status: DC | PRN
  Administered 2020-10-29 (×2): 1 via OROMUCOSAL
  Filled 2020-10-28: qty 177

## 2020-10-28 NOTE — Progress Notes (Signed)
Physical Therapy Weekly Progress Note  Patient Details  Name: [name] MRN: [id number] Date of Birth: 08-Feb-1941  Beginning of progress report period: October 21, 2020 End of progress report period: October 28, 2020  Today's Date: 10/28/2020 PT Individual Time:Session1: 0177-9390; Arthor Captain: 3009-2330 PT Individual Time Calculation (min): 60 min & 58 min  Patient has met 4 of 4 short term goals.  Patient making good progress towards goals despite pain limiting treatment at times.  She remains mostly  Mod A for stand pivot transfers and has walked up to 56' with RW with assist for weight shifts and decreased foot clearance bilaterally.  She has improved with stair negotiation and car transfers, but will benefit from further training.   Patient continues to demonstrate the following deficits muscle weakness, muscle joint tightness and pain and therefore will continue to benefit from skilled PT intervention to increase functional independence with mobility.  Patient progressing toward long term goals..  Continue plan of care.  PT Short Term Goals Week 1:  PT Short Term Goal 1 (Week 1): Patient to perform bed mobility with min to mod A. PT Short Term Goal 1 - Progress (Week 1): Met PT Short Term Goal 2 (Week 1): Patient to perform sit to stand with min to mod A. PT Short Term Goal 2 - Progress (Week 1): Met PT Short Term Goal 3 (Week 1): Patient to ambulate 71' with RW and min A. PT Short Term Goal 3 - Progress (Week 1): Met PT Short Term Goal 4 (Week 1): Patient to perform standing activity with 1 UE support and min A for improved balance. PT Short Term Goal 4 - Progress (Week 1): Met Week 2:  PT Short Term Goal 1 (Week 2): Patient will ambulate 66' with CGA with RW. PT Short Term Goal 2 (Week 2): Patient will negotiate 2 steps with min to mod A with rail PT Short Term Goal 3 (Week 2): Patient will perform car transfer with mod A.  Skilled Therapeutic Interventions/Progress Updates:   Ambulation/gait training;DME/adaptive equipment instruction;Functional mobility training;Balance/vestibular training;Patient/family education;Therapeutic Activities;UE/LE Strength taining/ROM;Wheelchair propulsion/positioning;UE/LE Coordination activities;Therapeutic Exercise;Stair training  Session1: Patient in supine, reports pain 7/10, but noted in chart had medication at 0830.  Used [employer] video interpreter [name] [phone] throughout session.  Patient supine to sit with mod A.  Sit to stand min A and pivot with RW to w/c with mod A.  Pushed in w/c to therapy gym.  Performed sit to stand min A and ambulaed around cones with RW and mod A for controlling walker and for weight shifts. Attempted standing hip flexion, but pt began walking forward so ambulated again with mod A with RW and mod A for balance, weight shifts and walker management.   Patient seated on mat for UE therex to include rows with orange t-band, horizontal abduction and diagonal D2 pattern shoulder flexion all x 10.  PAtient stand pivot to w/c min/mod A with RW and assisted to ortho gym.  Transferred to Nu Step with min/mod A and performed 6 minutes with 2-3 rest breaks at level 1 for UE/LE. Stand step to RW with min A using RW.  Transported to room in w/c and stand step to recliner mod A with RW.  Left seated with call bell and needs in reach and chair alarm active.   Sesison2: Patient in recliner reporting improved pain.  Sit to stand min A and step to w/c with RW increased time and min to mod A throughout session.  Used  [employer] video interpreter [name] throughout session.  Pushed in w/c to ortho gym.  Performed car transfer to simulated van height using step stool with mod cues, increased time and mod A.  Returned to w/c and pt propelled w/c x 60' with mod to max cues, and occasional hand over hand assist for turns.  Patient assisted to general gym and standing at stairs (3") with hand rails for support performed taps to first step with alternating  feet 2 x 5 reps to work on foot clearance for stepping.  In parallel bars patient standing to twist to give then get ball on R/L side cues for visualizing therapist as giving or getting ball for increased trunk rotation and to work on habituation with pt only mild c/o dizziness.  Close S required for activity standing without UE support.  Patient in bars to step over sticks on the floor x 2 working on foot clearance and turns with min to minguard A, increased time and heavy UE support.  Patient assisted in w/c to room and back to recliner.  Left with call bell and needs in reach with chair alarm active.  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, pelvic pain, [place] - needs translator Restrictions Weight Bearing Restrictions: No RLE Weight Bearing: Non weight bearing Other Position/Activity Restrictions: WBAT Pain: Pain Assessment Pain Scale: 0-10 Pain Score: 7  Faces Pain Scale: Hurts little more Pain Type: Acute pain Pain Location: Leg Pain Orientation: Right Pain Descriptors / Indicators: Aching;Tender Pain Frequency: Constant Pain Onset: On-going Patients Stated Pain Goal: 3 Pain Intervention(s): Rest Multiple Pain Sites: No   Therapy/Group: Individual Therapy  Reginia Naas  Magda Kiel, PT 10/28/2020, 8:54 AM

## 2020-10-28 NOTE — Progress Notes (Signed)
Occupational Therapy Session Note  Patient Details  Name: [name] MRN: [id number] Date of Birth: 1940-12-26  Today's Date: 10/28/2020 OT Individual Time: 1300-1415 OT Individual Time Calculation (min): 75 min    Short Term Goals: Week 1:  OT Short Term Goal 1 (Week 1): pt will perform sit to stand with Min A in prep for LB ADL OT Short Term Goal 2 (Week 1): Pt will transfer with Mod A to toilet with LRAD OT Short Term Goal 3 (Week 1): Pt will perform clothing management for toilet tasks with no more than Mod A OT Short Term Goal 4 (Week 1): Pt will perform LB bathing with AE PRN with Min A  Skilled Therapeutic Interventions/Progress Updates:    Pt received in recliner c/o pain in RLE 8/10. Video interpreter used throughout session: [name]. RN administered pain medication at beginning of session. Pt completes 3x30 ball toss (chest, bounce, overhead pass) in seated position with 1.5# wrist weights to improve BUE coordination/strengthening required for BADLs/functional transfers. Prolonged rest provided d/t poor activity tolerance. Pt completes standing clothes pin matching for dynamic balance/reaching and lateral weight shifting. Pt reporting SOB during standing trials but VSS. Pt stands 2 trials total with CGA. Pt completes SPT back to recliner in room with improved management of RLE and MIN A. Pt then reporting need to toilet. Recliner<>BSC in room with stedy for time management. Pt toilets with A for clothing management after void. Pt then indicates wanting to get into bed. Stedy utilized in same manner. Exited session with pt seated in bed, exit alarm on and call light in reach    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, pelvic pain, [place] - needs translator Restrictions Weight Bearing Restrictions: No RLE Weight Bearing: Weight bearing as tolerated Other Position/Activity Restrictions: WBAT General:   Vital  Signs: Therapy Vitals Temp: (!) 97.5 F (36.4 C) Temp Source: Oral Pulse Rate: 81 Resp: 18 BP: 116/66 Patient Position (if appropriate): Lying Oxygen Therapy SpO2: 99 % O2 Device: Room Air Pain:   ADL: ADL Grooming: Setup Where Assessed-Grooming: Sitting at sink Upper Body Bathing: Supervision/safety Where Assessed-Upper Body Bathing: Sitting at sink Lower Body Bathing: Moderate assistance Where Assessed-Lower Body Bathing: Sitting at sink Upper Body Dressing: Minimal assistance Where Assessed-Upper Body Dressing: Sitting at sink Lower Body Dressing: Dependent Where Assessed-Lower Body Dressing: Wheelchair Toileting: Maximal assistance Where Assessed-Toileting: Toilet,Bedside Commode Toilet Transfer: Other (comment) [name]) Vision   Perception    Praxis   Exercises:   Other Treatments:     Therapy/Group: Individual Therapy  [name] 10/28/2020, 6:49 AM

## 2020-10-28 NOTE — Progress Notes (Signed)
Patient ID: [name], female   DOB: [birth year], 80 y.o.   MRN: [id number] Team and MD feel can benefit from staying until 1/24. Have moved discharge to this date. Made daughter aware.

## 2020-10-28 NOTE — Progress Notes (Addendum)
Progress Note  Patient Name: [name] Date of Encounter: 10/28/2020  Primary Cardiologist: New   Subjective   Doing well, no specific complaints   Inpatient Medications    Scheduled Meds: . apixaban  5 mg Oral BID  . cholecalciferol  2,000 Units Oral Daily  . lidocaine  2 patch Transdermal Q24H  . metFORMIN  500 mg Oral BID WC  . metoprolol tartrate  25 mg Oral BID  . mirabegron ER  50 mg Oral QHS  . mirtazapine  15 mg Oral QHS  . pantoprazole  40 mg Oral Daily  . pravastatin  20 mg Oral QHS  . senna-docusate  1 tablet Oral BID   Continuous Infusions:  PRN Meds: acetaminophen, bisacodyl, butalbital-acetaminophen-caffeine, fluticasone, gabapentin, levalbuterol, menthol-cetylpyridinium, nitroGLYCERIN, oxyCODONE-acetaminophen, phenol, polyethylene glycol, tiZANidine, traMADol   Vital Signs    Vitals:   10/27/20 1939 10/28/20 0310 10/28/20 0600 10/28/20 0800  BP: (!) 101/58 116/66  (!) 104/43  Pulse: 85 81 85 88  Resp: 16 18    Temp: 98.3 F (36.8 C) (!) 97.5 F (36.4 C)    TempSrc: Oral Oral    SpO2: 97% 99% 100%   Weight:        Intake/Output Summary (Last 24 hours) at 10/28/2020 0845 Last data filed at 10/27/2020 1841 Gross per 24 hour  Intake 480 ml  Output -  Net 480 ml   Filed Weights   10/20/20 1611  Weight: 64.6 kg    Physical Exam   General: Elderly, NAD Neck: Negative for carotid bruits. No JVD Lungs:Clear to ausculation bilaterally. No wheezes, rales, or rhonchi. Breathing is unlabored. Cardiovascular: Irregularly irregular. No murmurs Abdomen: Soft, non-tender, non-distended. No obvious abdominal masses. Extremities: No edema. Radial pulses 2+ bilaterally Neuro: Alert and oriented. No focal deficits. No facial asymmetry. MAE spontaneously. Psych: Responds to questions appropriately with normal affect.    Labs    Chemistry Recent Labs  Lab 10/26/20 0547  NA 138  K 3.8  CL 101  CO2 25  GLUCOSE 151*  BUN 13  CREATININE 0.66   CALCIUM 9.0  GFRNONAA >60  ANIONGAP 12     Hematology Recent Labs  Lab 10/26/20 0547  WBC 6.6  RBC 3.19*  HGB 10.3*  HCT 31.9*  MCV 100.0  MCH 32.3  MCHC 32.3  RDW 12.0  PLT 403*    Cardiac EnzymesNo results for input(s): TROPONINI in the last 168 hours. No results for input(s): TROPIPOC in the last 168 hours.   BNPNo results for input(s): BNP, PROBNP in the last 168 hours.   DDimer No results for input(s): DDIMER in the last 168 hours.   Radiology    DG Abd 1 View  Result Date: 10/27/2020 CLINICAL DATA:  Abdominal pain.  Loss of appetite. EXAM: ABDOMEN - 1 VIEW COMPARISON:  10/17/2020 FINDINGS: Nonobstructive bowel gas pattern. Small to moderate amount of stool in the right colon. Mild curvature in the thoracolumbar spine. Again noted are bilateral pubic rami fractures. IMPRESSION: 1. Normal bowel gas pattern. 2. Pelvic fractures. Electronically Signed   By: Richarda Overlie M.D.   On: 10/27/2020 10:27   NM Myocar Multi W/Spect W/Wall Motion / EF  Result Date: 10/28/2020  There was no ST segment deviation noted during stress.  No T wave inversion was noted during stress.  Defect 1: There is a small defect of mild severity present in the apical anterior location.  The left ventricular ejection fraction is normal (55-65%).  This is a  low risk study.  There is a mild perfusion defect on resting images at the anterior apical segment. This is slightly more pronounced on stress images. Normal wall motion in this area. It may be breast attenuation given location, but cannot exclude small prior infarct with partial reversibility. Given size of area at risk, overall a low risk study. No severe ischemia noted.   Telemetry    Not currently on telemetry - Personally Reviewed  ECG    No new tracing as of 10/28/20- Personally Reviewed  Cardiac Studies   Lexiscan stress test 10/27/20:   There was no ST segment deviation noted during stress.  No T wave inversion was noted during  stress.  Defect 1: There is a small defect of mild severity present in the apical anterior location.  The left ventricular ejection fraction is normal (55-65%).  This is a low risk study.   There is a mild perfusion defect on resting images at the anterior apical segment. This is slightly more pronounced on stress images. Normal wall motion in this area. It may be breast attenuation given location, but cannot exclude small prior infarct with partial reversibility. Given size of area at risk, overall a low risk study. No severe ischemia noted.  Patient Profile     80 y.o. 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 Berline Chough.    Assessment & Plan    1. Chest pain: -Pt presented to [facility] 10/17/20 after sustaining a mechanical fall which resulted in a pubic fracture. She was admitted to internal medicine and orthopedic team was consulted who recommended conservative measures with pain control and weight bearing as tolerated. She worked with PT during her course who then recommended CIR for further therapy. -Lexiscan stress test 10/27/20 with small defect of mild severity present in the apical anterior locationmild perfusion defect on resting images at the anterior apical segment. This is slightly more pronounced on stress images. Normal wall motion in this area. It may be breast attenuation given location, but cannot exclude small prior infarct with partial reversibility. Given size of area at risk, overall a low risk study. No severe ischemia noted. -Will discuss findings with MD>>likely  will optimize medical therapy and if persistent chest pain after recovery from pelvic fracture>>consider LHC? -Continue pravastatin, metoprolol   2. Pelvic fracture: -Presented to [facility] after sustaining a mechanical fall with recommendations per ortho for conservative measure -Continue with PT/OT and pain control   3. Chronic atrial fibrillation: -Previously followed with cardiology at [facility] however has been more recently managed by her PCP -Continue with AC and rate control with metoprolol   4. HLD: -Continue statin   5. DM2: -Continue Metformin per primary team  -Last Hb A1C, 6.4  6. History of CVA: -Continue statin   Signed, Georgie Chard NP-C [facility] Pager: [phone] 10/28/2020, 8:45 AM     For questions or updates, please contact   Please consult [email or web address] for contact info under Cardiology/STEMI. As above, patient seen and examined.  History obtained with the assistance of interpreter.  States her chest pain is better.  She denies dyspnea.  Enzymes are negative as outlined previously.  Electrocardiogram shows no ST changes.  I have personally reviewed the patient's nuclear study and feel there is mild breast attenuation but no significant ischemia.  No plans for further ischemia evaluation.  Continue metoprolol for rate control of atrial fibrillation.  Continue apixaban.  Cardiology will sign off.  Please continue present medications at discharge.  Patient can follow-up with me for cardiac issues 3 months after discharge. Olga Millers, MD

## 2020-10-28 NOTE — Progress Notes (Signed)
[facility] PHYSICAL MEDICINE & REHABILITATION PROGRESS NOTE   Subjective/Complaints:  Asking for stress test results- per Cards, will optimize medical therapy- will consider L Heart cath.   Pt says chest pain is better- now just a little tired, but that's better as well.  LBM 2 days ago- but says because not eating- is scared will gain a lot of weight with appetite stimulant- she thinks she's eating "ok".   Also feels like throat is sore/inflamed, like something "stuck" in there- like "phlegm".  Will order Throat spay, lozenges.    ROS: limited by language  Objective:   DG Abd 1 View  Result Date: 10/27/2020 CLINICAL DATA:  Abdominal pain.  Loss of appetite. EXAM: ABDOMEN - 1 VIEW COMPARISON:  10/17/2020 FINDINGS: Nonobstructive bowel gas pattern. Small to moderate amount of stool in the right colon. Mild curvature in the thoracolumbar spine. Again noted are bilateral pubic rami fractures. IMPRESSION: 1. Normal bowel gas pattern. 2. Pelvic fractures. Electronically Signed   By: Richarda Overlie M.D.   On: 10/27/2020 10:27   NM Myocar Multi W/Spect W/Wall Motion / EF  Result Date: 10/28/2020  There was no ST segment deviation noted during stress.  No T wave inversion was noted during stress.  Defect 1: There is a small defect of mild severity present in the apical anterior location.  The left ventricular ejection fraction is normal (55-65%).  This is a low risk study.  There is a mild perfusion defect on resting images at the anterior apical segment. This is slightly more pronounced on stress images. Normal wall motion in this area. It may be breast attenuation given location, but cannot exclude small prior infarct with partial reversibility. Given size of area at risk, overall a low risk study. No severe ischemia noted.   Recent Labs    10/26/20 0547  WBC 6.6  HGB 10.3*  HCT 31.9*  PLT 403*   Recent Labs    10/26/20 0547  NA 138  K 3.8  CL 101  CO2 25  GLUCOSE 151*  BUN 13   CREATININE 0.66  CALCIUM 9.0    Intake/Output Summary (Last 24 hours) at 10/28/2020 0945 Last data filed at 10/27/2020 1841 Gross per 24 hour  Intake 480 ml  Output -  Net 480 ml        Physical Exam: Vital Signs Blood pressure (!) 104/43, pulse 88, temperature (!) 97.5 F (36.4 C), temperature source Oral, resp. rate 18, weight 64.6 kg, SpO2 100 %.  Constitutional: sitting EOB, using tele-interpretor, appropriate, NAD HEENT: EOMI, moist, and oropharynx is not erythematous on exam- looks normal  Neck: supple Cardiovascular: irregular- rate controlled- in 80s- denies current CP  Respiratory/Chest: CTA B/L- no W/R/R- good air movement GI/Abdomen: Soft, NT, ND, (+)BS   Ext: no clubbing, cyanosis, or edema Psych: appears less anxious Musculoskeletal: TTP over R groin/R hip- no change- also TTP over inner thighs- stable, but CP TTP is resolved    Comments: UEs 5-/5 in RUE-  but appears 5-/5 LUE- 4/5 in LUE in same muscles tested RLE- HF 1/5? Pain; KE 2-/5, pain?, DF and PF at least 4/5, but limited by pain as well LLE- HF 2/5? Pain and weak; KE 3-/5, DF and PF 3-/5  Skin: bruising- purple/yellow on R>L inner thighs- no hematoma identified- mild swelling associated--stable to improved Neurological: Ox2  Assessment/Plan: 1. Functional deficits which require 3+ hours per day of interdisciplinary therapy in a comprehensive inpatient rehab setting.  Physiatrist is providing close team  supervision and 24 hour management of active medical problems listed below.  Physiatrist and rehab team continue to assess barriers to discharge/monitor patient progress toward functional and medical goals  Care Tool:  Bathing    Body parts bathed by patient: Right arm,Left arm,Chest,Abdomen,Front perineal area,Right upper leg,Left upper leg,Face,Right lower leg,Left lower leg   Body parts bathed by helper: Buttocks     Bathing assist Assist Level: Minimal Assistance - Patient > 75%     Upper  Body Dressing/Undressing Upper body dressing   What is the patient wearing?: Pull over shirt    Upper body assist Assist Level: Minimal Assistance - Patient > 75%    Lower Body Dressing/Undressing Lower body dressing      What is the patient wearing?: Pants,Incontinence brief     Lower body assist Assist for lower body dressing: Maximal Assistance - Patient 25 - 49%     Toileting Toileting    Toileting assist Assist for toileting: Moderate Assistance - Patient 50 - 74%     Transfers Chair/bed transfer  Transfers assist     Chair/bed transfer assist level: Moderate Assistance - Patient 50 - 74%     Locomotion Ambulation   Ambulation assist      Assist level: Moderate Assistance - Patient 50 - 74% Assistive device: Walker-rolling Max distance: 40'   Walk 10 feet activity   Assist  Walk 10 feet activity did not occur: Safety/medical concerns  Assist level: Moderate Assistance - Patient - 50 - 74% Assistive device: Walker-rolling   Walk 50 feet activity   Assist Walk 50 feet with 2 turns activity did not occur: Safety/medical concerns         Walk 150 feet activity   Assist Walk 150 feet activity did not occur: Safety/medical concerns         Walk 10 feet on uneven surface  activity   Assist Walk 10 feet on uneven surfaces activity did not occur: Safety/medical concerns         Wheelchair     Assist Will patient use wheelchair at discharge?: Yes Type of Wheelchair: Manual    Wheelchair assist level: Minimal Assistance - Patient > 75% Max wheelchair distance: 90    Wheelchair 50 feet with 2 turns activity    Assist    Wheelchair 50 feet with 2 turns activity did not occur: Safety/medical concerns   Assist Level: Minimal Assistance - Patient > 75%   Wheelchair 150 feet activity     Assist  Wheelchair 150 feet activity did not occur: Safety/medical concerns       Blood pressure (!) 104/43, pulse 88,  temperature (!) 97.5 F (36.4 C), temperature source Oral, resp. rate 18, weight 64.6 kg, SpO2 100 %.  Medical Problem List and Plan: 1.  Decreased functional mobility secondary to right superior and inferior pubic rami fracture nondisplaced after a fall.  Weightbearing as tolerated.  Follow-up Dr. Dion Saucier             -patient may  shower  1/10- explained sore legs and stiffness is normal- take prn pain meds             -ELOS/Goals: 10-14 days- supervision to CGA 2.  Antithrombotics: -DVT/anticoagulation: Chronic Eliquis             -antiplatelet therapy: N/A 3. Pain Management: Neurontin 100 mg nightly as needed, Lidoderm patch as directed oxycodone and Zanaflex as needed- will add tramadol 50 mg q6 hours prn since pt says Percocet wears  off too fast.   1/5- will add lidoderm patches 2 patches 12 hrs on;12 hrs off- 8am to 8pm  1/11- pain controlled except intermittent chest pain- con't regimen  1/12- CP resolved- overall pain controlled- con't regimen 4. Mood: Provide emotional support             -antipsychotic agents: N/A 5. Neuropsych: This patient is? capable of making decisions on her own behalf. 6. Skin/Wound Care: Routine skin checks 7. Fluids/Electrolytes/Nutrition: Routine in and outs with follow-up chemistries 8.  Chronic atrial fibrillation.  Continue Eliquis.  Cardiac rate controlled.  Lopressor 12.5 mg twice daily  1/9 rate up a little this morning, has frequently been close to 100   -will increase lopressor to 25mg  bid which should also help with CP  1/12- Rate in 80s- con't regimen 9.  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  Received CIR 10/19/2016 to 11/01/2016.  Patient used a rolling walker prior to admission. 10.  Diabetes mellitus.  Hemoglobin A1c 6.4.  Metformin resumed 500 mg twice daily  1/5- restarted metformin and stopped insulin this AM- didn't give it-   1/12-  BGs 104-140- con't regimen- will change to qday for BG checks 11.  Hyperlipidemia.  Pravachol 12.  GERD.  Protonix 13.  Constipation.  Colace 100 mg twice daily, MiraLAX as needed- also senokot 1 tab BID- if doesn't work, will do sorbitol tomorrow  1/5- will give sorbitol this evening  1/6- had multiple BMs last night- con't regimen  1/12- says LBM 2 days ago- but isn't eating-  14. Urinary frequency- will give purewick at night.  1/5- working well- con't purewick, but only at night.   1/6- didn't use purewick last night because frequent stools. But will nightly 15. Chest pain  1/7- gave NTG x2- asked nursing to give 1 more if possible- EKG is OK upon my reading of it- just shows Afib- Troponin done- had been ~4 a few days ago- is now 6- feeling much better with NTG- will monitor closely- if gets worse, will call Cards again.      1/9 having intermittent cp relieved by nitro.    -increase metoprolol as above   -continue prn sl nitro   -will check another EKG    -question anxiety component also    -will add small dose prn xanax  1/10- chest pain/tightness 7/10 per pt- also SOB (doesn't look SOB), and fatigued- will order another troponins and EKG and called Cards to see her, since recurrent.   1/11- Cards has ordered stress testing for this AM- is NPO for this- await results/cards input.  1/12- will optimize medical mgmt- Cards thinking about Left heart cath?? 16. Poor appetite-   1/11- will check if due to constipation- check KUB; if (-), might need to start something for appetite- like Remeron?  1/12- will start Remeron 15 mg QHS for sleep/appetite- pt scared will get "heavy"- explained she's not eating a lot- needs to eat a little more.     LOS: 8 days A FACE TO FACE EVALUATION WAS PERFORMED  Balthazar Dooly 10/28/2020, 9:45 AM

## 2020-10-29 ENCOUNTER — Inpatient Hospital Stay (HOSPITAL_COMMUNITY): Payer: Medicare Other

## 2020-10-29 LAB — GLUCOSE, CAPILLARY
Glucose-Capillary: 112 mg/dL — ABNORMAL HIGH (ref 70–99)
Glucose-Capillary: 125 mg/dL — ABNORMAL HIGH (ref 70–99)

## 2020-10-29 MED ORDER — SORBITOL 70 % SOLN
45.0000 mL | Freq: Once | Status: AC
  Administered 2020-10-29: 45 mL via ORAL
  Filled 2020-10-29: qty 60

## 2020-10-29 NOTE — Progress Notes (Signed)
Occupational Therapy Session Note  Patient Details  Name: [name] MRN: [id number] Date of Birth: 18-Feb-1941  Today's Date: 10/29/2020 OT Individual Time: 1335-1430 OT Individual Time Calculation (min): 55 min    Short Term Goals: Week 1:  OT Short Term Goal 1 (Week 1): pt will perform sit to stand with Min A in prep for LB ADL OT Short Term Goal 2 (Week 1): Pt will transfer with Mod A to toilet with LRAD OT Short Term Goal 3 (Week 1): Pt will perform clothing management for toilet tasks with no more than Mod A OT Short Term Goal 4 (Week 1): Pt will perform LB bathing with AE PRN with Min A  Skilled Therapeutic Interventions/Progress Updates:    1:1. Pt received in reclienr agreeable to OT. Pt completes UB and LB dressing seated in recliner with set up fo rshirt and MOD A to don pants using stool under feet and CGA for steadying to advance pant past hips. Pt completes stand pivot transfers recliner<>w/c<>toilet with manual facilitation for weight shifting during pivot steps. Pt requires CGA for clothing management and S for wiping seated. Pt utilized stedy at end of session for second toileting d/t time constraint. Pt completes standing balance activity sorting cards on vertical board reaching in min ranges outside BOS. Pt completes 3x1 min beach ball volley with 2# dowel rod for BUE strengthening and endurance and coordination required for BADLS. 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, pelvic pain, [place] - needs translator Restrictions Weight Bearing Restrictions: No RLE Weight Bearing: Non weight bearing Other Position/Activity Restrictions: WBAT General:   Vital Signs: Therapy Vitals Temp: 97.7 F (36.5 C) Temp Source: Oral Pulse Rate: 68 Resp: 16 BP: 130/71 Patient Position (if appropriate): Lying Oxygen Therapy SpO2: 94 % O2  Device: Room Air Pain: Pain Assessment Pain Score: 9  Faces Pain Scale: Hurts a little bit Pain Type: Acute pain Pain Location: Groin Pain Orientation: Right Pain Descriptors / Indicators: Grimacing;Discomfort Pain Onset: With Activity Pain Intervention(s): Repositioned;Rest ADL: ADL Grooming: Setup Where Assessed-Grooming: Sitting at sink Upper Body Bathing: Supervision/safety Where Assessed-Upper Body Bathing: Sitting at sink Lower Body Bathing: Moderate assistance Where Assessed-Lower Body Bathing: Sitting at sink Upper Body Dressing: Minimal assistance Where Assessed-Upper Body Dressing: Sitting at sink Lower Body Dressing: Dependent Where Assessed-Lower Body Dressing: Wheelchair Toileting: Maximal assistance Where Assessed-Toileting: Toilet,Bedside Commode Toilet Transfer: Other (comment) [name]) Vision   Perception    Praxis   Exercises:   Other Treatments:     Therapy/Group: Individual Therapy  [name] 10/29/2020, 9:14 AM

## 2020-10-29 NOTE — Progress Notes (Signed)
[facility] PHYSICAL MEDICINE & REHABILITATION PROGRESS NOTE   Subjective/Complaints:  Pt reports no more chest pain- feeling no pain currently-   Feels tired, but that's only issue, except "LBM was 4 days ago"- except yesterday it had been 2 days.   Says it feels like she's "not digesting her food".   Also, per nursing, she's home sick and asks to go to bathroom q20 minutes since wants someone in room.    ROS: limited by language/cognition  Objective:   DG Abd 1 View  Result Date: 10/27/2020 CLINICAL DATA:  Abdominal pain.  Loss of appetite. EXAM: ABDOMEN - 1 VIEW COMPARISON:  10/17/2020 FINDINGS: Nonobstructive bowel gas pattern. Small to moderate amount of stool in the right colon. Mild curvature in the thoracolumbar spine. Again noted are bilateral pubic rami fractures. IMPRESSION: 1. Normal bowel gas pattern. 2. Pelvic fractures. Electronically Signed   By: Richarda Overlie M.D.   On: 10/27/2020 10:27   NM Myocar Multi W/Spect W/Wall Motion / EF  Result Date: 10/28/2020  There was no ST segment deviation noted during stress.  No T wave inversion was noted during stress.  Defect 1: There is a small defect of mild severity present in the apical anterior location.  The left ventricular ejection fraction is normal (55-65%).  This is a low risk study.  There is a mild perfusion defect on resting images at the anterior apical segment. This is slightly more pronounced on stress images. Normal wall motion in this area. It may be breast attenuation given location, but cannot exclude small prior infarct with partial reversibility. Given size of area at risk, overall a low risk study. No severe ischemia noted.   No results for input(s): WBC, HGB, HCT, PLT in the last 72 hours. No results for input(s): NA, K, CL, CO2, GLUCOSE, BUN, CREATININE, CALCIUM in the last 72 hours.  Intake/Output Summary (Last 24 hours) at 10/29/2020 0857 Last data filed at 10/29/2020 0849 Gross per 24 hour  Intake 232  ml  Output 2 ml  Net 230 ml        Physical Exam: Vital Signs Blood pressure 130/71, pulse 68, temperature 97.7 F (36.5 C), temperature source Oral, resp. rate 16, height 4\' 10"  (1.473 m), weight 64.6 kg, SpO2 94 %.  Constitutional: sitting up in bedside chair, wearing mask covering mouth only, appears comfortable, NAD HEENT: EOMI, moist, and oropharynx is not erythematous on exam- looks normal  Neck: supple Cardiovascular: irregular- rate controlled Respiratory/Chest: CTA B/L- no W/R/R- good air movement GI/Abdomen: Soft, NT, ND, (+)BS hypoactive Ext: no clubbing, cyanosis, or edema Psych: more anxious- appears homesick per nursing? Musculoskeletal: TTP over R groin/R hip- no change- also TTP over inner thighs- stable, but CP TTP is resolved    Comments: UEs 5-/5 in RUE-  but appears 5-/5 LUE- 4/5 in LUE in same muscles tested RLE- HF 1/5? Pain; KE 2-/5, pain?, DF and PF at least 4/5, but limited by pain as well LLE- HF 2/5? Pain and weak; KE 3-/5, DF and PF 3-/5  Skin: bruising- purple/yellow on R>L inner thighs- no hematoma identified- mild swelling associated--stable to improved Neurological: Ox2  Assessment/Plan: 1. Functional deficits which require 3+ hours per day of interdisciplinary therapy in a comprehensive inpatient rehab setting.  Physiatrist is providing close team supervision and 24 hour management of active medical problems listed below.  Physiatrist and rehab team continue to assess barriers to discharge/monitor patient progress toward functional and medical goals  Care Tool:  Bathing  Body parts bathed by patient: Right arm,Left arm,Chest,Abdomen,Front perineal area,Right upper leg,Left upper leg,Face,Right lower leg,Left lower leg   Body parts bathed by helper: Buttocks     Bathing assist Assist Level: Minimal Assistance - Patient > 75%     Upper Body Dressing/Undressing Upper body dressing   What is the patient wearing?: Pull over shirt     Upper body assist Assist Level: Minimal Assistance - Patient > 75%    Lower Body Dressing/Undressing Lower body dressing      What is the patient wearing?: Pants,Incontinence brief     Lower body assist Assist for lower body dressing: Maximal Assistance - Patient 25 - 49%     Toileting Toileting    Toileting assist Assist for toileting: Moderate Assistance - Patient 50 - 74%     Transfers Chair/bed transfer  Transfers assist     Chair/bed transfer assist level: Moderate Assistance - Patient 50 - 74%     Locomotion Ambulation   Ambulation assist      Assist level: Moderate Assistance - Patient 50 - 74% Assistive device: Walker-rolling Max distance: 30'   Walk 10 feet activity   Assist  Walk 10 feet activity did not occur: Safety/medical concerns  Assist level: Moderate Assistance - Patient - 50 - 74% Assistive device: Walker-rolling   Walk 50 feet activity   Assist Walk 50 feet with 2 turns activity did not occur: Safety/medical concerns         Walk 150 feet activity   Assist Walk 150 feet activity did not occur: Safety/medical concerns         Walk 10 feet on uneven surface  activity   Assist Walk 10 feet on uneven surfaces activity did not occur: Safety/medical concerns         Wheelchair     Assist Will patient use wheelchair at discharge?: Yes Type of Wheelchair: Manual    Wheelchair assist level: Minimal Assistance - Patient > 75% Max wheelchair distance: 60    Wheelchair 50 feet with 2 turns activity    Assist    Wheelchair 50 feet with 2 turns activity did not occur: Safety/medical concerns   Assist Level: Minimal Assistance - Patient > 75%   Wheelchair 150 feet activity     Assist  Wheelchair 150 feet activity did not occur: Safety/medical concerns       Blood pressure 130/71, pulse 68, temperature 97.7 F (36.5 C), temperature source Oral, resp. rate 16, height 4\' 10"  (1.473 m), weight 64.6 kg,  SpO2 94 %.  Medical Problem List and Plan: 1.  Decreased functional mobility secondary to right superior and inferior pubic rami fracture nondisplaced after a fall.  Weightbearing as tolerated.  Follow-up Dr.             -patient may  shower  1/10- explained sore legs and stiffness is normal- take prn pain meds             -ELOS/Goals: 10-14 days- supervision to [facility]  1/13- D/c date moved to 1/24 due to daughter's schedule.  2.  Antithrombotics: -DVT/anticoagulation: Chronic Eliquis             -antiplatelet therapy: N/A 3. Pain Management: Neurontin 100 mg nightly as needed, Lidoderm patch as directed oxycodone and Zanaflex as needed- will add tramadol 50 mg q6 hours prn since pt says Percocet wears off too fast.   1/5- will add lidoderm patches 2 patches 12 hrs on;12 hrs off- 8am to 8pm  1/13-  pain controlled-  con't regimen 4. Mood: Provide emotional support             -antipsychotic agents: N/A 5. Neuropsych: This patient is? capable of making decisions on her own behalf. 6. Skin/Wound Care: Routine skin checks 7. Fluids/Electrolytes/Nutrition: Routine in and outs with follow-up chemistries 8.  Chronic atrial fibrillation.  Continue Eliquis.  Cardiac rate controlled.  Lopressor 12.5 mg twice daily  1/9 rate up a little this morning, has frequently been close to 100   -will increase lopressor to 25mg  bid which should also help with CP  1/13- rate 68 this AM- con't regimen 9.  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  Received CIR 10/19/2016 to 11/01/2016.  Patient used a rolling walker prior to admission. 10.  Diabetes mellitus.  Hemoglobin A1c 6.4.  Metformin resumed 500 mg twice daily  1/5- restarted metformin and stopped insulin this AM- didn't give it-   1/12- BGs 104-140- con't regimen- will change to qday for BG checks  1/13- BGs 81-140 well controlled- con't regimen 11.   Hyperlipidemia.  Pravachol 12.  GERD.  Protonix 13.  Constipation.  Colace 100 mg twice daily, MiraLAX as needed- also senokot 1 tab BID- if doesn't work, will do sorbitol tomorrow  1/5- will give sorbitol this evening  1/6- had multiple BMs last night- con't regimen  1/12- says LBM 2 days ago- but isn't eating-  1/13- now says feels constipated- says it's now 4 days since LBM - likely 3 days due to calculations yesterday- will give sorbitol today after therapy.  14. Urinary frequency- will give purewick at night.  1/5- working well- con't purewick, but only at night.   1/6- didn't use purewick last night because frequent stools. But will nightly 15. Chest pain  1/7- gave NTG x2- asked nursing to give 1 more if possible- EKG is OK upon my reading of it- just shows Afib- Troponin done- had been ~4 a few days ago- is now 6- feeling much better with NTG- will monitor closely- if gets worse, will call Cards again.      1/9 having intermittent cp relieved by nitro.    -increase metoprolol as above   -continue prn sl nitro   -will check another EKG    -question anxiety component also    -will add small dose prn xanax  1/10- chest pain/tightness 7/10 per pt- also SOB (doesn't look SOB), and fatigued- will order another troponins and EKG and called Cards to see her, since recurrent.   1/11- Cards has ordered stress testing for this AM- is NPO for this- await results/cards input.  1/12- will optimize medical mgmt- Cards thinking about Left heart cath?? 16. Poor appetite-   1/11- will check if due to constipation- check KUB; if (-), might need to start something for appetite- like Remeron?  1/12- will start Remeron 15 mg QHS for sleep/appetite- pt scared will get "heavy"- explained she's not eating a lot- needs to eat a little more.   1/13- will take a few days to work.     LOS: 9 days A FACE TO FACE EVALUATION WAS PERFORMED  Bronsen Serano 10/29/2020, 8:57 AM

## 2020-10-29 NOTE — Progress Notes (Signed)
Physical Therapy Session Note  Patient Details  Name: [name] MRN: [id number] Date of Birth: Feb 14, 1941  Today's Date: 10/29/2020 PT Individual Time:Session1: 5462-7035; [place]: [id number] PT Individual Time Calculation (min): 57 min & 55 min  Short Term Goals: Week 2:  PT Short Term Goal 1 (Week 2): Patient will ambulate 2' with CGA with RW. PT Short Term Goal 2 (Week 2): Patient will negotiate 2 steps with min to mod A with rail PT Short Term Goal 3 (Week 2): Patient will perform car transfer with mod A.  Skilled Therapeutic Interventions/Progress Updates:    Session1:  Patient in recliner requesting to toilet.  Assisted to bathroom with stedy performing clothing management with mod A and hygiene with CGA, but total A for transfer.  Patient in w/c washed hands at sink and combed hair.  Assisted in w/c to therapy gym.  Performed stair negotiation with rails and mod A on 6" steps x 4.  Mod cues for technique/sequence as well.  Assisted for interpretation through [employer] video interpreter Sonia Side # [id number].  Patient in parallel bars for trunk rotation to give and get ball with no UE support and close S cues for visualization of therapist and only c/o mild dizziness.  Patient performed step taps to 4" step with UE support for working on foot clearance and balance.  Step ups with L to 4" step with UE support and min A x 5.  Patient c/o fatigue.  Assisted in w/c to room, stand step to recliner with min to mod A.  Left seated with seat belt alarm activated, stool under feet and needs/call bell in reach.  Session2:  Patient in recliner and continue to complain of sleepiness.  Assisted with communication via [employer] tele interpreter Debarah Crape. Patient sit to stand min A, stepping to w/c with RW and min A increased time and cues for foot clearance.  Pushed in w/c to therapy gym.  Ambulated x 25' x 2 with RW and min A increased time and some assist for weight shifts especially initially.  Performed seated rows with  orange t-band x 20, horizontal diagonal shoulder flexion x 10 each with cues, and horizontal abduction bilateral x 10 with band as well.  Assisted to room in w/c, sit to stand and stepping to bed with RW and min A increased time.  Sit to supine with mod A for LE's scooting up in bed with min A and rails.  Performed in bed AP's, heel slides, SAQ, hip abduction, adductor squeezes and bridging x 10. Left in supine with bed alarm active and NT in the room, call bell in reach.  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, pelvic pain, [place] - needs translator Restrictions Weight Bearing Restrictions: No RLE Weight Bearing: Non weight bearing Other Position/Activity Restrictions: WBAT Pain: Pain Assessment Pain Score: 9  Faces Pain Scale: Hurts a little bit Pain Type: Acute pain Pain Location: Groin Pain Orientation: Right Pain Descriptors / Indicators: Grimacing;Discomfort Pain Onset: With Activity Pain Intervention(s): Repositioned;Rest   Therapy/Group: Individual Therapy  Elray Mcgregor  Sheran Lawless, PT 10/29/2020, 9:14 AM

## 2020-10-30 ENCOUNTER — Inpatient Hospital Stay (HOSPITAL_COMMUNITY): Payer: Medicare Other | Admitting: Occupational Therapy

## 2020-10-30 ENCOUNTER — Inpatient Hospital Stay (HOSPITAL_COMMUNITY): Payer: Medicare Other

## 2020-10-30 LAB — GLUCOSE, CAPILLARY: Glucose-Capillary: 93 mg/dL (ref 70–99)

## 2020-10-30 MED ORDER — OXYCODONE-ACETAMINOPHEN 5-325 MG PO TABS
1.0000 | ORAL_TABLET | Freq: Four times a day (QID) | ORAL | Status: DC | PRN
  Administered 2020-10-30: 1 via ORAL
  Filled 2020-10-30 (×2): qty 1

## 2020-10-30 NOTE — Progress Notes (Signed)
[facility] PHYSICAL MEDICINE & REHABILITATION PROGRESS NOTE   Subjective/Complaints: C/o left lower extremity pain.  No other complaints   ROS: limited by language/cognition  Objective:   No results found. No results for input(s): WBC, HGB, HCT, PLT in the last 72 hours. No results for input(s): NA, K, CL, CO2, GLUCOSE, BUN, CREATININE, CALCIUM in the last 72 hours.  Intake/Output Summary (Last 24 hours) at 10/30/2020 1424 Last data filed at 10/30/2020 1300 Gross per 24 hour  Intake 480 ml  Output --  Net 480 ml        Physical Exam: Vital Signs Blood pressure 104/60, pulse 95, temperature (!) 97.5 F (36.4 C), resp. rate 20, height 4\' 10"  (1.473 m), weight 64.6 kg, SpO2 98 %.  Gen: no distress, normal appearing HEENT: oral mucosa pink and moist, NCAT Cardio: Reg rate Chest: normal effort, normal rate of breathing Abd: soft, non-distended Ext: no edema Psych: more anxious- appears homesick per nursing? Musculoskeletal: TTP over R groin/R hip- no change- also TTP over inner thighs- stable, but CP TTP is resolved    Comments: UEs 5-/5 in RUE-  but appears 5-/5 LUE- 4/5 in LUE in same muscles tested RLE- HF 1/5? Pain; KE 2-/5, pain?, DF and PF at least 4/5, but limited by pain as well LLE- HF 2/5? Pain and weak; KE 3-/5, DF and PF 3-/5  Skin: bruising- purple/yellow on R>L inner thighs- no hematoma identified- mild swelling associated--stable to improved Neurological: Ox2  Assessment/Plan: 1. Functional deficits which require 3+ hours per day of interdisciplinary therapy in a comprehensive inpatient rehab setting.  Physiatrist is providing close team supervision and 24 hour management of active medical problems listed below.  Physiatrist and rehab team continue to assess barriers to discharge/monitor patient progress toward functional and medical goals  Care Tool:  Bathing    Body parts bathed by patient: Right arm,Left arm,Chest,Abdomen,Front perineal area,Right  upper leg,Left upper leg,Face,Right lower leg,Left lower leg   Body parts bathed by helper: Buttocks     Bathing assist Assist Level: Minimal Assistance - Patient > 75%     Upper Body Dressing/Undressing Upper body dressing   What is the patient wearing?: Pull over shirt    Upper body assist Assist Level: Minimal Assistance - Patient > 75%    Lower Body Dressing/Undressing Lower body dressing      What is the patient wearing?: Pants,Incontinence brief     Lower body assist Assist for lower body dressing: Maximal Assistance - Patient 25 - 49%     Toileting Toileting    Toileting assist Assist for toileting: Moderate Assistance - Patient 50 - 74%     Transfers Chair/bed transfer  Transfers assist     Chair/bed transfer assist level: Minimal Assistance - Patient > 75%     Locomotion Ambulation   Ambulation assist      Assist level: Minimal Assistance - Patient > 75% Assistive device: Walker-rolling Max distance: 25'   Walk 10 feet activity   Assist  Walk 10 feet activity did not occur: Safety/medical concerns  Assist level: Minimal Assistance - Patient > 75% Assistive device: Walker-rolling   Walk 50 feet activity   Assist Walk 50 feet with 2 turns activity did not occur: Safety/medical concerns         Walk 150 feet activity   Assist Walk 150 feet activity did not occur: Safety/medical concerns         Walk 10 feet on uneven surface  activity   Assist  Walk 10 feet on uneven surfaces activity did not occur: Safety/medical concerns         Wheelchair     Assist Will patient use wheelchair at discharge?: Yes Type of Wheelchair: Manual    Wheelchair assist level: Minimal Assistance - Patient > 75% Max wheelchair distance: 60    Wheelchair 50 feet with 2 turns activity    Assist    Wheelchair 50 feet with 2 turns activity did not occur: Safety/medical concerns   Assist Level: Minimal Assistance - Patient > 75%    Wheelchair 150 feet activity     Assist  Wheelchair 150 feet activity did not occur: Safety/medical concerns       Blood pressure 104/60, pulse 95, temperature (!) 97.5 F (36.4 C), resp. rate 20, height 4\' 10"  (1.473 m), weight 64.6 kg, SpO2 98 %.  Medical Problem List and Plan: 1.  Decreased functional mobility secondary to right superior and inferior pubic rami fracture nondisplaced after a fall.  Weightbearing as tolerated.  Follow-up Dr.             -patient may  shower  1/10- explained sore legs and stiffness is normal- take prn pain meds             -ELOS/Goals: 10-14 days- supervision to [facility]  1/13- D/c date moved to 1/24 due to daughter's schedule.   Continue CIR 2.  Antithrombotics: -DVT/anticoagulation: Continue Chronic Eliquis             -antiplatelet therapy: N/A 3. Pain Management: Neurontin 100 mg nightly as needed, Lidoderm patch as directed oxycodone and Zanaflex as needed- will add tramadol 50 mg q6 hours prn since pt says Percocet wears off too fast.   1/5- will add lidoderm patches 2 patches 12 hrs on;12 hrs off- 8am to 8pm  1/13-14:- pain controlled-  continue regimen. Wean Percocet to 6H PRN 4. Mood: Provide emotional support             -antipsychotic agents: N/A 5. Neuropsych: This patient is? capable of making decisions on her own behalf. 6. Skin/Wound Care: Routine skin checks 7. Fluids/Electrolytes/Nutrition: Routine in and outs with follow-up chemistries 8.  Chronic atrial fibrillation.  Continue Eliquis.  Cardiac rate controlled.  Lopressor 12.5 mg twice daily  1/9 rate up a little this morning, has frequently been close to 100   -will increase lopressor to 25mg  bid which should also help with CP  1/14- rate 95 this AM- continue regimen 9.  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  Received CIR 10/19/2016 to 11/01/2016.  Patient used a rolling  walker prior to admission. 10.  Diabetes mellitus.  Hemoglobin A1c 6.4.  Metformin resumed 500 mg twice daily  1/5- restarted metformin and stopped insulin this AM- didn't give it-   1/12- BGs 104-140- con't regimen- will change to qday for BG checks  1/13- BGs 81-140 well controlled- con't regimen 11.  Hyperlipidemia.  Pravachol 12.  GERD.  Protonix 13.  Constipation.  Colace 100 mg twice daily, MiraLAX as needed- also senokot 1 tab BID- if doesn't work, will do sorbitol tomorrow  1/5- will give sorbitol this evening  1/6- had multiple BMs last night- con't regimen  1/12- says LBM 2 days ago- but isn't eating-  1/13- now says feels constipated- says it's now 4 days since LBM - likely 3 days due to calculations yesterday- will  give sorbitol today after therapy.  14. Urinary frequency- will give purewick at night.  1/5- working well- con't purewick, but only at night.   1/6- didn't use purewick last night because frequent stools. But will nightly 15. Chest pain  1/7- gave NTG x2- asked nursing to give 1 more if possible- EKG is OK upon my reading of it- just shows Afib- Troponin done- had been ~4 a few days ago- is now 6- feeling much better with NTG- will monitor closely- if gets worse, will call Cards again.      1/9 having intermittent cp relieved by nitro.    -increase metoprolol as above   -continue prn sl nitro   -will check another EKG    -question anxiety component also    -will add small dose prn xanax  1/10- chest pain/tightness 7/10 per pt- also SOB (doesn't look SOB), and fatigued- will order another troponins and EKG and called Cards to see her, since recurrent.   1/11- Cards has ordered stress testing for this AM- is NPO for this- await results/cards input.  1/12- will optimize medical mgmt- Cards thinking about Left heart cath?? 16. Poor appetite-   1/11- will check if due to constipation- check KUB; if (-), might need to start something for appetite- like Remeron?  1/12-  will start Remeron 15 mg QHS for sleep/appetite- pt scared will get "heavy"- explained she's not eating a lot- needs to eat a little more.   1/14: continue remeron    LOS: 10 days A FACE TO FACE EVALUATION WAS PERFORMED  Clint Bolder P Camren Lipsett 10/30/2020, 2:24 PM

## 2020-10-30 NOTE — Progress Notes (Signed)
Occupational Therapy Weekly Progress Note  Patient Details  Name: [name] MRN: [id number] Date of Birth: 12/03/1940  Beginning of progress report period: 10/21/2020 End of progress report period: 10/30/2020   Patient has met 4 of 4 short term goals.    Pt has made functional progress at time of report. She mostly uses the [facility] for toileting due to pain and fatigue but has exhibited the ability to complete toilet transfers and clothing mgt with CGA. She will benefit from continued participation in skilled OT to further increase her functional independence with self care. Continue POC.     Patient continues to demonstrate the following deficits: muscle weakness, decreased cardiorespiratoy endurance, unbalanced muscle activation, decreased memory and decreased standing balance and hemiplegia and therefore will continue to benefit from skilled OT intervention to enhance overall performance with BADL.  Patient progressing toward long term goals..  Continue plan of care.  OT Short Term Goals Week 1:  OT Short Term Goal 1 (Week 1): pt will perform sit to stand with Min Lauren in prep for LB ADL OT Short Term Goal 1 - Progress (Week 1): Met OT Short Term Goal 2 (Week 1): Pt will transfer with Mod Lauren to toilet with LRAD OT Short Term Goal 2 - Progress (Week 1): Met OT Short Term Goal 3 (Week 1): Pt will perform clothing management for toilet tasks with no more than Mod Lauren OT Short Term Goal 3 - Progress (Week 1): Met OT Short Term Goal 4 (Week 1): Pt will perform LB bathing with AE PRN with Min Lauren OT Short Term Goal 4 - Progress (Week 1): Met Week 2:  OT Short Term Goal 1 (Week 2): Pt will complete sit<stand with CGA while engaged in LB dressing OT Short Term Goal 2 (Week 2): Pt will don overhead shirt with supervision assist OT Short Term Goal 3 (Week 2): Pt will participate in 1 grooming task while standing at the sink, without use of Stedy, to increase standing tolerance   Therapy  Documentation Precautions:  Precautions Precautions: Fall Precaution Comments: old L hemi, pelvic pain, [place] - needs translator Restrictions Weight Bearing Restrictions: No RLE Weight Bearing: Weight bearing as tolerated Other Position/Activity Restrictions: WBAT Pain: Pain Assessment Pain Scale: 0-10 Pain Score: 5  Pain Type: Acute pain Pain Location: Groin Pain Orientation: Right Pain Descriptors / Indicators: Discomfort Pain Onset: With Activity Pain Intervention(s): Repositioned;Rest ADL: ADL Grooming: Setup Where Assessed-Grooming: Sitting at sink Upper Body Bathing: Supervision/safety Where Assessed-Upper Body Bathing: Sitting at sink Lower Body Bathing: Moderate assistance Where Assessed-Lower Body Bathing: Sitting at sink Upper Body Dressing: Minimal assistance Where Assessed-Upper Body Dressing: Sitting at sink Lower Body Dressing: Dependent Where Assessed-Lower Body Dressing: Wheelchair Toileting: Maximal assistance Where Assessed-Toileting: Toilet,Bedside Commode Toilet Transfer: Other (comment) [name])      Therapy/Group: Individual Therapy  [name] [name] 10/30/2020, 12:31 PM

## 2020-10-30 NOTE — Progress Notes (Signed)
Physical Therapy Session Note  Patient Details  Name: [name] MRN: [id number] Date of Birth: Jul 30, 1941  Today's Date: 10/30/2020 PT Individual Time:Session1: 3545-6256; Session2: 1300-1400 PT Individual Time Calculation (min): 57 min & 60 min  Short Term Goals: Week 2:  PT Short Term Goal 1 (Week 2): Patient will ambulate 29' with CGA with RW. PT Short Term Goal 2 (Week 2): Patient will negotiate 2 steps with min to mod A with rail PT Short Term Goal 3 (Week 2): Patient will perform car transfer with mod A.  Skilled Therapeutic Interventions/Progress Updates:    Session1:Patient up in recliner.  Seen with in person [place] interpreter.  Performed sit to stand and stand pivot to w/c with RW and min A and facilitation for weight shift.  Assisted in w/c to therapy gym.  Ambulated with RW x 14' with RW and min to mod A for weight shifting to mat.  Seated on mat for trunk/core strength using red weighted ball for overhead reaching x 5, in & out x 5, hip to opposite shoulder each side x 5.  Standing reaching to R & L to give and get weighted ball with RW in front and turning head to locate therapist.  LOB posterior 2 x min A to recover.  Patient performed standing step taps to 4" step 2 x 5 reps alternating feet with bilateral UE support.  In parallel bars worked on lateral and diagonal weight shifts with mod cues and assist for improved foot clearance, decreased fall risk.  Patient c/o fatigue.  Assisted back in w/c to room and performed pivot to recliner with min A using RW.  Left seated with chair alarm active and needs in reach.   Session2:  Patient in recliner and agreeable to session.  Seen with in person [place] interpreter.  Patient stand step to w/c with RW and min A.  Propelled w/c x 60' with S increased time till fatigued.  Assisted to therapy gym and negotiated 4 (6") steps with rails and min to mod A max cues for sequence/technique.  Patient assisted in w/c to Nu Step and transferred  with min A with RW used step stool to scoot back.  Performed 6 minutes UE/LE at level 1 with two rest breaks.  Patient ambulated x 12' to [facility] and performed standing balance touching targets on screen with 1 light hand support and S to CGA 2 x 42 targets.  Patient ambulated to w/c x 20' with RW and min A.  Assisted to room in w/c and stand step to recliner min A with RW.  Left seated with chair alarm active and needs/call bell in reach.  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, pelvic pain, [place] - needs translator Restrictions Weight Bearing Restrictions: No RLE Weight Bearing: Weight bearing as tolerated Other Position/Activity Restrictions: WBAT Pain: Pain Assessment Pain Scale: 0-10 Pain Score: 5  Faces Pain Scale: Hurts whole lot Pain Type: Acute pain Pain Location: Groin Pain Orientation: Right Pain Descriptors / Indicators: Discomfort Pain Frequency: Constant Pain Onset: With Activity Patients Stated Pain Goal: 3 Pain Intervention(s): Repositioned;Rest   Therapy/Group: Individual Therapy  Elray Mcgregor  Sheran Lawless, PT 10/30/2020, 8:53 AM

## 2020-10-30 NOTE — Progress Notes (Addendum)
Occupational Therapy Session Note  Patient Details  Name: [name] MRN: [id number] Date of Birth: 1940/11/20  Today's Date: 10/30/2020 OT Individual Time: 1033-1130 OT Individual Time Calculation (min): 57 min   Short Term Goals: Week 1:  OT Short Term Goal 1 (Week 1): pt will perform sit to stand with Min A in prep for LB ADL OT Short Term Goal 2 (Week 1): Pt will transfer with Mod A to toilet with LRAD OT Short Term Goal 3 (Week 1): Pt will perform clothing management for toilet tasks with no more than Mod A OT Short Term Goal 4 (Week 1): Pt will perform LB bathing with AE PRN with Min A  Skilled Therapeutic Interventions/Progress Updates:    Pt greeted in the recliner, reporting "aching all over" but declining medicinal or hot/cold modality interventions to address. She did not want to engage in self care activities or leave the room, requesting to return to bed before OT left due to fatigue. Therefore started tx by guiding pt through UB/LB stretches to relieve muscle aches. Played meaningful [place] music to increase affect and maximize participation during session. Pt able to request a song artist she liked and hummed along to music, talked with OT about the music she requested and also about her mother. Provided her with a strap to work on gentle hamstring and dorsiflexion stretches, AAROM hip adduction/abduction bilaterally. Education provided regarding DVT prevention/LE ROM at rest. Next guided pt through bilateral UE strengthening exercises using 2# bar per pts tolerance, demonstrational cues provided for carryover of technique. She then handed the bar back to therapist and requested to return to bed. Used the [place] due to fatigue level with CGA for sit<stand. When she was transferred back to bed, pt reported wanting to remain sitting up until after lunch. While semi perched in [place], pt participated in oral care, hand washing, and hair combing to work on supported standing in front of the  sink. She transferred back to the recliner and remained sitting up, all needs within reach and chair alarm set.   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, pelvic pain, [place] - needs translator Restrictions Weight Bearing Restrictions: No RLE Weight Bearing: Weight bearing as tolerated Other Position/Activity Restrictions: WBAT ADL: ADL Grooming: Setup Where Assessed-Grooming: Sitting at sink Upper Body Bathing: Supervision/safety Where Assessed-Upper Body Bathing: Sitting at sink Lower Body Bathing: Moderate assistance Where Assessed-Lower Body Bathing: Sitting at sink Upper Body Dressing: Minimal assistance Where Assessed-Upper Body Dressing: Sitting at sink Lower Body Dressing: Dependent Where Assessed-Lower Body Dressing: Wheelchair Toileting: Maximal assistance Where Assessed-Toileting: Toilet,Bedside Commode Toilet Transfer: Other (comment) [name]) :     Therapy/Group: Individual Therapy  Nello Corro A Zakary Kimura 10/30/2020, 12:27 PM

## 2020-10-31 LAB — GLUCOSE, CAPILLARY: Glucose-Capillary: 143 mg/dL — ABNORMAL HIGH (ref 70–99)

## 2020-10-31 MED ORDER — OXYCODONE-ACETAMINOPHEN 5-325 MG PO TABS: 1.0000 | ORAL_TABLET | Freq: Three times a day (TID) | ORAL | Status: DC | PRN

## 2020-10-31 NOTE — Progress Notes (Signed)
[place] PHYSICAL MEDICINE & REHABILITATION PROGRESS NOTE   Subjective/Complaints: No complaints this morning  ROS: limited by language/cognition  Objective:   No results found. No results for input(s): WBC, HGB, HCT, PLT in the last 72 hours. No results for input(s): NA, K, CL, CO2, GLUCOSE, BUN, CREATININE, CALCIUM in the last 72 hours.  Intake/Output Summary (Last 24 hours) at 10/31/2020 1528 Last data filed at 10/31/2020 0844 Gross per 24 hour  Intake 360 ml  Output --  Net 360 ml     Physical Exam: Vital Signs Blood pressure 114/84, pulse 85, temperature 98.4 F (36.9 C), resp. rate 16, height 4\' 10"  (1.473 m), weight 64.6 kg, SpO2 96 %. Gen: no distress, normal appearing HEENT: oral mucosa pink and moist, NCAT Cardio: Reg rate Chest: normal effort, normal rate of breathing Abd: soft, non-distended Ext: no edema Psych: more anxious- appears homesick per nursing? Musculoskeletal: TTP over R groin/R hip- no change- also TTP over inner thighs- stable, but CP TTP is resolved    Comments: UEs 5-/5 in RUE-  but appears 5-/5 LUE- 4/5 in LUE in same muscles tested RLE- HF 1/5? Pain; KE 2-/5, pain?, DF and PF at least 4/5, but limited by pain as well LLE- HF 2/5? Pain and weak; KE 3-/5, DF and PF 3-/5  Skin: bruising- purple/yellow on R>L inner thighs- no hematoma identified- mild swelling associated--stable to improved Neurological: Ox2  Assessment/Plan: 1. Functional deficits which require 3+ hours per day of interdisciplinary therapy in a comprehensive inpatient rehab setting.  Physiatrist is providing close team supervision and 24 hour management of active medical problems listed below.  Physiatrist and rehab team continue to assess barriers to discharge/monitor patient progress toward functional and medical goals  Care Tool:  Bathing    Body parts bathed by patient: Right arm,Left arm,Chest,Abdomen,Front perineal area,Right upper leg,Left upper leg,Face,Right  lower leg,Left lower leg   Body parts bathed by helper: Buttocks     Bathing assist Assist Level: Minimal Assistance - Patient > 75%     Upper Body Dressing/Undressing Upper body dressing   What is the patient wearing?: Pull over shirt    Upper body assist Assist Level: Minimal Assistance - Patient > 75%    Lower Body Dressing/Undressing Lower body dressing      What is the patient wearing?: Pants,Incontinence brief     Lower body assist Assist for lower body dressing: Maximal Assistance - Patient 25 - 49%     Toileting Toileting    Toileting assist Assist for toileting: Moderate Assistance - Patient 50 - 74%     Transfers Chair/bed transfer  Transfers assist     Chair/bed transfer assist level: Minimal Assistance - Patient > 75%     Locomotion Ambulation   Ambulation assist      Assist level: Minimal Assistance - Patient > 75% Assistive device: Walker-rolling Max distance: 20'   Walk 10 feet activity   Assist  Walk 10 feet activity did not occur: Safety/medical concerns  Assist level: Minimal Assistance - Patient > 75% Assistive device: Walker-rolling   Walk 50 feet activity   Assist Walk 50 feet with 2 turns activity did not occur: Safety/medical concerns         Walk 150 feet activity   Assist Walk 150 feet activity did not occur: Safety/medical concerns         Walk 10 feet on uneven surface  activity   Assist Walk 10 feet on uneven surfaces activity did not occur: Safety/medical  concerns         Wheelchair     Assist Will patient use wheelchair at discharge?: Yes Type of Wheelchair: Manual    Wheelchair assist level: Supervision/Verbal cueing Max wheelchair distance: 60    Wheelchair 50 feet with 2 turns activity    Assist    Wheelchair 50 feet with 2 turns activity did not occur: Safety/medical concerns   Assist Level: Supervision/Verbal cueing   Wheelchair 150 feet activity     Assist   Wheelchair 150 feet activity did not occur: Safety/medical concerns       Blood pressure 114/84, pulse 85, temperature 98.4 F (36.9 C), resp. rate 16, height 4\' 10"  (1.473 m), weight 64.6 kg, SpO2 96 %.  Medical Problem List and Plan: 1.  Decreased functional mobility secondary to right superior and inferior pubic rami fracture nondisplaced after a fall.  Weightbearing as tolerated.  Follow-up Dr.             -patient may  shower  1/10- explained sore legs and stiffness is normal- take prn pain meds             -ELOS/Goals: 10-14 days- supervision to [facility]  1/13- D/c date moved to 1/24 due to daughter's schedule.   Continue CIR 2.  Antithrombotics: -DVT/anticoagulation: Continue Chronic Eliquis             -antiplatelet therapy: N/A 3. Pain Management: Neurontin 100 mg nightly as needed, Lidoderm patch as directed oxycodone and Zanaflex as needed- will add tramadol 50 mg q6 hours prn since pt says Percocet wears off too fast.   1/5- will add lidoderm patches 2 patches 12 hrs on;12 hrs off- 8am to 8pm  1/13-14:- pain controlled-  continue regimen. Wean Percocet to 6H PRN  1/15: pain is well controlled: wean Percocet to q8H prn 4. Mood: Provide emotional support             -antipsychotic agents: N/A 5. Neuropsych: This patient is? capable of making decisions on her own behalf. 6. Skin/Wound Care: Routine skin checks 7. Fluids/Electrolytes/Nutrition: Routine in and outs with follow-up chemistries 8.  Chronic atrial fibrillation.  Continue Eliquis.  Cardiac rate controlled.  Lopressor 12.5 mg twice daily  1/9 rate up a little this morning, has frequently been close to 100   -will increase lopressor to 25mg  bid which should also help with CP  1/15: HR well controlled- continue lopressor 9.  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  Received CIR 10/19/2016 to 11/01/2016.  Patient used a  rolling walker prior to admission. 10.  Diabetes mellitus.  Hemoglobin A1c 6.4.  Metformin resumed 500 mg twice daily  1/5- restarted metformin and stopped insulin this AM- didn't give it-   1/12- BGs 104-140- con't regimen- will change to qday for BG checks  1/15- BGs 88-143: well controlled- continueregimen 11.  Hyperlipidemia.  Pravachol 12.  GERD.  Protonix 13.  Constipation.  Colace 100 mg twice daily, MiraLAX as needed- also senokot 1 tab BID- if doesn't work, will do sorbitol tomorrow  1/5- will give sorbitol this evening  1/6- had multiple BMs last night- con't regimen  1/12- says LBM 2 days ago- but isn't eating-  1/13- now says feels constipated- says it's now 4 days since LBM - likely 3 days due to calculations yesterday- will give sorbitol today after therapy.  14. Urinary frequency- will give  purewick at night.  1/5- working well- con't purewick, but only at night.   1/6- didn't use purewick last night because frequent stools. But will nightly 15. Chest pain  1/7- gave NTG x2- asked nursing to give 1 more if possible- EKG is OK upon my reading of it- just shows Afib- Troponin done- had been ~4 a few days ago- is now 6- feeling much better with NTG- will monitor closely- if gets worse, will call Cards again.      1/9 having intermittent cp relieved by nitro.    -increase metoprolol as above   -continue prn sl nitro   -will check another EKG    -question anxiety component also    -will add small dose prn xanax  1/10- chest pain/tightness 7/10 per pt- also SOB (doesn't look SOB), and fatigued- will order another troponins and EKG and called Cards to see her, since recurrent.   1/11- Cards has ordered stress testing for this AM- is NPO for this- await results/cards input.  1/12- will optimize medical mgmt- Cards thinking about Left heart cath?? 16. Poor appetite-   1/11- will check if due to constipation- check KUB; if (-), might need to start something for appetite- like  Remeron?  1/12- will start Remeron 15 mg QHS for sleep/appetite- pt scared will get "heavy"- explained she's not eating a lot- needs to eat a little more.   1/14: continue remeron    LOS: 11 days A FACE TO FACE EVALUATION WAS PERFORMED  Clint Bolder P Zeta Bucy 10/31/2020, 3:28 PM

## 2020-11-01 ENCOUNTER — Inpatient Hospital Stay (HOSPITAL_COMMUNITY): Payer: Medicare Other

## 2020-11-01 LAB — GLUCOSE, CAPILLARY: Glucose-Capillary: 133 mg/dL — ABNORMAL HIGH (ref 70–99)

## 2020-11-01 MED ORDER — OXYCODONE-ACETAMINOPHEN 5-325 MG PO TABS
1.0000 | ORAL_TABLET | Freq: Two times a day (BID) | ORAL | Status: DC | PRN
  Administered 2020-11-01 – 2020-11-05 (×6): 1 via ORAL
  Filled 2020-11-01 (×6): qty 1

## 2020-11-01 NOTE — Progress Notes (Signed)
[place] PHYSICAL MEDICINE & REHABILITATION PROGRESS NOTE   Subjective/Complaints: [name] has no complaints this morning.  Sitting up in chair comfortably.  Aide is fastening her safety belt.  Pleasant and makes good eye contact.  ROS: limited by language/cognition  Objective:   No results found. No results for input(s): WBC, HGB, HCT, PLT in the last 72 hours. No results for input(s): NA, K, CL, CO2, GLUCOSE, BUN, CREATININE, CALCIUM in the last 72 hours.  Intake/Output Summary (Last 24 hours) at 11/01/2020 1036 Last data filed at 11/01/2020 0500 Gross per 24 hour  Intake 517 ml  Output --  Net 517 ml     Physical Exam: Vital Signs Blood pressure 110/62, pulse 71, temperature 98.2 F (36.8 C), temperature source Oral, resp. rate 17, height 4\' 10"  (1.473 m), weight 64.6 kg, SpO2 100 %. Gen: no distress, normal appearing HEENT: oral mucosa pink and moist, NCAT Cardio: Reg rate Chest: normal effort, normal rate of breathing Abd: soft, non-distended Ext: no edema Psych: more anxious- appears homesick per nursing? Musculoskeletal: TTP over R groin/R hip- no change- also TTP over inner thighs- stable, but CP TTP is resolved    Comments: UEs 5-/5 in RUE-  but appears 5-/5 LUE- 4/5 in LUE in same muscles tested RLE- HF 1/5? Pain; KE 2-/5, pain?, DF and PF at least 4/5, but limited by pain as well LLE- HF 2/5? Pain and weak; KE 3-/5, DF and PF 3-/5  Skin: bruising- purple/yellow on R>L inner thighs- no hematoma identified- mild swelling associated--stable to improved Neurological: Ox2  Assessment/Plan: 1. Functional deficits which require 3+ hours per day of interdisciplinary therapy in a comprehensive inpatient rehab setting.  Physiatrist is providing close team supervision and 24 hour management of active medical problems listed below.  Physiatrist and rehab team continue to assess barriers to discharge/monitor patient progress toward functional and medical  goals  Care Tool:  Bathing    Body parts bathed by patient: Right arm,Left arm,Chest,Abdomen,Front perineal area,Right upper leg,Left upper leg,Face,Right lower leg,Left lower leg   Body parts bathed by helper: Buttocks     Bathing assist Assist Level: Minimal Assistance - Patient > 75%     Upper Body Dressing/Undressing Upper body dressing   What is the patient wearing?: Pull over shirt    Upper body assist Assist Level: Minimal Assistance - Patient > 75%    Lower Body Dressing/Undressing Lower body dressing      What is the patient wearing?: Pants,Incontinence brief     Lower body assist Assist for lower body dressing: Maximal Assistance - Patient 25 - 49%     Toileting Toileting    Toileting assist Assist for toileting: Moderate Assistance - Patient 50 - 74%     Transfers Chair/bed transfer  Transfers assist     Chair/bed transfer assist level: Minimal Assistance - Patient > 75%     Locomotion Ambulation   Ambulation assist      Assist level: Minimal Assistance - Patient > 75% Assistive device: Walker-rolling Max distance: 20'   Walk 10 feet activity   Assist  Walk 10 feet activity did not occur: Safety/medical concerns  Assist level: Minimal Assistance - Patient > 75% Assistive device: Walker-rolling   Walk 50 feet activity   Assist Walk 50 feet with 2 turns activity did not occur: Safety/medical concerns         Walk 150 feet activity   Assist Walk 150 feet activity did not occur: Safety/medical concerns  Walk 10 feet on uneven surface  activity   Assist Walk 10 feet on uneven surfaces activity did not occur: Safety/medical concerns         Wheelchair     Assist Will patient use wheelchair at discharge?: Yes Type of Wheelchair: Manual    Wheelchair assist level: Supervision/Verbal cueing Max wheelchair distance: 60    Wheelchair 50 feet with 2 turns activity    Assist    Wheelchair 50 feet  with 2 turns activity did not occur: Safety/medical concerns   Assist Level: Supervision/Verbal cueing   Wheelchair 150 feet activity     Assist  Wheelchair 150 feet activity did not occur: Safety/medical concerns       Blood pressure 110/62, pulse 71, temperature 98.2 F (36.8 C), temperature source Oral, resp. rate 17, height 4\' 10"  (1.473 m), weight 64.6 kg, SpO2 100 %.  Medical Problem List and Plan: 1.  Decreased functional mobility secondary to right superior and inferior pubic rami fracture nondisplaced after a fall.  Weightbearing as tolerated.  Follow-up Dr.             -patient may  shower  1/10- explained sore legs and stiffness is normal- take prn pain meds             -ELOS/Goals: 10-14 days- supervision to [facility]  1/13- D/c date moved to 1/24 due to daughter's schedule.   Continue CIR 2.  Antithrombotics: -DVT/anticoagulation: Continue chronic Eliquis             -antiplatelet therapy: N/A 3. Pain Management: Neurontin 100 mg nightly as needed, Lidoderm patch as directed oxycodone and Zanaflex as needed- will add tramadol 50 mg q6 hours prn since pt says Percocet wears off too fast.   1/5- will add lidoderm patches 2 patches 12 hrs on;12 hrs off- 8am to 8pm  1/13-14:- pain controlled-  continue regimen. Wean Percocet to 6H PRN  1/15: pain is well controlled: wean Percocet to q8H prn  1/16: Has not required percocet in last 24 hours, wean to q12H prn 4. Mood: Provide emotional support             -antipsychotic agents: N/A 5. Neuropsych: This patient is? capable of making decisions on her own behalf. 6. Skin/Wound Care: Routine skin checks 7. Fluids/Electrolytes/Nutrition: Routine in and outs with follow-up chemistries 8.  Chronic atrial fibrillation.  Continue Eliquis.  Cardiac rate controlled.  Lopressor 12.5 mg twice daily  1/9 rate up a little this morning, has frequently been close to 100   -will increase lopressor to 25mg  bid which should also help with  CP  1/15-1/16: HR well controlled- continue lopressor 9.  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  Received CIR 10/19/2016 to 11/01/2016.  Patient used a rolling walker prior to admission. 10.  Diabetes mellitus.  Hemoglobin A1c 6.4.  Metformin resumed 500 mg twice daily  1/5- restarted metformin and stopped insulin this AM- didn't give it-   1/12- BGs 104-140- con't regimen- will change to qday for BG checks  1/15- BGs 88-143: well controlled- continueregimen 11.  Hyperlipidemia.  Pravachol 12.  GERD.  Protonix 13.  Constipation.  Colace 100 mg twice daily, MiraLAX as needed- also senokot 1 tab BID- if doesn't work, will do sorbitol tomorrow  1/5- will give sorbitol this evening  1/6- had multiple BMs last night- con't regimen  1/12- says LBM  2 days ago- but isn't eating-  1/13- now says feels constipated- says it's now 4 days since LBM - likely 3 days due to calculations yesterday- will give sorbitol today after therapy.  14. Urinary frequency- will give purewick at night.  1/5- working well- con't purewick, but only at night.   1/6- didn't use purewick last night because frequent stools. But will nightly 15. Chest pain  1/7- gave NTG x2- asked nursing to give 1 more if possible- EKG is OK upon my reading of it- just shows Afib- Troponin done- had been ~4 a few days ago- is now 6- feeling much better with NTG- will monitor closely- if gets worse, will call Cards again.      1/9 having intermittent cp relieved by nitro.    -increase metoprolol as above   -continue prn sl nitro   -will check another EKG    -question anxiety component also    -will add small dose prn xanax  1/10- chest pain/tightness 7/10 per pt- also SOB (doesn't look SOB), and fatigued- will order another troponins and EKG and called Cards to see her, since recurrent.   1/11- Cards has ordered stress testing for this AM- is  NPO for this- await results/cards input.  1/12- will optimize medical mgmt- Cards thinking about Left heart cath?? 16. Poor appetite-   1/11- will check if due to constipation- check KUB; if (-), might need to start something for appetite- like Remeron?  1/12- will start Remeron 15 mg QHS for sleep/appetite- pt scared will get "heavy"- explained she's not eating a lot- needs to eat a little more.   1/14: continue remeron    LOS: 12 days A FACE TO FACE EVALUATION WAS PERFORMED  Drema Pry Jessikah Dicker 11/01/2020, 10:36 AM

## 2020-11-01 NOTE — Progress Notes (Signed)
Physical Therapy Session Note  Patient Details  Name: [name] MRN: [id number] Date of Birth: 04/22/1941  Today's Date: 11/01/2020 PT Individual Time: 1100-1200 PT Individual Time Calculation (min): 60 min   Short Term Goals: Week 1:  PT Short Term Goal 1 (Week 1): Patient to perform bed mobility with min to mod A. PT Short Term Goal 1 - Progress (Week 1): Met PT Short Term Goal 2 (Week 1): Patient to perform sit to stand with min to mod A. PT Short Term Goal 2 - Progress (Week 1): Met PT Short Term Goal 3 (Week 1): Patient to ambulate 30' with RW and min A. PT Short Term Goal 3 - Progress (Week 1): Met PT Short Term Goal 4 (Week 1): Patient to perform standing activity with 1 UE support and min A for improved balance. PT Short Term Goal 4 - Progress (Week 1): Met Week 2:  PT Short Term Goal 1 (Week 2): Patient will ambulate 50' with CGA with RW. PT Short Term Goal 2 (Week 2): Patient will negotiate 2 steps with min to mod A with rail PT Short Term Goal 3 (Week 2): Patient will perform car transfer with mod A.  Skilled Therapeutic Interventions/Progress Updates:   Received pt supine in bed, pt agreeable to therapy, and reported pain 6/10 in groin (premedicated). Repositioning and rest breaks done to reduce pain levels. Virtual interpreter service used during session. Session with emphasis on functional mobility/transfers, generalized strengthening, dynamic standing balance/coordination, ambulation, and improved activity tolerance. Pt transferred supine<>sitting EOB with mod A and reported urge to use restroom but declined ambulating. Pt transferred bed<>WC stand<>pivot with RW and min/mod A and stand<>pivot WC<>toilet with bedside commode over top with min A. Pt required min/mod A for clothing management and able to void. Pt transferred sit<>stand with RW and min A and able to perform peri-care with CGA. Returned to sitting as pt expressed continued urge to void. Pt sat in WC and washed hands  at sink with supervision. Pt transported to therapy gym in WC total A and ambulated 3ft with RW and min A. Pt then reported increased bilateral thigh/groin pain and requested to sit. Pt transferred stand<>pivot WC<>mat with RW and min A. Worked on dynamic standing balance tossing horseshoes with RW and CGA for balance x 3 trials. Pt performed toe taps to 1 3/4 in step x15 on LLE and x10 on RLE with BUE support and CGA for balance. Pt unable to lift RLE to perform on 6in step. Stopped due to pain and pt reports of feeling off balance. Stand<>pivot mat<>WC with RW and min A. Pt transported back to room in WC total A. Pt reported urge to urinate again (see earlier in note). Stand<>pivot WC<>recliner with RW and min A. Concluded session with pt sitting in recliner, needs within reach, and seatbelt alarm on. RN present administering medications.   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  

## 2020-11-02 ENCOUNTER — Inpatient Hospital Stay (HOSPITAL_COMMUNITY): Payer: Medicare Other | Admitting: Occupational Therapy

## 2020-11-02 ENCOUNTER — Inpatient Hospital Stay (HOSPITAL_COMMUNITY): Payer: Medicare Other | Admitting: Physical Therapy

## 2020-11-02 LAB — GLUCOSE, CAPILLARY
Glucose-Capillary: 106 mg/dL — ABNORMAL HIGH (ref 70–99)
Glucose-Capillary: 62 mg/dL — ABNORMAL LOW (ref 70–99)

## 2020-11-02 NOTE — Progress Notes (Signed)
[facility] PHYSICAL MEDICINE & REHABILITATION PROGRESS NOTE   Subjective/Complaints:  Per teleinterpretor, pt reports legs aching- also little hard to breathe-but is chronic- x 2 years- no change.  Doesn't remember when last had CP.  LBM going 1x/day.    ROS:  Limited due to cognition  Objective:   No results found. No results for input(s): WBC, HGB, HCT, PLT in the last 72 hours. No results for input(s): NA, K, CL, CO2, GLUCOSE, BUN, CREATININE, CALCIUM in the last 72 hours.  Intake/Output Summary (Last 24 hours) at 11/02/2020 0934 Last data filed at 11/01/2020 1859 Gross per 24 hour  Intake 277 ml  Output -  Net 277 ml     Physical Exam: Vital Signs Blood pressure 107/67, pulse 74, temperature 97.7 F (36.5 C), resp. rate 15, height 4\' 10"  (1.473 m), weight 64.6 kg, SpO2 99 %. Gen: Pt sitting up in w/c- was at sink, but moved into room, OT in room, NAD HEENT: oral mucosa pink and moist, NCAT Cardio: irregular rate; rate controlled Chest: CTA B/L- no W/R/R- good air movement Abd: Soft, NT, ND, (+)BS  Ext: no edema Psych: less anxious than normal Musculoskeletal: TTP over R groin/R hip- no change- also TTP over inner thighs- stable, but CP TTP is resolved    Comments: UEs 5-/5 in RUE-  but appears 5-/5 LUE- 4/5 in LUE in same muscles tested RLE- HF 1/5? Pain; KE 2-/5, pain?, DF and PF at least 4/5, but limited by pain as well LLE- HF 2/5? Pain and weak; KE 3-/5, DF and PF 3-/5  Skin: bruising- purple/yellow on R>L inner thighs- no hematoma identified- mild swelling associated--stable to improved Neurological: Ox2  Assessment/Plan: 1. Functional deficits which require 3+ hours per day of interdisciplinary therapy in a comprehensive inpatient rehab setting.  Physiatrist is providing close team supervision and 24 hour management of active medical problems listed below.  Physiatrist and rehab team continue to assess barriers to discharge/monitor patient progress toward  functional and medical goals  Care Tool:  Bathing    Body parts bathed by patient: Right arm,Left arm,Chest,Abdomen,Front perineal area,Right upper leg,Left upper leg,Face,Right lower leg,Left lower leg   Body parts bathed by helper: Buttocks     Bathing assist Assist Level: Minimal Assistance - Patient > 75%     Upper Body Dressing/Undressing Upper body dressing   What is the patient wearing?: Pull over shirt    Upper body assist Assist Level: Minimal Assistance - Patient > 75%    Lower Body Dressing/Undressing Lower body dressing      What is the patient wearing?: Pants,Incontinence brief     Lower body assist Assist for lower body dressing: Maximal Assistance - Patient 25 - 49%     Toileting Toileting    Toileting assist Assist for toileting: Moderate Assistance - Patient 50 - 74%     Transfers Chair/bed transfer  Transfers assist     Chair/bed transfer assist level: Minimal Assistance - Patient > 75%     Locomotion Ambulation   Ambulation assist      Assist level: Minimal Assistance - Patient > 75% Assistive device: Walker-rolling Max distance: 20'   Walk 10 feet activity   Assist  Walk 10 feet activity did not occur: Safety/medical concerns  Assist level: Minimal Assistance - Patient > 75% Assistive device: Walker-rolling   Walk 50 feet activity   Assist Walk 50 feet with 2 turns activity did not occur: Safety/medical concerns  Walk 150 feet activity   Assist Walk 150 feet activity did not occur: Safety/medical concerns         Walk 10 feet on uneven surface  activity   Assist Walk 10 feet on uneven surfaces activity did not occur: Safety/medical concerns         Wheelchair     Assist Will patient use wheelchair at discharge?: Yes Type of Wheelchair: Manual    Wheelchair assist level: Supervision/Verbal cueing Max wheelchair distance: 60    Wheelchair 50 feet with 2 turns activity    Assist     Wheelchair 50 feet with 2 turns activity did not occur: Safety/medical concerns   Assist Level: Supervision/Verbal cueing   Wheelchair 150 feet activity     Assist  Wheelchair 150 feet activity did not occur: Safety/medical concerns       Blood pressure 107/67, pulse 74, temperature 97.7 F (36.5 C), resp. rate 15, height 4\' 10"  (1.473 m), weight 64.6 kg, SpO2 99 %.  Medical Problem List and Plan: 1.  Decreased functional mobility secondary to right superior and inferior pubic rami fracture nondisplaced after a fall.  Weightbearing as tolerated.  Follow-up Dr.             -patient may  shower  1/10- explained sore legs and stiffness is normal- take prn pain meds             -ELOS/Goals: 10-14 days- supervision to [facility]  1/13- D/c date moved to 1/24 due to daughter's schedule.   Continue CIR 2.  Antithrombotics: -DVT/anticoagulation: Continue chronic Eliquis             -antiplatelet therapy: N/A 3. Pain Management: Neurontin 100 mg nightly as needed, Lidoderm patch as directed oxycodone and Zanaflex as needed- will add tramadol 50 mg q6 hours prn since pt says Percocet wears off too fast.   1/5- will add lidoderm patches 2 patches 12 hrs on;12 hrs off- 8am to 8pm  1/13-14:- pain controlled-  continue regimen. Wean Percocet to [facility] PRN  1/15: pain is well controlled: wean Percocet to q8H prn  1/16: Has not required percocet in last 24 hours, wean to q12H prn  1/17- pt c/o aching legs- con't tramadol prn 4. Mood: Provide emotional support             -antipsychotic agents: N/A 5. Neuropsych: This patient is? capable of making decisions on her own behalf. 6. Skin/Wound Care: Routine skin checks 7. Fluids/Electrolytes/Nutrition: Routine in and outs with follow-up chemistries 8.  Chronic atrial fibrillation.  Continue Eliquis.  Cardiac rate controlled.  Lopressor 12.5 mg twice daily  1/9 rate up a little this morning, has frequently been close to 100   -will increase  lopressor to 25mg  bid which should also help with CP  1/15-1/16: HR well controlled- continue lopressor  1/17- HR 75 - con't regimen 9.  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  Received CIR 10/19/2016 to 11/01/2016.  Patient used a rolling walker prior to admission. 10.  Diabetes mellitus.  Hemoglobin A1c 6.4.  Metformin resumed 500 mg twice daily  1/5- restarted metformin and stopped insulin this AM- didn't give it-   1/17- BGs-  93- 143- con't regimen 11.  Hyperlipidemia.  Pravachol 12.  GERD.  Protonix 13.  Constipation.  Colace 100 mg twice daily, MiraLAX as needed- also senokot 1 tab BID- if doesn't  work, will do sorbitol tomorrow  1/5- will give sorbitol this evening  1/6- had multiple BMs last night- con't regimen  1/12- says LBM 2 days ago- but isn't eating-  1/13- now says feels constipated- says it's now 4 days since LBM - likely 3 days due to calculations yesterday- will give sorbitol today after therapy.  14. Urinary frequency- will give purewick at night.  1/5- working well- con't purewick, but only at night.   1/6- didn't use purewick last night because frequent stools. But will nightly  1/17- asked to go to bathroom q20 minutes per staff;  15. Chest pain  1/7- gave NTG x2- asked nursing to give 1 more if possible- EKG is OK upon my reading of it- just shows Afib- Troponin done- had been ~4 a few days ago- is now 6- feeling much better with NTG- will monitor closely- if gets worse, will call Cards again.      1/9 having intermittent cp relieved by nitro.    -increase metoprolol as above   -continue prn sl nitro   -will check another EKG    -question anxiety component also    -will add small dose prn xanax  1/10- chest pain/tightness 7/10 per pt- also SOB (doesn't look SOB), and fatigued- will order another troponins and EKG and called Cards to see her, since recurrent.    1/11- Cards has ordered stress testing for this AM- is NPO for this- await results/cards input.  1/12- will optimize medical mgmt- Cards thinking about Left heart cath??  1/17- Cards appeared to have signed off- no more Chest pain- does c/o mild SOB, but appears comfortable and O2 sats look good- con't regimen 16. Poor appetite-   1/11- will check if due to constipation- check KUB; if (-), might need to start something for appetite- like Remeron?  1/12- will start Remeron 15 mg QHS for sleep/appetite- pt scared will get "heavy"- explained she's not eating a lot- needs to eat a little more.   1/14: continue remeron    LOS: 13 days A FACE TO FACE EVALUATION WAS PERFORMED  Riddhi Grether 11/02/2020, 9:34 AM

## 2020-11-02 NOTE — Progress Notes (Signed)
Physical Therapy Session Note  Patient Details  Name: [name] MRN: [id number] Date of Birth: Apr 08, 1941  Today's Date: 11/02/2020 PT Individual Time: 4536-4680 PT Individual Time Calculation (min): 57 min   Short Term Goals: Week 1:  PT Short Term Goal 1 (Week 1): Patient to perform bed mobility with min to mod A. PT Short Term Goal 1 - Progress (Week 1): Met PT Short Term Goal 2 (Week 1): Patient to perform sit to stand with min to mod A. PT Short Term Goal 2 - Progress (Week 1): Met PT Short Term Goal 3 (Week 1): Patient to ambulate 68' with RW and min A. PT Short Term Goal 3 - Progress (Week 1): Met PT Short Term Goal 4 (Week 1): Patient to perform standing activity with 1 UE support and min A for improved balance. PT Short Term Goal 4 - Progress (Week 1): Met Week 2:  PT Short Term Goal 1 (Week 2): Patient will ambulate 67' with CGA with RW. PT Short Term Goal 2 (Week 2): Patient will negotiate 2 steps with min to mod A with rail PT Short Term Goal 3 (Week 2): Patient will perform car transfer with mod A.      Skilled Therapeutic Interventions/Progress Updates:    pt received in recliner agreeable to therapy. PT setup translator for communication at start of session. Pt reported pain in R knee but did not rank. Pt taken to gym total A for time and energy. Pt directed in seated BLE strengthening exercises with 2# marching at 30-50% range, LAQ, and hip abduction and adduction 2x10 each with visual targets to complete. Pt directed in x5 Sit to stand with Rolling walker at min A with VC for hand placement and posture throughout. Pt reported dizziness with first rep of standing but reported resolved with extra time and VC breathing technique. Pt directed in Sit to stand to Rolling walker min A for standing weight shifts R<>L with VC and visual demo to complete min A for stability, with this improving, pt progressed to slight standing marches with min A for stability 2x8 each. Pt taken back to  room, total A for time and energy. Pt requested to return to bed to rest, denied increased pain but reported fatigue. Pt directed in Stand pivot transfer to bedside at mod A with slow technique to complete and VC throughout for safety; sit>supine min A. Pt left in bed, alarm set, All needs in reach and in good condition. Call light in hand.    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, pelvic pain, [place] - needs translator Restrictions Weight Bearing Restrictions: No RLE Weight Bearing: Weight bearing as tolerated Other Position/Activity Restrictions: WBAT General:   Vital Signs: Therapy Vitals Pulse Rate: 83 BP: 103/62 Pain: Pain Assessment Pain Scale: 0-10 Pain Score: 4  Faces Pain Scale: Hurts a little bit Pain Type: Chronic pain Pain Location: Leg Pain Orientation: Right Pain Descriptors / Indicators: Aching Pain Frequency: Intermittent Pain Onset: With Activity Patients Stated Pain Goal: 4 Pain Intervention(s): Medication (See eMAR) Multiple Pain Sites: No Mobility:   Locomotion :    Trunk/Postural Assessment :    Balance:   Exercises:   Other Treatments:      Therapy/Group: Individual Therapy  [name] 11/02/2020, 11:45 AM

## 2020-11-02 NOTE — Progress Notes (Signed)
Occupational Therapy Session Note  Patient Details  Name: [name] MRN: [id number] Date of Birth: 20-Dec-1940  Today's Date: 11/02/2020 OT Individual Time: 7616-0737 OT Individual Time Calculation (min): 54 min    Short Term Goals: Week 2:  OT Short Term Goal 1 (Week 2): Pt will complete sit<stand with CGA while engaged in LB dressing OT Short Term Goal 2 (Week 2): Pt will don overhead shirt with supervision assist OT Short Term Goal 3 (Week 2): Pt will participate in 1 grooming task while standing at the sink, without use of Stedy, to increase standing tolerance   Skilled Therapeutic Interventions/Progress Updates:    Pt greeted at time of session on commode with Stedy, OT to resume care. Stedy transfer toilet > w/c with Stedy as it was already being used. Once in wheelchair, set up at sink and washed hands and removed dentures with set up. MD entered at this time, speaking with pt via mobile translator who remained to translate throughout session. Pt wanting to shower, stand pivot wheelchair > TTB in shower with Min/CGA with extended time and RW. UB/LB bathing at shower level Min A overall for washing buttocks in standing with pt holding grab bar but pt noted later to stand at RW and dry buttocks without help. Use of LHS to wash feet/back but requesting assist for thoroughness. Extended time for all transitions d/t pain in R pelvis, no # given for pain but rest breaks PRN. Donned shirt Min A to fully pull down in the back prior to stand pivot back to wheelchair CGA/Min. LB dress underwear and pants with Mod A assist to thread and pt able to static stand to don over hips. Assisted with drying hair as well per request. Pt set up in recliner with alarm on, call bell in reach.   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

## 2020-11-02 NOTE — Progress Notes (Signed)
Physical Therapy Session Note  Patient Details  Name: [name] MRN: [id number] Date of Birth: 08/08/1941  Today's Date: 11/02/2020 PT Individual Time: 2409-7353 PT Individual Time Calculation (min): 72 min   Short Term Goals: Week 1:  PT Short Term Goal 1 (Week 1): Patient to perform bed mobility with min to mod A. PT Short Term Goal 1 - Progress (Week 1): Met PT Short Term Goal 2 (Week 1): Patient to perform sit to stand with min to mod A. PT Short Term Goal 2 - Progress (Week 1): Met PT Short Term Goal 3 (Week 1): Patient to ambulate 77' with RW and min A. PT Short Term Goal 3 - Progress (Week 1): Met PT Short Term Goal 4 (Week 1): Patient to perform standing activity with 1 UE support and min A for improved balance. PT Short Term Goal 4 - Progress (Week 1): Met Week 2:  PT Short Term Goal 1 (Week 2): Patient will ambulate 43' with CGA with RW. PT Short Term Goal 2 (Week 2): Patient will negotiate 2 steps with min to mod A with rail PT Short Term Goal 3 (Week 2): Patient will perform car transfer with mod A.  Skilled Therapeutic Interventions/Progress Updates:    pt received in recliner and agreeable to therapy. Pt directed in Sit to stand  min A; Stand pivot transfer to WC at min A with Rolling walker extra time to complete. Pt reported she needed to use restroom and directed in gait training with Rolling walker to toilet min A for stability and VC and visual demonstration for technique with good effect. Pt transferred to and from toilet min A, void of bladder and CGA for hygiene, pt directed in gait training to return to [facility] total of 10'x2. Pt taken to gym total A for time and energy. Pt directed in dynamic standing balance with intermittent UE support for card matching by suites with pt requiring seated rest break at 4 mins 2/2 fatigue completed 3/4 card deck post rest break able to complete deck in 3 mins grossly  CGA throughout with few VC to complete and min A for Sit to stand. Pt  directed in x5 Sit to stand to Rolling walker min A improved to CGA with extra time to complete good recall of hand placement. Pt then directed in gait training with Rolling walker 30' min A with turns and CGA for straight path. Translator used to insure no pain or other symptoms, pt denied all by fatigue. Pt directed in BUE strengthening exercises x10 of bicep curls, OH press, chest press with rest breaks between 2# arm bar, visual demo for improved technique and attention to task. Pt returned to room, requested to remain in recliner, directed in Stand pivot transfer to return to recliner min A with Rolling walker. Pt left in recliner, alarm belt set, All needs in reach and in good condition. Call light in hand.  Pt did not rank pain but reported "some" pain in RLE and said yes with translator to confirm pt requesting pain medication. Nursing made aware.   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

## 2020-11-03 ENCOUNTER — Inpatient Hospital Stay (HOSPITAL_COMMUNITY): Payer: Medicare Other | Admitting: Physical Therapy

## 2020-11-03 ENCOUNTER — Inpatient Hospital Stay (HOSPITAL_COMMUNITY): Payer: Medicare Other | Admitting: Occupational Therapy

## 2020-11-03 LAB — GLUCOSE, CAPILLARY: Glucose-Capillary: 81 mg/dL (ref 70–99)

## 2020-11-03 NOTE — Progress Notes (Signed)
Patient ID: [name], female   DOB: 07-08-1941, 80 y.o.   MRN: [id number]  Spoke with daughter regarding team conference progress this week in therapies. Still planning on discharge 1/24. Daughter's FMLA starts then. Awaiting daughter's preference for home health agency.

## 2020-11-03 NOTE — Progress Notes (Addendum)
Physical Therapy Session Note  Patient Details  Name: [name] MRN: [id number] Date of Birth: [birth year]  Today's Date: 11/03/2020 PT Individual Time: 1497-0263 and 7858-8502 PT Individual Time Calculation (min): 59 min and 55 mins  Short Term Goals: Week 1:  PT Short Term Goal 1 (Week 1): Patient to perform bed mobility with min to mod A. PT Short Term Goal 1 - Progress (Week 1): Met PT Short Term Goal 2 (Week 1): Patient to perform sit to stand with min to mod A. PT Short Term Goal 2 - Progress (Week 1): Met PT Short Term Goal 3 (Week 1): Patient to ambulate 2' with RW and min A. PT Short Term Goal 3 - Progress (Week 1): Met PT Short Term Goal 4 (Week 1): Patient to perform standing activity with 1 UE support and min A for improved balance. PT Short Term Goal 4 - Progress (Week 1): Met Week 2:  PT Short Term Goal 1 (Week 2): Patient will ambulate 82' with CGA with RW. PT Short Term Goal 2 (Week 2): Patient will negotiate 2 steps with min to mod A with rail PT Short Term Goal 3 (Week 2): Patient will perform car transfer with mod A.  Skilled Therapeutic Interventions/Progress Updates:    session 1: pt received in recliner and agreeable to therapy. Pt directed in Stand pivot transfer with Rolling walker to WC at min A. Pt taken to gym in [facility] total A for time and energy. Pt directed in gait training with Rolling walker for 30' x2 at min A with visual demo for improved step length on LLE for overall gait pattern but pt reported limited 2/2 pain in BLE. Did not rank pain. Pt directed in multiple Sit to stand from [facility] to Rolling walker at min A grossly with good recall of hand placement this date. Pt directed in standing activity with matching blocks to improved standing balance, tolerance to standing activity with minimal UE support at walker or tabletop to improve balance righting reactions and decrease fall risk, pt stood for 5 mins and 4 mins at [facility] with intermittent one UE support however able  to standing majority of time without external support. Pt directed in seated BLE strengthening exercises with 2# for marching, LAQ 2x10 each and BUE strengthening exercises with 2# arm bar for bicep curls, chest press, overhead press x10 each. Pt required rest breaks post each set 2/2 fatigue but denied pain. Pt directed in x5 Sit to stand from [facility] to Rolling walker at min A improving to CGA with reps. Pt returned to room in [facility], total A for time and energy conservation. Pt requested to return to bed to rest at end of session and directed in Stand pivot transfer min A to bed with Rolling walker and min A for sit>supine for BLE management into bed. Pt left in bed, alarm set, All needs in reach and in good condition. Call light in hand.    Session 2: pt received in recliner and agreeable to therapy. In-person interpreter present for therapy session. Pt directed in Stand pivot transfer to WC min A with Rolling walker and taken to gym total A for time. Pt directed in car transfer set to pt's estimation of car height for family's SUV, mod A to complete with a lot extra time to complete, assist for BLE management, improved to min A exiting the car with extra time for rest break while in car post exit. Pt directed in gait training with Rolling walker  30' x4 with poor cadence noted and prolonged rest break at this distance in sitting to recover, VC and visual demonstration for increased step length on LLE>RLE and trunk extension. Pt reported she needed to use restroom and taken back to room in [facility], Stand pivot transfer to toilet min A, voided bladder min A for hygiene and donning pants. Pt directed in gait with Rolling walker for 10' to recliner min A and left in recliner, alarm belt set, All needs in reach and in good condition. Call light in hand.    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

## 2020-11-03 NOTE — Progress Notes (Signed)
[facility] PHYSICAL MEDICINE & REHABILITATION PROGRESS NOTE   Subjective/Complaints:  Per tele-interpretor- only has SOB when "worries too much"; same with chest pain.   LBM 3-4 days ago. Based on chart, last had BM was yesterday early AM- around midnight. Was large- prior to that previous BM was 1/13 x2 at late night.    thinks less frequent BMs due to "eating less".     ROS:  Limited due to language/cognition  Objective:   No results found. No results for input(s): WBC, HGB, HCT, PLT in the last 72 hours. No results for input(s): NA, K, CL, CO2, GLUCOSE, BUN, CREATININE, CALCIUM in the last 72 hours.  Intake/Output Summary (Last 24 hours) at 11/03/2020 0847 Last data filed at 11/03/2020 0700 Gross per 24 hour  Intake 427 ml  Output -  Net 427 ml     Physical Exam: Vital Signs Blood pressure 124/75, pulse 85, temperature (!) 97.5 F (36.4 C), temperature source Oral, resp. rate 20, height 4\' 10"  (1.473 m), weight 64.6 kg, SpO2 100 %. Gen: pt sitting up in bedside chair, pleasant, but not exactly oriented, NAD HEENT: oral mucosa pink and moist, NCAT Cardio: irregular rhythm; rate controlled Chest: CTA B/L- no W/R/R- good air movement Abd: Soft, NT, ND, (+)BS  Ext: no edema Psych:not anxious this AM Musculoskeletal: TTP over R groin/R hip- no change- also TTP over inner thighs- stable, but CP TTP is resolved    Comments: UEs 5-/5 in RUE-  but appears 5-/5 LUE- 4/5 in LUE in same muscles tested RLE- HF 1/5? Pain; KE 2-/5, pain?, DF and PF at least 4/5, but limited by pain as well LLE- HF 2/5? Pain and weak; KE 3-/5, DF and PF 3-/5  Skin: bruising- purple/yellow on R>L inner thighs- no hematoma identified- mild swelling associated--stable to improved Neurological: Ox2  Assessment/Plan: 1. Functional deficits which require 3+ hours per day of interdisciplinary therapy in a comprehensive inpatient rehab setting.  Physiatrist is providing close team supervision and 24 hour  management of active medical problems listed below.  Physiatrist and rehab team continue to assess barriers to discharge/monitor patient progress toward functional and medical goals  Care Tool:  Bathing    Body parts bathed by patient: Right arm,Left arm,Chest,Abdomen,Front perineal area,Right upper leg,Left upper leg,Face,Right lower leg,Left lower leg   Body parts bathed by helper: Buttocks     Bathing assist Assist Level: Minimal Assistance - Patient > 75%     Upper Body Dressing/Undressing Upper body dressing   What is the patient wearing?: Pull over shirt    Upper body assist Assist Level: Minimal Assistance - Patient > 75%    Lower Body Dressing/Undressing Lower body dressing      What is the patient wearing?: Pants,Incontinence brief     Lower body assist Assist for lower body dressing: Moderate Assistance - Patient 50 - 74%     Toileting Toileting    Toileting assist Assist for toileting: Moderate Assistance - Patient 50 - 74%     Transfers Chair/bed transfer  Transfers assist     Chair/bed transfer assist level: Minimal Assistance - Patient > 75%     Locomotion Ambulation   Ambulation assist      Assist level: Minimal Assistance - Patient > 75% Assistive device: Walker-rolling Max distance: 20'   Walk 10 feet activity   Assist  Walk 10 feet activity did not occur: Safety/medical concerns  Assist level: Minimal Assistance - Patient > 75% Assistive device: Walker-rolling   Walk  50 feet activity   Assist Walk 50 feet with 2 turns activity did not occur: Safety/medical concerns         Walk 150 feet activity   Assist Walk 150 feet activity did not occur: Safety/medical concerns         Walk 10 feet on uneven surface  activity   Assist Walk 10 feet on uneven surfaces activity did not occur: Safety/medical concerns         Wheelchair     Assist Will patient use wheelchair at discharge?: Yes Type of Wheelchair:  Manual    Wheelchair assist level: Supervision/Verbal cueing Max wheelchair distance: 60    Wheelchair 50 feet with 2 turns activity    Assist    Wheelchair 50 feet with 2 turns activity did not occur: Safety/medical concerns   Assist Level: Supervision/Verbal cueing   Wheelchair 150 feet activity     Assist  Wheelchair 150 feet activity did not occur: Safety/medical concerns       Blood pressure 124/75, pulse 85, temperature (!) 97.5 F (36.4 C), temperature source Oral, resp. rate 20, height 4\' 10"  (1.473 m), weight 64.6 kg, SpO2 100 %.  Medical Problem List and Plan: 1.  Decreased functional mobility secondary to right superior and inferior pubic rami fracture nondisplaced after a fall.  Weightbearing as tolerated.  Follow-up Dr.             -patient may  shower  1/10- explained sore legs and stiffness is normal- take prn pain meds             -ELOS/Goals: 10-14 days- supervision to [facility] ([facility]-Rh)  1/13- D/c date moved to 1/24 due to daughter's schedule.   Continue CIR 2.  Antithrombotics: -DVT/anticoagulation: Continue chronic Eliquis             -antiplatelet therapy: N/A 3. Pain Management: Neurontin 100 mg nightly as needed, Lidoderm patch as directed oxycodone and Zanaflex as needed- will add tramadol 50 mg q6 hours prn since pt says Percocet wears off too fast.   1/5- will add lidoderm patches 2 patches 12 hrs on;12 hrs off- 8am to 8pm  1/13-14:- pain controlled-  continue regimen. Wean Percocet to [facility] PRN  1/15: pain is well controlled: wean Percocet to q8H prn  1/16: Has not required percocet in last 24 hours, wean to q12H prn  1/18- c/o aching/painful/tired legs- con't tramadol prn 4. Mood: Provide emotional support             -antipsychotic agents: N/A 5. Neuropsych: This patient is? capable of making decisions on her own behalf. 6. Skin/Wound Care: Routine skin checks 7. Fluids/Electrolytes/Nutrition: Routine in and outs with follow-up chemistries 8.   Chronic atrial fibrillation.  Continue Eliquis.  Cardiac rate controlled.  Lopressor 12.5 mg twice daily  1/9 rate up a little this morning, has frequently been close to 100   -will increase lopressor to 25mg  bid which should also help with CP  1/18- HR 80s- con't regimen 9.  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  Received CIR 10/19/2016 to 11/01/2016.  Patient used a rolling walker prior to admission. 10.  Diabetes mellitus.  Hemoglobin A1c 6.4.  Metformin resumed 500 mg twice daily  1/5- restarted metformin and stopped insulin this AM- didn't give it-   1/17- BGs-  93- 143- con't regimen  1/18- BG was 62 before lunch yesterday-  con't daily BGs- will make sure has hypoglycemia protocol 11.  Hyperlipidemia.  Pravachol 12.  GERD.  Protonix 13.  Constipation.  Colace 100 mg twice daily, MiraLAX as needed- also senokot 1 tab BID- if doesn't work, will do sorbitol tomorrow  1/5- will give sorbitol this evening  1/6- had multiple BMs last night- con't regimen  1/12- says LBM 2 days ago- but isn't eating-  1/13- now says feels constipated- says it's now 4 days since LBM - likely 3 days due to calculations yesterday- will give sorbitol today after therapy.  14. Urinary frequency- will give purewick at night.  1/5- working well- con't purewick, but only at night.   1/6- didn't use purewick last night because frequent stools. But will nightly  1/17- asked to go to bathroom q20 minutes per staff;   1/18- LBM yesterday right after midnight- but pt thinks it's been 3-4 days- was large/documented 15. Chest pain  1/7- gave NTG x2- asked nursing to give 1 more if possible- EKG is OK upon my reading of it- just shows Afib- Troponin done- had been ~4 a few days ago- is now 6- feeling much better with NTG- will monitor closely- if gets worse, will call Cards again.      1/9 having intermittent cp relieved by  nitro.    -increase metoprolol as above   -continue prn sl nitro   -will check another EKG    -question anxiety component also    -will add small dose prn xanax  1/10- chest pain/tightness 7/10 per pt- also SOB (doesn't look SOB), and fatigued- will order another troponins and EKG and called Cards to see her, since recurrent.   1/11- Cards has ordered stress testing for this AM- is NPO for this- await results/cards input.  1/12- will optimize medical mgmt- Cards thinking about Left heart cath??  1/17- Cards appeared to have signed off- no more Chest pain- does c/o mild SOB, but appears comfortable and O2 sats look good- con't regimen  1/18- pt admits only has CP or SOB "when worries too much".  16. Poor appetite-   1/11- will check if due to constipation- check KUB; if (-), might need to start something for appetite- like Remeron?  1/12- will start Remeron 15 mg QHS for sleep/appetite- pt scared will get "heavy"- explained she's not eating a lot- needs to eat a little more.   1/14: continue remeron  1/18- not sure helping appetite, but sleeping well    LOS: 14 days A FACE TO FACE EVALUATION WAS PERFORMED  [name] 11/03/2020, 8:47 AM

## 2020-11-03 NOTE — Progress Notes (Signed)
Occupational Therapy Session Note  Patient Details  Name: [name] MRN: [id number] Date of Birth: 03/13/1941  Today's Date: 11/03/2020 OT Individual Time: 1300-1415 OT Individual Time Calculation (min): 75 min    Short Term Goals: Week 2:  OT Short Term Goal 1 (Week 2): Pt will complete sit<stand with CGA while engaged in LB dressing OT Short Term Goal 2 (Week 2): Pt will don overhead shirt with supervision assist OT Short Term Goal 3 (Week 2): Pt will participate in 1 grooming task while standing at the sink, without use of Stedy, to increase standing tolerance   Skilled Therapeutic Interventions/Progress Updates:    Pt greeted at time of session sitting up in recliner agreeable to OT session, daughter present and provided interpretation services. Pt needing to use the bathroom, walked to/from recliner <> bathroom and toilet transfer Min A overall d/t initial stiffness and main in R hip/pelvis but able to ambulate that 10-15 feet to/from bathroom with RW. Toilet transfer Min/CGA, clothing management in same manner and supervision for hygiene in standing. Wheelchair transport room > tub room for time, ambulated approx 5 feet to TTB and performed transfer after demonstration with Min A to manage BLEs over tub wall. Note TTB switched to face R direction as it will be at her home. Ambulated short distance to w/c Min/CGA and transported back to room for time. Wanting to change clothes, UB dress Min A to pull down in back and LB dress with brief and pants with Mod almost Min A with assist to thread and pt able to stand and pull over hips. Needing to use bathroom again, stand pivot w/c > toilet Min A d/t fatigue and Min overall for toileting, Stedy transfer back to bed for time and fatigue. Sit to supine Min/Mod. Alarm on call bell inr each.     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

## 2020-11-04 ENCOUNTER — Inpatient Hospital Stay (HOSPITAL_COMMUNITY): Payer: Medicare Other

## 2020-11-04 ENCOUNTER — Inpatient Hospital Stay (HOSPITAL_COMMUNITY): Payer: Medicare Other | Admitting: Occupational Therapy

## 2020-11-04 LAB — GLUCOSE, CAPILLARY: Glucose-Capillary: 70 mg/dL (ref 70–99)

## 2020-11-04 NOTE — Patient Care Conference (Signed)
Inpatient RehabilitationTeam Conference and Plan of Care Update Date: 11/03/2020   Time: 11:30 AM    Patient Name: [name]      Medical Record Number: [id number]  Date of Birth: 07-26-1941 Sex: Female         Room/Bed: 4M02C/4M02C-01 Payor Info: Payor: MEDICARE / Plan: MEDICARE PART A AND B / Product Type: *No Product type* /    Admit Date/Time:  10/20/2020 12:46 PM  Primary Diagnosis:  Pelvic fracture [facility])  Hospital Problems: Principal Problem:   Pelvic fracture [facility]) Active Problems:   Precordial pain    Expected Discharge Date: Expected Discharge Date: 11/09/20  Team Members Present: Physician leading conference: Dr. Genice Rouge Care Coodinator Present: Kennyth Arnold, RN, BSN, CRRN;Becky Dupree, LCSW Nurse Present: Otilio Carpen, RN PT Present: Otelia Sergeant, PT OT Present: Earleen Newport, OT PPS Coordinator present : Edson Snowball, Park Breed, SLP     Current Status/Progress Goal Weekly Team Focus  Bowel/Bladder   continent of B/B. last BM 1/15  Remain continent  assess Q shift and PRN   Swallow/Nutrition/ Hydration             ADL's   CGA/Min toilet transfers, Min clothing management, requires extended time all tasks, UB/LB bathing shower level Min A with AE, LB dress Min/Mod for threading and able to stand to pull over hips, pain limiting but improved  Min for LB bathe/dress, Supervision transfers  sit to stands, standing tolerance, ADL transfers, TTB transfer, ADL retraining, family ed   Mobility   min A gait ~30' then rest breaks needed and transfers with rolling walker limited by fatigue and pain. mod A car transfer, min A- mod A steps 2 rails  supervision overall min A steps and car transfer  gait, balance, LE strengthening, transfers   Communication             Safety/Cognition/ Behavioral Observations            Pain   pain 6 of 10  Pain <3  assess pain Q shift and PRN   Skin   skin intact  Maintain skin integrity  assess skin Q shift and PRN      Discharge Planning:  Daughter taking FMLA to provide 24/7 to pt at discharge. Usually daughter is here when not working, being educated when here.   Team Discussion: Going to continue Tramadol, increased Metoprolol, and she is on Metformin. She has urinary frequency, but bladder medications can cause cognition problems. Continent B/B, pain medications can be given every 8 hrs and were given this morning. CBG's are scheduled for every morning per nursing request. Daughter plans to take FMLA. Patient on target to meet rehab goals: Patient is able to use a long handle sponge to assist with bathing, needs help with pants. Needs assist with toilet transfers. She has min assist goals. She is contact guard to min assist for transfers and gait. She can ambulate 38' then rest is needed. She is now working on stairs.   *See Care Plan and progress notes for long and short-term goals.   Revisions to Treatment Plan:  MD is monitoring blood sugars, pain, and urinary frequency.  Teaching Needs: Family education, gait training, stair training, medication management, diabetes management  Current Barriers to Discharge: Inaccessible home environment, Decreased caregiver support, Home enviroment access/layout, Lack of/limited family support, Weight bearing restrictions, Medication compliance and Behavior  Possible Resolutions to Barriers: Continue current medications, weight bearing precautions education, provide emotional support to patient and family.  Medical Summary Current Status: pt has urinary frequency- q20-60 minutes; LBM yesterday- continent usually; BGs daily- 1 episode of 62- low oral meds- pain biggest limiting factor  Barriers to Discharge: Decreased family/caregiver support;Medical stability;Home enviroment access/layout;Medication compliance;Weight bearing restrictions  Barriers to Discharge Comments: daughter taking FMLA as of 1/24- d/c with daughter; biggest limiters- pain in  legs/hips/urinary frequency- d/c 1/24 per daughter request Possible Resolutions to [employer] Focus: min A LB ADLs; pain biggest limiting factor; transfers take a long time- as good as CGA- usually min A- 30 ft RW max distance then rests;   Continued Need for Acute Rehabilitation Level of Care: The patient requires daily medical management by a physician with specialized training in physical medicine and rehabilitation for the following reasons: Direction of a multidisciplinary physical rehabilitation program to maximize functional independence : Yes Medical management of patient stability for increased activity during participation in an intensive rehabilitation regime.: Yes Analysis of laboratory values and/or radiology reports with any subsequent need for medication adjustment and/or medical intervention. : Yes   I attest that I was present, lead the team conference, and concur with the assessment and plan of the team.   Tennis Must 11/04/2020, 11:27 AM

## 2020-11-04 NOTE — Progress Notes (Signed)
Physical Therapy Weekly Progress Note  Patient Details  Name: [name] MRN: [id number] Date of Birth: [birth year]  Beginning of progress report period: October 28, 2020 End of progress report period: November 04, 2020  Today's Date: 11/04/2020 PT Individual Time:Session1: 4098-1191; Arthor Captain: [id number] PT Individual Time Calculation (min): 60 min & 75 min  Patient has met 3 of 3 short term goals.  Patient making good progress and able to achieve STG's this week.  Still has pain at times limiting distance with ambulation and needing increased time for transfers and gait (noted TUG performed today in 126 sec.)  Patient also with L LE weakness from prior CVA.    Patient continues to demonstrate the following deficits muscle weakness, muscle joint tightness and pain and decreased coordination and therefore will continue to benefit from skilled PT intervention to increase functional independence with mobility.  Patient progressing toward long term goals..  Continue plan of care.  PT Short Term Goals Week 2:  PT Short Term Goal 1 (Week 2): Patient will ambulate 50' with CGA with RW. PT Short Term Goal 1 - Progress (Week 2): Met PT Short Term Goal 2 (Week 2): Patient will negotiate 2 steps with min to mod A with rail PT Short Term Goal 2 - Progress (Week 2): Met PT Short Term Goal 3 (Week 2): Patient will perform car transfer with mod A. PT Short Term Goal 3 - Progress (Week 2): Met Week 3:  PT Short Term Goal 1 (Week 3): STG=LTG due to ELOS  Skilled Therapeutic Interventions/Progress Updates:  Ambulation/gait training;DME/adaptive equipment instruction;Functional mobility training;Balance/vestibular training;Patient/family education;Therapeutic Activities;UE/LE Strength taining/ROM;Wheelchair propulsion/positioning;UE/LE Coordination activities;Therapeutic Exercise;Stair training  Session1: Patient in bathroom in [place].  Performed hygiene seated and pt standing up in [place] prior to PT at side to  assist and pt pulled up her pants.  Assisted to w/c total A in [place].  Pushed pt in w/c to therapy gym.  Patient performed car transfer with min A using step stool and cues for technique.  Daughter able to visualize.  Ambulated from car to doorway of ortho gym ~50' with RW and CGA to min A at times to increase speed helping push RW.  Pushed in w/c to stairs.  Patient negotiated 4 steps with bilateral rails and min to mod A cues for technique.  Daughter reports only L railing on entry steps at home.  Attempted with cane and rail for forward technique using L first up and R first down, but pt difficulty and preferred familiar technique with two hands to rail sideways despite pain in R leg lowering off step.  Performed TUG with RW and min A in 126 sec.  Patient assisted in w/c to room.  Stand step to recliner with CGA with RW.  Left with call bell in reach and daughter in the room.   Session2: Patient in bathroom in [place].  Daughter in the room and reports she has to go every thirty minutes so sometimes quicker to take in [place].  Patient agreeable to attempt walking out of bathroom so used RW and CGA for sit to stand.  Donned pants after performing hygiene while seated with min A.  Patient ambulated in the room to sink with CGA and increased time cues for positioning at sink.  PAtient performed standing tasks with 1 UE support to no UE support to wash face, brush teeth and don mask.  Patient walked to w/c with CGA with RW.  Pushed in w/c to therapy gym.  Performed  standing balance task at BITS to touch letters A-Z with cues and 1 UE support.  Then tapping targets randomly appearing over screen.  Patient in parallel bars twisting to retrieve then give weighted ball cues for visual contact with PT behind her for trunk rotation, balance and habituation.  Ambulated around 2 cones with RW figure of 8 for working on turning with cues and CGA to close S.  Patient transported to ortho gym and stand step to Nu Step with CGA  with RW.  6 minutes UE/LE level 2 with rest after 3 minutes for endurance, strength.  Assisted in w/c to room and pt requesting to toilet.  Noted OT checked daughter off to assist to bathroom so pt sit to stand from w/c at doorway of bathroom and ambulating with RW into bathroom, handoff to daughter to complete toileting.    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

## 2020-11-04 NOTE — Progress Notes (Signed)
Patient ID: [name], female   DOB: [birth year], 80 y.o.   MRN: [id number] Saw daughter when here to discuss team conference she is agreeable to the plan and prefers [facility], since has used them before when Mom was here in 2016. Will make referral and see if can staff it.

## 2020-11-04 NOTE — Progress Notes (Signed)
[place] PHYSICAL MEDICINE & REHABILITATION PROGRESS NOTE   Subjective/Complaints:  Talked to pt via daughter-  She feels good- not sleepy today like was last few days.  LBM 1/17- very early AM- ~ 1am.      ROS:  Limited due to language/cognition  Objective:   No results found. No results for input(s): WBC, HGB, HCT, PLT in the last 72 hours. No results for input(s): NA, K, CL, CO2, GLUCOSE, BUN, CREATININE, CALCIUM in the last 72 hours.  Intake/Output Summary (Last 24 hours) at 11/04/2020 0857 Last data filed at 11/04/2020 0834 Gross per 24 hour  Intake 916 ml  Output -  Net 916 ml     Physical Exam: Vital Signs Blood pressure 106/64, pulse 71, temperature (!) 97.4 F (36.3 C), temperature source Oral, resp. rate 20, height 4\' 10"  (1.473 m), weight 64.6 kg, SpO2 98 %. Gen: pt in our car; daughter and PT at side, NAD HEENT: oral mucosa pink and moist, NCAT Cardio: irregular rhythm; somewhat tachycardic right now Chest: CTA B/L- no W/R/R- good air movement Abd: Soft, NT, ND, (+)BS   Ext: no edema Psych: brighter affect this AM Musculoskeletal: TTP over R groin/R hip- no change- also TTP over inner thighs- stable, but CP TTP is resolved    Comments: UEs 5-/5 in RUE-  but appears 5-/5 LUE- 4/5 in LUE in same muscles tested RLE- HF 1/5? Pain; KE 2-/5, pain?, DF and PF at least 4/5, but limited by pain as well LLE- HF 2/5? Pain and weak; KE 3-/5, DF and PF 3-/5  Skin: bruising- purple/yellow on R>L inner thighs- no hematoma identified- mild swelling associated--stable to improved Neurological: Ox2-3  Assessment/Plan: 1. Functional deficits which require 3+ hours per day of interdisciplinary therapy in a comprehensive inpatient rehab setting.  Physiatrist is providing close team supervision and 24 hour management of active medical problems listed below.  Physiatrist and rehab team continue to assess barriers to discharge/monitor patient progress toward functional and  medical goals  Care Tool:  Bathing    Body parts bathed by patient: Right arm,Left arm,Chest,Abdomen,Front perineal area,Right upper leg,Left upper leg,Face,Right lower leg,Left lower leg   Body parts bathed by helper: Buttocks     Bathing assist Assist Level: Minimal Assistance - Patient > 75%     Upper Body Dressing/Undressing Upper body dressing   What is the patient wearing?: Pull over shirt    Upper body assist Assist Level: Minimal Assistance - Patient > 75%    Lower Body Dressing/Undressing Lower body dressing      What is the patient wearing?: Pants,Incontinence brief     Lower body assist Assist for lower body dressing: Moderate Assistance - Patient 50 - 74%     Toileting Toileting    Toileting assist Assist for toileting: Minimal Assistance - Patient > 75%     Transfers Chair/bed transfer  Transfers assist     Chair/bed transfer assist level: Minimal Assistance - Patient > 75%     Locomotion Ambulation   Ambulation assist      Assist level: Minimal Assistance - Patient > 75% Assistive device: Walker-rolling Max distance: 20'   Walk 10 feet activity   Assist  Walk 10 feet activity did not occur: Safety/medical concerns  Assist level: Minimal Assistance - Patient > 75% Assistive device: Walker-rolling   Walk 50 feet activity   Assist Walk 50 feet with 2 turns activity did not occur: Safety/medical concerns         Walk 150  feet activity   Assist Walk 150 feet activity did not occur: Safety/medical concerns         Walk 10 feet on uneven surface  activity   Assist Walk 10 feet on uneven surfaces activity did not occur: Safety/medical concerns         Wheelchair     Assist Will patient use wheelchair at discharge?: Yes Type of Wheelchair: Manual    Wheelchair assist level: Supervision/Verbal cueing Max wheelchair distance: 60    Wheelchair 50 feet with 2 turns activity    Assist    Wheelchair 50  feet with 2 turns activity did not occur: Safety/medical concerns   Assist Level: Supervision/Verbal cueing   Wheelchair 150 feet activity     Assist  Wheelchair 150 feet activity did not occur: Safety/medical concerns       Blood pressure 106/64, pulse 71, temperature (!) 97.4 F (36.3 C), temperature source Oral, resp. rate 20, height 4\' 10"  (1.473 m), weight 64.6 kg, SpO2 98 %.  Medical Problem List and Plan: 1.  Decreased functional mobility secondary to right superior and inferior pubic rami fracture nondisplaced after a fall.  Weightbearing as tolerated.  Follow-up Dr.             -patient may  shower  1/10- explained sore legs and stiffness is normal- take prn pain meds             -ELOS/Goals: 10-14 days- supervision to [facility]  1/13- D/c date moved to 1/24 due to daughter's schedule.   Continue CIR 2.  Antithrombotics: -DVT/anticoagulation: Continue chronic Eliquis             -antiplatelet therapy: N/A 3. Pain Management: Neurontin 100 mg nightly as needed, Lidoderm patch as directed oxycodone and Zanaflex as needed- will add tramadol 50 mg q6 hours prn since pt says Percocet wears off too fast.   1/5- will add lidoderm patches 2 patches 12 hrs on;12 hrs off- 8am to 8pm  1/13-14:- pain controlled-  continue regimen. Wean Percocet to [facility] PRN  1/15: pain is well controlled: wean Percocet to q8H prn  1/16: Has not required percocet in last 24 hours, wean to q12H prn  1/18- c/o aching/painful/tired legs- con't tramadol prn 4. Mood: Provide emotional support             -antipsychotic agents: N/A 5. Neuropsych: This patient is? capable of making decisions on her own behalf. 6. Skin/Wound Care: Routine skin checks 7. Fluids/Electrolytes/Nutrition: Routine in and outs with follow-up chemistries 8.  Chronic atrial fibrillation.  Continue Eliquis.  Cardiac rate controlled.  Lopressor 12.5 mg twice daily  1/9 rate up a little this morning, has frequently been close to  100   -will increase lopressor to 25mg  bid which should also help with CP  1/18- HR 80s- con't regimen  1/19- HR 100s this AM, but was doing car transfers- con't regimen 9.  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  Received CIR 10/19/2016 to 11/01/2016.  Patient used a rolling walker prior to admission. 10.  Diabetes mellitus.  Hemoglobin A1c 6.4.  Metformin resumed 500 mg twice daily  1/5- restarted metformin and stopped insulin this AM- didn't give it-   1/17- BGs-  93- 143- con't regimen  1/18- BG was 62 before lunch yesterday- con't daily BGs- will make sure has hypoglycemia protocol  1/19- BGs 82 yesterday- con't  regimen 11.  Hyperlipidemia.  Pravachol 12.  GERD.  Protonix 13.  Constipation.  Colace 100 mg twice daily, MiraLAX as needed- also senokot 1 tab BID- if doesn't work, will do sorbitol tomorrow  1/5- will give sorbitol this evening  1/6- had multiple BMs last night- con't regimen  1/12- says LBM 2 days ago- but isn't eating-  1/13- now says feels constipated- says it's now 4 days since LBM - likely 3 days due to calculations yesterday- will give sorbitol today after therapy.  14. Urinary frequency- will give purewick at night.  1/5- working well- con't purewick, but only at night.   1/6- didn't use purewick last night because frequent stools. But will nightly  1/17- asked to go to bathroom q20 minutes per staff;   1/18- LBM yesterday right after midnight- but pt thinks it's been 3-4 days- was large/documented  1/19- LBM 2 days ago 15. Chest pain  1/7- gave NTG x2- asked nursing to give 1 more if possible- EKG is OK upon my reading of it- just shows Afib- Troponin done- had been ~4 a few days ago- is now 6- feeling much better with NTG- will monitor closely- if gets worse, will call Cards again.      1/9 having intermittent cp relieved by nitro.    -increase metoprolol as  above   -continue prn sl nitro   -will check another EKG    -question anxiety component also    -will add small dose prn xanax  1/10- chest pain/tightness 7/10 per pt- also SOB (doesn't look SOB), and fatigued- will order another troponins and EKG and called Cards to see her, since recurrent.   1/11- Cards has ordered stress testing for this AM- is NPO for this- await results/cards input.  1/12- will optimize medical mgmt- Cards thinking about Left heart cath??  1/17- Cards appeared to have signed off- no more Chest pain- does c/o mild SOB, but appears comfortable and O2 sats look good- con't regimen  1/19- pt looking great- went over stress test with daughter- the results and that doesn't appear to need L heart cath.  16. Poor appetite-   1/11- will check if due to constipation- check KUB; if (-), might need to start something for appetite- like Remeron?  1/12- will start Remeron 15 mg QHS for sleep/appetite- pt scared will get "heavy"- explained she's not eating a lot- needs to eat a little more.   1/14: continue remeron  1/18- not sure helping appetite, but sleeping well  1/19- has been sleepy a lot- but not today- might need to stop Remeron?    LOS: 15 days A FACE TO FACE EVALUATION WAS PERFORMED  Knox Cervi 11/04/2020, 8:57 AM

## 2020-11-04 NOTE — Progress Notes (Signed)
Occupational Therapy Session Note  Patient Details  Name: [name] MRN: [id number] Date of Birth: Jul 21, 1941  Today's Date: 11/04/2020 OT Individual Time: 6237-6283 OT Individual Time Calculation (min): 56 min    Short Term Goals: Week 2:  OT Short Term Goal 1 (Week 2): Pt will complete sit<stand with CGA while engaged in LB dressing OT Short Term Goal 2 (Week 2): Pt will don overhead shirt with supervision assist OT Short Term Goal 3 (Week 2): Pt will participate in 1 grooming task while standing at the sink, without use of Stedy, to increase standing tolerance   Skilled Therapeutic Interventions/Progress Updates:    Pt greeted at time of session on commode with daughter present who provided interpretation services today. Initially Stedy had been used to get to toilet, but pt wanting to shower and walked short distance to shower bench with CGA/Min with RW. Once on TTB, doffed clothing with Min A for time, performed UB/LB bathing with CGA today which is a first, pt using LHS for washing back and feet and able to static standing holding on to grab bar for washing buttocks with only CGA. Dried off in same manner except help to dry off feet. Daughter present throughout for training, stating she feels comfortable with showering pt at home. Also discussed tub set up for grab bar placement and also lateral leans if needed for buttocks washing if standing is not safe at home. Donned shirt Min A to get over back, donned brief seated with trialing reacher for underwear and pants, difficulty sequencing new AE and in the end needed assist to thread. Min/Mod for LB dress and walked short distance to w/c in room, set up at sink for hair brushing and drying hair. Stand pivot w/c > recliner Min/CGA, alarm on call bell in reach.   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

## 2020-11-05 ENCOUNTER — Inpatient Hospital Stay (HOSPITAL_COMMUNITY): Payer: Medicare Other

## 2020-11-05 ENCOUNTER — Inpatient Hospital Stay (HOSPITAL_COMMUNITY): Payer: Medicare Other | Admitting: Occupational Therapy

## 2020-11-05 LAB — GLUCOSE, CAPILLARY: Glucose-Capillary: 80 mg/dL (ref 70–99)

## 2020-11-05 MED ORDER — METFORMIN HCL 500 MG PO TABS
250.0000 mg | ORAL_TABLET | Freq: Two times a day (BID) | ORAL | Status: DC
  Administered 2020-11-05 – 2020-11-09 (×8): 250 mg via ORAL
  Filled 2020-11-05 (×9): qty 1

## 2020-11-05 MED ORDER — OXYCODONE-ACETAMINOPHEN 5-325 MG PO TABS
1.0000 | ORAL_TABLET | Freq: Three times a day (TID) | ORAL | Status: DC | PRN
  Administered 2020-11-05 – 2020-11-08 (×6): 1 via ORAL
  Filled 2020-11-05 (×6): qty 1

## 2020-11-05 MED ORDER — SENNOSIDES-DOCUSATE SODIUM 8.6-50 MG PO TABS
1.0000 | ORAL_TABLET | Freq: Three times a day (TID) | ORAL | Status: DC
  Administered 2020-11-05 – 2020-11-09 (×12): 1 via ORAL
  Filled 2020-11-05 (×12): qty 1

## 2020-11-05 MED ORDER — SORBITOL 70 % SOLN
30.0000 mL | Freq: Once | Status: AC
  Administered 2020-11-05: 30 mL via ORAL
  Filled 2020-11-05: qty 30

## 2020-11-05 NOTE — Progress Notes (Signed)
[place] PHYSICAL MEDICINE & REHABILITATION PROGRESS NOTE   Subjective/Complaints:  Talked to pt via interpretor online.  Pt reports legs in pain- hasn't taken pain meds since yesterday. Asking for meds  Needs to void, but per NT, just voided 5 minutes ago.   Will ask nurse to get her pain meds    ROS:  Limited due to cognition/language  Objective:   No results found. No results for input(s): WBC, HGB, HCT, PLT in the last 72 hours. No results for input(s): NA, K, CL, CO2, GLUCOSE, BUN, CREATININE, CALCIUM in the last 72 hours.  Intake/Output Summary (Last 24 hours) at 11/05/2020 0927 Last data filed at 11/05/2020 0837 Gross per 24 hour  Intake 767 ml  Output -  Net 767 ml     Physical Exam: Vital Signs Blood pressure 112/66, pulse 73, temperature 98 F (36.7 C), resp. rate 20, height 4\' 10"  (1.473 m), weight 64.6 kg, SpO2 97 %. Gen: pt sitting up in bedside chair, appropriate, NAD HEENT: oral mucosa pink and moist, NCAT Cardio: irregular rhythm; rate controlled Chest: CTA B/L- no W/R/R- good air movement Abd: Soft, NT, ND, (+)BS    Ext: no edema Psych: brighter affect this AM- c/o pain- didn't understand need that had to ask for pain meds Musculoskeletal: TTP over R groin/R hip- no change- also TTP over inner thighs- stable, but CP TTP is resolved    Comments: UEs 5-/5 in RUE-  but appears 5-/5 LUE- 4/5 in LUE in same muscles tested RLE- HF 1/5? Pain; KE 2-/5, pain?, DF and PF at least 4/5, but limited by pain as well LLE- HF 2/5? Pain and weak; KE 3-/5, DF and PF 3-/5  Skin: bruising- purple/yellow on R>L inner thighs- no hematoma identified- mild swelling associated--stable to improved Neurological: Ox2-3  Assessment/Plan: 1. Functional deficits which require 3+ hours per day of interdisciplinary therapy in a comprehensive inpatient rehab setting.  Physiatrist is providing close team supervision and 24 hour management of active medical problems listed  below.  Physiatrist and rehab team continue to assess barriers to discharge/monitor patient progress toward functional and medical goals  Care Tool:  Bathing    Body parts bathed by patient: Right arm,Left arm,Chest,Abdomen,Front perineal area,Right upper leg,Left upper leg,Face,Right lower leg,Left lower leg,Buttocks   Body parts bathed by helper: Buttocks     Bathing assist Assist Level: Contact Guard/Touching assist     Upper Body Dressing/Undressing Upper body dressing   What is the patient wearing?: Pull over shirt    Upper body assist Assist Level: Minimal Assistance - Patient > 75%    Lower Body Dressing/Undressing Lower body dressing      What is the patient wearing?: Pants,Incontinence brief     Lower body assist Assist for lower body dressing: Moderate Assistance - Patient 50 - 74%     Toileting Toileting    Toileting assist Assist for toileting: Minimal Assistance - Patient > 75%     Transfers Chair/bed transfer  Transfers assist     Chair/bed transfer assist level: Minimal Assistance - Patient > 75%     Locomotion Ambulation   Ambulation assist      Assist level: Minimal Assistance - Patient > 75% Assistive device: Walker-rolling Max distance: 50'   Walk 10 feet activity   Assist  Walk 10 feet activity did not occur: Safety/medical concerns  Assist level: Minimal Assistance - Patient > 75% Assistive device: Walker-rolling   Walk 50 feet activity   Assist Walk 50 feet with  2 turns activity did not occur: Safety/medical concerns  Assist level: Minimal Assistance - Patient > 75%      Walk 150 feet activity   Assist Walk 150 feet activity did not occur: Safety/medical concerns         Walk 10 feet on uneven surface  activity   Assist Walk 10 feet on uneven surfaces activity did not occur: Safety/medical concerns         Wheelchair     Assist Will patient use wheelchair at discharge?: Yes Type of Wheelchair:  Manual    Wheelchair assist level: Supervision/Verbal cueing Max wheelchair distance: 60    Wheelchair 50 feet with 2 turns activity    Assist    Wheelchair 50 feet with 2 turns activity did not occur: Safety/medical concerns   Assist Level: Supervision/Verbal cueing   Wheelchair 150 feet activity     Assist  Wheelchair 150 feet activity did not occur: Safety/medical concerns       Blood pressure 112/66, pulse 73, temperature 98 F (36.7 C), resp. rate 20, height 4\' 10"  (1.473 m), weight 64.6 kg, SpO2 97 %.  Medical Problem List and Plan: 1.  Decreased functional mobility secondary to right superior and inferior pubic rami fracture nondisplaced after a fall.  Weightbearing as tolerated.  Follow-up Dr.             -patient may  shower  1/10- explained sore legs and stiffness is normal- take prn pain meds             -ELOS/Goals: 10-14 days- supervision to [facility]  1/13- D/c date moved to 1/24 due to daughter's schedule.   Continue CIR 2.  Antithrombotics: -DVT/anticoagulation: Continue chronic Eliquis             -antiplatelet therapy: N/A 3. Pain Management: Neurontin 100 mg nightly as needed, Lidoderm patch as directed oxycodone and Zanaflex as needed- will add tramadol 50 mg q6 hours prn since pt says Percocet wears off too fast.   1/5- will add lidoderm patches 2 patches 12 hrs on;12 hrs off- 8am to 8pm  1/13-14:- pain controlled-  continue regimen. Wean Percocet to [facility] PRN  1/15: pain is well controlled: wean Percocet to q8H prn  1/16: Has not required percocet in last 24 hours, wean to q12H prn  1/18- c/o aching/painful/tired legs- con't tramadol prn  1/20- will change percocet to q8 hours prn per pt request 4. Mood: Provide emotional support             -antipsychotic agents: N/A 5. Neuropsych: This patient is? capable of making decisions on her own behalf. 6. Skin/Wound Care: Routine skin checks 7. Fluids/Electrolytes/Nutrition: Routine in and outs with  follow-up chemistries 8.  Chronic atrial fibrillation.  Continue Eliquis.  Cardiac rate controlled.  Lopressor 12.5 mg twice daily  1/9 rate up a little this morning, has frequently been close to 100   -will increase lopressor to 25mg  bid which should also help with CP  1/18- HR 80s- con't regimen  1/19- HR 100s this AM, but was doing car transfers- con't regimen  1/20- HR 70s- con't regimen 9.  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  Received CIR 10/19/2016 to 11/01/2016.  Patient used a rolling walker prior to admission. 10.  Diabetes mellitus.  Hemoglobin A1c 6.4.  Metformin resumed 500 mg twice daily  1/5- restarted metformin and stopped  insulin this AM- didn't give it-   1/17- BGs-  93- 143- con't regimen  1/18- BG was 62 before lunch yesterday- con't daily BGs- will make sure has hypoglycemia protocol  1/19- BGs 82 yesterday- con't regimen  1/20- BG 70 yesterday late morning- decrease metformin to 250 mg BID with meals 11.  Hyperlipidemia.  Pravachol 12.  GERD.  Protonix 13.  Constipation.  Colace 100 mg twice daily, MiraLAX as needed- also senokot 1 tab BID- if doesn't work, will do sorbitol tomorrow  1/5- will give sorbitol this evening  1/6- had multiple BMs last night- con't regimen  1/12- says LBM 2 days ago- but isn't eating-  1/13- now says feels constipated- says it's now 4 days since LBM - likely 3 days due to calculations yesterday- will give sorbitol today after therapy.  14. Urinary frequency- will give purewick at night.  1/5- working well- con't purewick, but only at night.   1/6- didn't use purewick last night because frequent stools. But will nightly  1/17- asked to go to bathroom q20 minutes per staff;   1/18- LBM yesterday right after midnight- but pt thinks it's been 3-4 days- was large/documented  1/19- LBM 2 days ago  1/20- LBM 3 days ago- will increase bowel  meds and give sorbitol again. After therapy- increased senokot S to TID 15. Chest pain  1/7- gave NTG x2- asked nursing to give 1 more if possible- EKG is OK upon my reading of it- just shows Afib- Troponin done- had been ~4 a few days ago- is now 6- feeling much better with NTG- will monitor closely- if gets worse, will call Cards again.      1/9 having intermittent cp relieved by nitro.    -increase metoprolol as above   -continue prn sl nitro   -will check another EKG    -question anxiety component also    -will add small dose prn xanax  1/10- chest pain/tightness 7/10 per pt- also SOB (doesn't look SOB), and fatigued- will order another troponins and EKG and called Cards to see her, since recurrent.   1/11- Cards has ordered stress testing for this AM- is NPO for this- await results/cards input.  1/12- will optimize medical mgmt- Cards thinking about Left heart cath??  1/17- Cards appeared to have signed off- no more Chest pain- does c/o mild SOB, but appears comfortable and O2 sats look good- con't regimen  1/19- pt looking great- went over stress test with daughter- the results and that doesn't appear to need L heart cath.  16. Poor appetite-   1/11- will check if due to constipation- check KUB; if (-), might need to start something for appetite- like Remeron?  1/12- will start Remeron 15 mg QHS for sleep/appetite- pt scared will get "heavy"- explained she's not eating a lot- needs to eat a little more.   1/14: continue remeron  1/18- not sure helping appetite, but sleeping well  1/19- has been sleepy a lot- but not today- might need to stop Remeron?  1/20- stop Remeron- has been too sleepy with it.    LOS: 16 days A FACE TO FACE EVALUATION WAS PERFORMED  Sharyah Bostwick 11/05/2020, 9:27 AM

## 2020-11-05 NOTE — Plan of Care (Signed)
  Problem: RH Wheelchair Mobility Goal: LTG Patient will propel w/c in controlled environment (PT) Description: LTG: Patient will propel wheelchair in controlled environment, # of feet with assist (PT) Flowsheets (Taken 11/05/2020 1456) LTG: Pt will propel w/c in controlled environ  assist needed:: Supervision/Verbal cueing LTG: Propel w/c distance in controlled environment: 50'   Problem: RH Stairs Goal: LTG Patient will ambulate up and down stairs w/assist (PT) Description: LTG: Patient will ambulate up and down # of stairs with assistance (PT) Flowsheets (Taken 11/05/2020 1456) LTG: Pt will ambulate up/down stairs assist needed:: Minimal Assistance - Patient > 75% LTG: Pt will  ambulate up and down number of stairs: 4  Downgraded goal for w/c mobility distance due to L UE weakness and upgraded goal for number of steps due to more than 2 to enter her home.  Sheran Lawless, PT

## 2020-11-05 NOTE — Discharge Instructions (Signed)
Inpatient Rehab Discharge Instructions  [facility] Discharge date and time: No discharge date for patient encounter.   Activities/Precautions/ Functional Status: Activity: activity as tolerated Diet: diabetic diet Wound Care: Routine skin checks Functional status:  ___ No restrictions     ___ Walk up steps independently ___ 24/7 supervision/assistance   ___ Walk up steps with assistance ___ Intermittent supervision/assistance  ___ Bathe/dress independently ___ Walk with walker     _x__ Bathe/dress with assistance ___ Walk Independently    ___ Shower independently ___ Walk with assistance    ___ Shower with assistance ___ No alcohol     ___ Return to work/school ________  Special Instructions: No driving smoking or alcohol    COMMUNITY REFERRALS UPON DISCHARGE:    Home Health:   PT, OT, RN ,AIDE                Agency:[facility] [phone]    Medical Equipment/Items Ordered:WHEELCHAIR                                                         [facility]-[id number]                                                     My questions have been answered and I understand these instructions. I will adhere to these goals and the provided educational materials after my discharge from the hospital.  Patient/Caregiver Signature _______________________________ Date __________  Clinician Signature _______________________________________ Date __________  Please bring this form and your medication list with you to all your follow-up doctor's appointments.

## 2020-11-05 NOTE — Progress Notes (Signed)
Occupational Therapy Session Note  Patient Details  Name: [name] MRN: [id number] Date of Birth: 1940/12/20  Today's Date: 11/05/2020 OT Individual Time: 1300-1355 OT Individual Time Calculation (min): 55 min    Short Term Goals: Week 2:  OT Short Term Goal 1 (Week 2): Pt will complete sit<stand with CGA while engaged in LB dressing OT Short Term Goal 2 (Week 2): Pt will don overhead shirt with supervision assist OT Short Term Goal 3 (Week 2): Pt will participate in 1 grooming task while standing at the sink, without use of Stedy, to increase standing tolerance   Skilled Therapeutic Interventions/Progress Updates:    Pt greeted at time of session sitting up in recliner agreeable to OT session, tele interpreter used throughout. Ambulated chair <> bathroom initially Min A d/t hip pain fading to CGA and transferred to toilet in same manner. Hygiene and clothing management today with CGA/Min. Walked toilet > w/c in room CGA with RW all mobility. Transported to gym dependent for time and performed seated SCIFIT on level 2 for total of 7 minutes switching directions half way through. Performed dynamic standing at [facility] for 1-2 minute intervals, reaching out of base of support and standing with unilateral support for carryover for LB ADLs. Back in room, stand pivot to recliner CGA with RW and se tup with alarm on, call bell in reach. Note that RN made aware of pain, 6/10.   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

## 2020-11-05 NOTE — Progress Notes (Signed)
Occupational Therapy Discharge Summary  Patient Details  Name: [name] MRN: [id number] Date of Birth: Jan 12, 1941   Patient has met 7 of 9 long term goals due to improved activity tolerance, improved balance, postural control, ability to compensate for deficits, improved awareness and improved coordination.  Patient to discharge at [facility] Assist level.  Patient's care partner is independent to provide the necessary physical and cognitive assistance at discharge.  Pt is overall close supervision for ADL transfers to chair, toilet, etc. Occasionally Min A for TTB transfers to manage BLEs over tub wall d/t hip pain, close supervision for toilet tasks for clothing management and hygiene in standing with RW for support, CGA for bathing at shower level with use of LHS for feet and back, Min-Mod A for LB dressing for assist to thread and pt able to don over hips. Pt requires 24/7 supervision d/t cognitive deficits and need for supervision for safety. Daughter has been present for many sessions and has completed family training/education.   Reasons goals not met: Pt did not meet goal for LB dressing as she needs Min to Mod A, and performs TTB transfers with Min A to manage BLEs d/t hip pain.  Recommendation:  Patient will benefit from ongoing skilled OT services in home health setting to continue to advance functional skills in the area of BADL and Reduce care partner burden.  Equipment: No equipment provided Pt has BSC, TTB, and RW.  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, pelvic pain Restrictions Weight Bearing Restrictions: Yes RLE Weight Bearing: Weight bearing as tolerated Pain Pain Assessment Pain Scale: 0-10 Pain Score: 7  Pain Location: Back Pain Orientation: Right Pain Descriptors  / Indicators: Aching Pain Frequency: Constant Pain Onset: With Activity Patients Stated Pain Goal: 4 Pain Intervention(s): Medication (See eMAR) (percocet given) Multiple Pain Sites: No ADL ADL Eating: Set up (per staff) Grooming: Setup Where Assessed-Grooming: Sitting at sink Upper Body Bathing: Supervision/safety Where Assessed-Upper Body Bathing: Shower Lower Body Bathing: Contact guard Where Assessed-Lower Body Bathing: Shower Upper Body Dressing: Minimal assistance Where Assessed-Upper Body Dressing: Wheelchair Lower Body Dressing: Moderate assistance Where Assessed-Lower Body Dressing: Wheelchair Toileting: Contact guard Where Assessed-Toileting: Toilet,Bedside Commode Toilet Transfer: Therapist, music Method: Education officer, community: Minimal Museum/gallery conservator Method: Stand pivot Vision Patient Visual Report: No change from baseline Perception  Perception: Within Functional Limits Praxis Praxis: Impaired Cognition Overall Cognitive Status: History of cognitive impairments - at baseline Arousal/Alertness: Awake/alert Memory: Impaired Awareness: Appears intact Problem Solving: Appears intact Safety/Judgment: Impaired Sensation Sensation Light Touch: Appears Intact Coordination Gross Motor Movements are Fluid and Coordinated: No Fine Motor Movements are Fluid and Coordinated: No Coordination and Movement Description: limited on R by pain but improved from eval, limited on L by weakness from old CVA Motor  Motor Motor: Abnormal postural alignment and control;Motor apraxia Motor - Skilled Clinical Observations: limited at times weight shifting d/t R pain but significantly improved Mobility  Transfers Sit to Stand: Supervision/Verbal cueing Stand to Sit: Supervision/Verbal cueing  Trunk/Postural Assessment  Cervical Assessment Cervical Assessment: Exceptions to [facility] Thoracic  Assessment Thoracic Assessment: Exceptions to [facility] Lumbar Assessment Lumbar Assessment: Exceptions to [facility] Postural Control Postural Control: Within Functional Limits  Balance Balance Balance Assessed: Yes Static Sitting Balance Static Sitting - Balance Support: Feet supported Static Sitting - Level of  Assistance: 6: Modified independent (Device/Increase time) Dynamic Sitting Balance Dynamic Sitting - Balance Support: Feet supported;Left upper extremity supported Dynamic Sitting - Level of Assistance: 5: Stand by assistance Dynamic Sitting - Balance Activities: Lateral lean/weight shifting;Forward lean/weight shifting;Reaching for objects Static Standing Balance Static Standing - Balance Support: During functional activity;Bilateral upper extremity supported Static Standing - Level of Assistance: 5: Stand by assistance Dynamic Standing Balance Dynamic Standing - Balance Support: During functional activity;Left upper extremity supported Dynamic Standing - Level of Assistance: 5: Stand by assistance Dynamic Standing - Balance Activities: Forward lean/weight shifting;Lateral lean/weight shifting;Reaching for objects Dynamic Standing - Comments: during toilet for clothing management, washing buttocks during bathing tasks Extremity/Trunk Assessment RUE Assessment RUE Assessment: Within Functional Limits LUE Assessment LUE Assessment: Exceptions to [facility] General Strength Comments: decreased AROM and strength from previous CVA but able to be functional assist during ADL   Viona Gilmore 11/06/2020, 6:24 PM

## 2020-11-05 NOTE — Progress Notes (Signed)
Physical Therapy Session Note  Patient Details  Name: [name] MRN: [id number] Date of Birth: 11-01-1940  Today's Date: 11/05/2020 PT Individual Time:Session1: 9798-9211; [place]: 9417-4081 PT Individual Time Calculation (min): 57 min & 56 min  Short Term Goals: Week 3:  PT Short Term Goal 1 (Week 3): STG=LTG due to ELOS  Skilled Therapeutic Interventions/Progress Updates:    Session1:  Patient in bathroom with nursing in the room.  Removed Stedy and pt seated for hygiene.  Sit to stand to RW min A.  Patient pulled up pants min A.  Utilized AMN video interpreter Wele throughout session.  Ambulated to sink and performed oral hygiene, washed face standing with intermittent UE support with S to CGA.  Patient ambulated to door with RW and CGA (total 25').  Patient pushed in w/c to therapy gym.  Patient transferred to mat with RW and CGA.  Sit to supine on mat mod A then repositioning shoulders onto wedge and pillow.  Patient performed supine bridging 2 x 10 with cues, SLR with A x 10, hooklying hip abduction with orange t-band 2 x 10, SAQ w/ 4 # 2 x 10 with target for increased knee extension.  Supine to sit with mod A lifting trunk.  Patient seated for UE therex with orange t-band x 2 x 10 rows, horizontal abduction, and diagonal flexion.  Patient sit to stand and ambulated x 50' with CGA and cues for walker proximity, and L step length.  Patient in w/c assisted to room and stand step to recliner with RW and CGA.  Left seated with call bell and needs in reach and belt alarm activated.  Session2:  Patient in recliner requesting to toilet.  Assisted to stand to RW with S, ambulated to bathroom intermittent CGA and cues.  Standing to doff pants and brief with CGA.  Patient ambulated to w/c S with RW.  Assisted in w/c to therapy gym.  Practiced stair negotiation with single L rail as at home with mod cues using AMN video interpreter throughout session.  Patient needing mod A and increased time with pain R LE  using sideways technique with both hands to one rail.  Patient ambulated x 22' with RW S/CGA to mat cues for proximity to walker and safety backing up to sit.  Performed standing balance with bilat UE support alternate taps to 4" step x 5 each leg with increased R LE pain.  Standing twists to give and retrieve weighted ball from PT behind with no UE support cues to visualize PT with each turn x 10 each way.  Patient performed stepping forward and back over hockey stick with RW and cues CGA for safety backing up.  Patient ambulated x 33' with RW and S increased time and cues for safety.  Assisted in w/c to room and stand step to recliner with close S.  LEft with alarm belt on and call bell/needs in reach.  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

## 2020-11-05 NOTE — Progress Notes (Signed)
Inpatient Rehabilitation Care Coordinator Discharge Note  The overall goal for the admission was met for:   Discharge location: Yes-HOME WITH DAUGHTER WHO HAS TAKEN A FMLA AND HER FAMILY  Length of Stay: Yes-20 DAYS  Discharge activity level: Yes-CGA-MIN LEVEL  Home/community participation: Yes  Services provided included: MD, RD, PT, OT, SLP, RN, CM, Pharmacy and SW  Financial Services: Medicare and Medicaid  Choices offered to/list presented to:yes  Follow-up services arranged: Home Health: [place], DME: [facility] and Patient/Family request agency HH: HAD THEM BEFORE 2016, DME: NO PREF  Comments (or additional information):DAUGHTER HAS BEEN IN FOR EDUCATION AND AWARE OF PT'S NEED FOR 24/7 CARE AT DISCHARGE  Patient/Family verbalized understanding of follow-up arrangements: Yes  Individual responsible for coordination of the follow-up plan: Stepanie-LIEW-DAUGHTER [phone]  Confirmed correct DME delivered: Elease Hashimoto 11/05/2020    Zach Tietje, Gardiner Rhyme

## 2020-11-06 ENCOUNTER — Inpatient Hospital Stay (HOSPITAL_COMMUNITY): Payer: Medicare Other

## 2020-11-06 ENCOUNTER — Inpatient Hospital Stay (HOSPITAL_COMMUNITY): Payer: Medicare Other | Admitting: Occupational Therapy

## 2020-11-06 LAB — GLUCOSE, CAPILLARY: Glucose-Capillary: 107 mg/dL — ABNORMAL HIGH (ref 70–99)

## 2020-11-06 LAB — URINALYSIS, ROUTINE W REFLEX MICROSCOPIC
Bilirubin Urine: NEGATIVE
Glucose, UA: NEGATIVE mg/dL
Hgb urine dipstick: NEGATIVE
Ketones, ur: NEGATIVE mg/dL
Nitrite: NEGATIVE
Protein, ur: NEGATIVE mg/dL
Specific Gravity, Urine: 1.01 (ref 1.005–1.030)
pH: 5 (ref 5.0–8.0)

## 2020-11-06 NOTE — Progress Notes (Signed)
Physical Therapy Session Note  Patient Details  Name: [name] MRN: [id number] Date of Birth: 1940/12/07  Today's Date: 11/06/2020 PT Individual Time:Session1: 4401-0272; [place]: 5366-4403 PT Individual Time Calculation (min): 57 min;   Short Term Goals: Week 3:  PT Short Term Goal 1 (Week 3): STG=LTG due to ELOS  Skilled Therapeutic Interventions/Progress Updates:    Session1:  Patient in recliner requesting to toilet.  Used [employer] video interpreter for first part of session until daughter arrived few minutes later.  Patient sit to stand with S and ambulated 15' to bathroom with RW and CGA to close S.  Toileted with CGA and ambulated to sink 20' with CGA/close S.  Performed oral hygiene and washed her face at sink with S.  Patient ambulated to w/c 12' with S.  In w/c assisted to therapy gym.  Patient negotiated 4 steps with L railing and min to mod A allowed daughter to assist and cue her with S.  Patient ambulated 20' with RW increased time.  Sit to supine with daughter cueing her min A.  Performed supine therex including heel slides, bridging, SLR, SAQ w/ 3#, hooklying hip abduction w/ orange t-band all 2 x 10 reps.  Attempted sidelying clamshell hip abduction on the R but pt reported too painful.  Supine to sit through partial R sidelying with min A and cues.  Patient sit to stand and ambulated toward hallway with w/c follow x 15' with increased time.  Assisted in w/c to room and stand step to recliner with S with RW.  Left in recliner with daughter in the room and call bell in reach.  Mentioned to nursing again about seeing if can get a urine test due to pt with frequent urination and slower moving today.  Session2:  Patient in recliner and daughter in the room who reports pt toileted twice since last session, but not yet able to get sample.  Sit to stand with S.  Tried out tray daughter brought for walker and noted pt with more difficulty walking with it attached as less space for her to step  forward, discussed using walker bag (daughter reports still has from last stay on rehab).  Ambulated 66' with RW and S close follow with w/c.  Reports more tired and so patient assisted in w/c to ortho gym.  Performed car transfer to simulated van height at 27" using step stool and min A for L LE.  Daughter present to discuss method with or without step stool, etc.  Donned pt's shoes as reports she wears them at home due to some increase pressure on toes since her stroke.  Patient reports harder due to heavier while wearing shoes.  Patient ambulated with shoes x 15' to mat with RW and S increased time.  SEated for UE therex with orange t-band rows, shoulder flexion with cues for technique and horizontal shoulder abduction all x 10.  Patient assisted to Nu Step with RW and performed 2 x 3 minute sets on Nu Step at level 1 UE/LE with rest in between.  Assisted to room in w/c and to toilet using grabbar with min A.  Pt able to urinate for sample and RN aware.  Patient ambulated 63' to recliner with RW and close S/CGA.  Left in recliner with daughter in the room and needs in reach.  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

## 2020-11-06 NOTE — Progress Notes (Signed)
Occupational Therapy Session Note  Patient Details  Name: [name] MRN: [id number] Date of Birth: 04-25-1941  Today's Date: 11/06/2020 OT Individual Time: 1300-1403 OT Individual Time Calculation (min): 63 min    Short Term Goals: Week 2:  OT Short Term Goal 1 (Week 2): Pt will complete sit<stand with CGA while engaged in LB dressing OT Short Term Goal 2 (Week 2): Pt will don overhead shirt with supervision assist OT Short Term Goal 3 (Week 2): Pt will participate in 1 grooming task while standing at the sink, without use of Stedy, to increase standing tolerance   Skilled Therapeutic Interventions/Progress Updates:    Pt greeted at time of session sitting up in recliner agreeable to OT session, daughter present and provided interpretation services. Pt did have R hip pain throughout 7/10, rest breaks PRN and RN providing mess pass at beginning of session. Recliner > toilet CGA with RW, CGA for toileting overall. CGA toilet > TTB in walk in, UB/LB bathing CGA overall with LHS for feet and back. Dried off with assist d/t being cold. UB dress Min A, LB dress Mod A overall to thread (reacher did not work for her d/t cognition), and min a to don over hips before walking to wheelchair CGA with RW occasional Min for weight shifting but pt stating she did not want the help. Also donned TEDS and socks total A. Standing grooming tasks at sink Supervision. Needing to toilet again at end of session, CGA overall again. Pt up in recliner with alarm on call bell in reach. Note that daughter present for session, provided physical assist and has been trained on how to assist the pt with ADLs at home.   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

## 2020-11-06 NOTE — Progress Notes (Signed)
Patient ID: [name], female   DOB: [birth year], 80 y.o.   MRN: [id number]  Daughter was here for family education today and feels it went well. Pt and daughter feel ready for discharge Monday. Wheelchair in her room for home.

## 2020-11-07 LAB — GLUCOSE, CAPILLARY
Glucose-Capillary: 124 mg/dL — ABNORMAL HIGH (ref 70–99)
Glucose-Capillary: 154 mg/dL — ABNORMAL HIGH (ref 70–99)

## 2020-11-07 LAB — URINE CULTURE: Culture: <10000 — AB

## 2020-11-07 NOTE — Progress Notes (Signed)
[place] PHYSICAL MEDICINE & REHABILITATION PROGRESS NOTE   Subjective/Complaints:  Talked to pt via tele-interpretor  Pt reports legs hurt, but hasn't taken pain meds this AM.  Asking to go to bathroom again- just asked 10 minutes ago, per NT- half the time, doesn't void, per NT- on Myrbetriq 50 mg daily- max dose.   Says pees even more at night- Also more tired than normal this AM   ROS: limited due to cognition/language  Objective:   No results found. No results for input(s): WBC, HGB, HCT, PLT in the last 72 hours. No results for input(s): NA, K, CL, CO2, GLUCOSE, BUN, CREATININE, CALCIUM in the last 72 hours.  Intake/Output Summary (Last 24 hours) at 11/07/2020 1209 Last data filed at 11/07/2020 0755 Gross per 24 hour  Intake 777 ml  Output -  Net 777 ml     Physical Exam: Vital Signs Blood pressure 118/72, pulse 62, temperature 97.7 F (36.5 C), temperature source Oral, resp. rate 17, height 4\' 10"  (1.473 m), weight 64.6 kg, SpO2 95 %. Gen: pt sitting up in bedside chair, asking for bathroom, NAD HEENT: oral mucosa pink and moist, NCAT Cardio: irregular rhythm- rate controlled Chest: CTA B/L- no W/R/R- good air movement Abd: Soft, NT, ND, (+)BS   Ext: no edema Psych: constantly asking for bathroom- irritable this AM Musculoskeletal: TTP over R groin/R hip- no change- also TTP over inner thighs- stable, but CP TTP is resolved    Comments: UEs 5-/5 in RUE-  but appears 5-/5 LUE- 4/5 in LUE in same muscles tested RLE- HF 1/5? Pain; KE 2-/5, pain?, DF and PF at least 4/5, but limited by pain as well LLE- HF 2/5? Pain and weak; KE 3-/5, DF and PF 3-/5  Skin: bruising- purple/yellow on R>L inner thighs- no hematoma identified- mild swelling associated--stable to improved Neurological: Ox2  Assessment/Plan: 1. Functional deficits which require 3+ hours per day of interdisciplinary therapy in a comprehensive inpatient rehab setting.  Physiatrist is providing close  team supervision and 24 hour management of active medical problems listed below.  Physiatrist and rehab team continue to assess barriers to discharge/monitor patient progress toward functional and medical goals  Care Tool:  Bathing    Body parts bathed by patient: Right arm,Left arm,Chest,Abdomen,Front perineal area,Right upper leg,Left upper leg,Face,Right lower leg,Left lower leg,Buttocks   Body parts bathed by helper: Buttocks     Bathing assist Assist Level: Contact Guard/Touching assist     Upper Body Dressing/Undressing Upper body dressing   What is the patient wearing?: Pull over shirt    Upper body assist Assist Level: Minimal Assistance - Patient > 75%    Lower Body Dressing/Undressing Lower body dressing      What is the patient wearing?: Pants,Incontinence brief     Lower body assist Assist for lower body dressing: Moderate Assistance - Patient 50 - 74%     Toileting Toileting    Toileting assist Assist for toileting: Contact Guard/Touching assist     Transfers Chair/bed transfer  Transfers assist     Chair/bed transfer assist level: Contact Guard/Touching assist     Locomotion Ambulation   Ambulation assist      Assist level: Contact Guard/Touching assist Assistive device: Walker-rolling Max distance: 30'   Walk 10 feet activity   Assist  Walk 10 feet activity did not occur: Safety/medical concerns  Assist level: Supervision/Verbal cueing Assistive device: Walker-rolling   Walk 50 feet activity   Assist Walk 50 feet with 2 turns activity  did not occur: Safety/medical concerns  Assist level: Supervision/Verbal cueing Assistive device: Walker-rolling    Walk 150 feet activity   Assist Walk 150 feet activity did not occur: Safety/medical concerns         Walk 10 feet on uneven surface  activity   Assist Walk 10 feet on uneven surfaces activity did not occur: Safety/medical concerns          Wheelchair     Assist Will patient use wheelchair at discharge?: Yes Type of Wheelchair: Manual    Wheelchair assist level: Supervision/Verbal cueing Max wheelchair distance: 60    Wheelchair 50 feet with 2 turns activity    Assist    Wheelchair 50 feet with 2 turns activity did not occur: Safety/medical concerns   Assist Level: Supervision/Verbal cueing   Wheelchair 150 feet activity     Assist  Wheelchair 150 feet activity did not occur: Safety/medical concerns       Blood pressure 118/72, pulse 62, temperature 97.7 F (36.5 C), temperature source Oral, resp. rate 17, height 4\' 10"  (1.473 m), weight 64.6 kg, SpO2 95 %.  Medical Problem List and Plan: 1.  Decreased functional mobility secondary to right superior and inferior pubic rami fracture nondisplaced after a fall.  Weightbearing as tolerated.  Follow-up Dr.             -patient may  shower  1/10- explained sore legs and stiffness is normal- take prn pain meds             -ELOS/Goals: 10-14 days- supervision to [facility]  1/13- D/c date moved to 1/24 due to daughter's schedule.   Continue CIR 2.  Antithrombotics: -DVT/anticoagulation: Continue chronic Eliquis             -antiplatelet therapy: N/A 3. Pain Management: Neurontin 100 mg nightly as needed, Lidoderm patch as directed oxycodone and Zanaflex as needed- will add tramadol 50 mg q6 hours prn since pt says Percocet wears off too fast.   1/5- will add lidoderm patches 2 patches 12 hrs on;12 hrs off- 8am to 8pm  1/13-14:- pain controlled-  continue regimen. Wean Percocet to 6H PRN  1/15: pain is well controlled: wean Percocet to q8H prn  1/20- will change percocet to q8 hours prn per pt request  1/22- reminded pt to ask for pain meds- don't work if not taking them- con't regimen 4. Mood: Provide emotional support             -antipsychotic agents: N/A 5. Neuropsych: This patient is? capable of making decisions on her own behalf. 6. Skin/Wound  Care: Routine skin checks 7. Fluids/Electrolytes/Nutrition: Routine in and outs with follow-up chemistries 8.  Chronic atrial fibrillation.  Continue Eliquis.  Cardiac rate controlled.  Lopressor 12.5 mg twice daily  1/9 rate up a little this morning, has frequently been close to 100   -will increase lopressor to 25mg  bid which should also help with CP  1/18- HR 80s- con't regimen  1/19- HR 100s this AM, but was doing car transfers- con't regimen  1/22- rate 60s this AM- con't regimen 9.  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  Received CIR 10/19/2016 to 11/01/2016.  Patient used a rolling walker prior to admission. 10.  Diabetes mellitus.  Hemoglobin A1c 6.4.  Metformin resumed 500 mg twice daily  1/5- restarted metformin and stopped insulin this AM- didn't give it-  1/17- BGs-  93- 143- con't regimen  1/18- BG was 62 before lunch yesterday- con't daily BGs- will make sure has hypoglycemia protocol  1/19- BGs 82 yesterday- con't regimen  1/20- BG 70 yesterday late morning- decrease metformin to 250 mg BID with meals  1/22- BGs 107-154- con't decreased dose of metformin 11.  Hyperlipidemia.  Pravachol 12.  GERD.  Protonix 13.  Constipation.  Colace 100 mg twice daily, MiraLAX as needed- also senokot 1 tab BID- if doesn't work, will do sorbitol tomorrow  1/5- will give sorbitol this evening  1/6- had multiple BMs last night- con't regimen  1/12- says LBM 2 days ago- but isn't eating-  1/13- now says feels constipated- says it's now 4 days since LBM - likely 3 days due to calculations yesterday- will give sorbitol today after therapy.  14. Urinary frequency- will give purewick at night.  1/5- working well- con't purewick, but only at night.   1/6- didn't use purewick last night because frequent stools. But will nightly  1/17- asked to go to bathroom q20 minutes per staff;   1/18- LBM  yesterday right after midnight- but pt thinks it's been 3-4 days- was large/documented  1/19- LBM 2 days ago  1/20- LBM 3 days ago- will increase bowel meds and give sorbitol again. After therapy- increased senokot S to TID 15. Chest pain  1/7- gave NTG x2- asked nursing to give 1 more if possible- EKG is OK upon my reading of it- just shows Afib- Troponin done- had been ~4 a few days ago- is now 6- feeling much better with NTG- will monitor closely- if gets worse, will call Cards again.      1/9 having intermittent cp relieved by nitro.    -increase metoprolol as above   -continue prn sl nitro   -will check another EKG    -question anxiety component also    -will add small dose prn xanax  1/10- chest pain/tightness 7/10 per pt- also SOB (doesn't look SOB), and fatigued- will order another troponins and EKG and called Cards to see her, since recurrent.   1/11- Cards has ordered stress testing for this AM- is NPO for this- await results/cards input.  1/12- will optimize medical mgmt- Cards thinking about Left heart cath??  1/17- Cards appeared to have signed off- no more Chest pain- does c/o mild SOB, but appears comfortable and O2 sats look good- con't regimen  1/19- pt looking great- went over stress test with daughter- the results and that doesn't appear to need L heart cath.  16. Poor appetite-   1/11- will check if due to constipation- check KUB; if (-), might need to start something for appetite- like Remeron?  1/12- will start Remeron 15 mg QHS for sleep/appetite- pt scared will get "heavy"- explained she's not eating a lot- needs to eat a little more.   1/14: continue remeron  1/18- not sure helping appetite, but sleeping well  1/19- has been sleepy a lot- but not today- might need to stop Remeron?  1/20- stop Remeron- has been too sleepy with it.  17. Urinary frequency  1/22- on 50 mg daily of Myrbetriq- highest dose- don't have another treatment for going every 10-15 minutes except  foley- family refuses foley.   LOS: 18 days A FACE TO FACE EVALUATION WAS PERFORMED  [name] 11/07/2020, 12:09 PM

## 2020-11-08 ENCOUNTER — Inpatient Hospital Stay (HOSPITAL_COMMUNITY): Payer: Medicare Other

## 2020-11-08 LAB — GLUCOSE, CAPILLARY
Glucose-Capillary: 121 mg/dL — ABNORMAL HIGH (ref 70–99)
Glucose-Capillary: 74 mg/dL (ref 70–99)

## 2020-11-08 NOTE — Progress Notes (Signed)
Occupational Therapy Session Note  Patient Details  Name: [name] MRN: [id number] Date of Birth: 09-24-1941  Today's Date: 11/08/2020 OT Individual Time: 4098-1191 OT Individual Time Calculation (min): 45 min    Short Term Goals: Week 2:  OT Short Term Goal 1 (Week 2): Pt will complete sit<stand with CGA while engaged in LB dressing OT Short Term Goal 2 (Week 2): Pt will don overhead shirt with supervision assist OT Short Term Goal 3 (Week 2): Pt will participate in 1 grooming task while standing at the sink, without use of Stedy, to increase standing tolerance  Skilled Therapeutic Interventions/Progress Updates:    Pt received standing with NT assisting pt to the bathroom. NT reporting frequent bathroom trips. Pt completed functional mobility with the RW into the bathroom with supervision overall, very slow pace. [employer] interpretor used during session. Pt completed toileting tasks with supervision, no audible urine void. Discussed recommendation to keep BSC close to chair at home vs ambulating to the toilet when urination is this frequent. Pt returned to the recliner and declined shower but agreed to change her clothes. She completed UB dressing with supervision. LB dressing required min A. Pt required rest break and then completed ambulatory transfer to the sink where she stood for oral care and grooming tasks. She returned to the recliner and reported 9/10 pain in her R thigh- RN alerted to request for pain medication. Pt was left sitting up with chair alarm belt fastened, all needs met.   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

## 2020-11-08 NOTE — Progress Notes (Signed)
[facility] PHYSICAL MEDICINE & REHABILITATION PROGRESS NOTE   Subjective/Complaints:   Per staff, pt still asking to go to bathroom multiple times per hour- sounds like more to get attention/someone in room at least part of time.   Pt reports hasn't taken pain meds since yesterday- legs hurt again this AM.     OZD:[id number] due to cognition/cognition  Objective:   No results found. No results for input(s): WBC, HGB, HCT, PLT in the last 72 hours. No results for input(s): NA, K, CL, CO2, GLUCOSE, BUN, CREATININE, CALCIUM in the last 72 hours.  Intake/Output Summary (Last 24 hours) at 11/08/2020 1240 Last data filed at 11/08/2020 0756 Gross per 24 hour  Intake 600 ml  Output -  Net 600 ml     Physical Exam: Vital Signs Blood pressure (!) 110/51, pulse 67, temperature 97.9 F (36.6 C), resp. rate 16, height 4\' 10"  (1.473 m), weight 64.6 kg, SpO2 96 %. Gen: pt sitting up in bedside chair, asking for bathroom- literally just had gotten back in chair from NT, NAD HEENT: oral mucosa pink and moist, NCAT Cardio: irregular rhythm, rate controlled Chest: CTA B/L- no W/R/R- good air movement Abd: Soft, NT, ND, (+)BS  Ext: no edema Psych: constantly asking for bathroom-still- 2x in last 15 minutes Musculoskeletal: TTP over R groin/R hip- no change- also TTP over inner thighs- stable, but CP TTP is resolved    Comments: UEs 5-/5 in RUE-  but appears 5-/5 LUE- 4/5 in LUE in same muscles tested RLE- HF 1/5? Pain; KE 2-/5, pain?, DF and PF at least 4/5, but limited by pain as well LLE- HF 2/5? Pain and weak; KE 3-/5, DF and PF 3-/5  Skin: bruising- purple/yellow on R>L inner thighs- no hematoma identified- mild swelling associated--stable to improved Neurological: Ox2- perseverative over urination/bathroom  Assessment/Plan: 1. Functional deficits which require 3+ hours per day of interdisciplinary therapy in a comprehensive inpatient rehab setting.  Physiatrist is providing close team  supervision and 24 hour management of active medical problems listed below.  Physiatrist and rehab team continue to assess barriers to discharge/monitor patient progress toward functional and medical goals  Care Tool:  Bathing    Body parts bathed by patient: Right arm,Left arm,Chest,Abdomen,Front perineal area,Right upper leg,Left upper leg,Face,Right lower leg,Left lower leg,Buttocks   Body parts bathed by helper: Buttocks     Bathing assist Assist Level: Contact Guard/Touching assist     Upper Body Dressing/Undressing Upper body dressing   What is the patient wearing?: Pull over shirt    Upper body assist Assist Level: Minimal Assistance - Patient > 75%    Lower Body Dressing/Undressing Lower body dressing      What is the patient wearing?: Pants,Incontinence brief     Lower body assist Assist for lower body dressing: Minimal Assistance - Patient > 75%     Toileting Toileting    Toileting assist Assist for toileting: Contact Guard/Touching assist     Transfers Chair/bed transfer  Transfers assist     Chair/bed transfer assist level: Supervision/Verbal cueing     Locomotion Ambulation   Ambulation assist      Assist level: Contact Guard/Touching assist Assistive device: Walker-rolling Max distance: 30'   Walk 10 feet activity   Assist  Walk 10 feet activity did not occur: Safety/medical concerns  Assist level: Supervision/Verbal cueing Assistive device: Walker-rolling   Walk 50 feet activity   Assist Walk 50 feet with 2 turns activity did not occur: Safety/medical concerns  Assist level:  Supervision/Verbal cueing Assistive device: Walker-rolling    Walk 150 feet activity   Assist Walk 150 feet activity did not occur: Safety/medical concerns         Walk 10 feet on uneven surface  activity   Assist Walk 10 feet on uneven surfaces activity did not occur: Safety/medical concerns         Wheelchair     Assist Will  patient use wheelchair at discharge?: Yes Type of Wheelchair: Manual    Wheelchair assist level: Supervision/Verbal cueing Max wheelchair distance: 60    Wheelchair 50 feet with 2 turns activity    Assist    Wheelchair 50 feet with 2 turns activity did not occur: Safety/medical concerns   Assist Level: Supervision/Verbal cueing   Wheelchair 150 feet activity     Assist  Wheelchair 150 feet activity did not occur: Safety/medical concerns       Blood pressure (!) 110/51, pulse 67, temperature 97.9 F (36.6 C), resp. rate 16, height 4\' 10"  (1.473 m), weight 64.6 kg, SpO2 96 %.  Medical Problem List and Plan: 1.  Decreased functional mobility secondary to right superior and inferior pubic rami fracture nondisplaced after a fall.  Weightbearing as tolerated.  Follow-up Dr.             -patient may  shower  1/10- explained sore legs and stiffness is normal- take prn pain meds             -ELOS/Goals: 10-14 days- supervision to [facility]  1/13- D/c date moved to 1/24 due to daughter's schedule.   Continue CIR 2.  Antithrombotics: -DVT/anticoagulation: Continue chronic Eliquis             -antiplatelet therapy: N/A 3. Pain Management: Neurontin 100 mg nightly as needed, Lidoderm patch as directed oxycodone and Zanaflex as needed- will add tramadol 50 mg q6 hours prn since pt says Percocet wears off too fast.   1/5- will add lidoderm patches 2 patches 12 hrs on;12 hrs off- 8am to 8pm  1/13-14:- pain controlled-  continue regimen. Wean Percocet to [facility] PRN  1/23- encouraged pt to ask for pain meds- con't regimen 4. Mood: Provide emotional support             -antipsychotic agents: N/A 5. Neuropsych: This patient is? capable of making decisions on her own behalf. 6. Skin/Wound Care: Routine skin checks 7. Fluids/Electrolytes/Nutrition: Routine in and outs with follow-up chemistries 8.  Chronic atrial fibrillation.  Continue Eliquis.  Cardiac rate controlled.  Lopressor 12.5 mg  twice daily  1/9 rate up a little this morning, has frequently been close to 100   -will increase lopressor to 25mg  bid which should also help with CP  1/18- HR 80s- con't regimen  1/19- HR 100s this AM, but was doing car transfers- con't regimen  1/23- rate controlled- con't regimen 9.  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  Received CIR 10/19/2016 to 11/01/2016.  Patient used a rolling walker prior to admission. 10.  Diabetes mellitus.  Hemoglobin A1c 6.4.  Metformin resumed 500 mg twice daily  1/5- restarted metformin and stopped insulin this AM- didn't give it-   1/17- BGs-  93- 143- con't regimen  1/18- BG was 62 before lunch yesterday- con't daily BGs- will make sure has hypoglycemia protocol  1/19- BGs 82 yesterday- con't regimen  1/20- BG 70 yesterday late morning- decrease  metformin to 250 mg BID with meals  1/22- BGs 107-154- con't decreased dose of metformin  1/23- BGs 74- 154 in last 24 hours- con't lower dose of metformin 11.  Hyperlipidemia.  Pravachol 12.  GERD.  Protonix 13.  Constipation.  Colace 100 mg twice daily, MiraLAX as needed- also senokot 1 tab BID- if doesn't work, will do sorbitol tomorrow  1/5- will give sorbitol this evening  1/6- had multiple BMs last night- con't regimen  1/12- says LBM 2 days ago- but isn't eating-  1/13- now says feels constipated- says it's now 4 days since LBM - likely 3 days due to calculations yesterday- will give sorbitol today after therapy.  14. Urinary frequency- will give purewick at night.  1/5- working well- con't purewick, but only at night.   1/6- didn't use purewick last night because frequent stools. But will nightly  1/17- asked to go to bathroom q20 minutes per staff;   1/18- LBM yesterday right after midnight- but pt thinks it's been 3-4 days- was large/documented  1/19- LBM 2 days ago  1/20- LBM 3 days ago- will  increase bowel meds and give sorbitol again. After therapy- increased senokot S to TID  1/23- LBM this AM- con't regimen 15. Chest pain  1/7- gave NTG x2- asked nursing to give 1 more if possible- EKG is OK upon my reading of it- just shows Afib- Troponin done- had been ~4 a few days ago- is now 6- feeling much better with NTG- will monitor closely- if gets worse, will call Cards again.      1/9 having intermittent cp relieved by nitro.    -increase metoprolol as above   -continue prn sl nitro   -will check another EKG    -question anxiety component also    -will add small dose prn xanax  1/10- chest pain/tightness 7/10 per pt- also SOB (doesn't look SOB), and fatigued- will order another troponins and EKG and called Cards to see her, since recurrent.   1/11- Cards has ordered stress testing for this AM- is NPO for this- await results/cards input.  1/12- will optimize medical mgmt- Cards thinking about Left heart cath??  1/17- Cards appeared to have signed off- no more Chest pain- does c/o mild SOB, but appears comfortable and O2 sats look good- con't regimen  1/19- pt looking great- went over stress test with daughter- the results and that doesn't appear to need L heart cath.  16. Poor appetite-   1/11- will check if due to constipation- check KUB; if (-), might need to start something for appetite- like Remeron?  1/12- will start Remeron 15 mg QHS for sleep/appetite- pt scared will get "heavy"- explained she's not eating a lot- needs to eat a little more.   1/14: continue remeron  1/18- not sure helping appetite, but sleeping well  1/19- has been sleepy a lot- but not today- might need to stop Remeron?  1/20- stop Remeron- has been too sleepy with it.  17. Urinary frequency  1/22- on 50 mg daily of Myrbetriq- highest dose- don't have another treatment for going every 10-15 minutes except foley- family refuses foley.  1/23- think some of this is she's lonely and wants someone with her? She  doesn't void or even try to ~25-40% of time per NT.    LOS: 19 days A FACE TO FACE EVALUATION WAS PERFORMED  Tamyia Minich 11/08/2020, 12:40 PM

## 2020-11-08 NOTE — Discharge Summary (Signed)
Physician Discharge Summary  Patient ID: [name] MRN: [id number] DOB/AGE: 1941-05-09 80 y.o.  Admit date: 10/20/2020 Discharge date: 11/09/2020  Discharge Diagnoses:  Principal Problem:   Pelvic fracture [facility]) Active Problems:   Precordial pain Pain management Chronic atrial fibrillation History of SDH as well as history of right putamen caudate right insular CVA Hyperlipidemia GERD Constipation Urinary urgency Mood stabilization OSA  Discharged Condition: Stable  Significant Diagnostic Studies: DG Pelvis 1-2 Views  Result Date: 10/17/2020 CLINICAL DATA:  Fall with right hip pain EXAM: PELVIS - 1-2 VIEW COMPARISON:  01/16/2020 FINDINGS: Both femoral heads project in joint. Pubic symphysis is intact. Suspected acute fractures of the right superior and inferior pubic rami. There may be associated lucent lesion within the right inferior pubic ramus. IMPRESSION: Suspected acute fractures of the right superior and inferior pubic rami with possible associated lucent lesion in the right inferior pubic ramus. Electronically Signed   By: Jasmine Pang M.D.   On: 10/17/2020 17:46   DG Forearm Right  Result Date: 10/17/2020 CLINICAL DATA:  Right forearm pain after fall EXAM: RIGHT FOREARM - 2 VIEW COMPARISON:  None. FINDINGS: There is no evidence of fracture or other focal bone lesions. Soft tissues are unremarkable. IMPRESSION: Negative. Electronically Signed   By: Duanne Guess D.O.   On: 10/17/2020 17:44   DG Abd 1 View  Result Date: 10/27/2020 CLINICAL DATA:  Abdominal pain.  Loss of appetite. EXAM: ABDOMEN - 1 VIEW COMPARISON:  10/17/2020 FINDINGS: Nonobstructive bowel gas pattern. Small to moderate amount of stool in the right colon. Mild curvature in the thoracolumbar spine. Again noted are bilateral pubic rami fractures. IMPRESSION: 1. Normal bowel gas pattern. 2. Pelvic fractures. Electronically Signed   By: Richarda Overlie M.D.   On: 10/27/2020 10:27   CT Head Wo Contrast  Result  Date: 10/17/2020 CLINICAL DATA:  Fall.  Anticoagulated EXAM: CT HEAD WITHOUT CONTRAST TECHNIQUE: Contiguous axial images were obtained from the base of the skull through the vertex without intravenous contrast. COMPARISON:  None. FINDINGS: Brain: Diffuse cerebral atrophy. Ventricular dilatation consistent with central atrophy. Low-attenuation changes in the deep white matter consistent small vessel ischemic change. Old lacunar infarcts in the deep white matter. No mass effect or midline shift. No abnormal extra-axial fluid collections. Gray-white matter junctions are distinct. Basal cisterns are not effaced. No acute intracranial hemorrhage. Basal ganglia calcifications. Vascular: Moderate intracranial arterial vascular calcifications. Skull: Calvarium appears intact. Sinuses/Orbits: Paranasal sinuses and mastoid air cells are clear. Other: None. IMPRESSION: 1. No acute intracranial abnormalities. 2. Chronic atrophy and small vessel ischemic changes. Old lacunar infarcts in the deep white matter. Electronically Signed   By: Burman Nieves M.D.   On: 10/17/2020 17:09   CT PELVIS WO CONTRAST  Result Date: 10/17/2020 CLINICAL DATA:  Fall. EXAM: CT PELVIS WITHOUT CONTRAST TECHNIQUE: Multidetector CT imaging of the pelvis was performed following the standard protocol without intravenous contrast. COMPARISON:  January 18, 2008. FINDINGS: Urinary Tract:  No abnormality visualized. Bowel:  Unremarkable visualized pelvic bowel loops. Vascular/Lymphatic: No pathologically enlarged lymph nodes. No significant vascular abnormality seen. Reproductive: Status post hysterectomy. No adnexal abnormality is noted. Other:  No hernia or abnormal fluid collection is noted. Musculoskeletal: Moderately displaced and comminuted fracture is seen involving the right inferior pubic ramus. There is a nondisplaced fracture involving the right superior pubic ramus at its junction with the anterior acetabulum. IMPRESSION: 1. Moderately displaced  and comminuted fracture is seen involving the right inferior pubic ramus. 2. Nondisplaced fracture involving the  right superior pubic ramus at its junction with the anterior acetabulum. Electronically Signed   By: Lupita RaiderJames  Green Jr M.D.   On: 10/17/2020 18:21   NM Myocar Multi W/Spect W/Wall Motion / EF  Result Date: 10/28/2020 CLINICAL DATA:  80 year old female with history chest pain EXAM: MYOCARDIAL IMAGING WITH SPECT (REST AND PHARMACOLOGIC-STRESS) GATED LEFT VENTRICULAR WALL MOTION STUDY LEFT VENTRICULAR EJECTION FRACTION TECHNIQUE: Standard myocardial SPECT imaging was performed after resting intravenous injection of 10.6 mCi Tc-3835m tetrofosmin. Subsequently, intravenous infusion of Lexiscan was performed under the supervision of the Cardiology staff. At peak effect of the drug, 32.6 mCi Tc-9635m tetrofosmin was injected intravenously and standard myocardial SPECT imaging was performed. Quantitative gated imaging was also performed to evaluate left ventricular wall motion, and estimate left ventricular ejection fraction. COMPARISON:  None. FINDINGS: Perfusion: No decreased activity in the left ventricle on stress imaging to suggest reversible ischemia or infarction. Wall Motion: Normal left ventricular wall motion. No left ventricular dilation. Left Ventricular Ejection Fraction: 79 % End diastolic volume 47 ml End systolic volume 10 ml IMPRESSION: 1. No reversible ischemia or infarction. 2. Normal left ventricular wall motion. 3. Left ventricular ejection fraction 79% 4. Non invasive risk stratification*: Low *2012 Appropriate Use Criteria for Coronary Revascularization Focused Update: J Am Coll Cardiol. 2012;59(9):857-881. http://content.dementiazones.comonlinejacc.org/article.aspx?articleid=1201161 Electronically Signed   By: Gilmer MorJaime  Wagner D.O.   On: 10/28/2020 09:14   DG CHEST PORT 1 VIEW  Result Date: 10/19/2020 CLINICAL DATA:  Shortness of breath. EXAM: PORTABLE CHEST 1 VIEW COMPARISON:  02/12/2017 and CT chest  05/10/2019. FINDINGS: Trachea is midline. Heart is enlarged. Thoracic aorta is calcified. Lungs are low in volume with mild interstitial prominence and indistinctness. There may be left infrahilar airspace consolidation. Suspect a tiny left pleural effusion. IMPRESSION: 1. Low lung volumes with pulmonary vascular congestion and a tiny left pleural effusion. 2. Left infrahilar airspace opacification may be due to atelectasis. Difficult to exclude pneumonia. 3.  Aortic atherosclerosis (ICD10-I70.0). Electronically Signed   By: Leanna BattlesMelinda  Blietz M.D.   On: 10/19/2020 07:57   DG Knee Right Port  Result Date: 10/17/2020 CLINICAL DATA:  Fall with right knee pain EXAM: PORTABLE RIGHT KNEE - 1-2 VIEW COMPARISON:  None. FINDINGS: No acute displaced fracture or malalignment. Trace knee effusion. Mild tricompartment arthritis of the knee. IMPRESSION: No acute osseous abnormality. Electronically Signed   By: Jasmine PangKim  Fujinaga M.D.   On: 10/17/2020 19:55   DG Abd Portable 1V  Result Date: 10/17/2020 CLINICAL DATA:  Right knee pain EXAM: PORTABLE ABDOMEN - 1 VIEW COMPARISON:  None. FINDINGS: The bowel gas pattern is normal. No radio-opaque calculi or other significant radiographic abnormality are seen. IMPRESSION: Negative. Electronically Signed   By: Jonna ClarkBindu  Avutu M.D.   On: 10/17/2020 19:57   DG Hand Complete Right  Result Date: 10/17/2020 CLINICAL DATA:  Right hand pain after fall EXAM: RIGHT HAND - COMPLETE 3+ VIEW COMPARISON:  None. FINDINGS: No acute fracture. No dislocation. Mild degenerative changes throughout the hand and wrist. No focal erosion. Soft tissues within normal limits. IMPRESSION: No acute findings. Mild degenerative changes. Electronically Signed   By: Duanne GuessNicholas  Plundo D.O.   On: 10/17/2020 17:43   DG FEMUR, MIN 2 VIEWS RIGHT  Result Date: 10/17/2020 CLINICAL DATA:  Fall with right hip pain EXAM: RIGHT FEMUR 2 VIEWS COMPARISON:  01/16/2020 FINDINGS: No fracture or malalignment of the right femur. The  right femoral head projects in joint. Suspected acute fractures involving the right superior and inferior pubic ramus. Questionable subtle  lucent lesion in the right inferior pubic ramus. IMPRESSION: 1. Suspected acute fractures involving the right superior and inferior pubic rami. 2. Possible lucent lesion in the right inferior pubic ramus, raising concern for pathologic fracture Electronically Signed   By: Jasmine Pang M.D.   On: 10/17/2020 17:45    Labs:  Basic Metabolic Panel: No results for input(s): NA, K, CL, CO2, GLUCOSE, BUN, CREATININE, CALCIUM, MG, PHOS in the last 168 hours.  CBC: No results for input(s): WBC, NEUTROABS, HGB, HCT, MCV, PLT in the last 168 hours.  CBG: Recent Labs  Lab 11/06/20 1156 11/07/20 0602 11/07/20 1136 11/08/20 0606 11/08/20 1136  GLUCAP 107* 124* 154* 121* 74   Family history.  Negative for diabetes or hypertension.  Denies any colon cancer esophageal cancer or rectal cancer  Brief HPI:   Neela Zecca is a 80 y.o. 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  Per chart review lives with her daughter and son-in-law.  Two-level home.  Independent with assistive device.  Presented 10/17/2020 after mechanical fall while using her walker landing on her right hip.  No loss of conscious.  Cranial CT scan negative for acute changes.  X-rays and imaging revealed moderately displaced and comminuted fracture involving the right inferior pubic ramus.  Nondisplaced fracture involving the right superior pubic ramus at its junction with the anterior acetabulum.  Orthopedic service follow-up Dr. Dion Saucier conservative care no surgical intervention  weightbearing as tolerated.  Admission chemistries unremarkable except WBC 12,100 glucose 138 hemoglobin A1c 6.5 troponin negative.  She remained on Eliquis as prior to admission.  Pain control with the use of Lidoderm patch as needed as well as oxycodone and Zanaflex.  Due to patient decrease in functional ability history of CVA left side weakness physical medicine rehab consult requested and patient was admitted for a comprehensive rehab program.   Hospital Course: [facility] was admitted to rehab 10/20/2020 for inpatient therapies to consist of PT, ST and OT at least three hours five days a week. Past admission physiatrist, therapy team and rehab RN have worked together to provide customized collaborative inpatient rehab.  Pertaining to patient's right superior inferior pubic ramus fracture nondisplaced after a fall weightbearing as tolerated neurovascular sensation intact she would follow-up outpatient Dr. Dion Saucier.  Chronic atrial fibrillation maintained on Eliquis no bleeding episodes as well as Lopressor.  Patient did have episodic chest pain did receive nitroglycerin EKG unremarkable follow-up cardiology services myocardial imaging completed 10/27/2020 showing no decrease activity in the left ventricle on stress imaging to suggest reversible ischemia or infarction.  Pain management maintained on Neurontin 100 mg nightly as needed, Fioricet for headaches a Lidoderm patch was established she was using oxycodone for breakthrough pain as well as Zanaflex as needed for muscle spasms and tramadol as needed for modest pain.  Blood sugars overall controlled hemoglobin A1c 6.4 Glucophage as directed.  Pravachol ongoing for hyperlipidemia.  Urinary frequency maintained on myrbetriq.  Bouts of constipation resolved with laxative assistance.   Blood pressures were monitored on TID basis and controlled  Diabetes has been monitored with ac/hs CBG checks and SSI was use prn for tighter BS control.    Rehab course:  During patient's stay in rehab weekly team conferences were  held to monitor patient's progress, set goals and discuss barriers to discharge. At admission, patient required moderate assist 12 feet rolling walker minimal assist sit to stand  Physical exam.  Blood pressure 108/57 pulse 90 temperature 97.5 respirations 15 oxygen saturation 97% room air Constitutional.  No acute distress HEENT Head.  Normocephalic and atraumatic Eyes.  Pupils round and reactive to light no discharge without nystagmus Neck.  Supple nontender no JVD without thyromegaly Cardiac irregular irregular Abdomen.  Soft nontender positive bowel sounds without rebound Respiratory effort normal no respiratory distress without wheeze Musculoskeletal normal range of motion no rigidity Comments.  Upper extremities 5/5 right upper extremity Left upper extremity 4/5 in left upper extremity in same muscles tested Right lower extremity hip flexors 1/5 pain inhibited knee extension to minus/5 pain inhibited dorsi plantarflexion least 4/5 Left lower extremity hip flexion 2/5 question pain related knee extension 3 -/5 dorsiflexion plantarflexion 3 -/5 Skin.  Warm and intact Neurologic.  Alert no acute distress makes eye contact with examiner there is a language barrier.  He/She  has had improvement in activity tolerance, balance, postural control as well as ability to compensate for deficits. He/She has had improvement in functional use RUE/LUE  and RLE/LLE as well as improvement in awareness.  Patient sit to stand with supervision ambulates 15 feet to the bathroom rolling walker close supervision.  Toileting with contact-guard ambulates to the sink contact-guard close supervision.  Performed oral hygiene washed her face at sink with supervision.  Supine to sit through partial right side-lying with minimal assist and cues.  Perform car transfers to simulated van height 27 inches using deep stool and minimal assist for left lower extremity.   ADLs recliner toilet contact-guard rolling walker contact-guard assist for toileting overall contact-guard assist toilet-TTB and walk in upper lower body bathing.  Full family teaching completed plan discharge to home       Disposition: Discharge to home    Diet: Regular consistency  Special Instructions: No driving smoking or alcohol  Weightbearing as tolerated  Medications at discharge 1.  Tylenol as needed 2.  Eliquis 5 mg twice daily 3.  Vitamin D 2000 units daily 4.  Neurontin 100 mg nightly as needed 5.xoponex inhaler 1 puff every 4 hours as needed shortness of breath 6.  Lidoderm patch 2 patches change as directed 7.  Glucophage 250 mg p.o. twice daily 8.  Lopressor 25 mg p.o. twice daily 9.Myrbetriq 50 mg nightly 10.  Nitroglycerin as needed 11.  Protonix 40 mg daily 12.  Senokot 1 tablet 3 times daily hold for loose stools 13.  Pravachol 20 mg nightly 14.  Zanaflex 2 mg every 8 hours as needed muscle spasms 15.  Oxycodone 1 tablet every 8 hours as needed pain 16.  Tramadol 50 mg every 6 hours as needed moderate pain.  Dispense of 10 tablets  30-35 minutes were spent completing discharge summary and discharge planning     Follow-up Information    Lovorn, Aundra MilletMegan, MD Follow up.   Specialty: Physical Medicine and Rehabilitation Why: No follow-up needed Contact information: [address] [place] [address] [phone]        Teryl LucyLandau, Joshua, MD Follow up.   Specialty: Orthopedic Surgery Why: Call for appointment Contact information: [address] [place] [address] [phone]        Azalee CourseMeng, Hao, [place]PA Follow up on 11/12/2020.   Specialties: Cardiology, Radiology Why: at 8:15am  Contact information: [address]  [place] [address] [phone]               Signed: Mcarthur Rossetti Rosaura Bolon 11/09/2020, 5:04 AM

## 2020-11-08 NOTE — Progress Notes (Signed)
Physical Therapy Discharge Summary  Patient Details  Name: [name] MRN: [id number] Date of Birth: 1941-03-11  Today's Date: 11/08/2020 PT Individual Time: 1030-1120 PT Individual Time Calculation (min): 50 min    Patient has met 8 of 11 long term goals due to improved activity tolerance, decreased pain, ability to compensate for deficits and functional use of  right lower extremity.  Patient to discharge at an ambulatory level Supervision.   Patient's care partner is independent to provide the necessary cognitive assistance at discharge.  Reasons goals not met: pain, previous CVA  Recommendation:  Patient will benefit from ongoing skilled PT services in home health setting to continue to advance safe functional mobility, address ongoing impairments in pain, strength, activity tolerance, bed mobility, gait distance, and minimize fall risk.  Equipment: RW  Reasons for discharge:most treatment goals met and discharge from hospital  Patient/family agrees with progress made and goals achieved: Yes  PT Discharge Tx today: pt seated in recliner.  With use of FACIEs scale, pt rated R thigh/hip pain 8/10.  Pt provided with frequent seated rest breaks to address pain.  Use of AMD video interpreter during session.  Therapeutic exercises performed with LE to increase strength for functional mobility.seated in recliner, bil ankle pumps, R/L long arc quad knee extension and minimal R/L hip flexion to prepare for standing/gait.    Pt reported that she needed to use toilet.  Gait in room to/from toilet and sink, RW, close supervision.  Toilet transfer to [facility] over toilet with supervision.  Pt continent of urine.  Clothing mgt and peri care with supervision.  Hand washing, oral care and face washing at sink from wc level with supervision and set up.  WC propulsion using bil UEs on level tile, x 50' without turns, supervision.  Pt demonstrates difficulties with motor control L hand for effective pushing on  wheel rim.   Simulated car transfer with supervision; pt slowly able to bring bil LEs into/out of car.  Gait with RW x 50' including turns, slowly, supervision.  Stairs management NT due to time constraints.  At end of session, pt seated in recliner with foot stool under feet, seat belt alarm set and needs at hand. Precautions/Restrictions- falls; previous R CVA  Vision/Perception  Praxis Praxis: Impaired Praxis-Other Comments: old L hemi  Cognition Overall Cognitive Status: Within Functional Limits for tasks assessed Arousal/Alertness: Awake/alert Orientation Level: Oriented X4 Sensation Sensation Light Touch: Appears Intact Proprioception: Not tested Coordination Gross Motor Movements are Fluid and Coordinated: No Fine Motor Movements are Fluid and Coordinated: No Coordination and Movement Description: limited on R by pain but improved from eval, limited on L by weakness from old CVA Heel Shin Test: NT Motor  Motor Motor: Abnormal postural alignment and control;Motor apraxia Motor - Discharge Observations: limited at times weight shifting d/t R pain but significantly improved  Mobility Bed Mobility Bed Mobility: Rolling Right;Rolling Left;Supine to Sit;Sit to Supine Rolling Left: Minimal Assistance - Patient > 75% Supine to Sit: Minimal Assistance - Patient > 75% Sit to Supine: Minimal Assistance - Patient > 75% Transfers Transfers: Sit to Stand;Stand to Sit;Stand Pivot Transfers Sit to Stand: Supervision/Verbal cueing Stand to Sit: Supervision/Verbal cueing Stand Pivot Transfers: Supervision/Verbal cueing Stand Pivot Transfer Details: Verbal cues for technique;Verbal cues for safe use of DME/AE;Verbal cues for precautions/safety Transfer (Assistive device): Rolling walker Locomotion  Gait Ambulation: Yes Gait Assistance: Supervision/Verbal cueing Gait Distance (Feet): 50 Feet Assistive device: Rolling walker Gait Assistance Details: Verbal cues for safe use of  DME/AE Gait Gait: Yes Gait Pattern: Impaired Gait Pattern: Step-to pattern;Decreased stride length;Decreased hip/knee flexion - left;Poor foot clearance - left;Decreased trunk rotation Stairs / Additional Locomotion Stairs: Yes (tx 1/21) Stairs Assistance: Minimal Assistance - Patient > 75%;Moderate Assistance - Patient 50 - 74% Stair Management Technique: One rail Left;Sideways Number of Stairs: 4 Height of Stairs: 6 Wheelchair Mobility Wheelchair Mobility: Yes Wheelchair Assistance: Chartered loss adjuster: Both upper extremities Wheelchair Parts Management: Needs assistance  Trunk/Postural Assessment  Cervical Assessment Cervical Assessment: Exceptions to [facility] Thoracic Assessment Thoracic Assessment: Exceptions to [facility] Lumbar Assessment Lumbar Assessment: Exceptions to [facility] Postural Control Postural Control: Within Functional Limits  Balance Balance Balance Assessed: Yes Static Sitting Balance Static Sitting - Balance Support: Feet supported Static Sitting - Level of Assistance: 6: Modified independent (Device/Increase time) Dynamic Sitting Balance Dynamic Sitting - Balance Support: Feet supported;Left upper extremity supported Dynamic Sitting - Level of Assistance: 5: Stand by assistance Dynamic Sitting - Balance Activities: Lateral lean/weight shifting;Forward lean/weight shifting;Reaching for objects Static Standing Balance Static Standing - Balance Support: During functional activity;Bilateral upper extremity supported Static Standing - Level of Assistance: 5: Stand by assistance Dynamic Standing Balance Dynamic Standing - Balance Support: During functional activity;Left upper extremity supported Dynamic Standing - Level of Assistance: 5: Stand by assistance Dynamic Standing - Balance Activities: Forward lean/weight shifting;Lateral lean/weight shifting;Reaching for objects Extremity Assessment      RLE Assessment RLE Assessment: Exceptions to  [facility] Active Range of Motion (AROM) Comments: AAROM WFL, but painful in hip General Strength Comments: Hip flexion NT due to pain, knee extension at least 3+/5, ankle DF WFL LLE Assessment LLE Assessment: Within Functional Limits    Sevastian Witczak 11/08/2020, 5:15 PM

## 2020-11-09 DIAGNOSIS — R350 Frequency of micturition: Secondary | ICD-10-CM

## 2020-11-09 DIAGNOSIS — G8918 Other acute postprocedural pain: Secondary | ICD-10-CM

## 2020-11-09 LAB — GLUCOSE, CAPILLARY: Glucose-Capillary: 108 mg/dL — ABNORMAL HIGH (ref 70–99)

## 2020-11-09 MED ORDER — OXYCODONE-ACETAMINOPHEN 5-325 MG PO TABS: 1.0000 | ORAL_TABLET | Freq: Three times a day (TID) | ORAL | 0 refills | Status: DC | PRN

## 2020-11-09 MED ORDER — PANTOPRAZOLE SODIUM 40 MG PO TBEC: 40.0000 mg | DELAYED_RELEASE_TABLET | Freq: Every day | ORAL | 0 refills | Status: DC

## 2020-11-09 MED ORDER — PRAVASTATIN SODIUM 20 MG PO TABS: 20.0000 mg | ORAL_TABLET | Freq: Every day | ORAL | 0 refills | Status: DC

## 2020-11-09 MED ORDER — METFORMIN HCL 500 MG PO TABS: 250.0000 mg | ORAL_TABLET | Freq: Two times a day (BID) | ORAL | 0 refills | Status: DC

## 2020-11-09 MED ORDER — GABAPENTIN 250 MG/5ML PO SOLN: 100.0000 mg | Freq: Every evening | ORAL | 12 refills | Status: DC | PRN

## 2020-11-09 MED ORDER — METOPROLOL TARTRATE 25 MG PO TABS: 25.0000 mg | ORAL_TABLET | Freq: Two times a day (BID) | ORAL | 0 refills | Status: DC

## 2020-11-09 MED ORDER — TIZANIDINE HCL 2 MG PO TABS: 2.0000 mg | ORAL_TABLET | Freq: Three times a day (TID) | ORAL | 0 refills | Status: DC | PRN

## 2020-11-09 MED ORDER — APIXABAN 5 MG PO TABS: 5.0000 mg | ORAL_TABLET | Freq: Two times a day (BID) | ORAL | 0 refills | Status: DC

## 2020-11-09 MED ORDER — LIDOCAINE 5 % EX PTCH: 2.0000 | MEDICATED_PATCH | CUTANEOUS | 0 refills | Status: DC

## 2020-11-09 MED ORDER — ACETAMINOPHEN 325 MG PO TABS: 650.0000 mg | ORAL_TABLET | Freq: Four times a day (QID) | ORAL | Status: DC | PRN

## 2020-11-09 MED ORDER — MIRABEGRON ER 50 MG PO TB24: 50.0000 mg | ORAL_TABLET | Freq: Every day | ORAL | 0 refills | Status: DC

## 2020-11-09 MED ORDER — VITAMIN D 50 MCG (2000 UT) PO TABS: 2000.0000 [IU] | ORAL_TABLET | Freq: Every day | ORAL | 0 refills | Status: AC

## 2020-11-09 MED ORDER — NITROGLYCERIN 0.4 MG SL SUBL
0.4000 mg | SUBLINGUAL_TABLET | SUBLINGUAL | 12 refills | Status: AC | PRN
Start: 2020-11-09 — End: ?

## 2020-11-09 MED ORDER — TRAMADOL HCL 50 MG PO TABS: 50.0000 mg | ORAL_TABLET | Freq: Four times a day (QID) | ORAL | 0 refills | Status: DC | PRN

## 2020-11-09 NOTE — Progress Notes (Signed)
Patient ID: [name], female   DOB: [birth year], 79 y.o.   MRN: [id number] Discharge to home accompanied by daughter. Instructions given to patient and daughter per Deatra Ina PA and packet. Belongings packet up and pt down with wheelchair. Pamelia Hoit

## 2020-11-09 NOTE — Progress Notes (Signed)
[place] PHYSICAL MEDICINE & REHABILITATION PROGRESS NOTE   Subjective/Complaints: Patient seen sitting up in bed this morning.  No reported issues overnight.  ROS: limited due to cognition/communication  Objective:   No results found. No results for input(s): WBC, HGB, HCT, PLT in the last 72 hours. No results for input(s): NA, K, CL, CO2, GLUCOSE, BUN, CREATININE, CALCIUM in the last 72 hours.  Intake/Output Summary (Last 24 hours) at 11/09/2020 0856 Last data filed at 11/08/2020 1828 Gross per 24 hour  Intake 360 ml  Output -  Net 360 ml     Physical Exam: Vital Signs Blood pressure 112/66, pulse 62, temperature 98.2 F (36.8 C), resp. rate 14, height 4\' 10"  (1.473 m), weight 64.6 kg, SpO2 98 %. Constitutional: No distress . Vital signs reviewed. HENT: Normocephalic.  Atraumatic. Eyes: EOMI. No discharge. Cardiovascular: No JVD.  Irregularly irregular. Respiratory: Normal effort.  No stridor.  Bilateral clear to auscultation. GI: Non-distended.  BS +. Skin: Warm and dry.  Bruising Psych: Normal mood.  Appears slightly impulsive/restless. Musc: Right hip with mild edema and tenderness Neuro: Alert Motor: RLE- HF >4-/5 (limited due to?  Pain and participation) KE >/ 4-/5, DF and PF at least 4/5 LLE- HF >/ 4/5, KE 4/5, DF and PF 4/5   Assessment/Plan: 1. Functional deficits which require 3+ hours per day of interdisciplinary therapy in a comprehensive inpatient rehab setting.  Physiatrist is providing close team supervision and 24 hour management of active medical problems listed below.  Physiatrist and rehab team continue to assess barriers to discharge/monitor patient progress toward functional and medical goals  Care Tool:  Bathing    Body parts bathed by patient: Right arm,Left arm,Chest,Abdomen,Front perineal area,Right upper leg,Left upper leg,Face,Right lower leg,Left lower leg,Buttocks   Body parts bathed by helper: Buttocks     Bathing assist Assist  Level: Contact Guard/Touching assist     Upper Body Dressing/Undressing Upper body dressing   What is the patient wearing?: Pull over shirt    Upper body assist Assist Level: Minimal Assistance - Patient > 75%    Lower Body Dressing/Undressing Lower body dressing      What is the patient wearing?: Pants,Incontinence brief     Lower body assist Assist for lower body dressing: Minimal Assistance - Patient > 75%     Toileting Toileting    Toileting assist Assist for toileting: Contact Guard/Touching assist     Transfers Chair/bed transfer  Transfers assist     Chair/bed transfer assist level: Supervision/Verbal cueing     Locomotion Ambulation   Ambulation assist      Assist level: Supervision/Verbal cueing Assistive device: Walker-rolling Max distance: 50   Walk 10 feet activity   Assist  Walk 10 feet activity did not occur: Safety/medical concerns  Assist level: Supervision/Verbal cueing Assistive device: Walker-rolling   Walk 50 feet activity   Assist Walk 50 feet with 2 turns activity did not occur: Safety/medical concerns  Assist level: Supervision/Verbal cueing Assistive device: Walker-rolling    Walk 150 feet activity   Assist Walk 150 feet activity did not occur: Safety/medical concerns (R pelvic pain)    Assistive device: Walker-rolling    Walk 10 feet on uneven surface  activity   Assist Walk 10 feet on uneven surfaces activity did not occur: Safety/medical concerns (previous L hemi; current R pelvic fx)         Wheelchair     Assist Will patient use wheelchair at discharge?: Yes Type of Wheelchair: Manual  Wheelchair assist level: Supervision/Verbal cueing Max wheelchair distance: 50    Wheelchair 50 feet with 2 turns activity    Assist    Wheelchair 50 feet with 2 turns activity did not occur: Safety/medical concerns   Assist Level: Moderate Assistance - Patient 50 - 74%   Wheelchair 150 feet  activity     Assist  Wheelchair 150 feet activity did not occur: Safety/medical concerns (fatigue)       Blood pressure 112/66, pulse 62, temperature 98.2 F (36.8 C), resp. rate 14, height 4\' 10"  (1.473 m), weight 64.6 kg, SpO2 98 %.  Medical Problem List and Plan: 1.  Decreased functional mobility secondary to right superior and inferior pubic rami fracture nondisplaced after a fall.  Weightbearing as tolerated.  Follow-up Dr.  DC today  Patient follow-up with attending MD in 1 month post discharge for hospital follow-up  2.  Antithrombotics: -DVT/anticoagulation: Continue chronic Eliquis             -antiplatelet therapy: N/A 3. Pain Management: Neurontin 100 mg nightly as needed,   Zanaflex   Tramadol 50 mg q6 hours prn since pt says Percocet wears off too fast.   Lidoderm patches 2 patches 12 hrs on;12 hrs off- 8am to 8pm  Percocet to 6H PRN 4. Mood: Provide emotional support             Antipsychotic agents: N/A 5. Neuropsych: This patient is? capable of making decisions on her own behalf. 6. Skin/Wound Care: Routine skin checks 7. Fluids/Electrolytes/Nutrition: Routine in and outs with follow-up chemistries 8.  Chronic atrial fibrillation.  Continue Eliquis.  Cardiac rate controlled.    Controlled with lopressor to 25mg  bid  9.  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  Received CIR 10/19/2016 to 11/01/2016.  Patient used a rolling walker prior to admission. 10.  Diabetes mellitus.  Hemoglobin A1c 6.4.    Decrease metformin to 250 mg BID with meals  Relatively controlled on 1/24 11.  Hyperlipidemia.  Pravachol 12.  GERD.  Protonix 13.  Constipation.    Continue meds   Improved 14. Urinary frequency-  Question of behavioral  Myrbetriq 50 daily 15. Chest pain-resolved  Seen by cards, stress test performed, signed off  16. Poor appetite-   Improved  LOS: 20  days A FACE TO FACE EVALUATION WAS PERFORMED  Abou Sterkel 11/03/2016 11/09/2020, 8:56 AM

## 2020-11-12 ENCOUNTER — Other Ambulatory Visit: Payer: Self-pay

## 2020-11-12 ENCOUNTER — Ambulatory Visit (INDEPENDENT_AMBULATORY_CARE_PROVIDER_SITE_OTHER): Payer: Medicare Other | Admitting: Physician Assistant

## 2020-11-12 VITALS — BP 100/66 | HR 68 | Wt 140.0 lb

## 2020-11-12 DIAGNOSIS — I10 Essential (primary) hypertension: Secondary | ICD-10-CM | POA: Diagnosis not present

## 2020-11-12 DIAGNOSIS — I482 Chronic atrial fibrillation, unspecified: Secondary | ICD-10-CM

## 2020-11-12 DIAGNOSIS — E785 Hyperlipidemia, unspecified: Secondary | ICD-10-CM | POA: Diagnosis not present

## 2020-11-12 DIAGNOSIS — E119 Type 2 diabetes mellitus without complications: Secondary | ICD-10-CM

## 2020-11-12 DIAGNOSIS — R079 Chest pain, unspecified: Secondary | ICD-10-CM | POA: Diagnosis not present

## 2020-11-12 DIAGNOSIS — J9859 Other diseases of mediastinum, not elsewhere classified: Secondary | ICD-10-CM

## 2020-11-12 MED ORDER — METOPROLOL TARTRATE 25 MG PO TABS: 12.5000 mg | ORAL_TABLET | Freq: Two times a day (BID) | ORAL | 6 refills | Status: DC

## 2020-11-12 NOTE — Progress Notes (Signed)
Cardiology Office Note:    Date:  11/14/2020   ID:  Vickery, DOB [birth year], MRN [id number]  PCP:  [name], MD  [facility] Cardiologist:  Olga Millers, MD  [facility] Electrophysiologist:  None   Referring MD: [name], MD   Chief Complaint  Patient presents with  . Follow-up    Seen for Dr. Jens Som    History of Present Illness:    [name] is a 80 y.o. 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  Recently, he presented to the hospital on 10/17/2020 after sustaining a mechanical fall that resulted in right rami pubic fracture.  Orthopedic service recommended conservative management with pain control and weightbearing as tolerated.  During the hospitalization, she had several episodes of chest pain.  Serial troponin was negative.  On further questioning, she mentioned her chest pain has been going on for several months prior to arrival.  Myoview performed on 10/27/2020 showed a small defect of mild severity present in the apical anterior location with mild perfusion defect on resting image at the anterior apical segment.  Overall low risk study with no ischemia, the defect could be either breast attenuation or small prior infarct with partial reversibility.  Imaging was personally reviewed by Dr. Jens Som, he recommended medical management without further ischemic evaluation.  Patient presents today for follow-up.  She continues to have intermittent chest discomfort.  Symptom occurs about 2-3 times a week and has not shown any increasing frequency or duration.  She is accompanied by her daughter.  During the inpatient rehab, her metoprolol was increased, however this created increased dizziness and the low blood pressure.  On the 25 mg twice a day of metoprolol,  her blood pressure is 100/66 today.  I will decrease her metoprolol to 12.5 mg twice a day.  Given the reassuring Myoview, I recommended continue observation and call cardiology service if she has increased duration or frequency of chest discomfort.  She can continue to use sublingual nitroglycerin for her chest pain.  Otherwise she can follow-up with Dr. Jens Som in 3 months.  Interview was conducted with the help of the enemies translator Mr. Tona Sensing   Past Medical History:  Diagnosis Date  . Atrial fibrillation (HCC)   . Chronic back pain    "mid back down into lower back" (08/25/2014)  . Frequent falls   . GERD (gastroesophageal reflux disease)   . Hypertriglyceridemia   . OSA on CPAP   . Osteoarthritis    "knees, hands, back" (08/25/2014)  . Pneumonia ~ 2000 X 1  . Subdural hematoma (HCC) july 2015   S/P fall while on Coumadin  . T12 compression fracture (HCC) 2012  . Type II diabetes mellitus (HCC)     Past Surgical History:  Procedure Laterality Date  . APPENDECTOMY  2012  . CATARACT EXTRACTION W/ INTRAOCULAR LENS  IMPLANT, BILATERAL Bilateral 2000's  . IR GENERIC HISTORICAL  10/15/2016   IR PERCUTANEOUS ART THROMBECTOMY/INFUSION INTRACRANIAL INC DIAG ANGIO 10/15/2016 [name], MD [facility]INTER[facility] RAD  . IR GENERIC HISTORICAL  12/13/2016   IR RADIOLOGIST EVAL & MGMT 12/13/2016 [facility]  . RADIOLOGY WITH ANESTHESIA N/A 10/15/2016   Procedure: RADIOLOGY WITH ANESTHESIA;  Surgeon: [name], MD;  Location: [facility] OR;  Service: Radiology;  Laterality: N/A;  .  TOTAL ABDOMINAL HYSTERECTOMY  1990    Current Medications: Current Meds  Medication Sig  . acetaminophen (TYLENOL) 325 MG tablet Take 2 tablets (650 mg total) by mouth every 6 (six) hours as needed for mild pain, headache or fever.  [name] apixaban (ELIQUIS) 5 MG TABS tablet Take 1 tablet (5 mg total) by mouth 2 (two) times daily.  . cetirizine (ZYRTEC) 10 MG tablet Take 10 mg daily as needed by mouth (seasonal  allergies).  . Cholecalciferol (VITAMIN D) 50 MCG (2000 UT) tablet Take 1 tablet (2,000 Units total) by mouth daily.  . fluticasone (FLONASE) 50 MCG/ACT nasal spray Place 1 spray daily as needed into both nostrils (seasonal allergies).   . gabapentin (NEURONTIN) 250 MG/5ML solution Take 2 mLs (100 mg total) by mouth at bedtime as needed (neuropathy).  . lidocaine (LIDODERM) 5 % Place 2 patches onto the skin daily. Remove & Discard patch within 12 hours or as directed by MD  . metFORMIN (GLUCOPHAGE) 500 MG tablet Take 0.5 tablets (250 mg total) by mouth 2 (two) times daily with a meal.  . mirabegron ER (MYRBETRIQ) 50 MG TB24 tablet Take 1 tablet (50 mg total) by mouth at bedtime.  . nitroGLYCERIN (NITROSTAT) 0.4 MG SL tablet Place 1 tablet (0.4 mg total) under the tongue every 5 (five) minutes as needed for chest pain.  [name] oxyCODONE-acetaminophen (PERCOCET/ROXICET) 5-325 MG tablet Take 1 tablet by mouth every 8 (eight) hours as needed for severe pain.  . pantoprazole (PROTONIX) 40 MG tablet Take 1 tablet (40 mg total) by mouth daily.  . polyethylene glycol (MIRALAX / GLYCOLAX) 17 g packet Take 17 g by mouth daily as needed for mild constipation.  . pravastatin (PRAVACHOL) 20 MG tablet Take 1 tablet (20 mg total) by mouth at bedtime.  . senna-docusate (SENOKOT-S) 8.6-50 MG tablet Take 2 tablets 2 (two) times daily as needed by mouth (while taking narcotic pain medication).  [name] tiZANidine (ZANAFLEX) 2 MG tablet Take 1 tablet (2 mg total) by mouth every 8 (eight) hours as needed for muscle spasms.  . traMADol (ULTRAM) 50 MG tablet Take 1 tablet (50 mg total) by mouth every 6 (six) hours as needed for moderate pain.  . [DISCONTINUED] metoprolol tartrate (LOPRESSOR) 25 MG tablet Take 1 tablet (25 mg total) by mouth 2 (two) times daily.     Allergies:   Bactrim [sulfamethoxazole-trimethoprim]   Social History   Socioeconomic History  . Marital status: Widowed    Spouse name: Not on file  . Number of  children: 2  . Years of education: 2  . Highest education level: Not on file  Occupational History    Comment: retired  Tobacco Use  . Smoking status: Passive Smoke Exposure - Never Smoker  . Smokeless tobacco: Never Used  Substance and Sexual Activity  . Alcohol use: No  . Drug use: No  . Sexual activity: Never  Other Topics Concern  . Not on file  Social History Narrative   Lives w/daughter and grandchildren   caffeine coffee, 1/2 cup daily   Social Determinants of Health   Financial Resource Strain: Not on file  Food Insecurity: Not on file  Transportation Needs: Not on file  Physical Activity: Not on file  Stress: Not on file  Social Connections: Not on file     Family History: The patient's family history is negative for Diabetes Mellitus II and Hypertension.  ROS:   Please see the history of present illness.     All other  systems reviewed and are negative.  EKGs/Labs/Other Studies Reviewed:    The following studies were reviewed today:  Echo 10/17/2016 LV EF: 65% -  70%   -------------------------------------------------------------------  Indications:   CVA 436.   -------------------------------------------------------------------  History:  PMH:  Atrial fibrillation. Risk factors: Diabetes  mellitus.   -------------------------------------------------------------------  Study Conclusions   - Left ventricle: The cavity size was normal. There was moderate  concentric hypertrophy. Systolic function was vigorous. The  estimated ejection fraction was in the range of 65% to 70%. Wall  motion was normal; there were no regional wall motion  abnormalities.  - Aortic valve: Valve mobility was trivially restricted.  Transvalvular velocity was increased. There was mild stenosis.  Valve area (VTI): 0.86 cm^2. Valve area (Vmax): 1.05 cm^2. Valve  area (Vmean): 0.89 cm^2.  - Mitral valve: Moderately calcified annulus. There was mild   regurgitation.  - Left atrium: The atrium was moderately dilated.  - Right atrium: The atrium was mildly dilated.  - Pulmonary arteries: Systolic pressure was mildly to moderately  increased. PA peak pressure: 41 mm Hg (S).   Impressions:   - No cardiac source of embolism was identified, but cannot be ruled  out on the basis of this examination.   Recommendations: Consider transesophageal echocardiography if  clinically indicated.    Myoview 10/27/2020 IMPRESSION: 1. No reversible ischemia or infarction.  2. Normal left ventricular wall motion.  3. Left ventricular ejection fraction 79%  4. Non invasive risk stratification*: Low    EKG:  EKG is not ordered today.    Recent Labs: 10/20/2020: Magnesium 2.1 10/21/2020: ALT 12 10/26/2020: BUN 13; Creatinine, Ser 0.66; Hemoglobin 10.3; Platelets 403; Potassium 3.8; Sodium 138  Recent Lipid Panel    Component Value Date/Time   CHOL 128 10/16/2016 0356   TRIG 198 (H) 10/16/2016 0356   HDL 29 (L) 10/16/2016 0356   CHOLHDL 4.4 10/16/2016 0356   VLDL 40 10/16/2016 0356   LDLCALC 59 10/16/2016 0356     Risk Assessment/Calculations:    CHA2DS2-VASc Score = 7  This indicates a 11.2% annual risk of stroke. The patient's score is based upon: CHF History: No HTN History: Yes Diabetes History: Yes Stroke History: Yes Vascular Disease History: No Age Score: 2 Gender Score: 1      Physical Exam:    VS:  BP 100/66 (BP Location: Left Arm, Patient Position: Sitting, Cuff Size: Normal)   Pulse 68   Wt 140 lb (63.5 kg)   SpO2 98%   BMI 29.26 kg/m     Wt Readings from Last 3 Encounters:  11/12/20 140 lb (63.5 kg)  10/20/20 142 lb 6.7 oz (64.6 kg)  10/19/20 150 lb 2.1 oz (68.1 kg)     GEN:  Well nourished, well developed in no acute distress HEENT: Normal NECK: No JVD; No carotid bruits LYMPHATICS: No lymphadenopathy CARDIAC: Irregularly irregular, no murmurs, rubs, gallops RESPIRATORY:  Clear to  auscultation without rales, wheezing or rhonchi  ABDOMEN: Soft, non-tender, non-distended MUSCULOSKELETAL:  No edema; No deformity  SKIN: Warm and dry NEUROLOGIC:  Alert and oriented x 3 PSYCHIATRIC:  Normal affect   ASSESSMENT:    1. Chest pain of uncertain etiology   2. Chronic atrial fibrillation (HCC)   3. Essential hypertension   4. Hyperlipidemia LDL goal <70   5. Controlled type 2 diabetes mellitus without complication, without long-term current use of insulin (HCC)   6. Mediastinal mass     PLAN:    In order of problems  listed above:  1. Chest pain: Recent Myoview was negative.  Chest pain does not occur with exertion.  We will continue observation, family has been informed to contact cardiology service if the frequency or duration of her chest pain worsens over time.  2. Chronic atrial fibrillation: Rate controlled on metoprolol.  Blood pressure is borderline low on 25 mg dosage, will decrease metoprolol to 12.5 mg twice a day.  Continue Eliquis  3. Hypertension: Blood pressure low on 25 mg twice a day.  She has been having some dizziness and fatigue.  Blood pressure borderline.  Will decrease metoprolol to 12.5 mg twice a day.  4. Hyperlipidemia: On pravastatin  5. DM2: Managed by primary care provider  6. History of mediastinal mass: Follow[facility] by [facility]         Medication Adjustments/Labs and Tests Ordered: Current medicines are reviewed at length with the patient today.  Concerns regarding medicines are outlined above.  No orders of the defined types were placed in this encounter.  Meds ordered this encounter  Medications  . metoprolol tartrate (LOPRESSOR) 25 MG tablet    Sig: Take 0.5 tablets (12.5 mg total) by mouth 2 (two) times daily.    Dispense:  30 tablet    Refill:  6    Patient Instructions  Medication Instructions:  DECREASE METOPROLOL 12.5MG (1/2 TAB) TWICE DAILY *If you need a refill on your cardiac medications before your next  appointment, please call your pharmacy*  Lab Work:   Testing/Procedures:  NONE    NONE  Special Instructions CALL OUR OFFICE WITH ANY CONCERNS WITH CHEST PAIN, ETC...  Follow-Up: Your next appointment:  3 month(s) In Person with Olga Millers, MD OR IF UNAVAILABLE Idora Brosious, PA-C   At [facility], you and your health needs are our priority.  As part of our continuing mission to provide you with exceptional heart care, we have created designated Provider Care Teams.  These Care Teams include your primary Cardiologist (physician) and Advanced Practice Providers (APPs -  Physician Assistants and Nurse Practitioners) who all work together to provide you with the care you need, when you need it.     Ramond Dial, [place]  11/14/2020 10:38 AM    [facility]

## 2020-11-12 NOTE — Patient Instructions (Signed)
Medication Instructions:  DECREASE METOPROLOL 12.5MG (1/2 TAB) TWICE DAILY *If you need a refill on your cardiac medications before your next appointment, please call your pharmacy*  Lab Work:   Testing/Procedures:  NONE    NONE  Special Instructions CALL OUR OFFICE WITH ANY CONCERNS WITH CHEST PAIN, ETC...  Follow-Up: Your next appointment:  3 month(s) In Person with Olga Millers, MD OR IF UNAVAILABLE HAO MENG, PA-C   At [facility], you and your health needs are our priority.  As part of our continuing mission to provide you with exceptional heart care, we have created designated Provider Care Teams.  These Care Teams include your primary Cardiologist (physician) and Advanced Practice Providers (APPs -  Physician Assistants and Nurse Practitioners) who all work together to provide you with the care you need, when you need it.

## 2020-11-14 ENCOUNTER — Encounter: Payer: Self-pay | Admitting: Physician Assistant

## 2021-02-11 ENCOUNTER — Ambulatory Visit: Payer: Medicare Other | Admitting: Cardiology

## 2021-03-15 NOTE — Progress Notes (Deleted)
Cardiology Clinic Note   Patient Name: [name] Date of Encounter: 03/15/2021  Primary Care Provider:  [name], MD Primary Cardiologist:  Olga Millers, MD  Patient Profile    [facility] 80 year old female presents the clinic today for follow-up evaluation of her chronic atrial fibrillation and hypertension.  Past Medical History    Past Medical History:  Diagnosis Date  . Atrial fibrillation (HCC)   . Chronic back pain    "mid back down into lower back" (08/25/2014)  . Frequent falls   . GERD (gastroesophageal reflux disease)   . Hypertriglyceridemia   . OSA on CPAP   . Osteoarthritis    "knees, hands, back" (08/25/2014)  . Pneumonia ~ 2000 X 1  . Subdural hematoma (HCC) july 2015   S/P fall while on Coumadin  . T12 compression fracture (HCC) 2012  . Type II diabetes mellitus (HCC)    Past Surgical History:  Procedure Laterality Date  . APPENDECTOMY  2012  . CATARACT EXTRACTION W/ INTRAOCULAR LENS  IMPLANT, BILATERAL Bilateral 2000's  . IR GENERIC HISTORICAL  10/15/2016   IR PERCUTANEOUS ART THROMBECTOMY/INFUSION INTRACRANIAL INC DIAG ANGIO 10/15/2016 [name], MD [facility]  . IR GENERIC HISTORICAL  12/13/2016   IR RADIOLOGIST EVAL & MGMT 12/13/2016 [facility]  . RADIOLOGY WITH ANESTHESIA N/A 10/15/2016   Procedure: RADIOLOGY WITH ANESTHESIA;  Surgeon: [name], MD;  Location: [facility] OR;  Service: Radiology;  Laterality: N/A;  . TOTAL ABDOMINAL HYSTERECTOMY  1990    Allergies  Allergies  Allergen Reactions  . Bactrim [Sulfamethoxazole-Trimethoprim] Cough    And flushing of face and conjunctivitis.     History of Present Illness    [name] has a PMH of chronic atrial fibrillation, acute embolic CVA, hypotension, OSA on CPAP, acute bronchitis, diabetes mellitus type 2, weakness, acute blood loss anemia, HLD, and precordial pain.  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  She presented to the hospital 10/17/2020 after sustaining a mechanical fall that resulted in a pubic fracture.  Orthopedics recommended conservative management with pain control and weightbearing as tolerated.  During hospitalization she was noted to have several episodes of chest discomfort.  Her troponins were negative.  With further evaluation she mentioned chest pain that has been ongoing for several months.  She underwent nuclear stress test 10/27/2020 that showed small defect of mild severity in the anterior apical location with mild perfusion defect at the anterior apical segment.  It was considered a low risk study with no ischemia.  Medical management was recommended with no further ischemic evaluation.  She was seen by Azalee Course, PA-C on 11/12/2020.  During that time she continued to have intermittent periods of chest discomfort.  She reported that her symptoms occurred 2-3 times per week and she had not noticed an increase in the frequency or duration of her discomfort.  She was accompanied by her daughter.  During her inpatient rehab her metoprolol was increased however the increase cause dizziness.  On 25 mg twice daily of metoprolol her blood pressure was 100/66.  Her metoprolol was further decreased to 12.5 mg twice daily.  Observation was recommended.  A translator was used for the visit.  She presents the clinic today for follow-up evaluation states***  *** denies chest pain, shortness of breath, lower extremity edema, fatigue, palpitations, melena, hematuria, hemoptysis, diaphoresis, weakness, presyncope, syncope, orthopnea, and PND.   Home Medications    Prior to  Admission medications   Medication Sig Start Date End Date Taking? Authorizing Provider  acetaminophen (TYLENOL) 325 MG tablet Take 2 tablets (650 mg total) by mouth every 6 (six) hours as needed for mild pain, headache or fever. 11/09/20   Angiulli, Mcarthur Rossetti, PA-C  apixaban (ELIQUIS) 5 MG TABS tablet Take 1 tablet (5 mg  total) by mouth 2 (two) times daily. 11/09/20   Angiulli, Mcarthur Rossetti, PA-C  cetirizine (ZYRTEC) 10 MG tablet Take 10 mg daily as needed by mouth (seasonal allergies).    [provider]  Cholecalciferol (VITAMIN D) 50 MCG (2000 UT) tablet Take 1 tablet (2,000 Units total) by mouth daily. 11/09/20   Angiulli, Mcarthur Rossetti, PA-C  fluticasone (FLONASE) 50 MCG/ACT nasal spray Place 1 spray daily as needed into both nostrils (seasonal allergies).  02/18/14   [provider]  gabapentin (NEURONTIN) 250 MG/5ML solution Take 2 mLs (100 mg total) by mouth at bedtime as needed (neuropathy). 11/09/20   Angiulli, Mcarthur Rossetti, PA-C  lidocaine (LIDODERM) 5 % Place 2 patches onto the skin daily. Remove & Discard patch within 12 hours or as directed by MD 11/09/20   Angiulli, Mcarthur Rossetti, PA-C  metFORMIN (GLUCOPHAGE) 500 MG tablet Take 0.5 tablets (250 mg total) by mouth 2 (two) times daily with a meal. 11/09/20   Angiulli, Mcarthur Rossetti, PA-C  metoprolol tartrate (LOPRESSOR) 25 MG tablet Take 0.5 tablets (12.5 mg total) by mouth 2 (two) times daily. 11/12/20   Azalee Course, PA  mirabegron ER (MYRBETRIQ) 50 MG TB24 tablet Take 1 tablet (50 mg total) by mouth at bedtime. 11/09/20   Angiulli, Mcarthur Rossetti, PA-C  nitroGLYCERIN (NITROSTAT) 0.4 MG SL tablet Place 1 tablet (0.4 mg total) under the tongue every 5 (five) minutes as needed for chest pain. 11/09/20   Angiulli, Mcarthur Rossetti, PA-C  oxyCODONE-acetaminophen (PERCOCET/ROXICET) 5-325 MG tablet Take 1 tablet by mouth every 8 (eight) hours as needed for severe pain. 11/09/20   Angiulli, Mcarthur Rossetti, PA-C  pantoprazole (PROTONIX) 40 MG tablet Take 1 tablet (40 mg total) by mouth daily. 11/09/20   Angiulli, Mcarthur Rossetti, PA-C  polyethylene glycol (MIRALAX / GLYCOLAX) 17 g packet Take 17 g by mouth daily as needed for mild constipation. 10/20/20   Glade Lloyd, MD  pravastatin (PRAVACHOL) 20 MG tablet Take 1 tablet (20 mg total) by mouth at bedtime. 11/09/20   Angiulli, Mcarthur Rossetti, PA-C  senna-docusate  (SENOKOT-S) 8.6-50 MG tablet Take 2 tablets 2 (two) times daily as needed by mouth (while taking narcotic pain medication).    [provider]  tiZANidine (ZANAFLEX) 2 MG tablet Take 1 tablet (2 mg total) by mouth every 8 (eight) hours as needed for muscle spasms. 11/09/20   Angiulli, Mcarthur Rossetti, PA-C  traMADol (ULTRAM) 50 MG tablet Take 1 tablet (50 mg total) by mouth every 6 (six) hours as needed for moderate pain. 11/09/20   Angiulli, Mcarthur Rossetti, PA-C    Family History    Family History  Problem Relation Age of Onset  . Diabetes Mellitus II Neg Hx   . Hypertension Neg Hx    She indicated that her mother is deceased. She indicated that her father is deceased. She indicated that her sister is deceased. She indicated that her brother is alive. She indicated that her daughter is alive. She indicated that the status of her neg hx is unknown.  Social History    Social History   Socioeconomic History  . Marital status: Widowed    Spouse name: Not  on file  . Number of children: 2  . Years of education: 2  . Highest education level: Not on file  Occupational History    Comment: retired  Tobacco Use  . Smoking status: Passive Smoke Exposure - Never Smoker  . Smokeless tobacco: Never Used  Substance and Sexual Activity  . Alcohol use: No  . Drug use: No  . Sexual activity: Never  Other Topics Concern  . Not on file  Social History Narrative   Lives w/daughter and grandchildren   caffeine coffee, 1/2 cup daily   Social Determinants of Health   Financial Resource Strain: Not on file  Food Insecurity: Not on file  Transportation Needs: Not on file  Physical Activity: Not on file  Stress: Not on file  Social Connections: Not on file  Intimate Partner Violence: Not on file     Review of Systems    General:  No chills, fever, night sweats or weight changes.  Cardiovascular:  No chest pain, dyspnea on exertion, edema, orthopnea, palpitations, paroxysmal nocturnal  dyspnea. Dermatological: No rash, lesions/masses Respiratory: No cough, dyspnea Urologic: No hematuria, dysuria Abdominal:   No nausea, vomiting, diarrhea, bright red blood per rectum, melena, or hematemesis Neurologic:  No visual changes, wkns, changes in mental status. All other systems reviewed and are otherwise negative except as noted above.  Physical Exam    VS:  There were no vitals taken for this visit. , BMI There is no height or weight on file to calculate BMI. GEN: Well nourished, well developed, in no acute distress. HEENT: normal. Neck: Supple, no JVD, carotid bruits, or masses. Cardiac: RRR, no murmurs, rubs, or gallops. No clubbing, cyanosis, edema.  Radials/DP/PT 2+ and equal bilaterally.  Respiratory:  Respirations regular and unlabored, clear to auscultation bilaterally. GI: Soft, nontender, nondistended, BS + x 4. MS: no deformity or atrophy. Skin: warm and dry, no rash. Neuro:  Strength and sensation are intact. Psych: Normal affect.  Accessory Clinical Findings    Recent Labs: 10/20/2020: Magnesium 2.1 10/21/2020: ALT 12 10/26/2020: BUN 13; Creatinine, Ser 0.66; Hemoglobin 10.3; Platelets 403; Potassium 3.8; Sodium 138   Recent Lipid Panel    Component Value Date/Time   CHOL 128 10/16/2016 0356   TRIG 198 (H) 10/16/2016 0356   HDL 29 (L) 10/16/2016 0356   CHOLHDL 4.4 10/16/2016 0356   VLDL 40 10/16/2016 0356   LDLCALC 59 10/16/2016 0356    ECG personally reviewed by me today- *** - No acute changes  Echocardiogram 10/17/2016 Study Conclusions   - Left ventricle: The cavity size was normal. There was moderate  concentric hypertrophy. Systolic function was vigorous. The  estimated ejection fraction was in the range of 65% to 70%. Wall  motion was normal; there were no regional wall motion  abnormalities.  - Aortic valve: Valve mobility was trivially restricted.  Transvalvular velocity was increased. There was mild stenosis.  Valve area  (VTI): 0.86 cm^2. Valve area (Vmax): 1.05 cm^2. Valve  area (Vmean): 0.89 cm^2.  - Mitral valve: Moderately calcified annulus. There was mild  regurgitation.  - Left atrium: The atrium was moderately dilated.  - Right atrium: The atrium was mildly dilated.  - Pulmonary arteries: Systolic pressure was mildly to moderately  increased. PA peak pressure: 41 mm Hg (S).   Impressions:   - No cardiac source of embolism was identified, but cannot be ruled  out on the basis of this examination.   Recommendations: Consider transesophageal echocardiography if  clinically indicated.  Myoview 10/27/2020 IMPRESSION: 1. No reversible ischemia or infarction.  2. Normal left ventricular wall motion.  3. Left ventricular ejection fraction 79%  4. Non invasive risk stratification*: Low  Assessment & Plan   1.  Chest pain- no chest pain today.  Had stress testing 10/27/2020 which was negative for ischemia and showed an EF of 79%. Continue*** Heart healthy low-sodium diet-salty 6 given Increase physical activity as tolerated  Atrial fibrillation-chronic.  Heart rate today***.  Cardiac unaware.  Previously did not tolerate metoprolol 25 mg twice daily Continue metoprolol 12 mg twice daily, Eliquis Heart healthy low-sodium diet-salty 6 given Increase physical activity as tolerated  Essential hypertension-BP today***.  Well-controlled at home. Continue metoprolol Heart healthy low-sodium diet-salty 6 given Increase physical activity as tolerated  Hyperlipidemia-LDL*** Continue pravastatin Heart healthy low-sodium high-fiber diet Increase physical activity as tolerated  Type 2 diabetes- blood glucose 151 on 10/26/2020 Continue metformin Follows with PCP  Disposition: Follow-up with Dr. Jens Somrenshaw in 4-6 months.  Thomasene RippleJesse M. Raymonde Hamblin NP-C    03/15/2021, 6:45 PM [facility] [address] Office [phone] Fax [phone]  Notice: This  dictation was prepared with Dragon dictation along with smaller phrase technology. Any transcriptional errors that result from this process are unintentional and may not be corrected upon review.  I spent***minutes examining this patient, reviewing medications, and using patient centered shared decision making involving her cardiac care.  Prior to her visit I spent greater than 20 minutes reviewing her past medical history,  medications, and prior cardiac tests.

## 2021-03-17 ENCOUNTER — Ambulatory Visit: Payer: Medicare Other | Admitting: General Practice

## 2021-04-20 NOTE — Progress Notes (Signed)
Cardiology Clinic Note   Patient Name: [name] Date of Encounter: 04/21/2021  Primary Care Provider:  Ermalinda Memosowlen, Hugh, MD Primary Cardiologist:  Olga MillersBrian Crenshaw, MD  Patient Profile    [name] 80 [facility] presents the clinic today for follow-up evaluation of her chronic atrial fibrillation and hypertension.  Past Medical History    Past Medical History:  Diagnosis Date   Atrial fibrillation (HCC)    Chronic back pain    "mid back down into lower back" (08/25/2014)   Frequent falls    GERD (gastroesophageal reflux disease)    Hypertriglyceridemia    OSA on CPAP    Osteoarthritis    "knees, hands, back" (08/25/2014)   Pneumonia ~ 2000 X 1   Subdural hematoma (HCC) july 2015   S/P fall while on Coumadin   T12 compression fracture (HCC) 2012   Type II diabetes mellitus (HCC)    Past Surgical History:  Procedure Laterality Date   APPENDECTOMY  2012   CATARACT EXTRACTION W/ INTRAOCULAR LENS  IMPLANT, BILATERAL Bilateral 2000's   IR GENERIC HISTORICAL  10/15/2016   IR PERCUTANEOUS ART THROMBECTOMY/INFUSION INTRACRANIAL INC DIAG ANGIO 10/15/2016 Julieanne CottonSanjeev Deveshwar, MD [facility]INTER[facility] RAD   IR GENERIC HISTORICAL  12/13/2016   IR RADIOLOGIST EVAL & MGMT 12/13/2016 [facility]   RADIOLOGY WITH ANESTHESIA N/A 10/15/2016   Procedure: RADIOLOGY WITH ANESTHESIA;  Surgeon: Julieanne CottonSanjeev Deveshwar, MD;  Location: [facility] OR;  Service: Radiology;  Laterality: N/A;   TOTAL ABDOMINAL HYSTERECTOMY  1990    Allergies  Allergies  Allergen Reactions   Bactrim [Sulfamethoxazole-Trimethoprim] Cough    And flushing of face and conjunctivitis.     History of Present Illness    [name] has a PMH of chronic atrial fibrillation, acute embolic CVA, hypotension, OSA on CPAP, acute bronchitis, diabetes mellitus type 2, weakness, acute blood loss anemia, HLD, and precordial pain.  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  She  presented to the hospital 10/17/2020 after sustaining a mechanical fall that resulted in a pubic fracture.  Orthopedics recommended conservative management with pain control and weightbearing as tolerated.  During hospitalization she was noted to have several episodes of chest discomfort.  Her troponins were negative.  With further evaluation she mentioned chest pain that has been ongoing for several months.  She underwent nuclear stress test 10/27/2020 that showed small defect of mild severity in the anterior apical location with mild perfusion defect at the anterior apical segment.  It was considered a low risk study with no ischemia.  Medical management was recommended with no further ischemic evaluation.  She was seen by Azalee CourseHao Meng, PA-C on 11/12/2020.  During that time she continued to have intermittent periods of chest discomfort.  She reported that her symptoms occurred 2-3 times per week and she had not noticed an increase in the frequency or duration of her discomfort.  She was accompanied by her daughter.  During her inpatient rehab her metoprolol was increased however the increase cause dizziness.  On 25 mg twice daily of metoprolol her blood pressure was 100/66.  Her metoprolol was further decreased to 12.5 mg twice daily.  Observation was recommended.  A translator was used for the visit.  She presents the clinic today for follow-up evaluation states she has had less chest discomfort with increased physical activity over the past several months.  She is tolerating her metoprolol well.  We reviewed her previous echocardiogram and her and her  daughter expressed understanding.  She reports compliance with her apixaban and denies bleeding issues.  I will repeat her fasting lipids and LFTs, continue heart healthy low-sodium diet, and plan follow-up for 6 months.  Today she denies chest pain, shortness of breath, lower extremity edema, fatigue, palpitations, melena, hematuria, hemoptysis, diaphoresis,  weakness, presyncope, syncope, orthopnea, and PND.  Home Medications    Prior to Admission medications   Medication Sig Start Date End Date Taking? Authorizing Provider  acetaminophen (TYLENOL) 325 MG tablet Take 2 tablets (650 mg total) by mouth every 6 (six) hours as needed for mild pain, headache or fever. 11/09/20   Angiulli, Mcarthur Rossetti, PA-C  apixaban (ELIQUIS) 5 MG TABS tablet Take 1 tablet (5 mg total) by mouth 2 (two) times daily. 11/09/20   Angiulli, Mcarthur Rossetti, PA-C  cetirizine (ZYRTEC) 10 MG tablet Take 10 mg daily as needed by mouth (seasonal allergies).    [provider]  Cholecalciferol (VITAMIN D) 50 MCG (2000 UT) tablet Take 1 tablet (2,000 Units total) by mouth daily. 11/09/20   Angiulli, Mcarthur Rossetti, PA-C  fluticasone (FLONASE) 50 MCG/ACT nasal spray Place 1 spray daily as needed into both nostrils (seasonal allergies).  02/18/14   [provider]  gabapentin (NEURONTIN) 250 MG/5ML solution Take 2 mLs (100 mg total) by mouth at bedtime as needed (neuropathy). 11/09/20   Angiulli, Mcarthur Rossetti, PA-C  lidocaine (LIDODERM) 5 % Place 2 patches onto the skin daily. Remove & Discard patch within 12 hours or as directed by MD 11/09/20   Angiulli, Mcarthur Rossetti, PA-C  metFORMIN (GLUCOPHAGE) 500 MG tablet Take 0.5 tablets (250 mg total) by mouth 2 (two) times daily with a meal. 11/09/20   Angiulli, Mcarthur Rossetti, PA-C  metoprolol tartrate (LOPRESSOR) 25 MG tablet Take 0.5 tablets (12.5 mg total) by mouth 2 (two) times daily. 11/12/20   Azalee Course, PA  mirabegron ER (MYRBETRIQ) 50 MG TB24 tablet Take 1 tablet (50 mg total) by mouth at bedtime. 11/09/20   Angiulli, Mcarthur Rossetti, PA-C  nitroGLYCERIN (NITROSTAT) 0.4 MG SL tablet Place 1 tablet (0.4 mg total) under the tongue every 5 (five) minutes as needed for chest pain. 11/09/20   Angiulli, Mcarthur Rossetti, PA-C  oxyCODONE-acetaminophen (PERCOCET/ROXICET) 5-325 MG tablet Take 1 tablet by mouth every 8 (eight) hours as needed for severe pain. 11/09/20   Angiulli,  Mcarthur Rossetti, PA-C  pantoprazole (PROTONIX) 40 MG tablet Take 1 tablet (40 mg total) by mouth daily. 11/09/20   Angiulli, Mcarthur Rossetti, PA-C  polyethylene glycol (MIRALAX / GLYCOLAX) 17 g packet Take 17 g by mouth daily as needed for mild constipation. 10/20/20   Glade Lloyd, MD  pravastatin (PRAVACHOL) 20 MG tablet Take 1 tablet (20 mg total) by mouth at bedtime. 11/09/20   Angiulli, Mcarthur Rossetti, PA-C  senna-docusate (SENOKOT-S) 8.6-50 MG tablet Take 2 tablets 2 (two) times daily as needed by mouth (while taking narcotic pain medication).    [provider]  tiZANidine (ZANAFLEX) 2 MG tablet Take 1 tablet (2 mg total) by mouth every 8 (eight) hours as needed for muscle spasms. 11/09/20   Angiulli, Mcarthur Rossetti, PA-C  traMADol (ULTRAM) 50 MG tablet Take 1 tablet (50 mg total) by mouth every 6 (six) hours as needed for moderate pain. 11/09/20   Angiulli, Mcarthur Rossetti, PA-C    Family History    Family History  Problem Relation Age of Onset   Diabetes Mellitus II Neg Hx    Hypertension Neg Hx    She indicated that  her mother is deceased. She indicated that her father is deceased. She indicated that her sister is deceased. She indicated that her brother is alive. She indicated that her daughter is alive. She indicated that the status of her neg hx is unknown.  Social History    Social History   Socioeconomic History   Marital status: Widowed    Spouse name: Not on file   Number of children: 2   Years of education: 2   Highest education level: Not on file  Occupational History    Comment: retired  Tobacco Use   Smoking status: Never    Passive exposure: Yes   Smokeless tobacco: Never  Substance and Sexual Activity   Alcohol use: No   Drug use: No   Sexual activity: Never  Other Topics Concern   Not on file  Social History Narrative   Lives w/daughter and grandchildren   caffeine coffee, 1/2 cup daily   Social Determinants of Health   Financial Resource Strain: Not on file  Food  Insecurity: Not on file  Transportation Needs: Not on file  Physical Activity: Not on file  Stress: Not on file  Social Connections: Not on file  Intimate Partner Violence: Not on file     Review of Systems    General:  No chills, fever, night sweats or weight changes.  Cardiovascular:  No chest pain, dyspnea on exertion, edema, orthopnea, palpitations, paroxysmal nocturnal dyspnea. Dermatological: No rash, lesions/masses Respiratory: No cough, dyspnea Urologic: No hematuria, dysuria Abdominal:   No nausea, vomiting, diarrhea, bright red blood per rectum, melena, or hematemesis Neurologic:  No visual changes, wkns, changes in mental status. All other systems reviewed and are otherwise negative except as noted above.  Physical Exam    VS:  BP (!) 94/59 (BP Location: Right Arm)   Pulse 78   Ht 4\' 9"  (1.448 m)   Wt 132 lb 9.6 oz (60.1 kg)   BMI 28.69 kg/m  , BMI Body mass index is 28.69 kg/m. GEN: Well nourished, well developed, in no acute distress. HEENT: normal. Neck: Supple, no JVD, carotid bruits, or masses. Cardiac: RRR, no murmurs, rubs, or gallops. No clubbing, cyanosis, edema.  Radials/DP/PT 2+ and equal bilaterally.  Respiratory:  Respirations regular and unlabored, clear to auscultation bilaterally. GI: Soft, nontender, nondistended, BS + x 4. MS: no deformity or atrophy. Skin: warm and dry, no rash. Neuro:  Strength and sensation are intact. Psych: Normal affect.  Accessory Clinical Findings    Recent Labs: 10/20/2020: Magnesium 2.1 10/21/2020: ALT 12 10/26/2020: BUN 13; Creatinine, Ser 0.66; Hemoglobin 10.3; Platelets 403; Potassium 3.8; Sodium 138   Recent Lipid Panel    Component Value Date/Time   CHOL 128 10/16/2016 0356   TRIG 198 (H) 10/16/2016 0356   HDL 29 (L) 10/16/2016 0356   CHOLHDL 4.4 10/16/2016 0356   VLDL 40 10/16/2016 0356   LDLCALC 59 10/16/2016 0356    ECG personally reviewed by me today-atrial fibrillation 78 bpm- No acute  changes  Echocardiogram 10/17/2016 Study Conclusions   - Left ventricle: The cavity size was normal. There was moderate    concentric hypertrophy. Systolic function was vigorous. The    estimated ejection fraction was in the range of 65% to 70%. Wall    motion was normal; there were no regional wall motion    abnormalities.  - Aortic valve: Valve mobility was trivially restricted.    Transvalvular velocity was increased. There was mild stenosis.    Valve area (VTI):  0.86 cm^2. Valve area (Vmax): 1.05 cm^2. Valve    area (Vmean): 0.89 cm^2.  - Mitral valve: Moderately calcified annulus. There was mild    regurgitation.  - Left atrium: The atrium was moderately dilated.  - Right atrium: The atrium was mildly dilated.  - Pulmonary arteries: Systolic pressure was mildly to moderately    increased. PA peak pressure: 41 mm Hg (S).   Impressions:   - No cardiac source of embolism was identified, but cannot be ruled    out on the basis of this examination.   Recommendations:  Consider transesophageal echocardiography if  clinically indicated.   Myoview 10/27/2020 IMPRESSION: 1. No reversible ischemia or infarction.   2. Normal left ventricular wall motion.   3. Left ventricular ejection fraction 79%   4. Non invasive risk stratification*: Low  Assessment & Plan   1.  Chest pain- no chest pain today.  Had stress testing 10/27/2020 which was negative for ischemia and showed an EF of 79%. Continue metoprolol Heart healthy low-sodium diet-salty 6 given Increase physical activity as tolerated Patient reassured that her chest discomfort was not related to cardiac issues.  Atrial fibrillation-chronic.  Heart rate today 78.  Cardiac unaware.  Previously did not tolerate metoprolol 25 mg twice daily Continue metoprolol 12.5 mg twice daily, Eliquis Heart healthy low-sodium diet-salty 6 given Increase physical activity as tolerated  Essential hypertension-BP today 94/59.  Well-controlled  at home. Continue metoprolol Heart healthy low-sodium diet-salty 6 given Increase physical activity as tolerated  Hyperlipidemia-compliant with statin therapy. Continue pravastatin Heart healthy low-sodium high-fiber diet Increase physical activity as tolerated Order fasting lipids and LFTs  Type 2 diabetes- blood glucose 151 on 10/26/2020 Continue metformin Follows with PCP  Disposition: Follow-up with Dr. Jens Som in 6 months.   [name]. Azaiah Mello NP-C    04/21/2021, 12:30 PM [facility] [address] Office [phone] Fax [phone]  Notice: This dictation was prepared with Dragon dictation along with smaller phrase technology. Any transcriptional errors that result from this process are unintentional and may not be corrected upon review.  I spent 15 minutes examining this patient, reviewing medications, and using patient centered shared decision making involving her cardiac care.  Prior to her visit I spent greater than 20 minutes reviewing her past medical history,  medications, and prior cardiac tests.

## 2021-04-21 ENCOUNTER — Ambulatory Visit (INDEPENDENT_AMBULATORY_CARE_PROVIDER_SITE_OTHER): Payer: Medicare Other | Admitting: General Practice

## 2021-04-21 ENCOUNTER — Other Ambulatory Visit: Payer: Self-pay

## 2021-04-21 ENCOUNTER — Encounter: Payer: Self-pay | Admitting: General Practice

## 2021-04-21 VITALS — BP 94/59 | HR 78 | Ht <= 58 in | Wt 132.6 lb

## 2021-04-21 DIAGNOSIS — R079 Chest pain, unspecified: Secondary | ICD-10-CM

## 2021-04-21 DIAGNOSIS — I482 Chronic atrial fibrillation, unspecified: Secondary | ICD-10-CM

## 2021-04-21 DIAGNOSIS — E119 Type 2 diabetes mellitus without complications: Secondary | ICD-10-CM

## 2021-04-21 DIAGNOSIS — I10 Essential (primary) hypertension: Secondary | ICD-10-CM | POA: Diagnosis not present

## 2021-04-21 DIAGNOSIS — E785 Hyperlipidemia, unspecified: Secondary | ICD-10-CM

## 2021-04-21 NOTE — Patient Instructions (Signed)
Medication Instructions:  The current medical regimen is effective;  continue present plan and medications as directed. Please refer to the Current Medication list given to you today. *If you need a refill on your cardiac medications before your next appointment, please call your pharmacy*  Lab Work: FASTING LIPID AND LFT If you have labs (blood work) drawn today and your tests are completely normal, you will receive your results only by:  MyChart Message (if you have MyChart) OR A paper copy in the mail.  If you have any lab test that is abnormal or we need to change your treatment, we will call you to review the results. You may go to any [facility] that is convenient for you however, we do have a lab in our office that is able to assist you. You DO NOT need an appointment for our lab. The lab is open 8:00am and closes at 4:00pm. Lunch 12:45 - 1:45pm.  Special Instructions PLEASE READ AND FOLLOW SALTY 6-ATTACHED-1,800 mg daily   Follow-Up: Your next appointment:  6 month(s) In Person with Olga Millers, MD OR IF UNAVAILABLE JESSE CLEAVER, FNP-C  At [facility], you and your health needs are our priority.  As part of our continuing mission to provide you with exceptional heart care, we have created designated Provider Care Teams.  These Care Teams include your primary Cardiologist (physician) and Advanced Practice Providers (APPs -  Physician Assistants and Nurse Practitioners) who all work together to provide you with the care you need, when you need it.

## 2021-07-01 ENCOUNTER — Emergency Department (HOSPITAL_COMMUNITY): Payer: Medicare Other

## 2021-07-01 ENCOUNTER — Emergency Department (HOSPITAL_COMMUNITY)
Admission: EM | Admit: 2021-07-01 | Discharge: 2021-07-01 | Disposition: A | Discharge status: Home / Self Care | Payer: Medicare Other | Attending: Emergency Medicine | Admitting: Emergency Medicine

## 2021-07-01 ENCOUNTER — Encounter (HOSPITAL_COMMUNITY): Payer: Self-pay

## 2021-07-01 DIAGNOSIS — W19XXXA Unspecified fall, initial encounter: Secondary | ICD-10-CM

## 2021-07-01 DIAGNOSIS — R519 Headache, unspecified: Secondary | ICD-10-CM | POA: Diagnosis present

## 2021-07-01 DIAGNOSIS — W050XXA Fall from non-moving wheelchair, initial encounter: Secondary | ICD-10-CM | POA: Diagnosis not present

## 2021-07-01 DIAGNOSIS — R0789 Other chest pain: Secondary | ICD-10-CM | POA: Diagnosis not present

## 2021-07-01 DIAGNOSIS — E119 Type 2 diabetes mellitus without complications: Secondary | ICD-10-CM | POA: Insufficient documentation

## 2021-07-01 DIAGNOSIS — R010 Benign and innocent cardiac murmurs: Secondary | ICD-10-CM | POA: Insufficient documentation

## 2021-07-01 HISTORY — DX: Type 2 diabetes mellitus without complications: E11.9

## 2021-07-01 MED ORDER — HYDROCODONE-ACETAMINOPHEN 5-325 MG PO TABS
1.0000 | ORAL_TABLET | Freq: Once | ORAL | Status: AC
Start: 2021-07-01 — End: 2021-07-01
  Administered 2021-07-01: 1 via ORAL
  Filled 2021-07-01: qty 1

## 2021-07-01 NOTE — ED Notes (Signed)
Pt transported to CT via stretcher at this time.  

## 2021-07-01 NOTE — Progress Notes (Signed)
Orthopedic Tech Progress Note Patient Details:  [name] 10/17/1875 [id number] Level 2 Trauma Patient ID: [facility] [name], female   DOB: 10/17/1875, 80 y.o.   MRN: [id number]  Lovett Calender 07/01/2021, 4:29 PM

## 2021-07-01 NOTE — Discharge Instructions (Addendum)
As discussed please return to the emergency department if your mother begins to act confused, behave abnormally, have difficulty moving her limbs or any overall worsening of her condition. It is possible for her to have bleeding that occurs later, however this is not common.  It was a pleasure to meet you both today and I hope she feels better!

## 2021-07-01 NOTE — ED Provider Notes (Signed)
[facility] EMERGENCY DEPARTMENT Provider Note   CSN: [id number] Arrival date & time: 07/01/21  1624     History Chief Complaint  Patient presents with   [facility] is a 80 y.o. female pmh afib and DM who reportedly fell while at [name] doctor's office.  Patient is on 5mg  BID eliquis for her afib.  Patient was seated in a wheelchair when [name] nurses lifted up her legs causing her to fall backwards and hit [name] back of her head onto [name] ground.  Denies loss of consciousness or visual changes.  Per patient daughter patient originally was on Coumadin however she fell in 2015 which caused her to have an intracranial hemorrhage.  Coumadin was stopped and patient had ischemic stroke in 2017.  After that patient was placed on Eliquis.  Currently patient complaining of headache and some discomfort in her chest.  Denies dizziness, lightheadedness or concern for syncope.   Fall Associated symptoms include chest pain and headaches. Pertinent negatives include no shortness of breath.      Past Medical History:  Diagnosis Date   Diabetes mellitus without complication (HCC)     There are no problems to display for this patient.     OB History   No obstetric history on file.     No family history on file.     Home Medications Prior to Admission medications   Not on File    Allergies    Bactrim [sulfamethoxazole-trimethoprim]  Review of Systems   Review of Systems  Constitutional:  Negative for activity change.  Respiratory:  Negative for shortness of breath.   Cardiovascular:  Positive for chest pain. Negative for palpitations.  Neurological:  Positive for headaches. Negative for dizziness, syncope and light-headedness.  Psychiatric/Behavioral:  Negative for confusion.   All other systems reviewed and are negative.  Physical Exam Updated Vital Signs BP 138/78 (BP Location: Left Arm)   Pulse 80   Temp 97.9 F (36.6 C) (Oral)   Resp 19   Ht 4\' 8"   (1.422 m)   Wt 61.2 kg   SpO2 99%   BMI 30.27 kg/m   Physical Exam Vitals and nursing note reviewed.  Constitutional:      Appearance: Normal appearance.  HENT:     Head: Normocephalic.     Comments: Patient with 2 cm knot to [name] posterior skull.  Associated erythema.  No bleeding.  Mildly tender to [name] touch. Eyes:     General: No scleral icterus.    Extraocular Movements: Extraocular movements intact.     Conjunctiva/sclera: Conjunctivae normal.     Pupils: Pupils are equal, round, and reactive to light.  Cardiovascular:     Rate and Rhythm: Normal rate. Rhythm irregular.  Pulmonary:     Effort: Pulmonary effort is normal. No respiratory distress.  Skin:    General: Skin is warm and dry.     Findings: No rash.  Neurological:     Mental Status: She is alert.  Psychiatric:        Mood and Affect: Mood normal.    ED Results / Procedures / Treatments   Labs (all labs ordered are listed, but only abnormal results are displayed) Labs Reviewed - No data to display  EKG None  Radiology CT HEAD WO CONTRAST (2018)  Result Date: 07/01/2021 CLINICAL DATA:  Fall Eliquis hit head against wall EXAM: CT HEAD WITHOUT CONTRAST CT CERVICAL SPINE WITHOUT CONTRAST TECHNIQUE: Multidetector CT imaging of [name] head and  cervical spine was performed following [name] standard protocol without intravenous contrast. Multiplanar CT image reconstructions of [name] cervical spine were also generated. COMPARISON:  CT brain 10/17/2020, CT brain and cervical spine 05/11/2014 FINDINGS: CT HEAD FINDINGS Brain: No acute territorial infarction, hemorrhage or intracranial mass. Moderate atrophy. Mild to moderate chronic small-vessel ischemic change of white matter. Chronic right deep white matter with ex vacuo dilatation of [name] right lateral ventricle. Overall stable ventricle size. Vascular: No hyperdense vessel.  Carotid vascular calcification Skull: Normal. Negative for fracture or focal lesion. Sinuses/Orbits: No  acute finding. Other: None CT CERVICAL SPINE FINDINGS Alignment: Straightening of [name] cervical spine. No subluxation. Facet alignment within normal limits Skull base and vertebrae: No acute fracture. No primary bone lesion or focal pathologic process. Soft tissues and spinal canal: No prevertebral fluid or swelling. No visible canal hematoma. Disc levels: Mild degenerative change C5-C6 and C6-C7. Foramen are patent bilaterally. Upper chest: Negative. Other: None IMPRESSION: 1. No CT evidence for acute intracranial abnormality. Atrophy and chronic small vessel ischemic changes of [name] white matter. Chronic right basal ganglial infarct. 2. Straightening of [name] cervical spine. No acute osseous abnormality Electronically Signed   By: Jasmine Pang M.D.   On: 07/01/2021 18:32   CT Cervical Spine Wo Contrast  Result Date: 07/01/2021 CLINICAL DATA:  Fall Eliquis hit head against wall EXAM: CT HEAD WITHOUT CONTRAST CT CERVICAL SPINE WITHOUT CONTRAST TECHNIQUE: Multidetector CT imaging of [name] head and cervical spine was performed following [name] standard protocol without intravenous contrast. Multiplanar CT image reconstructions of [name] cervical spine were also generated. COMPARISON:  CT brain 10/17/2020, CT brain and cervical spine 05/11/2014 FINDINGS: CT HEAD FINDINGS Brain: No acute territorial infarction, hemorrhage or intracranial mass. Moderate atrophy. Mild to moderate chronic small-vessel ischemic change of white matter. Chronic right deep white matter with ex vacuo dilatation of [name] right lateral ventricle. Overall stable ventricle size. Vascular: No hyperdense vessel.  Carotid vascular calcification Skull: Normal. Negative for fracture or focal lesion. Sinuses/Orbits: No acute finding. Other: None CT CERVICAL SPINE FINDINGS Alignment: Straightening of [name] cervical spine. No subluxation. Facet alignment within normal limits Skull base and vertebrae: No acute fracture. No primary bone lesion or focal pathologic  process. Soft tissues and spinal canal: No prevertebral fluid or swelling. No visible canal hematoma. Disc levels: Mild degenerative change C5-C6 and C6-C7. Foramen are patent bilaterally. Upper chest: Negative. Other: None IMPRESSION: 1. No CT evidence for acute intracranial abnormality. Atrophy and chronic small vessel ischemic changes of [name] white matter. Chronic right basal ganglial infarct. 2. Straightening of [name] cervical spine. No acute osseous abnormality Electronically Signed   By: Jasmine Pang M.D.   On: 07/01/2021 18:32    Procedures Procedures   Medications Ordered in ED Medications - No data to display  ED Course  I have reviewed [name] triage vital signs and [name] nursing notes.  Pertinent labs & imaging results that were available during my care of [name] patient were reviewed by me and considered in my medical decision making (see chart for details).    MDM Rules/Calculators/A&P Chenika Heslin is a 80 y.o. female pmh afib and DM who reportedly fell while at [name] doctor's office.  Patient is on 5mg  BID eliquis for her afib.  Patient was seated in a wheelchair when [name] nurses lifted up her legs causing her to fall backwards and hit [name] back of her head onto [name] ground.  Denies loss of consciousness or visual changes.  Per patient daughter patient originally was  on Coumadin however she fell in 2015 which caused her to have an intracranial hemorrhage.  Coumadin was stopped and patient had ischemic stroke in 2017.  After that patient was placed on Eliquis.  Currently patient complaining of headache and some discomfort in her chest.  Denies dizziness, lightheadedness or concern for syncope.  CT had and cervical spine without acute fractures or bleeding.    Daughter denied need for interpreter today.  She reported that she would be able to interpret. Daughter reports that patient seems A&O and at her baseline. Patient reportedly takes tramadol for chronic pain and Norco as needed for severe pain.   Daughter requesting a dose of patient's Norco.  I am okay with giving that.  Because [name] fall seem mechanical in nature and was not due to any balance concerns with [name] patient I believe she is stable for discharge today.  She will return if she becomes symptomatic or if her headache continues despite treatment.  Daughter understanding of this plan.  Patient should continue her Eliquis to avoid repeat ischemic stroke. Patient and daughter agreeable and stable for discharge.  Final Clinical Impression(s) / ED Diagnoses Final diagnoses:  Fall, initial encounter    Rx / DC Orders Results and diagnoses were explained to [name] patient. Return precautions discussed in full. Patient had no additional questions and expressed complete understanding.     Woodroe Chen 07/01/21 1900    Mancel Bale, MD 07/03/21 1140

## 2021-07-01 NOTE — Progress Notes (Signed)
   07/01/21 1620  Clinical Encounter Type  Visited With Patient not available  Visit Type Trauma  Referral From Nurse  Consult/Referral To Chaplain    Chaplain responded. The patient is being attended to by the medical team. A support person is also at the bedside. Chaplain remains available for follow-up spiritual/emotional support as needed. This note was prepared by Deneen Harts, M.Div..  For questions please contact by phone [phone].

## 2021-07-01 NOTE — ED Notes (Signed)
Pt reports worsening headache since arriving. MD notified via secure chat.

## 2021-07-01 NOTE — ED Triage Notes (Signed)
Pt BIB [facility] as Level 2 trauma from doctor's office for a fall. Pt takes Eliquis. Per daughter, pt was sitting in a wheelchair when the chair tilted back and pt hit head against wall.

## 2021-07-02 ENCOUNTER — Encounter: Payer: Self-pay | Admitting: General Practice

## 2021-12-09 ENCOUNTER — Observation Stay (HOSPITAL_COMMUNITY)
Admission: EM | Admit: 2021-12-09 | Discharge: 2021-12-10 | Disposition: A | Discharge status: Home Health | Payer: Medicare Other | Attending: Internal Medicine | Admitting: Internal Medicine

## 2021-12-09 ENCOUNTER — Emergency Department (HOSPITAL_COMMUNITY): Payer: Medicare Other

## 2021-12-09 ENCOUNTER — Observation Stay (HOSPITAL_COMMUNITY): Payer: Medicare Other

## 2021-12-09 ENCOUNTER — Other Ambulatory Visit: Payer: Self-pay

## 2021-12-09 DIAGNOSIS — Z8673 Personal history of transient ischemic attack (TIA), and cerebral infarction without residual deficits: Secondary | ICD-10-CM | POA: Diagnosis not present

## 2021-12-09 DIAGNOSIS — I480 Paroxysmal atrial fibrillation: Secondary | ICD-10-CM | POA: Diagnosis not present

## 2021-12-09 DIAGNOSIS — R55 Syncope and collapse: Secondary | ICD-10-CM | POA: Diagnosis present

## 2021-12-09 DIAGNOSIS — Z7984 Long term (current) use of oral hypoglycemic drugs: Secondary | ICD-10-CM | POA: Diagnosis not present

## 2021-12-09 DIAGNOSIS — G4733 Obstructive sleep apnea (adult) (pediatric): Secondary | ICD-10-CM

## 2021-12-09 DIAGNOSIS — R9389 Abnormal findings on diagnostic imaging of other specified body structures: Secondary | ICD-10-CM | POA: Diagnosis not present

## 2021-12-09 DIAGNOSIS — I482 Chronic atrial fibrillation, unspecified: Secondary | ICD-10-CM | POA: Diagnosis present

## 2021-12-09 DIAGNOSIS — E1142 Type 2 diabetes mellitus with diabetic polyneuropathy: Secondary | ICD-10-CM | POA: Diagnosis present

## 2021-12-09 DIAGNOSIS — E1140 Type 2 diabetes mellitus with diabetic neuropathy, unspecified: Secondary | ICD-10-CM | POA: Diagnosis not present

## 2021-12-09 DIAGNOSIS — R918 Other nonspecific abnormal finding of lung field: Secondary | ICD-10-CM | POA: Diagnosis not present

## 2021-12-09 DIAGNOSIS — D539 Nutritional anemia, unspecified: Secondary | ICD-10-CM | POA: Diagnosis not present

## 2021-12-09 DIAGNOSIS — Y92009 Unspecified place in unspecified non-institutional (private) residence as the place of occurrence of the external cause: Secondary | ICD-10-CM

## 2021-12-09 DIAGNOSIS — Z20822 Contact with and (suspected) exposure to covid-19: Secondary | ICD-10-CM | POA: Diagnosis not present

## 2021-12-09 DIAGNOSIS — M545 Low back pain, unspecified: Secondary | ICD-10-CM | POA: Diagnosis not present

## 2021-12-09 DIAGNOSIS — S0990XA Unspecified injury of head, initial encounter: Secondary | ICD-10-CM | POA: Insufficient documentation

## 2021-12-09 DIAGNOSIS — Z7722 Contact with and (suspected) exposure to environmental tobacco smoke (acute) (chronic): Secondary | ICD-10-CM | POA: Insufficient documentation

## 2021-12-09 DIAGNOSIS — M549 Dorsalgia, unspecified: Secondary | ICD-10-CM

## 2021-12-09 DIAGNOSIS — W0110XA Fall on same level from slipping, tripping and stumbling with subsequent striking against unspecified object, initial encounter: Secondary | ICD-10-CM | POA: Diagnosis not present

## 2021-12-09 DIAGNOSIS — W19XXXA Unspecified fall, initial encounter: Secondary | ICD-10-CM

## 2021-12-09 DIAGNOSIS — I6930 Unspecified sequelae of cerebral infarction: Secondary | ICD-10-CM

## 2021-12-09 DIAGNOSIS — Z79899 Other long term (current) drug therapy: Secondary | ICD-10-CM | POA: Diagnosis not present

## 2021-12-09 DIAGNOSIS — Z7901 Long term (current) use of anticoagulants: Secondary | ICD-10-CM | POA: Diagnosis not present

## 2021-12-09 LAB — PROTIME-INR
INR: 1.2 (ref 0.8–1.2)
Prothrombin Time: 15.5 seconds — ABNORMAL HIGH (ref 11.4–15.2)

## 2021-12-09 LAB — CBC WITH DIFFERENTIAL/PLATELET
Abs Immature Granulocytes: 0.02 10*3/uL (ref 0.00–0.07)
Basophils Absolute: 0.1 10*3/uL (ref 0.0–0.1)
Basophils Relative: 1 %
Eosinophils Absolute: 0.1 10*3/uL (ref 0.0–0.5)
Eosinophils Relative: 2 %
HCT: 35.3 % — ABNORMAL LOW (ref 36.0–46.0)
Hemoglobin: 11.5 g/dL — ABNORMAL LOW (ref 12.0–15.0)
Immature Granulocytes: 0 %
Lymphocytes Relative: 35 %
Lymphs Abs: 2.2 10*3/uL (ref 0.7–4.0)
MCH: 33.9 pg (ref 26.0–34.0)
MCHC: 32.6 g/dL (ref 30.0–36.0)
MCV: 104.1 fL — ABNORMAL HIGH (ref 80.0–100.0)
Monocytes Absolute: 0.7 10*3/uL (ref 0.1–1.0)
Monocytes Relative: 10 %
Neutro Abs: 3.3 10*3/uL (ref 1.7–7.7)
Neutrophils Relative %: 52 %
Platelets: 328 10*3/uL (ref 150–400)
RBC: 3.39 MIL/uL — ABNORMAL LOW (ref 3.87–5.11)
RDW: 12.7 % (ref 11.5–15.5)
WBC: 6.4 10*3/uL (ref 4.0–10.5)
nRBC: 0 % (ref 0.0–0.2)

## 2021-12-09 LAB — URINALYSIS, ROUTINE W REFLEX MICROSCOPIC
Bacteria, UA: NONE SEEN
Bilirubin Urine: NEGATIVE
Glucose, UA: NEGATIVE mg/dL
Ketones, ur: NEGATIVE mg/dL
Nitrite: NEGATIVE
Protein, ur: NEGATIVE mg/dL
Specific Gravity, Urine: 1.008 (ref 1.005–1.030)
pH: 8 (ref 5.0–8.0)

## 2021-12-09 LAB — COMPREHENSIVE METABOLIC PANEL
ALT: 11 U/L (ref 0–44)
AST: 15 U/L (ref 15–41)
Albumin: 4 g/dL (ref 3.5–5.0)
Alkaline Phosphatase: 62 U/L (ref 38–126)
Anion gap: 8 (ref 5–15)
BUN: 14 mg/dL (ref 8–23)
CO2: 30 mmol/L (ref 22–32)
Calcium: 9.9 mg/dL (ref 8.9–10.3)
Chloride: 104 mmol/L (ref 98–111)
Creatinine, Ser: 0.76 mg/dL (ref 0.44–1.00)
GFR, Estimated: >60 mL/min (ref >60)
Glucose, Bld: 139 mg/dL — ABNORMAL HIGH (ref 70–99)
Potassium: 4.3 mmol/L (ref 3.5–5.1)
Sodium: 142 mmol/L (ref 135–145)
Total Bilirubin: 0.4 mg/dL (ref 0.3–1.2)
Total Protein: 6.5 g/dL (ref 6.5–8.1)

## 2021-12-09 LAB — MAGNESIUM: Magnesium: 1.9 mg/dL (ref 1.7–2.4)

## 2021-12-09 LAB — RESP PANEL BY RT-PCR (FLU A&B, COVID) ARPGX2
Influenza A by PCR: NEGATIVE
Influenza B by PCR: NEGATIVE
SARS Coronavirus 2 by RT PCR: NEGATIVE

## 2021-12-09 LAB — APTT: aPTT: 52 seconds — ABNORMAL HIGH (ref 24–36)

## 2021-12-09 LAB — GLUCOSE, CAPILLARY: Glucose-Capillary: 118 mg/dL — ABNORMAL HIGH (ref 70–99)

## 2021-12-09 MED ORDER — APIXABAN 5 MG PO TABS
5.0000 mg | ORAL_TABLET | Freq: Two times a day (BID) | ORAL | Status: DC
  Administered 2021-12-09 – 2021-12-10 (×3): 5 mg via ORAL
  Filled 2021-12-09 (×3): qty 1

## 2021-12-09 MED ORDER — LORATADINE 10 MG PO TABS
10.0000 mg | ORAL_TABLET | Freq: Every day | ORAL | Status: DC
  Filled 2021-12-09 (×2): qty 1

## 2021-12-09 MED ORDER — TIZANIDINE HCL 2 MG PO TABS
2.0000 mg | ORAL_TABLET | Freq: Every evening | ORAL | Status: DC | PRN
  Filled 2021-12-09: qty 2

## 2021-12-09 MED ORDER — HYDROCORTISONE 1 % EX CREA
1.0000 "application " | TOPICAL_CREAM | Freq: Every day | CUTANEOUS | Status: DC | PRN
  Filled 2021-12-09 (×2): qty 28

## 2021-12-09 MED ORDER — PRAVASTATIN SODIUM 10 MG PO TABS
20.0000 mg | ORAL_TABLET | Freq: Every day | ORAL | Status: DC
  Administered 2021-12-09: 20 mg via ORAL
  Filled 2021-12-09: qty 2

## 2021-12-09 MED ORDER — TRAMADOL HCL 50 MG PO TABS
50.0000 mg | ORAL_TABLET | Freq: Four times a day (QID) | ORAL | Status: DC | PRN
  Administered 2021-12-09: 50 mg via ORAL
  Filled 2021-12-09: qty 1

## 2021-12-09 MED ORDER — POLYETHYLENE GLYCOL 3350 17 G PO PACK: 17.0000 g | PACK | Freq: Every day | ORAL | Status: DC | PRN

## 2021-12-09 MED ORDER — ALBUTEROL SULFATE (2.5 MG/3ML) 0.083% IN NEBU: 2.5000 mg | INHALATION_SOLUTION | Freq: Four times a day (QID) | RESPIRATORY_TRACT | Status: DC | PRN

## 2021-12-09 MED ORDER — METOPROLOL TARTRATE 12.5 MG HALF TABLET
12.5000 mg | ORAL_TABLET | Freq: Two times a day (BID) | ORAL | Status: DC
  Administered 2021-12-09 – 2021-12-10 (×3): 12.5 mg via ORAL
  Filled 2021-12-09 (×3): qty 1

## 2021-12-09 MED ORDER — LIDOCAINE 5 % EX PTCH: 2.0000 | MEDICATED_PATCH | Freq: Every day | CUTANEOUS | Status: DC | PRN

## 2021-12-09 MED ORDER — TIZANIDINE HCL 2 MG PO TABS
2.0000 mg | ORAL_TABLET | Freq: Every evening | ORAL | Status: DC | PRN
  Filled 2021-12-09: qty 1

## 2021-12-09 MED ORDER — ACETAMINOPHEN 325 MG PO TABS
650.0000 mg | ORAL_TABLET | Freq: Four times a day (QID) | ORAL | Status: DC | PRN
  Administered 2021-12-10: 650 mg via ORAL
  Filled 2021-12-09: qty 2

## 2021-12-09 MED ORDER — ACETAMINOPHEN 650 MG RE SUPP: 650.0000 mg | Freq: Four times a day (QID) | RECTAL | Status: DC | PRN

## 2021-12-09 MED ORDER — DULOXETINE HCL 30 MG PO CPEP
30.0000 mg | ORAL_CAPSULE | Freq: Every day | ORAL | Status: DC
  Administered 2021-12-09: 30 mg via ORAL
  Filled 2021-12-09 (×2): qty 1

## 2021-12-09 MED ORDER — BETAMETHASONE VALERATE 0.1 % EX LOTN
TOPICAL_LOTION | Freq: Two times a day (BID) | CUTANEOUS | Status: DC | PRN
  Filled 2021-12-09: qty 60

## 2021-12-09 MED ORDER — DICLOFENAC SODIUM 1 % EX GEL
2.0000 g | Freq: Four times a day (QID) | CUTANEOUS | Status: DC
  Administered 2021-12-09 – 2021-12-10 (×3): 2 g via TOPICAL
  Filled 2021-12-09: qty 100

## 2021-12-09 MED ORDER — SODIUM CHLORIDE 0.9% FLUSH
3.0000 mL | Freq: Two times a day (BID) | INTRAVENOUS | Status: DC
  Administered 2021-12-09 – 2021-12-10 (×3): 3 mL via INTRAVENOUS

## 2021-12-09 MED ORDER — FLUTICASONE PROPIONATE 50 MCG/ACT NA SUSP: 1.0000 | Freq: Every day | NASAL | Status: DC | PRN

## 2021-12-09 MED ORDER — LACTATED RINGERS IV BOLUS
1000.0000 mL | Freq: Once | INTRAVENOUS | Status: AC
  Administered 2021-12-09: 1000 mL via INTRAVENOUS

## 2021-12-09 NOTE — Assessment & Plan Note (Deleted)
Acute.  Patient denies any complaints of fever or cough.  Chest x-ray was of suboptimal quality with low lung volumes with note of pulmonary vascular congestion and left lower lobe opacity concerning for atelectasis versus pneumonia.  At this time suspect findings more likely atelectasis. -Check procalcitonin level

## 2021-12-09 NOTE — ED Provider Notes (Incomplete)
Patient is an 81 year old female with chronic atrial fibrillation on chronic anticoagulation, diabetes, prior subdural hematoma after falling who presents today as a level 2 trauma.  Patient was going to the bathroom when she had a syncopal event.  Family is present and reports that she felt lightheaded when she stood up this morning and they stayed with her for quite some time and she thought she was better and then within minutes had a syncopal event when going to the bathroom.  Patient denies any localized pain anywhere.  She is aware awake alert and oriented currently.  She is currently in atrial fibrillation and I independently interpreted patient's EKG which shows no acute changes.  Patient continues to be symptomatic here but blood pressure is fine and family reported normal blood pressure at home as well.  Given patient's known heart history, ongoing symptoms, recent fall with a head CT which is negative for acute bleed feel that she needs work-up for syncope.  She has not recently had echo.  Patient and family are comfortable with this plan.

## 2021-12-09 NOTE — Assessment & Plan Note (Addendum)
Patient presents after a fall at home today where she was using a walker.  No reports of loss of consciousness or trauma to the head.  Daughter states that the patient has seemed to have increased weakness along with having difficulty standing and seems to want to fall backwards.  CT imaging the brain was negative for any acute abnormality, but did note prior stroke.  Orthostatic vital signs within normal limits.  MR brain without contrast with no acute intracranial abnormality, but notable for moderate cerebral white matter chronic small vessel disease, chronic infarct right corona radiata/basal ganglia.  Patient was seen by PT and OT with recommendation of home health PT.

## 2021-12-09 NOTE — ED Notes (Addendum)
Trauma Response Nurse Documentation   [name] is a 81 y.o. female arriving to [facility] ED via EMS  On Eliquis (apixaban) daily. Trauma was activated as a Level 2 based on the following trauma criteria Elderly patients > 65 with head trauma on anti-coagulation (excluding ASA). Trauma team at the bedside on patient arrival. Patient cleared for CT by Dr. Maryan Rued. Patient to CT with team. GCS 15.  History   Past Medical History:  Diagnosis Date   Atrial fibrillation ([place])    Chronic back pain    "mid back down into lower back" (08/25/2014)   Diabetes mellitus without complication (HCC)    Frequent falls    GERD (gastroesophageal reflux disease)    Hypertriglyceridemia    OSA on CPAP    Osteoarthritis    "knees, hands, back" (08/25/2014)   Pneumonia ~ 2000 X 1   Subdural hematoma ([place]) july 2015   S/P fall while on Coumadin   T12 compression fracture ([place]) 2012   Type II diabetes mellitus ([place])      Past Surgical History:  Procedure Laterality Date   APPENDECTOMY  2012   CATARACT EXTRACTION W/ INTRAOCULAR LENS  IMPLANT, BILATERAL Bilateral 2000's   IR GENERIC HISTORICAL  10/15/2016   IR PERCUTANEOUS ART THROMBECTOMY/INFUSION INTRACRANIAL INC DIAG ANGIO 10/15/2016 Lauren Bras, MD [facility] RAD   IR GENERIC HISTORICAL  12/13/2016   IR RADIOLOGIST EVAL & MGMT 12/13/2016 [facility]   RADIOLOGY WITH ANESTHESIA N/A 10/15/2016   Procedure: RADIOLOGY WITH ANESTHESIA;  Surgeon: Lauren Bras, MD;  Location: [place];  Service: Radiology;  Laterality: N/A;   TOTAL ABDOMINAL HYSTERECTOMY  1990       Initial Focused Assessment (If applicable, or please see trauma documentation): A&Ox4, vietnamese speaking at baseline, GCS 15 Abrasion to head, has been falling/feeling dizzy lately  CT's Completed:   CT Head and CT C-Spine   Interventions:  CXR/PXR CT head/c-spine  All imaging negative for acute traumatic injury.  Bedside handoff with ED RN [name].    [name]   Trauma Response RN  Please call TRN at [phone] for further assistance.

## 2021-12-09 NOTE — Assessment & Plan Note (Addendum)
At this time patient appears to be relatively rate controlled.  Continue metoprolol and Eliquis. May need to rediscuss risks and benefits with the patient's cardiologist if patient continues to have frequent falls

## 2021-12-09 NOTE — Assessment & Plan Note (Addendum)
On admission glucose was 139.  Home medication regimen includes metformin 500 mg twice daily.  Last available hemoglobin A1c was 6.4 10/18/2020.

## 2021-12-09 NOTE — H&P (Signed)
Let History and Physical    Patient: [name] [id number] DOB: [birth year] DOA: 12/09/2021 DOS: the patient was seen and examined on 12/09/2021 PCP: Haydee Salter, MD  Patient coming from: Home  Chief Complaint:  Chief Complaint  Patient presents with   Fall    HPI: [name] is a 81 y.o. female with medical history significant of chronic atrial fibrillation on anticoagulation, diabetes mellitus type 2, CVA, subdural hematoma, frequent falls, GERD, and OSA not on CPAP present after having a fall at home.  History is obtained with use of interpreter services and talks with patient's daughter over the phone.  Normally patient gets around with use of a walker and was using it when she fell this morning.  She fell and landed on her buttocks.  Denies any loss of consciousness or significant trauma to her head, but her daughter reported that she complained of dizziness.  Ever since patient had a stroke back in 2017 she has had residual left-sided weakness.  However, her daughter notes that he recently she has had progressive weakness of the bilateral lower extremities.  Whenever the patient is standing up from a sitting it seems like she has been having a more difficult time and could fall backwards.  Patient denies any recent fever, chest pain, palpitations, cough, nausea, vomiting, diarrhea, blood in stool/urine.  Her daughter also brings up the fact that she has been having urinary frequency especially at night for which she is concerned that she may fall and has had increased daytime sleepiness.  Family notes that they have been limiting her pain medications due to fear of increasing risk of falls.  Patient came into the emergency department as a level 2 trauma.  She was noted to be afebrile, in atrial fibrillation with rates controlled 8186, blood pressures 129/81-156/91, and all other vital signs maintained.  Labs noted hemoglobin 11.5 with elevated MCV of 104.1, INR 1.2, APTT 52, and all other labs  relatively stable.Chest x-ray noted low lung volumes with mild pulmonary vascular congestion and left lobe opacity reflecting possible atelectasis or pneumonia.  X-rays of the pelvis noted no acute fractures and chronic bilateral superior and inferior rami fractures.  CT scan of the head and cervical spine did not note any acute intracranial abnormality. Patient was given 1 L normal saline IV fluid.     Review of Systems: As mentioned in the history of present illness. All other systems reviewed and are negative. Past Medical History:  Diagnosis Date   Atrial fibrillation ([place])    Chronic back pain    "mid back down into lower back" (08/25/2014)   Diabetes mellitus without complication (HCC)    Frequent falls    GERD (gastroesophageal reflux disease)    Hypertriglyceridemia    OSA on CPAP    Osteoarthritis    "knees, hands, back" (08/25/2014)   Pneumonia ~ 2000 X 1   Subdural hematoma ([place]) july 2015   S/P fall while on Coumadin   T12 compression fracture ([place]) 2012   Type II diabetes mellitus ([place])    Past Surgical History:  Procedure Laterality Date   APPENDECTOMY  2012   CATARACT EXTRACTION W/ INTRAOCULAR LENS  IMPLANT, BILATERAL Bilateral 2000's   IR GENERIC HISTORICAL  10/15/2016   IR PERCUTANEOUS ART THROMBECTOMY/INFUSION INTRACRANIAL INC DIAG ANGIO 10/15/2016 [name], MD [facility]   IR GENERIC HISTORICAL  12/13/2016   IR RADIOLOGIST EVAL & MGMT 12/13/2016 [facility]   RADIOLOGY WITH ANESTHESIA N/A 10/15/2016   Procedure: RADIOLOGY  WITH ANESTHESIA;  Surgeon: [name], MD;  Location: [place];  Service: Radiology;  Laterality: N/A;   TOTAL ABDOMINAL HYSTERECTOMY  1990   Social History:  reports that she does not have a smoking history on file. She has been exposed to tobacco smoke. She has never used smokeless tobacco. She reports that she does not drink alcohol and does not use drugs.  Allergies  Allergen Reactions   Bactrim  [Sulfamethoxazole-Trimethoprim] Cough    And flushing of face and conjunctivitis.    Bactrim [Sulfamethoxazole-Trimethoprim]     Family History  Problem Relation Age of Onset   Diabetes Mellitus II Neg Hx    Hypertension Neg Hx     Prior to Admission medications   Medication Sig Start Date End Date Taking? Authorizing Provider  acetaminophen (TYLENOL) 325 MG tablet Take 2 tablets (650 mg total) by mouth every 6 (six) hours as needed for mild pain, headache or fever. 11/09/20  Yes Angiulli, Lavon Paganini, PA-C  apixaban (ELIQUIS) 5 MG TABS tablet Take 1 tablet (5 mg total) by mouth 2 (two) times daily. 11/09/20  Yes Angiulli, Lavon Paganini, PA-C  Calcium Carbonate (CALCIUM-CARB 600 PO) Take 1 tablet by mouth daily. Gummy   Yes [provider]  cetirizine (ZYRTEC) 10 MG tablet Take 10 mg daily as needed by mouth (seasonal allergies).   Yes [provider]  Cholecalciferol (VITAMIN D) 50 MCG (2000 UT) tablet Take 1 tablet (2,000 Units total) by mouth daily. 11/09/20  Yes Angiulli, Lavon Paganini, PA-C  DULoxetine (CYMBALTA) 30 MG capsule Take 30 mg by mouth at bedtime. 10/14/21  Yes [provider]  fluticasone (FLONASE) 50 MCG/ACT nasal spray Place 1 spray daily as needed into both nostrils (seasonal allergies).  02/18/14  Yes [provider]  FORTEO 600 MCG/2.4ML SOPN Inject 600 mcg into the skin at bedtime. 04/12/21  Yes [provider]  HYDROcodone-acetaminophen (NORCO/VICODIN) 5-325 MG tablet Take 1 tablet by mouth every 6 (six) hours as needed for moderate pain.   Yes [provider]  hydrocortisone 2.5 % cream Apply 1 application topically daily as needed (itching). 06/17/21  Yes [provider]  lidocaine (LIDODERM) 5 % Place 2 patches onto the skin daily. Remove & Discard patch within 12 hours or as directed by MD Patient taking differently: Place 2 patches onto the skin daily as needed (back pain). Remove & Discard patch within 12 hours or as  directed by MD 11/09/20  Yes Angiulli, Lavon Paganini, PA-C  metFORMIN (GLUCOPHAGE) 500 MG tablet Take 0.5 tablets (250 mg total) by mouth 2 (two) times daily with a meal. Patient taking differently: Take 500 mg by mouth 2 (two) times daily with a meal. 11/09/20  Yes Angiulli, Lavon Paganini, PA-C  metoprolol tartrate (LOPRESSOR) 25 MG tablet Take 0.5 tablets (12.5 mg total) by mouth 2 (two) times daily. 11/12/20  Yes Almyra Deforest, PA  nitroGLYCERIN (NITROSTAT) 0.4 MG SL tablet Place 1 tablet (0.4 mg total) under the tongue every 5 (five) minutes as needed for chest pain. 11/09/20  Yes Angiulli, Lavon Paganini, PA-C  polyethylene glycol (MIRALAX / GLYCOLAX) 17 g packet Take 17 g by mouth daily as needed for mild constipation. 10/20/20  Yes Aline August, MD  pravastatin (PRAVACHOL) 20 MG tablet Take 1 tablet (20 mg total) by mouth at bedtime. 11/09/20  Yes Angiulli, Lavon Paganini, PA-C  tiZANidine (ZANAFLEX) 4 MG tablet Take 2-4 mg by mouth at bedtime as needed for muscle spasms. 10/05/21  Yes [provider]  traMADol (ULTRAM) 50 MG tablet Take 1 tablet (50 mg total) by mouth every 6 (six) hours as needed for moderate pain. 11/09/20  Yes Angiulli, Lavon Paganini, PA-C  vitamin k 100 MCG tablet Take 100 mcg by mouth daily.   Yes [provider]    Physical Exam: Vitals:   12/09/21 0916 12/09/21 1000 12/09/21 1043 12/09/21 1045  BP:  129/84 129/81 140/77  Pulse:  86 85 83  Resp:  15 13 14   Temp:      TempSrc:      SpO2:  100% 99% 99%  Weight: 61.2 kg     Height: 4\' 8"  (1.422 m)      Constitutional: Elderly female currently no acute distress Eyes: PERRL, lids and conjunctivae normal ENMT: Mucous membranes are moist. Posterior pharynx clear of any exudate or lesions.  Neck: normal, supple, no masses, no thyromegaly Respiratory: clear to auscultation bilaterally, no wheezing, no crackles. Normal respiratory effort. Cardiovascular: Irregular irregular. No extremity edema. 2+ pedal pulses. No carotid bruits.   Abdomen: no tenderness.  Healed prior surgical scar of the abdomen.  Bowel sounds positive.  Musculoskeletal: no clubbing / cyanosis.  Severe crepitation noted of the bilateral lower extremities. Neurologic: CN 2-12 grossly intact.  Strength 4/5 on the left side and 5/seems to be 5/5 on the right Psychiatric: Fair judgment and insight. Alert and oriented x 3. Normal mood.    Data Reviewed:  EKG revealed atrial fibrillation 89 bpm  Assessment and Plan: * Fall at home, initial encounter Patient presents after a fall at home today where she was using a walker.  No reports of loss of consciousness or trauma to the head.  Daughter states that the patient has seemed to have increased weakness along with having difficulty standing and seems to want to fall backwards.  CT imaging the brain was negative for any acute abnormality, but did note prior stroke. -Neurochecks -Check orthostatic vital signs -Check MRI of the brain rule out possibility of stroke -Follow-up telemetry overnight -PT/OT to evaluate  History of CVA with residual deficit Patient with prior right putamen and caudate stroke back in 2017 resulting in residual left-sided weakness. -Continue Eliquis and pravastatin  Back pain Acute.  Patient notes tenderness to palpation of the lumbar spine on physical exam. -Check x-rays of the lumbar spine  Abnormal chest x-ray Acute.  Patient denies any complaints of fever or cough.  Chest x-ray was of suboptimal quality with low lung volumes with note of pulmonary vascular congestion and left lower lobe opacity concerning for atelectasis versus pneumonia.  At this time suspect findings more likely atelectasis. -Check procalcitonin level  Macrocytic anemia Chronic.  Patient presents with hemoglobin 11.5 with MCV 104.  Review of records notes that is chronically been elevated. -Check vitamin B12 levels as this could be related to patients frequent falls  Type 2 diabetes mellitus with  peripheral neuropathy ([place])- (present on admission) On admission glucose was 139.  Home medication regimen includes metformin 500 mg twice daily.  Last available hemoglobin A1c was 6.4 10/18/2020. -Carb modified diet -Consider starting sliding scale of insulin if blood sugars noted to trend greater than 180 on daily labs  OSA (obstructive sleep apnea)- (present on admission) Previously prescribed CPAP, but did not tolerate it well.  This could be a possible cause of patient's excessive daytime sleepiness.  Chronic atrial fibrillation ([place])- (present on admission) At this time patient appears to be relatively rate controlled.  -Continue metoprolol and Eliquis -May need to rediscuss  risks and benefits with the patient's cardiologist if patient continues to have frequent falls       Advance Care Planning:   Code Status: Full Code   Consults: None  Family Communication: Daughter updated over the phone  Severity of Illness: The appropriate patient status for this patient is OBSERVATION. Observation status is judged to be reasonable and necessary in order to provide the required intensity of service to ensure the patient's safety. The patient's presenting symptoms, physical exam findings, and initial radiographic and laboratory data in the context of their medical condition is felt to place them at decreased risk for further clinical deterioration. Furthermore, it is anticipated that the patient will be medically stable for discharge from the hospital within 2 midnights of admission.   Author: Norval Morton, MD 12/09/2021 11:50 AM  For on call review [email or web address].

## 2021-12-09 NOTE — Assessment & Plan Note (Addendum)
Previously prescribed CPAP, but did not tolerate it well.  This could be a possible cause of patient's excessive daytime sleepiness.

## 2021-12-09 NOTE — ED Triage Notes (Addendum)
Pt BIB EMS for a fall on thinners at home. Pt took elliquis last night at 2100 5mg  and has not taken her morning dose. Pt passed out at home today and has been feeling dizzy lately. Pt speaks vietnamese and is oreinated to baseline.VSS. Pt has hematoma on left side of head.

## 2021-12-09 NOTE — Assessment & Plan Note (Addendum)
Patient with prior right putamen and caudate stroke back in 2017 resulting in residual left-sided weakness. Continue Eliquis and pravastatin

## 2021-12-09 NOTE — Assessment & Plan Note (Addendum)
Patient notes tenderness to palpation of the lumbar spine on physical exam.  Pelvis x-ray and L-spine x-ray is negative for acute process, notable for chronic T12/L1 compression fracture.

## 2021-12-09 NOTE — Progress Notes (Signed)
Orthopedic Tech Progress Note Patient Details:  [name] 01-23-1941 [id number] Level 2 Trauma  Patient ID: [name], female   DOB: August 25, 1941, 81 y.o.   MRN: [id number]  [name] 12/09/2021, 9:02 AM

## 2021-12-09 NOTE — ED Provider Notes (Signed)
[facility] EMERGENCY DEPARTMENT  Provider Note  CSN: [id number] Arrival date & time: 12/09/21 [id number]  History Chief Complaint  Patient presents with   [facility] is a 81 y.o. female who presents after a fall.  Patient is on blood thinners for A-fib.  She states that she got lightheaded while ambulating to the bathroom today.  She fell backwards and hit the back of her head.  No loss of consciousness.  Family was present.  She reports having pain to the back of her head where she hit.  Denies any other pains at this time.  She states she did not have any preceding chest pain.   Home Medications Prior to Admission medications   Medication Sig Start Date End Date Taking? Authorizing Provider  acetaminophen (TYLENOL) 325 MG tablet Take 2 tablets (650 mg total) by mouth every 6 (six) hours as needed for mild pain, headache or fever. 11/09/20  Yes Angiulli, Lavon Paganini, PA-C  apixaban (ELIQUIS) 5 MG TABS tablet Take 1 tablet (5 mg total) by mouth 2 (two) times daily. 11/09/20  Yes Angiulli, Lavon Paganini, PA-C  Calcium Carbonate (CALCIUM-CARB 600 PO) Take 1 tablet by mouth daily. Gummy   Yes [provider]  cetirizine (ZYRTEC) 10 MG tablet Take 10 mg daily as needed by mouth (seasonal allergies).   Yes [provider]  Cholecalciferol (VITAMIN D) 50 MCG (2000 UT) tablet Take 1 tablet (2,000 Units total) by mouth daily. 11/09/20  Yes Angiulli, Lavon Paganini, PA-C  DULoxetine (CYMBALTA) 30 MG capsule Take 30 mg by mouth at bedtime. 10/14/21  Yes [provider]  fluticasone (FLONASE) 50 MCG/ACT nasal spray Place 1 spray daily as needed into both nostrils (seasonal allergies).  02/18/14  Yes [provider]  FORTEO 600 MCG/2.4ML SOPN Inject 600 mcg into the skin at bedtime. 04/12/21  Yes [provider]  HYDROcodone-acetaminophen (NORCO/VICODIN) 5-325 MG tablet Take 1 tablet by mouth every 6 (six) hours as needed for moderate pain.   Yes [provider]  hydrocortisone 2.5 % cream Apply 1 application topically daily as needed (itching). 06/17/21  Yes [provider]  lidocaine (LIDODERM) 5 % Place 2 patches onto the skin daily. Remove & Discard patch within 12 hours or as directed by MD Patient taking differently: Place 2 patches onto the skin daily as needed (back pain). Remove & Discard patch within 12 hours or as directed by MD 11/09/20  Yes Angiulli, Lavon Paganini, PA-C  metFORMIN (GLUCOPHAGE) 500 MG tablet Take 0.5 tablets (250 mg total) by mouth 2 (two) times daily with a meal. Patient taking differently: Take 500 mg by mouth 2 (two) times daily with a meal. 11/09/20  Yes Angiulli, Lavon Paganini, PA-C  metoprolol tartrate (LOPRESSOR) 25 MG tablet Take 0.5 tablets (12.5 mg total) by mouth 2 (two) times daily. 11/12/20  Yes Almyra Deforest, PA  nitroGLYCERIN (NITROSTAT) 0.4 MG SL tablet Place 1 tablet (0.4 mg total) under the tongue every 5 (five) minutes as needed for chest pain. 11/09/20  Yes Angiulli, Lavon Paganini, PA-C  polyethylene glycol (MIRALAX / GLYCOLAX) 17 g packet Take 17 g by mouth daily as needed for mild constipation. 10/20/20  Yes Aline August, MD  pravastatin (PRAVACHOL) 20 MG tablet Take 1 tablet (20 mg total) by mouth at bedtime. 11/09/20  Yes Angiulli, Lavon Paganini, PA-C  tiZANidine (ZANAFLEX) 4 MG tablet Take 2-4 mg by mouth at bedtime as needed for muscle spasms. 10/05/21  Yes [provider]  traMADol (ULTRAM) 50 MG tablet Take 1 tablet (50 mg total) by mouth every 6 (six) hours as needed for moderate pain. 11/09/20  Yes Angiulli, Lavon Paganini, PA-C  vitamin k 100 MCG tablet Take 100 mcg by mouth daily.   Yes [provider]     Allergies    Bactrim [sulfamethoxazole-trimethoprim] and Bactrim [sulfamethoxazole-trimethoprim]   Review of Systems   Review of Systems  Constitutional:  Negative for chills and fever.  HENT:  Negative for ear pain and sore throat.   Eyes:  Negative for pain and visual disturbance.   Respiratory:  Negative for cough and shortness of breath.   Cardiovascular:  Negative for chest pain and palpitations.  Gastrointestinal:  Negative for abdominal pain and vomiting.  Genitourinary:  Negative for dysuria and hematuria.  Musculoskeletal:  Negative for arthralgias and back pain.  Skin:  Negative for color change and rash.  Neurological:  Positive for syncope and headaches. Negative for seizures.  All other systems reviewed and are negative. Please see HPI for pertinent positives and negatives  Physical Exam BP 140/77    Pulse 83    Temp 98.6 F (37 C) (Oral)    Resp 14    Ht 4\' 8"  (1.422 m)    Wt 61.2 kg    SpO2 99%    BMI 30.27 kg/m   Physical Exam Vitals and nursing note reviewed.  Constitutional:      General: She is not in acute distress.    Appearance: She is well-developed.  HENT:     Head:     Comments: Hematoma to right occipital scalp Eyes:     Conjunctiva/sclera: Conjunctivae normal.  Cardiovascular:     Rate and Rhythm: Normal rate. Rhythm irregular.     Heart sounds: No murmur heard. Pulmonary:     Effort: Pulmonary effort is normal. No respiratory distress.     Breath sounds: Normal breath sounds.  Abdominal:     Palpations: Abdomen is soft.     Tenderness: There is no abdominal tenderness.  Musculoskeletal:        General: No swelling.     Cervical back: Neck supple.  Skin:    General: Skin is warm and dry.     Capillary Refill: Capillary refill takes less than 2 seconds.  Neurological:     Mental Status: She is alert.  Psychiatric:        Mood and Affect: Mood normal.    ED Results / Procedures / Treatments   EKG EKG Interpretation  Date/Time:  Thursday December 09 2021 09:12:56 EST Ventricular Rate:  90 PR Interval:    QRS Duration: 77 QT Interval:  367 QTC Calculation: 449 R Axis:   9 Text Interpretation: Atrial fibrillation Probable anteroseptal infarct, old No significant change since last tracing Confirmed by Blanchie Dessert [id number]) on 12/09/2021 10:29:14 AM  Procedures Procedures  Medications Ordered in the ED Medications  metoprolol tartrate (LOPRESSOR) tablet 12.5 mg (has no administration in time range)  sodium chloride flush (NS) 0.9 % injection 3 mL (has no administration in time range)  apixaban (ELIQUIS) tablet 5 mg (has no administration in time range)  polyethylene glycol (MIRALAX / GLYCOLAX) packet 17 g (has no administration in time range)  DULoxetine (CYMBALTA) DR capsule 30 mg (has no administration in time range)  fluticasone (FLONASE) 50 MCG/ACT nasal spray 1 spray (has no administration in time range)  hydrocortisone 2.5 % cream 1 application (has no administration in time range)  lidocaine (LIDODERM) 5 % 2 patch (has no administration in time range)  acetaminophen (TYLENOL) tablet 650 mg (has no administration in time range)    Or  acetaminophen (TYLENOL) suppository 650 mg (has no administration in time range)  albuterol (PROVENTIL) (2.5 MG/3ML) 0.083% nebulizer solution 2.5 mg (has no administration in time range)  lactated ringers bolus 1,000 mL (1,000 mLs Intravenous New Bag/Given 12/09/21 1043)     ED Course   Clinical Course as of 12/09/21 1230  Thu Dec 09, 2021  0910 Reviewed images and labs. CXR with questionable atelectasis, XR pelvis negative. CT Head and C spine both negative.  [AB]    Clinical Course User Index [AB] Jacelyn Pi, MD     MDM   This patient presents to the ED for concern of syncope, this involves an extensive number of treatment options, and is a complaint that carries with it a high risk of complications and morbidity.  The differential diagnosis includes cardiac syncope, vasovagal, orthostatic. Patients presentation is complicated by their history of AFIB on eliquis   Additional history obtained: Additional history obtained from family and EMS  Records reviewed previous admission documents, Care Everywhere/External Records, and Primary Care  Documents  Lab Tests: I Ordered, and personally interpreted labs.  The pertinent results include: unremarkable.   Imaging Studies ordered: I ordered imaging studies including CT scan head and cspine, and X-ray chest and pelvis   I independently visualized and interpreted imaging which showed no acute findings I agree with the radiologist interpretation  EKG (personally reviewed and interpreted): no STEMI. AFIB but no changes from previous   Medical Decision Making: Patient is an 81 year old female presented after a fall.  Patient at home and appeared to have a syncopal event per family.  She had been up standing with them for a while and was feeling fine.  Went to the bathroom and fell prior to getting to the toilet.  Fell backwards and hit her head.  Denies loss consciousness.  Continues to feel lightheaded.  Work-up here was grossly unremarkable, with no obvious findings to explain the event. Last cardiac workup was 1 year ago. Given unclear etiology of syncope, plan for admission to hospital for further evaluation.  Complexity of problems addressed: Patients presentation is most consistent with  acute presentation with potential threat to life or bodily function  Disposition: After consideration of the diagnostic results and the patients response to treatment,  I feel that the patent would benefit from admission to the hospital .   Patient seen in conjunction with my attending, Dr. Maryan Rued.    Final Clinical Impression(s) / ED Diagnoses Final diagnoses:  Fall, initial encounter  Syncope, unspecified syncope type    Rx / DC Orders ED Discharge Orders     None         Jacelyn Pi, MD 12/09/21 1230    Blanchie Dessert, MD 12/10/21 [phone]

## 2021-12-09 NOTE — ED Notes (Signed)
Orthostatic vitals completed pt has dizziness.

## 2021-12-09 NOTE — Assessment & Plan Note (Addendum)
Chronic.  Patient presents with hemoglobin 11.5 with MCV 104.  Review of records notes that is chronically been elevated.  Vitamin B12 450, within normal limits.

## 2021-12-09 NOTE — ED Notes (Signed)
Trauma start---0801

## 2021-12-10 DIAGNOSIS — G4733 Obstructive sleep apnea (adult) (pediatric): Secondary | ICD-10-CM | POA: Diagnosis not present

## 2021-12-10 DIAGNOSIS — E1142 Type 2 diabetes mellitus with diabetic polyneuropathy: Secondary | ICD-10-CM | POA: Diagnosis not present

## 2021-12-10 DIAGNOSIS — R55 Syncope and collapse: Secondary | ICD-10-CM | POA: Diagnosis not present

## 2021-12-10 DIAGNOSIS — I482 Chronic atrial fibrillation, unspecified: Secondary | ICD-10-CM | POA: Diagnosis not present

## 2021-12-10 DIAGNOSIS — Y92009 Unspecified place in unspecified non-institutional (private) residence as the place of occurrence of the external cause: Secondary | ICD-10-CM | POA: Diagnosis not present

## 2021-12-10 LAB — BASIC METABOLIC PANEL
Anion gap: 10 (ref 5–15)
BUN: 17 mg/dL (ref 8–23)
CO2: 27 mmol/L (ref 22–32)
Calcium: 9.5 mg/dL (ref 8.9–10.3)
Chloride: 103 mmol/L (ref 98–111)
Creatinine, Ser: 0.68 mg/dL (ref 0.44–1.00)
GFR, Estimated: >60 mL/min (ref >60)
Glucose, Bld: 125 mg/dL — ABNORMAL HIGH (ref 70–99)
Potassium: 4.5 mmol/L (ref 3.5–5.1)
Sodium: 140 mmol/L (ref 135–145)

## 2021-12-10 LAB — VITAMIN B12: Vitamin B-12: 450 pg/mL (ref 180–914)

## 2021-12-10 LAB — GLUCOSE, CAPILLARY
Glucose-Capillary: 156 mg/dL — ABNORMAL HIGH (ref 70–99)
Glucose-Capillary: 185 mg/dL — ABNORMAL HIGH (ref 70–99)

## 2021-12-10 LAB — CBC
HCT: 35.8 % — ABNORMAL LOW (ref 36.0–46.0)
Hemoglobin: 12 g/dL (ref 12.0–15.0)
MCH: 34 pg (ref 26.0–34.0)
MCHC: 33.5 g/dL (ref 30.0–36.0)
MCV: 101.4 fL — ABNORMAL HIGH (ref 80.0–100.0)
Platelets: 327 10*3/uL (ref 150–400)
RBC: 3.53 MIL/uL — ABNORMAL LOW (ref 3.87–5.11)
RDW: 12.4 % (ref 11.5–15.5)
WBC: 8.5 10*3/uL (ref 4.0–10.5)
nRBC: 0 % (ref 0.0–0.2)

## 2021-12-10 LAB — TSH: TSH: 1.39 u[IU]/mL (ref 0.350–4.500)

## 2021-12-10 MED ORDER — FORTEO 600 MCG/2.4ML ~~LOC~~ SOPN: 20.0000 ug | PEN_INJECTOR | Freq: Every day | SUBCUTANEOUS | Status: DC

## 2021-12-10 MED ORDER — TRIAMCINOLONE ACETONIDE 0.1 % EX CREA
TOPICAL_CREAM | Freq: Two times a day (BID) | CUTANEOUS | Status: DC | PRN
  Filled 2021-12-10: qty 15

## 2021-12-10 NOTE — Hospital Course (Signed)
[name]  is a 81 y.o. female with medical history significant of chronic atrial fibrillation on anticoagulation, diabetes mellitus type 2, CVA, subdural hematoma, frequent falls, GERD, and OSA not on CPAP present after having a fall at home.  History is obtained with use of interpreter services and talks with patient's daughter over the phone.  Normally patient gets around with use of a walker and was using it when she fell this morning.  She fell and landed on her buttocks.  Denies any loss of consciousness or significant trauma to her head, but her daughter reported that she complained of dizziness.  Ever since patient had a stroke back in 2017 she has had residual left-sided weakness.  However, her daughter notes that he recently she has had progressive weakness of the bilateral lower extremities.  Whenever the patient is standing up from a sitting it seems like she has been having a more difficult time and could fall backwards.  Patient denies any recent fever, chest pain, palpitations, cough, nausea, vomiting, diarrhea, blood in stool/urine.  Her daughter also brings up the fact that she has been having urinary frequency especially at night for which she is concerned that she may fall and has had increased daytime sleepiness.  Family notes that they have been limiting her pain medications due to fear of increasing risk of falls.   Patient came into the emergency department as a level 2 trauma.  She was noted to be afebrile, in atrial fibrillation with rates controlled, blood pressures 129/81-156/91, and all other vital signs maintained.  Labs noted hemoglobin 11.5 with elevated MCV of 104.1, INR 1.2, APTT 52, and all other labs relatively stable.Chest x-ray noted low lung volumes with mild pulmonary vascular congestion and left lobe opacity reflecting possible atelectasis or pneumonia.  X-rays of the pelvis noted no acute fractures and chronic bilateral superior and inferior rami fractures.  CT scan of the  head and cervical spine did not note any acute intracranial abnormality. Patient was given 1 L normal saline IV fluid.  Hospital service consulted for further evaluation management.

## 2021-12-10 NOTE — Evaluation (Signed)
Physical Therapy Evaluation Patient Details Name: [name] MRN: [id number] DOB: 15-Dec-1940 Today's Date: 12/10/2021  History of Present Illness  81 yo admitted 2/23 after fall at home with increasing weakness. PMhx: GERD, AFib, OSA, HLD, DM, SDH, CVA with left hemiparesis, frequent falls  Clinical Impression  Pt pleasant with daughter present to interpret. Per family pt received therapy at SNF then HHPT after her CVA with improvement in safety and function but over last months has had a gradual decline in mobility and balance in increased falls. Pt with decreased memory and cognition requiring supervision for all mobility for safety cues. Pt with decreased strength and falls who will benefit from acute therapy to maximize mobility and safety.       Recommendations for follow up therapy are one component of a multi-disciplinary discharge planning process, led by the attending physician.  Recommendations may be updated based on patient status, additional functional criteria and insurance authorization.  Follow Up Recommendations Home health PT    Assistance Recommended at Discharge Frequent or constant Supervision/Assistance  Patient can return home with the following  A little help with walking and/or transfers;A little help with bathing/dressing/bathroom;Assistance with cooking/housework;Direct supervision/assist for financial management;Assist for transportation;Help with stairs or ramp for entrance;Direct supervision/assist for medications management    Equipment Recommendations None recommended by PT  Recommendations for Other Services       Functional Status Assessment Patient has had a recent decline in their functional status and demonstrates the ability to make significant improvements in function in a reasonable and predictable amount of time.     Precautions / Restrictions Precautions Precautions: Fall      Mobility  Bed Mobility Overal bed mobility: Modified Independent                   Transfers Overall transfer level: Needs assistance   Transfers: Sit to/from Stand Sit to Stand: Min guard           General transfer comment: minguard with cues for scooting forward, placing feet under her and hand placement with repeated trials x 3    Ambulation/Gait Ambulation/Gait assistance: Min guard Gait Distance (Feet): 400 Feet Assistive device: Rolling walker (2 wheels) Gait Pattern/deviations: Step-through pattern, Decreased stride length   Gait velocity interpretation: <1.8 ft/sec, indicate of risk for recurrent falls   General Gait Details: mod cues for proximity to RW and direction. pt with slow gait with need for redirection and awareness of RW use  Stairs Stairs: Yes Stairs assistance: Min assist Stair Management: One rail Left, Step to pattern Number of Stairs: 2 General stair comments: pt with step to pattern with min assist to rise onto step and safely descend. bil hands on rail for ascent  Wheelchair Mobility    Modified Rankin (Stroke Patients Only)       Balance Overall balance assessment: History of Falls, Needs assistance Sitting-balance support: No upper extremity supported, Feet supported Sitting balance-Leahy Scale: Fair     Standing balance support: Bilateral upper extremity supported Standing balance-Leahy Scale: Poor Standing balance comment: reliance on bil UE for standing, gait and stairs                             Pertinent Vitals/Pain Pain Assessment Pain Assessment: No/denies pain    Home Living Family/patient expects to be discharged to:: Private residence Living Arrangements: Children Available Help at Discharge: Family;Available 24 hours/day Type of Home: House Home Access: Stairs to enter  Entrance Stairs-Rails: Left Entrance Stairs-Number of Steps: 2   Home Layout: Two level;Able to live on main level with bedroom/bathroom Home Equipment: Tub bench;Rolling Walker (2  wheels);BSC/3in1;Other (comment);Toilet riser Additional Comments: rail at bed    Prior Function Prior Level of Function : Needs assist  Cognitive Assist : ADLs (cognitive)   ADLs (Cognitive):  (family manages medicines and finances) Physical Assist : ADLs (physical)   ADLs (physical): Bathing;Dressing Mobility Comments: typically uses RW ADLs Comments: family does IADL, Pt manages her own diapers and will heat up food and sometimes do dishes - she sits to bathe with supervision for safety     Hand Dominance        Extremity/Trunk Assessment   Upper Extremity Assessment Upper Extremity Assessment: Generalized weakness (LUE hemiparesis at baseline)    Lower Extremity Assessment Lower Extremity Assessment: Generalized weakness (LLE hemiparesis at baseline)    Cervical / Trunk Assessment Cervical / Trunk Assessment: Other exceptions Cervical / Trunk Exceptions: rounded shoulders  Communication   Communication: Prefers language other than [place];Other (comment) (vietnamese with daughter interpreting per pt request)  Cognition Arousal/Alertness: Awake/alert Behavior During Therapy: WFL for tasks assessed/performed Overall Cognitive Status: Impaired/Different from baseline Area of Impairment: Memory, Orientation, Safety/judgement                 Orientation Level: Time   Memory: Decreased short-term memory   Safety/Judgement: Decreased awareness of deficits, Decreased awareness of safety     General Comments: pt with decreased recall of precautions and cues with need for repetition. Daughter reports at baseline she is not oriented to time and needs repetition due to declining memory        General Comments      Exercises     Assessment/Plan    PT Assessment Patient needs continued PT services  PT Problem List Decreased cognition;Decreased activity tolerance;Decreased balance;Decreased knowledge of use of DME;Decreased safety awareness;Decreased  mobility;Decreased strength       PT Treatment Interventions DME instruction;Gait training;Stair training;Functional mobility training;Therapeutic activities;Patient/family education;Cognitive remediation;Balance training;Therapeutic exercise    PT Goals (Current goals can be found in the Care Plan section)  Acute Rehab PT Goals Patient Stated Goal: return home and cook (pt no longer cooks per family ) PT Goal Formulation: With patient/family Time For Goal Achievement: 12/24/21 Potential to Achieve Goals: Fair    Frequency Min 3X/week     Co-evaluation               AM-PAC PT "6 Clicks" Mobility  Outcome Measure Help needed turning from your back to your side while in a flat bed without using bedrails?: None Help needed moving from lying on your back to sitting on the side of a flat bed without using bedrails?: None Help needed moving to and from a bed to a chair (including a wheelchair)?: A Little Help needed standing up from a chair using your arms (e.g., wheelchair or bedside chair)?: A Little Help needed to walk in hospital room?: A Little Help needed climbing 3-5 steps with a railing? : A Little 6 Click Score: 20    End of Session   Activity Tolerance: Patient tolerated treatment well Patient left: in chair;with call bell/phone within reach;with chair alarm set;with family/visitor present Nurse Communication: Mobility status PT Visit Diagnosis: Other abnormalities of gait and mobility (R26.89)    Time: 1001-1029 PT Time Calculation (min) (ACUTE ONLY): 28 min   Charges:   PT Evaluation $PT Eval Moderate Complexity: 1 Mod PT Treatments $Gait Training:  8-22 mins        Bayard Males, PT Acute Rehabilitation Services Pager: [phone] Office: [place] 12/10/2021, 12:16 PM

## 2021-12-10 NOTE — Plan of Care (Signed)
°  Problem: Education: °Goal: Knowledge of General Education information will improve °Description: Including pain rating scale, medication(s)/side effects and non-pharmacologic comfort measures °Outcome: Progressing °  °Problem: Clinical Measurements: °Goal: Diagnostic test results will improve °Outcome: Progressing °  °Problem: Pain Managment: °Goal: General experience of comfort will improve °Outcome: Progressing °  °Problem: Safety: °Goal: Ability to remain free from injury will improve °Outcome: Progressing °  °

## 2021-12-10 NOTE — Care Management Obs Status (Signed)
MEDICARE OBSERVATION STATUS NOTIFICATION   Patient Details  Name: [name] MRN: [id number] Date of Birth: [birth year]   Medicare Observation Status Notification Given:  Yes    Gala Lewandowsky, RN 12/10/2021, 4:16 PM

## 2021-12-10 NOTE — Discharge Summary (Signed)
Physician Discharge Summary  [place] DUK:[id number] DOB: Aug 30, 1941 DOA: 12/09/2021  PCP: [name], MD  Admit date: 12/09/2021 Discharge date: 12/10/2021  Admitted From: Home Disposition: Home  Recommendations for Outpatient Follow-up:  Follow up with PCP in 1-2 weeks Recommend follow-up with cardiologist regarding continued anticoagulation in the setting of recurrent falls  Home Health: Physical therapy Equipment/Devices: None  Discharge Condition: Stable CODE STATUS: Full code Diet recommendation:   History of present illness:  [name]  is a 81 y.o. female with medical history significant of chronic atrial fibrillation on anticoagulation, diabetes mellitus type 2, CVA, subdural hematoma, frequent falls, GERD, and OSA not on CPAP present after having a fall at home.  History is obtained with use of interpreter services and talks with patient's daughter over the phone.  Normally patient gets around with use of a walker and was using it when she fell this morning.  She fell and landed on her buttocks.  Denies any loss of consciousness or significant trauma to her head, but her daughter reported that she complained of dizziness.  Ever since patient had a stroke back in 2017 she has had residual left-sided weakness.  However, her daughter notes that he recently she has had progressive weakness of the bilateral lower extremities.  Whenever the patient is standing up from a sitting it seems like she has been having a more difficult time and could fall backwards.  Patient denies any recent fever, chest pain, palpitations, cough, nausea, vomiting, diarrhea, blood in stool/urine.  Her daughter also brings up the fact that she has been having urinary frequency especially at night for which she is concerned that she may fall and has had increased daytime sleepiness.  Family notes that they have been limiting her pain medications due to fear of increasing risk of falls.   Patient came into the  emergency department as a level 2 trauma.  She was noted to be afebrile, in atrial fibrillation with rates controlled, blood pressures 129/81-156/91, and all other vital signs maintained.  Labs noted hemoglobin 11.5 with elevated MCV of 104.1, INR 1.2, APTT 52, and all other labs relatively stable.Chest x-ray noted low lung volumes with mild pulmonary vascular congestion and left lobe opacity reflecting possible atelectasis or pneumonia.  X-rays of the pelvis noted no acute fractures and chronic bilateral superior and inferior rami fractures.  CT scan of the head and cervical spine did not note any acute intracranial abnormality. Patient was given 1 L normal saline IV fluid.  Hospital service consulted for further evaluation management.   Hospital course:  Assessment and Plan: * Fall at home, initial encounter Patient presents after a fall at home today where she was using a walker.  No reports of loss of consciousness or trauma to the head.  Daughter states that the patient has seemed to have increased weakness along with having difficulty standing and seems to want to fall backwards.  CT imaging the brain was negative for any acute abnormality, but did note prior stroke.  Orthostatic vital signs within normal limits.  MR brain without contrast with no acute intracranial abnormality, but notable for moderate cerebral white matter chronic small vessel disease, chronic infarct right corona radiata/basal ganglia.  Patient was seen by PT and OT with recommendation of home health PT.  History of CVA with residual deficit Patient with prior right putamen and caudate stroke back in 2017 resulting in residual left-sided weakness. Continue Eliquis and pravastatin  Back pain Patient notes tenderness to palpation of  the lumbar spine on physical exam.  Pelvis x-ray and L-spine x-ray is negative for acute process, notable for chronic T12/L1 compression fracture.   Macrocytic anemia Chronic.  Patient presents  with hemoglobin 11.5 with MCV 104.  Review of records notes that is chronically been elevated.  Vitamin B12 450, within normal limits.  Type 2 diabetes mellitus with peripheral neuropathy ([facility])- (present on admission) On admission glucose was 139.  Home medication regimen includes metformin 500 mg twice daily.  Last available hemoglobin A1c was 6.4 10/18/2020.   OSA (obstructive sleep apnea)- (present on admission) Previously prescribed CPAP, but did not tolerate it well.  This could be a possible cause of patient's excessive daytime sleepiness.  Chronic atrial fibrillation ([facility])- (present on admission) At this time patient appears to be relatively rate controlled.  Continue metoprolol and Eliquis. May need to rediscuss risks and benefits with the patient's cardiologist if patient continues to have frequent falls       Discharge Diagnoses:  Principal Problem:   Fall at home, initial encounter Active Problems:   Chronic atrial fibrillation (HCC)   OSA (obstructive sleep apnea)   Type 2 diabetes mellitus with peripheral neuropathy (HCC)   Macrocytic anemia   Back pain   History of CVA with residual deficit    Discharge Instructions  Discharge Instructions     Call MD for:  difficulty breathing, headache or visual disturbances   Complete by: As directed    Call MD for:  extreme fatigue   Complete by: As directed    Call MD for:  persistant dizziness or light-headedness   Complete by: As directed    Call MD for:  persistant nausea and vomiting   Complete by: As directed    Call MD for:  severe uncontrolled pain   Complete by: As directed    Call MD for:  temperature >100.4   Complete by: As directed    Diet - low sodium heart healthy   Complete by: As directed    Increase activity slowly   Complete by: As directed       Allergies as of 12/10/2021       Reactions   Bactrim [sulfamethoxazole-trimethoprim] Cough   And flushing of face and conjunctivitis.    Bactrim  [sulfamethoxazole-trimethoprim]         Medication List     TAKE these medications    acetaminophen 325 MG tablet Commonly known as: TYLENOL Take 2 tablets (650 mg total) by mouth every 6 (six) hours as needed for mild pain, headache or fever.   apixaban 5 MG Tabs tablet Commonly known as: ELIQUIS Take 1 tablet (5 mg total) by mouth 2 (two) times daily.   CALCIUM-CARB 600 PO Take 1 tablet by mouth daily. Gummy   cetirizine 10 MG tablet Commonly known as: ZYRTEC Take 10 mg daily as needed by mouth (seasonal allergies).   DULoxetine 30 MG capsule Commonly known as: CYMBALTA Take 30 mg by mouth at bedtime.   fluticasone 50 MCG/ACT nasal spray Commonly known as: FLONASE Place 1 spray daily as needed into both nostrils (seasonal allergies).   Forteo 600 MCG/2.4ML Sopn Generic drug: Teriparatide (Recombinant) Inject 20 mcg into the skin at bedtime. What changed: how much to take   HYDROcodone-acetaminophen 5-325 MG tablet Commonly known as: NORCO/VICODIN Take 1 tablet by mouth every 6 (six) hours as needed for moderate pain.   hydrocortisone 2.5 % cream Apply 1 application topically daily as needed (itching).   lidocaine 5 % Commonly  known as: LIDODERM Place 2 patches onto the skin daily. Remove & Discard patch within 12 hours or as directed by MD What changed:  when to take this reasons to take this   metFORMIN 500 MG tablet Commonly known as: GLUCOPHAGE Take 0.5 tablets (250 mg total) by mouth 2 (two) times daily with a meal. What changed: how much to take   metoprolol tartrate 25 MG tablet Commonly known as: LOPRESSOR Take 0.5 tablets (12.5 mg total) by mouth 2 (two) times daily.   nitroGLYCERIN 0.4 MG SL tablet Commonly known as: NITROSTAT Place 1 tablet (0.4 mg total) under the tongue every 5 (five) minutes as needed for chest pain.   polyethylene glycol 17 g packet Commonly known as: MIRALAX / GLYCOLAX Take 17 g by mouth daily as needed for mild  constipation.   pravastatin 20 MG tablet Commonly known as: PRAVACHOL Take 1 tablet (20 mg total) by mouth at bedtime.   tiZANidine 4 MG tablet Commonly known as: ZANAFLEX Take 2-4 mg by mouth at bedtime as needed for muscle spasms.   traMADol 50 MG tablet Commonly known as: ULTRAM Take 1 tablet (50 mg total) by mouth every 6 (six) hours as needed for moderate pain.   Vitamin D 50 MCG (2000 UT) tablet Take 1 tablet (2,000 Units total) by mouth daily.   vitamin k 100 MCG tablet Take 100 mcg by mouth daily.        Follow-up Information     Haydee Salter, MD. Schedule an appointment as soon as possible for a visit in 1 week(s).   Specialty: Internal Medicine Contact information: [place] [address] [phone]         Lelon Perla, MD .   Specialty: Cardiology Contact information: [address] [place] [address] [phone]                Allergies  Allergen Reactions   Bactrim [Sulfamethoxazole-Trimethoprim] Cough    And flushing of face and conjunctivitis.    Bactrim [Sulfamethoxazole-Trimethoprim]     Consultations: None   Procedures/Studies: DG Lumbar Spine 2-3 Views  Result Date: 12/09/2021 CLINICAL DATA:  Low back pain for days. EXAM: LUMBAR SPINE - 2-3 VIEW COMPARISON:  Abdominal CT reformats 09/03/2017 FINDINGS: There are 5 non-rib-bearing lumbar vertebra. The bones are under mineralized. Chronic T12 compression fracture with focal kyphosis, unchanged in appearance from 2018 exam. There is also a slight wedging of L1 vertebral body that is chronic. No acute lumbar compression deformity. No evidence of acute fracture. There is lower lumbar facet hypertrophy at L4-L5 and L5-S1. The disc spaces are preserved. IMPRESSION: 1. No acute abnormality of the lumbar spine. 2. Chronic T12 and L1 compression fractures, unchanged from 2018. 3. Osteopenia/osteoporosis. 4. Lower lumbar facet hypertrophy.  Electronically Signed   By: Keith Rake M.D.   On: 12/09/2021 19:09   CT Head Wo Contrast  Result Date: 12/09/2021 CLINICAL DATA:  Head trauma, fell at home, coagulopathy, on Eliquis, syncopal episode, has been feeling dizzy lately. History atrial fibrillation, type II diabetes mellitus, essential hypertension EXAM: CT HEAD WITHOUT CONTRAST TECHNIQUE: Contiguous axial images were obtained from the base of the skull through the vertex without intravenous contrast. RADIATION DOSE REDUCTION: This exam was performed according to the departmental dose-optimization program which includes automated exposure control, adjustment of the mA and/or kV according to patient size and/or use of iterative reconstruction technique. COMPARISON:  07/01/2021 FINDINGS: Brain: Generalized atrophy. Normal ventricular morphology. No midline  shift or mass effect. Small vessel chronic ischemic changes of deep cerebral white matter. Benign basal ganglia calcifications bilaterally unchanged. Old RIGHT basal ganglia infarct. No intracranial hemorrhage, mass lesion, or evidence of acute infarction. No extra-axial fluid collections. Vascular: No hyperdense vessels. Atherosclerotic calcification of internal carotid arteries at skull base. Skull: Intact.  Small posterior RIGHT parietal scalp hematoma. Sinuses/Orbits: Clear Other: N/A IMPRESSION: Atrophy with small vessel chronic ischemic changes of deep cerebral white matter. Old RIGHT basal ganglia infarct. No acute intracranial abnormalities. Electronically Signed   By: Lavonia Dana M.D.   On: 12/09/2021 09:02   CT Cervical Spine Wo Contrast  Result Date: 12/09/2021 CLINICAL DATA:  Neck trauma.  Fall. EXAM: CT CERVICAL SPINE WITHOUT CONTRAST TECHNIQUE: Multidetector CT imaging of the cervical spine was performed without intravenous contrast. Multiplanar CT image reconstructions were also generated. RADIATION DOSE REDUCTION: This exam was performed according to the departmental  dose-optimization program which includes automated exposure control, adjustment of the mA and/or kV according to patient size and/or use of iterative reconstruction technique. COMPARISON:  Cervical spine CT 07/01/2021 FINDINGS: There is moderate motion artifact through the skull base and C1-C3. Alignment: Chronic straightening of the normal lumbar lordosis. No gross acute subluxation within limitations of motion artifact. Skull base and vertebrae: No acute fracture is identified, however assessment in the upper cervical spine is limited by motion artifact, particularly for detection of a nondisplaced fracture. No destructive osseous lesion. Soft tissues and spinal canal: No prevertebral fluid or swelling. No visible canal hematoma. Disc levels: Minimal cervical spondylosis, less than expected for age. Upper chest: Motion artifact and chronic mosaic attenuation in the lung apices, right greater than left. Other: Mild calcific atherosclerosis at the right greater than left carotid bifurcations. IMPRESSION: No acute osseous abnormality identified within limitations of motion artifact. Electronically Signed   By: Logan Bores M.D.   On: 12/09/2021 09:06   MR BRAIN WO CONTRAST  Result Date: 12/09/2021 CLINICAL DATA:  Provided history: Stroke, follow-up. EXAM: MRI HEAD WITHOUT CONTRAST TECHNIQUE: Multiplanar, multiecho pulse sequences of the brain and surrounding structures were obtained without intravenous contrast. COMPARISON:  Prior head CT examinations 12/09/2021 and earlier. Brain MRI 10/16/2016. FINDINGS: Brain: Moderate generalized cerebral atrophy. Comparatively mild cerebellar atrophy. Redemonstrated chronic infarct within the right corona radiata/basal ganglia. Associated mild chronic hemosiderin deposition at this site. Background moderate multifocal T2 FLAIR hyperintense signal abnormality within the cerebral white matter, nonspecific but compatible with chronic small vessel ischemic disease. Mild chronic  small vessel ischemic changes are also present within the pons. Redemonstrated subcentimeter T2 hyperintense focus within the left cerebellar white matter, likely reflecting a prominent perivascular space. There is no acute infarct. No evidence of an intracranial mass. No extra-axial fluid collection. No midline shift. Vascular: Maintained flow voids within the proximal large arterial vessels. Skull and upper cervical spine: No focal suspicious marrow lesion. Sinuses/Orbits: Visualized orbits show no acute finding. Bilateral ocular lens replacements. Trace mucosal thickening within the bilateral ethmoid sinuses. IMPRESSION: No evidence of acute intracranial abnormality. Known chronic infarct within the right corona radiata/basal ganglia. Background moderate cerebral white matter chronic small vessel ischemic disease, progressed from the prior brain MRI of 10/16/2016. Mild chronic small-vessel ischemic changes are also present within the pons. Moderate generalized cerebral atrophy. Comparatively mild cerebellar atrophy. Electronically Signed   By: Kellie Simmering D.O.   On: 12/09/2021 20:07   DG Pelvis Portable  Result Date: 12/09/2021 CLINICAL DATA:  Fall EXAM: PORTABLE PELVIS 1-2 VIEWS COMPARISON:  10/17/2020, 10/27/2020 FINDINGS:  Redemonstration of bilateral superior and inferior pubic rami fractures with interval healing. No evidence of acute fracture or pelvic diastasis. No evidence of hip fracture or dislocation. IMPRESSION: 1. No acute fracture or dislocation of the pelvis. 2. Chronic bilateral superior and inferior pubic rami fractures. Electronically Signed   By: Davina Poke D.O.   On: 12/09/2021 08:37   DG Chest Portable 1 View  Result Date: 12/09/2021 CLINICAL DATA:  Fall. EXAM: PORTABLE CHEST 1 VIEW COMPARISON:  Chest radiograph 10/19/2020. Right rib radiographs 01/13/2020. FINDINGS: The cardiac silhouette remains mildly enlarged. Lung volumes are low, though improved from the prior chest  radiograph, with persistent mild pulmonary vascular congestion. There is asymmetric, mild opacity in the retrocardiac left lower lobe. No sizable pleural effusion or pneumothorax is identified. Lateral right fourth and fifth rib fractures are chronic based on prior studies. IMPRESSION: Low lung volumes with mild pulmonary vascular congestion and left lower lobe opacity which may reflect atelectasis or pneumonia. Electronically Signed   By: Logan Bores M.D.   On: 12/09/2021 08:42     Subjective: Patient seen examined at bedside, resting comfortably.  Daughter present and assists with translation.  Discussed x-ray and MRI findings that were unrevealing.  Was seen by PT and OT with recommendation of home health PT.  Ready for discharge, no other complaints or concerns at this time.  Patient denies headache, no dizziness, no chest pain, no shortness of breath, no abdominal pain, no focal weakness, no fever/chills/night sweats, no nausea/vomiting/diarrhea.  No acute events overnight per nurse staff.  Discharge Exam: Vitals:   12/09/21 2340 12/10/21 0349  BP: 107/65 (!) 157/67  Pulse: 83 82  Resp:    Temp: 98.3 F (36.8 C) 98.1 F (36.7 C)  SpO2: 95% 97%   Vitals:   12/09/21 1609 12/09/21 2049 12/09/21 2340 12/10/21 0349  BP: (!) 157/90 (!) 141/75 107/65 (!) 157/67  Pulse: 93  83 82  Resp: 18     Temp: 98.2 F (36.8 C) 98.5 F (36.9 C) 98.3 F (36.8 C) 98.1 F (36.7 C)  TempSrc: Oral Oral Oral Oral  SpO2: 94%  95% 97%  Weight:      Height:        Physical Exam: GEN: NAD, alert and oriented x 3, elderly in appearance HEENT: NCAT, PERRL, EOMI, sclera clear, MMM PULM: CTAB w/o wheezes/crackles, normal respiratory effort, on room air CV: RRR w/o M/G/R GI: abd soft, NTND, NABS, no R/G/M MSK: no peripheral edema, muscle strength globally intact 5/5 bilateral upper/lower extremities NEURO: CN II-XII intact, no focal deficits, sensation to light touch intact PSYCH: normal  mood/affect Integumentary: Several areas of hyperpigmented lesions that are chronic per patient/daughter and treated with topical steroids    The results of significant diagnostics from this hospitalization (including imaging, microbiology, ancillary and laboratory) are listed below for reference.     Microbiology: Recent Results (from the past 240 hour(s))  Resp Panel by RT-PCR (Flu A&B, Covid) Nasopharyngeal Swab     Status: None   Collection Time: 12/09/21  2:18 PM   Specimen: Nasopharyngeal Swab; Nasopharyngeal(NP) swabs in vial transport medium  Result Value Ref Range Status   SARS Coronavirus 2 by RT PCR NEGATIVE NEGATIVE Final    Comment: (NOTE) SARS-CoV-2 target nucleic acids are NOT DETECTED.  The SARS-CoV-2 RNA is generally detectable in upper respiratory specimens during the acute phase of infection. The lowest concentration of SARS-CoV-2 viral copies this assay can detect is 138 copies/mL. A negative result does not preclude  SARS-Cov-2 infection and should not be used as the sole basis for treatment or other patient management decisions. A negative result may occur with  improper specimen collection/handling, submission of specimen other than nasopharyngeal swab, presence of viral mutation(s) within the areas targeted by this assay, and inadequate number of viral copies(<138 copies/mL). A negative result must be combined with clinical observations, patient history, and epidemiological information. The expected result is Negative.  Fact Sheet for Patients:  EntrepreneurPulse.com.au  Fact Sheet for Healthcare Providers:  IncredibleEmployment.be  This test is no t yet approved or cleared by the [place] FDA and  has been authorized for detection and/or diagnosis of SARS-CoV-2 by FDA under an Emergency Use Authorization (EUA). This EUA will remain  in effect (meaning this test can be used) for the duration of the COVID-19  declaration under Section 564(b)(1) of the Act, 21 U.S.C.section 360bbb-3(b)(1), unless the authorization is terminated  or revoked sooner.       Influenza A by PCR NEGATIVE NEGATIVE Final   Influenza B by PCR NEGATIVE NEGATIVE Final    Comment: (NOTE) The Xpert Xpress SARS-CoV-2/FLU/RSV plus assay is intended as an aid in the diagnosis of influenza from Nasopharyngeal swab specimens and should not be used as a sole basis for treatment. Nasal washings and aspirates are unacceptable for Xpert Xpress SARS-CoV-2/FLU/RSV testing.  Fact Sheet for Patients: EntrepreneurPulse.com.au  Fact Sheet for Healthcare Providers: IncredibleEmployment.be  This test is not yet approved or cleared by the [place] FDA and has been authorized for detection and/or diagnosis of SARS-CoV-2 by FDA under an Emergency Use Authorization (EUA). This EUA will remain in effect (meaning this test can be used) for the duration of the COVID-19 declaration under Section 564(b)(1) of the Act, 21 U.S.C. section 360bbb-3(b)(1), unless the authorization is terminated or revoked.  Performed at [facility], [place] [address], [place], [place] [address]      Labs: BNP (last 3 results) No results for input(s): BNP in the last 8760 hours. Basic Metabolic Panel: Recent Labs  Lab 12/09/21 0837 12/10/21 0254  NA 142 140  K 4.3 4.5  CL 104 103  CO2 30 27  GLUCOSE 139* 125*  BUN 14 17  CREATININE 0.76 0.68  CALCIUM 9.9 9.5  MG 1.9  --    Liver Function Tests: Recent Labs  Lab 12/09/21 0837  AST 15  ALT 11  ALKPHOS 62  BILITOT 0.4  PROT 6.5  ALBUMIN 4.0   No results for input(s): LIPASE, AMYLASE in the last 168 hours. No results for input(s): AMMONIA in the last 168 hours. CBC: Recent Labs  Lab 12/09/21 0837 12/10/21 0254  WBC 6.4 8.5  NEUTROABS 3.3  --   HGB 11.5* 12.0  HCT 35.3* 35.8*  MCV 104.1* 101.4*  PLT 328 327   Cardiac Enzymes: No  results for input(s): CKTOTAL, CKMB, CKMBINDEX, TROPONINI in the last 168 hours. BNP: Invalid input(s): POCBNP CBG: Recent Labs  Lab 12/09/21 2056 12/10/21 0735 12/10/21 1138  GLUCAP 118* 156* 185*   D-Dimer No results for input(s): DDIMER in the last 72 hours. Hgb A1c No results for input(s): HGBA1C in the last 72 hours. Lipid Profile No results for input(s): CHOL, HDL, LDLCALC, TRIG, CHOLHDL, LDLDIRECT in the last 72 hours. Thyroid function studies Recent Labs    12/10/21 0254  TSH 1.390   Anemia work up Recent Labs    12/10/21 0254  VITAMINB12 450   Urinalysis    Component Value Date/Time   COLORURINE  STRAW (A) 12/09/2021 2140   APPEARANCEUR CLEAR 12/09/2021 2140   LABSPEC 1.008 12/09/2021 2140   PHURINE 8.0 12/09/2021 2140   GLUCOSEU NEGATIVE 12/09/2021 2140   HGBUR SMALL (A) 12/09/2021 2140   BILIRUBINUR NEGATIVE 12/09/2021 2140   KETONESUR NEGATIVE 12/09/2021 2140   PROTEINUR NEGATIVE 12/09/2021 2140   UROBILINOGEN 0.2 02/11/2017 1601   NITRITE NEGATIVE 12/09/2021 2140   LEUKOCYTESUR TRACE (A) 12/09/2021 2140   Sepsis Labs Invalid input(s): PROCALCITONIN,  WBC,  LACTICIDVEN Microbiology Recent Results (from the past 240 hour(s))  Resp Panel by RT-PCR (Flu A&B, Covid) Nasopharyngeal Swab     Status: None   Collection Time: 12/09/21  2:18 PM   Specimen: Nasopharyngeal Swab; Nasopharyngeal(NP) swabs in vial transport medium  Result Value Ref Range Status   SARS Coronavirus 2 by RT PCR NEGATIVE NEGATIVE Final    Comment: (NOTE) SARS-CoV-2 target nucleic acids are NOT DETECTED.  The SARS-CoV-2 RNA is generally detectable in upper respiratory specimens during the acute phase of infection. The lowest concentration of SARS-CoV-2 viral copies this assay can detect is 138 copies/mL. A negative result does not preclude SARS-Cov-2 infection and should not be used as the sole basis for treatment or other patient management decisions. A negative result may occur  with  improper specimen collection/handling, submission of specimen other than nasopharyngeal swab, presence of viral mutation(s) within the areas targeted by this assay, and inadequate number of viral copies(<138 copies/mL). A negative result must be combined with clinical observations, patient history, and epidemiological information. The expected result is Negative.  Fact Sheet for Patients:  EntrepreneurPulse.com.au  Fact Sheet for Healthcare Providers:  IncredibleEmployment.be  This test is no t yet approved or cleared by the [place] FDA and  has been authorized for detection and/or diagnosis of SARS-CoV-2 by FDA under an Emergency Use Authorization (EUA). This EUA will remain  in effect (meaning this test can be used) for the duration of the COVID-19 declaration under Section 564(b)(1) of the Act, 21 U.S.C.section 360bbb-3(b)(1), unless the authorization is terminated  or revoked sooner.       Influenza A by PCR NEGATIVE NEGATIVE Final   Influenza B by PCR NEGATIVE NEGATIVE Final    Comment: (NOTE) The Xpert Xpress SARS-CoV-2/FLU/RSV plus assay is intended as an aid in the diagnosis of influenza from Nasopharyngeal swab specimens and should not be used as a sole basis for treatment. Nasal washings and aspirates are unacceptable for Xpert Xpress SARS-CoV-2/FLU/RSV testing.  Fact Sheet for Patients: EntrepreneurPulse.com.au  Fact Sheet for Healthcare Providers: IncredibleEmployment.be  This test is not yet approved or cleared by the [place] FDA and has been authorized for detection and/or diagnosis of SARS-CoV-2 by FDA under an Emergency Use Authorization (EUA). This EUA will remain in effect (meaning this test can be used) for the duration of the COVID-19 declaration under Section 564(b)(1) of the Act, 21 U.S.C. section 360bbb-3(b)(1), unless the authorization is terminated  or revoked.  Performed at [facility], [place] [address], [place], [place] [address]      Time coordinating discharge: Over 30 minutes  SIGNED:   Donnamarie Poag [place] DO  [facility] 12/10/2021, 1:36 PM

## 2021-12-10 NOTE — Progress Notes (Signed)
OT Cancellation Note  Patient Details Name: Ashunti Schofield MRN: [id number] DOB: October 01, 1941   Cancelled Treatment:    Reason Eval/Treat Not Completed: OT screened, no needs identified, will sign off  Emelda Fear 12/10/2021, 12:44 PM  Nyoka Cowden OTR/L Acute Rehabilitation Services Pager: [phone] Office: [phone]

## 2021-12-10 NOTE — Plan of Care (Signed)

## 2021-12-10 NOTE — TOC Initial Note (Signed)
Transition of Care [facility]) - Initial/Assessment Note    Patient Details  Name: [name] MRN: [id number] Date of Birth: 1941/06/15  Transition of Care [facility]) CM/SW Contact:    Gala Lewandowsky, RN Phone Number: 12/10/2021, 4:47 PM  Clinical Narrative:  Case Manager offered choice to daughter Caden and she chose [place] first and [place] as second choice. Case Manager called and texted [facility]- no return call. Case Manager then called [facility] and the office will accept the patient. [facility] will call the daughter for visit times. No further needs from Case Manager at this time.                  Expected Discharge Plan: Home w Home Health Services Barriers to Discharge: No Barriers Identified   Patient Goals and CMS Choice Patient states their goals for this hospitalization and ongoing recovery are:: to return home CMS Medicare.gov Compare Post Acute Care list provided to:: Patient Choice offered to / list presented to : Patient, Adult Children  Expected Discharge Plan and Services Expected Discharge Plan: Home w Home Health Services In-house Referral: NA Discharge Planning Services: CM Consult Post Acute Care Choice: Home Health Living arrangements for the past 2 months: Single Family Home Expected Discharge Date: 12/10/21               DME Arranged: N/A DME Agency: NA       HH Arranged: PT HH Agency: [facility] Date [facility] Agency Contacted: 12/10/21 Time HH Agency Contacted: 1646 Representative spoke with at [facility]: Lambert Rhonda W Agency: Kandee Keen  Prior Living Arrangements/Services Living arrangements for the past 2 months: Single Family Home Lives with:: Adult Children, Relatives Patient language and need for interpreter reviewed:: Yes        Need for Family Participation in Patient Care: Yes (Comment) Care giver support system in place?: Yes (comment)   Criminal Activity/Legal Involvement Pertinent to Current Situation/Hospitalization: No - Comment as needed  Activities of  Daily Living Home Assistive Devices/Equipment: Dan Humphreys (specify type) ADL Screening (condition at time of admission) Patient's cognitive ability adequate to safely complete daily activities?: Yes Is the patient deaf or have difficulty hearing?: No Does the patient have difficulty seeing, even when wearing glasses/contacts?: No Does the patient have difficulty concentrating, remembering, or making decisions?: No Patient able to express need for assistance with ADLs?: Yes Does the patient have difficulty dressing or bathing?: No Independently performs ADLs?: Yes (appropriate for developmental age) Does the patient have difficulty walking or climbing stairs?: Yes Weakness of Legs: Both (left leg is weaker due to CVA 2017) Weakness of Arms/Hands: None  Permission Sought/Granted Permission sought to share information with : Case Manager, Magazine features editor, Family Supports Permission granted to share information with : Yes, Verbal Permission Granted     Permission granted to share info w AGENCY: Raeanne Barry        Emotional Assessment Appearance:: Appears stated age Attitude/Demeanor/Rapport: Engaged Affect (typically observed): Appropriate Orientation: : Oriented to Self, Oriented to Place, Oriented to  Time, Oriented to Situation Alcohol / Substance Use: Not Applicable Psych Involvement: No (comment)  Admission diagnosis:  Fall, initial encounter [W19.XXXA] Fall at home, initial encounter [phone], Y92.009] Syncope, unspecified syncope type [R55] Patient Active Problem List   Diagnosis Date Noted   Macrocytic anemia 12/09/2021   Back pain 12/09/2021   History of CVA with residual deficit 12/09/2021   Postoperative pain    Precordial pain    Pelvic fracture (HCC) 10/20/2020   Hip fracture (HCC) 10/18/2020  Closed fracture of multiple pubic rami, right, initial encounter (HCC) 10/17/2020   Abdominal pain 10/17/2020   Right knee pain 10/17/2020   Fall at home,  initial encounter 10/17/2020   SIRS (systemic inflammatory response syndrome) (HCC) 02/13/2018   Atrial fibrillation with RVR (HCC) 02/13/2018   Hypotension 02/12/2018   Primary osteoarthritis of left knee 02/09/2017   SDH (subdural hematoma)    Urinary frequency    Type 2 diabetes mellitus with peripheral neuropathy (HCC)    Cough    Gait disturbance, post-stroke 10/20/2016   Hemiparesis affecting left side as late effect of stroke (HCC) 10/20/2016   Essential hypertension 10/19/2016   Hyperlipidemia LDL goal <70 10/19/2016   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   Left hemiparesis (HCC)    Atrial fibrillation (HCC)    History of fall    History of traumatic subdural hematoma    Vascular headache    Hypokalemia    Leukocytosis    Acute blood loss anemia    Hypoalbuminemia due to protein-calorie malnutrition (HCC)    Dysphagia, post-stroke    Acute respiratory failure (HCC)    Acute embolic stroke (HCC) - R putamen/caudate and R insular infarcts s/p TICI3 revascularization w/ mechanical thrombectomy d/t AF not on [facility] 10/15/2016   Pressure injury of skin 10/15/2016   HCAP (healthcare-associated pneumonia) 11/09/2014   Weakness 08/25/2014   Acute bronchitis 08/25/2014   UTI (lower urinary tract infection) 08/25/2014   Fracture of lumbar spine (HCC) 08/25/2014   Subdural hematoma, post-traumatic 05/12/2014   Chronic atrial fibrillation (HCC) 05/12/2014   Diabetes mellitus (HCC) 05/12/2014   OSA (obstructive sleep apnea) 05/12/2014   PCP:  [name], MD Pharmacy:   [facility] [phone] [place], [place] - [address] AT [facility] [address] & [facility] [address] [place] [address] Phone: [phone] Fax: [phone]   Readmission Risk Interventions No flowsheet data found.

## 2022-10-13 ENCOUNTER — Encounter (HOSPITAL_COMMUNITY): Payer: Self-pay

## 2022-10-13 ENCOUNTER — Emergency Department (HOSPITAL_BASED_OUTPATIENT_CLINIC_OR_DEPARTMENT_OTHER): Payer: Medicare Other

## 2022-10-13 ENCOUNTER — Inpatient Hospital Stay (HOSPITAL_BASED_OUTPATIENT_CLINIC_OR_DEPARTMENT_OTHER)
Admission: EM | Admit: 2022-10-13 | Discharge: 2022-10-17 | DRG: 190 | Disposition: A | Discharge status: Home Health | Payer: Medicare Other | Attending: Internal Medicine | Admitting: Internal Medicine

## 2022-10-13 ENCOUNTER — Other Ambulatory Visit: Payer: Self-pay

## 2022-10-13 DIAGNOSIS — M549 Dorsalgia, unspecified: Secondary | ICD-10-CM | POA: Diagnosis present

## 2022-10-13 DIAGNOSIS — I482 Chronic atrial fibrillation, unspecified: Secondary | ICD-10-CM | POA: Diagnosis present

## 2022-10-13 DIAGNOSIS — G4733 Obstructive sleep apnea (adult) (pediatric): Secondary | ICD-10-CM | POA: Diagnosis present

## 2022-10-13 DIAGNOSIS — J440 Chronic obstructive pulmonary disease with acute lower respiratory infection: Secondary | ICD-10-CM | POA: Diagnosis not present

## 2022-10-13 DIAGNOSIS — I5033 Acute on chronic diastolic (congestive) heart failure: Secondary | ICD-10-CM | POA: Diagnosis not present

## 2022-10-13 DIAGNOSIS — Z1152 Encounter for screening for COVID-19: Secondary | ICD-10-CM

## 2022-10-13 DIAGNOSIS — E781 Pure hyperglyceridemia: Secondary | ICD-10-CM | POA: Diagnosis present

## 2022-10-13 DIAGNOSIS — Z7984 Long term (current) use of oral hypoglycemic drugs: Secondary | ICD-10-CM

## 2022-10-13 DIAGNOSIS — G8929 Other chronic pain: Secondary | ICD-10-CM | POA: Diagnosis present

## 2022-10-13 DIAGNOSIS — Z9841 Cataract extraction status, right eye: Secondary | ICD-10-CM

## 2022-10-13 DIAGNOSIS — Z961 Presence of intraocular lens: Secondary | ICD-10-CM | POA: Diagnosis present

## 2022-10-13 DIAGNOSIS — Z8679 Personal history of other diseases of the circulatory system: Secondary | ICD-10-CM

## 2022-10-13 DIAGNOSIS — E1142 Type 2 diabetes mellitus with diabetic polyneuropathy: Secondary | ICD-10-CM | POA: Diagnosis present

## 2022-10-13 DIAGNOSIS — J9601 Acute respiratory failure with hypoxia: Secondary | ICD-10-CM | POA: Diagnosis not present

## 2022-10-13 DIAGNOSIS — R1013 Epigastric pain: Secondary | ICD-10-CM | POA: Diagnosis present

## 2022-10-13 DIAGNOSIS — K219 Gastro-esophageal reflux disease without esophagitis: Secondary | ICD-10-CM | POA: Diagnosis present

## 2022-10-13 DIAGNOSIS — Z7901 Long term (current) use of anticoagulants: Secondary | ICD-10-CM

## 2022-10-13 DIAGNOSIS — Z9842 Cataract extraction status, left eye: Secondary | ICD-10-CM

## 2022-10-13 DIAGNOSIS — J205 Acute bronchitis due to respiratory syncytial virus: Secondary | ICD-10-CM | POA: Diagnosis present

## 2022-10-13 DIAGNOSIS — I69354 Hemiplegia and hemiparesis following cerebral infarction affecting left non-dominant side: Secondary | ICD-10-CM

## 2022-10-13 DIAGNOSIS — I10 Essential (primary) hypertension: Secondary | ICD-10-CM | POA: Diagnosis present

## 2022-10-13 DIAGNOSIS — Z79899 Other long term (current) drug therapy: Secondary | ICD-10-CM

## 2022-10-13 DIAGNOSIS — U071 COVID-19: Principal | ICD-10-CM

## 2022-10-13 DIAGNOSIS — I110 Hypertensive heart disease with heart failure: Secondary | ICD-10-CM | POA: Diagnosis present

## 2022-10-13 DIAGNOSIS — Z888 Allergy status to other drugs, medicaments and biological substances status: Secondary | ICD-10-CM

## 2022-10-13 DIAGNOSIS — G8194 Hemiplegia, unspecified affecting left nondominant side: Secondary | ICD-10-CM | POA: Diagnosis present

## 2022-10-13 LAB — TROPONIN I (HIGH SENSITIVITY)
Troponin I (High Sensitivity): 5 ng/L (ref <18)
Troponin I (High Sensitivity): 6 ng/L (ref <18)

## 2022-10-13 LAB — BASIC METABOLIC PANEL
Anion gap: 12 (ref 5–15)
BUN: 17 mg/dL (ref 8–23)
CO2: 25 mmol/L (ref 22–32)
Calcium: 9.5 mg/dL (ref 8.9–10.3)
Chloride: 98 mmol/L (ref 98–111)
Creatinine, Ser: 0.76 mg/dL (ref 0.44–1.00)
GFR, Estimated: >60 mL/min (ref >60)
Glucose, Bld: 261 mg/dL — ABNORMAL HIGH (ref 70–99)
Potassium: 4 mmol/L (ref 3.5–5.1)
Sodium: 135 mmol/L (ref 135–145)

## 2022-10-13 LAB — CBC
HCT: 36.4 % (ref 36.0–46.0)
Hemoglobin: 11.6 g/dL — ABNORMAL LOW (ref 12.0–15.0)
MCH: 33.6 pg (ref 26.0–34.0)
MCHC: 31.9 g/dL (ref 30.0–36.0)
MCV: 105.5 fL — ABNORMAL HIGH (ref 80.0–100.0)
Platelets: 339 10*3/uL (ref 150–400)
RBC: 3.45 MIL/uL — ABNORMAL LOW (ref 3.87–5.11)
RDW: 13.2 % (ref 11.5–15.5)
WBC: 10.9 10*3/uL — ABNORMAL HIGH (ref 4.0–10.5)
nRBC: 0 % (ref 0.0–0.2)

## 2022-10-13 LAB — RESP PANEL BY RT-PCR (RSV, FLU A&B, COVID)  RVPGX2
Influenza A by PCR: NEGATIVE
Influenza B by PCR: NEGATIVE
Resp Syncytial Virus by PCR: POSITIVE — AB
SARS Coronavirus 2 by RT PCR: NEGATIVE

## 2022-10-13 LAB — BRAIN NATRIURETIC PEPTIDE: B Natriuretic Peptide: 273.3 pg/mL — ABNORMAL HIGH (ref 0.0–100.0)

## 2022-10-13 MED ORDER — IPRATROPIUM-ALBUTEROL 0.5-2.5 (3) MG/3ML IN SOLN
RESPIRATORY_TRACT | Status: AC
  Administered 2022-10-13: 3 mL via RESPIRATORY_TRACT
  Filled 2022-10-13: qty 3

## 2022-10-13 MED ORDER — FUROSEMIDE 10 MG/ML IJ SOLN
40.0000 mg | Freq: Once | INTRAMUSCULAR | Status: DC
  Filled 2022-10-13: qty 4

## 2022-10-13 MED ORDER — ALBUTEROL SULFATE (2.5 MG/3ML) 0.083% IN NEBU
INHALATION_SOLUTION | RESPIRATORY_TRACT | Status: AC
  Administered 2022-10-13: 2.5 mg via RESPIRATORY_TRACT
  Filled 2022-10-13: qty 3

## 2022-10-13 MED ORDER — SODIUM CHLORIDE 0.9 % IV BOLUS
1000.0000 mL | Freq: Once | INTRAVENOUS | Status: AC
  Administered 2022-10-13: 1000 mL via INTRAVENOUS

## 2022-10-13 MED ORDER — LACTATED RINGERS IV SOLN: INTRAVENOUS | Status: AC

## 2022-10-13 MED ORDER — LEVALBUTEROL HCL 1.25 MG/0.5ML IN NEBU
1.2500 mg | INHALATION_SOLUTION | Freq: Once | RESPIRATORY_TRACT | Status: AC
  Administered 2022-10-13: 1.25 mg via RESPIRATORY_TRACT
  Filled 2022-10-13: qty 0.5

## 2022-10-13 MED ORDER — HALOPERIDOL LACTATE 5 MG/ML IJ SOLN
1.0000 mg | Freq: Once | INTRAMUSCULAR | Status: AC
  Administered 2022-10-13: 1 mg via INTRAVENOUS
  Filled 2022-10-13: qty 1

## 2022-10-13 MED ORDER — IPRATROPIUM-ALBUTEROL 0.5-2.5 (3) MG/3ML IN SOLN: 3.0000 mL | Freq: Once | RESPIRATORY_TRACT | Status: AC

## 2022-10-13 MED ORDER — ALBUTEROL SULFATE (2.5 MG/3ML) 0.083% IN NEBU: 2.5000 mg | INHALATION_SOLUTION | Freq: Once | RESPIRATORY_TRACT | Status: AC

## 2022-10-13 NOTE — ED Notes (Signed)
Awaiting pt arrival to room; per charge nurse pt receiving neb tx in triage; will be xferred to room upon completion of neb.

## 2022-10-13 NOTE — ED Notes (Signed)
RT called to triage to assess pt w/SOB. Upon arrival pt unable to speak in complete sentences, nasal flaring, retractions, and tachypeic. Pt BLBS rhonchi/insp/exp whz uppers, CC/exp whz lowers. Pt in some moderate distress on RA pt sats 95%. RT initiate a 5/5 breathing treatment. RT will continue to monitor and reassess pt while at [facility] ED.

## 2022-10-13 NOTE — ED Provider Notes (Signed)
[facility] EMERGENCY DEPT Provider Note   CSN: [id number] Arrival date & time: 10/13/22  1846     History  Chief Complaint  Patient presents with   Chest Pain   Shortness of Breath    [name] is a 81 y.o. female.  81 yo F with a chief complaints of difficulty breathing.  This been going on for couple days.  Coughing congested fevers.  Saw her family doctor and was diagnosed with COVID today.  Was then sent here for evaluation.  Very short of breath on initial exam.  Limits history.  Level 5 caveat.   Chest Pain Associated symptoms: shortness of breath   Shortness of Breath Associated symptoms: chest pain        Home Medications Prior to Admission medications   Medication Sig Start Date End Date Taking? Authorizing Provider  acetaminophen (TYLENOL) 325 MG tablet Take 2 tablets (650 mg total) by mouth every 6 (six) hours as needed for mild pain, headache or fever. 11/09/20   Angiulli, Mcarthur Rossetti, PA-C  apixaban (ELIQUIS) 5 MG TABS tablet Take 1 tablet (5 mg total) by mouth 2 (two) times daily. 11/09/20   Angiulli, Mcarthur Rossetti, PA-C  Calcium Carbonate (CALCIUM-CARB 600 PO) Take 1 tablet by mouth daily. Gummy    [provider]  cetirizine (ZYRTEC) 10 MG tablet Take 10 mg daily as needed by mouth (seasonal allergies).    [provider]  Cholecalciferol (VITAMIN D) 50 MCG (2000 UT) tablet Take 1 tablet (2,000 Units total) by mouth daily. 11/09/20   Angiulli, Mcarthur Rossetti, PA-C  DULoxetine (CYMBALTA) 30 MG capsule Take 30 mg by mouth at bedtime. 10/14/21   [provider]  fluticasone (FLONASE) 50 MCG/ACT nasal spray Place 1 spray daily as needed into both nostrils (seasonal allergies).  02/18/14   [provider]  FORTEO 600 MCG/2.4ML SOPN Inject 20 mcg into the skin at bedtime. 12/10/21   [place], Alvira Philips, DO  HYDROcodone-acetaminophen (NORCO/VICODIN) 5-325 MG tablet Take 1 tablet by mouth every 6 (six) hours as needed for moderate pain.     [provider]  hydrocortisone 2.5 % cream Apply 1 application topically daily as needed (itching). 06/17/21   [provider]  lidocaine (LIDODERM) 5 % Place 2 patches onto the skin daily. Remove & Discard patch within 12 hours or as directed by MD Patient taking differently: Place 2 patches onto the skin daily as needed (back pain). Remove & Discard patch within 12 hours or as directed by MD 11/09/20   Angiulli, Mcarthur Rossetti, PA-C  metFORMIN (GLUCOPHAGE) 500 MG tablet Take 0.5 tablets (250 mg total) by mouth 2 (two) times daily with a meal. Patient taking differently: Take 500 mg by mouth 2 (two) times daily with a meal. 11/09/20   Angiulli, Mcarthur Rossetti, PA-C  metoprolol tartrate (LOPRESSOR) 25 MG tablet Take 0.5 tablets (12.5 mg total) by mouth 2 (two) times daily. 11/12/20   Azalee Course, PA  nitroGLYCERIN (NITROSTAT) 0.4 MG SL tablet Place 1 tablet (0.4 mg total) under the tongue every 5 (five) minutes as needed for chest pain. 11/09/20   Angiulli, Mcarthur Rossetti, PA-C  polyethylene glycol (MIRALAX / GLYCOLAX) 17 g packet Take 17 g by mouth daily as needed for mild constipation. 10/20/20   Glade Lloyd, MD  pravastatin (PRAVACHOL) 20 MG tablet Take 1 tablet (20 mg total) by mouth at bedtime. 11/09/20   Angiulli, Mcarthur Rossetti, PA-C  tiZANidine (ZANAFLEX) 4 MG tablet Take 2-4 mg by mouth at bedtime  as needed for muscle spasms. 10/05/21   [provider]  traMADol (ULTRAM) 50 MG tablet Take 1 tablet (50 mg total) by mouth every 6 (six) hours as needed for moderate pain. 11/09/20   Angiulli, Mcarthur Rossettianiel J, PA-C  vitamin k 100 MCG tablet Take 100 mcg by mouth daily.    [provider]      Allergies    Bactrim [sulfamethoxazole-trimethoprim] and Bactrim [sulfamethoxazole-trimethoprim]    Review of Systems   Review of Systems  Respiratory:  Positive for shortness of breath.   Cardiovascular:  Positive for chest pain.    Physical Exam Updated Vital Signs BP (!) 130/99   Pulse (!) 131    Temp 98.1 F (36.7 C) (Oral)   Resp 18   SpO2 100%  Physical Exam Vitals and nursing note reviewed.  Constitutional:      General: She is not in acute distress.    Appearance: She is well-developed. She is not diaphoretic.  HENT:     Head: Normocephalic and atraumatic.  Eyes:     Pupils: Pupils are equal, round, and reactive to light.  Cardiovascular:     Rate and Rhythm: Tachycardia present. Rhythm irregular.     Heart sounds: No murmur heard.    No friction rub. No gallop.  Pulmonary:     Effort: Pulmonary effort is normal.     Breath sounds: No wheezing or rales.     Comments: Coarse breath sounds in all fields with tachypnea Abdominal:     General: There is no distension.     Palpations: Abdomen is soft.     Tenderness: There is no abdominal tenderness.  Musculoskeletal:        General: No tenderness.     Cervical back: Normal range of motion and neck supple.     Right lower leg: Edema present.     Left lower leg: Edema present.  Skin:    General: Skin is warm and dry.  Neurological:     Mental Status: She is alert and oriented to person, place, and time.  Psychiatric:        Behavior: Behavior normal.     ED Results / Procedures / Treatments   Labs (all labs ordered are listed, but only abnormal results are displayed) Labs Reviewed  BASIC METABOLIC PANEL - Abnormal; Notable for the following components:      Result Value   Glucose, Bld 261 (*)    All other components within normal limits  CBC - Abnormal; Notable for the following components:   WBC 10.9 (*)    RBC 3.45 (*)    Hemoglobin 11.6 (*)    MCV 105.5 (*)    All other components within normal limits  BRAIN NATRIURETIC PEPTIDE - Abnormal; Notable for the following components:   B Natriuretic Peptide 273.3 (*)    All other components within normal limits  CULTURE, BLOOD (ROUTINE X 2)  CULTURE, BLOOD (ROUTINE X 2)  RESP PANEL BY RT-PCR (RSV, FLU A&B, COVID)  RVPGX2  TROPONIN I (HIGH SENSITIVITY)   TROPONIN I (HIGH SENSITIVITY)    EKG EKG Interpretation  Date/Time:  Thursday October 13 2022 19:32:35 EST Ventricular Rate:  129 PR Interval:    QRS Duration: 82 QT Interval:  272 QTC Calculation: 398 R Axis:   19 Text Interpretation: Atrial fibrillation with rapid ventricular response Nonspecific T wave abnormality Abnormal ECG Since last tracing rate faster Confirmed by Melene PlanFloyd, Melburn Treiber [phone]([id number]) on 10/13/2022 8:23:16 PM  [facility]  [facility]  Result Date: 10/13/2022 CLINICAL DATA:  Dyspnea EXAM: PORTABLE CHEST 1 VIEW COMPARISON:  12/09/2021 FINDINGS: Lung volumes are small. Stable retrocardiac opacity likely reflecting parenchymal scarring in this region. No pneumothorax or pleural effusion. Cardiac size is within normal limits when accounting for poor pulmonary insufflation, stable since prior examination. Pulmonary vascularity is normal. No acute bone abnormality. IMPRESSION: 1. Pulmonary hypoinflation. Electronically Signed   By: Helyn Numbers M.D.   On: 10/13/2022 20:12    Procedures .Critical Care  Performed by: Melene Plan, DO Authorized by: Melene Plan, DO   Critical care provider statement:    Critical care time (minutes):  80   Critical care time was exclusive of:  Separately billable procedures and treating other patients   Critical care was time spent personally by me on the following activities:  Development of treatment plan with patient or surrogate, discussions with consultants, evaluation of patient's response to treatment, examination of patient, ordering and review of laboratory studies, ordering and review of radiographic studies, ordering and performing treatments and interventions, pulse oximetry, re-evaluation of patient's condition and review of old charts   Care discussed with: admitting provider       Medications Ordered in ED Medications  ipratropium-albuterol (DUONEB) 0.5-2.5 (3) MG/3ML nebulizer solution 3 mL (3 mLs Nebulization Given 10/13/22  1932)  albuterol (PROVENTIL) (2.5 MG/3ML) 0.083% nebulizer solution 2.5 mg (2.5 mg Nebulization Given 10/13/22 1932)  sodium chloride 0.9 % bolus 1,000 mL (0 mLs Intravenous Stopped 10/13/22 2146)  levalbuterol (XOPENEX) nebulizer solution 1.25 mg (1.25 mg Nebulization Given 10/13/22 2115)  haloperidol lactate (HALDOL) injection 1 mg (1 mg Intravenous Given 10/13/22 2243)    ED Course/ Medical Decision Making/ A&P                           Medical Decision Making Amount and/or Complexity of Data Reviewed Labs: ordered. Radiology: ordered.  Risk Prescription drug management. Decision regarding hospitalization.   81 yo F with a chief complaints of difficulty breathing.  This has been going on for couple days.  Diagnosed with COVID today.  Chest x-ray with viral syndrome versus new fluid overload.  She has some swollen lower extremities.  BNP is in the indeterminate range.  On record review no obvious history of heart failure the family says she has a history of right heart failure.  Given a bolus of IV fluids with some improvement.  Had some significant wheezes initially and was given some nebs with an increase of heart rate.  Patient had some increased respiratory effort.  Will do a trial of BiPAP.  The patients results and plan were reviewed and discussed.   Any x-rays performed were independently reviewed by myself.   Differential diagnosis were considered with the presenting HPI.  Medications  ipratropium-albuterol (DUONEB) 0.5-2.5 (3) MG/3ML nebulizer solution 3 mL (3 mLs Nebulization Given 10/13/22 1932)  albuterol (PROVENTIL) (2.5 MG/3ML) 0.083% nebulizer solution 2.5 mg (2.5 mg Nebulization Given 10/13/22 1932)  sodium chloride 0.9 % bolus 1,000 mL (0 mLs Intravenous Stopped 10/13/22 2146)  levalbuterol (XOPENEX) nebulizer solution 1.25 mg (1.25 mg Nebulization Given 10/13/22 2115)  haloperidol lactate (HALDOL) injection 1 mg (1 mg Intravenous Given 10/13/22 2243)     Vitals:   10/13/22 2116 10/13/22 2139 10/13/22 2142 10/13/22 2200  BP: 137/72   (!) 130/99  Pulse: (!) 139  (!) 141 (!) 131  Resp: (!) 26  (!) 27 18  Temp:  98.1 F (36.7  C)    TempSrc:  Oral    SpO2: 100%  100% 100%    Final diagnoses:  COVID-19 virus infection  Acute respiratory failure with hypoxia (HCC)    Admission/ observation were discussed with the admitting physician, patient and/or family and they are comfortable with the plan.          Final Clinical Impression(s) / ED Diagnoses Final diagnoses:  COVID-19 virus infection  Acute respiratory failure with hypoxia [facility])    Rx / DC Orders ED Discharge Orders     None         Melene Plan, DO 10/13/22 2302

## 2022-10-13 NOTE — Progress Notes (Signed)
Plan of Care Note for accepted transfer   Patient: [name] MRN: [id number]   DOA: 10/13/2022  Facility requesting transfer: [facility]. Requesting Provider: Dr. Adela Lank, EDP. Reason for transfer: COVID-19 viral infection, acute hypoxic respiratory failure, chronic A-fib with RVR.  Facility course: The patient is a 81 year old [place] female with past medical history significant for chronic atrial fibrillation on Eliquis and Lopressor, type 2 diabetes, hyperlipidemia, asthma, reported history of pulmonary hypertension, who presented to the ED with complaints of shortness of breath.  In the ED, she was noted to be in A-fib with RVR, with heart rates up to the 160s after a nebulizer treatment.  Tested positive for COVID-19 virus.  Chest x-ray revealed normal pulmonary vascularity, small lung volumes, stable retrocardiac opacity likely reflecting parenchymal scarring in this region.  No pneumothorax or pleural effusion.    The patient was also noted to be hypoxic, initially requiring 7 L HFNC to maintain O2 saturation greater than 92%.  O2 requirement was weaned down to 5 L HFNC.  She did not require BiPAP.  She had a mildly elevated BNP 273 with no overt clinical signs of volume overload.  Her high-sensitivity troponin was negative.  Her heart rate improved to the 120s without a rate control agent.  EDP requested admission for further management.  The patient was admitted at [facility] progressive care unit as inpatient status.  Plan of care: The patient is accepted for admission to Progressive unit, at [facility] as inpatient status.  Author: Darlin Drop, DO 10/13/2022  Check [email or web address] for on-call coverage.  Nursing staff, Please call [facility] Admits & Consults System-Wide number on Amion as soon as patient's arrival, so appropriate admitting provider can evaluate the pt.

## 2022-10-13 NOTE — ED Notes (Signed)
Dr, Adela Lank at bedside

## 2022-10-13 NOTE — ED Triage Notes (Signed)
Pt tested positive for Covid today at PCP. Pt complaints of chest pain, SOB and headache. Audible wheezing in triage. Respiratory therapist in triage to assess.

## 2022-10-13 NOTE — ED Notes (Signed)
Radiology at bedside for PCXR

## 2022-10-13 NOTE — ED Notes (Signed)
RT called report to receiving RT at [facility].

## 2022-10-13 NOTE — ED Notes (Signed)
RT placed pt on NIV/BIPAP/PC ventilation for increased WOB. Pt tolerating well at this time. Pt BLBS rhonchi/exp whz. Pt respiratory status stable on NIV at this time. Pt becoming agitated while on bipap. Daughter asking for something for anxiety. MD notified. Pt settings 10/5 BUR 15 40% with sats of 100%. RT will continue to monitor while at [facility] ED.

## 2022-10-14 ENCOUNTER — Inpatient Hospital Stay (HOSPITAL_COMMUNITY): Payer: Medicare Other

## 2022-10-14 ENCOUNTER — Encounter (HOSPITAL_COMMUNITY): Payer: Self-pay | Admitting: Internal Medicine

## 2022-10-14 DIAGNOSIS — E1142 Type 2 diabetes mellitus with diabetic polyneuropathy: Secondary | ICD-10-CM

## 2022-10-14 DIAGNOSIS — Z8679 Personal history of other diseases of the circulatory system: Secondary | ICD-10-CM | POA: Diagnosis not present

## 2022-10-14 DIAGNOSIS — J9601 Acute respiratory failure with hypoxia: Secondary | ICD-10-CM

## 2022-10-14 DIAGNOSIS — G8929 Other chronic pain: Secondary | ICD-10-CM | POA: Diagnosis not present

## 2022-10-14 DIAGNOSIS — I110 Hypertensive heart disease with heart failure: Secondary | ICD-10-CM | POA: Diagnosis not present

## 2022-10-14 DIAGNOSIS — E781 Pure hyperglyceridemia: Secondary | ICD-10-CM | POA: Diagnosis not present

## 2022-10-14 DIAGNOSIS — Z9841 Cataract extraction status, right eye: Secondary | ICD-10-CM | POA: Diagnosis not present

## 2022-10-14 DIAGNOSIS — Z79899 Other long term (current) drug therapy: Secondary | ICD-10-CM | POA: Diagnosis not present

## 2022-10-14 DIAGNOSIS — K219 Gastro-esophageal reflux disease without esophagitis: Secondary | ICD-10-CM | POA: Diagnosis present

## 2022-10-14 DIAGNOSIS — Z7901 Long term (current) use of anticoagulants: Secondary | ICD-10-CM | POA: Diagnosis not present

## 2022-10-14 DIAGNOSIS — R1013 Epigastric pain: Secondary | ICD-10-CM | POA: Diagnosis present

## 2022-10-14 DIAGNOSIS — I5033 Acute on chronic diastolic (congestive) heart failure: Secondary | ICD-10-CM | POA: Diagnosis not present

## 2022-10-14 DIAGNOSIS — J205 Acute bronchitis due to respiratory syncytial virus: Secondary | ICD-10-CM | POA: Diagnosis present

## 2022-10-14 DIAGNOSIS — Z888 Allergy status to other drugs, medicaments and biological substances status: Secondary | ICD-10-CM | POA: Diagnosis not present

## 2022-10-14 DIAGNOSIS — Z9842 Cataract extraction status, left eye: Secondary | ICD-10-CM | POA: Diagnosis not present

## 2022-10-14 DIAGNOSIS — I69354 Hemiplegia and hemiparesis following cerebral infarction affecting left non-dominant side: Secondary | ICD-10-CM | POA: Diagnosis not present

## 2022-10-14 DIAGNOSIS — I482 Chronic atrial fibrillation, unspecified: Secondary | ICD-10-CM

## 2022-10-14 DIAGNOSIS — Z961 Presence of intraocular lens: Secondary | ICD-10-CM | POA: Diagnosis present

## 2022-10-14 DIAGNOSIS — Z7984 Long term (current) use of oral hypoglycemic drugs: Secondary | ICD-10-CM | POA: Diagnosis not present

## 2022-10-14 DIAGNOSIS — I10 Essential (primary) hypertension: Secondary | ICD-10-CM

## 2022-10-14 DIAGNOSIS — G4733 Obstructive sleep apnea (adult) (pediatric): Secondary | ICD-10-CM | POA: Diagnosis present

## 2022-10-14 DIAGNOSIS — Z1152 Encounter for screening for COVID-19: Secondary | ICD-10-CM | POA: Diagnosis not present

## 2022-10-14 DIAGNOSIS — M549 Dorsalgia, unspecified: Secondary | ICD-10-CM | POA: Diagnosis present

## 2022-10-14 DIAGNOSIS — R0609 Other forms of dyspnea: Secondary | ICD-10-CM | POA: Diagnosis not present

## 2022-10-14 DIAGNOSIS — G8194 Hemiplegia, unspecified affecting left nondominant side: Secondary | ICD-10-CM

## 2022-10-14 DIAGNOSIS — J440 Chronic obstructive pulmonary disease with acute lower respiratory infection: Secondary | ICD-10-CM | POA: Diagnosis not present

## 2022-10-14 LAB — CBC
HCT: 31.9 % — ABNORMAL LOW (ref 36.0–46.0)
Hemoglobin: 10 g/dL — ABNORMAL LOW (ref 12.0–15.0)
MCH: 33.8 pg (ref 26.0–34.0)
MCHC: 31.3 g/dL (ref 30.0–36.0)
MCV: 107.8 fL — ABNORMAL HIGH (ref 80.0–100.0)
Platelets: 305 10*3/uL (ref 150–400)
RBC: 2.96 MIL/uL — ABNORMAL LOW (ref 3.87–5.11)
RDW: 13 % (ref 11.5–15.5)
WBC: 10.6 10*3/uL — ABNORMAL HIGH (ref 4.0–10.5)
nRBC: 0 % (ref 0.0–0.2)

## 2022-10-14 LAB — ECHOCARDIOGRAM COMPLETE
AR max vel: 1.64 cm2
AV Area VTI: 1.59 cm2
AV Area mean vel: 1.6 cm2
AV Mean grad: 7 mmHg
AV Peak grad: 12.2 mmHg
Ao pk vel: 1.74 m/s
Area-P 1/2: 3.46 cm2
Calc EF: 62.5 %
MV M vel: 4.46 m/s
MV Peak grad: 79.6 mmHg
P 1/2 time: 285 msec
Radius: 1 cm
S' Lateral: 2.35 cm
Single Plane A2C EF: 56 %
Single Plane A4C EF: 70 %

## 2022-10-14 LAB — BASIC METABOLIC PANEL
Anion gap: 14 (ref 5–15)
BUN: 11 mg/dL (ref 8–23)
CO2: 23 mmol/L (ref 22–32)
Calcium: 8.7 mg/dL — ABNORMAL LOW (ref 8.9–10.3)
Chloride: 103 mmol/L (ref 98–111)
Creatinine, Ser: 0.63 mg/dL (ref 0.44–1.00)
GFR, Estimated: >60 mL/min (ref >60)
Glucose, Bld: 139 mg/dL — ABNORMAL HIGH (ref 70–99)
Potassium: 3.9 mmol/L (ref 3.5–5.1)
Sodium: 140 mmol/L (ref 135–145)

## 2022-10-14 LAB — GLUCOSE, CAPILLARY
Glucose-Capillary: 135 mg/dL — ABNORMAL HIGH (ref 70–99)
Glucose-Capillary: 145 mg/dL — ABNORMAL HIGH (ref 70–99)
Glucose-Capillary: 189 mg/dL — ABNORMAL HIGH (ref 70–99)
Glucose-Capillary: 146 mg/dL — ABNORMAL HIGH (ref 70–99)
Glucose-Capillary: 215 mg/dL — ABNORMAL HIGH (ref 70–99)

## 2022-10-14 LAB — HIV ANTIBODY (ROUTINE TESTING W REFLEX): HIV Screen 4th Generation wRfx: NONREACTIVE

## 2022-10-14 LAB — PROCALCITONIN: Procalcitonin: 0.11 ng/mL

## 2022-10-14 MED ORDER — ADULT MULTIVITAMIN W/MINERALS CH
1.0000 | ORAL_TABLET | Freq: Every day | ORAL | Status: DC
  Administered 2022-10-14 – 2022-10-17 (×4): 1 via ORAL
  Filled 2022-10-14 (×4): qty 1

## 2022-10-14 MED ORDER — GUAIFENESIN-DM 100-10 MG/5ML PO SYRP
5.0000 mL | ORAL_SOLUTION | ORAL | Status: DC | PRN
  Administered 2022-10-16: 5 mL via ORAL
  Filled 2022-10-14: qty 5

## 2022-10-14 MED ORDER — INSULIN ASPART 100 UNIT/ML IJ SOLN
0.0000 [IU] | Freq: Three times a day (TID) | INTRAMUSCULAR | Status: DC
  Administered 2022-10-14 – 2022-10-15 (×2): 2 [IU] via SUBCUTANEOUS
  Administered 2022-10-15: 1 [IU] via SUBCUTANEOUS
  Administered 2022-10-15: 3 [IU] via SUBCUTANEOUS
  Administered 2022-10-16: 2 [IU] via SUBCUTANEOUS
  Administered 2022-10-16: 3 [IU] via SUBCUTANEOUS

## 2022-10-14 MED ORDER — LIDOCAINE 5 % EX PTCH: 2.0000 | MEDICATED_PATCH | Freq: Every day | CUTANEOUS | Status: DC | PRN

## 2022-10-14 MED ORDER — METHYLPREDNISOLONE SODIUM SUCC 40 MG IJ SOLR
40.0000 mg | Freq: Two times a day (BID) | INTRAMUSCULAR | Status: DC
  Administered 2022-10-14 – 2022-10-15 (×3): 40 mg via INTRAVENOUS
  Filled 2022-10-14 (×3): qty 1

## 2022-10-14 MED ORDER — METOPROLOL TARTRATE 12.5 MG HALF TABLET
12.5000 mg | ORAL_TABLET | Freq: Two times a day (BID) | ORAL | Status: DC
  Administered 2022-10-14 – 2022-10-16 (×5): 12.5 mg via ORAL
  Filled 2022-10-14 (×5): qty 1

## 2022-10-14 MED ORDER — UMECLIDINIUM BROMIDE 62.5 MCG/ACT IN AEPB
1.0000 | INHALATION_SPRAY | Freq: Every day | RESPIRATORY_TRACT | Status: DC
  Administered 2022-10-14: 1 via RESPIRATORY_TRACT
  Filled 2022-10-14: qty 7

## 2022-10-14 MED ORDER — ENSURE ENLIVE PO LIQD
237.0000 mL | Freq: Two times a day (BID) | ORAL | Status: DC
  Administered 2022-10-14: 237 mL via ORAL

## 2022-10-14 MED ORDER — MOMETASONE FURO-FORMOTEROL FUM 200-5 MCG/ACT IN AERO
2.0000 | INHALATION_SPRAY | Freq: Two times a day (BID) | RESPIRATORY_TRACT | Status: DC
  Administered 2022-10-14: 2 via RESPIRATORY_TRACT
  Filled 2022-10-14: qty 8.8

## 2022-10-14 MED ORDER — IPRATROPIUM-ALBUTEROL 0.5-2.5 (3) MG/3ML IN SOLN
3.0000 mL | RESPIRATORY_TRACT | Status: DC
  Administered 2022-10-14: 3 mL via RESPIRATORY_TRACT
  Filled 2022-10-14: qty 3

## 2022-10-14 MED ORDER — GUAIFENESIN ER 600 MG PO TB12
600.0000 mg | ORAL_TABLET | Freq: Two times a day (BID) | ORAL | Status: DC
  Administered 2022-10-14 – 2022-10-17 (×7): 600 mg via ORAL
  Filled 2022-10-14 (×7): qty 1

## 2022-10-14 MED ORDER — ENOXAPARIN SODIUM 40 MG/0.4ML IJ SOSY: 40.0000 mg | PREFILLED_SYRINGE | INTRAMUSCULAR | Status: DC

## 2022-10-14 MED ORDER — ALBUTEROL SULFATE (2.5 MG/3ML) 0.083% IN NEBU
2.5000 mg | INHALATION_SOLUTION | RESPIRATORY_TRACT | Status: DC | PRN
  Administered 2022-10-14: 2.5 mg via RESPIRATORY_TRACT
  Filled 2022-10-14: qty 3

## 2022-10-14 MED ORDER — LEVALBUTEROL HCL 0.63 MG/3ML IN NEBU
0.6300 mg | INHALATION_SOLUTION | Freq: Four times a day (QID) | RESPIRATORY_TRACT | Status: DC
  Administered 2022-10-14 – 2022-10-17 (×12): 0.63 mg via RESPIRATORY_TRACT
  Filled 2022-10-14 (×13): qty 3

## 2022-10-14 MED ORDER — PREDNISONE 20 MG PO TABS: 40.0000 mg | ORAL_TABLET | Freq: Every day | ORAL | Status: DC

## 2022-10-14 MED ORDER — IPRATROPIUM BROMIDE 0.02 % IN SOLN
0.5000 mg | Freq: Four times a day (QID) | RESPIRATORY_TRACT | Status: DC
  Administered 2022-10-14 – 2022-10-17 (×12): 0.5 mg via RESPIRATORY_TRACT
  Filled 2022-10-14 (×13): qty 2.5

## 2022-10-14 MED ORDER — FLUTICASONE PROPIONATE 50 MCG/ACT NA SUSP
1.0000 | Freq: Every day | NASAL | Status: DC
  Administered 2022-10-15 – 2022-10-16 (×2): 1 via NASAL
  Filled 2022-10-14: qty 16

## 2022-10-14 MED ORDER — LEVALBUTEROL HCL 0.63 MG/3ML IN NEBU
0.6300 mg | INHALATION_SOLUTION | RESPIRATORY_TRACT | Status: DC | PRN
  Administered 2022-10-14: 0.63 mg via RESPIRATORY_TRACT

## 2022-10-14 MED ORDER — INSULIN ASPART 100 UNIT/ML IJ SOLN
0.0000 [IU] | INTRAMUSCULAR | Status: DC
  Administered 2022-10-14 (×2): 2 [IU] via SUBCUTANEOUS

## 2022-10-14 MED ORDER — APIXABAN 5 MG PO TABS
5.0000 mg | ORAL_TABLET | Freq: Two times a day (BID) | ORAL | Status: DC
  Administered 2022-10-14 – 2022-10-17 (×7): 5 mg via ORAL
  Filled 2022-10-14 (×7): qty 1

## 2022-10-14 MED ORDER — METHYLPREDNISOLONE SODIUM SUCC 125 MG IJ SOLR
80.0000 mg | Freq: Every day | INTRAMUSCULAR | Status: AC
  Administered 2022-10-14: 80 mg via INTRAVENOUS
  Filled 2022-10-14: qty 2

## 2022-10-14 MED ORDER — LORATADINE 10 MG PO TABS
10.0000 mg | ORAL_TABLET | Freq: Every day | ORAL | Status: DC
  Administered 2022-10-14 – 2022-10-17 (×4): 10 mg via ORAL
  Filled 2022-10-14 (×4): qty 1

## 2022-10-14 MED ORDER — BUDESONIDE 0.25 MG/2ML IN SUSP
0.2500 mg | Freq: Two times a day (BID) | RESPIRATORY_TRACT | Status: DC
  Administered 2022-10-14 – 2022-10-17 (×6): 0.25 mg via RESPIRATORY_TRACT
  Filled 2022-10-14 (×6): qty 2

## 2022-10-14 MED ORDER — FUROSEMIDE 10 MG/ML IJ SOLN
20.0000 mg | Freq: Once | INTRAMUSCULAR | Status: AC
  Administered 2022-10-14: 20 mg via INTRAVENOUS
  Filled 2022-10-14: qty 2

## 2022-10-14 MED ORDER — LEVALBUTEROL HCL 0.63 MG/3ML IN NEBU
0.6300 mg | INHALATION_SOLUTION | Freq: Four times a day (QID) | RESPIRATORY_TRACT | Status: DC | PRN
  Filled 2022-10-14: qty 3

## 2022-10-14 MED ORDER — IPRATROPIUM-ALBUTEROL 0.5-2.5 (3) MG/3ML IN SOLN
3.0000 mL | RESPIRATORY_TRACT | Status: AC
  Administered 2022-10-14: 3 mL via RESPIRATORY_TRACT

## 2022-10-14 MED ORDER — ACETAMINOPHEN 325 MG PO TABS
650.0000 mg | ORAL_TABLET | Freq: Four times a day (QID) | ORAL | Status: DC | PRN
  Administered 2022-10-16: 650 mg via ORAL
  Filled 2022-10-14: qty 2

## 2022-10-14 MED ORDER — DULOXETINE HCL 30 MG PO CPEP
30.0000 mg | ORAL_CAPSULE | Freq: Every day | ORAL | Status: DC
  Administered 2022-10-14 – 2022-10-16 (×3): 30 mg via ORAL
  Filled 2022-10-14 (×3): qty 1

## 2022-10-14 NOTE — Assessment & Plan Note (Addendum)
A.Fib, now in mild RVR with HR 110 secondary to.  Had been much worse earlier in evening when she had respiratory distress in ED (rates 150s) and was being given large amounts of inhaled B agonist therapy to try and help her breathe Tele monitor Resume home metoprolol PO with AM dose Cont home eliquis.

## 2022-10-14 NOTE — ED Notes (Signed)
Telephone report to [facility]  

## 2022-10-14 NOTE — Progress Notes (Signed)
PT Cancellation Note  Patient Details Name: [name] MRN: [id number] DOB: April 16, 1941   Cancelled Treatment:    Reason Eval/Treat Not Completed: Fatigue limiting ability to participate. Pt fatigued after working with OT. Will defer eval until tomorrow.    [name] [facility] 10/14/2022, 3:02 PM Skip Mayer PT Acute [employer] [phone]

## 2022-10-14 NOTE — Progress Notes (Signed)
Initial Nutrition Assessment  DOCUMENTATION CODES:   Not applicable  INTERVENTION:   Multivitamin w/ minerals daily Liberalize pt diet to regular due to poor appetite and increased needs Encourage good PO Chopped meats  Ensure Enlive po BID, each supplement provides 350 kcal and 20 grams of protein. Recommend obtaining new weight  NUTRITION DIAGNOSIS:   Increased nutrient needs related to acute illness as evidenced by estimated needs.  GOAL:   Patient will meet greater than or equal to 90% of their needs   MONITOR:   PO intake, Supplement acceptance, Labs, Weight trends  REASON FOR ASSESSMENT:   Consult Assessment of nutrition requirement/status  ASSESSMENT:   81 y.o. female presented to the ED with chest pain and shortness of breath. PMH includes T2DM, A. Fib, stroke w/ L side hemiparesis, and HTN. Pt admitted with RSV bronchitis.  Pt sleeping, did not wake to RD. Daughter present and able to provide nutrition history. Daughter reports that pt appetite was good up until 2 days PTA, states that she does not eat a lot of protein due to dentition. Does not drink oral nutritional supplements regularly, but did have one the day PTA. Noted that she did have a loose stool after, explained they are suitable for lactose intolerance. Daughter agreeable to continue to have her sip on throughout the day, if continues to have difficulty will agree to switch to alternative supplement. Daughter reports that pt weight has been stable around 132#. Pt uses a walker to ambulate at home.   Discussed that RD will liberalize pt diet to regular due to decreased appetite. Daughter agreeable to call and order meals for pt.   No weight within chart, RD reached out to RN to obtain.   Medications reviewed and include: NovoLog SSI, Prednisone Labs reviewed: Hgb A1c 6.4% (10/18/20), 24 hr CBGs 135-189  NUTRITION - FOCUSED PHYSICAL EXAM:  Flowsheet Row Most Recent Value  Orbital Region No depletion   Upper Arm Region No depletion  Thoracic and Lumbar Region No depletion  Buccal Region No depletion  Temple Region No depletion  Clavicle Bone Region Mild depletion  Clavicle and Acromion Bone Region Mild depletion  Scapular Bone Region Mild depletion  Dorsal Hand No depletion  Patellar Region Mild depletion  Anterior Thigh Region Mild depletion  Posterior Calf Region Mild depletion  Edema (RD Assessment) None  Hair Reviewed  Eyes Unable to assess  Mouth Unable to assess  Skin Reviewed  Nails Reviewed   Diet Order:   Diet Order             Diet regular Room service appropriate? Yes; Fluid consistency: Thin  Diet effective now                   EDUCATION NEEDS:   Not appropriate for education at this time  Skin:  Skin Assessment: Reviewed RN Assessment  Last BM:  Unknown  Height:   Ht Readings from Last 1 Encounters:  12/09/21 4\' 8"  (1.422 m)    Weight:   Wt Readings from Last 1 Encounters:  12/09/21 61.2 kg   BMI:  There is no height or weight on file to calculate BMI.  Estimated Nutritional Needs:  Kcal:  1700-1900 Protein:  85-100 grams Fluid:  >/= 1.7 L    12/11/21 RD, LDN Clinical Dietitian See [facility] for contact information.

## 2022-10-14 NOTE — Assessment & Plan Note (Signed)
Only taking metoprolol per med rec Very hypertensive when in acute resp distress in ED, BP has improved without specific medication treatment though, now 126/76 at time of my evaluation.

## 2022-10-14 NOTE — Assessment & Plan Note (Signed)
Due to RSV bronchitis. Pt remains on BIPAP, improved on BIPAP.

## 2022-10-14 NOTE — Progress Notes (Signed)
Pt taken off BiPAP by RT per MD, and placed on 2L [place]. Pt tolerating well at this time, RN aware, MD aware, RT will monitor.

## 2022-10-14 NOTE — Assessment & Plan Note (Addendum)
Pt with respiratory distress, wheezing, fever, acute bronchitis. Found to be RSV positive on PCR. COPD pathway Scheduled LABA, LAMA, and INH steroid PRN SABA Solumedrol x1 dose followed by daily prednisone Wean BIPAP as able Start diet when able to be consistently off BIPAP, hopefully later this AM If not will need to start maint IVF Cont pulse ox Tele monitor

## 2022-10-14 NOTE — Assessment & Plan Note (Signed)
SSI mod scale Q4H for the moment.

## 2022-10-14 NOTE — H&P (Signed)
History and Physical    PatientDarlina [name]: [name] ZOX:[id number] DOB: 06/23/1941 DOA: 10/13/2022 DOS: the patient was seen and examined on 10/14/2022 PCP: Ermalinda Memosowlen, Hugh, MD  Patient coming from: Home  Chief Complaint:  Chief Complaint  Patient presents with   Chest Pain   Shortness of Breath   HPI: Lauren RumpfHue [name] is a 81 y.o. female with medical history significant of DM2, A.Fib on eliquis, prior stroke with residual L sided hemiparesis.  Review of Systems: As mentioned in the history of present illness. All other systems reviewed and are negative. Past Medical History:  Diagnosis Date   Atrial fibrillation (HCC)    Chronic back pain    "mid back down into lower back" (08/25/2014)   Diabetes mellitus without complication (HCC)    Frequent falls    GERD (gastroesophageal reflux disease)    Hypertriglyceridemia    OSA on CPAP    Osteoarthritis    "knees, hands, back" (08/25/2014)   Pneumonia ~ 2000 X 1   Subdural hematoma (HCC) july 2015   S/P fall while on Coumadin   T12 compression fracture (HCC) 2012   Type II diabetes mellitus (HCC)    Past Surgical History:  Procedure Laterality Date   APPENDECTOMY  2012   CATARACT EXTRACTION W/ INTRAOCULAR LENS  IMPLANT, BILATERAL Bilateral 2000's   IR GENERIC HISTORICAL  10/15/2016   IR PERCUTANEOUS ART THROMBECTOMY/INFUSION INTRACRANIAL INC DIAG ANGIO 10/15/2016 Lauren CottonSanjeev Deveshwar, MD [facility]   IR GENERIC HISTORICAL  12/13/2016   IR RADIOLOGIST EVAL & MGMT 12/13/2016 [facility]   RADIOLOGY WITH ANESTHESIA N/A 10/15/2016   Procedure: RADIOLOGY WITH ANESTHESIA;  Surgeon: Lauren CottonSanjeev Deveshwar, MD;  Location: [facility] OR;  Service: Radiology;  Laterality: N/A;   TOTAL ABDOMINAL HYSTERECTOMY  1990   Social History:  reports that she does not have a smoking history on file. She has been exposed to tobacco smoke. She has never used smokeless tobacco. She reports that she does not drink alcohol and does not use drugs.  Allergies  Allergen Reactions    Bactrim [Sulfamethoxazole-Trimethoprim] Cough    And flushing of face and conjunctivitis.    Bactrim [Sulfamethoxazole-Trimethoprim]     Family History  Problem Relation Age of Onset   Diabetes Mellitus II Neg Hx    Hypertension Neg Hx     Prior to Admission medications   Medication Sig Start Date End Date Taking? Authorizing Provider  acetaminophen (TYLENOL) 325 MG tablet Take 2 tablets (650 mg total) by mouth every 6 (six) hours as needed for mild pain, headache or fever. 11/09/20   Angiulli, Mcarthur Rossettianiel J, PA-C  apixaban (ELIQUIS) 5 MG TABS tablet Take 1 tablet (5 mg total) by mouth 2 (two) times daily. 11/09/20   Angiulli, Mcarthur Rossettianiel J, PA-C  Calcium Carbonate (CALCIUM-CARB 600 PO) Take 1 tablet by mouth daily. Gummy    [provider]  cetirizine (ZYRTEC) 10 MG tablet Take 10 mg daily as needed by mouth (seasonal allergies).    [provider]  Cholecalciferol (VITAMIN D) 50 MCG (2000 UT) tablet Take 1 tablet (2,000 Units total) by mouth daily. 11/09/20   Angiulli, Mcarthur Rossettianiel J, PA-C  DULoxetine (CYMBALTA) 30 MG capsule Take 30 mg by mouth at bedtime. 10/14/21   [provider]  fluticasone (FLONASE) 50 MCG/ACT nasal spray Place 1 spray daily as needed into both nostrils (seasonal allergies).  02/18/14   [provider]  FORTEO 600 MCG/2.4ML SOPN Inject 20 mcg into the skin at bedtime. 12/10/21   [place], Eric J, DO  HYDROcodone-acetaminophen (NORCO/VICODIN) 5-325 MG tablet Take 1 tablet by mouth every 6 (six) hours as needed for moderate pain.    [provider]  hydrocortisone 2.5 % cream Apply 1 application topically daily as needed (itching). 06/17/21   [provider]  lidocaine (LIDODERM) 5 % Place 2 patches onto the skin daily. Remove & Discard patch within 12 hours or as directed by MD Patient taking differently: Place 2 patches onto the skin daily as needed (back pain). Remove & Discard patch within 12 hours or as directed by MD 11/09/20    Angiulli, Mcarthur Rossetti, PA-C  metFORMIN (GLUCOPHAGE) 500 MG tablet Take 0.5 tablets (250 mg total) by mouth 2 (two) times daily with a meal. Patient taking differently: Take 500 mg by mouth 2 (two) times daily with a meal. 11/09/20   Angiulli, Mcarthur Rossetti, PA-C  metoprolol tartrate (LOPRESSOR) 25 MG tablet Take 0.5 tablets (12.5 mg total) by mouth 2 (two) times daily. 11/12/20   Azalee Course, PA  nitroGLYCERIN (NITROSTAT) 0.4 MG SL tablet Place 1 tablet (0.4 mg total) under the tongue every 5 (five) minutes as needed for chest pain. 11/09/20   Angiulli, Mcarthur Rossetti, PA-C  polyethylene glycol (MIRALAX / GLYCOLAX) 17 g packet Take 17 g by mouth daily as needed for mild constipation. 10/20/20   Glade Lloyd, MD  pravastatin (PRAVACHOL) 20 MG tablet Take 1 tablet (20 mg total) by mouth at bedtime. 11/09/20   Angiulli, Mcarthur Rossetti, PA-C  tiZANidine (ZANAFLEX) 4 MG tablet Take 2-4 mg by mouth at bedtime as needed for muscle spasms. 10/05/21   [provider]  traMADol (ULTRAM) 50 MG tablet Take 1 tablet (50 mg total) by mouth every 6 (six) hours as needed for moderate pain. 11/09/20   Angiulli, Mcarthur Rossetti, PA-C  vitamin k 100 MCG tablet Take 100 mcg by mouth daily.    [provider]    Physical Exam: Vitals:   10/14/22 0202 10/14/22 0205 10/14/22 0212 10/14/22 0400  BP:  114/79  126/72  Pulse: (!) 109 (!) 107  (!) 114  Resp: 14 20  14   Temp:  99.8 F (37.7 C)  99.5 F (37.5 C)  TempSrc:  Axillary  Axillary  SpO2: 100% 96% 100% 100%   Constitutional: Resting comfortably on BIPAP. Eyes: PERRL, lids and conjunctivae normal ENMT: Mucous membranes are moist. Posterior pharynx clear of any exudate or lesions.Normal dentition.  Neck: normal, supple, no masses, no thyromegaly Respiratory: Some expiratory wheezes, no longer having respiratory distress, tachypnea, nor accessory muscle use.  On BIPAP. Cardiovascular: Tachycardic, irr, irr Abdomen: no tenderness, no masses palpated. No hepatosplenomegaly.  Bowel sounds positive.  Musculoskeletal: no clubbing / cyanosis. No joint deformity upper and lower extremities. Good ROM, no contractures. Normal muscle tone.  Skin: no rashes, lesions, ulcers. No induration Neurologic: CN 2-12 grossly intact. Sensation intact, DTR normal. Strength 5/5 in all 4.  Psychiatric: Normal judgment and insight. Alert and oriented x 3. Normal mood.   Data Reviewed:       Latest Ref Rng & Units 10/13/2022    7:31 PM 12/10/2021    2:54 AM 12/09/2021    8:37 AM  CBC  WBC 4.0 - 10.5 K/uL 10.9  8.5  6.4   Hemoglobin 12.0 - 15.0 g/dL 12/11/2021  87.8  67.6   Hematocrit 36.0 - 46.0 % 36.4  35.8  35.3   Platelets 150 - 400 K/uL 339  327  328       Latest Ref Rng & Units  10/13/2022    7:31 PM 12/10/2021    2:54 AM 12/09/2021    8:37 AM  CMP  Glucose 70 - 99 mg/dL 261  125  139   BUN 8 - 23 mg/dL 17  17  14    Creatinine 0.44 - 1.00 mg/dL 0.76  0.68  0.76   Sodium 135 - 145 mmol/L 135  140  142   Potassium 3.5 - 5.1 mmol/L 4.0  4.5  4.3   Chloride 98 - 111 mmol/L 98  103  104   CO2 22 - 32 mmol/L 25  27  30    Calcium 8.9 - 10.3 mg/dL 9.5  9.5  9.9   Total Protein 6.5 - 8.1 g/dL   6.5   Total Bilirubin 0.3 - 1.2 mg/dL   0.4   Alkaline Phos 38 - 126 U/L   62   AST 15 - 41 U/L   15   ALT 0 - 44 U/L   11    COVID and flu are negative on PCR COVID antigen test earlier yesterday at [facility] is also negative. RSV PCR is positive  Trop 5 -> 6  BNP 273  CXR: IMPRESSION: 1. Pulmonary hypoinflation.   Assessment and Plan: * RSV bronchitis Pt with respiratory distress, wheezing, fever, acute bronchitis. Found to be RSV positive on PCR. COPD pathway Scheduled LABA, LAMA, and INH steroid PRN SABA Solumedrol x1 dose followed by daily prednisone Wean BIPAP as able Start diet when able to be consistently off BIPAP, hopefully later this AM If not will need to start maint IVF Cont pulse ox Tele monitor   Acute hypoxic respiratory failure (HCC) Due to RSV  bronchitis. Pt remains on BIPAP, improved on BIPAP.  Chronic atrial fibrillation (HCC) A.Fib, now in mild RVR with HR 110 secondary to.  Had been much worse earlier in evening when she had respiratory distress in ED (rates 150s) and was being given large amounts of inhaled B agonist therapy to try and help her breathe Tele monitor Resume home metoprolol PO with AM dose Cont home eliquis.  Type 2 diabetes mellitus with peripheral neuropathy (HCC) SSI mod scale Q4H for the moment.  Essential hypertension Only taking metoprolol per med rec Very hypertensive when in acute resp distress in ED, BP has improved without specific medication treatment though, now 126/76 at time of my evaluation.  Left hemiparesis ([place]) Chronic, due to prior stroke      Advance Care Planning:   Code Status: Full Code Confirmed with Daughter  Consults: None  Family Communication: Daughter at bedside  Severity of Illness: The appropriate patient status for this patient is INPATIENT. Inpatient status is judged to be reasonable and necessary in order to provide the required intensity of service to ensure the patient's safety. The patient's presenting symptoms, physical exam findings, and initial radiographic and laboratory data in the context of their chronic comorbidities is felt to place them at high risk for further clinical deterioration. Furthermore, it is not anticipated that the patient will be medically stable for discharge from the hospital within 2 midnights of admission.   * I certify that at the point of admission it is my clinical judgment that the patient will require inpatient hospital care spanning beyond 2 midnights from the point of admission due to high intensity of service, high risk for further deterioration and high frequency of surveillance required.*  Author: Etta Quill., DO 10/14/2022 4:59 AM  For on call review [email or web address].

## 2022-10-14 NOTE — Assessment & Plan Note (Signed)
Chronic, due to prior stroke

## 2022-10-14 NOTE — Progress Notes (Signed)
PROGRESS NOTE        PATIENT DETAILS Name: [name] Age: 81 y.o. Sex: female Date of Birth: 14-Mar-1941 Admit Date: 10/13/2022 Admitting Physician Darlin Drop, DO GEX:[id number], Jena Gauss, MD  Brief Summary: Patient is a 81 y.o.  female with history of prior CVA with mild left-sided hemiparesis, DM-2, atrial fibrillation who presented with 2-3 history of cough/wheezing/shortness of breath-she was found to have acute hypoxic respiratory failure due to RSV bronchitis and A-fib with RVR.  Significant events: 12/28>> admit to [facility]-severe hypoxic respiratory failure requiring BiPAP-RSV bronchitis.  Significant studies: 12/28>> CXR: No obvious pneumonia  Significant microbiology data: 12/28>> RSV PCR: Positive 12/28>> influenza/COVID PCR: Negative  Procedures: None  Consults: None  Subjective: Feels better-still on BiPAP-will attempt to liberate this morning.  Objective: Vitals: Blood pressure 106/64, pulse (!) 114, temperature 98.9 F (37.2 C), temperature source Axillary, resp. rate 14, SpO2 100 %.   Exam: Gen Exam:Alert awake-not in any distress HEENT:atraumatic, normocephalic Chest: Mostly clear to ask station. CVS:S1S2 regular Abdomen:soft non tender, non distended Extremities: Trace edema Neurology: Non focal Skin: no rash  Pertinent Labs/Radiology:    Latest Ref Rng & Units 10/14/2022    5:34 AM 10/13/2022    7:31 PM 12/10/2021    2:54 AM  CBC  WBC 4.0 - 10.5 K/uL 10.6  10.9  8.5   Hemoglobin 12.0 - 15.0 g/dL 24.4  01.0  27.2   Hematocrit 36.0 - 46.0 % 31.9  36.4  35.8   Platelets 150 - 400 K/uL 305  339  327     Lab Results  Component Value Date   NA 140 10/14/2022   K 3.9 10/14/2022   CL 103 10/14/2022   CO2 23 10/14/2022      Assessment/Plan: Acute hypoxic respiratory failure due to RSV bronchitis Overall improved-attempt to liberate off BiPAP today Continue IV steroids/bronchodilators/Mucinex/incentive spirometry/flutter  valve IV Lasix x 1 to ensure negative balance-although no signs of volume overload Check updated echo Mobilize with PT/OT  Chronic atrial fibrillation with RVR Mild RVR-rate in the low 100s Continue Lopressor Continue Eliquis Continue telemetry monitoring  History of CVA with mild left-sided hemiparesis Continue Eliquis PT/OT eval  DM-2 A1c ending CBGs relatively stable on SSI Continue to hold all oral hypoglycemic agents  Recent Labs    10/14/22 0535 10/14/22 0759  GLUCAP 135* 145*    Chronic back pain Continue Cymbalta Continue as needed Lidoderm patch/Tylenol Mobilize with PT/OT  Obesity: Estimated body mass index is 30.27 kg/m as calculated from the following:   Height as of 12/09/21: 4\' 8"  (1.422 m).   Weight as of 12/09/21: 61.2 kg.   Code status:   Code Status: Full Code   DVT Prophylaxis: apixaban (ELIQUIS) tablet 5 mg    Family Communication: Daughter at bedside-she is a [facility]/11/21 at [facility]   Disposition Plan: Status is: Inpatient Remains inpatient appropriate because: Severity of illness   Planned Discharge Destination:Home health   Diet: Diet Order             Diet heart healthy/carb modified Room service appropriate? Yes; Fluid consistency: Thin  Diet effective now                     Antimicrobial agents: Anti-infectives (From admission, onward)    None        MEDICATIONS: Scheduled  Meds:  apixaban  5 mg Oral BID   budesonide (PULMICORT) nebulizer solution  0.25 mg Nebulization BID   DULoxetine  30 mg Oral QHS   fluticasone  1 spray Each Nare Daily   furosemide  20 mg Intravenous Once   guaiFENesin  600 mg Oral BID   insulin aspart  0-15 Units Subcutaneous Q4H   levalbuterol  0.63 mg Nebulization Q6H   And   ipratropium  0.5 mg Nebulization Q6H   loratadine  10 mg Oral Daily   metoprolol tartrate  12.5 mg Oral BID   [START ON 10/15/2022] predniSONE  40 mg Oral Q breakfast   Continuous  Infusions: PRN Meds:.levalbuterol   I have personally reviewed following labs and imaging studies  LABORATORY DATA: CBC: Recent Labs  Lab 10/13/22 1931 10/14/22 0534  WBC 10.9* 10.6*  HGB 11.6* 10.0*  HCT 36.4 31.9*  MCV 105.5* 107.8*  PLT 339 305    Basic Metabolic Panel: Recent Labs  Lab 10/13/22 1931 10/14/22 0534  NA 135 140  K 4.0 3.9  CL 98 103  CO2 25 23  GLUCOSE 261* 139*  BUN 17 11  CREATININE 0.76 0.63  CALCIUM 9.5 8.7*    GFR: CrCl cannot be calculated (Unknown ideal weight.).  Liver Function Tests: No results for input(s): "AST", "ALT", "ALKPHOS", "BILITOT", "PROT", "ALBUMIN" in the last 168 hours. No results for input(s): "LIPASE", "AMYLASE" in the last 168 hours. No results for input(s): "AMMONIA" in the last 168 hours.  Coagulation Profile: No results for input(s): "INR", "PROTIME" in the last 168 hours.  Cardiac Enzymes: No results for input(s): "CKTOTAL", "CKMB", "CKMBINDEX", "TROPONINI" in the last 168 hours.  BNP (last 3 results) No results for input(s): "PROBNP" in the last 8760 hours.  Lipid Profile: No results for input(s): "CHOL", "HDL", "LDLCALC", "TRIG", "CHOLHDL", "LDLDIRECT" in the last 72 hours.  Thyroid Function Tests: No results for input(s): "TSH", "T4TOTAL", "FREET4", "T3FREE", "THYROIDAB" in the last 72 hours.  Anemia Panel: No results for input(s): "VITAMINB12", "FOLATE", "FERRITIN", "TIBC", "IRON", "RETICCTPCT" in the last 72 hours.  Urine analysis:    Component Value Date/Time   COLORURINE STRAW (A) 12/09/2021 2140   APPEARANCEUR CLEAR 12/09/2021 2140   LABSPEC 1.008 12/09/2021 2140   PHURINE 8.0 12/09/2021 2140   GLUCOSEU NEGATIVE 12/09/2021 2140   HGBUR SMALL (A) 12/09/2021 2140   BILIRUBINUR NEGATIVE 12/09/2021 2140   KETONESUR NEGATIVE 12/09/2021 2140   PROTEINUR NEGATIVE 12/09/2021 2140   UROBILINOGEN 0.2 02/11/2017 1601   NITRITE NEGATIVE 12/09/2021 2140   LEUKOCYTESUR TRACE (A) 12/09/2021 2140     Sepsis Labs: Lactic Acid, Venous    Component Value Date/Time   LATICACIDVEN 1.80 02/12/2018 2113    MICROBIOLOGY: Recent Results (from the past 240 hour(s))  Resp panel by RT-PCR (RSV, Flu A&B, Covid) Anterior Nasal Swab     Status: Abnormal   Collection Time: 10/13/22 10:03 PM   Specimen: Anterior Nasal Swab  Result Value Ref Range Status   SARS Coronavirus 2 by RT PCR NEGATIVE NEGATIVE Final    Comment: (NOTE) SARS-CoV-2 target nucleic acids are NOT DETECTED.  The SARS-CoV-2 RNA is generally detectable in upper respiratory specimens during the acute phase of infection. The lowest concentration of SARS-CoV-2 viral copies this assay can detect is 138 copies/mL. A negative result does not preclude SARS-Cov-2 infection and should not be used as the sole basis for treatment or other patient management decisions. A negative result may occur with  improper specimen collection/handling, submission of specimen other  than nasopharyngeal swab, presence of viral mutation(s) within the areas targeted by this assay, and inadequate number of viral copies(<138 copies/mL). A negative result must be combined with clinical observations, patient history, and epidemiological information. The expected result is Negative.  Fact Sheet for Patients:  BloggerCourse.com  Fact Sheet for Healthcare Providers:  SeriousBroker.it  This test is no t yet approved or cleared by the [place] FDA and  has been authorized for detection and/or diagnosis of SARS-CoV-2 by FDA under an Emergency Use Authorization (EUA). This EUA will remain  in effect (meaning this test can be used) for the duration of the COVID-19 declaration under Section 564(b)(1) of the Act, 21 U.S.C.section 360bbb-3(b)(1), unless the authorization is terminated  or revoked sooner.       Influenza A by PCR NEGATIVE NEGATIVE Final   Influenza B by PCR NEGATIVE NEGATIVE Final     Comment: (NOTE) The Xpert Xpress SARS-CoV-2/FLU/RSV plus assay is intended as an aid in the diagnosis of influenza from Nasopharyngeal swab specimens and should not be used as a sole basis for treatment. Nasal washings and aspirates are unacceptable for Xpert Xpress SARS-CoV-2/FLU/RSV testing.  Fact Sheet for Patients: BloggerCourse.com  Fact Sheet for Healthcare Providers: SeriousBroker.it  This test is not yet approved or cleared by the [place] FDA and has been authorized for detection and/or diagnosis of SARS-CoV-2 by FDA under an Emergency Use Authorization (EUA). This EUA will remain in effect (meaning this test can be used) for the duration of the COVID-19 declaration under Section 564(b)(1) of the Act, 21 U.S.C. section 360bbb-3(b)(1), unless the authorization is terminated or revoked.     Resp Syncytial Virus by PCR POSITIVE (A) NEGATIVE Final    Comment: (NOTE) Fact Sheet for Patients: BloggerCourse.com  Fact Sheet for Healthcare Providers: SeriousBroker.it  This test is not yet approved or cleared by the [place] FDA and has been authorized for detection and/or diagnosis of SARS-CoV-2 by FDA under an Emergency Use Authorization (EUA). This EUA will remain in effect (meaning this test can be used) for the duration of the COVID-19 declaration under Section 564(b)(1) of the Act, 21 U.S.C. section 360bbb-3(b)(1), unless the authorization is terminated or revoked.  Performed at [facility], [address], [place], [place] [address]     RADIOLOGY STUDIES/RESULTS: DG Chest Port 1 View  Result Date: 10/13/2022 CLINICAL DATA:  Dyspnea EXAM: PORTABLE CHEST 1 VIEW COMPARISON:  12/09/2021 FINDINGS: Lung volumes are small. Stable retrocardiac opacity likely reflecting parenchymal scarring in this region. No pneumothorax or pleural effusion.  Cardiac size is within normal limits when accounting for poor pulmonary insufflation, stable since prior examination. Pulmonary vascularity is normal. No acute bone abnormality. IMPRESSION: 1. Pulmonary hypoinflation. Electronically Signed   By: Helyn Numbers M.D.   On: 10/13/2022 20:12     LOS: 0 days   Jeoffrey Massed, MD  [facility]    To contact the attending provider between 7A-7P or the covering provider during after hours 7P-7A, please log into the web site [email or web address] and access using universal [place] password for that web site. If you do not have the password, please call the hospital operator.  10/14/2022, 8:59 AM

## 2022-10-14 NOTE — Progress Notes (Incomplete)
  Echocardiogram 2D Echocardiogram has been performed.  [name] 10/14/2022, 11:41 AM

## 2022-10-14 NOTE — Evaluation (Signed)
Occupational Therapy Evaluation Patient Details Name: [name] MRN: [id number] DOB: February 07, 1941 Today's Date: 10/14/2022   History of Present Illness 81 y.o.  female adm 12/28 with history of prior CVA with mild left-sided hemiparesis, DM-2, atrial fibrillation who presented with 2-3 history of cough/wheezing/shortness of breath-she was found to have acute hypoxic respiratory failure due to RSV bronchitis and A-fib with RVR   Clinical Impression   Patient admitted for the diagnosis above.  OT attempted to contact daughter, unable.  Prior home setup taken from prior admit.  Per 2 separate admits the patient lives with her daughter, walks with a RW, bates and dresses with supervision, and can help with meal prep.  Deficits impacting independence are listed below.  Currently she is needing up to Mod A for lower body ADL, and generalized Min A to Mod A for basic mobility.  Acute OT to follow in order to maximize her functional status.  OT will recommend HH OT, but contact with the family will need to be make to ensure capabilities of family.        Recommendations for follow up therapy are one component of a multi-disciplinary discharge planning process, led by the attending physician.  Recommendations may be updated based on patient status, additional functional criteria and insurance authorization.   Follow Up Recommendations  Home health OT     Assistance Recommended at Discharge Intermittent Supervision/Assistance  Patient can return home with the following Assist for transportation;Assistance with cooking/housework;A lot of help with bathing/dressing/bathroom;A lot of help with walking and/or transfers    Functional Status Assessment  Patient has had a recent decline in their functional status and demonstrates the ability to make significant improvements in function in a reasonable and predictable amount of time.  Equipment Recommendations  None recommended by OT    Recommendations for  Other Services       Precautions / Restrictions Precautions Precautions: Fall Precaution Comments: O2 sats and HR Restrictions Weight Bearing Restrictions: No      Mobility Bed Mobility Overal bed mobility: Needs Assistance Bed Mobility: Supine to Sit, Sit to Supine     Supine to sit: Mod assist Sit to supine: Mod assist   General bed mobility comments: assist with trunk and advance hips to sit, and assist with legs to lie down Patient Response: Cooperative  Transfers Overall transfer level: Needs assistance   Transfers: Sit to/from Stand Sit to Stand: Min assist                  Balance Overall balance assessment: Needs assistance Sitting-balance support: Feet supported Sitting balance-Leahy Scale: Fair     Standing balance support: Bilateral upper extremity supported Standing balance-Leahy Scale: Poor                             ADL either performed or assessed with clinical judgement   ADL Overall ADL's : Needs assistance/impaired     Grooming: Wash/dry hands;Wash/dry face;Set up;Sitting   Upper Body Bathing: Min guard;Sitting   Lower Body Bathing: Moderate assistance;Sit to/from stand   Upper Body Dressing : Minimal assistance;Sitting   Lower Body Dressing: Moderate assistance;Sit to/from stand                       Vision   Vision Assessment?: No apparent visual deficits     Perception     Praxis      Pertinent Vitals/Pain Pain Assessment Pain Assessment:  Faces Faces Pain Scale: Hurts a little bit Pain Location: chest Pain Descriptors / Indicators: Tightness Pain Intervention(s): Monitored during session     Hand Dominance Right   Extremity/Trunk Assessment Upper Extremity Assessment Upper Extremity Assessment: Overall WFL for tasks assessed   Lower Extremity Assessment Lower Extremity Assessment: Defer to PT evaluation   Cervical / Trunk Assessment Cervical / Trunk Assessment: Normal   Communication  Communication Communication: Prefers language other than [place];Other (comment)   Cognition Arousal/Alertness: Awake/alert Behavior During Therapy: WFL for tasks assessed/performed Overall Cognitive Status: Difficult to assess                                       General Comments   Fatigues easily.     Exercises     Shoulder Instructions      Home Living Family/patient expects to be discharged to:: Private residence Living Arrangements: Children Available Help at Discharge: Family;Available 24 hours/day Type of Home: House Home Access: Stairs to enter [employer] of Steps: 2 Entrance Stairs-Rails: Left Home Layout: Two level;Able to live on main level with bedroom/bathroom   Alternate Level Stairs-Rails: Right Bathroom Shower/Tub: Tub/shower unit   Bathroom Toilet: Handicapped height Bathroom Accessibility: Yes How Accessible: Accessible via walker Home Equipment: Tub bench;Rolling Walker (2 wheels);BSC/3in1;Other (comment);Toilet riser   Additional Comments: rail at bed      Prior Functioning/Environment Prior Level of Function : Needs assist             Mobility Comments: Per chart: typically uses RW ADLs Comments: Per chart: family does IADL, Pt manages her own diapers and will heat up food and sometimes do dishes - she sits to bathe with supervision for safety        OT Problem List: Impaired balance (sitting and/or standing);Decreased activity tolerance;Decreased strength      OT Treatment/Interventions: Self-care/ADL training;Therapeutic exercise;Therapeutic activities;Balance training;DME and/or AE instruction;Patient/family education    OT Goals(Current goals can be found in the care plan section) Acute Rehab OT Goals OT Goal Formulation: Patient unable to participate in goal setting Time For Goal Achievement: 10/28/22 Potential to Achieve Goals: Good ADL Goals Pt Will Perform Grooming: with supervision;standing Pt  Will Perform Lower Body Dressing: with supervision;sit to/from stand Pt Will Transfer to Toilet: ambulating;with supervision;regular height toilet Pt/caregiver will Perform Home Exercise Program: Increased strength;Both right and left upper extremity;With Supervision  OT Frequency: Min 2X/week    Co-evaluation              AM-PAC OT "6 Clicks" Daily Activity     Outcome Measure Help from another person eating meals?: A Little Help from another person taking care of personal grooming?: A Little Help from another person toileting, which includes using toliet, bedpan, or urinal?: A Lot Help from another person bathing (including washing, rinsing, drying)?: A Lot Help from another person to put on and taking off regular upper body clothing?: A Little Help from another person to put on and taking off regular lower body clothing?: A Lot 6 Click Score: 15   End of Session Equipment Utilized During Treatment: Gait belt;Rolling walker (2 wheels) Nurse Communication: Mobility status  Activity Tolerance: Patient limited by fatigue Patient left: in bed;with call bell/phone within reach;with bed alarm set  OT Visit Diagnosis: Unsteadiness on feet (R26.81);Muscle weakness (generalized) (M62.81)                Time: 7619-5093 OT  Time Calculation (min): 17 min Charges:  OT General Charges $OT Visit: 1 Visit OT Evaluation $OT Eval Moderate Complexity: 1 Mod  10/14/2022  RP, OTR/L  Acute Rehabilitation Services  Office:  [phone]   [name] 10/14/2022, 3:05 PM

## 2022-10-15 ENCOUNTER — Inpatient Hospital Stay (HOSPITAL_COMMUNITY): Payer: Medicare Other

## 2022-10-15 DIAGNOSIS — J205 Acute bronchitis due to respiratory syncytial virus: Secondary | ICD-10-CM | POA: Diagnosis not present

## 2022-10-15 LAB — CBC
HCT: 31.2 % — ABNORMAL LOW (ref 36.0–46.0)
Hemoglobin: 9.8 g/dL — ABNORMAL LOW (ref 12.0–15.0)
MCH: 33.8 pg (ref 26.0–34.0)
MCHC: 31.4 g/dL (ref 30.0–36.0)
MCV: 107.6 fL — ABNORMAL HIGH (ref 80.0–100.0)
Platelets: 322 10*3/uL (ref 150–400)
RBC: 2.9 MIL/uL — ABNORMAL LOW (ref 3.87–5.11)
RDW: 13.1 % (ref 11.5–15.5)
WBC: 9 10*3/uL (ref 4.0–10.5)
nRBC: 0 % (ref 0.0–0.2)

## 2022-10-15 LAB — HEMOGLOBIN A1C
Hgb A1c MFr Bld: 6.9 % — ABNORMAL HIGH (ref 4.8–5.6)
Mean Plasma Glucose: 151 mg/dL

## 2022-10-15 LAB — GLUCOSE, CAPILLARY
Glucose-Capillary: 141 mg/dL — ABNORMAL HIGH (ref 70–99)
Glucose-Capillary: 207 mg/dL — ABNORMAL HIGH (ref 70–99)
Glucose-Capillary: 192 mg/dL — ABNORMAL HIGH (ref 70–99)
Glucose-Capillary: 171 mg/dL — ABNORMAL HIGH (ref 70–99)

## 2022-10-15 LAB — BRAIN NATRIURETIC PEPTIDE: B Natriuretic Peptide: 395 pg/mL — ABNORMAL HIGH (ref 0.0–100.0)

## 2022-10-15 LAB — BASIC METABOLIC PANEL
Anion gap: 6 (ref 5–15)
BUN: 22 mg/dL (ref 8–23)
CO2: 27 mmol/L (ref 22–32)
Calcium: 8.9 mg/dL (ref 8.9–10.3)
Chloride: 106 mmol/L (ref 98–111)
Creatinine, Ser: 0.72 mg/dL (ref 0.44–1.00)
GFR, Estimated: >60 mL/min (ref >60)
Glucose, Bld: 178 mg/dL — ABNORMAL HIGH (ref 70–99)
Potassium: 4.6 mmol/L (ref 3.5–5.1)
Sodium: 139 mmol/L (ref 135–145)

## 2022-10-15 LAB — C-REACTIVE PROTEIN: CRP: 7.2 mg/dL — ABNORMAL HIGH (ref <1.0)

## 2022-10-15 MED ORDER — HYDROCOD POLI-CHLORPHE POLI ER 10-8 MG/5ML PO SUER: 5.0000 mL | Freq: Two times a day (BID) | ORAL | Status: DC | PRN

## 2022-10-15 MED ORDER — FUROSEMIDE 10 MG/ML IJ SOLN
40.0000 mg | Freq: Once | INTRAMUSCULAR | Status: AC
  Administered 2022-10-15: 40 mg via INTRAVENOUS
  Filled 2022-10-15: qty 4

## 2022-10-15 MED ORDER — METHYLPREDNISOLONE SODIUM SUCC 40 MG IJ SOLR: 40.0000 mg | Freq: Every day | INTRAMUSCULAR | Status: DC

## 2022-10-15 NOTE — Evaluation (Signed)
Physical Therapy Evaluation Patient Details Name: [name] MRN: [id number] DOB: [birth year] Today's Date: 10/15/2022  History of Present Illness  81 y.o.  female adm 12/28 with history of prior CVA with mild left-sided hemiparesis, DM-2, atrial fibrillation who presented with 2-3 history of cough/wheezing/shortness of breath-she was found to have acute hypoxic respiratory failure due to RSV bronchitis and A-fib with RVR  Clinical Impression  Pt admitted with above diagnosis and presents to PT with functional limitations due to deficits listed below (See PT problem list). Pt needs skilled PT to maximize independence and safety to allow discharge to home with family support. Pt able to amb in room and primarily limited by dyspnea/wheezing. Expect pt will progress with activity as medical status improves.         Recommendations for follow up therapy are one component of a multi-disciplinary discharge planning process, led by the attending physician.  Recommendations may be updated based on patient status, additional functional criteria and insurance authorization.  Follow Up Recommendations Home health PT      Assistance Recommended at Discharge Intermittent Supervision/Assistance  Patient can return home with the following  Help with stairs or ramp for entrance;A little help with walking and/or transfers    Equipment Recommendations None recommended by PT  Recommendations for Other Services       Functional Status Assessment Patient has had a recent decline in their functional status and demonstrates the ability to make significant improvements in function in a reasonable and predictable amount of time.     Precautions / Restrictions Precautions Precautions: Fall;Other (comment) Precaution Comments: watch HR Restrictions Weight Bearing Restrictions: No      Mobility  Bed Mobility               General bed mobility comments: Pt up in chair    Transfers Overall transfer  level: Needs assistance Equipment used: Rolling walker (2 wheels) Transfers: Sit to/from Stand Sit to Stand: Min assist, Min guard           General transfer comment: Min guard for safety from chair and min assist to bring hips up from low toilet    Ambulation/Gait Ambulation/Gait assistance: Min guard Gait Distance (Feet): 20 Feet (x 2) Assistive device: Rolling walker (2 wheels) Gait Pattern/deviations: Step-through pattern, Decreased stride length Gait velocity: decr Gait velocity interpretation: <1.31 ft/sec, indicative of household ambulator   General Gait Details: Assist for Armed forces logistics/support/administrative officer    Modified Rankin (Stroke Patients Only)       Balance Overall balance assessment: Needs assistance Sitting-balance support: No upper extremity supported, Feet supported Sitting balance-Leahy Scale: Good     Standing balance support: Single extremity supported Standing balance-Leahy Scale: Poor Standing balance comment: UE support and min guard for static standing                             Pertinent Vitals/Pain Pain Assessment Pain Assessment: No/denies pain    Home Living Family/patient expects to be discharged to:: Private residence Living Arrangements: Children Available Help at Discharge: Family;Available 24 hours/day Type of Home: House Home Access: Stairs to enter Entrance Stairs-Rails: Left Entrance Stairs-Number of Steps: 2   Home Layout: Two level;Able to live on main level with bedroom/bathroom Home Equipment: Tub bench;Rolling Walker (2 wheels);BSC/3in1;Other (comment);Toilet riser Additional Comments: rail at bed    Prior Function Prior Level of  Function : Needs assist       Physical Assist : ADLs (physical)   ADLs (physical): Bathing;Dressing Mobility Comments: Modified independent with rolling walker ADLs Comments: Per chart: family does IADL, Pt manages her own diapers and will heat  up food and sometimes do dishes - she sits to bathe with supervision for safety     Hand Dominance   Dominant Hand: Right    Extremity/Trunk Assessment   Upper Extremity Assessment Upper Extremity Assessment: Defer to OT evaluation    Lower Extremity Assessment Lower Extremity Assessment: Generalized weakness    Cervical / Trunk Assessment Cervical / Trunk Assessment: Normal  Communication   Communication: Prefers language other than [place] (son present and interpreted)  Cognition Arousal/Alertness: Awake/alert Behavior During Therapy: WFL for tasks assessed/performed Overall Cognitive Status: Within Functional Limits for tasks assessed                                          General Comments General comments (skin integrity, edema, etc.): Pt on 4L of O2. SpO2 >94% with all activity but audible wheezing present. HR to 124 with amb and coughing. Back down to 104 after sitting.    Exercises     Assessment/Plan    PT Assessment Patient needs continued PT services  PT Problem List Decreased strength;Decreased balance;Decreased mobility;Decreased activity tolerance;Cardiopulmonary status limiting activity       PT Treatment Interventions DME instruction;Functional mobility training;Balance training;Patient/family education;Gait training;Therapeutic activities;Therapeutic exercise    PT Goals (Current goals can be found in the Care Plan section)  Acute Rehab PT Goals Patient Stated Goal: go home PT Goal Formulation: With patient/family Time For Goal Achievement: 10/29/22 Potential to Achieve Goals: Good    Frequency Min 3X/week     Co-evaluation               AM-PAC PT "6 Clicks" Mobility  Outcome Measure Help needed turning from your back to your side while in a flat bed without using bedrails?: A Little Help needed moving from lying on your back to sitting on the side of a flat bed without using bedrails?: A Little Help needed moving to  and from a bed to a chair (including a wheelchair)?: A Little Help needed standing up from a chair using your arms (e.g., wheelchair or bedside chair)?: A Little Help needed to walk in hospital room?: A Little Help needed climbing 3-5 steps with a railing? : A Little 6 Click Score: 18    End of Session Equipment Utilized During Treatment: Oxygen Activity Tolerance: Patient tolerated treatment well Patient left: in chair;with call bell/phone within reach;with chair alarm set;with family/visitor present Nurse Communication: Mobility status PT Visit Diagnosis: Other abnormalities of gait and mobility (R26.89);Muscle weakness (generalized) (M62.81)    Time: 0867-6195 PT Time Calculation (min) (ACUTE ONLY): 20 min   Charges:   PT Evaluation $PT Eval Moderate Complexity: 1 Mod          [facility] PT Acute Rehabilitation Services Office [phone]   [name] [facility] 10/15/2022, 2:00 PM

## 2022-10-15 NOTE — Progress Notes (Signed)
PROGRESS NOTE        PATIENT DETAILS Name: [name] Age: 81 y.o. Sex: female Date of Birth: [birth year] Admit Date: 10/13/2022 Admitting Physician Kayleen Memos, DO OE:[id number], Camila Li, MD  Brief Summary: Patient is a 81 y.o.  female with history of prior CVA with mild left-sided hemiparesis, DM-2, atrial fibrillation who presented with 2-3 history of cough/wheezing/shortness of breath-she was found to have acute hypoxic respiratory failure due to RSV bronchitis and A-fib with RVR.  Significant events: 12/28>> admit to [facility]-severe hypoxic respiratory failure requiring BiPAP-RSV bronchitis.  Significant studies: 12/28>> CXR: No obvious pneumonia  Significant microbiology data: 12/28>> RSV PCR: Positive 12/28>> influenza/COVID PCR: Negative  Procedures: TTE  1. Left ventricular ejection fraction, by estimation, is 65 to 70%. The left ventricle has normal function. The left ventricle has no regional wall motion abnormalities. Left ventricular diastolic parameters are indeterminate. Elevated left ventricular end-diastolic pressure.  2. Right ventricular systolic function is normal. The right ventricular size is normal. There is normal pulmonary artery systolic pressure.  3. Left atrial size was severely dilated.  4. Right atrial size was moderately dilated.  5. The mitral valve is normal in structure. Mild mitral valve regurgitation. No evidence of mitral stenosis.  6. The aortic valve is tricuspid. There is mild calcification of the aortic valve. There is mild thickening of the aortic valve. Aortic valve regurgitation is trivial. Aortic valve sclerosis/calcification is present, without any evidence of aortic stenosis. Aortic regurgitation PHT measures 285 msec. Aortic valve area, by VTI measures 1.59 cm. Aortic valve mean gradient measures 7.0 mmHg. Aortic valve Vmax measures 1.74 m/s.  7. The inferior vena cava is normal in size with greater than 50% respiratory  variability, suggesting right atrial pressure of 3 mmHg  Consults: None  Subjective:  Patient in bed, appears comfortable, denies any headache, no fever, no chest pain or pressure, no shortness of breath , no abdominal pain. No new focal weakness.   Objective: Vitals: Blood pressure 119/76, pulse (!) 110, temperature 98.2 F (36.8 C), temperature source Axillary, resp. rate 19, SpO2 100 %.   Exam:  Awake Alert, No new F.N deficits, Normal affect [place].AT,PERRAL Supple Neck, No JVD,   Symmetrical Chest wall movement, Good air movement bilaterally, CTAB RRR,No Gallops, Rubs or new Murmurs,  +ve B.Sounds, Abd Soft, No tenderness,   No Cyanosis, Clubbing or edema    Assessment/Plan:  Acute hypoxic respiratory failure due to RSV bronchitis Overall improved-attempt to liberate off BiPAP today Continue IV steroids/bronchodilators/Mucinex/incentive spirometry/flutter valve IV Lasix x 1 to ensure negative balance-although no signs of volume overload Check updated echo Mobilize with PT/OT  Chronic atrial fibrillation with RVR Mild RVR-rate in the low 100s Continue Lopressor Continue Eliquis Continue telemetry monitoring  History of CVA with mild left-sided hemiparesis Continue Eliquis PT/OT eval  Chronic back pain Continue Cymbalta Continue as needed Lidoderm patch/Tylenol Mobilize with PT/OT  DM-2   CBGs relatively stable on SSI Continue to hold all oral hypoglycemic agents  CBG (last 3)  Recent Labs    10/14/22 1654 10/14/22 2113 10/15/22 0841  GLUCAP 146* 215* 141*   Lab Results  Component Value Date   HGBA1C 6.9 (H) 10/14/2022     Obesity: Estimated body mass index is 30.27 kg/m as calculated from the following:   Height as of 12/09/21: 4\' 8"  (1.422 m).   Weight as  of 12/09/21: 61.2 kg.   Code status:   Code Status: Full Code   DVT Prophylaxis: apixaban (ELIQUIS) tablet 5 mg    Family Communication: Daughter at bedside-she is a Software engineer at [employer]   Disposition Plan: Status is: Inpatient Remains inpatient appropriate because: Severity of illness   Planned Discharge Destination:Home health   Diet: Diet Order             Diet regular Room service appropriate? Yes; Fluid consistency: Thin  Diet effective now                     Antimicrobial agents: Anti-infectives (From admission, onward)    None        MEDICATIONS: Scheduled Meds:  apixaban  5 mg Oral BID   budesonide (PULMICORT) nebulizer solution  0.25 mg Nebulization BID   DULoxetine  30 mg Oral QHS   feeding supplement  237 mL Oral BID BM   fluticasone  1 spray Each Nare Daily   furosemide  40 mg Intravenous Once   guaiFENesin  600 mg Oral BID   insulin aspart  0-9 Units Subcutaneous TID WC   levalbuterol  0.63 mg Nebulization Q6H   And   ipratropium  0.5 mg Nebulization Q6H   loratadine  10 mg Oral Daily   methylPREDNISolone (SOLU-MEDROL) injection  40 mg Intravenous Q12H   metoprolol tartrate  12.5 mg Oral BID   multivitamin with minerals  1 tablet Oral Daily   Continuous Infusions: PRN Meds:.acetaminophen, guaiFENesin-dextromethorphan, levalbuterol, lidocaine   I have personally reviewed following labs and imaging studies  LABORATORY DATA:  Recent Labs  Lab 10/13/22 1931 10/14/22 0534 10/15/22 0226  WBC 10.9* 10.6* 9.0  HGB 11.6* 10.0* 9.8*  HCT 36.4 31.9* 31.2*  PLT 339 305 322  MCV 105.5* 107.8* 107.6*  MCH 33.6 33.8 33.8  MCHC 31.9 31.3 31.4  RDW 13.2 13.0 13.1    Recent Labs  Lab 10/13/22 1931 10/14/22 0534 10/15/22 0226  NA 135 140 139  K 4.0 3.9 4.6  CL 98 103 106  CO2 25 23 27   ANIONGAP 12 14 6   GLUCOSE 261* 139* 178*  BUN 17 11 22   CREATININE 0.76 0.63 0.72  CRP  --  7.2*  --   PROCALCITON  --  0.11  --   HGBA1C  --  6.9*  --   BNP 273.3*  --  395.0*  CALCIUM 9.5 8.7* 8.9      RADIOLOGY STUDIES/RESULTS: ECHOCARDIOGRAM COMPLETE  Result Date: 10/14/2022    ECHOCARDIOGRAM REPORT    Patient Name:   [name]   Date of Exam: 10/14/2022 Medical Rec #:  [id number]  Height:       56.0 in Accession #:    [id number] Weight:       135.0 lb Date of Birth:  May 18, 1941  BSA:          1.502 m Patient Age:    60 years   BP:           106/64 mmHg Patient Gender: F          HR:           88 bpm. Exam Location:  Inpatient Procedure: 2D Echo Indications:    dyspnea  History:        Patient has prior history of Echocardiogram examinations, most                 recent 10/17/2016. Arrythmias:Atrial Fibrillation;  Risk                 Factors:Dyslipidemia, Diabetes and Hypertension.  Sonographer:    Cathie Hoops Referring Phys: [phone] [facility]  Sonographer Comments: Technically difficult study due to poor echo windows, suboptimal parasternal window, suboptimal apical window and suboptimal subcostal window. Image acquisition challenging due to patient body habitus, Image acquisition challenging due to respiratory motion and supine. IMPRESSIONS  1. Left ventricular ejection fraction, by estimation, is 65 to 70%. The left ventricle has normal function. The left ventricle has no regional wall motion abnormalities. Left ventricular diastolic parameters are indeterminate. Elevated left ventricular end-diastolic pressure.  2. Right ventricular systolic function is normal. The right ventricular size is normal. There is normal pulmonary artery systolic pressure.  3. Left atrial size was severely dilated.  4. Right atrial size was moderately dilated.  5. The mitral valve is normal in structure. Mild mitral valve regurgitation. No evidence of mitral stenosis.  6. The aortic valve is tricuspid. There is mild calcification of the aortic valve. There is mild thickening of the aortic valve. Aortic valve regurgitation is trivial. Aortic valve sclerosis/calcification is present, without any evidence of aortic stenosis. Aortic regurgitation PHT measures 285 msec. Aortic valve area, by VTI measures 1.59 cm. Aortic valve mean  gradient measures 7.0 mmHg. Aortic valve Vmax measures 1.74 m/s.  7. The inferior vena cava is normal in size with greater than 50% respiratory variability, suggesting right atrial pressure of 3 mmHg. FINDINGS  Left Ventricle: Left ventricular ejection fraction, by estimation, is 65 to 70%. The left ventricle has normal function. The left ventricle has no regional wall motion abnormalities. The left ventricular internal cavity size was normal in size. There is  no left ventricular hypertrophy. Left ventricular diastolic parameters are indeterminate. Elevated left ventricular end-diastolic pressure. Right Ventricle: The right ventricular size is normal. No increase in right ventricular wall thickness. Right ventricular systolic function is normal. There is normal pulmonary artery systolic pressure. The tricuspid regurgitant velocity is 2.58 m/s, and  with an assumed right atrial pressure of 3 mmHg, the estimated right ventricular systolic pressure is 29.6 mmHg. Left Atrium: Left atrial size was severely dilated. Right Atrium: Right atrial size was moderately dilated. Pericardium: There is no evidence of pericardial effusion. Mitral Valve: The mitral valve is normal in structure. Mild to moderate mitral annular calcification. Mild mitral valve regurgitation. No evidence of mitral valve stenosis. Tricuspid Valve: The tricuspid valve is normal in structure. Tricuspid valve regurgitation is trivial. No evidence of tricuspid stenosis. Aortic Valve: The aortic valve is tricuspid. There is mild calcification of the aortic valve. There is mild thickening of the aortic valve. Aortic valve regurgitation is trivial. Aortic regurgitation PHT measures 285 msec. Aortic valve sclerosis/calcification is present, without any evidence of aortic stenosis. Aortic valve mean gradient measures 7.0 mmHg. Aortic valve peak gradient measures 12.2 mmHg. Aortic valve area, by VTI measures 1.59 cm. Pulmonic Valve: The pulmonic valve was normal  in structure. Pulmonic valve regurgitation is mild. No evidence of pulmonic stenosis. Aorta: The aortic root is normal in size and structure. Venous: The inferior vena cava is normal in size with greater than 50% respiratory variability, suggesting right atrial pressure of 3 mmHg. IAS/Shunts: No atrial level shunt detected by color flow Doppler.  LEFT VENTRICLE PLAX 2D LVIDd:         3.15 cm     Diastology LVIDs:         2.35 cm  LV e' medial:    5.87 cm/s LV PW:         1.00 cm     LV E/e' medial:  18.9 LV IVS:        0.85 cm     LV e' lateral:   9.14 cm/s LVOT diam:     1.80 cm     LV E/e' lateral: 12.2 LV SV:         52 LV SV Index:   34 LVOT Area:     2.54 cm  LV Volumes (MOD) LV vol d, MOD A2C: 71.6 ml LV vol d, MOD A4C: 40.3 ml LV vol s, MOD A2C: 31.5 ml LV vol s, MOD A4C: 12.1 ml LV SV MOD A2C:     40.1 ml LV SV MOD A4C:     40.3 ml LV SV MOD BP:      34.5 ml RIGHT VENTRICLE RV Basal diam:  4.50 cm RV S prime:     12.85 cm/s TAPSE (M-mode): 1.3 cm LEFT ATRIUM             Index        RIGHT ATRIUM           Index LA diam:        3.40 cm 2.26 cm/m   RA Area:     19.80 cm LA Vol (A2C):   71.1 ml 47.33 ml/m  RA Volume:   53.40 ml  35.55 ml/m LA Vol (A4C):   64.6 ml 43.01 ml/m LA Biplane Vol: 72.2 ml 48.07 ml/m  AORTIC VALVE                     PULMONIC VALVE AV Area (Vmax):    1.64 cm      PV Vmax:          1.21 m/s AV Area (Vmean):   1.60 cm      PV Peak grad:     5.9 mmHg AV Area (VTI):     1.59 cm      PR End Diast Vel: 12.25 msec AV Vmax:           174.40 cm/s AV Vmean:          124.800 cm/s AV VTI:            0.323 m AV Peak Grad:      12.2 mmHg AV Mean Grad:      7.0 mmHg LVOT Vmax:         112.20 cm/s LVOT Vmean:        78.660 cm/s LVOT VTI:          0.202 m LVOT/AV VTI ratio: 0.63 AI PHT:            285 msec  AORTA Ao Root diam: 3.00 cm MITRAL VALVE                  TRICUSPID VALVE MV Area (PHT): 3.46 cm       TR Peak grad:   26.6 mmHg MV Decel Time: 219 msec       TR Vmax:        258.00  cm/s MR Peak grad:    79.6 mmHg MR Mean grad:    69.0 mmHg    SHUNTS MR Vmax:         446.00 cm/s  Systemic VTI:  0.20 m MR Vmean:        389.0 cm/s   Systemic Diam:  1.80 cm MR PISA:         6.28 cm MR PISA Eff ROA: 33 mm MR PISA Radius:  1.00 cm MV E velocity: 111.20 cm/s Skeet Latch MD Electronically signed by Skeet Latch MD Signature Date/Time: 10/14/2022/6:11:19 PM    Final    DG Chest Port 1 View  Result Date: 10/13/2022 CLINICAL DATA:  Dyspnea EXAM: PORTABLE CHEST 1 VIEW COMPARISON:  12/09/2021 FINDINGS: Lung volumes are small. Stable retrocardiac opacity likely reflecting parenchymal scarring in this region. No pneumothorax or pleural effusion. Cardiac size is within normal limits when accounting for poor pulmonary insufflation, stable since prior examination. Pulmonary vascularity is normal. No acute bone abnormality. IMPRESSION: 1. Pulmonary hypoinflation. Electronically Signed   By: Fidela Salisbury M.D.   On: 10/13/2022 20:12     LOS: 1 day   Signature  -    Lala Lund M.D on 10/15/2022 at 10:34 AM   -  To page go to [email or web address]

## 2022-10-16 DIAGNOSIS — J205 Acute bronchitis due to respiratory syncytial virus: Secondary | ICD-10-CM | POA: Diagnosis not present

## 2022-10-16 LAB — CBC WITH DIFFERENTIAL/PLATELET
Abs Immature Granulocytes: 0.23 10*3/uL — ABNORMAL HIGH (ref 0.00–0.07)
Basophils Absolute: 0.1 10*3/uL (ref 0.0–0.1)
Basophils Relative: 0 %
Eosinophils Absolute: 0 10*3/uL (ref 0.0–0.5)
Eosinophils Relative: 0 %
HCT: 32.7 % — ABNORMAL LOW (ref 36.0–46.0)
Hemoglobin: 10.6 g/dL — ABNORMAL LOW (ref 12.0–15.0)
Immature Granulocytes: 2 %
Lymphocytes Relative: 8 %
Lymphs Abs: 1 10*3/uL (ref 0.7–4.0)
MCH: 34.5 pg — ABNORMAL HIGH (ref 26.0–34.0)
MCHC: 32.4 g/dL (ref 30.0–36.0)
MCV: 106.5 fL — ABNORMAL HIGH (ref 80.0–100.0)
Monocytes Absolute: 1 10*3/uL (ref 0.1–1.0)
Monocytes Relative: 8 %
Neutro Abs: 9.9 10*3/uL — ABNORMAL HIGH (ref 1.7–7.7)
Neutrophils Relative %: 82 %
Platelets: 345 10*3/uL (ref 150–400)
RBC: 3.07 MIL/uL — ABNORMAL LOW (ref 3.87–5.11)
RDW: 12.9 % (ref 11.5–15.5)
WBC: 12.2 10*3/uL — ABNORMAL HIGH (ref 4.0–10.5)
nRBC: 0.2 % (ref 0.0–0.2)

## 2022-10-16 LAB — BASIC METABOLIC PANEL
Anion gap: 8 (ref 5–15)
BUN: 34 mg/dL — ABNORMAL HIGH (ref 8–23)
CO2: 30 mmol/L (ref 22–32)
Calcium: 9.1 mg/dL (ref 8.9–10.3)
Chloride: 101 mmol/L (ref 98–111)
Creatinine, Ser: 0.9 mg/dL (ref 0.44–1.00)
GFR, Estimated: >60 mL/min (ref >60)
Glucose, Bld: 122 mg/dL — ABNORMAL HIGH (ref 70–99)
Potassium: 4.7 mmol/L (ref 3.5–5.1)
Sodium: 139 mmol/L (ref 135–145)

## 2022-10-16 LAB — GLUCOSE, CAPILLARY
Glucose-Capillary: 186 mg/dL — ABNORMAL HIGH (ref 70–99)
Glucose-Capillary: 197 mg/dL — ABNORMAL HIGH (ref 70–99)
Glucose-Capillary: 246 mg/dL — ABNORMAL HIGH (ref 70–99)
Glucose-Capillary: 181 mg/dL — ABNORMAL HIGH (ref 70–99)

## 2022-10-16 LAB — BRAIN NATRIURETIC PEPTIDE: B Natriuretic Peptide: 288.3 pg/mL — ABNORMAL HIGH (ref 0.0–100.0)

## 2022-10-16 LAB — MAGNESIUM: Magnesium: 2.2 mg/dL (ref 1.7–2.4)

## 2022-10-16 LAB — C-REACTIVE PROTEIN: CRP: 1.8 mg/dL — ABNORMAL HIGH (ref <1.0)

## 2022-10-16 MED ORDER — DILTIAZEM HCL 25 MG/5ML IV SOLN: 10.0000 mg | Freq: Four times a day (QID) | INTRAVENOUS | Status: DC | PRN

## 2022-10-16 MED ORDER — ALUM & MAG HYDROXIDE-SIMETH 200-200-20 MG/5ML PO SUSP
60.0000 mL | Freq: Once | ORAL | Status: AC
  Administered 2022-10-16: 60 mL via ORAL
  Filled 2022-10-16: qty 60

## 2022-10-16 MED ORDER — FUROSEMIDE 10 MG/ML IJ SOLN
40.0000 mg | Freq: Once | INTRAMUSCULAR | Status: AC
  Administered 2022-10-16: 40 mg via INTRAVENOUS
  Filled 2022-10-16: qty 4

## 2022-10-16 MED ORDER — PANTOPRAZOLE SODIUM 40 MG PO TBEC
40.0000 mg | DELAYED_RELEASE_TABLET | Freq: Every day | ORAL | Status: DC
  Administered 2022-10-16 – 2022-10-17 (×2): 40 mg via ORAL
  Filled 2022-10-16 (×2): qty 1

## 2022-10-16 MED ORDER — METOPROLOL TARTRATE 50 MG PO TABS
50.0000 mg | ORAL_TABLET | Freq: Two times a day (BID) | ORAL | Status: DC
  Administered 2022-10-16 – 2022-10-17 (×2): 50 mg via ORAL
  Filled 2022-10-16 (×3): qty 1

## 2022-10-16 MED ORDER — NITROGLYCERIN 0.4 MG SL SUBL: 0.4000 mg | SUBLINGUAL_TABLET | SUBLINGUAL | Status: DC | PRN

## 2022-10-16 MED ORDER — POLYETHYLENE GLYCOL 3350 17 G PO PACK: 17.0000 g | PACK | Freq: Every day | ORAL | Status: DC | PRN

## 2022-10-16 MED ORDER — ALUM & MAG HYDROXIDE-SIMETH 200-200-20 MG/5ML PO SUSP: 30.0000 mL | Freq: Four times a day (QID) | ORAL | Status: DC | PRN

## 2022-10-16 MED ORDER — PRAVASTATIN SODIUM 10 MG PO TABS
20.0000 mg | ORAL_TABLET | Freq: Every day | ORAL | Status: DC
  Administered 2022-10-16: 20 mg via ORAL
  Filled 2022-10-16: qty 2

## 2022-10-16 MED ORDER — METHYLPREDNISOLONE SODIUM SUCC 40 MG IJ SOLR
30.0000 mg | Freq: Every day | INTRAMUSCULAR | Status: DC
  Administered 2022-10-16: 30 mg via INTRAVENOUS
  Filled 2022-10-16: qty 1

## 2022-10-16 NOTE — Progress Notes (Signed)
Pt is resting on 2l [place] no distress noted at this time. Vitals stable.

## 2022-10-16 NOTE — Progress Notes (Signed)
PROGRESS NOTE        PATIENT DETAILS Name: [name] Age: 81 y.o. Sex: female Date of Birth: 09-Apr-1941 Admit Date: 10/13/2022 Admitting Physician Darlin Drop, DO GYJ:EHUDJS, Jena Gauss, MD  Brief Summary: Patient is a 81 y.o.  female with history of prior CVA with mild left-sided hemiparesis, DM-2, atrial fibrillation who presented with 2-3 history of cough/wheezing/shortness of breath-she was found to have acute hypoxic respiratory failure due to RSV bronchitis and A-fib with RVR.  Significant events: 12/28>> admit to [facility]-severe hypoxic respiratory failure requiring BiPAP-RSV bronchitis.  Significant studies: 12/28>> CXR: No obvious pneumonia  Significant microbiology data: 12/28>> RSV PCR: Positive 12/28>> influenza/COVID PCR: Negative  Procedures: TTE  1. Left ventricular ejection fraction, by estimation, is 65 to 70%. The left ventricle has normal function. The left ventricle has no regional wall motion abnormalities. Left ventricular diastolic parameters are indeterminate. Elevated left ventricular end-diastolic pressure.  2. Right ventricular systolic function is normal. The right ventricular size is normal. There is normal pulmonary artery systolic pressure.  3. Left atrial size was severely dilated.  4. Right atrial size was moderately dilated.  5. The mitral valve is normal in structure. Mild mitral valve regurgitation. No evidence of mitral stenosis.  6. The aortic valve is tricuspid. There is mild calcification of the aortic valve. There is mild thickening of the aortic valve. Aortic valve regurgitation is trivial. Aortic valve sclerosis/calcification is present, without any evidence of aortic stenosis. Aortic regurgitation PHT measures 285 msec. Aortic valve area, by VTI measures 1.59 cm. Aortic valve mean gradient measures 7.0 mmHg. Aortic valve Vmax measures 1.74 m/s.  7. The inferior vena cava is normal in size with greater than 50% respiratory  variability, suggesting right atrial pressure of 3 mmHg  Consults: None  Subjective:  Patient in bed, appears comfortable, denies any headache, no fever, no chest pain or pressure, no shortness of breath , no abdominal pain. No new focal weakness.   Objective: Vitals: Blood pressure (!) 133/91, pulse (!) 137, temperature 97.7 F (36.5 C), temperature source Oral, resp. rate 17, height 4\' 8"  (1.422 m), weight 60 kg, SpO2 100 %.   Exam:  Awake Alert, No new F.N deficits, Normal affect [place].AT,PERRAL Supple Neck, No JVD,   Symmetrical Chest wall movement, Good air movement bilaterally, few rales RRR,No Gallops, Rubs or new Murmurs,  +ve B.Sounds, Abd Soft, No tenderness,   No Cyanosis, Clubbing or edema    Assessment/Plan:  Acute hypoxic respiratory failure due to RSV bronchitis - Continue tapering steroids gradually improving, now down to 2 L nasal cannula oxygen and liberated off of BiPAP, diurese as needed, stable echocardiogram.  Increase activity prepare for discharge in the next 1 to 2 days.  Chronic atrial fibrillation with RVR vas 2 score of greater than 4. Continue Eliquis for anticoagulation Lopressor dose adjusted on 10/16/2022 for better rate control, as needed IV Cardizem also added.  History of CVA with mild left-sided hemiparesis Continue Eliquis PT/OT eval  Chronic back pain Continue Cymbalta Continue as needed Lidoderm patch/Tylenol Mobilize with PT/OT  Epigastric pain morning of 10/16/2022.  Relieved with Maalox.  EKG stable.  PPI added, continue to monitor.    DM-2   CBGs relatively stable on SSI Continue to hold all oral hypoglycemic agents  CBG (last 3)  Recent Labs    10/15/22 1133 10/15/22 1511 10/15/22  2123  GLUCAP 207* 192* 171*   Lab Results  Component Value Date   HGBA1C 6.9 (H) 10/14/2022     Obesity: Estimated body mass index is 29.66 kg/m as calculated from the following:   Height as of this encounter: 4\' 8"  (1.422 m).    Weight as of this encounter: 60 kg.   Code status:   Code Status: Full Code   DVT Prophylaxis: apixaban (ELIQUIS) tablet 5 mg    Family Communication: Daughter at bedside-she is a at [employer]   Disposition Plan: Status is: Inpatient Remains inpatient appropriate because: Severity of illness   Planned Discharge Destination:Home health   Diet: Diet Order             Diet regular Room service appropriate? Yes; Fluid consistency: Thin  Diet effective now                     Antimicrobial agents: Anti-infectives (From admission, onward)    None        MEDICATIONS: Scheduled Meds:  apixaban  5 mg Oral BID   budesonide (PULMICORT) nebulizer solution  0.25 mg Nebulization BID   DULoxetine  30 mg Oral QHS   feeding supplement  237 mL Oral BID BM   fluticasone  1 spray Each Nare Daily   guaiFENesin  600 mg Oral BID   insulin aspart  0-9 Units Subcutaneous TID WC   levalbuterol  0.63 mg Nebulization Q6H   And   ipratropium  0.5 mg Nebulization Q6H   loratadine  10 mg Oral Daily   methylPREDNISolone (SOLU-MEDROL) injection  30 mg Intravenous Daily   metoprolol tartrate  50 mg Oral BID   multivitamin with minerals  1 tablet Oral Daily   pravastatin  20 mg Oral QHS   Continuous Infusions: PRN Meds:.acetaminophen, alum & mag hydroxide-simeth, chlorpheniramine-HYDROcodone, diltiazem, guaiFENesin-dextromethorphan, levalbuterol, lidocaine, nitroGLYCERIN, polyethylene glycol   I have personally reviewed following labs and imaging studies  LABORATORY DATA:  Recent Labs  Lab 10/13/22 1931 10/14/22 0534 10/15/22 0226 10/16/22 0255  WBC 10.9* 10.6* 9.0 12.2*  HGB 11.6* 10.0* 9.8* 10.6*  HCT 36.4 31.9* 31.2* 32.7*  PLT 339 305 322 345  MCV 105.5* 107.8* 107.6* 106.5*  MCH 33.6 33.8 33.8 34.5*  MCHC 31.9 31.3 31.4 32.4  RDW 13.2 13.0 13.1 12.9  LYMPHSABS  --   --   --  1.0  MONOABS  --   --   --  1.0  EOSABS  --   --   --  0.0   BASOSABS  --   --   --  0.1    Recent Labs  Lab 10/13/22 1931 10/14/22 0534 10/15/22 0226 10/16/22 0255  NA 135 140 139 139  K 4.0 3.9 4.6 4.7  CL 98 103 106 101  CO2 25 23 27 30   ANIONGAP 12 14 6 8   GLUCOSE 261* 139* 178* 122*  BUN 17 11 22  34*  CREATININE 0.76 0.63 0.72 0.90  CRP  --  7.2*  --  1.8*  PROCALCITON  --  0.11  --   --   HGBA1C  --  6.9*  --   --   BNP 273.3*  --  395.0* 288.3*  MG  --   --   --  2.2  CALCIUM 9.5 8.7* 8.9 9.1      RADIOLOGY STUDIES/RESULTS: DG Chest Port 1 View  Result Date: 10/15/2022 CLINICAL DATA:  Shortness of breath and chest pain  EXAM: PORTABLE CHEST 1 VIEW COMPARISON:  10/13/2022 and prior studies FINDINGS: This is a low volume study. Cardiomediastinal silhouette is unchanged. Mild increased opacity in the perihilar regions noted which may be related to atelectasis or mild central edema. LEFT basilar atelectasis again noted. There is no evidence of pneumothorax or large pleural effusion. IMPRESSION: Low volume study with mild increased perihilar opacity which may be related to atelectasis or mild central edema. Electronically Signed   By: Harmon Pier M.D.   On: 10/15/2022 11:58   ECHOCARDIOGRAM COMPLETE  Result Date: 10/14/2022    ECHOCARDIOGRAM REPORT   Patient Name:   [name]   Date of Exam: 10/14/2022 Medical Rec #:  [id number]  Height:       56.0 in Accession #:    [id number] Weight:       135.0 lb Date of Birth:  [birth year]  BSA:          1.502 m Patient Age:    81 years   BP:           106/64 mmHg Patient Gender: F          HR:           88 bpm. Exam Location:  Inpatient Procedure: 2D Echo Indications:    dyspnea  History:        Patient has prior history of Echocardiogram examinations, most                 recent 10/17/2016. Arrythmias:Atrial Fibrillation; Risk                 Factors:Dyslipidemia, Diabetes and Hypertension.  Sonographer:    Cathie Hoops Referring Phys: [id number] [facility]  Sonographer Comments: Technically difficult  study due to poor echo windows, suboptimal parasternal window, suboptimal apical window and suboptimal subcostal window. Image acquisition challenging due to patient body habitus, Image acquisition challenging due to respiratory motion and supine. IMPRESSIONS  1. Left ventricular ejection fraction, by estimation, is 65 to 70%. The left ventricle has normal function. The left ventricle has no regional wall motion abnormalities. Left ventricular diastolic parameters are indeterminate. Elevated left ventricular end-diastolic pressure.  2. Right ventricular systolic function is normal. The right ventricular size is normal. There is normal pulmonary artery systolic pressure.  3. Left atrial size was severely dilated.  4. Right atrial size was moderately dilated.  5. The mitral valve is normal in structure. Mild mitral valve regurgitation. No evidence of mitral stenosis.  6. The aortic valve is tricuspid. There is mild calcification of the aortic valve. There is mild thickening of the aortic valve. Aortic valve regurgitation is trivial. Aortic valve sclerosis/calcification is present, without any evidence of aortic stenosis. Aortic regurgitation PHT measures 285 msec. Aortic valve area, by VTI measures 1.59 cm. Aortic valve mean gradient measures 7.0 mmHg. Aortic valve Vmax measures 1.74 m/s.  7. The inferior vena cava is normal in size with greater than 50% respiratory variability, suggesting right atrial pressure of 3 mmHg. FINDINGS  Left Ventricle: Left ventricular ejection fraction, by estimation, is 65 to 70%. The left ventricle has normal function. The left ventricle has no regional wall motion abnormalities. The left ventricular internal cavity size was normal in size. There is  no left ventricular hypertrophy. Left ventricular diastolic parameters are indeterminate. Elevated left ventricular end-diastolic pressure. Right Ventricle: The right ventricular size is normal. No increase in right ventricular wall  thickness. Right ventricular systolic function is normal. There is normal pulmonary artery systolic  pressure. The tricuspid regurgitant velocity is 2.58 m/s, and  with an assumed right atrial pressure of 3 mmHg, the estimated right ventricular systolic pressure is 29.6 mmHg. Left Atrium: Left atrial size was severely dilated. Right Atrium: Right atrial size was moderately dilated. Pericardium: There is no evidence of pericardial effusion. Mitral Valve: The mitral valve is normal in structure. Mild to moderate mitral annular calcification. Mild mitral valve regurgitation. No evidence of mitral valve stenosis. Tricuspid Valve: The tricuspid valve is normal in structure. Tricuspid valve regurgitation is trivial. No evidence of tricuspid stenosis. Aortic Valve: The aortic valve is tricuspid. There is mild calcification of the aortic valve. There is mild thickening of the aortic valve. Aortic valve regurgitation is trivial. Aortic regurgitation PHT measures 285 msec. Aortic valve sclerosis/calcification is present, without any evidence of aortic stenosis. Aortic valve mean gradient measures 7.0 mmHg. Aortic valve peak gradient measures 12.2 mmHg. Aortic valve area, by VTI measures 1.59 cm. Pulmonic Valve: The pulmonic valve was normal in structure. Pulmonic valve regurgitation is mild. No evidence of pulmonic stenosis. Aorta: The aortic root is normal in size and structure. Venous: The inferior vena cava is normal in size with greater than 50% respiratory variability, suggesting right atrial pressure of 3 mmHg. IAS/Shunts: No atrial level shunt detected by color flow Doppler.  LEFT VENTRICLE PLAX 2D LVIDd:         3.15 cm     Diastology LVIDs:         2.35 cm     LV e' medial:    5.87 cm/s LV PW:         1.00 cm     LV E/e' medial:  18.9 LV IVS:        0.85 cm     LV e' lateral:   9.14 cm/s LVOT diam:     1.80 cm     LV E/e' lateral: 12.2 LV SV:         52 LV SV Index:   34 LVOT Area:     2.54 cm  LV Volumes (MOD) LV  vol d, MOD A2C: 71.6 ml LV vol d, MOD A4C: 40.3 ml LV vol s, MOD A2C: 31.5 ml LV vol s, MOD A4C: 12.1 ml LV SV MOD A2C:     40.1 ml LV SV MOD A4C:     40.3 ml LV SV MOD BP:      34.5 ml RIGHT VENTRICLE RV Basal diam:  4.50 cm RV S prime:     12.85 cm/s TAPSE (M-mode): 1.3 cm LEFT ATRIUM             Index        RIGHT ATRIUM           Index LA diam:        3.40 cm 2.26 cm/m   RA Area:     19.80 cm LA Vol (A2C):   71.1 ml 47.33 ml/m  RA Volume:   53.40 ml  35.55 ml/m LA Vol (A4C):   64.6 ml 43.01 ml/m LA Biplane Vol: 72.2 ml 48.07 ml/m  AORTIC VALVE                     PULMONIC VALVE AV Area (Vmax):    1.64 cm      PV Vmax:          1.21 m/s AV Area (Vmean):   1.60 cm      PV Peak grad:  5.9 mmHg AV Area (VTI):     1.59 cm      PR End Diast Vel: 12.25 msec AV Vmax:           174.40 cm/s AV Vmean:          124.800 cm/s AV VTI:            0.323 m AV Peak Grad:      12.2 mmHg AV Mean Grad:      7.0 mmHg LVOT Vmax:         112.20 cm/s LVOT Vmean:        78.660 cm/s LVOT VTI:          0.202 m LVOT/AV VTI ratio: 0.63 AI PHT:            285 msec  AORTA Ao Root diam: 3.00 cm MITRAL VALVE                  TRICUSPID VALVE MV Area (PHT): 3.46 cm       TR Peak grad:   26.6 mmHg MV Decel Time: 219 msec       TR Vmax:        258.00 cm/s MR Peak grad:    79.6 mmHg MR Mean grad:    69.0 mmHg    SHUNTS MR Vmax:         446.00 cm/s  Systemic VTI:  0.20 m MR Vmean:        389.0 cm/s   Systemic Diam: 1.80 cm MR PISA:         6.28 cm MR PISA Eff ROA: 33 mm MR PISA Radius:  1.00 cm MV E velocity: 111.20 cm/s Chilton Siiffany [place] MD Electronically signed by Chilton Siiffany [place] MD Signature Date/Time: 10/14/2022/6:11:19 PM    Final      LOS: 2 days   Signature  -    Susa RaringPrashant Genavive Kubicki M.D on 10/16/2022 at 9:26 AM   -  To page go to [email or web address]

## 2022-10-16 NOTE — Plan of Care (Signed)
  Problem: Education: Goal: Knowledge of General Education information will improve Description: Including pain rating scale, medication(s)/side effects and non-pharmacologic comfort measures Outcome: Progressing   Problem: Health Behavior/Discharge Planning: Goal: Ability to manage health-related needs will improve Outcome: Progressing   Problem: Clinical Measurements: Goal: Ability to maintain clinical measurements within normal limits will improve Outcome: Progressing   Problem: Activity: Goal: Risk for activity intolerance will decrease Outcome: Progressing   Problem: Coping: Goal: Level of anxiety will decrease Outcome: Progressing   Problem: Metabolic: Goal: Ability to maintain appropriate glucose levels will improve Outcome: Progressing   Problem: Tissue Perfusion: Goal: Adequacy of tissue perfusion will improve Outcome: Progressing

## 2022-10-17 DIAGNOSIS — J205 Acute bronchitis due to respiratory syncytial virus: Secondary | ICD-10-CM | POA: Diagnosis not present

## 2022-10-17 LAB — BRAIN NATRIURETIC PEPTIDE: B Natriuretic Peptide: 179.6 pg/mL — ABNORMAL HIGH (ref 0.0–100.0)

## 2022-10-17 LAB — CBC WITH DIFFERENTIAL/PLATELET
Abs Immature Granulocytes: 0.43 10*3/uL — ABNORMAL HIGH (ref 0.00–0.07)
Basophils Absolute: 0 10*3/uL (ref 0.0–0.1)
Basophils Relative: 0 %
Eosinophils Absolute: 0 10*3/uL (ref 0.0–0.5)
Eosinophils Relative: 0 %
HCT: 32.7 % — ABNORMAL LOW (ref 36.0–46.0)
Hemoglobin: 10.8 g/dL — ABNORMAL LOW (ref 12.0–15.0)
Immature Granulocytes: 5 %
Lymphocytes Relative: 16 %
Lymphs Abs: 1.4 10*3/uL (ref 0.7–4.0)
MCH: 34.7 pg — ABNORMAL HIGH (ref 26.0–34.0)
MCHC: 33 g/dL (ref 30.0–36.0)
MCV: 105.1 fL — ABNORMAL HIGH (ref 80.0–100.0)
Monocytes Absolute: 1.3 10*3/uL — ABNORMAL HIGH (ref 0.1–1.0)
Monocytes Relative: 14 %
Neutro Abs: 5.8 10*3/uL (ref 1.7–7.7)
Neutrophils Relative %: 65 %
Platelets: 362 10*3/uL (ref 150–400)
RBC: 3.11 MIL/uL — ABNORMAL LOW (ref 3.87–5.11)
RDW: 12.7 % (ref 11.5–15.5)
WBC: 9 10*3/uL (ref 4.0–10.5)
nRBC: 0.2 % (ref 0.0–0.2)

## 2022-10-17 LAB — BASIC METABOLIC PANEL
Anion gap: 12 (ref 5–15)
BUN: 35 mg/dL — ABNORMAL HIGH (ref 8–23)
CO2: 32 mmol/L (ref 22–32)
Calcium: 9 mg/dL (ref 8.9–10.3)
Chloride: 95 mmol/L — ABNORMAL LOW (ref 98–111)
Creatinine, Ser: 0.8 mg/dL (ref 0.44–1.00)
GFR, Estimated: >60 mL/min (ref >60)
Glucose, Bld: 140 mg/dL — ABNORMAL HIGH (ref 70–99)
Potassium: 3.8 mmol/L (ref 3.5–5.1)
Sodium: 139 mmol/L (ref 135–145)

## 2022-10-17 LAB — GLUCOSE, CAPILLARY
Glucose-Capillary: 148 mg/dL — ABNORMAL HIGH (ref 70–99)
Glucose-Capillary: 158 mg/dL — ABNORMAL HIGH (ref 70–99)
Glucose-Capillary: 240 mg/dL — ABNORMAL HIGH (ref 70–99)
Glucose-Capillary: 96 mg/dL (ref 70–99)

## 2022-10-17 LAB — C-REACTIVE PROTEIN: CRP: 1.1 mg/dL — ABNORMAL HIGH (ref <1.0)

## 2022-10-17 LAB — MAGNESIUM: Magnesium: 2.2 mg/dL (ref 1.7–2.4)

## 2022-10-17 MED ORDER — METHYLPREDNISOLONE 4 MG PO TBPK: ORAL_TABLET | ORAL | 0 refills | Status: DC

## 2022-10-17 MED ORDER — MOMETASONE FUROATE 50 MCG/ACT NA SUSP: 2.0000 | Freq: Every day | NASAL | 0 refills | Status: DC

## 2022-10-17 MED ORDER — IPRATROPIUM BROMIDE 0.02 % IN SOLN: 0.5000 mg | Freq: Four times a day (QID) | RESPIRATORY_TRACT | 12 refills | Status: DC

## 2022-10-17 MED ORDER — PREDNISONE 20 MG PO TABS
20.0000 mg | ORAL_TABLET | Freq: Every day | ORAL | Status: DC
  Administered 2022-10-17: 20 mg via ORAL
  Filled 2022-10-17: qty 1

## 2022-10-17 MED ORDER — GUAIFENESIN ER 600 MG PO TB12: 600.0000 mg | ORAL_TABLET | Freq: Two times a day (BID) | ORAL | 0 refills | Status: DC | PRN

## 2022-10-17 MED ORDER — PANTOPRAZOLE SODIUM 40 MG PO TBEC: 40.0000 mg | DELAYED_RELEASE_TABLET | Freq: Every day | ORAL | 0 refills | Status: DC

## 2022-10-17 MED ORDER — FUROSEMIDE 10 MG/ML IJ SOLN: 40.0000 mg | Freq: Once | INTRAMUSCULAR | Status: DC

## 2022-10-17 MED ORDER — METOPROLOL TARTRATE 50 MG PO TABS: 50.0000 mg | ORAL_TABLET | Freq: Two times a day (BID) | ORAL | 0 refills | Status: DC

## 2022-10-17 MED ORDER — LEVALBUTEROL HCL 0.63 MG/3ML IN NEBU: 0.6300 mg | INHALATION_SOLUTION | Freq: Three times a day (TID) | RESPIRATORY_TRACT | 0 refills | Status: DC | PRN

## 2022-10-17 MED ORDER — HYDROCOD POLI-CHLORPHE POLI ER 10-8 MG/5ML PO SUER: 5.0000 mL | Freq: Two times a day (BID) | ORAL | 0 refills | Status: DC | PRN

## 2022-10-17 NOTE — TOC Transition Note (Signed)
Transition of Care [facility]) - CM/SW Discharge Note   Patient Details  Name: [name] MRN: [id number] Date of Birth: 1940/12/08  Transition [facility]) CM/SW Contact:  Cyndi Bender, RN Phone Number: 10/17/2022, 1:38 PM   Clinical Narrative:     Patient is stable for discharge. Spoke to daughter, Olla, regarding transition needs.  Patient lives with daughter and has used [facility] in the past and would like to use them again. Tommi Rumps with [facility] accepted referral. Patient has all needed DME. Orders for  home 02. Patient doesn't qualify for home 02.  Family can transport home.    Final next level of care: Home w Home Health Services Barriers to Discharge: Barriers Resolved   Patient Goals and CMS Choice CMS Medicare.gov Compare Post Acute Care list provided to:: Patient Represenative (must comment) Choice offered to / list presented to : Adult Children  Discharge Placement               home          Discharge Plan and Services Additional resources added to the After Visit Summary for                            [facility] Arranged: PT, OT HH Agency: [place] Date [facility] Agency Contacted: 10/17/22 Time [place]: 1059 Representative spoke with at [place]: [place] Determinants of Health ([place]) Interventions SDOH Screenings   Tobacco Use: Medium Risk (10/14/2022)     Readmission Risk Interventions    10/17/2022    1:36 PM  Readmission Risk Prevention Plan  Transportation Screening Complete  PCP or Specialist Appt within 5-7 Days Complete  Home Care Screening Complete  Medication Review (RN CM) Complete

## 2022-10-17 NOTE — Progress Notes (Signed)
  Transition of Care [facility]) Screening Note   Patient Details  Name: [name] Date of Birth: [birth year]   Transition of Care [facility]) CM/SW Contact:    Cyndi Bender, RN Phone Number: 10/17/2022, 9:24 AM    Transition of Care Department [facility]) has reviewed patient and no TOC needs have been identified at this time. We will continue to monitor patient advancement through interdisciplinary progression rounds. If new patient transition needs arise, please place a TOC consult.

## 2022-10-17 NOTE — Discharge Summary (Addendum)
[place] IWO:[id number] DOB: [birth year] DOA: 10/13/2022  PCP: Lauren Salter, MD  Admit date: 10/13/2022  Discharge date: 10/17/2022  Admitted From: Home   Disposition:  Home   Recommendations for Outpatient Follow-up:   Follow up with PCP in 1-2 weeks  PCP Please obtain BMP/CBC, 2 view CXR in 1week,  (see Discharge instructions)   PCP Please follow up on the following pending results:    Home Health: PT, OT iof qualifies Equipment/Devices: Walker, 02 if qualifies  Consultations: None  Discharge Condition: Stable    CODE STATUS: Full    Diet Recommendation: Heart Healthy     Chief Complaint  Patient presents with   Chest Pain   Shortness of Breath     Brief history of present illness from the day of admission and additional interim summary    82 y.o.  female with history of prior CVA with mild left-sided hemiparesis, DM-2, atrial fibrillation who presented with 2-3 history of cough/wheezing/shortness of breath-she was found to have acute hypoxic respiratory failure due to RSV bronchitis and A-fib with RVR.   Significant events: 12/28>> admit to [facility]-severe hypoxic respiratory failure requiring BiPAP-RSV bronchitis.   Significant studies: 12/28>> CXR: No obvious pneumonia   Significant microbiology data: 12/28>> RSV PCR: Positive 12/28>> influenza/COVID PCR: Negative                                                                 Hospital Course   Acute hypoxic respiratory failure due to RSV bronchitis - she was placed on short course of steroids which she has completed today, down to 1 L nasal cannula oxygen at rest and completely symptom-free, initially required BiPAP for a few hours along with 4 to 6 L of oxygen, currently on 1 L nasal cannula oxygen at rest upon ambulation on room air she does drop  to 87% pulse ox which improves to 50% on application of 1 L nasal cannula oxygen.  She is completely symptom-free.  Will be discharged home with nebulizer treatments, home oxygen if she qualifies along with supportive care for cough with close outpatient PCP follow-up.   Chronic atrial fibrillation with RVR [place] vas 2 score of greater than 4. Continue Eliquis for anticoagulation Lopressor dose adjusted on 10/16/2022 for better rate control, pressure and rate much better on present dose Lopressor which she is being discharged.  PCP to monitor.   Developed mild acute on chronic diastolic CHF due to the stress of RSV bronchitis.  Resolved after few doses of Lasix.  Euvolemic now.  PCP to monitor.  EF preserved at 60%.    History of CVA with mild left-sided hemiparesis Continue Eliquis PT/OT at home upon discharge.  No acute issues.   Chronic back pain Continue Cymbalta Continue as needed Lidoderm patch/Tylenol Mobilize with PT/OT, will get  home PT OT.   Epigastric pain morning of 10/16/2022.  Relieved with Maalox.  EKG stable.  PPI added, solved now will be discharged on PPI.   DM-2.  Diet controlled.   Lab Results  Component Value Date   HGBA1C 6.9 (H) 10/14/2022    Discharge diagnosis     Principal Problem:   RSV bronchitis Active Problems:   Acute hypoxic respiratory failure (HCC)   Chronic atrial fibrillation (HCC)   Type 2 diabetes mellitus with peripheral neuropathy (HCC)   Essential hypertension   Left hemiparesis (HCC)    Discharge instructions    Discharge Instructions     Diet - low sodium heart healthy   Complete by: As directed    Discharge instructions   Complete by: As directed    Follow with Primary MD Lauren Memos, MD in 7 days   Get CBC, CMP, 2 view Chest X ray -  checked next visit with your primary MD    Activity: As tolerated with Full fall precautions use walker/cane & assistance as needed  Disposition Home    Diet: Heart Healthy   Special  Instructions: If you have smoked or chewed Tobacco  in the last 2 yrs please stop smoking, stop any regular Alcohol  and or any Recreational drug use.  On your next visit with your primary care physician please Get Medicines reviewed and adjusted.  Please request your Prim.MD to go over all Hospital Tests and Procedure/Radiological results at the follow up, please get all Hospital records sent to your Prim MD by signing hospital release before you go home.  If you experience worsening of your admission symptoms, develop shortness of breath, life threatening emergency, suicidal or homicidal thoughts you must seek medical attention immediately by calling 911 or calling your MD immediately  if symptoms less severe.  You Must read complete instructions/literature along with all the possible adverse reactions/side effects for all the Medicines you take and that have been prescribed to you. Take any new Medicines after you have completely understood and accpet all the possible adverse reactions/side effects.   Increase activity slowly   Complete by: As directed        Discharge Medications   Allergies as of 10/17/2022       Reactions   Bactrim [sulfamethoxazole-trimethoprim] Cough   And flushing of face and conjunctivitis.         Medication List     STOP taking these medications    HYDROcodone-acetaminophen 5-325 MG tablet Commonly known as: NORCO/VICODIN   polyethylene glycol 17 g packet Commonly known as: MIRALAX / GLYCOLAX       TAKE these medications    acetaminophen 325 MG tablet Commonly known as: TYLENOL Take 2 tablets (650 mg total) by mouth every 6 (six) hours as needed for mild pain, headache or fever.   apixaban 5 MG Tabs tablet Commonly known as: ELIQUIS Take 1 tablet (5 mg total) by mouth 2 (two) times daily.   augmented betamethasone dipropionate 0.05 % ointment Commonly known as: DIPROLENE-AF Apply 1 Application topically 2 (two) times daily as needed (rash  on legs).   CALCIUM-CARB 600 PO Take 1 tablet by mouth daily. Gummy   cetirizine 10 MG tablet Commonly known as: ZYRTEC Take 10 mg daily as needed by mouth (seasonal allergies).   chlorpheniramine-HYDROcodone 10-8 MG/5ML Commonly known as: TUSSIONEX Take 5 mLs by mouth every 12 (twelve) hours as needed for cough.   DULoxetine 30 MG capsule Commonly known as: CYMBALTA Take 30  mg by mouth at bedtime.   fluticasone 50 MCG/ACT nasal spray Commonly known as: FLONASE Place 1 spray daily as needed into both nostrils (seasonal allergies).   Forteo 600 MCG/2.4ML Sopn Generic drug: Teriparatide (Recombinant) Inject 20 mcg into the skin at bedtime.   guaiFENesin 600 MG 12 hr tablet Commonly known as: MUCINEX Take 1 tablet (600 mg total) by mouth 2 (two) times daily as needed.   hydrocortisone 2.5 % cream Apply 1 application topically daily as needed (itching).   ipratropium 0.02 % nebulizer solution Commonly known as: ATROVENT Take 2.5 mLs (0.5 mg total) by nebulization every 6 (six) hours.   levalbuterol 0.63 MG/3ML nebulizer solution Commonly known as: XOPENEX Take 3 mLs (0.63 mg total) by nebulization every 8 (eight) hours as needed for wheezing or shortness of breath.   lidocaine 5 % Commonly known as: LIDODERM Place 2 patches onto the skin daily. Remove & Discard patch within 12 hours or as directed by MD What changed:  when to take this reasons to take this   metFORMIN 500 MG tablet Commonly known as: GLUCOPHAGE Take 0.5 tablets (250 mg total) by mouth 2 (two) times daily with a meal. What changed: how much to take   methylPREDNISolone 4 MG Tbpk tablet Commonly known as: MEDROL DOSEPAK follow package directions   metoprolol tartrate 50 MG tablet Commonly known as: LOPRESSOR Take 1 tablet (50 mg total) by mouth 2 (two) times daily. What changed:  medication strength how much to take   mometasone 50 MCG/ACT nasal spray Commonly known as: Nasonex Place 2  sprays into the nose daily.   nitroGLYCERIN 0.4 MG SL tablet Commonly known as: NITROSTAT Place 1 tablet (0.4 mg total) under the tongue every 5 (five) minutes as needed for chest pain.   pantoprazole 40 MG tablet Commonly known as: PROTONIX Take 1 tablet (40 mg total) by mouth daily. Start taking on: October 18, 2022   pravastatin 20 MG tablet Commonly known as: PRAVACHOL Take 1 tablet (20 mg total) by mouth at bedtime.   senna-docusate 8.6-50 MG tablet Commonly known as: Senokot-S Take 1 tablet by mouth at bedtime as needed for mild constipation.   tiZANidine 4 MG tablet Commonly known as: ZANAFLEX Take 2-4 mg by mouth at bedtime as needed for muscle spasms.   traMADol 50 MG tablet Commonly known as: ULTRAM Take 1 tablet (50 mg total) by mouth every 6 (six) hours as needed for moderate pain. What changed: when to take this   Vitamin D 50 MCG (2000 UT) tablet Take 1 tablet (2,000 Units total) by mouth daily.   vitamin k 100 MCG tablet Take 100 mcg by mouth daily.               Durable Medical Equipment  (From admission, onward)           Start     Ordered   10/17/22 0933  For home use only DME Walker rolling  Once       Comments: 5 wheel  Question Answer Comment  Walker: With 5 Inch Wheels   Patient needs a walker to treat with the following condition Weakness      10/17/22 0932   10/17/22 0933  For home use only DME oxygen  Once       Question Answer Comment  Length of Need 6 Months   Mode or (Route) Nasal cannula   Liters per Minute 1   Frequency Continuous (stationary and portable oxygen unit needed)  Oxygen delivery system Gas      10/17/22 0932             Follow-up Information     Lauren Memos, MD. Schedule an appointment as soon as possible for a visit in 1 week(s).   Specialty: Internal Medicine Contact information: [address] [place] [address] [phone]         Care, [facility] Follow up.    Specialty: Home Health Services Why: Home health has been arranged. They will contact you within 48hrs post discharge to schedule a visit. Contact information: [address] [place] [address] [phone]                 Major procedures and Radiology Reports - PLEASE review detailed and final reports thoroughly  -      DG Chest Port 1 View  Result Date: 10/15/2022 CLINICAL DATA:  Shortness of breath and chest pain EXAM: PORTABLE CHEST 1 VIEW COMPARISON:  10/13/2022 and prior studies FINDINGS: This is a low volume study. Cardiomediastinal silhouette is unchanged. Mild increased opacity in the perihilar regions noted which may be related to atelectasis or mild central edema. LEFT basilar atelectasis again noted. There is no evidence of pneumothorax or large pleural effusion. IMPRESSION: Low volume study with mild increased perihilar opacity which may be related to atelectasis or mild central edema. Electronically Signed   By: Harmon Pier M.D.   On: 10/15/2022 11:58   ECHOCARDIOGRAM COMPLETE  Result Date: 10/14/2022    ECHOCARDIOGRAM REPORT   Patient Name:   [name]   Date of Exam: 10/14/2022 Medical Rec #:  [id number]  Height:       56.0 in Accession #:    [id number] Weight:       135.0 lb Date of Birth:  [birth year]  BSA:          1.502 m Patient Age:    81 years   BP:           106/64 mmHg Patient Gender: F          HR:           88 bpm. Exam Location:  Inpatient Procedure: 2D Echo Indications:    dyspnea  History:        Patient has prior history of Echocardiogram examinations, most                 recent 10/17/2016. Arrythmias:Atrial Fibrillation; Risk                 Factors:Dyslipidemia, Diabetes and Hypertension.  Sonographer:    Cathie Hoops Referring Phys: [phone] [facility] M [facility]  Sonographer Comments: Technically difficult study due to poor echo windows, suboptimal parasternal window, suboptimal apical window and suboptimal subcostal window. Image acquisition  challenging due to patient body habitus, Image acquisition challenging due to respiratory motion and supine. IMPRESSIONS  1. Left ventricular ejection fraction, by estimation, is 65 to 70%. The left ventricle has normal function. The left ventricle has no regional wall motion abnormalities. Left ventricular diastolic parameters are indeterminate. Elevated left ventricular end-diastolic pressure.  2. Right ventricular systolic function is normal. The right ventricular size is normal. There is normal pulmonary artery systolic pressure.  3. Left atrial size was severely dilated.  4. Right atrial size was moderately dilated.  5. The mitral valve is normal in structure. Mild mitral valve regurgitation. No evidence of mitral stenosis.  6. The aortic valve is tricuspid. There is  mild calcification of the aortic valve. There is mild thickening of the aortic valve. Aortic valve regurgitation is trivial. Aortic valve sclerosis/calcification is present, without any evidence of aortic stenosis. Aortic regurgitation PHT measures 285 msec. Aortic valve area, by VTI measures 1.59 cm. Aortic valve mean gradient measures 7.0 mmHg. Aortic valve Vmax measures 1.74 m/s.  7. The inferior vena cava is normal in size with greater than 50% respiratory variability, suggesting right atrial pressure of 3 mmHg. FINDINGS  Left Ventricle: Left ventricular ejection fraction, by estimation, is 65 to 70%. The left ventricle has normal function. The left ventricle has no regional wall motion abnormalities. The left ventricular internal cavity size was normal in size. There is  no left ventricular hypertrophy. Left ventricular diastolic parameters are indeterminate. Elevated left ventricular end-diastolic pressure. Right Ventricle: The right ventricular size is normal. No increase in right ventricular wall thickness. Right ventricular systolic function is normal. There is normal pulmonary artery systolic pressure. The tricuspid regurgitant velocity  is 2.58 m/s, and  with an assumed right atrial pressure of 3 mmHg, the estimated right ventricular systolic pressure is 95.6 mmHg. Left Atrium: Left atrial size was severely dilated. Right Atrium: Right atrial size was moderately dilated. Pericardium: There is no evidence of pericardial effusion. Mitral Valve: The mitral valve is normal in structure. Mild to moderate mitral annular calcification. Mild mitral valve regurgitation. No evidence of mitral valve stenosis. Tricuspid Valve: The tricuspid valve is normal in structure. Tricuspid valve regurgitation is trivial. No evidence of tricuspid stenosis. Aortic Valve: The aortic valve is tricuspid. There is mild calcification of the aortic valve. There is mild thickening of the aortic valve. Aortic valve regurgitation is trivial. Aortic regurgitation PHT measures 285 msec. Aortic valve sclerosis/calcification is present, without any evidence of aortic stenosis. Aortic valve mean gradient measures 7.0 mmHg. Aortic valve peak gradient measures 12.2 mmHg. Aortic valve area, by VTI measures 1.59 cm. Pulmonic Valve: The pulmonic valve was normal in structure. Pulmonic valve regurgitation is mild. No evidence of pulmonic stenosis. Aorta: The aortic root is normal in size and structure. Venous: The inferior vena cava is normal in size with greater than 50% respiratory variability, suggesting right atrial pressure of 3 mmHg. IAS/Shunts: No atrial level shunt detected by color flow Doppler.  LEFT VENTRICLE PLAX 2D LVIDd:         3.15 cm     Diastology LVIDs:         2.35 cm     LV e' medial:    5.87 cm/s LV PW:         1.00 cm     LV E/e' medial:  18.9 LV IVS:        0.85 cm     LV e' lateral:   9.14 cm/s LVOT diam:     1.80 cm     LV E/e' lateral: 12.2 LV SV:         52 LV SV Index:   34 LVOT Area:     2.54 cm  LV Volumes (MOD) LV vol d, MOD A2C: 71.6 ml LV vol d, MOD A4C: 40.3 ml LV vol s, MOD A2C: 31.5 ml LV vol s, MOD A4C: 12.1 ml LV SV MOD A2C:     40.1 ml LV SV MOD A4C:      40.3 ml LV SV MOD BP:      34.5 ml RIGHT VENTRICLE RV Basal diam:  4.50 cm RV S prime:     12.85 cm/s TAPSE (M-mode):  1.3 cm LEFT ATRIUM             Index        RIGHT ATRIUM           Index LA diam:        3.40 cm 2.26 cm/m   RA Area:     19.80 cm LA Vol (A2C):   71.1 ml 47.33 ml/m  RA Volume:   53.40 ml  35.55 ml/m LA Vol (A4C):   64.6 ml 43.01 ml/m LA Biplane Vol: 72.2 ml 48.07 ml/m  AORTIC VALVE                     PULMONIC VALVE AV Area (Vmax):    1.64 cm      PV Vmax:          1.21 m/s AV Area (Vmean):   1.60 cm      PV Peak grad:     5.9 mmHg AV Area (VTI):     1.59 cm      PR End Diast Vel: 12.25 msec AV Vmax:           174.40 cm/s AV Vmean:          124.800 cm/s AV VTI:            0.323 m AV Peak Grad:      12.2 mmHg AV Mean Grad:      7.0 mmHg LVOT Vmax:         112.20 cm/s LVOT Vmean:        78.660 cm/s LVOT VTI:          0.202 m LVOT/AV VTI ratio: 0.63 AI PHT:            285 msec  AORTA Ao Root diam: 3.00 cm MITRAL VALVE                  TRICUSPID VALVE MV Area (PHT): 3.46 cm       TR Peak grad:   26.6 mmHg MV Decel Time: 219 msec       TR Vmax:        258.00 cm/s MR Peak grad:    79.6 mmHg MR Mean grad:    69.0 mmHg    SHUNTS MR Vmax:         446.00 cm/s  Systemic VTI:  0.20 m MR Vmean:        389.0 cm/s   Systemic Diam: 1.80 cm MR PISA:         6.28 cm MR PISA Eff ROA: 33 mm MR PISA Radius:  1.00 cm MV E velocity: 111.20 cm/s Chilton Si MD Electronically signed by Chilton Si MD Signature Date/Time: 10/14/2022/6:11:19 PM    Final    DG Chest Port 1 View  Result Date: 10/13/2022 CLINICAL DATA:  Dyspnea EXAM: PORTABLE CHEST 1 VIEW COMPARISON:  12/09/2021 FINDINGS: Lung volumes are small. Stable retrocardiac opacity likely reflecting parenchymal scarring in this region. No pneumothorax or pleural effusion. Cardiac size is within normal limits when accounting for poor pulmonary insufflation, stable since prior examination. Pulmonary vascularity is normal. No acute bone  abnormality. IMPRESSION: 1. Pulmonary hypoinflation. Electronically Signed   By: Helyn Numbers M.D.   On: 10/13/2022 20:12    Micro Results    Recent Results (from the past 240 hour(s))  Blood culture (routine x 2)     Status: None (Preliminary result)   Collection Time: 10/13/22  8:16 PM   Specimen:  BLOOD  Result Value Ref Range Status   Specimen Description   Final    BLOOD BLOOD LEFT ARM Performed at [facility], [address], [place], [place] [address]    Special Requests   Final    BOTTLES DRAWN AEROBIC AND ANAEROBIC Blood Culture adequate volume Performed at [facility], [address] [place], [place] [address]    Culture   Final    NO GROWTH 3 DAYS Performed at [facility], [address], [place], [place] [address]    Report Status PENDING  Incomplete  Blood culture (routine x 2)     Status: None (Preliminary result)   Collection Time: 10/13/22  8:41 PM   Specimen: BLOOD  Result Value Ref Range Status   Specimen Description   Final    BLOOD BLOOD RIGHT HAND AEROBIC BOTTLE ONLY Performed at [facility], [address] [place], [place] [address]    Special Requests   Final    BOTTLES DRAWN AEROBIC ONLY Blood Culture adequate volume Performed at [facility], [address] [place], [place] [address]    Culture   Final    NO GROWTH 3 DAYS Performed at [facility], [address], [place], [place] [address]    Report Status PENDING  Incomplete  Resp panel by RT-PCR (RSV, Flu A&B, Covid) Anterior Nasal Swab     Status: Abnormal   Collection Time: 10/13/22 10:03 PM   Specimen: Anterior Nasal Swab  Result Value Ref Range Status   SARS Coronavirus 2 by RT PCR NEGATIVE NEGATIVE Final    Comment: (NOTE) SARS-CoV-2 target nucleic acids are NOT DETECTED.  The SARS-CoV-2 RNA is generally detectable in upper respiratory specimens during the acute phase of infection. The  lowest concentration of SARS-CoV-2 viral copies this assay can detect is 138 copies/mL. A negative result does not preclude SARS-Cov-2 infection and should not be used as the sole basis for treatment or other patient management decisions. A negative result may occur with  improper specimen collection/handling, submission of specimen other than nasopharyngeal swab, presence of viral mutation(s) within the areas targeted by this assay, and inadequate number of viral copies(<138 copies/mL). A negative result must be combined with clinical observations, patient history, and epidemiological information. The expected result is Negative.  Fact Sheet for Patients:  BloggerCourse.com  Fact Sheet for Healthcare Providers:  SeriousBroker.it  This test is no t yet approved or cleared by the [place] FDA and  has been authorized for detection and/or diagnosis of SARS-CoV-2 by FDA under an Emergency Use Authorization (EUA). This EUA will remain  in effect (meaning this test can be used) for the duration of the COVID-19 declaration under Section 564(b)(1) of the Act, 21 U.S.C.section 360bbb-3(b)(1), unless the authorization is terminated  or revoked sooner.       Influenza A by PCR NEGATIVE NEGATIVE Final   Influenza B by PCR NEGATIVE NEGATIVE Final    Comment: (NOTE) The Xpert Xpress SARS-CoV-2/FLU/RSV plus assay is intended as an aid in the diagnosis of influenza from Nasopharyngeal swab specimens and should not be used as a sole basis for treatment. Nasal washings and aspirates are unacceptable for Xpert Xpress SARS-CoV-2/FLU/RSV testing.  Fact Sheet for Patients: BloggerCourse.com  Fact Sheet for Healthcare Providers: SeriousBroker.it  This test is not yet approved or cleared by the [place] FDA and has been authorized for detection and/or diagnosis of SARS-CoV-2 by FDA under  an Emergency Use Authorization (EUA). This  EUA will remain in effect (meaning this test can be used) for the duration of the COVID-19 declaration under Section 564(b)(1) of the Act, 21 U.S.C. section 360bbb-3(b)(1), unless the authorization is terminated or revoked.     Resp Syncytial Virus by PCR POSITIVE (A) NEGATIVE Final    Comment: (NOTE) Fact Sheet for Patients: BloggerCourse.com  Fact Sheet for Healthcare Providers: SeriousBroker.it  This test is not yet approved or cleared by the [place] FDA and has been authorized for detection and/or diagnosis of SARS-CoV-2 by FDA under an Emergency Use Authorization (EUA). This EUA will remain in effect (meaning this test can be used) for the duration of the COVID-19 declaration under Section 564(b)(1) of the Act, 21 U.S.C. section 360bbb-3(b)(1), unless the authorization is terminated or revoked.  Performed at [facility], [address] [place], [place] [address]     Today   Subjective    [facility] today has no headache,no chest abdominal pain,no new weakness tingling or numbness, feels much better wants to go home today.    Objective   Blood pressure 128/70, pulse (!) 104, temperature 98.2 F (36.8 C), temperature source Oral, resp. rate (!) 22, height 4\' 8"  (1.422 m), weight 60 kg, SpO2 100 %.   Intake/Output Summary (Last 24 hours) at 10/17/2022 1757 Last data filed at 10/16/2022 1920 Gross per 24 hour  Intake --  Output 800 ml  Net -800 ml    Exam  Awake Alert, No new F.N deficits,    [place].AT,PERRAL Supple Neck,   Symmetrical Chest wall movement, Good air movement bilaterally, CTAB RRR,No Gallops,   +ve B.Sounds, Abd Soft, Non tender,  No Cyanosis, Clubbing or edema    Data Review   Recent Labs  Lab 10/13/22 1931 10/14/22 0534 10/15/22 0226 10/16/22 0255 10/17/22 0331  WBC 10.9* 10.6* 9.0 12.2* 9.0  HGB 11.6* 10.0* 9.8* 10.6*  10.8*  HCT 36.4 31.9* 31.2* 32.7* 32.7*  PLT 339 305 322 345 362  MCV 105.5* 107.8* 107.6* 106.5* 105.1*  MCH 33.6 33.8 33.8 34.5* 34.7*  MCHC 31.9 31.3 31.4 32.4 33.0  RDW 13.2 13.0 13.1 12.9 12.7  LYMPHSABS  --   --   --  1.0 1.4  MONOABS  --   --   --  1.0 1.3*  EOSABS  --   --   --  0.0 0.0  BASOSABS  --   --   --  0.1 0.0    Recent Labs  Lab 10/13/22 1931 10/14/22 0534 10/15/22 0226 10/16/22 0255 10/17/22 0331  NA 135 140 139 139 139  K 4.0 3.9 4.6 4.7 3.8  CL 98 103 106 101 95*  CO2 25 23 27 30  32  ANIONGAP 12 14 6 8 12   GLUCOSE 261* 139* 178* 122* 140*  BUN 17 11 22  34* 35*  CREATININE 0.76 0.63 0.72 0.90 0.80  CRP  --  7.2*  --  1.8* 1.1*  PROCALCITON  --  0.11  --   --   --   HGBA1C  --  6.9*  --   --   --   BNP 273.3*  --  395.0* 288.3* 179.6*  MG  --   --   --  2.2 2.2  CALCIUM 9.5 8.7* 8.9 9.1 9.0     Total Time in preparing paper work, data evaluation and todays exam - 35 minutes  Signature  -    12/16/22 M.D on 10/17/2022 at 5:57 PM   -  To  page go to [email or web address]

## 2022-10-17 NOTE — Progress Notes (Signed)
SATURATION QUALIFICATIONS: (This note is used to comply with regulatory documentation for home oxygen)  Patient Saturations on Room Air at Rest = 99%  Patient Saturations on Room Air while Ambulating = 92%   

## 2022-10-17 NOTE — Discharge Instructions (Signed)
Follow with Primary MD Haydee Salter, MD in 7 days   Get CBC, CMP, 2 view Chest X ray -  checked next visit with your primary MD    Activity: As tolerated with Full fall precautions use walker/cane & assistance as needed  Disposition Home    Diet: Heart Healthy   Special Instructions: If you have smoked or chewed Tobacco  in the last 2 yrs please stop smoking, stop any regular Alcohol  and or any Recreational drug use.  On your next visit with your primary care physician please Get Medicines reviewed and adjusted.  Please request your Prim.MD to go over all Hospital Tests and Procedure/Radiological results at the follow up, please get all Hospital records sent to your Prim MD by signing hospital release before you go home.  If you experience worsening of your admission symptoms, develop shortness of breath, life threatening emergency, suicidal or homicidal thoughts you must seek medical attention immediately by calling 911 or calling your MD immediately  if symptoms less severe.  You Must read complete instructions/literature along with all the possible adverse reactions/side effects for all the Medicines you take and that have been prescribed to you. Take any new Medicines after you have completely understood and accpet all the possible adverse reactions/side effects.

## 2022-10-19 LAB — CULTURE, BLOOD (ROUTINE X 2)
Culture: NO GROWTH
Culture: NO GROWTH
Special Requests: ADEQUATE
Special Requests: ADEQUATE

## 2022-11-29 ENCOUNTER — Telehealth (HOSPITAL_COMMUNITY): Payer: Self-pay

## 2022-11-29 NOTE — Telephone Encounter (Signed)
Attempted to contact patient via interpreter services to schedule OP Modified Barium Swallow - left voicemail.

## 2022-12-04 ENCOUNTER — Emergency Department (HOSPITAL_BASED_OUTPATIENT_CLINIC_OR_DEPARTMENT_OTHER): Payer: Medicare Other

## 2022-12-04 ENCOUNTER — Other Ambulatory Visit: Payer: Self-pay

## 2022-12-04 ENCOUNTER — Emergency Department (HOSPITAL_BASED_OUTPATIENT_CLINIC_OR_DEPARTMENT_OTHER)
Admission: EM | Admit: 2022-12-04 | Discharge: 2022-12-04 | Disposition: A | Discharge status: Home / Self Care | Payer: Medicare Other | Attending: Emergency Medicine | Admitting: Emergency Medicine

## 2022-12-04 ENCOUNTER — Encounter (HOSPITAL_BASED_OUTPATIENT_CLINIC_OR_DEPARTMENT_OTHER): Payer: Self-pay | Admitting: Emergency Medicine

## 2022-12-04 DIAGNOSIS — M25531 Pain in right wrist: Secondary | ICD-10-CM | POA: Insufficient documentation

## 2022-12-04 DIAGNOSIS — S0083XA Contusion of other part of head, initial encounter: Secondary | ICD-10-CM | POA: Diagnosis not present

## 2022-12-04 DIAGNOSIS — W01198A Fall on same level from slipping, tripping and stumbling with subsequent striking against other object, initial encounter: Secondary | ICD-10-CM | POA: Diagnosis not present

## 2022-12-04 DIAGNOSIS — S0990XA Unspecified injury of head, initial encounter: Secondary | ICD-10-CM | POA: Diagnosis present

## 2022-12-04 DIAGNOSIS — R109 Unspecified abdominal pain: Secondary | ICD-10-CM | POA: Insufficient documentation

## 2022-12-04 DIAGNOSIS — M25561 Pain in right knee: Secondary | ICD-10-CM | POA: Diagnosis not present

## 2022-12-04 DIAGNOSIS — Z7901 Long term (current) use of anticoagulants: Secondary | ICD-10-CM | POA: Diagnosis not present

## 2022-12-04 DIAGNOSIS — E119 Type 2 diabetes mellitus without complications: Secondary | ICD-10-CM | POA: Insufficient documentation

## 2022-12-04 DIAGNOSIS — M25562 Pain in left knee: Secondary | ICD-10-CM | POA: Insufficient documentation

## 2022-12-04 DIAGNOSIS — Z7984 Long term (current) use of oral hypoglycemic drugs: Secondary | ICD-10-CM | POA: Insufficient documentation

## 2022-12-04 DIAGNOSIS — M542 Cervicalgia: Secondary | ICD-10-CM | POA: Insufficient documentation

## 2022-12-04 DIAGNOSIS — I4891 Unspecified atrial fibrillation: Secondary | ICD-10-CM | POA: Diagnosis not present

## 2022-12-04 DIAGNOSIS — S00511A Abrasion of lip, initial encounter: Secondary | ICD-10-CM | POA: Diagnosis not present

## 2022-12-04 DIAGNOSIS — W19XXXA Unspecified fall, initial encounter: Secondary | ICD-10-CM

## 2022-12-04 DIAGNOSIS — R0789 Other chest pain: Secondary | ICD-10-CM | POA: Insufficient documentation

## 2022-12-04 DIAGNOSIS — Z79899 Other long term (current) drug therapy: Secondary | ICD-10-CM | POA: Insufficient documentation

## 2022-12-04 LAB — COMPREHENSIVE METABOLIC PANEL
ALT: 8 U/L (ref 0–44)
AST: 13 U/L — ABNORMAL LOW (ref 15–41)
Albumin: 4.7 g/dL (ref 3.5–5.0)
Alkaline Phosphatase: 43 U/L (ref 38–126)
Anion gap: 11 (ref 5–15)
BUN: 16 mg/dL (ref 8–23)
CO2: 27 mmol/L (ref 22–32)
Calcium: 9.8 mg/dL (ref 8.9–10.3)
Chloride: 101 mmol/L (ref 98–111)
Creatinine, Ser: 0.59 mg/dL (ref 0.44–1.00)
GFR, Estimated: >60 mL/min (ref >60)
Glucose, Bld: 118 mg/dL — ABNORMAL HIGH (ref 70–99)
Potassium: 4.1 mmol/L (ref 3.5–5.1)
Sodium: 139 mmol/L (ref 135–145)
Total Bilirubin: 0.5 mg/dL (ref 0.3–1.2)
Total Protein: 7 g/dL (ref 6.5–8.1)

## 2022-12-04 LAB — CBC WITH DIFFERENTIAL/PLATELET
Abs Immature Granulocytes: 0.04 10*3/uL (ref 0.00–0.07)
Basophils Absolute: 0.1 10*3/uL (ref 0.0–0.1)
Basophils Relative: 1 %
Eosinophils Absolute: 0.1 10*3/uL (ref 0.0–0.5)
Eosinophils Relative: 1 %
HCT: 34.5 % — ABNORMAL LOW (ref 36.0–46.0)
Hemoglobin: 11.3 g/dL — ABNORMAL LOW (ref 12.0–15.0)
Immature Granulocytes: 0 %
Lymphocytes Relative: 27 %
Lymphs Abs: 2.5 10*3/uL (ref 0.7–4.0)
MCH: 34 pg (ref 26.0–34.0)
MCHC: 32.8 g/dL (ref 30.0–36.0)
MCV: 103.9 fL — ABNORMAL HIGH (ref 80.0–100.0)
Monocytes Absolute: 0.8 10*3/uL (ref 0.1–1.0)
Monocytes Relative: 8 %
Neutro Abs: 6.1 10*3/uL (ref 1.7–7.7)
Neutrophils Relative %: 63 %
Platelets: 306 10*3/uL (ref 150–400)
RBC: 3.32 MIL/uL — ABNORMAL LOW (ref 3.87–5.11)
RDW: 13.2 % (ref 11.5–15.5)
WBC: 9.6 10*3/uL (ref 4.0–10.5)
nRBC: 0 % (ref 0.0–0.2)

## 2022-12-04 MED ORDER — IOHEXOL 300 MG/ML  SOLN
100.0000 mL | Freq: Once | INTRAMUSCULAR | Status: AC | PRN
  Administered 2022-12-04: 100 mL via INTRAVENOUS

## 2022-12-04 MED ORDER — HYDROMORPHONE HCL 1 MG/ML IJ SOLN
0.5000 mg | Freq: Once | INTRAMUSCULAR | Status: AC
  Administered 2022-12-04: 0.5 mg via INTRAVENOUS
  Filled 2022-12-04: qty 1

## 2022-12-04 MED ORDER — OXYCODONE HCL 5 MG/5ML PO SOLN: 5.0000 mg | Freq: Four times a day (QID) | ORAL | 0 refills | Status: DC | PRN

## 2022-12-04 MED ORDER — OXYCODONE HCL 5 MG/5ML PO SOLN: 5.0000 mg | Freq: Four times a day (QID) | ORAL | 0 refills | Status: AC | PRN

## 2022-12-04 MED ORDER — FENTANYL CITRATE PF 50 MCG/ML IJ SOSY
50.0000 ug | PREFILLED_SYRINGE | Freq: Once | INTRAMUSCULAR | Status: AC
  Administered 2022-12-04: 50 ug via INTRAVENOUS
  Filled 2022-12-04: qty 1

## 2022-12-04 MED ORDER — OXYCODONE-ACETAMINOPHEN 5-325 MG PO TABS
1.0000 | ORAL_TABLET | Freq: Once | ORAL | Status: AC
  Administered 2022-12-04: 1 via ORAL
  Filled 2022-12-04: qty 1

## 2022-12-04 NOTE — Discharge Instructions (Addendum)
Note the workup today was overall reassuring.  No evidence of brain bleed or acute fracture.  There is some displacement of the T12 fracture as we discussed.  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  Will send in pain medication to take for breakthrough pain, may take Tylenol for baseline pain.  Hold Eliquis for tomorrow but resume on Tuesday.  I will also attach information for social work.  Please do not hesitate to return to emergency department for worrisome signs and symptoms we discussed become apparent.

## 2022-12-04 NOTE — ED Triage Notes (Signed)
Pt presents to ED Pov. Pt c/o mechanical fall this morning ~0800. Pt was not using walker at the time that she is supposed to use all the time. Pt taking eliquis

## 2022-12-04 NOTE — ED Notes (Signed)
Dermabond placed at bedside for EDPA

## 2022-12-04 NOTE — ED Provider Notes (Signed)
[place] Provider Note   CSN: [id number] Arrival date & time: 12/04/22  1251     History  Chief Complaint  Patient presents with   [facility] is a 82 y.o. female.   Fall   82 year old female presents emergency department after fall.  Fall unwitnessed.  Patient was found by grandson after grandson heard a loud bang from another room in the home.  Patient was found facedown on the floor without walker nearby.  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  Patient without loss of consciousness per daughter.  Patient currently complaining of right-sided facial pain/swelling, right lower lip pain, right wrist pain, chest pain, bilateral knee pain.  Patient at baseline cognition per daughter.  Patient reports compliance with at home Eliquis.  Denies nausea, vomiting, abdominal pain, dizziness/lightheadedness/chest pain/shortness of breath prior to fall.  Past medical history significant for atrial fibrillation on Eliquis, diabetes mellitus, subdural hematoma, CVA, hyperlipidemia, T12 compression fracture  Home Medications Prior to Admission medications   Medication Sig Start Date End Date Taking? Authorizing Provider  acetaminophen (TYLENOL) 325 MG tablet Take 2 tablets (650 mg total) by mouth every 6 (six) hours as needed for mild pain, headache or fever. 11/09/20   Angiulli, Lavon Paganini, PA-C  apixaban (ELIQUIS) 5 MG TABS tablet Take 1 tablet (5 mg total) by mouth 2 (two) times daily. 11/09/20   Angiulli, Lavon Paganini, PA-C  augmented betamethasone dipropionate (DIPROLENE-AF) 0.05 % ointment Apply 1 Application topically 2 (two) times daily as needed (rash on legs). 01/04/22   [provider]  Calcium Carbonate (CALCIUM-CARB 600 PO) Take 1 tablet by mouth daily. Gummy    [provider]  cetirizine (ZYRTEC) 10 MG tablet Take 10 mg daily as needed by mouth (seasonal allergies).     [provider]  Cholecalciferol (VITAMIN D) 50 MCG (2000 UT) tablet Take 1 tablet (2,000 Units total) by mouth daily. 11/09/20   Angiulli, Lavon Paganini, PA-C  DULoxetine (CYMBALTA) 30 MG capsule Take 30 mg by mouth at bedtime. 10/14/21   [provider]  fluticasone (FLONASE) 50 MCG/ACT nasal spray Place 1 spray daily as needed into both nostrils (seasonal allergies).  02/18/14   [provider]  FORTEO 600 MCG/2.4ML SOPN Inject 20 mcg into the skin at bedtime. 12/10/21   [place] Donnamarie Poag, DO  guaiFENesin (MUCINEX) 600 MG 12 hr tablet Take 1 tablet (600 mg total) by mouth 2 (two) times daily as needed. 10/17/22   Thurnell Lose, MD  hydrocortisone 2.5 % cream Apply 1 application topically daily as needed (itching). 06/17/21   [provider]  ipratropium (ATROVENT) 0.02 % nebulizer solution Take 2.5 mLs (0.5 mg total) by nebulization every 6 (six) hours. 10/17/22   Thurnell Lose, MD  levalbuterol (XOPENEX) 0.63 MG/3ML nebulizer solution Take 3 mLs (0.63 mg total) by nebulization every 8 (eight) hours as needed for wheezing or shortness of breath. 10/17/22   Thurnell Lose, MD  lidocaine (LIDODERM) 5 % Place 2 patches onto the skin daily. Remove & Discard patch within 12 hours or as directed by MD Patient taking differently: Place 2 patches onto the skin daily as needed (back pain). Remove & Discard patch within 12 hours or as directed by MD 11/09/20   Angiulli, Lavon Paganini, PA-C  metFORMIN (GLUCOPHAGE) 500 MG tablet Take 0.5 tablets (250 mg total) by mouth 2 (two) times daily with a meal. Patient  taking differently: Take 500 mg by mouth 2 (two) times daily with a meal. 11/09/20   Angiulli, Lavon Paganini, PA-C  methylPREDNISolone (MEDROL DOSEPAK) 4 MG TBPK tablet follow package directions 10/17/22   Thurnell Lose, MD  metoprolol tartrate (LOPRESSOR) 50 MG tablet Take 1 tablet (50 mg total) by mouth 2 (two) times daily. 10/17/22   Thurnell Lose, MD  mometasone (NASONEX) 50  MCG/ACT nasal spray Place 2 sprays into the nose daily. 10/17/22 10/17/23  Thurnell Lose, MD  nitroGLYCERIN (NITROSTAT) 0.4 MG SL tablet Place 1 tablet (0.4 mg total) under the tongue every 5 (five) minutes as needed for chest pain. 11/09/20   Angiulli, Lavon Paganini, PA-C  oxyCODONE (ROXICODONE) 5 MG/5ML solution Take 5 mLs (5 mg total) by mouth every 6 (six) hours as needed for up to 3 days for severe pain or breakthrough pain. 12/04/22 12/07/22  Wilnette Kales, PA  pantoprazole (PROTONIX) 40 MG tablet Take 1 tablet (40 mg total) by mouth daily. 10/18/22   Thurnell Lose, MD  pravastatin (PRAVACHOL) 20 MG tablet Take 1 tablet (20 mg total) by mouth at bedtime. 11/09/20   Angiulli, Lavon Paganini, PA-C  senna-docusate (SENOKOT-S) 8.6-50 MG tablet Take 1 tablet by mouth at bedtime as needed for mild constipation.    [provider]  tiZANidine (ZANAFLEX) 4 MG tablet Take 2-4 mg by mouth at bedtime as needed for muscle spasms. 10/05/21   [provider]  traMADol (ULTRAM) 50 MG tablet Take 1 tablet (50 mg total) by mouth every 6 (six) hours as needed for moderate pain. Patient taking differently: Take 50 mg by mouth every 8 (eight) hours as needed for moderate pain. 11/09/20   Angiulli, Lavon Paganini, PA-C  vitamin k 100 MCG tablet Take 100 mcg by mouth daily.    [provider]      Allergies    Bactrim [sulfamethoxazole-trimethoprim]    Review of Systems   Review of Systems  All other systems reviewed and are negative.   Physical Exam Updated Vital Signs BP 124/86 (BP Location: Left Arm)   Pulse 85   Temp 98.4 F (36.9 C) (Oral)   Resp 17   SpO2 98%  Physical Exam Vitals and nursing note reviewed.  Constitutional:      General: She is not in acute distress.    Appearance: She is well-developed.  HENT:     Head:     Comments: Patient with large hematoma noted on right side of face and developing right orbit, right lower face.  Patient unable to open right eye secondary  to swelling.   Small abrasion noted inferior right lip.  Right eye able to be pried open with bilateral pupils equal reactive light and accommodation with intact EOMs bilaterally. Subconjunctival hemorrhage appreciated on right side Eyes:     Conjunctiva/sclera: Conjunctivae normal.  Neck:     Comments: Patient with midline cervical tenderness; placed in c-collar. Cardiovascular:     Rate and Rhythm: Normal rate and regular rhythm.     Heart sounds: No murmur heard. Pulmonary:     Effort: Pulmonary effort is normal. No respiratory distress.     Breath sounds: Normal breath sounds. No wheezing or rales.     Comments: Midsternal tenderness Chest:     Chest wall: Tenderness present.  Abdominal:     Palpations: Abdomen is soft.     Tenderness: There is no abdominal tenderness.  Musculoskeletal:        General: No swelling.  Cervical back: Neck supple. Tenderness present. No rigidity.     Comments: Tender palpation right wrist and hand.  Tender to palpation bilateral knee.  No other upper or lower extremity tenderness with no obvious overlying skin abnormalities appreciated.  Midline tenderness of the cervical and thoracic spine with no obvious step-off or deformity noted.  No midline tenderness of lumbar spine with no obvious step-off or deformity noted.  Mild flank tenderness bilaterally.  Skin:    General: Skin is warm and dry.     Capillary Refill: Capillary refill takes less than 2 seconds.  Neurological:     Mental Status: She is alert.     Comments: Alert and oriented to self, place, time and event.   Speech is fluent, clear without dysarthria or dysphasia.   Patient moves all 4 extremities without difficulty with lower extremity strength greater in the left than right but patient states this is baseline.  Sensation intact in upper/lower extremities   Normal gait.  Negative Romberg. No pronator drift.  Normal finger-to-nose and feet tapping.  CN I not tested  CN II not  tested CN III, IV, VI PERRLA and EOMs intact bilaterally  CN V Intact sensation to sharp and light touch to the face  CN VII facial movements symmetric  CN VIII not tested  CN IX, X no uvula deviation, symmetric rise of soft palate  CN XI 5/5 SCM and trapezius strength bilaterally  CN XII Midline tongue protrusion, symmetric L/R movements     Psychiatric:        Mood and Affect: Mood normal.     ED Results / Procedures / Treatments   Labs (all labs ordered are listed, but only abnormal results are displayed) Labs Reviewed  COMPREHENSIVE METABOLIC PANEL - Abnormal; Notable for the following components:      Result Value   Glucose, Bld 118 (*)    AST 13 (*)    All other components within normal limits  CBC WITH DIFFERENTIAL/PLATELET - Abnormal; Notable for the following components:   RBC 3.32 (*)    Hemoglobin 11.3 (*)    HCT 34.5 (*)    MCV 103.9 (*)    All other components within normal limits    EKG None  Radiology CT T-SPINE NO CHARGE  Result Date: 12/04/2022 CLINICAL DATA:  [place].  Back pain. EXAM: CT THORACIC SPINE WITHOUT CONTRAST TECHNIQUE: Multidetector CT images of the thoracic were obtained using the standard protocol without intravenous contrast. RADIATION DOSE REDUCTION: This exam was performed according to the departmental dose-optimization program which includes automated exposure control, adjustment of the mA and/or kV according to patient size and/or use of iterative reconstruction technique. COMPARISON:  Chest CT from 02/07/2022 FINDINGS: Alignment: Normal Vertebrae: Stable osteoporosis but no acute thoracic spine fracture. Remote T12 fracture with vertebral plana appearance. Paraspinal and other soft tissues: No significant paraspinal findings. Disc levels: No large thoracic disc protrusions, significant spinal foraminal stenosis. Mild flattening of the ventral thecal sac at T11-12 due to mild retropulsion of T12. IMPRESSION: 1. Stable osteoporosis but no acute  thoracic spine fracture. 2. Remote T12 fracture with vertebral plana appearance. 3. Mild flattening of the ventral thecal sac at T11-12 due to mild retropulsion of T12. Electronically Signed   By: Marijo Sanes M.D.   On: 12/04/2022 16:24   CT Maxillofacial WO CM  Result Date: 12/04/2022 CLINICAL DATA:  [place].  Injured right-side of the face. EXAM: CT MAXILLOFACIAL WITHOUT CONTRAST TECHNIQUE: Multidetector CT imaging of the maxillofacial structures  was performed. Multiplanar CT image reconstructions were also generated. RADIATION DOSE REDUCTION: This exam was performed according to the departmental dose-optimization program which includes automated exposure control, adjustment of the mA and/or kV according to patient size and/or use of iterative reconstruction technique. COMPARISON:  None Available. FINDINGS: Osseous: No acute facial bone fractures are identified. The mandibular condyles are normally located. No mandible fracture. Orbits: No orbital fractures. The globes are intact. No intraorbital hematoma. Sinuses: The paranasal sinuses and mastoid air cells are clear. Soft tissues: Extensive hematoma involving the right periorbital and right facial area with a focal sizable hematoma measuring a maximum of 5.4 cm. Significant surrounding inflammations/edema and subcutaneous hemorrhage. No underlying facial bone fracture is identified. Limited intracranial: Advanced cerebral atrophy chronic white matter changes but no acute intracranial findings. IMPRESSION: 1. No acute facial bone fractures are identified. 2. Extensive hematoma involving the right periorbital and right facial area with a focal sizable hematoma measuring a maximum of 5.4 cm. Significant surrounding inflammations/edema and subcutaneous hemorrhage. Electronically Signed   By: Marijo Sanes M.D.   On: 12/04/2022 16:21   CT CHEST ABDOMEN PELVIS W CONTRAST  Result Date: 12/04/2022 CLINICAL DATA:  [place].  Hit face. EXAM: CT CHEST, ABDOMEN, AND  PELVIS WITH CONTRAST TECHNIQUE: Multidetector CT imaging of the chest, abdomen and pelvis was performed following the standard protocol during bolus administration of intravenous contrast. RADIATION DOSE REDUCTION: This exam was performed according to the departmental dose-optimization program which includes automated exposure control, adjustment of the mA and/or kV according to patient size and/or use of iterative reconstruction technique. CONTRAST:  124m OMNIPAQUE IOHEXOL 300 MG/ML  SOLN COMPARISON:  Prior chest CT from 02/07/2022 FINDINGS: CT CHEST FINDINGS Cardiovascular: The heart is upper limits of normal in size and stable. No pericardial effusion. The aorta is normal in caliber. No dissection. Stable atherosclerotic calcifications. Enlarged pulmonary artery suggesting pulmonary hypertension. No findings for pulmonary embolism. Mediastinum/Nodes: Scattered mediastinal and hilar lymph nodes appears stable. There is a small stable anterior mediastinal nodule. This measures 15 mm and 20 Hounsfield units and is most consistent with benign thymic cyst. The esophagus is grossly normal. Lungs/Pleura: Progressive/more pronounced mosaic pattern of ground-glass attenuation in the lungs typically seen with small airways disease such as asthma or constrictive bronchiolitis. Other possibilities would not cryptogenic organizing pneumonia and hypersensitivity pneumonitis. No focal airspace consolidation or pleural effusion. Musculoskeletal: The bony structures are intact. No rib, sternal or thoracic vertebral body fractures are identified. Chronic T1 compression fracture is noted. CT ABDOMEN PELVIS FINDINGS Hepatobiliary: No acute hepatic injury or intrahepatic biliary dilatation. The gallbladder is grossly normal. No common bile duct dilatation. Pancreas: No acute pancreatic injury or peripancreatic hematoma. No inflammation or ductal dilatation. Very large duodenal diverticulum noted in the pancreatic head. Spleen: No  acute splenic injury or perisplenic hematoma. Adrenals/Urinary Tract: Adrenal glands and kidneys are unremarkable. No evidence of acute renal injury or perinephric hematoma. The bladder is unremarkable. Stomach/Bowel: The stomach, duodenum, small bowel and colon are unremarkable. Vascular/Lymphatic: Scattered atherosclerotic calcifications involving the aorta and branch vessels but no aneurysm or dissection. The major venous structures are patent. No mesenteric or retroperitoneal mass, adenopathy or hematoma. Reproductive: Surgically absent. Other: No pelvic mass or adenopathy. No free pelvic fluid collections. No inguinal mass or adenopathy. No abdominal wall hernia or subcutaneous lesions. Musculoskeletal: Evidence of remote healed pelvic fractures. No acute pelvic or hip fracture is identified. The lumbar vertebral bodies are intact. IMPRESSION: 1. No acute injury is identified involving the chest, abdomen  or pelvis. 2. Progressive/more pronounced mosaic pattern of ground-glass attenuation in the lungs typically seen with small airways disease such as asthma or constrictive bronchiolitis. Other possibilities would not cryptogenic organizing pneumonia and hypersensitivity pneumonitis. Chronic pulmonary emboli is also possible. 3. Enlarged pulmonary arteries suggesting pulmonary hypertension. 4. Stable anterior mediastinal nodule, most consistent with benign thymic cyst. 5. Evidence of remote healed pelvic fractures. 6. Aortic atherosclerosis. Aortic Atherosclerosis (ICD10-I70.0). Electronically Signed   By: Marijo Sanes M.D.   On: 12/04/2022 16:15   CT Head Wo Contrast  Result Date: 12/04/2022 CLINICAL DATA:  [place].  Hit head. EXAM: CT HEAD WITHOUT CONTRAST CT CERVICAL SPINE WITHOUT CONTRAST TECHNIQUE: Multidetector CT imaging of the head and cervical spine was performed following the standard protocol without intravenous contrast. Multiplanar CT image reconstructions of the cervical spine were also generated.  RADIATION DOSE REDUCTION: This exam was performed according to the departmental dose-optimization program which includes automated exposure control, adjustment of the mA and/or kV according to patient size and/or use of iterative reconstruction technique. COMPARISON:  Head CT 12/09/2021 FINDINGS: CT HEAD FINDINGS Brain: Stable age related cerebral atrophy, ventriculomegaly and periventricular white matter disease. Stable benign basal ganglia calcifications. No extra-axial fluid collections are identified. No CT findings for acute hemispheric infarction or intracranial hemorrhage. No mass lesions. The brainstem and cerebellum are normal. Vascular: Stable vascular calcifications. No aneurysm or hyperdense vessels. Skull: No acute skull fracture. Sinuses/Orbits: The paranasal sinuses and mastoid air cells are clear. The globes are intact. Other: Right facial and periorbital soft tissue swelling with focal hematoma noted in the right maxillary region. No upper facial bone fractures are identified. CT CERVICAL SPINE FINDINGS Alignment: Normal Skull base and vertebrae: No acute fracture. No primary bone lesion or focal pathologic process. Soft tissues and spinal canal: No prevertebral fluid or swelling. No visible canal hematoma. Disc levels: The spinal canal is generous. No canal or foraminal stenosis. Upper chest: The lung apices demonstrate mosaic pattern of ground-glass attenuation but no pulmonary lesions. Advanced aortic calcifications. Aberrant right subclavian artery noted. Other: No neck mass, adenopathy or hematoma. IMPRESSION: 1. Stable age related cerebral atrophy, ventriculomegaly and periventricular white matter disease. 2. No acute intracranial findings or skull fracture. 3. Right facial and periorbital soft tissue swelling with focal hematoma in the right maxillary region. No upper facial bone fractures are identified. 4. Normal alignment of the cervical vertebral bodies and no acute fracture. 5. Aortic  atherosclerosis. Aortic Atherosclerosis (ICD10-I70.0). Electronically Signed   By: Marijo Sanes M.D.   On: 12/04/2022 16:06   CT Cervical Spine Wo Contrast  Result Date: 12/04/2022 CLINICAL DATA:  [place].  Hit head. EXAM: CT HEAD WITHOUT CONTRAST CT CERVICAL SPINE WITHOUT CONTRAST TECHNIQUE: Multidetector CT imaging of the head and cervical spine was performed following the standard protocol without intravenous contrast. Multiplanar CT image reconstructions of the cervical spine were also generated. RADIATION DOSE REDUCTION: This exam was performed according to the departmental dose-optimization program which includes automated exposure control, adjustment of the mA and/or kV according to patient size and/or use of iterative reconstruction technique. COMPARISON:  Head CT 12/09/2021 FINDINGS: CT HEAD FINDINGS Brain: Stable age related cerebral atrophy, ventriculomegaly and periventricular white matter disease. Stable benign basal ganglia calcifications. No extra-axial fluid collections are identified. No CT findings for acute hemispheric infarction or intracranial hemorrhage. No mass lesions. The brainstem and cerebellum are normal. Vascular: Stable vascular calcifications. No aneurysm or hyperdense vessels. Skull: No acute skull fracture. Sinuses/Orbits: The paranasal sinuses and mastoid air cells  are clear. The globes are intact. Other: Right facial and periorbital soft tissue swelling with focal hematoma noted in the right maxillary region. No upper facial bone fractures are identified. CT CERVICAL SPINE FINDINGS Alignment: Normal Skull base and vertebrae: No acute fracture. No primary bone lesion or focal pathologic process. Soft tissues and spinal canal: No prevertebral fluid or swelling. No visible canal hematoma. Disc levels: The spinal canal is generous. No canal or foraminal stenosis. Upper chest: The lung apices demonstrate mosaic pattern of ground-glass attenuation but no pulmonary lesions. Advanced  aortic calcifications. Aberrant right subclavian artery noted. Other: No neck mass, adenopathy or hematoma. IMPRESSION: 1. Stable age related cerebral atrophy, ventriculomegaly and periventricular white matter disease. 2. No acute intracranial findings or skull fracture. 3. Right facial and periorbital soft tissue swelling with focal hematoma in the right maxillary region. No upper facial bone fractures are identified. 4. Normal alignment of the cervical vertebral bodies and no acute fracture. 5. Aortic atherosclerosis. Aortic Atherosclerosis (ICD10-I70.0). Electronically Signed   By: Marijo Sanes M.D.   On: 12/04/2022 16:06   DG Knee Complete 4 Views Right  Result Date: 12/04/2022 CLINICAL DATA:  [place].  Right knee pain. EXAM: RIGHT KNEE - COMPLETE 4+ VIEW COMPARISON:  None Available. FINDINGS: Moderate tricompartmental degenerative changes but no acute fracture or osteochondral lesion. No definite joint effusion. IMPRESSION: Moderate tricompartmental degenerative changes but no acute bony findings. Electronically Signed   By: Marijo Sanes M.D.   On: 12/04/2022 16:02   DG Knee Complete 4 Views Left  Result Date: 12/04/2022 CLINICAL DATA:  [place].  Left knee pain. EXAM: LEFT KNEE - COMPLETE 4+ VIEW COMPARISON:  None. FINDINGS: Tricompartmental degenerative changes with joint space narrowing, spurring and subchondral cystic change. Large bony exostosis projecting off the medial femoral condyle. No acute fracture. No osteochondral lesion. No joint effusion IMPRESSION: Tricompartmental degenerative changes but no acute bony findings or joint effusion. Electronically Signed   By: Marijo Sanes M.D.   On: 12/04/2022 16:01   DG Wrist Complete Right  Result Date: 12/04/2022 CLINICAL DATA:  Fall EXAM: RIGHT WRIST - COMPLETE 3+ VIEW COMPARISON:  None Available. FINDINGS: No acute fracture or dislocation of the right wrist. Nonacute fracture fragment adjacent to the ulnar styloid. The carpus is flexed although  otherwise normally aligned. Mild arthrosis. Soft tissues unremarkable. IMPRESSION: 1. No acute fracture or dislocation of the right wrist. Nonacute fracture fragment adjacent to the ulnar styloid. 2. The carpus is flexed although otherwise normally aligned. Electronically Signed   By: Delanna Ahmadi M.D.   On: 12/04/2022 16:00   DG Hand Complete Right  Result Date: 12/04/2022 CLINICAL DATA:  [place] this morning.  Right hand pain. EXAM: RIGHT HAND - COMPLETE 3+ VIEW COMPARISON:  None Available. FINDINGS: The joint spaces are maintained. No acute hand or wrist fracture is identified. IMPRESSION: No acute bony findings. Electronically Signed   By: Marijo Sanes M.D.   On: 12/04/2022 16:00    Procedures Procedures    Medications Ordered in ED Medications  oxyCODONE-acetaminophen (PERCOCET/ROXICET) 5-325 MG per tablet 1 tablet (has no administration in time range)  fentaNYL (SUBLIMAZE) injection 50 mcg (50 mcg Intravenous Given 12/04/22 1412)  iohexol (OMNIPAQUE) 300 MG/ML solution 100 mL (100 mLs Intravenous Contrast Given 12/04/22 1524)  HYDROmorphone (DILAUDID) injection 0.5 mg (0.5 mg Intravenous Given 12/04/22 1641)    ED Course/ Medical Decision Making/ A&P Clinical Course as of 12/04/22 1944  Sun Dec 04, 2022  1656 CT Maxillofacial WO CM [CR]  [place] with neurosurgery [CR]    Clinical Course User Index [CR] Wilnette Kales, PA                             Medical Decision Making Amount and/or Complexity of Data Reviewed Labs: ordered. Radiology: ordered. Decision-making details documented in ED Course.  Risk Prescription drug management.   This patient presents to the ED for concern of fall, this involves an extensive number of treatment options, and is a complaint that carries with it a high risk of complications and morbidity.  The differential diagnosis includes CVA, fracture, strain chest pain, dislocation, pneumothorax, spinal cord injury   Co morbidities that  complicate the patient evaluation  See HPI   Additional history obtained:  Additional history obtained from EMR External records from outside source obtained and reviewed including hospital records   Lab Tests:  I Ordered, and personally interpreted labs.  The pertinent results include: No leukocytosis.  Evidence of anemia with a hemoglobin 11.3 of which is near patient's baseline.  Platelets within range.  No electrolyte abnormality appreciated.  No transaminitis.  No renal dysfunction.   Imaging Studies ordered:  I ordered imaging studies including CT CT head/C-spine/maxillofacial, CT chest abdomen pelvis, CT T-spine, x-ray right knee, x-ray of the left knee, x-ray right wrist/hand I independently visualized and interpreted imaging which showed  CT head/C-spine/maxillofacial: Stable age-related cerebral atrophy.  Ventriculomegaly and periventricular white matter disease.  No acute intracranial finding.  Right facial and periorbital soft tissue swelling with focal hematoma right maxillary region.  No upper facial bone fractures are identified.  Normal alignment of cervical vertebral bodies with no acute fracture.  Aortic atherosclerosis. CT chest abdomen pelvis: No acute abnormality.  Progressive more pronounced mosaic pattern of groundglass attenuation some lung.  Enlarged pulmonary arteries.  Stable anterior mediastinal nodule.  Remote healed pelvic fracture.  Aortic atherosclerosis. CT T-spine: Stable osteoporosis no acute fracture of T-spine.  Remote T12 fracture.  Mild flattening of the ventral thecal sac at T11-12 due to mild retropulsion of T12. X-ray right knee: Tricompartmental osteoarthritis with no acute abnormality X-ray of the left knee:Tricompartmental osteoarthritis with no acute abnormality X-ray right wrist/hand: No acute abnormalities.  Nonacute fracture fragment of ulnar styloid.  Carpus flexed otherwise normal alignment. I agree with the radiologist  interpretation  Cardiac Monitoring: / EKG:  The patient was maintained on a cardiac monitor.  I personally viewed and interpreted the cardiac monitored which showed an underlying rhythm of: Sinus rhythm   Consultations Obtained:  I requested consultation with attending physician Dr. Tyrone Nine who evaluated the patient independently and was agreement with treatment plan going forward  Problem List / ED Course / Critical interventions / Medication management  Fall I ordered medication including for, Dilaudid   Reevaluation of the patient after these medicines showed that the patient improved I have reviewed the patients home medicines and have made adjustments as needed   Social Determinants of Health:  Denies tobacco, illicit drug use   Test / Admission - Considered:  Fall Vitals signs within normal range and stable throughout visit. Laboratory/imaging studies significant for: See above Patient with evidence of mechanical fall.  Workup today overall negative for any acute abnormalities.  Patient resting comfortably on exam room bed.  Shared decision made conversation was had with family regarding level of care patient may require over the next several days at home given current health status.  Discussion was had for  TOC consultation but patient and daughter elected for at home management with return if unable.  Requesting outpatient social work resources.  Patient to be placed in TLSO brace for old T12 compression fracture. recommend follow-up outpatient with neurosurgery regarding said fracture.  She requested pain medicine will be prescribed for breakthrough pain.  Patient recommended Tylenol for baseline pain.  Recommended holding Eliquis for the next 24 hours to continue on Tuesday.  Patient educated to return to emergency department if new or worsening symptoms.  Recommend follow-up with primary care for reassessment next week regardless.  Treatment plan discussed at length with patient  and she acknowledged understanding was agreeable to said plan. Worrisome signs and symptoms were discussed with the patient and the patient knowledge understanding to return to emergency department with notice.  Patient was stable upon discharge.        Final Clinical Impression(s) / ED Diagnoses Final diagnoses:  Fall, initial encounter    Rx / DC Orders ED Discharge Orders          Ordered         oxyCODONE (ROXICODONE) 5 MG/5ML solution  Every 6 hours PRN        12/04/22 1943              Wilnette Kales, [place] 12/04/22 1948    Charlesetta Shanks, MD 12/08/22 1540

## 2022-12-04 NOTE — Progress Notes (Addendum)
Orthopedic Tech Progress Note Patient Details:  [name] May 22, 1941 [id number]  Stat order for a TLSO called into [facility].  Patient ID: [name], female   DOB: 1940/12/08, 82 y.o.   MRN: [id number]  [name] 12/04/2022, 7:10 PM

## 2022-12-06 ENCOUNTER — Other Ambulatory Visit (HOSPITAL_COMMUNITY): Payer: Self-pay

## 2022-12-06 DIAGNOSIS — R1310 Dysphagia, unspecified: Secondary | ICD-10-CM

## 2022-12-06 DIAGNOSIS — R059 Cough, unspecified: Secondary | ICD-10-CM

## 2022-12-10 ENCOUNTER — Emergency Department (HOSPITAL_BASED_OUTPATIENT_CLINIC_OR_DEPARTMENT_OTHER): Payer: Medicare Other

## 2022-12-10 ENCOUNTER — Other Ambulatory Visit: Payer: Self-pay

## 2022-12-10 ENCOUNTER — Emergency Department (HOSPITAL_BASED_OUTPATIENT_CLINIC_OR_DEPARTMENT_OTHER)
Admission: EM | Admit: 2022-12-10 | Discharge: 2022-12-10 | Disposition: A | Discharge status: Home / Self Care | Payer: Medicare Other | Attending: Emergency Medicine | Admitting: Emergency Medicine

## 2022-12-10 ENCOUNTER — Emergency Department (HOSPITAL_BASED_OUTPATIENT_CLINIC_OR_DEPARTMENT_OTHER): Payer: Medicare Other | Admitting: Radiology

## 2022-12-10 DIAGNOSIS — Z7901 Long term (current) use of anticoagulants: Secondary | ICD-10-CM | POA: Diagnosis not present

## 2022-12-10 DIAGNOSIS — M542 Cervicalgia: Secondary | ICD-10-CM | POA: Diagnosis not present

## 2022-12-10 DIAGNOSIS — W01198A Fall on same level from slipping, tripping and stumbling with subsequent striking against other object, initial encounter: Secondary | ICD-10-CM | POA: Insufficient documentation

## 2022-12-10 DIAGNOSIS — E119 Type 2 diabetes mellitus without complications: Secondary | ICD-10-CM | POA: Insufficient documentation

## 2022-12-10 DIAGNOSIS — Y92009 Unspecified place in unspecified non-institutional (private) residence as the place of occurrence of the external cause: Secondary | ICD-10-CM | POA: Diagnosis not present

## 2022-12-10 DIAGNOSIS — W19XXXA Unspecified fall, initial encounter: Secondary | ICD-10-CM

## 2022-12-10 DIAGNOSIS — Z8673 Personal history of transient ischemic attack (TIA), and cerebral infarction without residual deficits: Secondary | ICD-10-CM | POA: Insufficient documentation

## 2022-12-10 DIAGNOSIS — I6782 Cerebral ischemia: Secondary | ICD-10-CM | POA: Insufficient documentation

## 2022-12-10 DIAGNOSIS — R079 Chest pain, unspecified: Secondary | ICD-10-CM | POA: Diagnosis not present

## 2022-12-10 DIAGNOSIS — Z7984 Long term (current) use of oral hypoglycemic drugs: Secondary | ICD-10-CM | POA: Diagnosis not present

## 2022-12-10 DIAGNOSIS — S0990XA Unspecified injury of head, initial encounter: Secondary | ICD-10-CM

## 2022-12-10 DIAGNOSIS — S298XXA Other specified injuries of thorax, initial encounter: Secondary | ICD-10-CM

## 2022-12-10 NOTE — ED Provider Notes (Signed)
[facility] Provider Note   CSN: [id number] Arrival date & time: 12/10/22  1728     History  Chief Complaint  Patient presents with   [facility] is a 82 y.o. female.   Fall  Patient presents after fall.  Fell at home.  Walks with walker.  Knees gave out and fell backward striking her head.  No loss consciousness.  Has had recent falls and has swelling in her face from it.  However is on Eliquis.  Has had chronic A-fib and has had previous stroke after being taken off her Coumadin so started on Eliquis.  Complaining of some pain in her left upper chest.  Also some mild neck pain.    Past Medical History:  Diagnosis Date   Atrial fibrillation ([place])    Chronic back pain    "mid back down into lower back" (08/25/2014)   Diabetes mellitus without complication (HCC)    Frequent falls    GERD (gastroesophageal reflux disease)    Hypertriglyceridemia    OSA on CPAP    Osteoarthritis    "knees, hands, back" (08/25/2014)   Pneumonia ~ 2000 X 1   Subdural hematoma ([place]) july 2015   S/P fall while on Coumadin   T12 compression fracture ([place]) 2012   Type II diabetes mellitus ([place])     Home Medications Prior to Admission medications   Medication Sig Start Date End Date Taking? Authorizing Provider  acetaminophen (TYLENOL) 325 MG tablet Take 2 tablets (650 mg total) by mouth every 6 (six) hours as needed for mild pain, headache or fever. 11/09/20   Angiulli, Lavon Paganini, PA-C  apixaban (ELIQUIS) 5 MG TABS tablet Take 1 tablet (5 mg total) by mouth 2 (two) times daily. 11/09/20   Angiulli, Lavon Paganini, PA-C  augmented betamethasone dipropionate (DIPROLENE-AF) 0.05 % ointment Apply 1 Application topically 2 (two) times daily as needed (rash on legs). 01/04/22   [provider]  Calcium Carbonate (CALCIUM-CARB 600 PO) Take 1 tablet by mouth daily. Gummy    [provider]  cetirizine (ZYRTEC) 10 MG tablet Take 10 mg daily as  needed by mouth (seasonal allergies).    [provider]  Cholecalciferol (VITAMIN D) 50 MCG (2000 UT) tablet Take 1 tablet (2,000 Units total) by mouth daily. 11/09/20   Angiulli, Lavon Paganini, PA-C  DULoxetine (CYMBALTA) 30 MG capsule Take 30 mg by mouth at bedtime. 10/14/21   [provider]  fluticasone (FLONASE) 50 MCG/ACT nasal spray Place 1 spray daily as needed into both nostrils (seasonal allergies).  02/18/14   [provider]  FORTEO 600 MCG/2.4ML SOPN Inject 20 mcg into the skin at bedtime. 12/10/21   [place] Donnamarie Poag, DO  guaiFENesin (MUCINEX) 600 MG 12 hr tablet Take 1 tablet (600 mg total) by mouth 2 (two) times daily as needed. 10/17/22   Thurnell Lose, MD  hydrocortisone 2.5 % cream Apply 1 application topically daily as needed (itching). 06/17/21   [provider]  ipratropium (ATROVENT) 0.02 % nebulizer solution Take 2.5 mLs (0.5 mg total) by nebulization every 6 (six) hours. 10/17/22   Thurnell Lose, MD  levalbuterol (XOPENEX) 0.63 MG/3ML nebulizer solution Take 3 mLs (0.63 mg total) by nebulization every 8 (eight) hours as needed for wheezing or shortness of breath. 10/17/22   Thurnell Lose, MD  lidocaine (LIDODERM) 5 % Place 2 patches onto the skin daily. Remove & Discard patch within 12 hours or  as directed by MD Patient taking differently: Place 2 patches onto the skin daily as needed (back pain). Remove & Discard patch within 12 hours or as directed by MD 11/09/20   [place], Lavon Paganini, PA-C  metFORMIN (GLUCOPHAGE) 500 MG tablet Take 0.5 tablets (250 mg total) by mouth 2 (two) times daily with a meal. Patient taking differently: Take 500 mg by mouth 2 (two) times daily with a meal. 11/09/20   Angiulli, Lavon Paganini, PA-C  methylPREDNISolone (MEDROL DOSEPAK) 4 MG TBPK tablet follow package directions 10/17/22   Thurnell Lose, MD  metoprolol tartrate (LOPRESSOR) 50 MG tablet Take 1 tablet (50 mg total) by mouth 2 (two) times daily. 10/17/22   Thurnell Lose, MD  mometasone (NASONEX) 50 MCG/ACT nasal spray Place 2 sprays into the nose daily. 10/17/22 10/17/23  Thurnell Lose, MD  nitroGLYCERIN (NITROSTAT) 0.4 MG SL tablet Place 1 tablet (0.4 mg total) under the tongue every 5 (five) minutes as needed for chest pain. 11/09/20   Angiulli, Lavon Paganini, PA-C  pantoprazole (PROTONIX) 40 MG tablet Take 1 tablet (40 mg total) by mouth daily. 10/18/22   Thurnell Lose, MD  pravastatin (PRAVACHOL) 20 MG tablet Take 1 tablet (20 mg total) by mouth at bedtime. 11/09/20   Angiulli, Lavon Paganini, PA-C  senna-docusate (SENOKOT-S) 8.6-50 MG tablet Take 1 tablet by mouth at bedtime as needed for mild constipation.    [provider]  tiZANidine (ZANAFLEX) 4 MG tablet Take 2-4 mg by mouth at bedtime as needed for muscle spasms. 10/05/21   [provider]  traMADol (ULTRAM) 50 MG tablet Take 1 tablet (50 mg total) by mouth every 6 (six) hours as needed for moderate pain. Patient taking differently: Take 50 mg by mouth every 8 (eight) hours as needed for moderate pain. 11/09/20   Angiulli, Lavon Paganini, PA-C  vitamin k 100 MCG tablet Take 100 mcg by mouth daily.    [provider]      Allergies    Bactrim [sulfamethoxazole-trimethoprim]    Review of Systems   Review of Systems  Physical Exam Updated Vital Signs BP (!) 112/56 (BP Location: Left Arm)   Pulse 78   Temp 97.6 F (36.4 C) (Oral)   Resp 18   SpO2 100%  Physical Exam Vitals and nursing note reviewed.  Eyes:     Extraocular Movements: Extraocular movements intact.  Neck:     Comments: Cervical collar in place.  No midline tenderness.  Does have some ecchymosis down the neck from previous fall. Cardiovascular:     Rate and Rhythm: Normal rate.  Pulmonary:     Comments: Tender right upper anterior chest wall.  No crepitance.  No subcu emphysema. Chest:     Chest wall: Tenderness present.  Abdominal:     Tenderness: There is no abdominal tenderness.  Musculoskeletal:      Cervical back: Neck supple.     Comments: Note.  Tenderness of extremities.  Neurological:     Mental Status: She is alert. Mental status is at baseline.     ED Results / Procedures / Treatments   Labs (all labs ordered are listed, but only abnormal results are displayed) Labs Reviewed - No data to display  EKG None  Radiology DG [facility] 1 View  Result Date: 12/10/2022 CLINICAL DATA:  Fall. EXAM: PORTABLE CHEST 1 VIEW COMPARISON:  Chest radiographs 10/15/2022, 10/13/2022, 12/09/2021; CT chest 12/04/2022 FINDINGS: Cardiac silhouette is again moderately enlarged. Mediastinal contours are unchanged with  moderate calcification within the aortic arch. Mild chronic interstitial thickening. Improved aeration of the right upper lung compared to 10/15/2022. No acute airspace opacity is seen. No pleural effusion or pneumothorax. Mild dextrocurvature of the lower thoracic spine and mild levocurvature of the upper lumbar spine. IMPRESSION: 1. No acute lung process. 2. Chronic cardiomegaly and mild chronic interstitial thickening. Electronically Signed   By: Yvonne Kendall M.D.   On: 12/10/2022 18:58   CT Head Wo Contrast  Result Date: 12/10/2022 CLINICAL DATA:  82 year old female with head and neck injury from fall. Initial encounter. EXAM: CT HEAD WITHOUT CONTRAST CT CERVICAL SPINE WITHOUT CONTRAST TECHNIQUE: Multidetector CT imaging of the head and cervical spine was performed following the standard protocol without intravenous contrast. Multiplanar CT image reconstructions of the cervical spine were also generated. RADIATION DOSE REDUCTION: This exam was performed according to the departmental dose-optimization program which includes automated exposure control, adjustment of the mA and/or kV according to patient size and/or use of iterative reconstruction technique. COMPARISON:  12/04/2022 and prior CTs FINDINGS: CT HEAD FINDINGS Brain: No evidence of acute infarction, hemorrhage, hydrocephalus  or mass lesion/mass effect. Atrophy, remote RIGHT basal ganglia infarct and chronic small-vessel white matter ischemic changes again noted. Vascular: Carotid atherosclerotic calcifications are noted. Skull: Normal. Negative for fracture or focal lesion. Sinuses/Orbits: No acute finding. Other: RIGHT facial soft tissue swelling/hematoma noted. CT CERVICAL SPINE FINDINGS Alignment: Normal. Skull base and vertebrae: No acute fracture. No primary bone lesion or focal pathologic process. Soft tissues and spinal canal: No prevertebral fluid or swelling. No visible canal hematoma. Disc levels:  Unremarkable Upper chest: No acute abnormality Other: None IMPRESSION: 1. No evidence of acute intracranial abnormality. Atrophy, remote RIGHT basal ganglia infarct and chronic small-vessel white matter ischemic changes. 2. RIGHT facial soft tissue swelling/hematoma. 3. No static evidence of acute injury to the cervical spine. Electronically Signed   By: Margarette Canada M.D.   On: 12/10/2022 18:20   CT CERVICAL SPINE WO CONTRAST  Result Date: 12/10/2022 CLINICAL DATA:  82 year old female with head and neck injury from fall. Initial encounter. EXAM: CT HEAD WITHOUT CONTRAST CT CERVICAL SPINE WITHOUT CONTRAST TECHNIQUE: Multidetector CT imaging of the head and cervical spine was performed following the standard protocol without intravenous contrast. Multiplanar CT image reconstructions of the cervical spine were also generated. RADIATION DOSE REDUCTION: This exam was performed according to the departmental dose-optimization program which includes automated exposure control, adjustment of the mA and/or kV according to patient size and/or use of iterative reconstruction technique. COMPARISON:  12/04/2022 and prior CTs FINDINGS: CT HEAD FINDINGS Brain: No evidence of acute infarction, hemorrhage, hydrocephalus or mass lesion/mass effect. Atrophy, remote RIGHT basal ganglia infarct and chronic small-vessel white matter ischemic changes  again noted. Vascular: Carotid atherosclerotic calcifications are noted. Skull: Normal. Negative for fracture or focal lesion. Sinuses/Orbits: No acute finding. Other: RIGHT facial soft tissue swelling/hematoma noted. CT CERVICAL SPINE FINDINGS Alignment: Normal. Skull base and vertebrae: No acute fracture. No primary bone lesion or focal pathologic process. Soft tissues and spinal canal: No prevertebral fluid or swelling. No visible canal hematoma. Disc levels:  Unremarkable Upper chest: No acute abnormality Other: None IMPRESSION: 1. No evidence of acute intracranial abnormality. Atrophy, remote RIGHT basal ganglia infarct and chronic small-vessel white matter ischemic changes. 2. RIGHT facial soft tissue swelling/hematoma. 3. No static evidence of acute injury to the cervical spine. Electronically Signed   By: Margarette Canada M.D.   On: 12/10/2022 18:20    Procedures Procedures  Medications Ordered in ED Medications - No data to display  ED Course/ Medical Decision Making/ A&P                             Medical Decision Making Amount and/or Complexity of Data Reviewed Radiology: ordered.   Patient with fall.  History of same and is on anticoagulation.  Discussed with patient family ember about evaluating need for the anticoagulation with recent falls.  However did fall and hit head.  Mild neck pain.  Head CT and cervical spine CT done and reassuring.  Cervical collar removed.  However does have some right chest tenderness.  Will get x-ray.  Mechanical fall.  Should be able to discharge home with outpatient follow-up.  Imaging reassuring.  Head CT shows no bleed.  Independently interpreted.  Chest x-ray also reassuring.  Discharge home with outpatient follow-up.        Final Clinical Impression(s) / ED Diagnoses Final diagnoses:  Fall, initial encounter  Minor head injury, initial encounter  Blunt chest trauma, initial encounter    Rx / DC Orders ED Discharge Orders     None          Davonna Belling, MD 12/10/22 [phone]

## 2022-12-10 NOTE — ED Triage Notes (Signed)
Pt arrived POV with her daughter in wheelchair. Pt alert at baseline with "some confusion normally" per daughter. Pt's daughter reports at approx 1600 today pt fall backwards in the bathroom d/t "knees giving out" further stating she hit the back of her head on the edge of the bathtub and is c/o pain in the back of the head along with pain in the L side of the neck. Pt does take Eliquis. Bruising and swelling to the face and neck from fall 2/18. No obvious injury to back of head at present. C-collar placed in triage.

## 2022-12-19 ENCOUNTER — Telehealth (HOSPITAL_COMMUNITY): Payer: Self-pay | Admitting: *Deleted

## 2022-12-19 NOTE — Telephone Encounter (Signed)
Attempted to contact patient via interpreter services to reschedule 12/20/22 OP MBS due to equipment being down. Left VM for cancellation and asked for a return call to reschedule appointment. RKEEL

## 2022-12-20 ENCOUNTER — Encounter (HOSPITAL_COMMUNITY): Payer: Medicare Other

## 2022-12-20 ENCOUNTER — Ambulatory Visit (HOSPITAL_COMMUNITY): Payer: Medicare Other

## 2022-12-20 ENCOUNTER — Encounter (HOSPITAL_COMMUNITY): Payer: Self-pay

## 2023-01-04 ENCOUNTER — Encounter (HOSPITAL_COMMUNITY): Payer: Self-pay

## 2023-01-04 ENCOUNTER — Other Ambulatory Visit: Payer: Self-pay

## 2023-01-04 ENCOUNTER — Inpatient Hospital Stay (HOSPITAL_COMMUNITY)
Admission: EM | Admit: 2023-01-04 | Discharge: 2023-01-14 | DRG: 871 | Disposition: A | Discharge status: Rehabilitation Facility | Payer: Medicare Other | Attending: Internal Medicine | Admitting: Internal Medicine

## 2023-01-04 ENCOUNTER — Emergency Department (HOSPITAL_COMMUNITY): Payer: Medicare Other

## 2023-01-04 DIAGNOSIS — Z8673 Personal history of transient ischemic attack (TIA), and cerebral infarction without residual deficits: Secondary | ICD-10-CM | POA: Diagnosis not present

## 2023-01-04 DIAGNOSIS — R6520 Severe sepsis without septic shock: Secondary | ICD-10-CM | POA: Diagnosis present

## 2023-01-04 DIAGNOSIS — J189 Pneumonia, unspecified organism: Secondary | ICD-10-CM | POA: Diagnosis not present

## 2023-01-04 DIAGNOSIS — B370 Candidal stomatitis: Secondary | ICD-10-CM | POA: Diagnosis present

## 2023-01-04 DIAGNOSIS — Z7901 Long term (current) use of anticoagulants: Secondary | ICD-10-CM | POA: Diagnosis not present

## 2023-01-04 DIAGNOSIS — J9601 Acute respiratory failure with hypoxia: Secondary | ICD-10-CM | POA: Diagnosis present

## 2023-01-04 DIAGNOSIS — D539 Nutritional anemia, unspecified: Secondary | ICD-10-CM | POA: Diagnosis present

## 2023-01-04 DIAGNOSIS — J690 Pneumonitis due to inhalation of food and vomit: Secondary | ICD-10-CM | POA: Diagnosis present

## 2023-01-04 DIAGNOSIS — I4891 Unspecified atrial fibrillation: Secondary | ICD-10-CM | POA: Diagnosis present

## 2023-01-04 DIAGNOSIS — E781 Pure hyperglyceridemia: Secondary | ICD-10-CM | POA: Diagnosis present

## 2023-01-04 DIAGNOSIS — I482 Chronic atrial fibrillation, unspecified: Secondary | ICD-10-CM | POA: Diagnosis present

## 2023-01-04 DIAGNOSIS — M810 Age-related osteoporosis without current pathological fracture: Secondary | ICD-10-CM | POA: Diagnosis present

## 2023-01-04 DIAGNOSIS — I739 Peripheral vascular disease, unspecified: Secondary | ICD-10-CM | POA: Diagnosis not present

## 2023-01-04 DIAGNOSIS — Z79899 Other long term (current) drug therapy: Secondary | ICD-10-CM

## 2023-01-04 DIAGNOSIS — E119 Type 2 diabetes mellitus without complications: Secondary | ICD-10-CM

## 2023-01-04 DIAGNOSIS — G4733 Obstructive sleep apnea (adult) (pediatric): Secondary | ICD-10-CM | POA: Diagnosis present

## 2023-01-04 DIAGNOSIS — R1310 Dysphagia, unspecified: Secondary | ICD-10-CM | POA: Diagnosis not present

## 2023-01-04 DIAGNOSIS — R5381 Other malaise: Secondary | ICD-10-CM | POA: Diagnosis not present

## 2023-01-04 DIAGNOSIS — I2721 Secondary pulmonary arterial hypertension: Secondary | ICD-10-CM | POA: Diagnosis present

## 2023-01-04 DIAGNOSIS — K224 Dyskinesia of esophagus: Secondary | ICD-10-CM | POA: Diagnosis present

## 2023-01-04 DIAGNOSIS — A419 Sepsis, unspecified organism: Secondary | ICD-10-CM | POA: Diagnosis present

## 2023-01-04 DIAGNOSIS — I951 Orthostatic hypotension: Secondary | ICD-10-CM | POA: Diagnosis not present

## 2023-01-04 DIAGNOSIS — I361 Nonrheumatic tricuspid (valve) insufficiency: Secondary | ICD-10-CM | POA: Diagnosis not present

## 2023-01-04 DIAGNOSIS — K219 Gastro-esophageal reflux disease without esophagitis: Secondary | ICD-10-CM | POA: Diagnosis present

## 2023-01-04 DIAGNOSIS — Z7984 Long term (current) use of oral hypoglycemic drugs: Secondary | ICD-10-CM

## 2023-01-04 DIAGNOSIS — I340 Nonrheumatic mitral (valve) insufficiency: Secondary | ICD-10-CM | POA: Diagnosis not present

## 2023-01-04 DIAGNOSIS — R0602 Shortness of breath: Principal | ICD-10-CM

## 2023-01-04 DIAGNOSIS — D7589 Other specified diseases of blood and blood-forming organs: Secondary | ICD-10-CM | POA: Diagnosis present

## 2023-01-04 DIAGNOSIS — J302 Other seasonal allergic rhinitis: Secondary | ICD-10-CM | POA: Diagnosis not present

## 2023-01-04 DIAGNOSIS — Z90710 Acquired absence of both cervix and uterus: Secondary | ICD-10-CM

## 2023-01-04 DIAGNOSIS — K5900 Constipation, unspecified: Secondary | ICD-10-CM | POA: Diagnosis present

## 2023-01-04 DIAGNOSIS — Z1152 Encounter for screening for COVID-19: Secondary | ICD-10-CM

## 2023-01-04 DIAGNOSIS — D649 Anemia, unspecified: Secondary | ICD-10-CM | POA: Diagnosis not present

## 2023-01-04 DIAGNOSIS — Z9981 Dependence on supplemental oxygen: Secondary | ICD-10-CM

## 2023-01-04 DIAGNOSIS — R4181 Age-related cognitive decline: Secondary | ICD-10-CM | POA: Diagnosis not present

## 2023-01-04 DIAGNOSIS — J9600 Acute respiratory failure, unspecified whether with hypoxia or hypercapnia: Secondary | ICD-10-CM | POA: Diagnosis not present

## 2023-01-04 DIAGNOSIS — G9341 Metabolic encephalopathy: Secondary | ICD-10-CM | POA: Diagnosis present

## 2023-01-04 DIAGNOSIS — E1165 Type 2 diabetes mellitus with hyperglycemia: Secondary | ICD-10-CM | POA: Diagnosis present

## 2023-01-04 DIAGNOSIS — E876 Hypokalemia: Secondary | ICD-10-CM | POA: Diagnosis not present

## 2023-01-04 DIAGNOSIS — R059 Cough, unspecified: Secondary | ICD-10-CM | POA: Diagnosis not present

## 2023-01-04 LAB — I-STAT ARTERIAL BLOOD GAS, ED
Acid-base deficit: 2 mmol/L (ref 0.0–2.0)
Bicarbonate: 24 mmol/L (ref 20.0–28.0)
Calcium, Ion: 1.23 mmol/L (ref 1.15–1.40)
HCT: 26 % — ABNORMAL LOW (ref 36.0–46.0)
Hemoglobin: 8.8 g/dL — ABNORMAL LOW (ref 12.0–15.0)
O2 Saturation: 97 %
Patient temperature: 97.7
Potassium: 3.2 mmol/L — ABNORMAL LOW (ref 3.5–5.1)
Sodium: 141 mmol/L (ref 135–145)
TCO2: 25 mmol/L (ref 22–32)
pCO2 arterial: 45.1 mmHg (ref 32–48)
pH, Arterial: 7.331 — ABNORMAL LOW (ref 7.35–7.45)
pO2, Arterial: 95 mmHg (ref 83–108)

## 2023-01-04 LAB — COMPREHENSIVE METABOLIC PANEL
ALT: 9 U/L (ref 0–44)
AST: 18 U/L (ref 15–41)
Albumin: 3.5 g/dL (ref 3.5–5.0)
Alkaline Phosphatase: 62 U/L (ref 38–126)
Anion gap: 11 (ref 5–15)
BUN: 10 mg/dL (ref 8–23)
CO2: 24 mmol/L (ref 22–32)
Calcium: 8.8 mg/dL — ABNORMAL LOW (ref 8.9–10.3)
Chloride: 102 mmol/L (ref 98–111)
Creatinine, Ser: 0.66 mg/dL (ref 0.44–1.00)
GFR, Estimated: >60 mL/min (ref >60)
Glucose, Bld: 152 mg/dL — ABNORMAL HIGH (ref 70–99)
Potassium: 3.7 mmol/L (ref 3.5–5.1)
Sodium: 137 mmol/L (ref 135–145)
Total Bilirubin: 1 mg/dL (ref 0.3–1.2)
Total Protein: 6.4 g/dL — ABNORMAL LOW (ref 6.5–8.1)

## 2023-01-04 LAB — CBC WITH DIFFERENTIAL/PLATELET
Abs Immature Granulocytes: 0.2 10*3/uL — ABNORMAL HIGH (ref 0.00–0.07)
Basophils Absolute: 0.1 10*3/uL (ref 0.0–0.1)
Basophils Relative: 1 %
Eosinophils Absolute: 0.1 10*3/uL (ref 0.0–0.5)
Eosinophils Relative: 1 %
HCT: 30.9 % — ABNORMAL LOW (ref 36.0–46.0)
Hemoglobin: 9.8 g/dL — ABNORMAL LOW (ref 12.0–15.0)
Immature Granulocytes: 1 %
Lymphocytes Relative: 49 %
Lymphs Abs: 6.9 10*3/uL — ABNORMAL HIGH (ref 0.7–4.0)
MCH: 33.7 pg (ref 26.0–34.0)
MCHC: 31.7 g/dL (ref 30.0–36.0)
MCV: 106.2 fL — ABNORMAL HIGH (ref 80.0–100.0)
Monocytes Absolute: 1.8 10*3/uL — ABNORMAL HIGH (ref 0.1–1.0)
Monocytes Relative: 13 %
Neutro Abs: 4.9 10*3/uL (ref 1.7–7.7)
Neutrophils Relative %: 35 %
Platelets: 409 10*3/uL — ABNORMAL HIGH (ref 150–400)
RBC: 2.91 MIL/uL — ABNORMAL LOW (ref 3.87–5.11)
RDW: 14 % (ref 11.5–15.5)
WBC: 14.1 10*3/uL — ABNORMAL HIGH (ref 4.0–10.5)
nRBC: 0.1 % (ref 0.0–0.2)

## 2023-01-04 LAB — LACTIC ACID, PLASMA
Lactic Acid, Venous: 1.8 mmol/L (ref 0.5–1.9)
Lactic Acid, Venous: 2.6 mmol/L (ref 0.5–1.9)
Lactic Acid, Venous: 2.4 mmol/L (ref 0.5–1.9)
Lactic Acid, Venous: 1.1 mmol/L (ref 0.5–1.9)

## 2023-01-04 LAB — RESPIRATORY PANEL BY PCR

## 2023-01-04 LAB — RESP PANEL BY RT-PCR (RSV, FLU A&B, COVID)  RVPGX2
Influenza A by PCR: NEGATIVE
Influenza B by PCR: NEGATIVE
Resp Syncytial Virus by PCR: NEGATIVE
SARS Coronavirus 2 by RT PCR: NEGATIVE

## 2023-01-04 LAB — GLUCOSE, CAPILLARY: Glucose-Capillary: 187 mg/dL — ABNORMAL HIGH (ref 70–99)

## 2023-01-04 LAB — STREP PNEUMONIAE URINARY ANTIGEN: Strep Pneumo Urinary Antigen: NEGATIVE

## 2023-01-04 LAB — MRSA NEXT GEN BY PCR, NASAL: MRSA by PCR Next Gen: NOT DETECTED

## 2023-01-04 LAB — MAGNESIUM: Magnesium: 1.8 mg/dL (ref 1.7–2.4)

## 2023-01-04 LAB — LIPASE, BLOOD: Lipase: 27 U/L (ref 11–51)

## 2023-01-04 MED ORDER — SODIUM CHLORIDE 0.9 % IV SOLN
500.0000 mg | Freq: Once | INTRAVENOUS | Status: AC
  Administered 2023-01-04: 500 mg via INTRAVENOUS
  Filled 2023-01-04: qty 5

## 2023-01-04 MED ORDER — GUAIFENESIN ER 600 MG PO TB12: 600.0000 mg | ORAL_TABLET | Freq: Two times a day (BID) | ORAL | Status: DC | PRN

## 2023-01-04 MED ORDER — SODIUM CHLORIDE 0.9 % IV SOLN
1.0000 g | Freq: Once | INTRAVENOUS | Status: AC
  Administered 2023-01-04: 1 g via INTRAVENOUS
  Filled 2023-01-04: qty 10

## 2023-01-04 MED ORDER — INSULIN ASPART 100 UNIT/ML IJ SOLN
0.0000 [IU] | Freq: Three times a day (TID) | INTRAMUSCULAR | Status: DC
  Administered 2023-01-05 – 2023-01-10 (×12): 2 [IU] via SUBCUTANEOUS
  Administered 2023-01-10 – 2023-01-13 (×5): 3 [IU] via SUBCUTANEOUS
  Administered 2023-01-13: 8 [IU] via SUBCUTANEOUS
  Administered 2023-01-14: 5 [IU] via SUBCUTANEOUS

## 2023-01-04 MED ORDER — PRAVASTATIN SODIUM 40 MG PO TABS
20.0000 mg | ORAL_TABLET | Freq: Every day | ORAL | Status: DC
  Administered 2023-01-04 – 2023-01-13 (×10): 20 mg via ORAL
  Filled 2023-01-04 (×10): qty 1

## 2023-01-04 MED ORDER — LACTATED RINGERS IV BOLUS
1000.0000 mL | Freq: Once | INTRAVENOUS | Status: AC
  Administered 2023-01-04: 1000 mL via INTRAVENOUS

## 2023-01-04 MED ORDER — IPRATROPIUM-ALBUTEROL 0.5-2.5 (3) MG/3ML IN SOLN
3.0000 mL | RESPIRATORY_TRACT | Status: DC | PRN
  Administered 2023-01-04: 3 mL via RESPIRATORY_TRACT
  Filled 2023-01-04: qty 3

## 2023-01-04 MED ORDER — APIXABAN 5 MG PO TABS
5.0000 mg | ORAL_TABLET | Freq: Two times a day (BID) | ORAL | Status: DC
  Administered 2023-01-04 – 2023-01-06 (×4): 5 mg via ORAL
  Filled 2023-01-04 (×4): qty 1

## 2023-01-04 MED ORDER — IPRATROPIUM-ALBUTEROL 0.5-2.5 (3) MG/3ML IN SOLN
3.0000 mL | RESPIRATORY_TRACT | Status: DC
  Administered 2023-01-04: 3 mL via RESPIRATORY_TRACT

## 2023-01-04 MED ORDER — SODIUM CHLORIDE 0.9 % IV SOLN
1.5000 g | Freq: Four times a day (QID) | INTRAVENOUS | Status: DC
  Administered 2023-01-04 – 2023-01-11 (×26): 1.5 g via INTRAVENOUS
  Filled 2023-01-04 (×31): qty 4

## 2023-01-04 MED ORDER — IOHEXOL 350 MG/ML SOLN
53.0000 mL | Freq: Once | INTRAVENOUS | Status: AC | PRN
  Administered 2023-01-04: 53 mL via INTRAVENOUS

## 2023-01-04 MED ORDER — DULOXETINE HCL 30 MG PO CPEP
30.0000 mg | ORAL_CAPSULE | Freq: Every day | ORAL | Status: DC
  Administered 2023-01-04 – 2023-01-13 (×10): 30 mg via ORAL
  Filled 2023-01-04 (×10): qty 1

## 2023-01-04 MED ORDER — POTASSIUM CHLORIDE 10 MEQ/100ML IV SOLN
10.0000 meq | INTRAVENOUS | Status: AC
  Administered 2023-01-04 – 2023-01-05 (×3): 10 meq via INTRAVENOUS
  Filled 2023-01-04 (×3): qty 100

## 2023-01-04 MED ORDER — SENNA 8.6 MG PO TABS
1.0000 | ORAL_TABLET | Freq: Two times a day (BID) | ORAL | Status: DC
  Administered 2023-01-04 – 2023-01-10 (×12): 8.6 mg via ORAL
  Filled 2023-01-04 (×12): qty 1

## 2023-01-04 MED ORDER — POLYETHYLENE GLYCOL 3350 17 G PO PACK
17.0000 g | PACK | Freq: Every day | ORAL | Status: DC | PRN
  Administered 2023-01-05: 17 g via ORAL
  Filled 2023-01-04: qty 1

## 2023-01-04 MED ORDER — ALBUTEROL SULFATE (2.5 MG/3ML) 0.083% IN NEBU
15.0000 mg/h | INHALATION_SOLUTION | Freq: Once | RESPIRATORY_TRACT | Status: AC
  Administered 2023-01-04: 15 mg/h via RESPIRATORY_TRACT
  Filled 2023-01-04: qty 18

## 2023-01-04 MED ORDER — LACTATED RINGERS IV BOLUS
2000.0000 mL | Freq: Once | INTRAVENOUS | Status: AC
  Administered 2023-01-04: 2000 mL via INTRAVENOUS

## 2023-01-04 MED ORDER — LIDOCAINE 5 % EX PTCH: 2.0000 | MEDICATED_PATCH | Freq: Every day | CUTANEOUS | Status: DC | PRN

## 2023-01-04 NOTE — Sepsis Progress Note (Signed)
Elink following code sepsis °

## 2023-01-04 NOTE — ED Triage Notes (Signed)
Pt brought in by EMS from home with cough xseveral days. Pt has had ongoing issues with swallowing and has an outpatient swallow study scheduled for tomorrow. This morning pt was on the way to their PCP when EMS was called due to generalized weakness/fatigue/decreased appetite/non-productive cough. Of note patient has had intermittent fevers the last few days.   10 of albuterol and 0.5 atrovent for breathing treatments as well as 125mg  solumedrol IVP en route.

## 2023-01-04 NOTE — Progress Notes (Signed)
Pharmacy Antibiotic Note  [name] is a 82 y.o. female for which pharmacy has been consulted for ampicillin-sulbactam dosing for  aspiration pna .  Estimated Creatinine Clearance: 42.8 mL/min (by C-G formula based on SCr of 0.66 mg/dL). WBC 14.1; LA 2.6; T 97.7; HR 108; RR 13 COVID neg / flu neg  Plan: Unasyn 1.5g q6h Trend WBC, Fever, Renal function F/u cultures, clinical course, WBC De-escalate when able  Height: 4\' 8"  (142.2 cm) Weight: 68.5 kg (151 lb) IBW/kg (Calculated) : 36.3  Temp (24hrs), Avg:97.7 F (36.5 C), Min:97.7 F (36.5 C), Max:97.8 F (36.6 C)  Recent Labs  Lab 01/04/23 0920 01/04/23 0943 01/04/23 1146  WBC 14.1*  --   --   CREATININE 0.66  --   --   LATICACIDVEN  --  1.8 2.6*    Estimated Creatinine Clearance: 42.8 mL/min (by C-G formula based on SCr of 0.66 mg/dL).    Allergies  Allergen Reactions   Bactrim [Sulfamethoxazole-Trimethoprim] Cough    And flushing of face and conjunctivitis.    Microbiology results: Pending  Thank you for allowing pharmacy to be a part of this patient's care.  Lorelei Pont, PharmD, BCPS 01/04/2023 5:59 PM ED Clinical Pharmacist -  [phone]

## 2023-01-04 NOTE — ED Notes (Signed)
Pt taken off of BIPAP per request of MD McLendon, goal O2 is above 94%. Placed on 3L [place]

## 2023-01-04 NOTE — ED Provider Notes (Signed)
[facility] Provider Note   CSN: [id number] Arrival date & time: 01/04/23  [id number]     History Chief Complaint  Patient presents with   Cough    HPI [name] is a 82 y.o. female presenting for shortness of breath.  She is an 82 year old female with an extensive medical history.  Daughter states she had RSV 2 weeks ago and had been improving in the outpatient setting until 2 days ago where she started having worsening dyspnea on exertion.  Today she felt too short of breath to even eat and asked to be brought into the emergency room.  She endorses fevers cough congestion worsening dyspnea on exertion..   Patient's recorded medical, surgical, social, medication list and allergies were reviewed in the Snapshot window as part of the initial history.   Review of Systems   Review of Systems  Constitutional:  Positive for fever. Negative for chills.  HENT:  Negative for ear pain and sore throat.   Eyes:  Negative for pain and visual disturbance.  Respiratory:  Positive for cough, shortness of breath and wheezing.   Cardiovascular:  Negative for chest pain and palpitations.  Gastrointestinal:  Negative for abdominal pain and vomiting.  Genitourinary:  Negative for dysuria and hematuria.  Musculoskeletal:  Negative for arthralgias and back pain.  Skin:  Negative for color change and rash.  Neurological:  Negative for seizures and syncope.  All other systems reviewed and are negative.   Physical Exam Updated Vital Signs BP 132/79   Pulse (!) 111   Temp 97.7 F (36.5 C) (Oral)   Resp 14   Ht 4\' 8"  (1.422 m)   Wt 68.5 kg   SpO2 100%   BMI 33.85 kg/m  Physical Exam Vitals and nursing note reviewed.  Constitutional:      General: She is not in acute distress.    Appearance: She is well-developed.  HENT:     Head: Normocephalic and atraumatic.  Eyes:     Conjunctiva/sclera: Conjunctivae normal.  Cardiovascular:     Rate and Rhythm:  Regular rhythm. Tachycardia present.     Heart sounds: No murmur heard. Pulmonary:     Effort: Respiratory distress present.     Breath sounds: Rhonchi present.  Abdominal:     General: There is no distension.     Palpations: Abdomen is soft.     Tenderness: There is no abdominal tenderness. There is no right CVA tenderness or left CVA tenderness.  Musculoskeletal:        General: No swelling or tenderness. Normal range of motion.     Cervical back: Neck supple.  Skin:    General: Skin is warm and dry.  Neurological:     General: No focal deficit present.     Mental Status: She is alert and oriented to person, place, and time. Mental status is at baseline.     Cranial Nerves: No cranial nerve deficit.      ED Course/ Medical Decision Making/ A&P Clinical Course as of 01/04/23 1302  Wed Jan 04, 2023  0920 Cough and intermittent fevers for several days.  SOB today.  Barium swallow for tomorrow [CC]  1024 Now on 2 L nasal cannula.  X-ray clear per radiology the looks like diffuse opacity especially right-sided.  Will broaden workup to include CTA PE study. [CC]  1129 Code sepsis activation. Pending CTAPE [CC]    Clinical Course User Index [CC] Tretha Sciara, MD  Procedures .Critical Care  Performed by: Tretha Sciara, MD Authorized by: Tretha Sciara, MD   Critical care provider statement:    Critical care time (minutes):  90   Critical care was necessary to treat or prevent imminent or life-threatening deterioration of the following conditions:  Respiratory failure   Critical care was time spent personally by me on the following activities:  Development of treatment plan with patient or surrogate, discussions with consultants, evaluation of patient's response to treatment, examination of patient, ordering and review of laboratory studies, ordering and review of radiographic studies, ordering and performing treatments and interventions, pulse oximetry,  re-evaluation of patient's condition and review of old charts   Care discussed with: admitting provider      Medications Ordered in ED Medications  lactated ringers bolus 2,000 mL (0 mLs Intravenous Stopped 01/04/23 1148)  albuterol (PROVENTIL) (2.5 MG/3ML) 0.083% nebulizer solution (15 mg/hr Nebulization Given 01/04/23 1048)  cefTRIAXone (ROCEPHIN) 1 g in sodium chloride 0.9 % 100 mL IVPB (0 g Intravenous Stopped 01/04/23 1146)  azithromycin (ZITHROMAX) 500 mg in sodium chloride 0.9 % 250 mL IVPB (500 mg Intravenous New Bag/Given 01/04/23 1147)  iohexol (OMNIPAQUE) 350 MG/ML injection 53 mL (53 mLs Intravenous Contrast Given 01/04/23 1229)    Medical Decision Making:    [name] is a 82 y.o. female who presented to the ED today with shortness of breath detailed above.     Patient's presentation is complicated by their history of multiple comorbid medical problems.  Patient placed on continuous vitals and telemetry monitoring while in ED which was reviewed periodically.   Complete initial physical exam performed, notably the patient  was hemodynamically stable.  However she is tachycardic grossly dyspneic.  Attempted initiation of nasal cannula oxygen however patient was not tolerating continuing to be in respiratory distress.  Patient placed on BiPAP with stabilization of her respiratory symptoms   Reviewed and confirmed nursing documentation for past medical history, family history, social history.    Initial Assessment:   With the patient's presentation of shortness of breath, most likely diagnosis is postviral pneumonia. Other diagnoses were considered including (but not limited to) atypical infection, pulmonary embolism, ACS, pneumothorax, aortic dissection. These are considered less likely due to history of present illness and physical exam findings.   This is most consistent with an acute life/limb threatening illness complicated by underlying chronic conditions.  Initial Plan:  CTA PE  study to evaluate for intrathoracic abnormality Screening labs including CBC and Metabolic panel to evaluate for infectious or metabolic etiology of disease.  Urinalysis with reflex culture ordered to evaluate for UTI or relevant urologic/nephrologic pathology.  CXR to evaluate for structural/infectious intrathoracic pathology.  EKG to evaluate for cardiac pathology. Code sepsis activation including IV fluids, antibiotics Objective evaluation as below reviewed with plan for close reassessment  Initial Study Results:   Laboratory  All laboratory results reviewed without evidence of clinically relevant pathology.   Exceptions include: Rising lactic to 2.6, leukocytosis  EKG EKG was reviewed independently. Rate, rhythm, axis, intervals all examined and without medically relevant abnormality. ST segments without concerns for elevations.    Radiology  All images reviewed independently. Agree with radiology report at this time.   CT Angio Chest Pulmonary Embolism (PE) W or WO Contrast  Result Date: 01/04/2023 CLINICAL DATA:  Concern for pulmonary embolism. EXAM: CT ANGIOGRAPHY CHEST WITH CONTRAST TECHNIQUE: Multidetector CT imaging of the chest was performed using the standard protocol during bolus administration of intravenous contrast. Multiplanar CT image reconstructions  and MIPs were obtained to evaluate the vascular anatomy. RADIATION DOSE REDUCTION: This exam was performed according to the departmental dose-optimization program which includes automated exposure control, adjustment of the mA and/or kV according to patient size and/or use of iterative reconstruction technique. CONTRAST:  53 mL OMNIPAQUE IOHEXOL 350 MG/ML SOLN COMPARISON:  12/04/2022; chest radiograph-earlier same day; 12/10/2022; CT abdomen pelvis-09/03/2017; 01/18/2008 FINDINGS: Vascular Findings: There is adequate opacification of the pulmonary arterial system with the main pulmonary artery measuring 350 Hounsfield units. There  are no discrete filling defects within the pulmonary arterial tree to suggest pulmonary embolism. Enlarged caliber of the main pulmonary artery measuring 40 mm in diameter. Cardiomegaly. Calcifications involving the mitral valve annulus. Calcifications involving the aortic leaflets (image 61). Scattered atherosclerotic plaque within a normal caliber thoracic aorta. 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 The descending thoracic aorta is tortuous but of normal caliber. Review of the MIP images confirms the above findings. 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 Scattered mediastinal lymph nodes are numerous and mildly prominent though individually not enlarged by size criteria with index retrotracheal lymph node measuring 0.9 cm in diameter (image 20, series 5, index precarinal lymph node measuring 0.8 cm (image 35, series 5, presumably reactive in etiology. No definitive hilar or axillary lymphadenopathy. Lungs/Pleura: Interval development of consolidative airspace opacities with associated air bronchograms within the right middle lobe (representative image 61, series 7). Ill-defined ground-glass opacities are seen involving the right lung apex (image 31, series 7). Ill-defined heterogeneous airspace opacities are also seen within the medial basilar aspect of the bilateral lower lobes, right slightly greater than left. There is mild diffuse bronchial wall thickening however the central pulmonary airways appear patent. No pleural effusion or pneumothorax. Upper abdomen: Limited early arterial phase evaluation of the upper abdomen demonstrates nodularity of the hepatic contour suggestive of cirrhosis. Note is  made of an approximately 1.2 cm left-sided adrenal nodule (image 85, series 5, with a morphologically unchanged since remote abdominal CT performed in 2009 and thus compatible with a benign adrenal adenoma. Punctate splenule is noted at the level of the splenic hilum. Musculoskeletal: Interval development of diffuse mixed lytic appearance of the imaged osseous structures, most conspicuously involving the sternum and manubrium. Interval development of a nondisplaced fracture involving the right-side of the manubrium (sagittal image 86, series 9; axial image 30, series 5)), new compared to recent chest CT performed 12/04/2022. Old severe (greater 75%) compression deformity of the T12 vertebral body, similar since at least the 2018 abdominal CT. Regional soft tissues appear normal. Normal appearance of the thyroid gland. IMPRESSION: 1. No evidence of pulmonary embolism. 2. Interval development of scattered bilateral airspace opacities, most conspicuous within the right middle lobe, nonspecific though worrisome for multifocal infection, including atypical etiologies. 3. Interval development of diffuse mixed lytic appearance of the imaged osseous structures, most conspicuously involving the sternum and manubrium, with associated nondisplaced fracture of the right-side of the manubrium, new compared to recent chest CT performed 12/04/2022. Findings are nonspecific though could be seen in the setting of multiple myeloma. Clinical correlation is advised. Further evaluation with SPEP/UPEP and skeletal survey could be performed as indicated. 4. Cardiomegaly with calcifications involving the aortic leaflets and enlargement of the caliber of the main pulmonary artery, nonspecific though could be seen in the setting of pulmonary arterial hypertension. Further  evaluation with cardiac echo could be performed as indicated. 5. Incidentally noted aberrant right subclavian artery. 6. Nodularity of the hepatic contour suggestive of  cirrhosis. Correlation with LFTs is advised. 7. Aortic Atherosclerosis (ICD10-I70.0). Electronically Signed   By: Sandi Mariscal M.D.   On: 01/04/2023 12:51   DG Chest Portable 1 View  Result Date: 01/04/2023 CLINICAL DATA:  82 year old female with cough and shortness of breath. Issues with swallowing, outpatient swallow study planned for tomorrow. EXAM: PORTABLE CHEST 1 VIEW COMPARISON:  Portable chest 12/10/2022 and earlier. FINDINGS: Portable AP semi upright view at 0930 hours. Stable lung volumes and mediastinal contours. Mild cardiomegaly and tortuous thoracic aorta. Visualized tracheal air column is within normal limits. Allowing for portable technique ventilation is stable since last month with no pneumothorax, pleural effusion, edema, or confluent opacity. Negative visible bowel gas pattern. No acute osseous abnormality identified. IMPRESSION: No acute cardiopulmonary abnormality. Electronically Signed   By: Genevie Ann M.D.   On: 01/04/2023 09:44     Final Assessment and Plan:   Objective evaluation grossly concerning for sepsis.  Code sepsis was activated and patient was broadly treated with ceftriaxone and azithromycin.  She required titration onto BiPAP due to respiratory distress.  CTA reveals right lobar pneumonia.  Will admit for further care and management.   Disposition:   Based on the above findings, I believe this patient is stable for admission.    Patient/family educated about specific findings on our evaluation and explained exact reasons for admission.  Patient/family educated about clinical situation and time was allowed to answer questions.   Admission team communicated with and agreed with need for admission. Patient admitted. Patient ready to move at this time.     Emergency Department Medication Summary:   Medications  lactated ringers bolus 2,000 mL (0 mLs Intravenous Stopped 01/04/23 1148)  albuterol (PROVENTIL) (2.5 MG/3ML) 0.083% nebulizer solution (15 mg/hr Nebulization  Given 01/04/23 1048)  cefTRIAXone (ROCEPHIN) 1 g in sodium chloride 0.9 % 100 mL IVPB (0 g Intravenous Stopped 01/04/23 1146)  azithromycin (ZITHROMAX) 500 mg in sodium chloride 0.9 % 250 mL IVPB (500 mg Intravenous New Bag/Given 01/04/23 1147)  iohexol (OMNIPAQUE) 350 MG/ML injection 53 mL (53 mLs Intravenous Contrast Given 01/04/23 1229)         Clinical Impression: No diagnosis found.   Data Unavailable   Final Clinical Impression(s) / ED Diagnoses Final diagnoses:  None    Rx / DC Orders ED Discharge Orders     None         Tretha Sciara, MD 01/04/23 1517

## 2023-01-04 NOTE — Progress Notes (Signed)
Pt transported to and from CT without event.  

## 2023-01-04 NOTE — Hospital Course (Addendum)
Principal Problem:   Acute hypoxic respiratory failure (HCC) Active Problems:   Diabetes ([place])   Atrial fibrillation with RVR (HCC)   Macrocytic anemia  Resolved Problems:   Sepsis ([place])   Pneumonia  Consults: - Pulmonology   Procedures:***  Follow-up items: - macrocytic anemia - cirrhosis - OSA - PFTs? 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  She was tachycardic with leukocytosis and intermittently desaturating on room air.  Admitted for sepsis and respiratory failure due to multifocal pneumonia on imaging.  Daughter reported concern for aspiration so she was treated with Unasyn and azithromycin.  Sepsis quickly resolved.  She was noted to have significant wheezing and was treated with intermittent IV methylprednisolone.  Despite completing a course of antibiotics and resolving signs of infection, the patient remained dependent on supplemental oxygen.  Pulmonology consulted out of concern for subacute to chronic lung disease.  Steroid taper was started for organizing pneumonia versus ILD.  Discharged with supplemental oxygen and ***rehab.  Chronic A-fib RVR Tachycardic on admission.  Improved somewhat but heart rate remained stable between 90s and 120s.  Metoprolol was increased after several days in the hospital to good effect.  Apixaban decreased from 5 twice daily to 2.5 twice daily based on patient age and weight.  Dysphagia Functional decline Family reports steady decline in functional status over the past 3 to 6 months.  This time.  Has been marked by a severe bout of RSV pneumonia  requiring hospitalization in January 2024 and a couple of falls without severe injury.  Family notes some weight loss and less appetite.  They also report periods of coughing and choking after eating or drinking.  This person was being evaluated in the outpatient setting for dysphagia.  Modified barium swallow and esophagram were essentially within normal limits for this patient's age.  She was ordered a dysphagia diet with thin liquids and started on daily proton pump inhibitor for empiric treatment of GERD and functional dysphagia.  Macrocytic anemia Macrocytosis noted as early as January 2022.  Anemia intermittent since 2019 with marked worsening during this hospitalization.  B12 and folate levels tested have been normal.  Notably this person's chest CT from this admission showed some lytic appearing lesions and nondisplaced manubrial fracture, thought to be secondary to prior fall where she injured her chest.  However given the concomitant macrocytic anemia, multiple myeloma workup was pursued which was normal.  She was also iron replete.  Wonder about a smoldering myelodysplastic process.  3/29 -still some tongue burning at tip -eating breakfast -HR 90s -ambulate w/ assistance to sink/door then back to bed yesterday  -need help with handicap parking request/form***

## 2023-01-04 NOTE — ED Notes (Signed)
#  [id number] - interpreter used for triage and assesssment

## 2023-01-04 NOTE — H&P (Cosign Needed Addendum)
Date: 01/04/2023         Patient Name:  [name] MRN: [id number]  DOB: [birth year] Age / Sex: 82 y.o., female   PCP: Haydee Salter, MD         Medical Service: Internal Medicine Teaching Service         Attending Physician: Dr. Lucious Groves, DO    First Contact: Dr. Carin Primrose Pager: [phone]  Second Contact: Dr. Allyson Sabal Pager: [phone]       After Hours (After 5p/  First Contact Pager: [phone]  weekends / holidays): Second Contact Pager: [phone]   Chief Concern: Breathing difficulty  History of Present Illness: 82 year old [place] speaking person accompanied by her daughter who is a Software engineer, who provides the history.  On Friday, this person began to have breathing difficulties with cough.  A wracking cough is described that has been keeping them awake much of the night.  Overnight or later on Saturday, had a fever to 103 F.  There were treated with Tylenol to good effect and seemed to feel better before worsening again through last night.  Last night, this person's daughter reports that they were up all night coughing.  Additionally she seemed to be talking to people that were not there, although daughter acknowledges that this person had been dreaming.  The cough sounds wet now whereas before it sounded dry, but they do not seem to bring any phlegm up.  Pertinent history includes severe case of RSV leading to hospitalization for acute hypoxic respiratory failure in January 2024.  The patient's overall functional status has seemed to be on the decline since.  She has suffered 2 falls requiring ED visits.  Her appetite is not what it was before her hospitalization.  She also suffered a stroke a few years ago that resulted in significant functional decline including difficulty swallowing.  It is reported that this person has been referred for a swallowing study.  The daughter expresses some concern for aspiration as even before this acute illness, she seems to be coughing more  especially with meals.  She received steroids by EMS prior to arrival.  On arrival to the ED she is tachycardic with intermittent desaturations to high 80s on room air.  She has leukocytosis.  BiPAP started.  Blood cultures drawn.  2 L LR administered.  Ceftriaxone and azithromycin administered.  Called for admission for presumed sepsis due to pneumonia.  Review of Systems  Constitutional:  Positive for fever and malaise/fatigue.  Respiratory:  Positive for cough, shortness of breath and wheezing. Negative for sputum production.   Cardiovascular:  Negative for chest pain.  Gastrointestinal:  Positive for constipation. Negative for abdominal pain, nausea and vomiting.   Medications: Reviewed, pertinent updates include: Metoprolol 25 mg twice daily Metformin 500 mg twice daily Has not used nitroglycerin in a long time Taking some Tussionex for cough Forteo for osteoporosis Was on steroids and inhalers after RSV, but no longer  Allergies: Allergies  Allergen Reactions   Bactrim [Sulfamethoxazole-Trimethoprim] Cough    And flushing of face and conjunctivitis.    Past Medical History: Reviewed, pertinent updates include: Hospitalization for RSV in January 2024 2 falls requiring visits to the ED, 1 with injury to chest Functional decline over the last 3 to 6 months Diabetes Atrial fibrillation OSA, intolerant of positive pressure ventilation Acute cerebral infarct, 2017  Surgical History: Reviewed, no pertinent updates.  Family History:  Reviewed, no pertinent updates.  Social History:  Lives with daughter.  Moves  around the house with assistance.  Functional decline last 3 to 6 months.  Non-smoker, but exposed to secondhand smoke.  Physical Exam: Blood pressure 107/65, pulse (!) 114, temperature 97.8 F (36.6 C), temperature source Oral, resp. rate 15, height 4\' 8"  (1.422 m), weight 61 kg, SpO2 95 %.  Somnolent appearing but easily aroused Oral mucous membranes are  moist Heart rate is tachycardic, rhythm is regular, radial pulses are strong, no appreciable JVD Breathing is assisted with BiPAP, diffuse wheezing and coarse crackles throughout all lung fields Abdomen soft and nontender Skin is warm and dry Alert to verbal, oriented to person place and event, somnolent  EKG:  Atrial fibrillation with rapid ventricular response  Labs: ABG 7.331/45.1/95/25  Sodium 141 Potassium 3.2 Bicarbonate 24 Glucose 152 Alkaline phosphatase 62 Albumin 3.5 AST 18 ALT 9 Lactic acid 1.8 => 2.6  WBC 14.1 Hemoglobin 8.8 MCV 106.2  RSV, flu A/B, COVID-negative  Images and other studies: CT chest without PE, bilateral airspace opacities most conspicuous on the right middle lobe, lytic appearance of sternum and manubrium, cardiomegaly, nodular liver, aortic atherosclerosis.  Assessment & Plan:  [name] is a 82 y.o. with a history of recent functional decline presents with acute hypoxic respiratory failure with wheezing and crackles, imaging with predominantly right middle lobe opacities concerning for pneumonia with sepsis.  Principal Problem:   Acute hypoxic respiratory failure (HCC) Active Problems:   Diabetes ([place])   Atrial fibrillation with RVR (HCC)   Sepsis ([place])   Pneumonia  Sepsis Multifocal pneumonia, predominantly right middle lobe Acute hypoxic respiratory failure History of fever and worsening cough with the opacities on CT chest suggestive of pneumonia.  In setting of recent functional decline and concern for aspiration, will start coverage for CAP plus anaerobes.  I do not think this patient is at risk for MRSA or Pseudomonas.  Will treat with IV antibiotics and fluid resuscitation given sepsis with tachycardia and leukocytosis.  At time of interview, patient was oxygenating well off BiPAP for several minutes.  ABG without significant CO2 retention, I think we can de-escalate to nasal cannula now.  Significant component of wheezing makes me  concerned for underlying reactive airway disease or viral bronchiolitis.  Will continue treatment with nebulizers and send respiratory viral panel.  PE ruled out by imaging.  Appears euvolemic, do not suspect heart failure. - IV Unasyn - DuoNebs every 2 hours as needed for wheezing and shortness of breath - Guaifenesin for cough - O2 sats >94% - Follow blood cultures - Admit to progressive unit  Atrial fibrillation RVR In setting of acute hypoxic respiratory failure pneumonia.  On metoprolol 25 mg twice daily.  Will hold this medicine for now given concern for underlying reactive airway disease and bronchospasm. - Hold metoprolol for now - Continue apixaban 5 mg twice daily  Concern for aspiration Recent functional decline Swallow has been impaired since stroke in 2017.  Had outpatient study scheduled for 01/05/2023.  Probably elevated aspiration risk.  Will ask SLP to see and consider swallow study while inpatient. - N.p.o. for now - Appreciate SLP assistance with this case - PT/OT  Incidental findings on CT chest Include lesions with suspicious lytic appearance and sternum and manubrium in addition to nondisplaced fracture of right-sided manubrium.  Question multiple myeloma versus old injuries from recent falls.  Also with nodular liver suggestive of cirrhosis.  Does not have obvious stigmata of cirrhosis on exam. - Will add these issues to discharge summary for follow-up  Diabetes Last  hemoglobin A1c 6.9.  On metformin outpatient. - SSI moderate  Diet: N.p.o. IVF: Status post 3 L LR VTE: Therapeutic apixaban Code: Full Surrogate: Lieu,Nicolasa (Daughter) [phone] ([place])   Admit patient to Inpatient with expected length of stay greater than 2 midnights.  Signed: [name] MD 01/04/2023, 8:06 PM  Pager: [phone] After 5pm on weekdays and 1pm on weekends: [phone]

## 2023-01-04 NOTE — Sepsis Progress Note (Signed)
Notified provider of need to order repeat lactic acid. ° °

## 2023-01-04 NOTE — ED Notes (Signed)
ED TO INPATIENT HANDOFF REPORT  ED Nurse Name and Phone #: Ivin Poot Name/Age/Gender [name] 82 y.o. female Room/Bed: 032C/032C  Code Status   Code Status: Full Code  Home/SNF/Other Home Patient oriented to: self, place, time, and situation Is this baseline? Yes   Triage Complete: Triage complete  Chief Complaint Acute hypoxic respiratory failure ([place]) [J96.01]  Triage Note Pt brought in by EMS from home with cough xseveral days. Pt has had ongoing issues with swallowing and has an outpatient swallow study scheduled for tomorrow. This morning pt was on the way to their PCP when EMS was called due to generalized weakness/fatigue/decreased appetite/non-productive cough. Of note patient has had intermittent fevers the last few days.   10 of albuterol and 0.5 atrovent for breathing treatments as well as 125mg  solumedrol IVP en route.    Allergies Allergies  Allergen Reactions   Bactrim [Sulfamethoxazole-Trimethoprim] Cough    And flushing of face and conjunctivitis.     Level of Care/Admitting Diagnosis ED Disposition     ED Disposition  Admit   Condition  --   [place]: [place] [[id number]]  Level of Care: Progressive [102]  Admit to Progressive based on following criteria: RESPIRATORY PROBLEMS hypoxemic/hypercapnic respiratory failure that is responsive to NIPPV (BiPAP) or High Flow Nasal Cannula (6-80 lpm). Frequent assessment/intervention, no > Q2 hrs < Q4 hrs, to maintain oxygenation and pulmonary hygiene.  May admit patient to [facility] or [facility] if equivalent level of care is available:: No  Covid Evaluation: Confirmed COVID Negative  Diagnosis: Acute hypoxic respiratory failure [facility]) [id number]  Admitting Physician: Lucious Groves [[id number]]  Attending Physician: Lucious Groves [id number]  Certification:: I certify this patient will need inpatient services for at least 2 midnights  Estimated Length of Stay: 2           B Medical/Surgery History Past Medical History:  Diagnosis Date   Atrial fibrillation ([place])    Chronic back pain    "mid back down into lower back" (08/25/2014)   Diabetes mellitus without complication (HCC)    Frequent falls    GERD (gastroesophageal reflux disease)    Hypertriglyceridemia    OSA on CPAP    Osteoarthritis    "knees, hands, back" (08/25/2014)   Pneumonia ~ 2000 X 1   Subdural hematoma ([place]) july 2015   S/P fall while on Coumadin   T12 compression fracture ([place]) 2012   Type II diabetes mellitus ([place])    Past Surgical History:  Procedure Laterality Date   APPENDECTOMY  2012   CATARACT EXTRACTION W/ INTRAOCULAR LENS  IMPLANT, BILATERAL Bilateral 2000's   IR GENERIC HISTORICAL  10/15/2016   IR PERCUTANEOUS ART THROMBECTOMY/INFUSION INTRACRANIAL INC DIAG ANGIO 10/15/2016 [name], MD [facility] RAD   IR GENERIC HISTORICAL  12/13/2016   IR RADIOLOGIST EVAL & MGMT 12/13/2016 [facility]   RADIOLOGY WITH ANESTHESIA N/A 10/15/2016   Procedure: RADIOLOGY WITH ANESTHESIA;  Surgeon: [name], MD;  Location: [facility];  Service: Radiology;  Laterality: N/A;   TOTAL ABDOMINAL HYSTERECTOMY  1990     A IV Location/Drains/Wounds Patient Lines/Drains/Airways Status     Active Line/Drains/Airways     Name Placement date Placement time Site Days   Peripheral IV 01/04/23 20 G Posterior;Right Hand 01/04/23  --  Hand  less than 1   Peripheral IV 01/04/23 20 G Right Antecubital 01/04/23  0943  Antecubital  less than 1  Intake/Output Last 24 hours  Intake/Output Summary (Last 24 hours) at 01/04/2023 1751 Last data filed at 01/04/2023 1305 Gross per 24 hour  Intake 2250 ml  Output --  Net 2250 ml    Labs/Imaging Results for orders placed or performed during the hospital encounter of 01/04/23 (from the past 48 hour(s))  CBC with Differential     Status: Abnormal   Collection Time: 01/04/23  9:20 AM  Result Value Ref Range   WBC 14.1 (H)  4.0 - 10.5 K/uL   RBC 2.91 (L) 3.87 - 5.11 MIL/uL   Hemoglobin 9.8 (L) 12.0 - 15.0 g/dL   HCT 30.9 (L) 36.0 - 46.0 %   MCV 106.2 (H) 80.0 - 100.0 fL   MCH 33.7 26.0 - 34.0 pg   MCHC 31.7 30.0 - 36.0 g/dL   RDW 14.0 11.5 - 15.5 %   Platelets 409 (H) 150 - 400 K/uL   nRBC 0.1 0.0 - 0.2 %   Neutrophils Relative % 35 %   Neutro Abs 4.9 1.7 - 7.7 K/uL   Lymphocytes Relative 49 %   Lymphs Abs 6.9 (H) 0.7 - 4.0 K/uL   Monocytes Relative 13 %   Monocytes Absolute 1.8 (H) 0.1 - 1.0 K/uL   Eosinophils Relative 1 %   Eosinophils Absolute 0.1 0.0 - 0.5 K/uL   Basophils Relative 1 %   Basophils Absolute 0.1 0.0 - 0.1 K/uL   Immature Granulocytes 1 %   Abs Immature Granulocytes 0.20 (H) 0.00 - 0.07 K/uL    Comment: Performed at [facility], [address] [place], [place] [address]  Comprehensive metabolic panel     Status: Abnormal   Collection Time: 01/04/23  9:20 AM  Result Value Ref Range   Sodium 137 135 - 145 mmol/L   Potassium 3.7 3.5 - 5.1 mmol/L   Chloride 102 98 - 111 mmol/L   CO2 24 22 - 32 mmol/L   Glucose, Bld 152 (H) 70 - 99 mg/dL    Comment: Glucose reference range applies only to samples taken after fasting for at least 8 hours.   BUN 10 8 - 23 mg/dL   Creatinine, Ser 0.66 0.44 - 1.00 mg/dL   Calcium 8.8 (L) 8.9 - 10.3 mg/dL   Total Protein 6.4 (L) 6.5 - 8.1 g/dL   Albumin 3.5 3.5 - 5.0 g/dL   AST 18 15 - 41 U/L   ALT 9 0 - 44 U/L   Alkaline Phosphatase 62 38 - 126 U/L   Total Bilirubin 1.0 0.3 - 1.2 mg/dL   GFR, Estimated >60 >60 mL/min    Comment: (NOTE) Calculated using the CKD-EPI Creatinine Equation (2021)    Anion gap 11 5 - 15    Comment: Performed at [facility], [place] [address], [place], [place] [address]  Resp panel by RT-PCR (RSV, Flu A&B, Covid) Anterior Nasal Swab     Status: None   Collection Time: 01/04/23  9:20 AM   Specimen: Anterior Nasal Swab  Result Value Ref Range   SARS Coronavirus 2 by RT PCR NEGATIVE NEGATIVE   Influenza A by  PCR NEGATIVE NEGATIVE   Influenza B by PCR NEGATIVE NEGATIVE    Comment: (NOTE) The Xpert Xpress SARS-CoV-2/FLU/RSV plus assay is intended as an aid in the diagnosis of influenza from Nasopharyngeal swab specimens and should not be used as a sole basis for treatment. Nasal washings and aspirates are unacceptable for Xpert Xpress SARS-CoV-2/FLU/RSV testing.  Fact Sheet for Patients:  EntrepreneurPulse.com.au  Fact Sheet for Healthcare Providers: IncredibleEmployment.be  This test is not yet approved or cleared by the [place] FDA and has been authorized for detection and/or diagnosis of SARS-CoV-2 by FDA under an Emergency Use Authorization (EUA). This EUA will remain in effect (meaning this test can be used) for the duration of the COVID-19 declaration under Section 564(b)(1) of the Act, 21 U.S.C. section 360bbb-3(b)(1), unless the authorization is terminated or revoked.     Resp Syncytial Virus by PCR NEGATIVE NEGATIVE    Comment: (NOTE) Fact Sheet for Patients: EntrepreneurPulse.com.au  Fact Sheet for Healthcare Providers: IncredibleEmployment.be  This test is not yet approved or cleared by the [place] FDA and has been authorized for detection and/or diagnosis of SARS-CoV-2 by FDA under an Emergency Use Authorization (EUA). This EUA will remain in effect (meaning this test can be used) for the duration of the COVID-19 declaration under Section 564(b)(1) of the Act, 21 U.S.C. section 360bbb-3(b)(1), unless the authorization is terminated or revoked.  Performed at [facility], [place] [address], [place], [place] [address]   Lipase, blood     Status: None   Collection Time: 01/04/23  9:20 AM  Result Value Ref Range   Lipase 27 11 - 51 U/L    Comment: Performed at [facility] [address], [place], [place] [address]  Lactic acid, plasma     Status: None   Collection Time:  01/04/23  9:43 AM  Result Value Ref Range   Lactic Acid, Venous 1.8 0.5 - 1.9 mmol/L    Comment: Performed at [facility] [address], [place], [place] [address]  Lactic acid, plasma     Status: Abnormal   Collection Time: 01/04/23 11:46 AM  Result Value Ref Range   Lactic Acid, Venous 2.6 (HH) 0.5 - 1.9 mmol/L    Comment: CRITICAL RESULT CALLED TO, READ BACK BY AND VERIFIED WITH Octavia Heir, RN AT [id number] 03.20.24 D. BLU Performed at [facility] [address] [place], [place] [address]   I-Stat arterial blood gas, ED     Status: Abnormal   Collection Time: 01/04/23  5:24 PM  Result Value Ref Range   pH, Arterial 7.331 (L) 7.35 - 7.45   pCO2 arterial 45.1 32 - 48 mmHg   pO2, Arterial 95 83 - 108 mmHg   Bicarbonate 24.0 20.0 - 28.0 mmol/L   TCO2 25 22 - 32 mmol/L   O2 Saturation 97 %   Acid-base deficit 2.0 0.0 - 2.0 mmol/L   Sodium 141 135 - 145 mmol/L   Potassium 3.2 (L) 3.5 - 5.1 mmol/L   Calcium, Ion 1.23 1.15 - 1.40 mmol/L   HCT 26.0 (L) 36.0 - 46.0 %   Hemoglobin 8.8 (L) 12.0 - 15.0 g/dL   Patient temperature 97.7 F    Collection site art line    Drawn by RT    Sample type ARTERIAL    CT Angio Chest Pulmonary Embolism (PE) W or WO Contrast  Result Date: 01/04/2023 CLINICAL DATA:  Concern for pulmonary embolism. EXAM: CT ANGIOGRAPHY CHEST WITH CONTRAST TECHNIQUE: Multidetector CT imaging of the chest was performed using the standard protocol during bolus administration of intravenous contrast. Multiplanar CT image reconstructions and MIPs were obtained to evaluate the vascular anatomy. RADIATION DOSE REDUCTION: This exam was performed according to the departmental dose-optimization program which includes automated exposure control, adjustment of the mA and/or kV according to patient size and/or use of iterative  reconstruction technique. CONTRAST:  53 mL OMNIPAQUE IOHEXOL 350 MG/ML SOLN COMPARISON:  12/04/2022; chest radiograph-earlier same day; 12/10/2022; CT  abdomen pelvis-09/03/2017; 01/18/2008 FINDINGS: Vascular Findings: There is adequate opacification of the pulmonary arterial system with the main pulmonary artery measuring 350 Hounsfield units. There are no discrete filling defects within the pulmonary arterial tree to suggest pulmonary embolism. Enlarged caliber of the main pulmonary artery measuring 40 mm in diameter. Cardiomegaly. Calcifications involving the mitral valve annulus. Calcifications involving the aortic leaflets (image 61). Scattered atherosclerotic plaque within a normal caliber thoracic aorta. 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 The descending thoracic aorta is tortuous but of normal caliber. Review of the MIP images confirms the above findings. 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 Scattered mediastinal lymph nodes are numerous and mildly prominent though individually not enlarged by size criteria with index retrotracheal lymph node measuring 0.9 cm in diameter (image 20, series 5, index precarinal lymph node measuring 0.8 cm (image 35, series 5, presumably reactive in etiology. No definitive hilar or axillary lymphadenopathy. Lungs/Pleura: Interval development of consolidative airspace opacities with associated air bronchograms within the right middle lobe (representative image 61, series 7). Ill-defined ground-glass opacities are seen involving the right lung apex (image 31, series 7). Ill-defined heterogeneous airspace opacities are also seen within the medial basilar aspect of the bilateral lower lobes, right slightly greater than left. There is mild diffuse bronchial wall thickening however the central pulmonary airways appear  patent. No pleural effusion or pneumothorax. Upper abdomen: Limited early arterial phase evaluation of the upper abdomen demonstrates nodularity of the hepatic contour suggestive of cirrhosis. Note is made of an approximately 1.2 cm left-sided adrenal nodule (image 85, series 5, with a morphologically unchanged since remote abdominal CT performed in 2009 and thus compatible with a benign adrenal adenoma. Punctate splenule is noted at the level of the splenic hilum. Musculoskeletal: Interval development of diffuse mixed lytic appearance of the imaged osseous structures, most conspicuously involving the sternum and manubrium. Interval development of a nondisplaced fracture involving the right-side of the manubrium (sagittal image 86, series 9; axial image 30, series 5)), new compared to recent chest CT performed 12/04/2022. Old severe (greater 75%) compression deformity of the T12 vertebral body, similar since at least the 2018 abdominal CT. Regional soft tissues appear normal. Normal appearance of the thyroid gland. IMPRESSION: 1. No evidence of pulmonary embolism. 2. Interval development of scattered bilateral airspace opacities, most conspicuous within the right middle lobe, nonspecific though worrisome for multifocal infection, including atypical etiologies. 3. Interval development of diffuse mixed lytic appearance of the imaged osseous structures, most conspicuously involving the sternum and manubrium, with associated nondisplaced fracture of the right-side of the manubrium, new compared to recent chest CT performed 12/04/2022. Findings are nonspecific though could be seen in the setting of multiple myeloma. Clinical correlation is advised. Further evaluation with SPEP/UPEP and skeletal survey could be performed as indicated. 4. Cardiomegaly with calcifications involving the aortic leaflets and enlargement of the caliber of the main pulmonary artery, nonspecific though could be seen in the setting of pulmonary  arterial hypertension. Further evaluation with cardiac echo could be performed as indicated. 5. Incidentally noted aberrant right subclavian artery. 6. Nodularity of the hepatic contour suggestive of cirrhosis. Correlation with LFTs is advised. 7. Aortic Atherosclerosis (ICD10-I70.0). Electronically Signed   By: Jenny Reichmann  Watts M.D.   On: 01/04/2023 12:51   DG Chest Portable 1 View  Result Date: 01/04/2023 CLINICAL DATA:  82 year old female with cough and shortness of breath. Issues with swallowing, outpatient swallow study planned for tomorrow. EXAM: PORTABLE CHEST 1 VIEW COMPARISON:  Portable chest 12/10/2022 and earlier. FINDINGS: Portable AP semi upright view at 0930 hours. Stable lung volumes and mediastinal contours. Mild cardiomegaly and tortuous thoracic aorta. Visualized tracheal air column is within normal limits. Allowing for portable technique ventilation is stable since last month with no pneumothorax, pleural effusion, edema, or confluent opacity. Negative visible bowel gas pattern. No acute osseous abnormality identified. IMPRESSION: No acute cardiopulmonary abnormality. Electronically Signed   By: Genevie Ann M.D.   On: 01/04/2023 09:44    Pending Labs Unresulted Labs (From admission, onward)     Start     Ordered   01/05/23 [id number]  Basic metabolic panel  Tomorrow morning,   R        01/04/23 1741   01/05/23 0500  CBC  Tomorrow morning,   R        01/04/23 1741   01/04/23 1746  Magnesium  Add-on,   AD        01/04/23 1745   01/04/23 1634  Lactic acid, plasma  STAT Now then every 3 hours,   R (with STAT occurrences)      01/04/23 1633   01/04/23 0934  Blood culture (routine x 2)  BLOOD CULTURE X 2,   R (with STAT occurrences)      01/04/23 0933            Vitals/Pain Today's Vitals   01/04/23 1431 01/04/23 1500 01/04/23 1517 01/04/23 1654  BP: 111/67 109/60 109/60   Pulse: (!) 108 (!) 110 (!) 108   Resp: 13 14 13    Temp:    97.7 F (36.5 C)  TempSrc:    Oral  SpO2: 100% 99%  100%   Weight:      Height:      PainSc:        Isolation Precautions No active isolations  Medications Medications  ipratropium-albuterol (DUONEB) 0.5-2.5 (3) MG/3ML nebulizer solution 3 mL (3 mLs Nebulization Given 01/04/23 1711)  senna (SENOKOT) tablet 8.6 mg (has no administration in time range)  polyethylene glycol (MIRALAX / GLYCOLAX) packet 17 g (has no administration in time range)  potassium chloride 10 mEq in 100 mL IVPB (has no administration in time range)  lactated ringers bolus 2,000 mL (0 mLs Intravenous Stopped 01/04/23 1148)  albuterol (PROVENTIL) (2.5 MG/3ML) 0.083% nebulizer solution (15 mg/hr Nebulization Given 01/04/23 1048)  cefTRIAXone (ROCEPHIN) 1 g in sodium chloride 0.9 % 100 mL IVPB (0 g Intravenous Stopped 01/04/23 1146)  azithromycin (ZITHROMAX) 500 mg in sodium chloride 0.9 % 250 mL IVPB (0 mg Intravenous Stopped 01/04/23 1305)  iohexol (OMNIPAQUE) 350 MG/ML injection 53 mL (53 mLs Intravenous Contrast Given 01/04/23 1229)  lactated ringers bolus 1,000 mL (1,000 mLs Intravenous New Bag/Given 01/04/23 1710)    Mobility walks with device     Focused Assessments Pulmonary Assessment Handoff:  Lung sounds: Bilateral Breath Sounds: Expiratory wheezes, Rhonchi L Breath Sounds: Expiratory wheezes R Breath Sounds: Expiratory wheezes O2 Device: Bi-PAP O2 Flow Rate (L/min): 2 L/min    R Recommendations: See Admitting Provider Note  Report given to:   Additional Notes: pt has had multiple falls over the last several months, uses walker at home. Daughter, Kennon Rounds, does a lot of translating but [place] on the interpreter  works too. Currently taking her off of BIPAP right now.  2

## 2023-01-05 ENCOUNTER — Inpatient Hospital Stay (HOSPITAL_COMMUNITY): Payer: Medicare Other

## 2023-01-05 ENCOUNTER — Inpatient Hospital Stay (HOSPITAL_COMMUNITY): Admission: RE | Admit: 2023-01-05 | Payer: Medicare Other | Source: Ambulatory Visit

## 2023-01-05 ENCOUNTER — Encounter (HOSPITAL_COMMUNITY): Payer: Self-pay

## 2023-01-05 DIAGNOSIS — J9601 Acute respiratory failure with hypoxia: Secondary | ICD-10-CM

## 2023-01-05 DIAGNOSIS — A419 Sepsis, unspecified organism: Secondary | ICD-10-CM

## 2023-01-05 DIAGNOSIS — J189 Pneumonia, unspecified organism: Secondary | ICD-10-CM

## 2023-01-05 LAB — BASIC METABOLIC PANEL
Anion gap: 9 (ref 5–15)
BUN: 7 mg/dL — ABNORMAL LOW (ref 8–23)
CO2: 24 mmol/L (ref 22–32)
Calcium: 8.1 mg/dL — ABNORMAL LOW (ref 8.9–10.3)
Chloride: 106 mmol/L (ref 98–111)
Creatinine, Ser: 0.56 mg/dL (ref 0.44–1.00)
GFR, Estimated: >60 mL/min (ref >60)
Glucose, Bld: 156 mg/dL — ABNORMAL HIGH (ref 70–99)
Potassium: 4.1 mmol/L (ref 3.5–5.1)
Sodium: 139 mmol/L (ref 135–145)

## 2023-01-05 LAB — CBC
HCT: 27.4 % — ABNORMAL LOW (ref 36.0–46.0)
Hemoglobin: 8.6 g/dL — ABNORMAL LOW (ref 12.0–15.0)
MCH: 33.3 pg (ref 26.0–34.0)
MCHC: 31.4 g/dL (ref 30.0–36.0)
MCV: 106.2 fL — ABNORMAL HIGH (ref 80.0–100.0)
Platelets: 401 10*3/uL — ABNORMAL HIGH (ref 150–400)
RBC: 2.58 MIL/uL — ABNORMAL LOW (ref 3.87–5.11)
RDW: 14.2 % (ref 11.5–15.5)
WBC: 10 10*3/uL (ref 4.0–10.5)
nRBC: 0.2 % (ref 0.0–0.2)

## 2023-01-05 LAB — FOLATE: Folate: 12.6 ng/mL (ref >5.9)

## 2023-01-05 LAB — GLUCOSE, CAPILLARY
Glucose-Capillary: 132 mg/dL — ABNORMAL HIGH (ref 70–99)
Glucose-Capillary: 139 mg/dL — ABNORMAL HIGH (ref 70–99)

## 2023-01-05 MED ORDER — IPRATROPIUM-ALBUTEROL 0.5-2.5 (3) MG/3ML IN SOLN
3.0000 mL | Freq: Three times a day (TID) | RESPIRATORY_TRACT | Status: DC
  Administered 2023-01-05 – 2023-01-08 (×7): 3 mL via RESPIRATORY_TRACT
  Filled 2023-01-05 (×8): qty 3

## 2023-01-05 MED ORDER — METOPROLOL TARTRATE 25 MG PO TABS
25.0000 mg | ORAL_TABLET | Freq: Two times a day (BID) | ORAL | Status: DC
  Administered 2023-01-05 – 2023-01-11 (×14): 25 mg via ORAL
  Filled 2023-01-05 (×5): qty 1
  Filled 2023-01-05: qty 2
  Filled 2023-01-05 (×8): qty 1

## 2023-01-05 MED ORDER — GUAIFENESIN ER 600 MG PO TB12
600.0000 mg | ORAL_TABLET | Freq: Two times a day (BID) | ORAL | Status: DC
  Administered 2023-01-05 – 2023-01-14 (×19): 600 mg via ORAL
  Filled 2023-01-05 (×19): qty 1

## 2023-01-05 MED ORDER — HYDROCOD POLI-CHLORPHE POLI ER 10-8 MG/5ML PO SUER
5.0000 mL | Freq: Every evening | ORAL | Status: DC | PRN
  Administered 2023-01-05 – 2023-01-13 (×6): 5 mL via ORAL
  Filled 2023-01-05 (×7): qty 5

## 2023-01-05 MED ORDER — SODIUM CHLORIDE 0.9 % IV SOLN
500.0000 mg | INTRAVENOUS | Status: AC
  Administered 2023-01-05 – 2023-01-08 (×4): 500 mg via INTRAVENOUS
  Filled 2023-01-05 (×4): qty 5

## 2023-01-05 MED ORDER — IPRATROPIUM-ALBUTEROL 0.5-2.5 (3) MG/3ML IN SOLN
3.0000 mL | RESPIRATORY_TRACT | Status: DC | PRN
  Administered 2023-01-06 – 2023-01-09 (×3): 3 mL via RESPIRATORY_TRACT
  Filled 2023-01-05 (×3): qty 3

## 2023-01-05 MED ORDER — IPRATROPIUM-ALBUTEROL 0.5-2.5 (3) MG/3ML IN SOLN
3.0000 mL | RESPIRATORY_TRACT | Status: DC
  Administered 2023-01-05 (×2): 3 mL via RESPIRATORY_TRACT
  Filled 2023-01-05 (×2): qty 3

## 2023-01-05 NOTE — Progress Notes (Signed)
Cm attempted to see but pt out of room for procedure.

## 2023-01-05 NOTE — Progress Notes (Signed)
OT Cancellation Note  Patient Details Name: Agostina Eliason MRN: [id number] DOB: Jan 03, 1941   Cancelled Treatment:    Reason Eval/Treat Not Completed: Patient at procedure or test/ unavailable. Will check back later time.   Annison Birchard,HILLARY 01/05/2023, 2:30 PM Maurie Boettcher, OT/L   Acute OT Clinical Specialist Acute Rehabilitation Services Pager [phone] Office [phone]

## 2023-01-05 NOTE — Progress Notes (Signed)
Pt in no distress requiring bipap at this time.  Pt o2 turned off at this time for trial.  Pt tolerating well at this time.

## 2023-01-05 NOTE — Evaluation (Signed)
Clinical/Bedside Swallow Evaluation Patient Details  Name: [name] MRN: [id number] Date of Birth: [birth year]  Today's Date: 01/05/2023 Time: SLP Start Time (ACUTE ONLY): 58 SLP Stop Time (ACUTE ONLY): 1020 SLP Time Calculation (min) (ACUTE ONLY): 15 min  Past Medical History:  Past Medical History:  Diagnosis Date   Atrial fibrillation ([place])    Chronic back pain    "mid back down into lower back" (08/25/2014)   Diabetes mellitus without complication (HCC)    Frequent falls    GERD (gastroesophageal reflux disease)    Hypertriglyceridemia    OSA on CPAP    Osteoarthritis    "knees, hands, back" (08/25/2014)   Pneumonia ~ 2000 X 1   Subdural hematoma ([place]) july 2015   S/P fall while on Coumadin   T12 compression fracture ([place]) 2012   Type II diabetes mellitus ([place])    Past Surgical History:  Past Surgical History:  Procedure Laterality Date   APPENDECTOMY  2012   CATARACT EXTRACTION W/ INTRAOCULAR LENS  IMPLANT, BILATERAL Bilateral 2000's   IR GENERIC HISTORICAL  10/15/2016   IR PERCUTANEOUS ART THROMBECTOMY/INFUSION INTRACRANIAL INC DIAG ANGIO 10/15/2016 [name], MD [facility]   IR GENERIC HISTORICAL  12/13/2016   IR RADIOLOGIST EVAL & MGMT 12/13/2016 [facility]   RADIOLOGY WITH ANESTHESIA N/A 10/15/2016   Procedure: RADIOLOGY WITH ANESTHESIA;  Surgeon: [name], MD;  Location: [facility];  Service: Radiology;  Laterality: N/A;   TOTAL ABDOMINAL HYSTERECTOMY  1990   HPI:  Pt is an 82 year old [place] female who presented secondary to breathing difficulties. Pt reportedly had RSV 2 weeks prior to admission and had been improving in the outpatient setting until she started having worsening dyspnea on exertion. CT chest 3/20: Interval development of scattered bilateral airspace opacities, most conspicuous within the right middle lobe, nonspecific though worrisome for multifocal infection. Pt was scheduled to have an outpatient modified barium swallow  study 3/21. PMH: GERD, Subdural hematoma, PNA, DM, frequent falls, Afib. MBS 01/13/17: oropharyngeal swallow is within gross functional limits given age. A regular texture diet with thin liquids recommended at that time with request to consider esophageal assessment to rule out risk for post-prandial aspiration.    Assessment / Plan / Recommendation  Clinical Impression  Pt was seen for bedside swallow evaluation with her daughter present. Pt reported that she coughs with food and she attributed this to her "weak breath". Pt's daughter stated that the pt's tolerance of food and liquid has been inconsistent and that she has been symptomatic of aspiration since December. Per the daughter, the pt is most symptomatic with crumbly foods and with juicy foods (e.g., tomatoes, and oranges) which seem to squirt during mastication. Oral motor strength and ROM appeared grossly WFL and dentition was adequate with dentures, but these did not fit well and were removed for mastication. Pt's oral phase appeared [facility], but she demonstrated symptoms of pharyngeal dysphagia characterized by intermittent coughing with thin liquids and regular texture solids. Pt's regular diet was changed to NPO this morning by MD. SLP will defer diet recommendations until a modified barium swallow study is completed and this is scheduled for today at 1330. SLP Visit Diagnosis: Dysphagia, unspecified (R13.10)    Aspiration Risk  Mild aspiration risk    Diet Recommendation  (defer until MBS completed)   Medication Administration: Whole meds with puree Supervision: Staff to assist with self feeding Postural Changes: Seated upright at 90 degrees    Other  Recommendations Oral Care Recommendations: Oral care BID  Recommendations for follow up therapy are one component of a multi-disciplinary discharge planning process, led by the attending physician.  Recommendations may be updated based on patient status, additional functional criteria and  insurance authorization.  Follow up Recommendations  (TBD)      Assistance Recommended at Discharge    Functional Status Assessment Patient has not had a recent decline in their functional status  Frequency and Duration min 2x/week  2 weeks       Prognosis Prognosis for improved oropharyngeal function: Good Barriers to Reach Goals: Severity of deficits      Swallow Study   General Date of Onset: 01/13/17 HPI: Pt is an 82 year old Vietnamese-speaking female who presented secondary to breathing difficulties. Pt reportedly had RSV 2 weeks prior to admission and had been improving in the outpatient setting until she started having worsening dyspnea on exertion. CT chest 3/20: Interval development of scattered bilateral airspace opacities, most conspicuous within the right middle lobe, nonspecific though worrisome for multifocal infection. Pt was scheduled to have an outpatient modified barium swallow study 3/21. PMH: GERD, Subdural hematoma, PNA, DM, frequent falls, Afib. MBS 01/13/17: oropharyngeal swallow is within gross functional limits given age. A regular texture diet with thin liquids recommended at that time with request to consider esophageal assessment to rule out risk for post-prandial aspiration. Type of Study: Bedside Swallow Evaluation Previous Swallow Assessment: See HPI Diet Prior to this Study: Regular;Thin liquids (Level 0) Temperature Spikes Noted: No Respiratory Status: Room air History of Recent Intubation: No Behavior/Cognition: Alert;Cooperative;Pleasant mood Oral Cavity Assessment: Within Functional Limits Oral Care Completed by SLP: No Oral Cavity - Dentition: Dentures, top;Dentures, bottom;Missing dentition (four natural mandibular incisors) Vision: Functional for self-feeding Self-Feeding Abilities: Able to feed self Patient Positioning: Upright in bed;Postural control adequate for testing Baseline Vocal Quality: Normal Volitional Swallow: Able to elicit     Oral/Motor/Sensory Function Overall Oral Motor/Sensory Function: Within functional limits   Ice Chips Ice chips: Not tested   Thin Liquid Thin Liquid: Impaired Presentation: Cup;Straw Pharyngeal  Phase Impairments: Cough - Immediate;Cough - Delayed    Nectar Thick Nectar Thick Liquid: Not tested   Honey Thick Honey Thick Liquid: Not tested   Puree Puree: Within functional limits Presentation: Spoon   Solid     Solid: Impaired Presentation: Self Fed Pharyngeal Phase Impairments: Cough - Immediate;Cough - Delayed     Sharell Hilmer I. Hardin Negus, [place], [place] Office number [phone]  Horton Marshall 01/05/2023,10:37 AM

## 2023-01-05 NOTE — Progress Notes (Signed)
Modified Barium Swallow Study  Patient Details  Name: [name] MRN: [id number] Date of Birth: 1941/09/03  Today's Date: 01/05/2023  Modified Barium Swallow completed.  Full report located under Chart Review in the Imaging Section.  History of Present Illness Pt is an 82 year old Vietnamese-speaking female who presented secondary to breathing difficulties. Pt reportedly had RSV 2 weeks prior to admission and had been improving in the outpatient setting until she started having worsening dyspnea on exertion. CT chest 3/20: Interval development of scattered bilateral airspace opacities, most conspicuous within the right middle lobe, nonspecific though worrisome for multifocal infection. Pt was scheduled to have an outpatient modified barium swallow study 3/21. PMH: GERD, Subdural hematoma, PNA, DM, frequent falls, Afib. MBS 01/13/17: oropharyngeal swallow is within gross functional limits given age. A regular texture diet with thin liquids recommended at that time with request to consider esophageal assessment to rule out risk for post-prandial aspiration.   Clinical Impression [employer] Viet[place]amese interpreter, [name] (ID# [id number]), was used for translation. Pt presents with pharyngeal impairments including reduced tongue base retraction, reduced anterior laryngeal movement, and a pharyngeal delay. The swallow was often triggered with the head of the liquid bolus at the level of the hypopharynx. This resulted in intermittent transient penetration (PAS 2) with larger boluses (e.g., 2oz) of thin liquids, but this is considered WNL. Coughing was noted intermittently, but this did not align with any instances of penetration and aspiration was not observed during the study. When compared with video recording of the pt's MBS in 2018, pt's overall swallow mechanism appears comparable and is still judged to be [facility]. Considering pt's history of GERD, her reported infrequent use of medication, and the  daughter's report of increased coughing after intake of acidic or spicy foods, SLP questions the potential for post-prandial aspiration and recommends that esophageal assessment (e.g., esophagram) be considered. A dysphagia 3 diet with thin liquids is recommended at this time since pt's daughter reported that pt is more symptomatic for very dry foods; SLP will follow briefly. Factors that may increase risk of adverse event in presence of aspiration Phineas Douglas & Padilla 2021): Respiratory or GI disease  Swallow Evaluation Recommendations Recommendations: PO diet PO Diet Recommendation: Dysphagia 3 (Mechanical soft);Thin liquids (Level 0) Liquid Administration via: Cup;Straw Medication Administration: Whole meds with liquid (or with puree; per pt preference) Supervision: Staff to assist with self-feeding Swallowing strategies  : Slow rate;Small bites/sips Postural changes: Stay upright 30-60 min after meals;Position pt fully upright for meals Oral care recommendations: Oral care BID (2x/day) Recommended consults: Consider esophageal assessment    Rand Boller I. Hardin Negus, [place], [place] Office number [phone]  Horton Marshall 01/05/2023,4:11 PM

## 2023-01-05 NOTE — Plan of Care (Signed)

## 2023-01-05 NOTE — Progress Notes (Signed)
Patient daughter is currently visiting and she reports to be positive for Covid after a home testing. She is having respiratory symptoms including a cough. Daughter advised to go home and remain home until symptoms are resolved.  Patient was negative on admission. She asked if patient needs to be retested, MD made aware via secure chat

## 2023-01-05 NOTE — Evaluation (Signed)
Physical Therapy Evaluation Patient Details Name: [name] MRN: [id number] DOB: 1941-03-18 Today's Date: 01/05/2023  History of Present Illness  82 year old [place] female, presents to ED via EMS 3/20. Pt tachycardic with intermittent desaturations in high 80s on RA. Chest CT suggestive of PNA. Admitted for presumed sepsis due to PNA PMH Severe case if RSV leading to hospitalization 1/24, repeated falls, 2 requiring visits to the ED. DM2, A-fib, acute cerebral infarct, 2017.  Clinical Impression  Utilized interpreter for session. Pt is very talkative and difficult to determine if not answering questions directly is due to cognition or interpretation. Pt reports PTA pt living with extended family including 8 grandchildren and that someone is always home with her. Pt reports using RW for ambulation at home and in grocery store. Pt bed and bath upstairs. Pt reports independence with ADLs and generally cooks for the entire family. Pt is currently limited in safe mobility by 3/4 DoE with short distance ambulation, and need for 2L O2 via [place] to maintain SpO2 >92%O2 with ambulation. Pt is min guard for transfers and light min A for ambulation in room with RW. PT recommending HHPT at discharge if able to work out with daughter for interpretation. PT will continue to follow acutely and refer to Mobility Specialist.      Recommendations for follow up therapy are one component of a multi-disciplinary discharge planning process, led by the attending physician.  Recommendations may be updated based on patient status, additional functional criteria and insurance authorization.  Follow Up Recommendations Home health PT      Assistance Recommended at Discharge Frequent or constant Supervision/Assistance  Patient can return home with the following  A little help with walking and/or transfers;A little help with bathing/dressing/bathroom;Assistance with cooking/housework;Direct supervision/assist for medications  management;Direct supervision/assist for financial management;Assist for transportation;Help with stairs or ramp for entrance    Equipment Recommendations None recommended by PT     Functional Status Assessment Patient has had a recent decline in their functional status and demonstrates the ability to make significant improvements in function in a reasonable and predictable amount of time.     Precautions / Restrictions Precautions Precautions: Fall Precaution Comments: fallen 2x in last week Restrictions Weight Bearing Restrictions: No      Mobility  Bed Mobility               General bed mobility comments: sitting in recliner on entry    Transfers Overall transfer level: Needs assistance Equipment used: Rolling walker (2 wheels) Transfers: Sit to/from Stand Sit to Stand: Min guard           General transfer comment: good power up and self steadying at RW    Ambulation/Gait Ambulation/Gait assistance: Min assist Gait Distance (Feet): 30 Feet Assistive device: Rolling walker (2 wheels) Gait Pattern/deviations: Step-through pattern, Decreased weight shift to right Gait velocity: slowed Gait velocity interpretation: <1.31 ft/sec, indicative of household ambulator   General Gait Details: min a for slowed, shuffling gait        Balance Overall balance assessment: Needs assistance Sitting-balance support: Feet supported, No upper extremity supported Sitting balance-Leahy Scale: Fair     Standing balance support: Bilateral upper extremity supported, During functional activity, Reliant on assistive device for balance Standing balance-Leahy Scale: Poor Standing balance comment: requires UE support for maintaining balance                             Pertinent Vitals/Pain Pain Assessment  Pain Assessment: No/denies pain (rubs R knee after walking, denies it hurts)    Home Living Family/patient expects to be discharged to:: Private  residence Living Arrangements: Children Available Help at Discharge: Family Type of Home: House Home Access: Stairs to enter Entrance Stairs-Rails: Left Entrance Stairs-Number of Steps: 2 Alternate Level Stairs-Number of Steps: 12 Home Layout: Two level;Bed/bath upstairs Home Equipment: Rollator (4 wheels);Rolling Walker (2 wheels);Tub bench;BSC/3in1      Prior Function Prior Level of Function : Needs assist             Mobility Comments: ambulates with Rollator in home and grocery store, hx of falling so family always walks with her ADLs Comments: reports independence with ADLs, cooks for her large family and does grocery shopping        Extremity/Trunk Assessment   Upper Extremity Assessment Upper Extremity Assessment: Defer to OT evaluation    Lower Extremity Assessment Lower Extremity Assessment: RLE deficits/detail;LLE deficits/detail RLE Deficits / Details: ROM WFL, strength grossly 3+/5, rubs R knee after ambulation but denies pain LLE Deficits / Details: ROM WFL, strength grossly 3+?5    Cervical / Trunk Assessment Cervical / Trunk Assessment: Kyphotic  Communication   Communication: Prefers language other than English ([employer] [id number] Sherri)  Cognition Arousal/Alertness: Awake/alert Behavior During Therapy: WFL for tasks assessed/performed Overall Cognitive Status: Difficult to assess                                 General Comments: very talkative, good command follow        General Comments General comments (skin integrity, edema, etc.): at rest SpO2 on RA 96%O2, requires 2L O2 via [place], for maintaining SpO2 92%O2 with ambulation in room, HR 110-134  pt with c/o of dizziness with positional change, sitting 125/81, standing 120/76,        Assessment/Plan    PT Assessment Patient needs continued PT services  PT Problem List Decreased strength;Decreased activity tolerance;Decreased balance;Decreased mobility;Cardiopulmonary status  limiting activity       PT Treatment Interventions DME instruction;Gait training;Stair training;Functional mobility training;Therapeutic activities;Therapeutic exercise;Balance training;Cognitive remediation;Patient/family education    PT Goals (Current goals can be found in the Care Plan section)  Acute Rehab PT Goals PT Goal Formulation: With patient Time For Goal Achievement: 01/19/23 Potential to Achieve Goals: Fair    Frequency Min 1X/week        AM-PAC PT "6 Clicks" Mobility  Outcome Measure Help needed turning from your back to your side while in a flat bed without using bedrails?: None Help needed moving from lying on your back to sitting on the side of a flat bed without using bedrails?: None Help needed moving to and from a bed to a chair (including a wheelchair)?: None Help needed standing up from a chair using your arms (e.g., wheelchair or bedside chair)?: None Help needed to walk in hospital room?: A Little Help needed climbing 3-5 steps with a railing? : A Lot 6 Click Score: 21    End of Session Equipment Utilized During Treatment: Gait belt;Oxygen Activity Tolerance: Patient limited by fatigue Patient left: in chair;with call bell/phone within reach;with chair alarm set Nurse Communication: Mobility status PT Visit Diagnosis: Muscle weakness (generalized) (M62.81);Other abnormalities of gait and mobility (R26.89);History of falling (Z91.81);Repeated falls (R29.6);Unsteadiness on feet (R26.81);Dizziness and giddiness (R42)    Time: [id number] PT Time Calculation (min) (ACUTE ONLY): 57 min   Charges:   PT Evaluation $PT  Eval Moderate Complexity: 1 Mod PT Treatments $Therapeutic Activity: 23-37 mins        Brittany Amirault B. Migdalia Dk PT, DPT Acute Rehabilitation Services Please use secure chat or  Call Office [phone]   [place] 01/05/2023, 12:59 PM

## 2023-01-05 NOTE — Progress Notes (Signed)
                 Interval history Feeling much better overall.  Breathing is improved.  Still with productive cough and some chest pain on coughing.  Physical exam Blood pressure 123/74, pulse (!) 102, temperature 98.8 F (37.1 C), temperature source Oral, resp. rate 19, height 4\' 8"  (1.422 m), weight 61 kg, SpO2 100 %.  Comfortable appearing Heart rate tachycardic, rhythm irregularly irregular, radial pulses strong Breathing is regular and unlabored on 2 L/min via nasal cannula, diffuse wheezing and coarse crackles all lung fields Skin is warm and dry Alert and oriented Pleasant, mood and affect are concordant  Labs, images, and other studies Electrolytes creatinine stable Lactate 1.1 down from peak of 2.6 WBC down to 10 from 14.1 Hemoglobin 8.6 RVP negative Blood cultures no growth x 24 hours  Assessment and plan Hospital day [place] is a 82 y.o. admitted for acute hypoxic respiratory failure and sepsis due to multifocal pneumonia.  Principal Problem:   Acute hypoxic respiratory failure (HCC) Active Problems:   Diabetes ([place])   Atrial fibrillation with RVR (HCC)   Macrocytic anemia   Sepsis (HCC)   Pneumonia  Sepsis Multifocal pneumonia, predominantly right middle lobe Acute hypoxic respiratory failure Much improved since yesterday.  Less symptom burden.  Lower O2 requirement.  Lactate normal.  No leukocytosis.  No growth on cultures.  Lots of wheezing still, will schedule DuoNebs.  Up at night with cough, will add Tussionex.  Will continue IV Unasyn and add back azithromycin for atypical coverage given multifocal opacities. - IV Unasyn and azithromycin - DuoNebs every 4 hours - Guaifenesin and Tussionex for cough - O2 sats >94% - Follow blood cultures   Atrial fibrillation RVR In setting of acute hypoxic respiratory failure pneumonia.  Will restart home metoprolol. - Metoprolol 25 mg twice daily - Apixaban 5 mg twice daily   Dysphagia  Recent functional  decline Appreciate SLP assistance with this case.  MBS pending. - N.p.o. for now - Follow-up MBS   Diabetes Last hemoglobin A1c 6.9.  On metformin outpatient. - SSI moderate  Macrocytic anemia Chronic macrocytosis. Worsening anemia over several weeks to months. B12 level within normal limits in February 2024.  Folate within normal limits.  Incidental findings on CT chest Include lesions with suspicious lytic appearance and sternum and manubrium in addition to nondisplaced fracture of right-sided manubrium.  Question multiple myeloma versus old injuries from recent falls.  Also with nodular liver suggestive of cirrhosis.  Does not have obvious stigmata of cirrhosis on exam. - Will add these issues to discharge summary for follow-up  Diet: N.p.o. for now IVF: None VTE:  Therapeutic apixaban Code: Full PT/OT recommendations: Home health PT Family Update: At bedside  [name] MD 01/05/2023, 7:05 AM  Pager: [phone] After 5pm or weekend: [phone]

## 2023-01-06 DIAGNOSIS — J189 Pneumonia, unspecified organism: Secondary | ICD-10-CM | POA: Diagnosis not present

## 2023-01-06 DIAGNOSIS — A419 Sepsis, unspecified organism: Secondary | ICD-10-CM | POA: Diagnosis not present

## 2023-01-06 DIAGNOSIS — J9601 Acute respiratory failure with hypoxia: Secondary | ICD-10-CM | POA: Diagnosis not present

## 2023-01-06 LAB — KAPPA/LAMBDA LIGHT CHAINS
Kappa free light chain: 10.8 mg/L (ref 3.3–19.4)
Kappa, lambda light chain ratio: 1.35 (ref 0.26–1.65)
Lambda free light chains: 8 mg/L (ref 5.7–26.3)

## 2023-01-06 LAB — BASIC METABOLIC PANEL
Anion gap: 9 (ref 5–15)
BUN: 12 mg/dL (ref 8–23)
CO2: 25 mmol/L (ref 22–32)
Calcium: 8.4 mg/dL — ABNORMAL LOW (ref 8.9–10.3)
Chloride: 105 mmol/L (ref 98–111)
Creatinine, Ser: 0.78 mg/dL (ref 0.44–1.00)
GFR, Estimated: >60 mL/min (ref >60)
Glucose, Bld: 129 mg/dL — ABNORMAL HIGH (ref 70–99)
Potassium: 3.9 mmol/L (ref 3.5–5.1)
Sodium: 139 mmol/L (ref 135–145)

## 2023-01-06 LAB — RESP PANEL BY RT-PCR (RSV, FLU A&B, COVID)  RVPGX2
Influenza A by PCR: NEGATIVE
Influenza B by PCR: NEGATIVE
Resp Syncytial Virus by PCR: NEGATIVE
SARS Coronavirus 2 by RT PCR: NEGATIVE

## 2023-01-06 LAB — CBC
HCT: 29 % — ABNORMAL LOW (ref 36.0–46.0)
Hemoglobin: 9.2 g/dL — ABNORMAL LOW (ref 12.0–15.0)
MCH: 33.8 pg (ref 26.0–34.0)
MCHC: 31.7 g/dL (ref 30.0–36.0)
MCV: 106.6 fL — ABNORMAL HIGH (ref 80.0–100.0)
Platelets: 399 10*3/uL (ref 150–400)
RBC: 2.72 MIL/uL — ABNORMAL LOW (ref 3.87–5.11)
RDW: 14.3 % (ref 11.5–15.5)
WBC: 13.4 10*3/uL — ABNORMAL HIGH (ref 4.0–10.5)
nRBC: 0.3 % — ABNORMAL HIGH (ref 0.0–0.2)

## 2023-01-06 LAB — GLUCOSE, CAPILLARY
Glucose-Capillary: 114 mg/dL — ABNORMAL HIGH (ref 70–99)
Glucose-Capillary: 106 mg/dL — ABNORMAL HIGH (ref 70–99)
Glucose-Capillary: 124 mg/dL — ABNORMAL HIGH (ref 70–99)
Glucose-Capillary: 113 mg/dL — ABNORMAL HIGH (ref 70–99)
Glucose-Capillary: 128 mg/dL — ABNORMAL HIGH (ref 70–99)

## 2023-01-06 MED ORDER — APIXABAN 2.5 MG PO TABS
2.5000 mg | ORAL_TABLET | Freq: Two times a day (BID) | ORAL | Status: DC
  Administered 2023-01-06 – 2023-01-09 (×6): 2.5 mg via ORAL
  Filled 2023-01-06 (×6): qty 1

## 2023-01-06 MED ORDER — METHYLPREDNISOLONE SODIUM SUCC 125 MG IJ SOLR
125.0000 mg | Freq: Once | INTRAMUSCULAR | Status: AC
  Administered 2023-01-06: 125 mg via INTRAVENOUS
  Filled 2023-01-06: qty 2

## 2023-01-06 NOTE — Progress Notes (Signed)
Pt complains of increased sob , sats are 88 with 02 off , pt had pulled 02 off. Pt increases to [place] called, educated pt on the importance of leaving 02 on. Pt wheezing despite breathing treatment, sats 90-92% , provider notified and now at bedside.

## 2023-01-06 NOTE — Progress Notes (Signed)
                 Interval history Feeling overall worse today.  Had some increased breathing difficulty around 3 PM yesterday that progressed into the night.  Associated with worsening cough and wheezing.  Physical exam Blood pressure (!) 156/93, pulse 100, temperature 98.9 F (37.2 C), temperature source Oral, resp. rate 19, height 4\' 8"  (1.422 m), weight 61 kg, SpO2 94 %.  Uncomfortable appearing Heart rate tachycardic, rhythm irregular, radial pulses strong Breathing is tachypneic and labored on nasal cannula, coarse crackles most notably in right lower lobe with crackles and wheezing throughout all lung fields Skin is warm and dry Alert and oriented  Labs, images, and other studies Electrolytes and creatinine stable WBC 13.4 increased Hemoglobin 9.2 stable  Assessment and plan Hospital day [place] is a 82 y.o. admitted for multifocal pneumonia with acute hypoxic respiratory failure and sepsis.  Principal Problem:   Acute hypoxic respiratory failure (HCC) Active Problems:   Diabetes ([place])   Atrial fibrillation with RVR (HCC)   Macrocytic anemia   Sepsis (HCC)   Pneumonia  Sepsis Multifocal pneumonia, predominantly right middle lobe Acute hypoxic respiratory failure Worsening since yesterday.  Remains stable on 4 L via nasal cannula.  Lung sounds are present in all lung fields indicating good air movement, but still has predominantly right-sided coarse crackles and pretty severe diffuse wheezing.  Question underlying reactive airway disease like COPD or asthma in addition to inflammation from her multifocal pneumonia.  Do not suspect ARDS.  Repeat COVID test was negative, performed because daughter who accompanied her to the hospital became symptomatic and tested positive.  Considered broadening antibiotic spectrum but no real risk factors for MRSA or Pseudomonas. - Methylprednisolone 125 mg IV x 1 - IV Unasyn and azithromycin - DuoNebs every 4 hours - Guaifenesin and  Tussionex for cough - O2 sats >94% - Follow blood cultures   Atrial fibrillation RVR In setting of acute hypoxic respiratory failure pneumonia.  Currently on home medications.  Wonder about reducing apixaban dose based on age >41, weight 61 kg, and history of falls with bleeding complications. - Metoprolol 25 mg twice daily - Apixaban 5 mg twice daily   Dysphagia  Recent functional decline MBS concerning for esophageal dysphagia.  Dysphagia 3 diet with thin liquids recommended.  Will consider esophagram when she is recovered from her pneumonia. - Dysphagia 3 with thin liquids   Diabetes Last hemoglobin A1c 6.9.  On metformin outpatient. - SSI moderate   Macrocytic anemia Chronic macrocytosis. Worsening anemia over several weeks to months. B12 level within normal limits in February 2024.  Folate within normal limits.  Sent SPEP, kappa lambda light chains, and immunofixation yesterday. - Follow-up multiple myeloma panel   Incidental findings on CT chest Include lesions with suspicious lytic appearance and sternum and manubrium in addition to nondisplaced fracture of right-sided manubrium.  Question multiple myeloma versus old injuries from recent falls.  Also with nodular liver suggestive of cirrhosis.  Does not have obvious stigmata of cirrhosis on exam. - Will add these issues to discharge summary for follow-up  Diet: Dysphagia 3 with thin liquids IVF: None VTE: Therapeutic apixaban Code: Full PT/OT recommendations: Home health OT pending progress TOC recommendations: Following Family Update: Plan to update by phone   [name] MD 01/06/2023, 6:44 AM  Pager: [phone] After 5pm or weekend: [phone]

## 2023-01-06 NOTE — Progress Notes (Signed)
Physical Therapy Treatment Patient Details Name: [name] MRN: [id number] DOB: January 20, 1941 Today's Date: 01/06/2023   History of Present Illness 82 year old [place] female, presents to ED via EMS 3/20. Pt tachycardic with intermittent desaturations in high 80s on RA. Chest CT suggestive of PNA. Admitted for presumed sepsis due to PNA PMH Severe case if RSV leading to hospitalization 1/24, repeated falls, 2 requiring visits to the ED. DM2, A-fib, acute cerebral infarct, 2017.    PT Comments    Pt received in supine, lethargic but participatory with increased time/cues and agreeable to therapy session with good participation and fair tolerance for transfer training, needing up to modA to perform functional mobility tasks. Pt with poor standing tolerance due to c/o severe fatigue (unable to assess standing orthostatics due to fatigue), pt agreeable to step pivot to recliner chair and chair alarm on for safety in recliner at end of session. Pt's son present in room to visit at end of session. Translation via interpreter Sharyn Lull # [phone]. SpO2 WFL on 4L O2 [place] when good pleth signal achieved. Noisy signal while pt gripping RW/bed rail. Mild to moderate DOE 2/4 and secretion-laden cough throughout. Pt continues to benefit from PT services to progress toward functional mobility goals.       Recommendations for follow up therapy are one component of a multi-disciplinary discharge planning process, led by the attending physician.  Recommendations may be updated based on patient status, additional functional criteria and insurance authorization.  Follow Up Recommendations  Home health PT     Assistance Recommended at Discharge Frequent or constant Supervision/Assistance  Patient can return home with the following A little help with bathing/dressing/bathroom;Assistance with cooking/housework;Direct supervision/assist for medications management;Direct supervision/assist for financial management;Assist for  transportation;Help with stairs or ramp for entrance;A lot of help with walking and/or transfers   Equipment Recommendations   (TBD)    Recommendations for Other Services       Precautions / Restrictions Precautions Precautions: Fall Precaution Comments: fallen 2x in last week; covid exposure this week, airborne/contact precs Restrictions Weight Bearing Restrictions: No     Mobility  Bed Mobility Overal bed mobility: Needs Assistance Bed Mobility: Supine to Sit     Supine to sit: Mod assist     General bed mobility comments: assist to fully advance BLE, powerup trunk and pivot hips to EOB position. Increased time/effort to initiate and cues for line awareness    Transfers Overall transfer level: Needs assistance Equipment used: Rolling walker (2 wheels) Transfers: Sit to/from Stand, Bed to chair/wheelchair/BSC Sit to Stand: Min assist, From elevated surface   Step pivot transfers: Mod assist       General transfer comment: Pt fatigued and drowsy appearing but following cues for sequencing and UE placement, once standing pt c/o increased fatigue and had planned on ambulating to bathroom ~81ft from bed but instead had to pivot to recliner. Pt using purewick once seated.    Ambulation/Gait             Pre-gait activities: shuffled steps toward her L side and anterior for pivotal transfer to recliner from EOB using RW, too fatigued for gait trial        Balance Overall balance assessment: Needs assistance Sitting-balance support: Feet supported, No upper extremity supported Sitting balance-Leahy Scale: Fair   Postural control: Posterior lean Standing balance support: Bilateral upper extremity supported, During functional activity, Reliant on assistive device for balance Standing balance-Leahy Scale: Poor Standing balance comment: requires UE support and external assist with gait  belt for maintaining balance                            Cognition  Arousal/Alertness: Lethargic Behavior During Therapy: Flat affect Overall Cognitive Status: Difficult to assess                                 General Comments: Pt took increased time to wake for participation in session, very congested secretion-laden cough but pt unable to clear secretions, pt son entered the room midway through session and encouraging. Pt motivated to get OOB and ambulate, but severely fatigued after pivot to chair and falling asleep once reclined in the chair. Pt closing her eyes frequently during the session needing reminders for eyes open.        Exercises Other Exercises Other Exercises: supine BLE A/AAROM: ankle pumps/circles, heel slides, hip abduction x5-10 reps ea Other Exercises: Flutter valve x5 attempts, poor carryover instruction (very lethargic at that time may have been due to fatigue)    General Comments General comments (skin integrity, edema, etc.): SpO2 WFL on 4L O2 [place] during functional mobility tasks, when pt tightly grips bed rail poor signal on finger pleth sensor and once finger relaxed, SpO2 WFL >92% throughout. HR 92 bpm seated to 116 bpm with step pivot transfer; no c/o dizziness during transfer, only fatigue.      Pertinent Vitals/Pain Pain Assessment Pain Assessment: Faces Faces Pain Scale: Hurts a little bit Pain Location: knees Pain Descriptors / Indicators: Discomfort, Sore, Grimacing Pain Intervention(s): Monitored during session, Repositioned     PT Goals (current goals can now be found in the care plan section) Acute Rehab PT Goals Patient Stated Goal: to get OOB and walk PT Goal Formulation: With patient Time For Goal Achievement: 01/19/23 Progress towards PT goals: Progressing toward goals    Frequency    Min 1X/week      PT Plan Current plan remains appropriate       AM-PAC PT "6 Clicks" Mobility   Outcome Measure  Help needed turning from your back to your side while in a flat bed without using  bedrails?: A Little Help needed moving from lying on your back to sitting on the side of a flat bed without using bedrails?: A Lot Help needed moving to and from a bed to a chair (including a wheelchair)?: A Lot Help needed standing up from a chair using your arms (e.g., wheelchair or bedside chair)?: A Lot Help needed to walk in hospital room?: Total Help needed climbing 3-5 steps with a railing? : Total 6 Click Score: 11    End of Session Equipment Utilized During Treatment: Gait belt;Oxygen Activity Tolerance: Patient limited by fatigue;Patient limited by lethargy Patient left: in chair;with call bell/phone within reach;with chair alarm set;with nursing/sitter in room;with family/visitor present (family (son) present, RN in room to address IV which was complete, BSC brought to room and handed to RN to bring in) Nurse Communication: Mobility status PT Visit Diagnosis: Muscle weakness (generalized) (M62.81);Other abnormalities of gait and mobility (R26.89);History of falling (Z91.81);Repeated falls (R29.6);Unsteadiness on feet (R26.81);Dizziness and giddiness (R42)     Time: 1551-1630 PT Time Calculation (min) (ACUTE ONLY): 39 min  Charges:  $Therapeutic Exercise: 8-22 mins $Therapeutic Activity: 23-37 mins                     Mirakle Tomlin P., PTA Acute Rehabilitation  Services Secure Chat Preferred 9a-5:30pm Office: [place] 01/06/2023, 6:03 PM

## 2023-01-06 NOTE — Progress Notes (Signed)
Infection Prevention  Unit: 2C Regarding: In hospital Covid exposure to family member IP Recommendation: Per Progress note:  3/21 1300hr. Pt daughter visited and tested positive Covid with home test. Per [name] displayed resp symptoms. Pt was retested this a.m. No Covid. Advised [name] Covid transmission period is 2-5 days; testing will be needed in 48 hrs to insure negative covid. [name] had a Covid test this a.m. that was negative. She will need a followup test Sunday a.m. to insure that she did not contract Covid from her daughter. Airborne/Contact precautions needed until the second test results on Sunday.

## 2023-01-06 NOTE — Progress Notes (Signed)
   01/06/23 0836  Assess: MEWS Score  Temp 98.6 F (37 C)  BP (!) 140/109  Pulse Rate (!) 119  ECG Heart Rate (!) 114  Resp (!) 24  Level of Consciousness Alert  Assess: MEWS Score  MEWS Temp 0  MEWS Systolic 0  MEWS Pulse 2  MEWS RR 1  MEWS LOC 0  MEWS Score 3  MEWS Score Color Yellow  Assess: if the MEWS score is Yellow or Red  Were vital signs taken at a resting state? Yes  Focused Assessment No change from prior assessment  Does the patient meet 2 or more of the SIRS criteria? No  Does the patient have a confirmed or suspected source of infection? No  Provider and Rapid Response Notified? No  MEWS guidelines implemented  Yes, yellow  Treat  MEWS Interventions Considered administering scheduled or prn medications/treatments as ordered  Take Vital Signs  Increase Vital Sign Frequency  Yellow: Q2hr x1, continue Q4hrs until patient remains green for 12hrs  Escalate  MEWS: Escalate Yellow: Discuss with charge nurse and consider notifying provider and/or RRT  Notify: Charge Nurse/RN  Name of Charge Nurse/RN Notified [place], RN  Assess: SIRS CRITERIA  SIRS Temperature  0  SIRS Pulse 1  SIRS Respirations  1  SIRS WBC 1  SIRS Score Sum  3

## 2023-01-06 NOTE — Evaluation (Signed)
Occupational Therapy Evaluation Patient Details Name: [name] MRN: [id number] DOB: [birth year] Today's Date: 01/06/2023   History of Present Illness 82 year old [place] female, presents to ED via EMS 3/20. Pt tachycardic with intermittent desaturations in high 80s on RA. Chest CT suggestive of PNA. Admitted for presumed sepsis due to PNA PMH Severe case if RSV leading to hospitalization 1/24, repeated falls, 2 requiring visits to the ED. DM2, A-fib, acute cerebral infarct, 2017.   Clinical Impression   Pt admitted with the above diagnoses and presents with below problem list. Pt will benefit from continued acute OT to address the below listed deficits and maximize independence with basic ADLs prior to d/c home. At baseline, pt lives with her family and is mod I with ADLs. Pt limited this session by onset of dizziness once EOB and SpO2 dropping into 70s (lowest: 67, highest: 93). Pt on 4L SpO2 at start and end of session, temporarily bumped up to 5L while EOB. Pt sat about 1 minute EOB before needing to lie down this session. Pt currently needs up to mod A with ADLs.        Recommendations for follow up therapy are one component of a multi-disciplinary discharge planning process, led by the attending physician.  Recommendations may be updated based on patient status, additional functional criteria and insurance authorization.   Follow Up Recommendations  Home health OT Pending progress     Assistance Recommended at Discharge Frequent or constant Supervision/Assistance  Patient can return home with the following A little help with walking and/or transfers;A lot of help with bathing/dressing/bathroom;Direct supervision/assist for medications management;Assistance with cooking/housework;Assist for transportation    Functional Status Assessment  Patient has had a recent decline in their functional status and demonstrates the ability to make significant improvements in function in a reasonable  and predictable amount of time.  Equipment Recommendations  None recommended by OT    Recommendations for Other Services       Precautions / Restrictions Precautions Precautions: Fall Precaution Comments: fallen 2x in last week Restrictions Weight Bearing Restrictions: No      Mobility Bed Mobility Overal bed mobility: Needs Assistance Bed Mobility: Supine to Sit, Sit to Supine     Supine to sit: Mod assist Sit to supine: Mod assist   General bed mobility comments: assist to fully advance BLE, powerup trunk and pivot hips to EOB position. Assist to advance BLE onto bed to return to supine    Transfers                   General transfer comment: Not safe to attempt this session d/t dropping SpO2 and + dizziness EOB      Balance Overall balance assessment: Needs assistance Sitting-balance support: Feet supported, No upper extremity supported Sitting balance-Leahy Scale: Fair                                     ADL either performed or assessed with clinical judgement   ADL Overall ADL's : Needs assistance/impaired Eating/Feeding: Set up;Sitting   Grooming: Set up;Sitting   Upper Body Bathing: Minimal assistance;Sitting   Lower Body Bathing: Moderate assistance;Sitting/lateral leans   Upper Body Dressing : Set up;Sitting   Lower Body Dressing: Moderate assistance;Sitting/lateral leans                 General ADL Comments: Pt sat EOB about 1 minutes. Unable to mobilize further d/t +  dizziness and dropping SpO2.     Vision         Perception     Praxis      Pertinent Vitals/Pain Pain Assessment Pain Assessment: Faces Faces Pain Scale: Hurts a little bit Pain Location: knees Pain Descriptors / Indicators: Discomfort, Sore Pain Intervention(s): Limited activity within patient's tolerance, Monitored during session, Repositioned     Hand Dominance     Extremity/Trunk Assessment Upper Extremity Assessment Upper Extremity  Assessment: Generalized weakness   Lower Extremity Assessment Lower Extremity Assessment: Defer to PT evaluation   Cervical / Trunk Assessment Cervical / Trunk Assessment: Kyphotic   Communication Communication Communication: Prefers language other than English (Engineer, petroleum utilized)   Cognition Arousal/Alertness: Awake/alert Behavior During Therapy: WFL for tasks assessed/performed Overall Cognitive Status: Difficult to assess                                       General Comments  Pt on 4L SpO2 at start and end of session. Bumped up to 5L during EOB activity d/t dropping SpO2 and + dizziness. EOB SpO2 varied between 70-80s, occasional peak into low 90s, lowest was 67. Pt returned to supine after about a minute.    Exercises     Shoulder Instructions      Home Living Family/patient expects to be discharged to:: Private residence Living Arrangements: Children Available Help at Discharge: Family Type of Home: House Home Access: Stairs to enter Technical brewer of Steps: 2 Entrance Stairs-Rails: Left Home Layout: Two level;Bed/bath upstairs Alternate Level Stairs-Number of Steps: 12 Alternate Level Stairs-Rails: Right Bathroom Shower/Tub: Teacher, early years/pre: Handicapped height Bathroom Accessibility: Yes   Home Equipment: Rollator (4 wheels);Rolling Walker (2 wheels);Tub bench;BSC/3in1          Prior Functioning/Environment Prior Level of Function : Needs assist  Cognitive Assist : ADLs (cognitive)     Physical Assist : ADLs (physical)   ADLs (physical): Bathing;Dressing Mobility Comments: ambulates with Rollator in home and grocery store, hx of falling so family always walks with her ADLs Comments: reports independence with ADLs, cooks for her large family and does grocery shopping        OT Problem List: Decreased strength;Decreased activity tolerance;Impaired balance (sitting and/or standing);Decreased knowledge  of use of DME or AE;Decreased knowledge of precautions;Cardiopulmonary status limiting activity;Pain      OT Treatment/Interventions: Self-care/ADL training;Energy conservation;DME and/or AE instruction;Balance training;Patient/family education;Therapeutic activities;Therapeutic exercise    OT Goals(Current goals can be found in the care plan section) Acute Rehab OT Goals Patient Stated Goal: not stated OT Goal Formulation: With patient Time For Goal Achievement: 01/20/23 Potential to Achieve Goals: Good ADL Goals Pt Will Perform Grooming: sitting;with supervision;with set-up Pt Will Perform Upper Body Dressing: with set-up;sitting Pt Will Perform Lower Body Dressing: with min guard assist;sit to/from stand Pt Will Transfer to Toilet: with min guard assist;ambulating;bedside commode Pt Will Perform Toileting - Clothing Manipulation and hygiene: with min assist;sit to/from stand;with min guard assist;sitting/lateral leans Additional ADL Goal #1: Pt will complete bed mobility at min guard level to prepare for EOB/OOB ADLs.  OT Frequency: Min 2X/week    Co-evaluation              AM-PAC OT "6 Clicks" Daily Activity     Outcome Measure Help from another person eating meals?: None Help from another person taking care of personal grooming?: None Help from another person  toileting, which includes using toliet, bedpan, or urinal?: A Lot Help from another person bathing (including washing, rinsing, drying)?: A Lot Help from another person to put on and taking off regular upper body clothing?: A Little Help from another person to put on and taking off regular lower body clothing?: A Lot 6 Click Score: 17   End of Session Equipment Utilized During Treatment: Oxygen Nurse Communication: Other (comment);Mobility status (SpO2)  Activity Tolerance: Patient limited by fatigue;Other (comment) (+ dizziness EOB. intermittent episodes of tachycardia at rest. SpO2 dropped EOB, stabilized once  returned to supine.) Patient left: in bed;with call bell/phone within reach;with bed alarm set  OT Visit Diagnosis: Unsteadiness on feet (R26.81);Pain;Muscle weakness (generalized) (M62.81)                Time: [id number] OT Time Calculation (min): 22 min Charges:  OT General Charges $OT Visit: 1 Visit OT Evaluation $OT Eval Moderate Complexity: [place], OT Acute Rehabilitation Services Office: [phone]   Hortencia Pilar 01/06/2023, 10:49 AM

## 2023-01-07 DIAGNOSIS — J189 Pneumonia, unspecified organism: Secondary | ICD-10-CM | POA: Diagnosis not present

## 2023-01-07 DIAGNOSIS — J9601 Acute respiratory failure with hypoxia: Secondary | ICD-10-CM | POA: Diagnosis not present

## 2023-01-07 DIAGNOSIS — A419 Sepsis, unspecified organism: Secondary | ICD-10-CM | POA: Diagnosis not present

## 2023-01-07 LAB — BASIC METABOLIC PANEL
Anion gap: 10 (ref 5–15)
BUN: 16 mg/dL (ref 8–23)
CO2: 23 mmol/L (ref 22–32)
Calcium: 8.1 mg/dL — ABNORMAL LOW (ref 8.9–10.3)
Chloride: 107 mmol/L (ref 98–111)
Creatinine, Ser: 0.68 mg/dL (ref 0.44–1.00)
GFR, Estimated: >60 mL/min (ref >60)
Glucose, Bld: 147 mg/dL — ABNORMAL HIGH (ref 70–99)
Potassium: 4.1 mmol/L (ref 3.5–5.1)
Sodium: 140 mmol/L (ref 135–145)

## 2023-01-07 LAB — CBC
HCT: 27.6 % — ABNORMAL LOW (ref 36.0–46.0)
Hemoglobin: 8.9 g/dL — ABNORMAL LOW (ref 12.0–15.0)
MCH: 34.4 pg — ABNORMAL HIGH (ref 26.0–34.0)
MCHC: 32.2 g/dL (ref 30.0–36.0)
MCV: 106.6 fL — ABNORMAL HIGH (ref 80.0–100.0)
Platelets: 381 10*3/uL (ref 150–400)
RBC: 2.59 MIL/uL — ABNORMAL LOW (ref 3.87–5.11)
RDW: 14.3 % (ref 11.5–15.5)
WBC: 11.2 10*3/uL — ABNORMAL HIGH (ref 4.0–10.5)
nRBC: 0.4 % — ABNORMAL HIGH (ref 0.0–0.2)

## 2023-01-07 MED ORDER — BENZONATATE 100 MG PO CAPS
100.0000 mg | ORAL_CAPSULE | Freq: Two times a day (BID) | ORAL | Status: DC | PRN
  Administered 2023-01-07 – 2023-01-11 (×6): 100 mg via ORAL
  Filled 2023-01-07 (×6): qty 1

## 2023-01-07 NOTE — Progress Notes (Signed)
Interval history Feeling somewhat better today.  Still with the wracking cough.  It is productive of sputum, but she is having trouble bringing it up and she feels like it is getting stuck in her throat which is very irritating.  Physical exam Blood pressure 120/88, pulse 99, temperature 97.7 F (36.5 C), temperature source Oral, resp. rate 16, height 4\' 8"  (1.422 m), weight 61 kg, SpO2 97 %.  Paroxysmal coughing Heart rate tachycardic, rhythm irregularly irregular, strong radial pulse Breathing is regular and appears unlabored on 3 L via nasal cannula, some wheezing but decreased from yesterday Skin is warm and dry Alert and oriented to person, place, and event  Net IO Since Admission: 3,150.56 mL [01/07/23 0614]  Labs, images, and other studies Electrolytes and creatinine stable CBC stable Kappa lambda ratio 1.35 within normal limits Blood cultures no growth x 3 days  Assessment and plan Hospital day Clay is a 82 y.o. admitted for sepsis and acute hypoxic respiratory failure due to multifocal pneumonia and likely underlying acute on chronic reactive airway disease.  Principal Problem:   Acute hypoxic respiratory failure (HCC) Active Problems:   Diabetes ([place])   Atrial fibrillation with RVR (HCC)   Macrocytic anemia   Sepsis (HCC)   Pneumonia  Sepsis Multifocal pneumonia, predominantly right middle lobe Acute hypoxic respiratory failure Improved from yesterday.  Stable on 3 L via nasal cannula.  Lung sounds are improved with less wheezing today.  Her cough is productive but she continues to have trouble bringing the sputum out.  Reviewed old imaging, CT chest from February shows signs suggestive of chronic pulmonary disease and some degree of pulmonary hypertension.  Suspect an element of underlying acute on chronic reactive airway disease given the degree of wheezing she has had through this hospital course.  Repeat COVID test tomorrow.  Continue IV  antibiotics, breathing treatments, and supportive care for cough.  No need for steroids today. - IV Unasyn and azithromycin - DuoNebs every 4 hours - Guaifenesin and Tussionex for cough - O2 sats >94% - Follow blood cultures   Chronic atrial fibrillation RVR In setting of acute hypoxic respiratory failure and sepsis due to pneumonia. - Metoprolol 25 mg twice daily - Apixaban 2.5 mg twice daily   Dysphagia  Recent functional decline MBS concerning for esophageal dysphagia.  Dysphagia 3 diet with thin liquids recommended.  Will consider esophagram when she is recovered from her pneumonia. - Dysphagia 3 with thin liquids   Diabetes Last hemoglobin A1c 6.9.  On metformin outpatient. - SSI moderate   Macrocytic anemia Chronic macrocytosis. Worsening anemia over several weeks to months. B12 level within normal limits in February 2024.  Folate within normal limits.  Myeloma labs are trickling back and kappa lambda light chain ratio is reassuringly normal. - Follow-up multiple myeloma panel   Incidental findings on CT chest Include lesions with suspicious lytic appearance and sternum and manubrium in addition to nondisplaced fracture of right-sided manubrium.  Question multiple myeloma versus old injuries from recent falls.  Also with nodular liver suggestive of cirrhosis.  Does not have obvious stigmata of cirrhosis on exam. - Will add these issues to discharge summary for follow-up  Diet: Dysphagia 3 with thin liquids IVF: None VTE: apixaban (ELIQUIS) tablet 2.5 mg Start: 01/06/23 2200  Code: Full PT/OT recommendations: Home health PT/OT Family Update: Daughter, by phone   [name] MD 01/07/2023, 6:14  AM  Pager: [phone] After 5pm or weekend: [phone]

## 2023-01-07 NOTE — Progress Notes (Signed)
Floor tech fed pt with the soup brought by the son. Pt tolerated well however pt had small amount as she said enough. Plan on care ongoing.

## 2023-01-07 NOTE — Progress Notes (Signed)
Gave update to her daughter, Margaree Mackintosh, she requested if pt can have tessalon pearl instead of tussionex since pt is drowsy most of the day and additional stool softener. Will pass on day shift.

## 2023-01-08 ENCOUNTER — Inpatient Hospital Stay (HOSPITAL_COMMUNITY): Payer: Medicare Other

## 2023-01-08 DIAGNOSIS — A419 Sepsis, unspecified organism: Secondary | ICD-10-CM | POA: Diagnosis not present

## 2023-01-08 DIAGNOSIS — J189 Pneumonia, unspecified organism: Secondary | ICD-10-CM | POA: Diagnosis not present

## 2023-01-08 DIAGNOSIS — J9601 Acute respiratory failure with hypoxia: Secondary | ICD-10-CM | POA: Diagnosis not present

## 2023-01-08 LAB — CBC
HCT: 30.1 % — ABNORMAL LOW (ref 36.0–46.0)
Hemoglobin: 9.4 g/dL — ABNORMAL LOW (ref 12.0–15.0)
MCH: 33.8 pg (ref 26.0–34.0)
MCHC: 31.2 g/dL (ref 30.0–36.0)
MCV: 108.3 fL — ABNORMAL HIGH (ref 80.0–100.0)
Platelets: 435 10*3/uL — ABNORMAL HIGH (ref 150–400)
RBC: 2.78 MIL/uL — ABNORMAL LOW (ref 3.87–5.11)
RDW: 14 % (ref 11.5–15.5)
WBC: 13.1 10*3/uL — ABNORMAL HIGH (ref 4.0–10.5)
nRBC: 0.9 % — ABNORMAL HIGH (ref 0.0–0.2)

## 2023-01-08 LAB — BASIC METABOLIC PANEL
Anion gap: 8 (ref 5–15)
BUN: 21 mg/dL (ref 8–23)
CO2: 26 mmol/L (ref 22–32)
Calcium: 8.6 mg/dL — ABNORMAL LOW (ref 8.9–10.3)
Chloride: 107 mmol/L (ref 98–111)
Creatinine, Ser: 0.83 mg/dL (ref 0.44–1.00)
GFR, Estimated: >60 mL/min (ref >60)
Glucose, Bld: 163 mg/dL — ABNORMAL HIGH (ref 70–99)
Potassium: 3.9 mmol/L (ref 3.5–5.1)
Sodium: 141 mmol/L (ref 135–145)

## 2023-01-08 LAB — RESP PANEL BY RT-PCR (RSV, FLU A&B, COVID)  RVPGX2
Influenza A by PCR: NEGATIVE
Influenza B by PCR: NEGATIVE
Resp Syncytial Virus by PCR: NEGATIVE
SARS Coronavirus 2 by RT PCR: NEGATIVE

## 2023-01-08 MED ORDER — PREDNISONE 20 MG PO TABS: 40.0000 mg | ORAL_TABLET | Freq: Every day | ORAL | Status: DC

## 2023-01-08 MED ORDER — IPRATROPIUM BROMIDE 0.02 % IN SOLN
0.5000 mg | Freq: Three times a day (TID) | RESPIRATORY_TRACT | Status: DC
  Administered 2023-01-09: 0.5 mg via RESPIRATORY_TRACT
  Filled 2023-01-08: qty 2.5

## 2023-01-08 MED ORDER — PREDNISONE 20 MG PO TABS
40.0000 mg | ORAL_TABLET | Freq: Every day | ORAL | Status: DC
  Administered 2023-01-09 – 2023-01-11 (×3): 40 mg via ORAL
  Filled 2023-01-08 (×3): qty 2

## 2023-01-08 MED ORDER — IPRATROPIUM BROMIDE 0.02 % IN SOLN
0.5000 mg | RESPIRATORY_TRACT | Status: DC
  Administered 2023-01-08 (×3): 0.5 mg via RESPIRATORY_TRACT
  Filled 2023-01-08 (×3): qty 2.5

## 2023-01-08 NOTE — Plan of Care (Signed)

## 2023-01-08 NOTE — Progress Notes (Signed)
BIPAP not indicated at this time.No distress noted.

## 2023-01-08 NOTE — Progress Notes (Addendum)
HD#4 Subjective:  Overnight Events: none  Patient assessed at bedside this morning using interpreter services. She is having some chest pain with breathing that is about the same as day before.  Her coughing has decreased.  She feels like her heart is beating fast.  She request that her daughter be updated today.  Pt is updated on the plan for today, and all questions and concerns are addressed.   Objective:  Vital signs in last 24 hours: Vitals:   01/07/23 2002 01/07/23 2042 01/07/23 2346 01/08/23 0337  BP: 91/77 112/88 124/83   Pulse: (!) 111 (!) 124    Resp: (!) 27 (!) 23    Temp:  97.7 F (36.5 C) 97.7 F (36.5 C) 98.5 F (36.9 C)  TempSrc:  Oral Oral Oral  SpO2: 100% 100%    Weight:      Height:       Supplemental O2: Nasal Cannula SpO2: 100 % O2 Flow Rate (L/min): 3 L/min FiO2 (%): 30 %   Physical Exam:  Constitutional: Comfortably laying in bed, in no acute distress Cardiovascular: Tachycardic with irregularly irregular rhythm, no m/r/g Pulmonary/Chest: normal work of breathing on nasal cannula lungs, diffuse wheezing appreciated through all lung fields, no rhonchi or crackles Abdominal: soft, non-tender, non-distended MSK: No lower extremity edema Neurological: alert and oriented x 3  Filed Weights   01/04/23 0913 01/04/23 1903  Weight: 68.5 kg 61 kg     Intake/Output Summary (Last 24 hours) at 01/08/2023 0730 Last data filed at 01/08/2023 [id number] Gross per 24 hour  Intake 340 ml  Output 1000 ml  Net -660 ml   Net IO Since Admission: 2,490.56 mL [01/08/23 0730]  Pertinent Labs: Hemoglobin stable at 9.4 White blood cell count increasing from 11.2-13.1 Platelets 435  Imaging: No results found.  Assessment/Plan:   Principal Problem:   Acute hypoxic respiratory failure (HCC) Active Problems:   Diabetes ([place])   Atrial fibrillation with RVR (HCC)   Macrocytic anemia   Sepsis ([place])   Pneumonia   Patient Summary: [name] is a 82 y.o. with a  pertinent PMH of CVA, who presented with shortness of breath and admitted on 3/24 for acute hypoxic respiratory failure on HD#4.    Sepsis Multifocal pneumonia, predominantly right middle lobe Acute hypoxic respiratory failure She reports feeling about the same as yesterday.  She remained stable on 3 L nasal cannula.  She has diffuse wheezing throughout all lung fields.  She likely has some degree of reactive airway disease, will start p.o. steroids as well as scheduled LAMA nebulizer.  Lab evaluation showed leukocytosis worsened from 11-13 this morning.  She received IV steroids 2 days ago, unlikely to be cause of increasing leukocytosis.  Repeat chest x-ray showed worsening  on the right side, this could be 2/2 to viral pneumonia, repeating RVP today. Less concerned for fluid overload, last echo 12/23 with ef 65-70% and would be unusual to have asymmetric edema from heart failure. -Start prednisone 40 mg for 4 days -Continue Unasyn day 5 and azithromycin day 4 -Trend CBC -Ipratropium nebulizer every 4 hours -CXR  Chronic atrial fibrillation RVR Heart rates elevated between 90s to 110s overnight.  Elevated heart rate in setting of pneumonia. -Continue metoprolol 25 mg twice daily -Continue apixaban 2.5 mg twice daily  Dysphagia Recent functional decline Patient with dysphagia since stroke several years ago.  Her daughter is concerned for aspiration pneumonia as she frequently coughs at home after eating.  Yes concerning for esophageal  dysphagia and speech plan for esophagram once she has recovered from pneumonia. -Dysphagia 3 with thin liquids  Diabetes A1c 6.9.  On metformin outpatient -SSI moderate  Macrocytic anemia Baseline hemoglobin between 8-10.  She has chronic macrocytosis.  B12 level within normal limits in February.  Folate within normal limits.  Kappa Lambda light chain normal, immunofixation pending -Follow-up MM panel -CBC  Incidental findings on CT chest Include  lesions with suspicious lytic appearance and sternum and manubrium in addition to nondisplaced fracture of right-sided manubrium.  Question multiple myeloma versus old injuries from recent falls.  Also with nodular liver suggestive of cirrhosis.  Does not have obvious stigmata of cirrhosis on exam. - Will add these issues to discharge summary for follow-up    Diet: Dysphasia 3 with thin liquids IVF: None VTE:  Apixaban, therapeutic Code: Full PT/OT recs: Home Health Family Update: Called and updated by phone, she is on day 4 of quarantining for COVID   Dispo: Anticipated discharge to Home pending improvement in leukocytosis, oxygen requirements, and swallow evaluation.   [name] M. [name], D.O.  Internal Medicine Resident, PGY-2 [facility]  Pager: [phone] 7:30 AM, 01/08/2023   **Please contact the on call pager after 5 pm and on weekends at [phone].**

## 2023-01-08 NOTE — Progress Notes (Signed)
Pharmacy Antibiotic Note  [name] is a 82 y.o. female for which pharmacy has been consulted for ampicillin-sulbactam dosing for  aspiration pna .  Estimated Creatinine Clearance: 38.8 mL/min (by C-G formula based on SCr of 0.83 mg/dL). WBC 13.1; Afebrile; Afib- HR up to 120 overnight; RR 13 COVID neg / flu neg  Plan: Unasyn 1.5g q6h  - day 5; follow-up duration of therapy. Azithromycin day 5 -stopping after today's dose.  Trend WBC, Fever, Renal function F/u cultures, clinical course, WBC De-escalate when able  Height: 4\' 8"  (142.2 cm) Weight: 61 kg (134 lb 7.7 oz) IBW/kg (Calculated) : 36.3  Temp (24hrs), Avg:98.2 F (36.8 C), Min:97.7 F (36.5 C), Max:98.6 F (37 C)  Recent Labs  Lab 01/04/23 0920 01/04/23 0943 01/04/23 1146 01/04/23 1809 01/04/23 2111 01/05/23 0010 01/06/23 0022 01/07/23 0018 01/08/23 0040  WBC 14.1*  --   --   --   --  10.0 13.4* 11.2* 13.1*  CREATININE 0.66  --   --   --   --  0.56 0.78 0.68 0.83  LATICACIDVEN  --  1.8 2.6* 2.4* 1.1  --   --   --   --     Estimated Creatinine Clearance: 38.8 mL/min (by C-G formula based on SCr of 0.83 mg/dL).    Allergies  Allergen Reactions   Bactrim [Sulfamethoxazole-Trimethoprim] Cough    And flushing of face and conjunctivitis.    Microbiology results: Pending  Thank you for allowing pharmacy to be a part of this patient's care.  Sloan Leiter, PharmD, BCPS, BCCCP Clinical Pharmacist Please refer to [facility] for [place] numbers 01/08/2023, 1:07 PM

## 2023-01-08 NOTE — Progress Notes (Signed)
Pt has PRN bipap orders, no distress noted at this time 

## 2023-01-09 ENCOUNTER — Inpatient Hospital Stay (HOSPITAL_COMMUNITY): Payer: Medicare Other

## 2023-01-09 DIAGNOSIS — D539 Nutritional anemia, unspecified: Secondary | ICD-10-CM | POA: Diagnosis not present

## 2023-01-09 DIAGNOSIS — J9601 Acute respiratory failure with hypoxia: Secondary | ICD-10-CM | POA: Diagnosis not present

## 2023-01-09 DIAGNOSIS — J189 Pneumonia, unspecified organism: Secondary | ICD-10-CM | POA: Diagnosis not present

## 2023-01-09 LAB — PROTEIN ELECTROPHORESIS, SERUM
A/G Ratio: 1.1 (ref 0.7–1.7)
Albumin ELP: 3.1 g/dL (ref 2.9–4.4)
Alpha-1-Globulin: 0.4 g/dL (ref 0.0–0.4)
Alpha-2-Globulin: 1 g/dL (ref 0.4–1.0)
Beta Globulin: 0.7 g/dL (ref 0.7–1.3)
Gamma Globulin: 0.6 g/dL (ref 0.4–1.8)
Globulin, Total: 2.8 g/dL (ref 2.2–3.9)
Total Protein ELP: 5.9 g/dL — ABNORMAL LOW (ref 6.0–8.5)

## 2023-01-09 LAB — GLUCOSE, CAPILLARY
Glucose-Capillary: 144 mg/dL — ABNORMAL HIGH (ref 70–99)
Glucose-Capillary: 138 mg/dL — ABNORMAL HIGH (ref 70–99)
Glucose-Capillary: 147 mg/dL — ABNORMAL HIGH (ref 70–99)
Glucose-Capillary: 136 mg/dL — ABNORMAL HIGH (ref 70–99)
Glucose-Capillary: 147 mg/dL — ABNORMAL HIGH (ref 70–99)
Glucose-Capillary: 193 mg/dL — ABNORMAL HIGH (ref 70–99)
Glucose-Capillary: 133 mg/dL — ABNORMAL HIGH (ref 70–99)
Glucose-Capillary: 112 mg/dL — ABNORMAL HIGH (ref 70–99)
Glucose-Capillary: 131 mg/dL — ABNORMAL HIGH (ref 70–99)
Glucose-Capillary: 110 mg/dL — ABNORMAL HIGH (ref 70–99)
Glucose-Capillary: 140 mg/dL — ABNORMAL HIGH (ref 70–99)
Glucose-Capillary: 144 mg/dL — ABNORMAL HIGH (ref 70–99)
Glucose-Capillary: 141 mg/dL — ABNORMAL HIGH (ref 70–99)

## 2023-01-09 LAB — CBC
HCT: 28.2 % — ABNORMAL LOW (ref 36.0–46.0)
Hemoglobin: 9 g/dL — ABNORMAL LOW (ref 12.0–15.0)
MCH: 34 pg (ref 26.0–34.0)
MCHC: 31.9 g/dL (ref 30.0–36.0)
MCV: 106.4 fL — ABNORMAL HIGH (ref 80.0–100.0)
Platelets: 378 10*3/uL (ref 150–400)
RBC: 2.65 MIL/uL — ABNORMAL LOW (ref 3.87–5.11)
RDW: 14.1 % (ref 11.5–15.5)
WBC: 12.3 10*3/uL — ABNORMAL HIGH (ref 4.0–10.5)
nRBC: 0.3 % — ABNORMAL HIGH (ref 0.0–0.2)

## 2023-01-09 LAB — BASIC METABOLIC PANEL
Anion gap: 9 (ref 5–15)
BUN: 12 mg/dL (ref 8–23)
CO2: 25 mmol/L (ref 22–32)
Calcium: 8.2 mg/dL — ABNORMAL LOW (ref 8.9–10.3)
Chloride: 106 mmol/L (ref 98–111)
Creatinine, Ser: 0.53 mg/dL (ref 0.44–1.00)
GFR, Estimated: >60 mL/min (ref >60)
Glucose, Bld: 121 mg/dL — ABNORMAL HIGH (ref 70–99)
Potassium: 3.4 mmol/L — ABNORMAL LOW (ref 3.5–5.1)
Sodium: 140 mmol/L (ref 135–145)

## 2023-01-09 LAB — CULTURE, BLOOD (ROUTINE X 2)
Culture: NO GROWTH
Culture: NO GROWTH
Special Requests: ADEQUATE

## 2023-01-09 LAB — BLOOD GAS, VENOUS
Acid-Base Excess: 6.3 mmol/L — ABNORMAL HIGH (ref 0.0–2.0)
Bicarbonate: 33.1 mmol/L — ABNORMAL HIGH (ref 20.0–28.0)
O2 Saturation: 91.7 %
Patient temperature: 36.4
pCO2, Ven: 55 mmHg (ref 44–60)
pH, Ven: 7.39 (ref 7.25–7.43)
pO2, Ven: 59 mmHg — ABNORMAL HIGH (ref 32–45)

## 2023-01-09 LAB — PROCALCITONIN: Procalcitonin: <0.1 ng/mL

## 2023-01-09 LAB — MAGNESIUM: Magnesium: 1.7 mg/dL (ref 1.7–2.4)

## 2023-01-09 MED ORDER — ACETAMINOPHEN 325 MG PO TABS
650.0000 mg | ORAL_TABLET | Freq: Four times a day (QID) | ORAL | Status: DC | PRN
  Administered 2023-01-09: 650 mg via ORAL
  Filled 2023-01-09: qty 2

## 2023-01-09 MED ORDER — SODIUM CHLORIDE 3 % IN NEBU
4.0000 mL | INHALATION_SOLUTION | Freq: Two times a day (BID) | RESPIRATORY_TRACT | Status: AC
  Administered 2023-01-09 – 2023-01-12 (×6): 4 mL via RESPIRATORY_TRACT
  Filled 2023-01-09 (×6): qty 4

## 2023-01-09 MED ORDER — IPRATROPIUM-ALBUTEROL 0.5-2.5 (3) MG/3ML IN SOLN
3.0000 mL | Freq: Four times a day (QID) | RESPIRATORY_TRACT | Status: DC
  Administered 2023-01-09 – 2023-01-10 (×5): 3 mL via RESPIRATORY_TRACT
  Filled 2023-01-09 (×5): qty 3

## 2023-01-09 MED ORDER — POTASSIUM CHLORIDE 20 MEQ PO PACK
40.0000 meq | PACK | Freq: Two times a day (BID) | ORAL | Status: AC
  Administered 2023-01-09: 40 meq via ORAL
  Filled 2023-01-09: qty 2

## 2023-01-09 NOTE — Progress Notes (Signed)
Subjective: Our team went to reassess the patient. Upon arrival, patient was resting in bed comfortably.  Patient reports that she still feels tired as she did this morning.  Reports that her cough is unchanged from this morning and productive of clear sputum.  She is requesting an additional dose of medication to help with her cough/sputum which is causing her discomfort.  She reports minimal SOB that is unchanged throughout today.  Patient reports that she is using her incentive spirometer frequently.  Son was at bedside.  All questions were answered.   Objective: Constitutional: resting in bed, appears comfortable, intermittently coughing Cardiovascular: Regular rate, regular rhythm. No murmurs, rubs, or gallops.  Pulmonary: Normal respiratory effort on 2L [place]. Diffuse crackles in all lung fields. Trace wheeze in all lung fields.  Neurological: Alert and oriented to person, place, and time. Non-focal. Skin: warm and dry.    Plan: Patient satting > 95% on 2L . Appears comfortable overall. Respiratory status appears unchanged from this morning. Updated patient and family on pulmonology plan for additional testing. Discussed continuing antibiotics, steroids, breathing treatments, and supportive treatments.  -Appreciate pulmonology recommendations -Continue IV unasyn and azithromycin -Continue guaifenesin/benzonatate for cough -Continue hypertonic saline nebs -Continue duonebs -Continue prednisone

## 2023-01-09 NOTE — Progress Notes (Signed)
Pt daughter request call from MD about pt status. RN paged ITMS MD. MD returned call and advised would make call to daughter

## 2023-01-09 NOTE — Progress Notes (Signed)
Speech Language Pathology Treatment: Dysphagia  Patient Details Name: [name] MRN: [id number] DOB: 08-03-1941 Today's Date: 01/09/2023 Time: [id number] SLP Time Calculation (min) (ACUTE ONLY): 25 min  Assessment / Plan / Recommendation Clinical Impression  Pt was seen for dysphagia treatment. [employer] [place]e interpreters, [name] (ID# [phone] and [phone]), were used for translation. Pt reported being "very sleepy", but that she has been tolerating meals well. Pt was seen with part of breakfast and she tolerated [place] sausage, a croissant, and thin liquids via straw without overt s/s of aspiration. Mastication was notably prolonged with sausages, but pt attributed this to her being tired and her reduced dentition. Oral clearance was [facility]. RN arrived and administer meds and pt tolerated six whole tablets (given individually) with thin liquids via straw without overt s/s of aspiration. Eructation was noted throughout the session. The recommendation for esophageal assessment (e.g., esophagram) was discussed with Dr. Carin Primrose on 3/22; agreement was verbalized, but notes indicate that this will be considered when pt is recovered from her pneumonia. A dysphagia 3 diet with thin liquids is recommended with observance of GERD precautions. Further acute skilled SLP services are not clinically indicated at this time and pt verbalized agreement with POC.   HPI HPI: Pt is an 82 year old Vietnamese-speaking female who presented secondary to breathing difficulties. Pt reportedly had RSV 2 weeks prior to admission and had been improving in the outpatient setting until she started having worsening dyspnea on exertion. CT chest 3/20: Interval development of scattered bilateral airspace opacities, most conspicuous within the right middle lobe, nonspecific though worrisome for multifocal infection. Pt was scheduled to have an outpatient modified barium swallow study 3/21. PMH: GERD, Subdural hematoma,  PNA, DM, frequent falls, Afib. MBS 01/13/17: oropharyngeal swallow is within gross functional limits given age. A regular texture diet with thin liquids recommended at that time with request to consider esophageal assessment to rule out risk for post-prandial aspiration.      SLP Plan  All goals met;Discharge SLP treatment due to (comment)      Recommendations for follow up therapy are one component of a multi-disciplinary discharge planning process, led by the attending physician.  Recommendations may be updated based on patient status, additional functional criteria and insurance authorization.    Recommendations  Diet recommendations: Dysphagia 3 (mechanical soft);Thin liquid Liquids provided via: Cup;Straw Medication Administration: Whole meds with liquid Supervision: Patient able to self feed Compensations: Slow rate;Small sips/bites Postural Changes and/or Swallow Maneuvers: Seated upright 90 degrees;Upright 30-60 min after meal                  Oral care BID   None Dysphagia, unspecified (R13.10)     All goals met;Discharge SLP treatment due to (comment)   [name] I. [name], [name], [place] Office number [phone]  [name]  01/09/2023, 10:56 AM

## 2023-01-09 NOTE — Consult Note (Addendum)
NAMEJamilet [name], MRN:  [id number], DOB:  [birth year], LOS: 5 ADMISSION DATE:  01/04/2023, CONSULTATION DATE:  01/09/23 REFERRING MD:  hoffman, CHIEF COMPLAINT:  CP, cough   History of Present Illness:  82yF with history of CVA, AF, dysphagia wwith recent hospitalization for RSV/acute hypoxic respiratory failure with some functional decline since with 2 falls requiring ED visits. She presented to ED 3/20 with persistent cough, fever to 103, confusion.   She was found to have multifocal pneumonia and was started on unasyn eventually - though stable O2 requirement, primary team concerned about treatment failure due to her worsening acute metabolic encephalopathy and worsening radiographic extent on CXR.   Pertinent  Medical History  CVA AF Dysphagia Recent hospitalization for RSV  Significant Hospital Events: Including procedures, antibiotic start and stop dates in addition to other pertinent events   3/20 admitted and started on unasyn and azithromcyin. Stable 2L O2 requirement throughout.  Interim History / Subjective:   Family informs me that the patient wishes to go home with hospice today.   They are not interested in obtaining CT Chest or any further procedures.  Objective   Blood pressure 120/73, pulse 90, temperature 99.3 F (37.4 C), temperature source Axillary, resp. rate 14, height 4\' 8"  (1.422 m), weight 61 kg, SpO2 100 %.        Intake/Output Summary (Last 24 hours) at 01/09/2023 1352 Last data filed at 01/09/2023 0645 Gross per 24 hour  Intake 1060 ml  Output 900 ml  Net 160 ml   Filed Weights   01/04/23 0913 01/04/23 1903  Weight: 68.5 kg 61 kg    Examination: General appearance: 82 y.o., female, drowsy but arousable Eyes: PERRL, tracking appropriately HENT: NCAT; MMM Lungs: diminished bl, with normal respiratory effort CV: IRIR no murmur  Abdomen: Soft, non-tender; non-distended, BS present  Extremities: No peripheral edema, warm Skin: Normal turgor and  texture; no rash Neuro: grossly nonfocal   Resolved Hospital Problem list    Assessment & Plan:   # Multifocal pneumonia Worsening radiographic extent while on course of unasyn, azithro. Recurrent aspiration and weak cough (favored) vs secondary OP related to prior RSV infection vs resistant or atypical organism vs delayed clinical improvement in setting possible MM.  - sputum cx, AFB cx (ordered sputum induction though not sure she'll be able to do it), urine legionella, CT Chest  - with low procalcitonin would not escalate ABX - bronchodilators, continue guaifenesin, start HTS nebs, metaneb - continue unasyn - esophagram when more participatory - would hold eliquis for now in case needs bronch early this week - ok to continue steroids - hopefully CT will give [place] some sense regarding whether this looks more suspicious for OP pattern than recurrent aspiration but they could certainly be hard to distinguish radiographically. Otherwise doesn't meet inclusion criteria for steroids for severe pneumonia as in CAPE COD trial and not wheezy on my exam.   # Acute metabolic encephalopathy TME from sepsis from multifocal pneumonia vs medication effect from tussionex vs hospital acquired delirium - would check UA, TSH. vbg  Will follow      Best Practice (right click and "Reselect all SmartList Selections" daily)   Per primary  Labs   CBC: Recent Labs  Lab 01/04/23 0920 01/04/23 1724 01/05/23 0010 01/06/23 0022 01/07/23 0018 01/08/23 0040 01/09/23 0810  WBC 14.1*  --  10.0 13.4* 11.2* 13.1* 12.3*  NEUTROABS 4.9  --   --   --   --   --   --  HGB 9.8*   < > 8.6* 9.2* 8.9* 9.4* 9.0*  HCT 30.9*   < > 27.4* 29.0* 27.6* 30.1* 28.2*  MCV 106.2*  --  106.2* 106.6* 106.6* 108.3* 106.4*  PLT 409*  --  401* 399 381 435* 378   < > = values in this interval not displayed.    Basic Metabolic Panel: Recent Labs  Lab 01/04/23 0920 01/04/23 1724 01/05/23 0010 01/06/23 0022  01/07/23 0018 01/08/23 0040 01/09/23 0810  NA 137   < > 139 139 140 141 140  K 3.7   < > 4.1 3.9 4.1 3.9 3.4*  CL 102  --  106 105 107 107 106  CO2 24  --  24 25 23 26 25   GLUCOSE 152*  --  156* 129* 147* 163* 121*  BUN 10  --  7* 12 16 21 12   CREATININE 0.66  --  0.56 0.78 0.68 0.83 0.53  CALCIUM 8.8*  --  8.1* 8.4* 8.1* 8.6* 8.2*  MG 1.8  --   --   --   --   --   --    < > = values in this interval not displayed.   GFR: Estimated Creatinine Clearance: 40.2 mL/min (by C-G formula based on SCr of 0.53 mg/dL). Recent Labs  Lab 01/04/23 0943 01/04/23 1146 01/04/23 1809 01/04/23 2111 01/05/23 0010 01/06/23 0022 01/07/23 0018 01/08/23 0040 01/09/23 0810  WBC  --   --   --   --    < > 13.4* 11.2* 13.1* 12.3*  LATICACIDVEN 1.8 2.6* 2.4* 1.1  --   --   --   --   --    < > = values in this interval not displayed.    Liver Function Tests: Recent Labs  Lab 01/04/23 0920  AST 18  ALT 9  ALKPHOS 62  BILITOT 1.0  PROT 6.4*  ALBUMIN 3.5   Recent Labs  Lab 01/04/23 0920  LIPASE 27   No results for input(s): "AMMONIA" in the last 168 hours.  ABG    Component Value Date/Time   PHART 7.331 (L) 01/04/2023 1724   PCO2ART 45.1 01/04/2023 1724   PO2ART 95 01/04/2023 1724   HCO3 24.0 01/04/2023 1724   TCO2 25 01/04/2023 1724   ACIDBASEDEF 2.0 01/04/2023 1724   O2SAT 97 01/04/2023 1724     Coagulation Profile: No results for input(s): "INR", "PROTIME" in the last 168 hours.  Cardiac Enzymes: No results for input(s): "CKTOTAL", "CKMB", "CKMBINDEX", "TROPONINI" in the last 168 hours.  HbA1C: Hgb A1c MFr Bld  Date/Time Value Ref Range Status  10/14/2022 05:34 AM 6.9 (H) 4.8 - 5.6 % Final    Comment:    (NOTE)         Prediabetes: 5.7 - 6.4         Diabetes: >6.4         Glycemic control for adults with diabetes: <7.0   10/18/2020 06:38 AM 6.4 (H) 4.8 - 5.6 % Final    Comment:    (NOTE) Pre diabetes:          5.7%-6.4%  Diabetes:              >6.4%  Glycemic  control for   <7.0% adults with diabetes     CBG: Recent Labs  Lab 01/08/23 1146 01/08/23 1639 01/08/23 2102 01/09/23 0614 01/09/23 1104  GLUCAP 131* 110* 140* 144* 141*    Review of Systems:   12 point review of systems is  negative except as in HPI  Past Medical History:  She,  has a past medical history of Atrial fibrillation ([place]), Chronic back pain, Diabetes mellitus without complication ([place]), Frequent falls, GERD (gastroesophageal reflux disease), Hypertriglyceridemia, OSA on CPAP, Osteoarthritis, Pneumonia (~ 2000 X 1), Subdural hematoma ([place]) (july 2015), T12 compression fracture ([place]) (2012), and Type II diabetes mellitus ([place]).   Surgical History:   Past Surgical History:  Procedure Laterality Date   APPENDECTOMY  2012   CATARACT EXTRACTION W/ INTRAOCULAR LENS  IMPLANT, BILATERAL Bilateral 2000's   IR GENERIC HISTORICAL  10/15/2016   IR PERCUTANEOUS ART THROMBECTOMY/INFUSION INTRACRANIAL INC DIAG ANGIO 10/15/2016 [name], MD [facility]   IR GENERIC HISTORICAL  12/13/2016   IR RADIOLOGIST EVAL & MGMT 12/13/2016 [facility]   RADIOLOGY WITH ANESTHESIA N/A 10/15/2016   Procedure: RADIOLOGY WITH ANESTHESIA;  Surgeon: [name], MD;  Location: [facility];  Service: Radiology;  Laterality: N/A;   TOTAL ABDOMINAL HYSTERECTOMY  1990     Social History:   reports that she has never smoked. She has been exposed to tobacco smoke. She has never used smokeless tobacco. She reports that she does not drink alcohol and does not use drugs.   Family History:  Her family history is negative for Diabetes Mellitus II and Hypertension.   Allergies Allergies  Allergen Reactions   Bactrim [Sulfamethoxazole-Trimethoprim] Cough    And flushing of face and conjunctivitis.      Home Medications  Prior to Admission medications   Medication Sig Start Date End Date Taking? Authorizing Provider  acetaminophen (TYLENOL) 325 MG tablet Take 2 tablets (650 mg total) by  mouth every 6 (six) hours as needed for mild pain, headache or fever. Patient taking differently: Take 1,000 mg by mouth every 6 (six) hours as needed for mild pain, headache or fever. 11/09/20  Yes Angiulli, Lavon Paganini, PA-C  apixaban (ELIQUIS) 5 MG TABS tablet Take 1 tablet (5 mg total) by mouth 2 (two) times daily. 11/09/20  Yes Angiulli, Lavon Paganini, PA-C  augmented betamethasone dipropionate (DIPROLENE-AF) 0.05 % ointment Apply 1 Application topically 2 (two) times daily as needed (rash on legs). 01/04/22  Yes [provider]  Calcium Carbonate (CALCIUM-CARB 600 PO) Take 1 tablet by mouth daily. Gummy   Yes [provider]  cetirizine (ZYRTEC) 10 MG tablet Take 10 mg daily as needed by mouth (seasonal allergies).   Yes [provider]  chlorpheniramine-HYDROcodone (TUSSIONEX) 10-8 MG/5ML Take 5 mLs by mouth 2 (two) times daily as needed for cough. 10/17/22  Yes [provider]  Cholecalciferol (VITAMIN D) 50 MCG (2000 UT) tablet Take 1 tablet (2,000 Units total) by mouth daily. 11/09/20  Yes Angiulli, Lavon Paganini, PA-C  DULoxetine (CYMBALTA) 30 MG capsule Take 30 mg by mouth at bedtime. 10/14/21  Yes [provider]  fluticasone (FLONASE) 50 MCG/ACT nasal spray Place 1 spray daily as needed into both nostrils (seasonal allergies).  02/18/14  Yes [provider]  FORTEO 600 MCG/2.4ML SOPN Inject 20 mcg into the skin at bedtime. 12/10/21  Yes [place] Eric J, DO  guaiFENesin (MUCINEX) 600 MG 12 hr tablet Take 1 tablet (600 mg total) by mouth 2 (two) times daily as needed. Patient taking differently: Take 600 mg by mouth 2 (two) times daily as needed for cough or to loosen phlegm. 10/17/22  Yes Thurnell Lose, MD  HYDROcodone-acetaminophen (NORCO/VICODIN) 5-325 MG tablet Take 1 tablet by mouth every 6 (six) hours as needed for moderate pain. 12/14/20  Yes [provider]  hydrocortisone 2.5 % cream Apply 1 application topically daily as needed (itching).  06/17/21  Yes [provider]  levalbuterol (XOPENEX) 0.63 MG/3ML nebulizer solution Take 3 mLs (0.63 mg total) by nebulization every 8 (eight) hours as needed for wheezing or shortness of breath. 10/17/22  Yes Thurnell Lose, MD  lidocaine (LIDODERM) 5 % Place 2 patches onto the skin daily. Remove & Discard patch within 12 hours or as directed by MD Patient taking differently: Place 2 patches onto the skin daily as needed (back pain). Remove & Discard patch within 12 hours or as directed by MD 11/09/20  Yes Angiulli, Lavon Paganini, PA-C  metFORMIN (GLUCOPHAGE) 500 MG tablet Take 0.5 tablets (250 mg total) by mouth 2 (two) times daily with a meal. Patient taking differently: Take 500 mg by mouth 2 (two) times daily with a meal. 11/09/20  Yes Angiulli, Lavon Paganini, PA-C  metoprolol tartrate (LOPRESSOR) 50 MG tablet Take 1 tablet (50 mg total) by mouth 2 (two) times daily. Patient taking differently: Take 25 mg by mouth 2 (two) times daily. 10/17/22  Yes Thurnell Lose, MD  mometasone (NASONEX) 50 MCG/ACT nasal spray Place 2 sprays into the nose daily. Patient taking differently: Place 2 sprays into the nose daily as needed (Rhinitis). 10/17/22 10/17/23 Yes Thurnell Lose, MD  nitroGLYCERIN (NITROSTAT) 0.4 MG SL tablet Place 1 tablet (0.4 mg total) under the tongue every 5 (five) minutes as needed for chest pain. 11/09/20  Yes Angiulli, Lavon Paganini, PA-C  pantoprazole (PROTONIX) 40 MG tablet Take 1 tablet (40 mg total) by mouth daily. Patient taking differently: Take 40 mg by mouth daily as needed (Indigestion). 10/18/22  Yes Thurnell Lose, MD  pravastatin (PRAVACHOL) 20 MG tablet Take 1 tablet (20 mg total) by mouth at bedtime. 11/09/20  Yes Angiulli, Lavon Paganini, PA-C  senna-docusate (SENOKOT-S) 8.6-50 MG tablet Take 1 tablet by mouth at bedtime as needed for mild constipation.   Yes [provider]  traMADol (ULTRAM) 50 MG tablet Take 1 tablet (50 mg total) by mouth every 6 (six) hours as needed  for moderate pain. Patient taking differently: Take 50 mg by mouth every 8 (eight) hours as needed for moderate pain. 11/09/20  Yes Angiulli, Lavon Paganini, PA-C  vitamin k 100 MCG tablet Take 100 mcg by mouth daily.   Yes [provider]  ipratropium (ATROVENT) 0.02 % nebulizer solution Take 2.5 mLs (0.5 mg total) by nebulization every 6 (six) hours. Patient not taking: Reported on 01/05/2023 10/17/22   Thurnell Lose, MD  methylPREDNISolone (MEDROL DOSEPAK) 4 MG TBPK tablet follow package directions Patient not taking: Reported on 01/05/2023 10/17/22   Thurnell Lose, MD     Critical care time: na

## 2023-01-09 NOTE — Care Management Important Message (Signed)
Important Message  Patient Details  Name: [name] MRN: [id number] Date of Birth: 05/02/1941   Medicare Important Message Given:  Yes     Deo Mehringer Montine Circle 01/09/2023, 3:16 PM

## 2023-01-09 NOTE — Progress Notes (Signed)
                 Interval history Still with some chest pain, exacerbated by coughing.  Not worse with walking or transitioning from bed to chair.  Feeling very tired today.  Physical exam Blood pressure (!) 147/99, pulse 99, temperature 97.6 F (36.4 C), temperature source Oral, resp. rate 18, height 4\' 8"  (1.422 m), weight 61 kg, SpO2 98 %.  Uncomfortable appearing Heart rate tachycardic, rhythm are regular, radial pulses are strong, bilateral lower extremities are cool with delayed capillary refill, no lower extremity edema, no appreciable JVD Breathing is regular and somewhat labored on 2 L via nasal cannula, diffuse crackles throughout all lung fields, scant expiratory wheeze Abdomen is soft and nontender Skin is warm and dry Alert to verbal, answers questions appropriately  Labs, images, and other studies Potassium 3.4 Bicarb 25 Creatinine 0.53 WBC 12.3 Hemoglobin 9 Blood cultures no growth x 5 days  Assessment and plan Hospital day [place] is a 82 y.o. admitted for sepsis and acute hypoxic respiratory failure secondary to multifocal pneumonia.  Principal Problem:   Acute hypoxic respiratory failure (HCC) Active Problems:   Diabetes ([place])   Atrial fibrillation with RVR (HCC)   Macrocytic anemia   Sepsis (HCC)   Pneumonia  Acute hypoxic respiratory failure Sepsis Multifocal pneumonia, right middle lobe predominance She remained stable on 2 L via nasal cannula, her breathing is nonlabored.  She has diffuse crackles throughout all lung fields.  Wheezing is decreased.  Although her respiratory status is stable, mental status worse today.  Repeat COVID, flu, RSV testing is negative.  Overall she is not improving despite IV antibiotics for CAP/aspiration pneumonia and atypical coverage.  Wonder if she has something that were not covering with the current antimicrobial regimen.  Will try to get a sputum sample.  Do not suspect ARDS as she is oxygenating well on 2 L of O2.   As mentioned previously, suspect a significant component of underlying chronic respiratory disease like COPD or asthma given the wheezing throughout her hospitalization and prior imaging with ground-glass attenuation in the lungs typically seen with small airways disease such as asthma or constrictive bronchiolitis. Other possibilities would cryptogenic organizing pneumonia and hypersensitivity pneumonitis (from 12/04/22 CT chest).  Appreciate pulmonology assistance with this case. - IV ampicillin-sulbactam - DuoNebs 4 times daily - Guaifenesin twice daily - Prednisone 40 mg p.o. daily - Benzonatate as needed for cough  Chronic A-fib RVR Stable with rates 90s to 110s in setting of pneumonia - Metoprolol 25 mg twice daily - Apixaban 2.5 mg twice daily  Dysphagia Recent functional decline Stroke several years ago.  Has declined more precipitously in the last 3 to 6 months.  Consider esophagram after she has recovered from pneumonia. - Dysphagia 3 with thin liquids  Diabetes A1c 6.9.  Metformin outpatient. - SSI moderate  Macrocytic anemia B12 and folate within normal limits.  Macrocytosis makes iron deficiency anemia less likely.  Kappa lambda ratio within normal limits.  Rest of myeloma panel pending.  Diet: Dysphagia 3 thin liquids IVF: None VTE: apixaban (ELIQUIS) tablet 2.5 mg Start: 01/06/23 2200  Code: Full PT/OT recommendations: Home health PT/OT   [name] MD 01/09/2023, 6:54 AM  Pager: [phone] After 5pm or weekend: [phone]

## 2023-01-09 NOTE — Progress Notes (Signed)
Occupational Therapy Treatment Patient Details Name: [name] MRN: [id number] DOB: 17-Mar-1941 Today's Date: 01/09/2023   History of present illness 82 year old [place] female, presents to ED via EMS 3/20. Pt tachycardic with intermittent desaturations in high 80s on RA. Chest CT suggestive of PNA. Admitted for presumed sepsis due to PNA PMH Severe case if RSV leading to hospitalization 1/24, repeated falls, 2 requiring visits to the ED. DM2, A-fib, acute cerebral infarct, 2017.   OT comments  Pt very lethargic this session opening eyes minimally to touch and cold/wet washcloth on her face. Pt needing max A at this time for ADLs, max A +2 for bed mobility and mod A +2 for pivot transfer from chair > bed. Pt responding to <25% of questions asked, able to state her name, interpreter to identify what pt was saying at times during session. Pt presenting with impairments listed below, will follow acutely. Pt would benefit from postacute rehab (<3 hrs/day) to maximize safety and independence with ADLs/functional mobility prior to d/c home.   Recommendations for follow up therapy are one component of a multi-disciplinary discharge planning process, led by the attending physician.  Recommendations may be updated based on patient status, additional functional criteria and insurance authorization.    Assistance Recommended at Discharge Frequent or constant Supervision/Assistance  Patient can return home with the following  A lot of help with walking and/or transfers;A lot of help with bathing/dressing/bathroom;Assistance with cooking/housework;Assistance with feeding;Direct supervision/assist for medications management;Assist for transportation;Direct supervision/assist for financial management;Help with stairs or ramp for entrance   Equipment Recommendations  Other (comment) (defer)    Recommendations for Other Services      Precautions / Restrictions Precautions Precautions: Fall Precaution  Comments: fallen 2x in last week; covid exposure this week, airborne/contact precs Restrictions Weight Bearing Restrictions: No       Mobility Bed Mobility Overal bed mobility: Needs Assistance         Sit to supine: Max assist, +2 for physical assistance   General bed mobility comments: assist for trunk and BLE mgmt    Transfers Overall transfer level: Needs assistance Equipment used: Rolling walker (2 wheels) Transfers: Sit to/from Stand, Bed to chair/wheelchair/BSC Sit to Stand: Mod assist, +2 physical assistance           General transfer comment: mod A +2 for sequencing due to fatigue and pt not opening eyes     Balance Overall balance assessment: Needs assistance Sitting-balance support: Feet supported, No upper extremity supported Sitting balance-Leahy Scale: Poor Sitting balance - Comments: L lateral lean Postural control: Left lateral lean, Posterior lean   Standing balance-Leahy Scale: Poor Standing balance comment: requires UE support and external assist with gait belt for maintaining balance                           ADL either performed or assessed with clinical judgement   ADL Overall ADL's : Needs assistance/impaired     Grooming: Maximal assistance;Sitting Grooming Details (indicate cue type and reason): washing face seated up in chair                 Toilet Transfer: +2 for physical assistance;Moderate assistance Toilet Transfer Details (indicate cue type and reason): stepping to bed on pt's L side Toileting- Clothing Manipulation and Hygiene: Maximal assistance       Functional mobility during ADLs: Moderate assistance;+2 for physical assistance      Extremity/Trunk Assessment Upper Extremity Assessment Upper Extremity Assessment: Generalized  weakness;Difficult to assess due to impaired cognition (due to lethargy, pt unable to follow command lift BUE against gravity, PROM WFL)   Lower Extremity Assessment Lower  Extremity Assessment: Defer to PT evaluation        Vision   Additional Comments: keeps eyes closed, will further assess   Perception Perception Perception: Not tested   Praxis Praxis Praxis: Not tested    Cognition Arousal/Alertness: Lethargic Behavior During Therapy: Flat affect Overall Cognitive Status: Difficult to assess                                 General Comments: keeping eyes closed, max cues to remain alert/participate, interpreter having difficutly hearing pt's reponses        Exercises Other Exercises Other Exercises: BUE shoulder/elbow PROM    Shoulder Instructions       General Comments VSS on 2L O2 during session; interpreter utilized, Venida Jarvis [id number]    Pertinent Vitals/ Pain       Pain Assessment Pain Assessment: Faces Pain Location: pt unable to state  Home Living                                          Prior Functioning/Environment              Frequency  Min 2X/week        Progress Toward Goals  OT Goals(current goals can now be found in the care plan section)  Progress towards OT goals: Progressing toward goals  Acute Rehab OT Goals Patient Stated Goal: pt unable to state OT Goal Formulation: With patient Time For Goal Achievement: 01/20/23 Potential to Achieve Goals: Fair ADL Goals Pt Will Perform Grooming: sitting;with supervision;with set-up Pt Will Perform Upper Body Dressing: with set-up;sitting Pt Will Perform Lower Body Dressing: with min guard assist;sit to/from stand Pt Will Transfer to Toilet: with min guard assist;ambulating;bedside commode Pt Will Perform Toileting - Clothing Manipulation and hygiene: with min assist;sit to/from stand;with min guard assist;sitting/lateral leans Additional ADL Goal #1: Pt will complete bed mobility at min guard level to prepare for EOB/OOB ADLs.  Plan Discharge plan needs to be updated;Frequency remains appropriate    Co-evaluation                  AM-PAC OT "6 Clicks" Daily Activity     Outcome Measure   Help from another person eating meals?: A Lot Help from another person taking care of personal grooming?: A Lot Help from another person toileting, which includes using toliet, bedpan, or urinal?: A Lot Help from another person bathing (including washing, rinsing, drying)?: A Lot Help from another person to put on and taking off regular upper body clothing?: A Lot Help from another person to put on and taking off regular lower body clothing?: A Lot 6 Click Score: 12    End of Session Equipment Utilized During Treatment: Gait belt;Oxygen (2L)  OT Visit Diagnosis: Unsteadiness on feet (R26.81);Pain;Muscle weakness (generalized) (M62.81)   Activity Tolerance Patient limited by fatigue;Patient limited by lethargy   Patient Left in bed;with call bell/phone within reach;with bed alarm set   Nurse Communication Mobility status        Time: [id number] OT Time Calculation (min): 22 min  Charges: OT General Charges $OT Visit: 1 Visit OT Treatments $Therapeutic Activity: 8-22 mins  Renaye Rakers,  OTD, OTR/L SecureChat Preferred Acute Rehab [phone]   Lyne Khurana K Koonce 01/09/2023, 3:00 PM

## 2023-01-10 ENCOUNTER — Telehealth: Payer: Self-pay | Admitting: Student

## 2023-01-10 ENCOUNTER — Inpatient Hospital Stay (HOSPITAL_COMMUNITY): Payer: Medicare Other

## 2023-01-10 DIAGNOSIS — J9601 Acute respiratory failure with hypoxia: Secondary | ICD-10-CM | POA: Diagnosis not present

## 2023-01-10 DIAGNOSIS — J8489 Other specified interstitial pulmonary diseases: Secondary | ICD-10-CM

## 2023-01-10 DIAGNOSIS — J189 Pneumonia, unspecified organism: Secondary | ICD-10-CM | POA: Diagnosis not present

## 2023-01-10 DIAGNOSIS — D539 Nutritional anemia, unspecified: Secondary | ICD-10-CM | POA: Diagnosis not present

## 2023-01-10 LAB — URINALYSIS, COMPLETE (UACMP) WITH MICROSCOPIC
Bacteria, UA: NONE SEEN
Bilirubin Urine: NEGATIVE
Glucose, UA: NEGATIVE mg/dL
Hgb urine dipstick: NEGATIVE
Ketones, ur: 5 mg/dL — AB
Leukocytes,Ua: NEGATIVE
Nitrite: NEGATIVE
Protein, ur: NEGATIVE mg/dL
Specific Gravity, Urine: 1.017 (ref 1.005–1.030)
pH: 5 (ref 5.0–8.0)

## 2023-01-10 LAB — GLUCOSE, CAPILLARY
Glucose-Capillary: 142 mg/dL — ABNORMAL HIGH (ref 70–99)
Glucose-Capillary: 119 mg/dL — ABNORMAL HIGH (ref 70–99)
Glucose-Capillary: 102 mg/dL — ABNORMAL HIGH (ref 70–99)
Glucose-Capillary: 97 mg/dL (ref 70–99)
Glucose-Capillary: 158 mg/dL — ABNORMAL HIGH (ref 70–99)

## 2023-01-10 LAB — CBC
HCT: 27.7 % — ABNORMAL LOW (ref 36.0–46.0)
Hemoglobin: 9 g/dL — ABNORMAL LOW (ref 12.0–15.0)
MCH: 34.5 pg — ABNORMAL HIGH (ref 26.0–34.0)
MCHC: 32.5 g/dL (ref 30.0–36.0)
MCV: 106.1 fL — ABNORMAL HIGH (ref 80.0–100.0)
Platelets: 352 10*3/uL (ref 150–400)
RBC: 2.61 MIL/uL — ABNORMAL LOW (ref 3.87–5.11)
RDW: 13.9 % (ref 11.5–15.5)
WBC: 11.7 10*3/uL — ABNORMAL HIGH (ref 4.0–10.5)
nRBC: 0.5 % — ABNORMAL HIGH (ref 0.0–0.2)

## 2023-01-10 LAB — IMMUNOFIXATION ELECTROPHORESIS
IgA: 61 mg/dL — ABNORMAL LOW (ref 64–422)
IgG (Immunoglobin G), Serum: 561 mg/dL — ABNORMAL LOW (ref 586–1602)
IgM (Immunoglobulin M), Srm: 76 mg/dL (ref 26–217)
Total Protein ELP: 6.1 g/dL (ref 6.0–8.5)

## 2023-01-10 LAB — BASIC METABOLIC PANEL
Anion gap: 7 (ref 5–15)
BUN: 14 mg/dL (ref 8–23)
CO2: 27 mmol/L (ref 22–32)
Calcium: 8.5 mg/dL — ABNORMAL LOW (ref 8.9–10.3)
Chloride: 105 mmol/L (ref 98–111)
Creatinine, Ser: 0.65 mg/dL (ref 0.44–1.00)
GFR, Estimated: >60 mL/min (ref >60)
Glucose, Bld: 129 mg/dL — ABNORMAL HIGH (ref 70–99)
Potassium: 4.5 mmol/L (ref 3.5–5.1)
Sodium: 139 mmol/L (ref 135–145)

## 2023-01-10 LAB — TSH: TSH: 0.179 u[IU]/mL — ABNORMAL LOW (ref 0.350–4.500)

## 2023-01-10 MED ORDER — IPRATROPIUM-ALBUTEROL 0.5-2.5 (3) MG/3ML IN SOLN
3.0000 mL | RESPIRATORY_TRACT | Status: DC | PRN
  Administered 2023-01-11 – 2023-01-12 (×3): 3 mL via RESPIRATORY_TRACT
  Filled 2023-01-10 (×4): qty 3

## 2023-01-10 MED ORDER — HEPARIN SODIUM (PORCINE) 5000 UNIT/ML IJ SOLN
5000.0000 [IU] | Freq: Three times a day (TID) | INTRAMUSCULAR | Status: DC
  Administered 2023-01-10 – 2023-01-11 (×3): 5000 [IU] via SUBCUTANEOUS
  Filled 2023-01-10 (×3): qty 1

## 2023-01-10 NOTE — Progress Notes (Signed)
Placed patient on CPAP for the night via auto-mode.  

## 2023-01-10 NOTE — Telephone Encounter (Signed)
Can we make appointment with me in 6-8 weeks?    THanks!

## 2023-01-10 NOTE — Progress Notes (Signed)
PT Cancellation Note  Patient Details Name: [name] MRN: [id number] DOB: 05-Dec-1940   Cancelled Treatment:    Reason Eval/Treat Not Completed: (P) Other (comment) (Attempt @12 :50pm, RN defer as she recently got pt back to bed. Per RN, pt has been up with RW frequently.) Did not have time to reattempt in afternoon. Will continue efforts next date per PT plan of care as schedule permits.   [name] M [name] 01/10/2023, 7:02 PM

## 2023-01-10 NOTE — Progress Notes (Addendum)
NAMEChiante [name], MRN:  [id number], DOB:  1941-04-17, LOS: 6 ADMISSION DATE:  01/04/2023, CONSULTATION DATE:  01/09/23 REFERRING MD:  hoffman, CHIEF COMPLAINT:  CP, cough   History of Present Illness:  81yF with history of CVA, AF, dysphagia wwith recent hospitalization for RSV/acute hypoxic respiratory failure with some functional decline since with 2 falls requiring ED visits. She presented to ED 3/20 with persistent cough, fever to 103, confusion.   She was found to have multifocal pneumonia and was started on unasyn eventually - though stable O2 requirement, primary team concerned about treatment failure due to her worsening acute metabolic encephalopathy and worsening radiographic extent on CXR.   Pertinent  Medical History  CVA AF Dysphagia Recent hospitalization for RSV  Significant Hospital Events: Including procedures, antibiotic start and stop dates in addition to other pertinent events   3/20 admitted and started on unasyn and azithromcyin. Stable 2L O2 requirement throughout.  Interim History / Subjective:   No acute distress Objective   Blood pressure 121/70, pulse 78, temperature 97.6 F (36.4 C), temperature source Oral, resp. rate 15, height 4\' 8"  (1.422 m), weight 61 kg, SpO2 99 %.        Intake/Output Summary (Last 24 hours) at 01/10/2023 1128 Last data filed at 01/10/2023 1113 Gross per 24 hour  Intake 360 ml  Output 751 ml  Net -391 ml   Filed Weights   01/04/23 0913 01/04/23 1903  Weight: 68.5 kg 61 kg    Examination: General: 82 year old female HEENT: MM pink/moist no JVD or lymphadenopathy Neuro: Interacts well with family interpreter assists CV: Heart sounds are regular PULM: Coarse rhonchi bilaterally O2 saturation is 99% on 2 L  GI: soft, bsx4 active  GU: Voids Extremities: warm/dry, 1+ edema  Skin: no rashes or lesions   Resolved Hospital Problem list    Assessment & Plan:   # Multifocal pneumonia Worsening radiographic extent while on  course of unasyn, azithro. Recurrent aspiration and weak cough (favored) vs secondary OP related to prior RSV infection vs resistant or atypical organism vs delayed clinical improvement in setting possible MM.   Monitor culture data Continue antimicrobial therapy Bronchodilators Give consideration to noninvasive mechanical ventilatory support as she has untreated obstructive sleep apnea Flutter valve Incentive spirometer Swallow study Steroids CT consistent with multifocal pneumonia     # Acute metabolic encephalopathy TME from sepsis from multifocal pneumonia vs medication effect from tussionex vs hospital acquired delirium - would check UA, TSH. vbg  Will follow      Best Practice (right click and "Reselect all SmartList Selections" daily)   Per primary  Labs   CBC: Recent Labs  Lab 01/04/23 0920 01/04/23 1724 01/06/23 0022 01/07/23 0018 01/08/23 0040 01/09/23 0810 01/10/23 0024  WBC 14.1*   < > 13.4* 11.2* 13.1* 12.3* 11.7*  NEUTROABS 4.9  --   --   --   --   --   --   HGB 9.8*   < > 9.2* 8.9* 9.4* 9.0* 9.0*  HCT 30.9*   < > 29.0* 27.6* 30.1* 28.2* 27.7*  MCV 106.2*   < > 106.6* 106.6* 108.3* 106.4* 106.1*  PLT 409*   < > 399 381 435* 378 352   < > = values in this interval not displayed.    Basic Metabolic Panel: Recent Labs  Lab 01/04/23 0920 01/04/23 1724 01/06/23 0022 01/07/23 0018 01/08/23 0040 01/09/23 0810 01/10/23 0024  NA 137   < > 139 140 141 140 139  K 3.7   < > 3.9 4.1 3.9 3.4* 4.5  CL 102   < > 105 107 107 106 105  CO2 24   < > 25 23 26 25 27   GLUCOSE 152*   < > 129* 147* 163* 121* 129*  BUN 10   < > 12 16 21 12 14   CREATININE 0.66   < > 0.78 0.68 0.83 0.53 0.65  CALCIUM 8.8*   < > 8.4* 8.1* 8.6* 8.2* 8.5*  MG 1.8  --   --   --   --  1.7  --    < > = values in this interval not displayed.   GFR: Estimated Creatinine Clearance: 40.2 mL/min (by C-G formula based on SCr of 0.65 mg/dL). Recent Labs  Lab 01/04/23 0943 01/04/23 1146  01/04/23 1809 01/04/23 2111 01/05/23 0010 01/07/23 0018 01/08/23 0040 01/09/23 0810 01/10/23 0024  PROCALCITON  --   --   --   --   --   --   --  <0.10  --   WBC  --   --   --   --    < > 11.2* 13.1* 12.3* 11.7*  LATICACIDVEN 1.8 2.6* 2.4* 1.1  --   --   --   --   --    < > = values in this interval not displayed.    Liver Function Tests: Recent Labs  Lab 01/04/23 0920  AST 18  ALT 9  ALKPHOS 62  BILITOT 1.0  PROT 6.4*  ALBUMIN 3.5   Recent Labs  Lab 01/04/23 0920  LIPASE 27   No results for input(s): "AMMONIA" in the last 168 hours.  ABG    Component Value Date/Time   PHART 7.331 (L) 01/04/2023 1724   PCO2ART 45.1 01/04/2023 1724   PO2ART 95 01/04/2023 1724   HCO3 33.1 (H) 01/09/2023 1801   TCO2 25 01/04/2023 1724   ACIDBASEDEF 2.0 01/04/2023 1724   O2SAT 91.7 01/09/2023 1801     Coagulation Profile: No results for input(s): "INR", "PROTIME" in the last 168 hours.  Cardiac Enzymes: No results for input(s): "CKTOTAL", "CKMB", "CKMBINDEX", "TROPONINI" in the last 168 hours.  HbA1C: Hgb A1c MFr Bld  Date/Time Value Ref Range Status  10/14/2022 05:34 AM 6.9 (H) 4.8 - 5.6 % Final    Comment:    (NOTE)         Prediabetes: 5.7 - 6.4         Diabetes: >6.4         Glycemic control for adults with diabetes: <7.0   10/18/2020 06:38 AM 6.4 (H) 4.8 - 5.6 % Final    Comment:    (NOTE) Pre diabetes:          5.7%-6.4%  Diabetes:              >6.4%  Glycemic control for   <7.0% adults with diabetes     CBG: Recent Labs  Lab 01/09/23 1104 01/09/23 1622 01/09/23 1841 01/09/23 2112 01/10/23 0606  GLUCAP 141* 142* [place] Minor ACNP Acute Care Nurse Practitioner [place] Please consult [place] 01/10/2023, 11:28 AM   01/10/2023 APP Richardson Landry Minor and I saw and evaluated the patient. Discussed with them and agree with their findings and plan as documented in the their note.   S:  Feeling better, less cough  over last 24 hours. Much more alert. Spoke with video interpreter and called [name]'s daughter  to discuss plan.   O: Blood pressure 121/70, pulse 78, temperature 97.6 F (36.4 C), temperature source Oral, resp. rate 15, height 4\' 8"  (1.422 m), weight 61 kg, SpO2 100 %.   Exam: Gen:       No acute distress HEENT:  tracking, sclera anicteric Neck:      No masses, JVD Lungs:    Clear to auscultation bilaterally; normal respiratory effort CV:         Regular rate and rhythm; no murmurs Abd:      + bowel sounds; soft, non-tender; no palpable masses, no distension Ext:    No edema; adequate peripheral perfusion Skin:       Warm and dry; no rash Neuro:    attends to converstaion, grossly nonfocal  CT Chest with multifocal ggo, consolidative opacities greatest RML, RLL    A:  # Multifocal pneumonia While she may simply have recurrent aspiration and may just have had a boost in energy from steroid exposure, now with greater suspicion she could have organizing pneumonia - whether related to recent RSV infection or as a primary condition. There is also background of much more chronic mosaic attenuation vs multifocal ggo (opacities present even during inspiration) - unsure if there is underlying obstructive lung disease or if instead there's an underlying ILD with ggo.    P:  - check connective tissue disease serologies (ordered) - would stop ABX - f/u sputum, AFB cultures - pulmonary hygiene as ordered here - esophagram when able - at home can transition to albuterol 1-2 puff BID followed by flutter valve 10 slow but firm puffs BID and mucinex prn to loosen up phlegm - transition to prednisone 40 mg daily for 2 weeks, decrease to 30 mg daily for a week, then 20 mg daily for a week, then 10 mg daily till clinic follow up and CT Chest which I will arrange - no need for PJP ppx if she completes taper this quickly without deterioration - Discussed with daughter [name] if she deteriorates as  prednisone dose is lowered she is to call our clinic. Or if there is deterioration in general in which case bronchoscopy could be considered to look for atypical infection.    Will sign off but we are glad to be reinvolved as condition changes   [place]

## 2023-01-10 NOTE — TOC Initial Note (Signed)
Transition of Care [facility]) - Initial/Assessment Note    Patient Details  Name: [name] MRN: [id number] Date of Birth: [birth year]  [facility] [place] CM/SW Contact:    Cyndi Bender, RN Phone Number: 01/10/2023, 4:03 PM  Clinical Narrative:                 Spoke to patient's daughter, Makayle, regarding transition needs.  Patient has 20 PCS hours a week.  Faxed PCS change of status form to request increase PCS hours.  Patient has used [facility] in the past and daughter would like to use them again.  Tommi Rumps with [facility] accepted referral for PT, OT, SW, Aide. Daughter requesting hospital bed at discharge.  Address, Phone number and PCP verified. TOC will continue to follow for needs.   Need home health orders for pt, ot, aide, sw  and hospital bed orders  Expected Discharge Plan: [place] Barriers to Discharge: Continued Medical Work up   Patient Goals and CMS Choice Patient states their goals for this hospitalization and ongoing recovery are:: return home CMS Medicare.gov Compare Post Acute Care list provided to:: Patient Represenative (must comment) Choice offered to / list presented to : Adult Children      Expected Discharge Plan and Services     Post Acute Care Choice: Home Health Living arrangements for the past 2 months: Single Family Home                           HH Arranged: PT, OT, Nurse's Aide, Social Work [employer] Agency: [place] Date [place]: 01/10/23 Time [place]: 59 Representative spoke with at [place]: [place] Arrangements/Services Living arrangements for the past 2 months: [place] Lives with:: Adult Children Patient language and need for interpreter reviewed:: Yes Do you feel safe going back to the place where you live?: Yes      Need for Family Participation in Patient Care: Yes (Comment) Care giver support system in place?: Yes (comment) Current home services:   (walker) Criminal Activity/Legal Involvement Pertinent to Current Situation/Hospitalization: No - Comment as needed  Activities of Daily Living      Permission Sought/Granted                  Emotional Assessment         Alcohol / Substance Use: Not Applicable Psych Involvement: No (comment)  Admission diagnosis:  Acute hypoxic respiratory failure ([place]) [J96.01] Patient Active Problem List   Diagnosis Date Noted   Sepsis ([place]) 01/04/2023   Pneumonia 01/04/2023   RSV bronchitis 10/13/2022   Macrocytic anemia 12/09/2021   Back pain 12/09/2021   History of CVA with residual deficit 12/09/2021   Postoperative pain    Precordial pain    Pelvic fracture ([place]) 10/20/2020   Hip fracture ([place]) 10/18/2020   Closed fracture of multiple pubic rami, right, initial encounter ([place]) 10/17/2020   Abdominal pain 10/17/2020   Right knee pain 10/17/2020   Fall at home, initial encounter 10/17/2020   SIRS (systemic inflammatory response syndrome) ([place]) 02/13/2018   Atrial fibrillation with RVR ([place]) 02/13/2018   Hypotension 02/12/2018   Primary osteoarthritis of left knee 02/09/2017   SDH (subdural hematoma) (HCC)    Urinary frequency    Type 2 diabetes mellitus with peripheral neuropathy (HCC)    Cough    Gait disturbance, post-stroke 10/20/2016   Hemiparesis affecting left side as  late effect of stroke ([place]) 10/20/2016   Essential hypertension 10/19/2016   Hyperlipidemia LDL goal <70 123XX123   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[place]) 123XX123   Left hemiparesis ([place])    Atrial fibrillation (HCC)    History of fall    History of traumatic subdural hematoma    Vascular headache    Hypokalemia    Leukocytosis    Acute blood loss anemia    Hypoalbuminemia due to protein-calorie malnutrition ([place])    Dysphagia, post-stroke    Acute hypoxic respiratory failure (HCC)    Acute embolic stroke ([place]) - R putamen/caudate and R insular infarcts s/p TICI3 revascularization w/ mechanical  thrombectomy d/t AF not on [facility] 10/15/2016   Pressure injury of skin 10/15/2016   HCAP (healthcare-associated pneumonia) 11/09/2014   Weakness 08/25/2014   Acute bronchitis 08/25/2014   UTI (lower urinary tract infection) 08/25/2014   Fracture of lumbar spine ([place]) 08/25/2014   Subdural hematoma, post-traumatic (HCC) 05/12/2014   Chronic atrial fibrillation ([place]) 05/12/2014   Diabetes ([place]) 05/12/2014   OSA (obstructive sleep apnea) 05/12/2014   PCP:  Haydee Salter, MD Pharmacy:   [facility] [place], [place] - [address] AT [address] [place] [address] Phone: [phone] Fax: [place], [place] AT [place] [address] Phone: [phone] Fax: [phone]     Social Determinants of Health (SDOH) Social History: SDOH Screenings   Tobacco Use: Medium Risk (01/04/2023)   SDOH Interventions:     Readmission Risk Interventions    10/17/2022    1:36 PM  Readmission Risk Prevention Plan  Transportation Screening Complete  PCP or Specialist Appt within 5-7 Days Complete  Home Care Screening Complete  Medication Review (RN CM) Complete

## 2023-01-10 NOTE — Progress Notes (Signed)
Interval history Overall improved from yesterday.  Still with a wracking cough.  Reports persistent fatigue and malaise.  Daughter accompanying her today, reports that she had some food and drink last night.  She agrees that the patient seems improved from yesterday.  Physical exam Blood pressure 126/60, pulse 77, temperature 97.8 F (36.6 C), temperature source Oral, resp. rate 12, height 4\' 8"  (1.422 m), weight 61 kg, SpO2 99 %.  Comfortable appearing Heart rate normal, rhythm irregular, strong radial pulse, no appreciable JVD, no lower extremity edema, extremities are warm and well-perfused Breathing is regular and unlabored on 2 L via nasal cannula, diffuse crackles throughout all lung fields, scant wheezing Skin is warm and dry Alert and oriented, answers questions appropriately and follows simple instructions  Labs, images, and other studies VBG 7.39/55/59/33.1 Electrolytes stable Creatinine stable White blood cells 11.7 Hemoglobin 9 TSH 0.179 UA negative SPEP without M spike  Assessment and plan Hospital day [name] is a 82 y.o. admitted for sepsis and hypoxic respiratory failure due to multifocal pneumonia.  Principal Problem:   Acute hypoxic respiratory failure (HCC) Active Problems:   Diabetes ([place])   Atrial fibrillation with RVR (HCC)   Macrocytic anemia   Sepsis (HCC)   Pneumonia  Acute hypoxic respiratory failure Sepsis Multifocal pneumonia, right middle lobe predominance Respiratory status remained stable.  Encephalopathy improved today.  Lung exam stable with persistent crackles but significantly improved wheeze.  Continue prednisone and continue Unasyn, in the meantime try to get sputum sample to better direct antibiotic therapy.  Still low suspicion for ARDS given good oxygenation on low-flow oxygen.  Very likely significant component of underlying chronic respiratory disease like COPD or asthma.  Slow clinical improvement may be due to  to recurrent aspiration and weak cough versus organizing pneumonia versus atypical organism.  Appreciate pulmonology assistance with this case. - IV ampicillin-sulbactam - DuoNebs every 4 hours as needed for wheezing shortness of breath - Hypertonic saline nebs twice daily - Guaifenesin twice daily - Prednisone 40 mg p.o. daily - Benzonatate and Tussionex as needed for cough  Acute metabolic encephalopathy Secondary to sepsis and respiratory failure from pneumonia.  May be associated with some delirium as the patient's mental status tends to wax and wane irrespective of respiratory status.  Continue treating underlying cause.  UA negative.  TSH may be low due to euthyroid sick syndrome in setting of severe illness.  VBG without hypercapnia.   Chronic A-fib RVR Stable with rates 90s to 110s in setting of pneumonia.  DOAC DC'd in favor of heparin in event patient needs bronchoscopy. - Metoprolol 25 mg twice daily - Heparin 5000 units SQ every 8 hours   Dysphagia Recent functional decline Stroke several years ago.  Has declined more precipitously in the last 3 to 6 months.  Proceed with esophagram. - Dysphagia 3 with thin liquids - Follow-up esophagram   Diabetes A1c 6.9.  Metformin outpatient. - SSI moderate   Macrocytic anemia B12 and folate within normal limits.  Macrocytosis makes iron deficiency anemia less likely.  Kappa lambda ratio within normal limits.  Rest of myeloma panel pending.  Diet: Dysphagia 3 with thin liquids IVF: None VTE: Therapeutic dose heparin Code: Full PT/OT recommendations: Frequent to constant supervision/assistance TOC recommendations: Requests completion of paperwork for more PCS hours Family Update: At bedside  Lauren Gasser MD 01/10/2023, 6:45 AM  Pager: [phone] After 5pm or weekend:  [phone]  

## 2023-01-10 NOTE — Telephone Encounter (Signed)
Can we make appointment with me in 6-8 weeks?   THanks!

## 2023-01-11 DIAGNOSIS — D539 Nutritional anemia, unspecified: Secondary | ICD-10-CM | POA: Diagnosis not present

## 2023-01-11 DIAGNOSIS — J9601 Acute respiratory failure with hypoxia: Secondary | ICD-10-CM | POA: Diagnosis not present

## 2023-01-11 DIAGNOSIS — J189 Pneumonia, unspecified organism: Secondary | ICD-10-CM | POA: Diagnosis not present

## 2023-01-11 LAB — GLUCOSE, CAPILLARY
Glucose-Capillary: 134 mg/dL — ABNORMAL HIGH (ref 70–99)
Glucose-Capillary: 143 mg/dL — ABNORMAL HIGH (ref 70–99)
Glucose-Capillary: 103 mg/dL — ABNORMAL HIGH (ref 70–99)

## 2023-01-11 LAB — BASIC METABOLIC PANEL
Anion gap: 11 (ref 5–15)
BUN: 15 mg/dL (ref 8–23)
CO2: 28 mmol/L (ref 22–32)
Calcium: 8.7 mg/dL — ABNORMAL LOW (ref 8.9–10.3)
Chloride: 101 mmol/L (ref 98–111)
Creatinine, Ser: 0.83 mg/dL (ref 0.44–1.00)
GFR, Estimated: >60 mL/min (ref >60)
Glucose, Bld: 135 mg/dL — ABNORMAL HIGH (ref 70–99)
Potassium: 3.8 mmol/L (ref 3.5–5.1)
Sodium: 140 mmol/L (ref 135–145)

## 2023-01-11 LAB — CBC
HCT: 27.4 % — ABNORMAL LOW (ref 36.0–46.0)
Hemoglobin: 8.8 g/dL — ABNORMAL LOW (ref 12.0–15.0)
MCH: 33.5 pg (ref 26.0–34.0)
MCHC: 32.1 g/dL (ref 30.0–36.0)
MCV: 104.2 fL — ABNORMAL HIGH (ref 80.0–100.0)
Platelets: 396 10*3/uL (ref 150–400)
RBC: 2.63 MIL/uL — ABNORMAL LOW (ref 3.87–5.11)
RDW: 13.7 % (ref 11.5–15.5)
WBC: 13 10*3/uL — ABNORMAL HIGH (ref 4.0–10.5)
nRBC: 0.5 % — ABNORMAL HIGH (ref 0.0–0.2)

## 2023-01-11 LAB — CK: Total CK: 27 U/L — ABNORMAL LOW (ref 38–234)

## 2023-01-11 MED ORDER — APIXABAN 2.5 MG PO TABS
2.5000 mg | ORAL_TABLET | Freq: Two times a day (BID) | ORAL | Status: DC
  Administered 2023-01-11 – 2023-01-14 (×7): 2.5 mg via ORAL
  Filled 2023-01-11 (×7): qty 1

## 2023-01-11 MED ORDER — PREDNISONE 20 MG PO TABS
40.0000 mg | ORAL_TABLET | Freq: Every day | ORAL | Status: DC
  Administered 2023-01-12 – 2023-01-14 (×3): 40 mg via ORAL
  Filled 2023-01-11 (×3): qty 2

## 2023-01-11 MED ORDER — PREDNISONE 20 MG PO TABS: 20.0000 mg | ORAL_TABLET | Freq: Every day | ORAL | Status: DC

## 2023-01-11 MED ORDER — PREDNISONE 10 MG PO TABS: 10.0000 mg | ORAL_TABLET | Freq: Every day | ORAL | Status: DC

## 2023-01-11 MED ORDER — PREDNISONE 20 MG PO TABS: 30.0000 mg | ORAL_TABLET | Freq: Every day | ORAL | Status: DC

## 2023-01-11 MED ORDER — LOPERAMIDE HCL 2 MG PO CAPS: 2.0000 mg | ORAL_CAPSULE | Freq: Once | ORAL | Status: DC | PRN

## 2023-01-11 MED ORDER — PANTOPRAZOLE SODIUM 40 MG PO TBEC
40.0000 mg | DELAYED_RELEASE_TABLET | Freq: Every day | ORAL | Status: DC
  Administered 2023-01-11 – 2023-01-14 (×4): 40 mg via ORAL
  Filled 2023-01-11 (×4): qty 1

## 2023-01-11 NOTE — Progress Notes (Signed)
Pt had the CPAP on for couple of min then she  wants it be taken off. now refusing to have it back on. Pt sating 95% in room Air.

## 2023-01-11 NOTE — Progress Notes (Signed)
Patient asleep at this time. Family wishes to not wake her up at this time.

## 2023-01-11 NOTE — Progress Notes (Signed)
CBG not crossing over to results- CBG 151

## 2023-01-11 NOTE — Progress Notes (Signed)
Interval history Declined CPAP overnight.  Had some confusion.  Daughter states that she received 2 calls in the middle of the night and patient was asking for help to get to the kitchen.  Some desaturations charted, nurse reports that patient was pulling at pulse oximetry, also suspect she desaturations some during transfers from bed to chair.  Feeling better than yesterday overall.  Still with cough and some discomfort with breathing.  Reports 3-4 episodes of loose stool, but no abdominal pain.  Physical exam Blood pressure (!) 156/93, pulse 73, temperature 98.1 F (36.7 C), temperature source Oral, resp. rate 20, height 4\' 8"  (1.422 m), weight 61 kg, SpO2 98 %.  Comfortable appearing, in chair Heart rate tachycardic, rhythm is irregularly irregular, radial pulses are strong, extremities are cool Breathing is regular and unlabored on 2 L via nasal cannula, diffuse crackles, no wheezing Skin is warm and dry Abdomen soft and nontender Alert, answers questions appropriately  Labs, images, and other studies Esophagram shows mild dysmotility but no evidence of clinically significant stenosis or stricture.  Assessment and plan Hospital day [name] is a 82 y.o. admitted for sepsis and acute hypoxic respiratory failure due to aspiration pneumonia, also thought to have significant underlying subacute to chronic pulmonary disease.  Principal Problem:   Acute hypoxic respiratory failure (HCC) Active Problems:   Diabetes ([place])   Atrial fibrillation with RVR (HCC)   Macrocytic anemia  Acute hypoxic respiratory failure Aspiration pneumonia Likely underlying subacute or chronic lung disease Still with mild acute hypoxic respiratory failure, but respiratory status stable.  Status post treatment for aspiration pneumonia, sepsis resolved.  Prednisone taper for underlying subacute to chronic pulmonary disease, organizing pneumonia versus ILD.  Pending serology studies for  connective tissue disease.  Will get ambulatory O2 saturations.  Follow-up with pulmonology outpatient. - Prednisone 40 mg p.o. daily through 01/22/2023, to taper by 10 mg weekly - DC Unasyn - DuoNebs every 4 hours as needed for wheezing shortness of breath - Hypertonic saline nebs twice daily - Guaifenesin twice daily - Benzonatate and Tussionex as needed for cough   Acute metabolic encephalopathy Remained confused overnight.  Secondary to severe illness versus hospital delirium versus steroid adverse effect.   Chronic A-fib RVR Rates in the 110s today.  DOAC restarted.  Continue monitoring on telemetry. - Metoprolol 25 mg twice daily - Eliquis 2.5 mg twice daily   Dysphagia Recent functional decline Stroke several years ago.  Has declined more precipitously in the last 3 to 6 months.  Esophagram without clinically significant stenosis or stricture.  Wonder about GERD and functional dysphagia.  Will start empiric PPI. - Pantoprazole 40 mg p.o. daily - Dysphagia 3 with thin liquids - Follow-up esophagram   Diabetes A1c 6.9.  Metformin outpatient. - SSI moderate   Macrocytic anemia B12 and folate within normal limits.  Multiple myeloma unlikely given negative panel and no evidence of monoclonal spike.  Macrocytosis makes iron deficiency anemia less likely, will obtain iron studies to evaluate for this. - Follow-up iron, TIBC, ferritin  Diet: Dysphagia 3 with thin liquids IVF: None VTE: apixaban (ELIQUIS) tablet 2.5 mg Start: 01/11/23 1200  Code: Full PT/OT recommendations: Constant supervision/assistance TOC recommendations: Following Family Update: At bedside  Discharge plan: Anticipate discharge to home in next couple of days, perhaps with oxygen.  [name] MD 01/11/2023, 11:38 AM  Pager: [phone] After 5pm or weekend: [phone]

## 2023-01-11 NOTE — Progress Notes (Signed)
Occupational Therapy Treatment Patient Details Name: [name] MRN: [id number] DOB: 1941/05/25 Today's Date: 01/11/2023   History of present illness 82 year old [place] female, presents to ED via EMS 3/20. Pt tachycardic with intermittent desaturations in high 80s on RA. Chest CT suggestive of PNA. Admitted for presumed sepsis due to PNA PMH Severe case if RSV leading to hospitalization 1/24, repeated falls, 2 requiring visits to the ED. DM2, A-fib, acute cerebral infarct, 2017.   OT comments  Pt has improved since evaluation with alertness and participation. Daughter in room translating. Daughter states pt has some dementia at baseline and trouble with memory and insight into deficits. Pt has had multiple falls at home and feel pt is a fall risk returning home. Spoke with daughter at length about weighing the cognitive decline of moving to yet another facility if pt went to SNF for rehab vs. HHOT in her own environment. Explained to daughter that pt remains a fall risk and will need someone with her at all times.  Feel SNF is a reasonable option due to pt requiring constant care but understand wanting to take pt home if they can handle her at home. Feel pt will need home O2.    At rest on RA:  O2 sats:  93% Mobilizing in room on RA:  O2 sats: 87% 2L O2 given: Pt returned to O2 sats of 92%  Will continue to see with focus on OOB and balance in standing as  pt is a high fall risk.   Recommendations for follow up therapy are one component of a multi-disciplinary discharge planning process, led by the attending physician.  Recommendations may be updated based on patient status, additional functional criteria and insurance authorization.    Assistance Recommended at Discharge Frequent or constant Supervision/Assistance  Patient can return home with the following  A lot of help with walking and/or transfers;A lot of help with bathing/dressing/bathroom;Assistance with cooking/housework;Assistance  with feeding;Direct supervision/assist for medications management;Assist for transportation;Direct supervision/assist for financial management;Help with stairs or ramp for entrance   Equipment Recommendations  None recommended by OT    Recommendations for Other Services      Precautions / Restrictions Precautions Precautions: Fall Precaution Comments: multiple falls Restrictions Weight Bearing Restrictions: No       Mobility Bed Mobility               General bed mobility comments: Pt in chair on arrival    Transfers Overall transfer level: Needs assistance Equipment used: Rolling walker (2 wheels) Transfers: Sit to/from Stand, Bed to chair/wheelchair/BSC Sit to Stand: Min assist     Step pivot transfers: Mod assist     General transfer comment: cues to shift weight to ball of feet and cues for all hand placement.     Balance Overall balance assessment: Needs assistance Sitting-balance support: Feet supported, No upper extremity supported Sitting balance-Leahy Scale: Fair Sitting balance - Comments: L lateral lean Postural control: Left lateral lean, Posterior lean Standing balance support: Bilateral upper extremity supported, During functional activity, Reliant on assistive device for balance Standing balance-Leahy Scale: Poor Standing balance comment: requires UE support and external assist with gait belt for maintaining balance                           ADL either performed or assessed with clinical judgement   ADL Overall ADL's : Needs assistance/impaired Eating/Feeding: Set up;Sitting   Grooming: Minimal assistance;Sitting Grooming Details (indicate cue type and reason):  Pt with posterior lean when standing. Had focused on toileting;therefore did not stand to groom.             Lower Body Dressing: Moderate assistance;Sitting/lateral leans Lower Body Dressing Details (indicate cue type and reason): assist to start pullup over  legs Toilet Transfer: Minimal assistance;Stand-pivot;BSC/3in1 Toilet Transfer Details (indicate cue type and reason): Pt with posterior lean Toileting- Clothing Manipulation and Hygiene: Maximal assistance Toileting - Clothing Manipulation Details (indicate cue type and reason): Pt attempted to clean self while therapist provided mod assist to stand due to posterior lean     Functional mobility during ADLs: Moderate assistance;Rolling walker (2 wheels) General ADL Comments: Pt more awake and participatory today.  Pt continues to have weakness and is very unsteady on feet.  Pt will need someone with her all the time at d/c    Extremity/Trunk Assessment Upper Extremity Assessment Upper Extremity Assessment: Generalized weakness   Lower Extremity Assessment Lower Extremity Assessment: Defer to PT evaluation        Vision       Perception     Praxis      Cognition Arousal/Alertness: Awake/alert Behavior During Therapy: Flat affect Overall Cognitive Status: History of cognitive impairments - at baseline                                 General Comments: daughter there to translate and feels pt is at baseline. Pt has cognitive deficits at baseline.        Exercises      Shoulder Instructions       General Comments Pt limited in areas of balance, cognition and weakness.    Pertinent Vitals/ Pain       Pain Assessment Pain Assessment: No/denies pain  Home Living                                          Prior Functioning/Environment              Frequency  Min 2X/week        Progress Toward Goals  OT Goals(current goals can now be found in the care plan section)  Progress towards OT goals: Progressing toward goals  Acute Rehab OT Goals Patient Stated Goal: to get well OT Goal Formulation: With patient/family Time For Goal Achievement: 01/20/23 Potential to Achieve Goals: Fair ADL Goals Pt Will Perform Grooming:  sitting;with supervision;with set-up Pt Will Perform Upper Body Dressing: with set-up;sitting Pt Will Perform Lower Body Dressing: with min guard assist;sit to/from stand Pt Will Transfer to Toilet: with min guard assist;ambulating;bedside commode Pt Will Perform Toileting - Clothing Manipulation and hygiene: with min assist;sit to/from stand;with min guard assist;sitting/lateral leans Additional ADL Goal #1: Pt will complete bed mobility at min guard level to prepare for EOB/OOB ADLs.  Plan      Co-evaluation                 AM-PAC OT "6 Clicks" Daily Activity     Outcome Measure   Help from another person eating meals?: A Little Help from another person taking care of personal grooming?: A Little Help from another person toileting, which includes using toliet, bedpan, or urinal?: A Lot Help from another person bathing (including washing, rinsing, drying)?: A Lot Help from another person to put on and taking off  regular upper body clothing?: A Little Help from another person to put on and taking off regular lower body clothing?: A Lot 6 Click Score: 15    End of Session Equipment Utilized During Treatment: Oxygen  OT Visit Diagnosis: Unsteadiness on feet (R26.81);Pain;Muscle weakness (generalized) (M62.81)   Activity Tolerance Patient limited by fatigue   Patient Left in chair;with call bell/phone within reach;with chair alarm set;with family/visitor present   Nurse Communication Mobility status        Time: 1127-1207 OT Time Calculation (min): 40 min  Charges: OT General Charges $OT Visit: 1 Visit OT Treatments $Self Care/Home Management : 38-52 mins    Glenford Peers 01/11/2023, 12:21 PM

## 2023-01-12 ENCOUNTER — Inpatient Hospital Stay (HOSPITAL_COMMUNITY): Payer: Medicare Other

## 2023-01-12 DIAGNOSIS — R4181 Age-related cognitive decline: Secondary | ICD-10-CM | POA: Diagnosis not present

## 2023-01-12 DIAGNOSIS — I340 Nonrheumatic mitral (valve) insufficiency: Secondary | ICD-10-CM | POA: Diagnosis not present

## 2023-01-12 DIAGNOSIS — J9601 Acute respiratory failure with hypoxia: Secondary | ICD-10-CM | POA: Diagnosis not present

## 2023-01-12 DIAGNOSIS — I4891 Unspecified atrial fibrillation: Secondary | ICD-10-CM | POA: Diagnosis not present

## 2023-01-12 DIAGNOSIS — R1310 Dysphagia, unspecified: Secondary | ICD-10-CM

## 2023-01-12 DIAGNOSIS — D539 Nutritional anemia, unspecified: Secondary | ICD-10-CM | POA: Diagnosis not present

## 2023-01-12 DIAGNOSIS — I361 Nonrheumatic tricuspid (valve) insufficiency: Secondary | ICD-10-CM | POA: Diagnosis not present

## 2023-01-12 LAB — CBC
HCT: 29.1 % — ABNORMAL LOW (ref 36.0–46.0)
Hemoglobin: 9.6 g/dL — ABNORMAL LOW (ref 12.0–15.0)
MCH: 34.4 pg — ABNORMAL HIGH (ref 26.0–34.0)
MCHC: 33 g/dL (ref 30.0–36.0)
MCV: 104.3 fL — ABNORMAL HIGH (ref 80.0–100.0)
Platelets: 399 10*3/uL (ref 150–400)
RBC: 2.79 MIL/uL — ABNORMAL LOW (ref 3.87–5.11)
RDW: 13.7 % (ref 11.5–15.5)
WBC: 13.5 10*3/uL — ABNORMAL HIGH (ref 4.0–10.5)
nRBC: 0.3 % — ABNORMAL HIGH (ref 0.0–0.2)

## 2023-01-12 LAB — BASIC METABOLIC PANEL
Anion gap: 11 (ref 5–15)
BUN: 11 mg/dL (ref 8–23)
CO2: 29 mmol/L (ref 22–32)
Calcium: 8.7 mg/dL — ABNORMAL LOW (ref 8.9–10.3)
Chloride: 100 mmol/L (ref 98–111)
Creatinine, Ser: 0.77 mg/dL (ref 0.44–1.00)
GFR, Estimated: >60 mL/min (ref >60)
Glucose, Bld: 112 mg/dL — ABNORMAL HIGH (ref 70–99)
Potassium: 3.8 mmol/L (ref 3.5–5.1)
Sodium: 140 mmol/L (ref 135–145)

## 2023-01-12 LAB — GLUCOSE, CAPILLARY
Glucose-Capillary: 165 mg/dL — ABNORMAL HIGH (ref 70–99)
Glucose-Capillary: 151 mg/dL — ABNORMAL HIGH (ref 70–99)
Glucose-Capillary: 107 mg/dL — ABNORMAL HIGH (ref 70–99)
Glucose-Capillary: 160 mg/dL — ABNORMAL HIGH (ref 70–99)
Glucose-Capillary: 193 mg/dL — ABNORMAL HIGH (ref 70–99)
Glucose-Capillary: 122 mg/dL — ABNORMAL HIGH (ref 70–99)

## 2023-01-12 LAB — ECHOCARDIOGRAM LIMITED
Area-P 1/2: 3.48 cm2
Height: 56 in
MV M vel: 4 m/s
MV Peak grad: 64 mmHg
P 1/2 time: 443 msec
S' Lateral: 2.7 cm
Weight: 2151.69 oz

## 2023-01-12 LAB — RHEUMATOID FACTOR: Rheumatoid fact SerPl-aCnc: 14.5 IU/mL — ABNORMAL HIGH (ref <14.0)

## 2023-01-12 LAB — IRON AND TIBC
Iron: 63 ug/dL (ref 28–170)
Saturation Ratios: 27 % (ref 10.4–31.8)
TIBC: 234 ug/dL — ABNORMAL LOW (ref 250–450)
UIBC: 171 ug/dL

## 2023-01-12 LAB — LEGIONELLA PNEUMOPHILA SEROGP 1 UR AG: L. pneumophila Serogp 1 Ur Ag: NEGATIVE

## 2023-01-12 LAB — ANCA TITERS
Atypical P-ANCA titer: <1:20 {titer}
C-ANCA: <1:20 {titer}
P-ANCA: <1:20 {titer}

## 2023-01-12 LAB — FERRITIN: Ferritin: 221 ng/mL (ref 11–307)

## 2023-01-12 LAB — CYCLIC CITRUL PEPTIDE ANTIBODY, IGG/IGA: CCP Antibodies IgG/IgA: 5 units (ref 0–19)

## 2023-01-12 LAB — ANA W/REFLEX IF POSITIVE: Anti Nuclear Antibody (ANA): NEGATIVE

## 2023-01-12 MED ORDER — DOCUSATE SODIUM 100 MG PO CAPS
100.0000 mg | ORAL_CAPSULE | Freq: Every day | ORAL | Status: DC
  Administered 2023-01-12 – 2023-01-14 (×3): 100 mg via ORAL
  Filled 2023-01-12 (×3): qty 1

## 2023-01-12 MED ORDER — METOPROLOL TARTRATE 25 MG PO TABS
25.0000 mg | ORAL_TABLET | Freq: Three times a day (TID) | ORAL | Status: DC
  Administered 2023-01-12 – 2023-01-14 (×7): 25 mg via ORAL
  Filled 2023-01-12 (×7): qty 1

## 2023-01-12 MED ORDER — METOPROLOL TARTRATE 50 MG PO TABS: 50.0000 mg | ORAL_TABLET | Freq: Two times a day (BID) | ORAL | Status: DC

## 2023-01-12 MED ORDER — NYSTATIN 100000 UNIT/ML MT SUSP
5.0000 mL | Freq: Four times a day (QID) | OROMUCOSAL | Status: DC
  Administered 2023-01-12 – 2023-01-14 (×9): 500000 [IU] via OROMUCOSAL
  Filled 2023-01-12 (×11): qty 5

## 2023-01-12 NOTE — Progress Notes (Signed)
Pt is finishing eating, then exam will be complete.

## 2023-01-12 NOTE — Progress Notes (Signed)
Physical Therapy Treatment Patient Details Name: [name] MRN: [id number] DOB: 1941/06/22 Today's Date: 01/12/2023   History of Present Illness 82 year old [place] female, presents to ED via EMS 3/20. Pt tachycardic with intermittent desaturations in high 80s on RA. Chest CT suggestive of PNA. Admitted for presumed sepsis due to PNA PMH Severe case if RSV leading to hospitalization 1/24, repeated falls, 2 requiring visits to the ED. DM2, A-fib, acute cerebral infarct, 2017.    PT Comments    Pt received in recliner, agreeable to therapy session and pt expressed eagerness to progress gait/standing tolerance. Pt daughter present and able to assist with chair follow/pt guarding with min cues for caregiver instruction. Pt making good progress toward goals, following simple commands well and with improved activity tolerance. Pt hypoxic on RA with exertion to SpO2 82% and needed 2L O2 [place] to maintain SpO2 90% and greater (intermittent desat with ambulation to 87% on 2L however poor waveform and improved when pt hand resting while standing). Family states they can arrange 24/7 assist and are hoping she will progress to +1 physical assist for gait/transfers while in rehab. Pt would benefit from orthotist consult for LLE AFO, MD notified. Disposition updated below per discussion with pt/family and supervising PT Benjamine Mola V per pt progress, anticipate she will be a good candidate for higher intensity therapies at least 3 hours/day and family report they are familiar with AIR from previous stays.   Recommendations for follow up therapy are one component of a multi-disciplinary discharge planning process, led by the attending physician.  Recommendations may be updated based on patient status, additional functional criteria and insurance authorization.  Follow Up Recommendations       Assistance Recommended at Discharge Frequent or constant Supervision/Assistance  Patient can return home with the following  Assistance with cooking/housework;Direct supervision/assist for medications management;Direct supervision/assist for financial management;Assist for transportation;Help with stairs or ramp for entrance;Two people to help with walking and/or transfers;A lot of help with bathing/dressing/bathroom   Equipment Recommendations  None recommended by PT;Other (comment) (consider LLE AFO orthotist consult, pt with chronic LLE weakness and tends to trip on L foot, limited to no L AROM dorsiflexion when cued)    Recommendations for Other Services Rehab consult     Precautions / Restrictions Precautions Precautions: Fall Precaution Comments: multiple falls, chronic L hemiplegia Restrictions Weight Bearing Restrictions: No     Mobility  Bed Mobility               General bed mobility comments: Pt in chair on arrival and wanted to stay up    Transfers Overall transfer level: Needs assistance Equipment used: Rolling walker (2 wheels) Transfers: Sit to/from Stand Sit to Stand: Min assist, +2 safety/equipment, Mod assist           General transfer comment: hand over hand assist for safe UE placement with each transfer with specific cues provided, pt responds better wtih Errorless Learning strategy due to hx mild cognitive impairment. Loss of balance at times requiring increased assist for lowering as she fatigued up to modA    Ambulation/Gait Ambulation/Gait assistance: Min assist, Mod assist, +2 safety/equipment Gait Distance (Feet): 25 Feet (84ft, 87ft, 11ft with seated breaks between all trials to recover SpO2) Assistive device: Rolling walker (2 wheels) Gait Pattern/deviations: Step-through pattern, Decreased dorsiflexion - left, Decreased weight shift to right, Step-to pattern, Trunk flexed       General Gait Details: Pt SpO2 with poor signal due to grip of fingers on RW, however on  RA SpO2 remains low with standing break as pt resting her hands, SpO2 82% on RA so pt increased to  2L and SpO2 improved to >92% within 30 seconds at rest. Pt SpO2 reading 87-94% on 2L for remainder of functional mobility with at times poor signal, tending to read better on her thumbs than on other fingers. Pt needing minA initially but with increased distance and LLE fatigue, pt needs intermittent modA for AD use and LLE stepping. Pt with decreased L dorsiflexion at baseline and may benefit from LLE AFO pending progress as L foot catching floor is greatly increasing her fall risk, MD notified. Chair follow for safety.   Stairs Stairs:  (pt not able this date, too deconditioned/fatigued)           Wheelchair Mobility    Modified Rankin (Stroke Patients Only)       Balance Overall balance assessment: Needs assistance Sitting-balance support: Feet supported, No upper extremity supported Sitting balance-Leahy Scale: Fair     Standing balance support: Bilateral upper extremity supported, During functional activity, Reliant on assistive device for balance Standing balance-Leahy Scale: Poor Standing balance comment: requires UE support and external assist with gait belt for maintaining balance, multiple posterior LOB even with RW support during standing rest breaks                            Cognition Arousal/Alertness: Awake/alert Behavior During Therapy: Flat affect Overall Cognitive Status: History of cognitive impairments - at baseline                                 General Comments: Daughter Paytience there to translate and feels pt is at baseline. Pt has cognitive deficits at baseline but very motivated and reponds better to Errorless learning strategy for safety cues with transfers/gait.        Exercises Other Exercises Other Exercises: reclined BLE AROM: ankle pumps/circles x 10 reps cues for B quad/glute sets in bed/chair, pt will need reinforcement. Other Exercises: IS x 10 reps x2 sets (pt achieves ~200-217mL with teachback)    General Comments  General comments (skin integrity, edema, etc.): SpO2 WFL on 2L, on RA pt desat to low 80's% with exertion and maintains 87% and higher on 2L/min (mostly 90% and greater, at times poor signal).      Pertinent Vitals/Pain Pain Assessment Pain Assessment: Faces Faces Pain Scale: Hurts little more Pain Location: intermittent low back pain with prolonged standing Pain Descriptors / Indicators: Discomfort, Grimacing Pain Intervention(s): Limited activity within patient's tolerance, Monitored during session, Repositioned, Other (comment) (seated rest breaks with back pain/fatigue)     PT Goals (current goals can now be found in the care plan section) Acute Rehab PT Goals Patient Stated Goal: to walk and get stronger before I go home PT Goal Formulation: With patient Time For Goal Achievement: 01/19/23 Progress towards PT goals: Progressing toward goals    Frequency    Min 1X/week      PT Plan Discharge plan needs to be updated       AM-PAC PT "6 Clicks" Mobility   Outcome Measure  Help needed turning from your back to your side while in a flat bed without using bedrails?: A Little Help needed moving from lying on your back to sitting on the side of a flat bed without using bedrails?: A Lot Help needed moving to and from  a bed to a chair (including a wheelchair)?: A Lot Help needed standing up from a chair using your arms (e.g., wheelchair or bedside chair)?: A Lot (mod cues) Help needed to walk in hospital room?: A Lot Help needed climbing 3-5 steps with a railing? : Total 6 Click Score: 12    End of Session Equipment Utilized During Treatment: Gait belt;Oxygen Activity Tolerance: Patient tolerated treatment well Patient left: in chair;with call bell/phone within reach;with chair alarm set;with family/visitor present (daughter in room; heels floated in recliner) Nurse Communication: Mobility status;Other (comment) (+2 for gait safety, pt needs 2L/min for amb) PT Visit  Diagnosis: Muscle weakness (generalized) (M62.81);Other abnormalities of gait and mobility (R26.89);History of falling (Z91.81);Repeated falls (R29.6);Unsteadiness on feet (R26.81);Dizziness and giddiness (R42)     Time: [id number] PT Time Calculation (min) (ACUTE ONLY): 31 min  Charges:  $Gait Training: 23-37 mins                     Monea Pesantez P., PTA Acute Rehabilitation Services Secure Chat Preferred 9a-5:30pm Office: [place] 01/12/2023, 3:43 PM

## 2023-01-12 NOTE — Progress Notes (Signed)
                 Interval history More tired than yesterday.  Reports a headache.  Breathing feels improved.  Daughter present, discussed discharge planning and leaving to SNF for subacute rehab versus home.  Daughter wonders about more acute inpatient rehab prior to discharge.  Physical exam Blood pressure 128/65, pulse 91, temperature 97.6 F (36.4 C), temperature source Oral, resp. rate 20, height 4\' 8"  (1.422 m), weight 61 kg, SpO2 90 %.  Tired appearing Heart rate tachycardic, rhythm is irregularly irregular, weak radial pulses, strong dorsalis pedis pulses, extremities are cool Breathing is regular and unlabored on 2 L via nasal cannula, some bilateral crackles but decreased from yesterday, scant expiratory wheeze anteriorly Alert but somnolent  Labs, images, and other studies Electrolytes and creatinine stable Ferritin 221 TSAT 27% CBC stable Rheumatoid factor 14.5 ANA negative  Assessment and plan Hospital day [place] is a 81 y.o. admitted for hypoxic respiratory failure due to pneumonia, status post antibiotics now on prednisone for presumed chronic to subacute underlying pulmonary disease, with waxing and waning clinical status.  Principal Problem:   Acute hypoxic respiratory failure (HCC) Active Problems:   Diabetes ([place])   Atrial fibrillation with RVR (HCC)   Macrocytic anemia  Acute hypoxic respiratory failure Exacerbation of underlying subacute or chronic lung disease Status post Unasyn and azithromycin for aspiration pneumonia.  Now on prednisone taper for organizing pneumonia after January 2024 bout with RSV versus ILD.  Still requiring O2 to maintain saturations >88% on ambulation. - Prednisone 40 mg p.o. daily through 01/22/2023, to taper by 10 mg weekly - Scheduled guaifenesin - Benzonatate, Tussionex, DuoNebs as needed  Acute metabolic encephalopathy Cool extremities Course seems to wax and wane.  More somnolent today whereas yesterday the day before  she was more alert.  On exam her extremities are cool.  This was noted 3 days ago but resolved by the following day.  However given her mental status in conjunction with this finding again, I wonder about a low output state.  She is euvolemic and thus I do not suspect cor pulmonale.  Will get limited echo to evaluate LVEF. - Follow-up TTE  Chronic A-fib RVR Rate in the 100s to 120s.  Increase beta-blocker today.  Continue telemetry monitoring. 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  Empiric PPI for GERD and functional dysphagia. - Pantoprazole 40 mg p.o. daily - Dysphagia 3 diet with thin liquids  Diabetes Blood sugar well-controlled. - SSI moderate  Macrocytic anemia B12 and folate within normal limits.  Multiple myeloma panel negative.  Iron replete. - Follow-up outpatient  Diet: Dysphagia 3 with thin liquids IVF: None VTE: apixaban (ELIQUIS) tablet 2.5 mg Start: 01/11/23 1200  Code: Full PT/OT recommendations: Pending today's evaluation Family Update: At bedside  Discharge plan: SNF for subacute rehab versus acute inpatient rehab versus home with family with constant supervision.  [name] MD 01/12/2023, 7:14 AM  Pager: [phone] After 5pm or weekend: [phone]

## 2023-01-12 NOTE — Progress Notes (Signed)
  Echocardiogram 2D Echocardiogram has been performed.  [name] 01/12/2023, 3:49 PM

## 2023-01-12 NOTE — Plan of Care (Signed)

## 2023-01-13 DIAGNOSIS — J9601 Acute respiratory failure with hypoxia: Secondary | ICD-10-CM | POA: Diagnosis not present

## 2023-01-13 DIAGNOSIS — D539 Nutritional anemia, unspecified: Secondary | ICD-10-CM | POA: Diagnosis not present

## 2023-01-13 DIAGNOSIS — R4181 Age-related cognitive decline: Secondary | ICD-10-CM | POA: Diagnosis not present

## 2023-01-13 DIAGNOSIS — I4891 Unspecified atrial fibrillation: Secondary | ICD-10-CM | POA: Diagnosis not present

## 2023-01-13 LAB — CBC
HCT: 27.7 % — ABNORMAL LOW (ref 36.0–46.0)
Hemoglobin: 8.6 g/dL — ABNORMAL LOW (ref 12.0–15.0)
MCH: 33.6 pg (ref 26.0–34.0)
MCHC: 31 g/dL (ref 30.0–36.0)
MCV: 108.2 fL — ABNORMAL HIGH (ref 80.0–100.0)
Platelets: 357 10*3/uL (ref 150–400)
RBC: 2.56 MIL/uL — ABNORMAL LOW (ref 3.87–5.11)
RDW: 13.9 % (ref 11.5–15.5)
WBC: 10.5 10*3/uL (ref 4.0–10.5)
nRBC: 0.2 % (ref 0.0–0.2)

## 2023-01-13 LAB — BASIC METABOLIC PANEL
Anion gap: 10 (ref 5–15)
BUN: 13 mg/dL (ref 8–23)
CO2: 31 mmol/L (ref 22–32)
Calcium: 8.4 mg/dL — ABNORMAL LOW (ref 8.9–10.3)
Chloride: 98 mmol/L (ref 98–111)
Creatinine, Ser: 0.78 mg/dL (ref 0.44–1.00)
GFR, Estimated: >60 mL/min (ref >60)
Glucose, Bld: 131 mg/dL — ABNORMAL HIGH (ref 70–99)
Potassium: 4 mmol/L (ref 3.5–5.1)
Sodium: 139 mmol/L (ref 135–145)

## 2023-01-13 LAB — GLUCOSE, CAPILLARY
Glucose-Capillary: 82 mg/dL (ref 70–99)
Glucose-Capillary: 281 mg/dL — ABNORMAL HIGH (ref 70–99)
Glucose-Capillary: 184 mg/dL — ABNORMAL HIGH (ref 70–99)
Glucose-Capillary: 162 mg/dL — ABNORMAL HIGH (ref 70–99)

## 2023-01-13 NOTE — Progress Notes (Signed)
Mobility Specialist: Progress Note   01/13/23 1508  Mobility  Activity Transferred to/from [facility]  Level of Assistance Minimal assist, patient does 75% or more  Assistive Device Front wheel walker  Distance Ambulated (ft) 6 ft (2'+4')  Activity Response Tolerated well  Mobility Referral Yes  $Mobility charge 1 Mobility   Post-Mobility: 90 HR, 94% SpO2  Pt received in the chair and requesting to use BR. Assisted pt to the [facility] requiring minA to stand and contact guard during transfer. Pt declining further ambulation at this time and requesting to return to bed. Pt assisted back to bed with call bell at her side. Bed alarm is on.   [place] Maija Biggers Mobility Specialist Please contact via SecureChat or Rehab office at [phone]

## 2023-01-13 NOTE — Progress Notes (Signed)
Orthopedic Tech Progress Note Patient Details:  [name] 10-02-1941 [id number] Called in order to [place] for AFO Patient ID: [name], female   DOB: 05/21/1941, 82 y.o.   MRN: [id number]  [name] 01/13/2023, 11:48 AM

## 2023-01-13 NOTE — Progress Notes (Signed)
Inpatient Rehab Coordinator Note:  I met with patient and her daughter, Shakita, at bedside to discuss CIR recommendations and goals/expectations of CIR stay.  We reviewed 3 hrs/day of therapy, physician follow up, and average length of stay 2 weeks (dependent upon progress) with goals of supervision to mod i.  Charina confirms pt will have 24/7 support at discharge and is agreeable to CIR admission.  We reviewed Medicare/Medicaid benefits.   Inpatient Rehab Admissions Coordinator:   I have a bed for this pt to admit to CIR on Saturday (3/30).  Dr. Carin Primrose in agreement.  Rehab MD (Dr. Ranell Patrick) to assess pt and confirm admission on Saturday.  Floor RN can call CIR at [phone] for report after 12pm on Saturday.  I have let pt/family and case manager know.    Shann Medal, PT, DPT Admissions Coordinator [phone] 01/13/23  1:23 PM

## 2023-01-13 NOTE — Plan of Care (Signed)

## 2023-01-13 NOTE — Discharge Instructions (Addendum)
To Ms. Brieonna Sennett or their caretakers,  They were evaluated and treated in the hospital for:  Principal Problem:   Acute hypoxic respiratory failure ([place]) Active Problems:   Diabetes ([place])   Atrial fibrillation with RVR ([place])   Macrocytic anemia  Resolved Problems:   Sepsis ([place])   Pneumonia  The evaluation suggested multifocal pneumonia due to aspiration in addition to exacerbation of subacute to chronic underlying pulmonary disease. They were treated IV antibiotics, supplemental oxygen, and steroids.  They were discharged from the hospital on 01/14/23 to inpatient rehab. I recommend the following after leaving the hospital:   Start the following medicines: - Prednisone 40 mg daily through 01/22/2023, to taper by 10 mg weekly thereafter  The following medicines changed while you are hospitalized: - Apixaban decreased from 5 mg twice daily to 2.5 mg twice daily - Metoprolol increased from 25 mg twice daily to 25 mg 3 times daily -- nystatin mouth wash for 7 days and magic mouth wash with meals. If burning on tongue persists then please follow-up with Primary care doctor.  Take the rest of your medications as described in your up to date medication list, included with this paperwork.  You will be discharged with supplemental oxygen to use at home.  Wear this even if you do not feel short of breath until your doctor tells you it is okay to stop.  It will be important to follow-up with a lung doctor outside of the hospital.  An appointment with a pulmonologist has been made for Feb 22, 2023.  You are at risk for aspiration during eating or drinking.  This is when food or drink goes into the windpipe.  This can cause pneumonia.  Eat and drink slowly.  Continue taking pantoprazole for gastroesophageal reflux disease.  [name] MD 01/13/2023, 1:01 PM

## 2023-01-13 NOTE — Discharge Summary (Incomplete)
Name: [name] MRN: [id number] DOB: 08/09/1941 82 y.o. PCP: Haydee Salter, MD  Date of Admission: 01/04/2023  9:05 AM Date of Discharge: 01/14/23 Attending Physician: Lucious Groves, DO  Discharge Diagnosis: Principal Problem:   Acute hypoxic respiratory failure ([place]) Active Problems:   Diabetes ([place])   Atrial fibrillation with RVR ([place])   Macrocytic anemia  Resolved Problems:   Sepsis ([place])   Pneumonia   Discharge Medications: Allergies as of 01/14/2023       Reactions   Bactrim [sulfamethoxazole-trimethoprim] Cough   And flushing of face and conjunctivitis.         Medication List     STOP taking these medications    augmented betamethasone dipropionate 0.05 % ointment Commonly known as: DIPROLENE-AF   fluticasone 50 MCG/ACT nasal spray Commonly known as: FLONASE   HYDROcodone-acetaminophen 5-325 MG tablet Commonly known as: NORCO/VICODIN   hydrocortisone 2.5 % cream   ipratropium 0.02 % nebulizer solution Commonly known as: ATROVENT   levalbuterol 0.63 MG/3ML nebulizer solution Commonly known as: XOPENEX   methylPREDNISolone 4 MG Tbpk tablet Commonly known as: MEDROL DOSEPAK       TAKE these medications    acetaminophen 325 MG tablet Commonly known as: TYLENOL Take 3 tablets (975 mg total) by mouth every 6 (six) hours as needed for mild pain, headache or fever. What changed: how much to take   apixaban 2.5 MG Tabs tablet Commonly known as: ELIQUIS Take 1 tablet (2.5 mg total) by mouth 2 (two) times daily. What changed:  medication strength how much to take   benzonatate 100 MG capsule Commonly known as: TESSALON Take 1 capsule (100 mg total) by mouth 2 (two) times daily as needed for cough.   CALCIUM-CARB 600 PO Take 1 tablet by mouth daily. Gummy   cetirizine 10 MG tablet Commonly known as: ZYRTEC Take 10 mg daily as needed by mouth (seasonal allergies).   chlorpheniramine-HYDROcodone 10-8 MG/5ML Commonly known as:  TUSSIONEX Take 5 mLs by mouth at bedtime as needed for cough. What changed: when to take this   docusate sodium 100 MG capsule Commonly known as: COLACE Take 1 capsule (100 mg total) by mouth daily.   DULoxetine 30 MG capsule Commonly known as: CYMBALTA Take 30 mg by mouth at bedtime.   Forteo 600 MCG/2.4ML Sopn Generic drug: Teriparatide (Recombinant) Inject 20 mcg into the skin at bedtime.   guaiFENesin 600 MG 12 hr tablet Commonly known as: MUCINEX Take 1 tablet (600 mg total) by mouth 2 (two) times daily as needed for cough or to loosen phlegm.   ipratropium-albuterol 0.5-2.5 (3) MG/3ML Soln Commonly known as: DUONEB Take 3 mLs by nebulization every 4 (four) hours as needed.   lidocaine 5 % Commonly known as: LIDODERM Place 2 patches onto the skin daily as needed (back pain). Remove & Discard patch within 12 hours or as directed by MD   magic mouthwash w/lidocaine Soln Take 1 mL by mouth every 4 (four) hours as needed for mouth pain.   metFORMIN 500 MG tablet Commonly known as: GLUCOPHAGE Take 1 tablet (500 mg total) by mouth 2 (two) times daily with a meal.   metoprolol tartrate 25 MG tablet Commonly known as: LOPRESSOR Take 1 tablet (25 mg total) by mouth 3 (three) times daily. What changed:  medication strength how much to take when to take this   mometasone 50 MCG/ACT nasal spray Commonly known as: Nasonex Place 2 sprays into the nose daily as needed (Rhinitis).   nitroGLYCERIN  0.4 MG SL tablet Commonly known as: NITROSTAT Place 1 tablet (0.4 mg total) under the tongue every 5 (five) minutes as needed for chest pain.   nystatin 100000 UNIT/ML suspension Commonly known as: MYCOSTATIN Use as directed 5 mLs (500,000 Units total) in the mouth or throat 4 (four) times daily for 6 days.   pantoprazole 40 MG tablet Commonly known as: PROTONIX Take 1 tablet (40 mg total) by mouth daily as needed (Indigestion).   pravastatin 20 MG tablet Commonly known as:  PRAVACHOL Take 1 tablet (20 mg total) by mouth at bedtime.   predniSONE 10 MG tablet Commonly known as: DELTASONE Take 4 tablets (40 mg total) by mouth daily for 10 days, THEN 3 tablets (30 mg total) daily for 7 days, THEN 2 tablets (20 mg total) daily for 7 days, THEN 1 tablet (10 mg total) daily for 7 days. Start taking on: January 14, 2023   predniSONE 20 MG tablet Commonly known as: DELTASONE Take 2 tablets (40 mg total) by mouth daily with breakfast for 7 days. Start taking on: January 15, 2023   predniSONE 10 MG tablet Commonly known as: DELTASONE Take 3 tablets (30 mg total) by mouth daily with breakfast. Start taking on: January 23, 2023   predniSONE 20 MG tablet Commonly known as: DELTASONE Take 1 tablet (20 mg total) by mouth daily with breakfast. Start taking on: January 30, 2023   predniSONE 10 MG tablet Commonly known as: DELTASONE Take 1 tablet (10 mg total) by mouth daily with breakfast. Start taking on: February 06, 2023   senna-docusate 8.6-50 MG tablet Commonly known as: Senokot-S Take 1 tablet by mouth at bedtime as needed for mild constipation.   traMADol 50 MG tablet Commonly known as: ULTRAM Take 1 tablet (50 mg total) by mouth every 8 (eight) hours as needed for moderate pain.   Vitamin D 50 MCG (2000 UT) tablet Take 1 tablet (2,000 Units total) by mouth daily.   vitamin k 100 MCG tablet Take 100 mcg by mouth daily.               Durable Medical Equipment  (From admission, onward)           Start     Ordered   01/12/23 0849  For home use only DME oxygen  Once       Question Answer Comment  Length of Need Lifetime   Oxygen delivery system Gas      01/12/23 0849   01/11/23 0654  For home use only DME Hospital bed  Once       Question Answer Comment  Length of Need 6 Months   Patient has (list medical condition): hypoxic respiratory failure   The above medical condition requires: Patient requires the ability to reposition frequently   Head  must be elevated greater than: 30 degrees   Bed type Semi-electric      01/11/23 0654            Follow-up Appointments:  Follow-up Information     Maryjane Hurter, MD. Go on 02/22/2023.   Specialty: Pulmonary Disease Contact information: [place] [address] [phone]         Haydee Salter, MD. Call today.   Specialty: Internal Medicine Why: Call today for an appointment as soon as possible after leaving the hospital. Contact information: [place] [address] [phone]  Disposition and follow-up: Ms. Francie Abellera is a 82 y.o. year old hospitalized for sepsis and acute hypoxic respiratory failure due to pneumonia, with exacerbation of underlying subacute to chronic pulmonary disease.  Acute hypoxic respiratory failure No supplemental home O2 prior to admission.  Status post Unasyn and azithromycin for presumed aspiration pneumonia.  Discharged on prednisone taper for persistent wheeze despite resolution of infection.  Instructed to follow-up with pulmonology. - Prednisone 40 mg through 01/22/2023, to taper by 10 mg weekly thereafter - Pulmonology follow-up - DME supplemental oxygen, 2 L/min  Dysphagia Functional decline MDS and barium esophagram within normal limits for age.  Empiric PPI started for GERD and functional dysphagia.  Dysphagia 3 diet with thin liquids while hospitalized. - Pantoprazole 40 mg daily  Macrocytic anemia B12 and folate within normal limits.  Multiple myeloma panel negative.  Iron panel not suggestive of deficiency.  Mouth pain- ensure resolution after nystatin wash for 7 days.  Hospital Course by problem list: Principal Problem:   Acute hypoxic respiratory failure ([place]) Active Problems:   Diabetes ([place])   Atrial fibrillation with RVR ([facility])   Macrocytic anemia  Resolved Problems:   Sepsis ([place])   Pneumonia  Acute hypoxic respiratory failure Sepsis Aspiration  pneumonia Exacerbation of underlying subacute or chronic lung disease Patient presented for several days of cough and dyspnea associated with fever to 103 Fahrenheit at home.  She was tachycardic with leukocytosis and intermittently desaturating on room air.  Admitted for sepsis and respiratory failure due to multifocal pneumonia on imaging.  Daughter reported concern for aspiration so she was treated with Unasyn and azithromycin.  Sepsis quickly resolved.  She was noted to have significant wheezing and was treated with intermittent IV methylprednisolone.  Despite completing a course of antibiotics and resolving signs of infection, the patient remained dependent on supplemental oxygen.  Repeat viral testing was negative.  Patient was not able to produce good-quality sputum for sample.  TTE with evidence of pulmonary arterial hypertension and marginally elevated right heart pressure.  LVEF within normal limits.  Pulmonology consulted out of concern for subacute to chronic lung disease.  Steroid taper was started for organizing pneumonia versus ILD, she will follow-up with pulmonology as outpatient.  Discharged with supplemental oxygen and acute inpatient rehab.  Chronic A-fib RVR Tachycardic on admission.  Improved somewhat but heart rate remained stable between 90s and 120s.  Metoprolol was increased after several days in the hospital to good effect.  Apixaban decreased from 5 twice daily to 2.5 twice daily based on patient age and weight.  Dysphagia Functional decline Family reports steady decline in functional status over the past 3 to 6 months. Has been marked by a severe bout of RSV pneumonia requiring hospitalization in January 2024 and a couple of falls without severe injury.  Family notes some weight loss and less appetite.  They also report periods of coughing and choking after eating or drinking.  This person was being evaluated in the outpatient setting for dysphagia.  Modified barium swallow and  esophagram were essentially within normal limits for this patient's age.  She was ordered a dysphagia diet with thin liquids and started on daily proton pump inhibitor for empiric treatment of GERD and functional dysphagia.  Macrocytic anemia Macrocytosis noted as early as January 2022.  Anemia intermittent since 2019 with marked worsening during this hospitalization.  B12 and folate levels tested have been normal.  Notably this person's chest CT from this admission showed some lytic appearing lesions and nondisplaced  manubrial fracture, thought to be secondary to prior fall where she injured her chest.  However given the concomitant macrocytic anemia, multiple myeloma workup was pursued which was normal.  She was also iron replete.  Wonder about a smoldering myelodysplastic process.  Mouth pain/Oral Thrush Patient's daughter noted pain of tongue with eating. On initial exam, there was white lesions that were removable with tongue compression consistent with Thrush. This improved with Nystatin and she was discharged with to complete 7 day course. She continued to have some pain on tongue, but no sores present on exam. Discharged with magic mouthwash. If pain persists she will need further follow-up as outpatient.  Discharge Exam: Feeling better today.  Good appetite.  Sleep was better overnight.  Discussed discharge planning with daughter, they will go to inpatient rehab this afternoon.   Blood pressure 96/61, pulse 97, temperature 98.4 F (36.9 C), temperature source Oral, resp. rate 18, height 4\' 8"  (1.422 m), weight 61 kg, SpO2 95 %.  Sitting upright in bed eating breakfast Heart rate normal, rhythm irregularly irregular, strong radial pulse Breathing is regular and unlabored on 2 L via nasal cannula Skin is warm and dry Alert and oriented Petechiae noted to bottom of tongue, no sores at site of pain on tongue.  Pertinent studies and procedures: Transthoracic echo Imaging Orders         DG  Chest Portable 1 View         CT Angio Chest Pulmonary Embolism (PE) W or WO Contrast         DG Swallowing Func-Speech Pathology         DG CHEST PORT 1 VIEW         DG CHEST PORT 1 VIEW         CT CHEST WO CONTRAST         DG ESOPHAGUS W SINGLE CM (SOL OR THIN BA)    Lab Orders         Resp panel by RT-PCR (RSV, Flu A&B, Covid) Anterior Nasal Swab         Blood culture (routine x 2)         Respiratory (~20 pathogens) panel by PCR         MRSA Next Gen by PCR, Nasal         Resp panel by RT-PCR (RSV, Flu A&B, Covid) Anterior Nasal Swab         Resp panel by RT-PCR (RSV, Flu A&B, Covid) Anterior Nasal Swab         Expectorated Sputum Assessment w Gram Stain, Rflx to Resp Cult         Acid Fast Smear (AFB)         Acid Fast Culture with reflexed sensitivities         CBC with Differential         Comprehensive metabolic panel         Lipase, blood         Lactic acid, plasma         Basic metabolic panel         CBC         Magnesium         Strep pneumoniae urinary antigen         Glucose, capillary         Lactic acid, plasma         Glucose, capillary         Folate  Basic metabolic panel         CBC         Immunofixation electrophoresis         Protein electrophoresis, serum         Kappa/lambda light chains         Basic metabolic panel         CBC         Basic metabolic panel         CBC         Basic metabolic panel         CBC         Basic metabolic panel         CBC         Magnesium         Procalcitonin         Legionella Pneumophila Serogp 1 Ur Ag         Urinalysis, Complete w Microscopic -Urine, Clean Catch         TSH         Blood gas, venous         Basic metabolic panel         CBC         ANA w/Reflex if Positive         CK         Rheumatoid factor         CYCLIC CITRUL PEPTIDE ANTIBODY, IGG/IGA         ANCA Titers         Basic metabolic panel         CBC         Ferritin         Iron and TIBC         Basic metabolic panel          CBC         I-Stat arterial blood gas, ED     Discharge Instructions:   Discharge Instructions      To Ms. Needmore or their caretakers,  They were evaluated and treated in the hospital for:  Principal Problem:   Acute hypoxic respiratory failure ([place]) Active Problems:   Diabetes ([place])   Atrial fibrillation with RVR ([place])   Macrocytic anemia  Resolved Problems:   Sepsis ([place])   Pneumonia  The evaluation suggested multifocal pneumonia due to aspiration in addition to exacerbation of subacute to chronic underlying pulmonary disease. They were treated IV antibiotics, supplemental oxygen, and steroids.  They were discharged from the hospital on 01/14/23 to inpatient rehab. I recommend the following after leaving the hospital:   Start the following medicines: - Prednisone 40 mg daily through 01/22/2023, to taper by 10 mg weekly thereafter  The following medicines changed while you are hospitalized: - Apixaban decreased from 5 mg twice daily to 2.5 mg twice daily - Metoprolol increased from 25 mg twice daily to 25 mg 3 times daily -- nystatin mouth wash for 7 days and magic mouth wash with meals. If burning on tongue persists then please follow-up with Primary care doctor.  Take the rest of your medications as described in your up to date medication list, included with this paperwork.  You will be discharged with supplemental oxygen to use at home.  Wear this even if you do not feel short of breath until your doctor tells you it is okay to stop.  It will be important to follow-up with  a lung doctor outside of the hospital.  An appointment with a pulmonologist has been made for Feb 22, 2023.  You are at risk for aspiration during eating or drinking.  This is when food or drink goes into the windpipe.  This can cause pneumonia.  Eat and drink slowly.  Continue taking pantoprazole for gastroesophageal reflux disease.  [name] MD 01/13/2023, 1:01 PM

## 2023-01-13 NOTE — PMR Pre-admission (Signed)
PMR Admission Coordinator Pre-Admission Assessment  Patient: [name] is an 82 y.o., female MRN: [id number] DOB: 06-Mar-1941 Height: 4\' 8"  (142.2 cm) Weight: 61 kg  Insurance Information HMO:     PPO:      PCP:      IPA:      80/20:      OTHER:  PRIMARY: Medicare A/B      Policy#: [id number]       Subscriber: pt CM Name:       Phone#:      Fax#:  Pre-Cert#: verified online      Employer:  Benefits:  Phone #:      Name:  Eff. Date: A 12/16/06, B 08/17/06     Deduct: [id number]      Out of Pocket Max: n/a      Life Max: n/a CIR: 100%      SNF: 20 full days Outpatient: 80%     Co-Pay: 20% Home Health: 100%      Co-Pay:  DME: 80%     Co-Pay: 20% Providers:  SECONDARY: Medicaid [place] [employer]      Policy#: [id number]     Phone#:   Financial Counselor:       Phone#:   The "Data Collection Information Summary" for patients in Inpatient Rehabilitation Facilities with attached "Privacy Act [place] Records" was provided and verbally reviewed with: Patient and Family  Emergency Contact Information Contact Information     Name Relation Home Work [place] Daughter   [phone]   Nguyen,(SIL)[name] Relative   [phone]   Sylvie Farrier   [phone]       Current Medical History  Patient Admitting Diagnosis: debility   History of Present Illness: [name] is an 81-year right-handed [place] speaking female with history of diabetes mellitus, atrial fibrillation currently maintained on Eliquis, chronic back pain, hyperlipidemia subdural hematoma after fall 2015 while on Coumadin.  Presented 01/04/2023 with increasing difficulties breathing and a cough as well as fever of 103.  Chest x-ray with no acute process.  CT angiogram of the chest no pulmonary emboli identified.  Admission chemistries unremarkable except glucose 152, coronavirus negative, WBC 14,100, hemoglobin 9.8, blood cultures no growth to date, lactic acid 2.6.  Placed on broad-spectrum antibiotics initiated and  completed as well as prednisone taper that she will remain on through 01/22/2023.  Echocardiogram with ejection fraction of 60 to 65% no wall motion abnormalities.  Still using nasal cannula oxygen to maintain saturations.  Tolerating a mechanical soft diet.  Therapy evaluations completed and pt was recommended for a comprehensive rehab program.     Patient's medical record from [facility] has been reviewed by the rehabilitation admission coordinator and physician.  Past Medical History  Past Medical History:  Diagnosis Date   Atrial fibrillation ([place])    Chronic back pain    "mid back down into lower back" (08/25/2014)   Diabetes mellitus without complication (HCC)    Frequent falls    GERD (gastroesophageal reflux disease)    Hypertriglyceridemia    OSA on CPAP    Osteoarthritis    "knees, hands, back" (08/25/2014)   Pneumonia ~ 2000 X 1   Subdural hematoma ([place]) july 2015   S/P fall while on Coumadin   T12 compression fracture ([place]) 2012   Type II diabetes mellitus ([place])     Has the patient had major surgery during 100 days prior to admission? No  Family History   family history is not on file.  Current Medications  Current Facility-Administered Medications:    acetaminophen (TYLENOL) tablet 650 mg, 650 mg, Oral, Q6H PRN, Angelique Blonder, DO, 650 mg at 01/09/23 1030   apixaban (ELIQUIS) tablet 2.5 mg, 2.5 mg, Oral, BID, [name], MD, 2.5 mg at 01/13/23 [id number]   benzonatate (TESSALON) capsule 100 mg, 100 mg, Oral, BID PRN, Delene Ruffini, MD, 100 mg at 01/11/23 2232   chlorpheniramine-HYDROcodone (TUSSIONEX) 10-8 MG/5ML suspension 5 mL, 5 mL, Oral, QHS PRN, Angelique Blonder, DO, 5 mL at 01/12/23 0058   docusate sodium (COLACE) capsule 100 mg, 100 mg, Oral, Daily, [name], MD, 100 mg at 01/13/23 0936   DULoxetine (CYMBALTA) DR capsule 30 mg, 30 mg, Oral, QHS, [name], MD, 30 mg at 01/12/23 2134   guaiFENesin (MUCINEX) 12 hr tablet 600 mg, 600 mg, Oral,  BID, Angelique Blonder, DO, 600 mg at 01/13/23 0936   insulin aspart (novoLOG) injection 0-15 Units, 0-15 Units, Subcutaneous, TID WC, [name], MD, 8 Units at 01/13/23 1149   ipratropium-albuterol (DUONEB) 0.5-2.5 (3) MG/3ML nebulizer solution 3 mL, 3 mL, Nebulization, Q4H PRN, Masters, Katie, DO, 3 mL at 01/12/23 2048   lidocaine (LIDODERM) 5 % 2 patch, 2 patch, Transdermal, Daily PRN, [name], MD   metoprolol tartrate (LOPRESSOR) tablet 25 mg, 25 mg, Oral, TID, [name], MD, 25 mg at 01/13/23 0936   nystatin (MYCOSTATIN) 100000 UNIT/ML suspension 500,000 Units, 5 mL, Mouth/Throat, QID, [name], MD, 500,000 Units at 01/13/23 0937   pantoprazole (PROTONIX) EC tablet 40 mg, 40 mg, Oral, Daily, Angelique Blonder, DO, 40 mg at 01/13/23 [id number]   polyethylene glycol (MIRALAX / GLYCOLAX) packet 17 g, 17 g, Oral, Daily PRN, [name], MD, 17 g at 01/05/23 1004   pravastatin (PRAVACHOL) tablet 20 mg, 20 mg, Oral, QHS, [name], MD, 20 mg at 01/12/23 2135   predniSONE (DELTASONE) tablet 40 mg, 40 mg, Oral, Q breakfast, 40 mg at 01/13/23 0630 **FOLLOWED BY** [START ON 01/23/2023] predniSONE (DELTASONE) tablet 30 mg, 30 mg, Oral, Q breakfast **FOLLOWED BY** [START ON 01/30/2023] predniSONE (DELTASONE) tablet 20 mg, 20 mg, Oral, Q breakfast **FOLLOWED BY** [START ON 02/06/2023] predniSONE (DELTASONE) tablet 10 mg, 10 mg, Oral, Q breakfast, Joni Reining C, DO  Patients Current Diet:  Diet Order             DIET DYS 3 Room service appropriate? No; Fluid consistency: Thin  Diet effective now                   Precautions / Restrictions Precautions Precautions: Fall Precaution Comments: multiple falls, chronic L hemiplegia Restrictions Weight Bearing Restrictions: No   Has the patient had 2 or more falls or a fall with injury in the past year? Yes  Prior Activity Level Household: only went out for Dr appointments, RW at baseline with intermittent  supervision for mobility and ADLs  Prior Functional Level Self Care: Did the patient need help bathing, dressing, using the toilet or eating? Independent  Indoor Mobility: Did the patient need assistance with walking from room to room (with or without device)? Independent  Stairs: Did the patient need assistance with internal or external stairs (with or without device)? Needed some help  Functional Cognition: Did the patient need help planning regular tasks such as shopping or remembering to take medications? Needed some help  Patient Information Are you of Hispanic, Latino/a,or Spanish origin?: A. No, not of Hispanic, Latino/a, or Spanish origin, X. Patient unable to respond (per proxy) What is your race?:  I. Vietnamese, X. Patient unable to respond Do you need or want an interpreter to communicate with a doctor or health care staff?: 9. Unable to respond  Patient's Response To:  Health Literacy and Transportation Is the patient able to respond to health literacy and transportation needs?: No Health Literacy - How often do you need to have someone help you when you read instructions, pamphlets, or other written material from your doctor or pharmacy?: Patient unable to respond In the past 12 months, has lack of transportation kept you from medical appointments or from getting medications?: No (proxy) In the past 12 months, has lack of transportation kept you from meetings, work, or from getting things needed for daily living?: No (proxy)  [place]: Rollator (4 wheels), Rolling Walker (2 wheels), Tub bench, BSC/3in1  Prior Device Use: Indicate devices/aids used by the patient prior to current illness, exacerbation or injury? Walker  Current Functional Level Cognition  Overall Cognitive Status: History of cognitive impairments - at baseline Difficult to assess due to: Non-English speaking Orientation Level: Oriented X4 General Comments: Daughter  [place] there to translate and feels pt is at baseline. Pt has cognitive deficits at baseline but very motivated and reponds better to Errorless learning strategy for safety cues with transfers/gait.    Extremity Assessment (includes Sensation/Coordination)  Upper Extremity Assessment: Generalized weakness  Lower Extremity Assessment: Defer to PT evaluation RLE Deficits / Details: ROM WFL, strength grossly 3+/5, rubs R knee after ambulation but denies pain LLE Deficits / Details: ROM WFL, strength grossly 3+?5    ADLs  Overall ADL's : Needs assistance/impaired Eating/Feeding: Set up, Sitting Grooming: Minimal assistance, Sitting Grooming Details (indicate cue type and reason): Pt with posterior lean when standing. Had focused on toileting;therefore did not stand to groom. Upper Body Bathing: Minimal assistance, Sitting Lower Body Bathing: Moderate assistance, Sitting/lateral leans Upper Body Dressing : Set up, Sitting Lower Body Dressing: Moderate assistance, Sitting/lateral leans Lower Body Dressing Details (indicate cue type and reason): assist to start pullup over legs Toilet Transfer: Minimal assistance, Stand-pivot, BSC/3in1 Toilet Transfer Details (indicate cue type and reason): Pt with posterior lean Toileting- Clothing Manipulation and Hygiene: Maximal assistance Toileting - Clothing Manipulation Details (indicate cue type and reason): Pt attempted to clean self while therapist provided mod assist to stand due to posterior lean Functional mobility during ADLs: Moderate assistance, Rolling walker (2 wheels) General ADL Comments: Pt more awake and participatory today.  Pt continues to have weakness and is very unsteady on feet.  Pt will need someone with her all the time at d/c    Mobility  Overal bed mobility: Needs Assistance Bed Mobility: Supine to Sit Supine to sit: Mod assist Sit to supine: Max assist, +2 for physical assistance General bed mobility comments: Pt in chair on  arrival and wanted to stay up    Transfers  Overall transfer level: Needs assistance Equipment used: Rolling walker (2 wheels) Transfers: Sit to/from Stand Sit to Stand: Min assist, +2 safety/equipment, Mod assist Bed to/from chair/wheelchair/BSC transfer type:: Step pivot Step pivot transfers: Mod assist General transfer comment: hand over hand assist for safe UE placement with each transfer with specific cues provided, pt responds better wtih Errorless Learning strategy due to hx mild cognitive impairment. Loss of balance at times requiring increased assist for lowering as she fatigued up to [place] / Gait / Stairs / Wheelchair Mobility  Ambulation/Gait Ambulation/Gait assistance: Min assist, Mod assist, +2 safety/equipment Gait Distance (  Feet): 25 Feet (47ft, 84ft, 57ft with seated breaks between all trials to recover SpO2) Assistive device: Rolling walker (2 wheels) Gait Pattern/deviations: Step-through pattern, Decreased dorsiflexion - left, Decreased weight shift to right, Step-to pattern, Trunk flexed General Gait Details: Pt SpO2 with poor signal due to grip of fingers on RW, however on RA SpO2 remains low with standing break as pt resting her hands, SpO2 82% on RA so pt increased to 2L and SpO2 improved to >92% within 30 seconds at rest. Pt SpO2 reading 87-94% on 2L for remainder of functional mobility with at times poor signal, tending to read better on her thumbs than on other fingers. Pt needing minA initially but with increased distance and LLE fatigue, pt needs intermittent modA for AD use and LLE stepping. Pt with decreased L dorsiflexion at baseline and may benefit from LLE AFO pending progress as L foot catching floor is greatly increasing her fall risk, MD notified. Chair follow for safety. Gait velocity: slowed Gait velocity interpretation: <1.31 ft/sec, indicative of household ambulator Pre-gait activities: shuffled steps toward her L side and anterior for pivotal  transfer to recliner from EOB using RW, too fatigued for gait trial Stairs:  (pt not able this date, too deconditioned/fatigued)    Posture / Balance Dynamic Sitting Balance Sitting balance - Comments: L lateral lean Balance Overall balance assessment: Needs assistance Sitting-balance support: Feet supported, No upper extremity supported Sitting balance-Leahy Scale: Fair Sitting balance - Comments: L lateral lean Postural control: Left lateral lean, Posterior lean Standing balance support: Bilateral upper extremity supported, During functional activity, Reliant on assistive device for balance Standing balance-Leahy Scale: Poor Standing balance comment: requires UE support and external assist with gait belt for maintaining balance, multiple posterior LOB even with RW support during standing rest breaks    Special needs/care consideration Oxygen 2L, Diabetic management yes, and Special service needs [place] (from acute therapy documentation) Living Arrangements: Children Available Help at Discharge: Family Type of Home: House Home Layout: Two level, Bed/bath upstairs Alternate Level Stairs-Rails: Right Alternate Level Stairs-Number of Steps: 12 Home Access: Stairs to enter Entrance Stairs-Rails: Left Entrance Stairs-Number of Steps: 2 Bathroom Shower/Tub: Chiropodist: Handicapped height Bathroom Accessibility: Yes  Discharge Living Setting Plans for Discharge Living Setting: Lives with (comment) (daughter, Terease) Type of Home at Discharge: House Discharge Home Layout: Two level, Bed/bath upstairs Alternate Level Stairs-Rails: Right Alternate Level Stairs-Number of Steps: 12 Discharge Home Access: Stairs to enter Entrance Stairs-Rails: Left Entrance Stairs-Number of Steps: 2 Discharge Bathroom Shower/Tub: Tub/shower unit Discharge Bathroom Toilet: Handicapped height Discharge Bathroom Accessibility: Yes How  Accessible: Accessible via walker Does the patient have any problems obtaining your medications?: No  Social/Family/Support Systems Anticipated Caregiver: daughter, Jaylaa Noda Anticipated Caregiver's Contact Information: [phone] Ability/Limitations of Caregiver: supervision Caregiver Availability: 24/7 Discharge Plan Discussed with Primary Caregiver: Yes Is Caregiver In Agreement with Plan?: Yes Does Caregiver/Family have Issues with Lodging/Transportation while Pt is in Rehab?: No  Goals Patient/Family Goal for Rehab: PT/OT supervision, SLP n/a Expected length of stay: 7-10 days Cultural Considerations: vietnamese Additional Information: discharge plan: to daughter's home with 24/7 supervision from daughter and other family Pt/Family Agrees to Admission and willing to participate: Yes Program Orientation Provided & Reviewed with Pt/Caregiver Including Roles  & Responsibilities: Yes  Decrease burden of Care through IP rehab admission: n/a  Possible need for SNF placement upon discharge: Not anticipated.  Discharge plan to daughter's home with family providing 24/7 supervision.  Patient Condition: I have reviewed medical records from [facility], spoken with CM, and patient and daughter. I met with patient at the bedside for inpatient rehabilitation assessment.  Patient will benefit from ongoing PT and OT, can actively participate in 3 hours of therapy a day 5 days of the week, and can make measurable gains during the admission.  Patient will also benefit from the coordinated team approach during an Inpatient Acute Rehabilitation admission.  The patient will receive intensive therapy as well as Rehabilitation physician, nursing, social worker, and care management interventions.  Due to bladder management, bowel management, safety, skin/wound care, disease management, medication administration, and pain management the patient requires 24 hour a day rehabilitation nursing.  The patient is  currently min assist with mobility and basic ADLs.  Discharge setting and therapy post discharge at home with home health is anticipated.  Patient has agreed to participate in the Acute Inpatient Rehabilitation Program and will admit Saturday 01/14/23.  Preadmission Screen Completed By:  Michel Santee, PT, DPT 01/13/2023 1:28 PM ______________________________________________________________________   Discussed status with Dr. Ranell Patrick on 01/13/23  at 1:34 PM  and received approval for admission today.  Admission Coordinator:  Michel Santee, PT, time 1:34 PM Sudie Grumbling 01/13/23    Assessment/Plan: Diagnosis: Does the need for close, 24 hr/day Medical supervision in concert with the patient's rehab needs make it unreasonable for this patient to be served in a less intensive setting? {yes_no_potentially:3041433} Co-Morbidities requiring supervision/potential complications: *** Due to {due WC:4653188, does the patient require 24 hr/day rehab nursing? {yes_no_potentially:3041433} Does the patient require coordinated care of a physician, rehab nurse, PT, OT, and SLP to address physical and functional deficits in the context of the above medical diagnosis(es)? {yes_no_potentially:3041433} Addressing deficits in the following areas: {deficits:3041436} Can the patient actively participate in an intensive therapy program of at least 3 hrs of therapy 5 days a week? {yes_no_potentially:3041433} The potential for patient to make measurable gains while on inpatient rehab is {potential:3041437} Anticipated functional outcomes upon discharge from inpatient rehab: {functional outcomes:304600100} PT, {functional outcomes:304600100} OT, {functional outcomes:304600100} SLP Estimated rehab length of stay to reach the above functional goals is: *** Anticipated discharge destination: {anticipated dc setting:21604} 10. Overall Rehab/Functional Prognosis: {potential:3041437}   MD Signature: ***

## 2023-01-13 NOTE — Progress Notes (Addendum)
                 Interval history Improved from yesterday.  Better mobility.  Daughter present.  Both patient and daughter looking forward to acute rehab with CIR.  Physical exam Blood pressure 115/68, pulse 73, temperature 98.2 F (36.8 C), temperature source Oral, resp. rate 18, height 4\' 8"  (1.422 m), weight 61 kg, SpO2 99 %.  Comfortable appearing, upright and eating breakfast Heart rate normal, rhythm is irregularly irregular, strong radial pulse Breathing is regular and unlabored on 2 L/min Skin is warm and dry Alert  Assessment and plan Hospital day [place] is a 82 y.o. admitted for hypoxic respiratory failure due to aspiration pneumonia, status post antibiotics now on prednisone for presumed chronic subacute underlying pulmonary disease, stable for discharge and admission to CIR.  Principal Problem:   Acute hypoxic respiratory failure (HCC) Active Problems:   Diabetes ([place])   Atrial fibrillation with RVR (HCC)   Macrocytic anemia  Acute hypoxic respiratory failure Exacerbation of underlying subacute or chronic lung disease Status post Unasyn and azithromycin for aspiration pneumonia.  Now on prednisone taper for organizing pneumonia after January 2024 bout with RSV versus ILD.  Still requiring O2 to maintain saturations >88% on ambulation.  Stable for admission to CIR, anticipate tomorrow. - Prednisone 40 mg p.o. daily through 01/22/2023, to taper by 10 mg weekly - Scheduled guaifenesin - Benzonatate, Tussionex, DuoNebs as needed   Chronic A-fib RVR Rate in the 100s to 120s.  Continue telemetry monitoring. 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  Empiric PPI for GERD and functional dysphagia. - Pantoprazole 40 mg p.o. daily - Dysphagia 3 diet with thin liquids  Acute metabolic encephalopathy Waxing and waning course.  Improved today.  Hopeful for  continued improvement as acute rehab gets her up and moving about.  Echo showed EF within normal limits.  Some mildly elevated right heart pressures and pulmonary artery systolic pressure, but this is in line with her suspected chronic lung disease.   Diabetes Hyperglycemic this morning after large breakfast. - SSI moderate   Macrocytic anemia B12 and folate within normal limits.  Multiple myeloma panel negative.  Iron replete. - Follow-up outpatient  Diet: Dysphagia 3 with thin liquids IVF: None VTE: apixaban (ELIQUIS) tablet 2.5 mg Start: 01/11/23 1200  Code: Full PT/OT recommendations: CIR Family Update: At bedside   Discharge plan: CIR tomorrow.   [name] MD 01/13/2023, 1:18 PM  Pager: [phone] After 5pm or weekend: [phone]

## 2023-01-13 NOTE — Progress Notes (Addendum)
Physical Therapy Treatment Patient Details Name: [name] MRN: [id number] DOB: May 28, 1941 Today's Date: 01/13/2023   History of Present Illness 82 year old [place] female, presents to ED via EMS 3/20. Pt tachycardic with intermittent desaturations in high 80s on RA. Chest CT suggestive of PNA. Admitted for presumed sepsis due to PNA PMH Severe case if RSV leading to hospitalization 1/24, repeated falls, 2 requiring visits to the ED. DM2, A-fib, acute cerebral infarct, 2017.    PT Comments    Pt received in chair, pt and daughter pleasantly agreeable to therapy session, pt motivated to progress strength/endurance with excellent participation in gait and transfer training. Emphasis on activity pacing/energy conservation (chair follow for safety, standing rest breaks), pursed-lip breathing with exertion while on RA, and monitoring RPE to prevent over-exertion. Pt making good progress toward goals, able to achieve longer distance gait trials this date on RA, although possible mild desaturation, when pt loosened grip during standing break, SpO2 reading >92% on RA but moderate DOE and pt reports mild feeling of chest tightness with exertion, which resolves after pt takes seated rest break. Pt needing up to minA with dense safety cues (+2 for safety only) to perform all functional mobility tasks this date with RW. Pt continues to benefit from PT services to progress toward functional mobility goals. LLE AFO not yet delivered to the room at time of session.   Recommendations for follow up therapy are one component of a multi-disciplinary discharge planning process, led by the attending physician.  Recommendations may be updated based on patient status, additional functional criteria and insurance authorization.  Follow Up Recommendations       Assistance Recommended at Discharge Frequent or constant Supervision/Assistance  Patient can return home with the following Assistance with  cooking/housework;Direct supervision/assist for medications management;Direct supervision/assist for financial management;Assist for transportation;Help with stairs or ramp for entrance;Two people to help with walking and/or transfers;A lot of help with bathing/dressing/bathroom   Equipment Recommendations  None recommended by PT;Other (comment) (MD agreeable to LLE AFO orthotist consult (MD placed order 3/28 PM))    Recommendations for Other Services Rehab consult     Precautions / Restrictions Precautions Precautions: Fall Precaution Comments: multiple falls, chronic L hemiplegia (L AFO ordered from [facility] 3/28 PM) Restrictions Weight Bearing Restrictions: No     Mobility  Bed Mobility               General bed mobility comments: Pt in chair on arrival and wanted to stay up in chair post-ambulation    Transfers Overall transfer level: Needs assistance Equipment used: Rolling walker (2 wheels) Transfers: Sit to/from Stand Sit to Stand: Min assist           General transfer comment: hand over hand assist for safe UE placement with initial transfer with specific cues provided, pt responds better wtih Errorless Learning strategy due to hx mild cognitive impairment. MinA lift and lowering assist this date.    Ambulation/Gait Ambulation/Gait assistance: Min assist, +2 safety/equipment Gait Distance (Feet): 50 Feet (x2 with seated break between (136ft total)) Assistive device: Rolling walker (2 wheels) Gait Pattern/deviations: Step-through pattern, Decreased dorsiflexion - left, Decreased weight shift to right, Step-to pattern, Trunk flexed, Decreased step length - left (LLE hip ER) Gait velocity: slowed     General Gait Details: Pt SpO2 with poor signal due to grip of fingers on RW, however with stanidn break as pt loosens her grip, SpO2 reading 92% and greater on RA after ~30 seconds once a good waveform appears. Pt  does c/o increased work of breathing with exertion, so  she may benefit from O2 for longer gait trials in future session. RN notified of pt c/o mild chest tightness/dyspnea with significant fatigue, pt states this goes away when she sits or takes a standing break. SpO2 WFL on RA prior to gait trial as well. Close chair follow for pt safety. Occasional tapping to L quad when encouraging her to take longer strides on LLE or when pt appearing to shuffle LLE more as she fatigues.   Stairs Stairs:  (defer due to pt fatigue after ambulation)           Wheelchair Mobility    Modified Rankin (Stroke Patients Only)       Balance Overall balance assessment: Needs assistance Sitting-balance support: Feet supported, No upper extremity supported Sitting balance-Leahy Scale: Fair     Standing balance support: Bilateral upper extremity supported, During functional activity, Reliant on assistive device for balance Standing balance-Leahy Scale: Poor Standing balance comment: requires UE support and external assist with gait belt for maintaining balance                            Cognition Arousal/Alertness: Awake/alert Behavior During Therapy: WFL for tasks assessed/performed Overall Cognitive Status: History of cognitive impairments - at baseline                                 General Comments: Daughter Yvaine there to translate and feels pt is at baseline. Pt has cognitive deficits at baseline but very motivated and reponds better to Errorless learning strategy for safety cues with transfers/gait. Reviewed this technique with daughter Zanaya and she is agreeable. Dense cues for activity pacing, L foot placement and RW proximity, some carryover noted from previous session.        Exercises Other Exercises Other Exercises: seated BLE AROM: LAQ x5 reps ea Other Exercises: IS x 10 reps (pt achieves ~259mL with cues)    General Comments General comments (skin integrity, edema, etc.): SpO2 92% and greater when good waveform  achieved      Pertinent Vitals/Pain Pain Assessment Pain Assessment: Faces Faces Pain Scale: Hurts little more ("medium" pain per pt) Pain Location: intermittent low back pain with prolonged standing Pain Descriptors / Indicators: Discomfort, Grimacing Pain Intervention(s): Monitored during session, Limited activity within patient's tolerance, Repositioned (pt defers pain meds or Tylenol, despite discussion that this may help her walk further)     PT Goals (current goals can now be found in the care plan section) Acute Rehab PT Goals Patient Stated Goal: to walk and get stronger before I go home PT Goal Formulation: With patient Time For Goal Achievement: 01/19/23 Progress towards PT goals: Progressing toward goals    Frequency    Min 1X/week      PT Plan Current plan remains appropriate       AM-PAC PT "6 Clicks" Mobility   Outcome Measure  Help needed turning from your back to your side while in a flat bed without using bedrails?: A Little Help needed moving from lying on your back to sitting on the side of a flat bed without using bedrails?: A Lot Help needed moving to and from a bed to a chair (including a wheelchair)?: A Lot Help needed standing up from a chair using your arms (e.g., wheelchair or bedside chair)?: A Lot (mod cues) Help needed to  walk in hospital room?: A Lot (mod cues) Help needed climbing 3-5 steps with a railing? : Total 6 Click Score: 12    End of Session Equipment Utilized During Treatment: Gait belt Activity Tolerance: Patient tolerated treatment well Patient left: in chair;with call bell/phone within reach;with chair alarm set;with family/visitor present (daughter in room; heels floated in recliner) Nurse Communication: Mobility status;Other (comment) (+2 for gait safety, moderate DOE may benefit from O2 with exertion at times) PT Visit Diagnosis: Muscle weakness (generalized) (M62.81);Other abnormalities of gait and mobility (R26.89);History  of falling (Z91.81);Repeated falls (R29.6);Unsteadiness on feet (R26.81);Dizziness and giddiness (R42)     Time: [id number] PT Time Calculation (min) (ACUTE ONLY): 23 min  Charges:  $Gait Training: 8-22 mins $Therapeutic Activity: 8-22 mins                     Dierdra Salameh P., PTA Acute Rehabilitation Services Secure Chat Preferred 9a-5:30pm Office: [place] 01/13/2023, 3:17 PM

## 2023-01-14 ENCOUNTER — Other Ambulatory Visit: Payer: Self-pay

## 2023-01-14 ENCOUNTER — Inpatient Hospital Stay (HOSPITAL_COMMUNITY)
Admission: RE | Admit: 2023-01-14 | Discharge: 2023-01-21 | DRG: 945 | Disposition: A | Discharge status: Home Health | Payer: Medicare Other | Source: Intra-hospital | Attending: Physical Medicine & Rehabilitation | Admitting: Physical Medicine & Rehabilitation

## 2023-01-14 ENCOUNTER — Encounter (HOSPITAL_COMMUNITY): Payer: Self-pay | Admitting: Physical Medicine & Rehabilitation

## 2023-01-14 DIAGNOSIS — M479 Spondylosis, unspecified: Secondary | ICD-10-CM | POA: Diagnosis present

## 2023-01-14 DIAGNOSIS — J189 Pneumonia, unspecified organism: Secondary | ICD-10-CM | POA: Diagnosis present

## 2023-01-14 DIAGNOSIS — J9600 Acute respiratory failure, unspecified whether with hypoxia or hypercapnia: Principal | ICD-10-CM | POA: Diagnosis present

## 2023-01-14 DIAGNOSIS — Z882 Allergy status to sulfonamides status: Secondary | ICD-10-CM

## 2023-01-14 DIAGNOSIS — M170 Bilateral primary osteoarthritis of knee: Secondary | ICD-10-CM | POA: Diagnosis present

## 2023-01-14 DIAGNOSIS — Z79899 Other long term (current) drug therapy: Secondary | ICD-10-CM | POA: Diagnosis not present

## 2023-01-14 DIAGNOSIS — M19042 Primary osteoarthritis, left hand: Secondary | ICD-10-CM | POA: Diagnosis present

## 2023-01-14 DIAGNOSIS — R5381 Other malaise: Principal | ICD-10-CM | POA: Diagnosis present

## 2023-01-14 DIAGNOSIS — E119 Type 2 diabetes mellitus without complications: Secondary | ICD-10-CM | POA: Diagnosis present

## 2023-01-14 DIAGNOSIS — R1310 Dysphagia, unspecified: Secondary | ICD-10-CM | POA: Diagnosis not present

## 2023-01-14 DIAGNOSIS — D539 Nutritional anemia, unspecified: Secondary | ICD-10-CM | POA: Diagnosis not present

## 2023-01-14 DIAGNOSIS — E669 Obesity, unspecified: Secondary | ICD-10-CM | POA: Diagnosis present

## 2023-01-14 DIAGNOSIS — E781 Pure hyperglyceridemia: Secondary | ICD-10-CM | POA: Diagnosis present

## 2023-01-14 DIAGNOSIS — R351 Nocturia: Secondary | ICD-10-CM | POA: Diagnosis present

## 2023-01-14 DIAGNOSIS — G8929 Other chronic pain: Secondary | ICD-10-CM | POA: Diagnosis present

## 2023-01-14 DIAGNOSIS — G4733 Obstructive sleep apnea (adult) (pediatric): Secondary | ICD-10-CM | POA: Diagnosis present

## 2023-01-14 DIAGNOSIS — Z7901 Long term (current) use of anticoagulants: Secondary | ICD-10-CM | POA: Diagnosis not present

## 2023-01-14 DIAGNOSIS — Z7984 Long term (current) use of oral hypoglycemic drugs: Secondary | ICD-10-CM | POA: Diagnosis not present

## 2023-01-14 DIAGNOSIS — Z7952 Long term (current) use of systemic steroids: Secondary | ICD-10-CM

## 2023-01-14 DIAGNOSIS — Z6830 Body mass index (BMI) 30.0-30.9, adult: Secondary | ICD-10-CM | POA: Diagnosis not present

## 2023-01-14 DIAGNOSIS — J302 Other seasonal allergic rhinitis: Secondary | ICD-10-CM | POA: Diagnosis not present

## 2023-01-14 DIAGNOSIS — I951 Orthostatic hypotension: Secondary | ICD-10-CM | POA: Diagnosis not present

## 2023-01-14 DIAGNOSIS — M19041 Primary osteoarthritis, right hand: Secondary | ICD-10-CM | POA: Diagnosis present

## 2023-01-14 DIAGNOSIS — I4891 Unspecified atrial fibrillation: Secondary | ICD-10-CM | POA: Diagnosis present

## 2023-01-14 DIAGNOSIS — R4181 Age-related cognitive decline: Secondary | ICD-10-CM | POA: Diagnosis not present

## 2023-01-14 DIAGNOSIS — J9601 Acute respiratory failure with hypoxia: Secondary | ICD-10-CM | POA: Diagnosis present

## 2023-01-14 DIAGNOSIS — D649 Anemia, unspecified: Secondary | ICD-10-CM | POA: Diagnosis not present

## 2023-01-14 LAB — GLUCOSE, CAPILLARY
Glucose-Capillary: 234 mg/dL — ABNORMAL HIGH (ref 70–99)
Glucose-Capillary: 121 mg/dL — ABNORMAL HIGH (ref 70–99)
Glucose-Capillary: 156 mg/dL — ABNORMAL HIGH (ref 70–99)

## 2023-01-14 MED ORDER — LIDOCAINE 5 % EX PTCH: 2.0000 | MEDICATED_PATCH | Freq: Every day | CUTANEOUS | Status: DC | PRN

## 2023-01-14 MED ORDER — BENZONATATE 100 MG PO CAPS: 100.0000 mg | ORAL_CAPSULE | Freq: Two times a day (BID) | ORAL | 0 refills | Status: DC | PRN

## 2023-01-14 MED ORDER — POLYETHYLENE GLYCOL 3350 17 G PO PACK: 17.0000 g | PACK | Freq: Every day | ORAL | Status: DC | PRN

## 2023-01-14 MED ORDER — PREDNISONE 20 MG PO TABS: 20.0000 mg | ORAL_TABLET | Freq: Every day | ORAL | Status: DC

## 2023-01-14 MED ORDER — PREDNISONE 20 MG PO TABS: 40.0000 mg | ORAL_TABLET | Freq: Every day | ORAL | Status: DC

## 2023-01-14 MED ORDER — TRAMADOL HCL 50 MG PO TABS: 50.0000 mg | ORAL_TABLET | Freq: Three times a day (TID) | ORAL | Status: DC | PRN

## 2023-01-14 MED ORDER — METOPROLOL TARTRATE 25 MG PO TABS: 25.0000 mg | ORAL_TABLET | Freq: Three times a day (TID) | ORAL | 2 refills | Status: DC

## 2023-01-14 MED ORDER — METOPROLOL TARTRATE 25 MG PO TABS
25.0000 mg | ORAL_TABLET | Freq: Three times a day (TID) | ORAL | Status: DC
  Administered 2023-01-14 – 2023-01-17 (×9): 25 mg via ORAL
  Filled 2023-01-14 (×9): qty 1

## 2023-01-14 MED ORDER — APIXABAN 2.5 MG PO TABS
2.5000 mg | ORAL_TABLET | Freq: Two times a day (BID) | ORAL | Status: DC
  Administered 2023-01-14 – 2023-01-21 (×14): 2.5 mg via ORAL
  Filled 2023-01-14 (×14): qty 1

## 2023-01-14 MED ORDER — DOCUSATE SODIUM 100 MG PO CAPS: 100.0000 mg | ORAL_CAPSULE | Freq: Every day | ORAL | 0 refills | Status: DC

## 2023-01-14 MED ORDER — PANTOPRAZOLE SODIUM 40 MG PO TBEC: 40.0000 mg | DELAYED_RELEASE_TABLET | Freq: Every day | ORAL | Status: DC | PRN

## 2023-01-14 MED ORDER — DOCUSATE SODIUM 100 MG PO CAPS
100.0000 mg | ORAL_CAPSULE | Freq: Every day | ORAL | Status: DC
  Administered 2023-01-15 – 2023-01-21 (×7): 100 mg via ORAL
  Filled 2023-01-14 (×7): qty 1

## 2023-01-14 MED ORDER — BENZONATATE 100 MG PO CAPS
100.0000 mg | ORAL_CAPSULE | Freq: Two times a day (BID) | ORAL | Status: DC | PRN
  Administered 2023-01-14 – 2023-01-15 (×2): 100 mg via ORAL
  Filled 2023-01-14 (×2): qty 1

## 2023-01-14 MED ORDER — DULOXETINE HCL 30 MG PO CPEP
30.0000 mg | ORAL_CAPSULE | Freq: Every day | ORAL | Status: DC
  Administered 2023-01-14 – 2023-01-20 (×7): 30 mg via ORAL
  Filled 2023-01-14 (×7): qty 1

## 2023-01-14 MED ORDER — PREDNISONE 10 MG PO TABS: ORAL_TABLET | ORAL | 0 refills | Status: DC

## 2023-01-14 MED ORDER — APIXABAN 2.5 MG PO TABS: 2.5000 mg | ORAL_TABLET | Freq: Two times a day (BID) | ORAL | 2 refills | Status: DC

## 2023-01-14 MED ORDER — IPRATROPIUM-ALBUTEROL 0.5-2.5 (3) MG/3ML IN SOLN: 3.0000 mL | RESPIRATORY_TRACT | 0 refills | Status: DC | PRN

## 2023-01-14 MED ORDER — PREDNISONE 5 MG PO TABS: 30.0000 mg | ORAL_TABLET | Freq: Every day | ORAL | Status: DC

## 2023-01-14 MED ORDER — MOMETASONE FUROATE 50 MCG/ACT NA SUSP: 2.0000 | Freq: Every day | NASAL | Status: AC | PRN

## 2023-01-14 MED ORDER — PREDNISONE 10 MG PO TABS: 30.0000 mg | ORAL_TABLET | Freq: Every day | ORAL | Status: DC

## 2023-01-14 MED ORDER — GUAIFENESIN ER 600 MG PO TB12
600.0000 mg | ORAL_TABLET | Freq: Two times a day (BID) | ORAL | Status: DC
  Administered 2023-01-14 – 2023-01-21 (×14): 600 mg via ORAL
  Filled 2023-01-14 (×14): qty 1

## 2023-01-14 MED ORDER — HYDROCOD POLI-CHLORPHE POLI ER 10-8 MG/5ML PO SUER
5.0000 mL | Freq: Every evening | ORAL | Status: DC | PRN
  Filled 2023-01-14: qty 5

## 2023-01-14 MED ORDER — HYDROCOD POLI-CHLORPHE POLI ER 10-8 MG/5ML PO SUER: 5.0000 mL | Freq: Every evening | ORAL | 0 refills | Status: DC | PRN

## 2023-01-14 MED ORDER — PREDNISONE 5 MG PO TABS: 10.0000 mg | ORAL_TABLET | Freq: Every day | ORAL | Status: DC

## 2023-01-14 MED ORDER — ORAL CARE MOUTH RINSE: 15.0000 mL | OROMUCOSAL | Status: DC | PRN

## 2023-01-14 MED ORDER — MAGIC MOUTHWASH W/LIDOCAINE: 1.0000 mL | ORAL | Status: DC | PRN

## 2023-01-14 MED ORDER — ACETAMINOPHEN 325 MG PO TABS
650.0000 mg | ORAL_TABLET | Freq: Four times a day (QID) | ORAL | Status: DC | PRN
  Administered 2023-01-19 (×2): 650 mg via ORAL
  Filled 2023-01-14 (×2): qty 2

## 2023-01-14 MED ORDER — NYSTATIN 100000 UNIT/ML MT SUSP: 5.0000 mL | Freq: Four times a day (QID) | OROMUCOSAL | 0 refills | Status: DC

## 2023-01-14 MED ORDER — PANTOPRAZOLE SODIUM 40 MG PO TBEC
40.0000 mg | DELAYED_RELEASE_TABLET | Freq: Every day | ORAL | Status: DC
  Administered 2023-01-15 – 2023-01-21 (×7): 40 mg via ORAL
  Filled 2023-01-14 (×7): qty 1

## 2023-01-14 MED ORDER — PREDNISONE 10 MG PO TABS: 10.0000 mg | ORAL_TABLET | Freq: Every day | ORAL | Status: DC

## 2023-01-14 MED ORDER — PREDNISONE 20 MG PO TABS
40.0000 mg | ORAL_TABLET | Freq: Every day | ORAL | Status: DC
  Administered 2023-01-15 – 2023-01-21 (×7): 40 mg via ORAL
  Filled 2023-01-14 (×7): qty 2

## 2023-01-14 MED ORDER — NYSTATIN 100000 UNIT/ML MT SUSP
5.0000 mL | Freq: Four times a day (QID) | OROMUCOSAL | Status: DC
  Administered 2023-01-14 – 2023-01-21 (×26): 500000 [IU] via OROMUCOSAL
  Filled 2023-01-14 (×23): qty 5

## 2023-01-14 MED ORDER — MAGIC MOUTHWASH W/LIDOCAINE: 1.0000 mL | ORAL | 0 refills | Status: DC | PRN

## 2023-01-14 MED ORDER — METFORMIN HCL 500 MG PO TABS: 500.0000 mg | ORAL_TABLET | Freq: Two times a day (BID) | ORAL | Status: DC

## 2023-01-14 MED ORDER — IPRATROPIUM-ALBUTEROL 0.5-2.5 (3) MG/3ML IN SOLN: 3.0000 mL | RESPIRATORY_TRACT | Status: DC | PRN

## 2023-01-14 MED ORDER — ACETAMINOPHEN 325 MG PO TABS: 1000.0000 mg | ORAL_TABLET | Freq: Four times a day (QID) | ORAL | Status: DC | PRN

## 2023-01-14 MED ORDER — PRAVASTATIN SODIUM 10 MG PO TABS
20.0000 mg | ORAL_TABLET | Freq: Every day | ORAL | Status: DC
  Administered 2023-01-14 – 2023-01-20 (×7): 20 mg via ORAL
  Filled 2023-01-14 (×7): qty 2

## 2023-01-14 MED ORDER — INSULIN ASPART 100 UNIT/ML IJ SOLN
0.0000 [IU] | Freq: Three times a day (TID) | INTRAMUSCULAR | Status: DC
  Administered 2023-01-14: 2 [IU] via SUBCUTANEOUS
  Administered 2023-01-15: 11 [IU] via SUBCUTANEOUS
  Administered 2023-01-15: 2 [IU] via SUBCUTANEOUS
  Administered 2023-01-16: 3 [IU] via SUBCUTANEOUS
  Administered 2023-01-16 – 2023-01-17 (×2): 2 [IU] via SUBCUTANEOUS
  Administered 2023-01-18: 5 [IU] via SUBCUTANEOUS
  Administered 2023-01-18: 2 [IU] via SUBCUTANEOUS
  Administered 2023-01-19 – 2023-01-20 (×2): 5 [IU] via SUBCUTANEOUS
  Administered 2023-01-21: 2 [IU] via SUBCUTANEOUS

## 2023-01-14 MED ORDER — GUAIFENESIN ER 600 MG PO TB12: 600.0000 mg | ORAL_TABLET | Freq: Two times a day (BID) | ORAL | 0 refills | Status: DC | PRN

## 2023-01-14 NOTE — Progress Notes (Signed)
Inpatient Rehabilitation Admission Medication Review by a Pharmacist  A complete drug regimen review was completed for this patient to identify any potential clinically significant medication issues.  High Risk Drug Classes Is patient taking? Indication by Medication  Antipsychotic No   Anticoagulant Yes Apixaban - Afib  Antibiotic No   Opioid No   Antiplatelet No   Hypoglycemics/insulin Yes SSI - DM  Vasoactive Medication Yes Metoprolol - Afib, BP  Chemotherapy No   Other Yes Tussionex, benzonatate - prn cough  Duloxetine - mood Duo-neb prn SOB Lidocaine patch - pain Nystatin - thrush Pantoprazole - reflux Pravastatin - HLD Prednisone taper - lung disease     Type of Medication Issue Identified Description of Issue Recommendation(s)  Drug Interaction(s) (clinically significant)     Duplicate Therapy     Allergy     No Medication Administration End Date     Incorrect Dose     Additional Drug Therapy Needed     Significant med changes from prior encounter (inform family/care partners about these prior to discharge).    Other       Clinically significant medication issues were identified that warrant physician communication and completion of prescribed/recommended actions by midnight of the next day:  No  Pharmacist comments: None  Time spent performing this drug regimen review (minutes): 20 minutes  Thank you Anette Guarneri, PharmD

## 2023-01-14 NOTE — TOC Transition Note (Signed)
Patient is scheduled for discharge to CIR today. Transported via bed, in-hospital transfer. No further TOC needs noted.

## 2023-01-14 NOTE — H&P (Signed)
Physical Medicine and Rehabilitation Admission H&P  CC: Pulmonary debility  HPI: [name] is an 81-year right-handed [place] speaking female with history of diabetes mellitus, atrial fibrillation currently maintained on Eliquis, chronic back pain, hyperlipidemia subdural hematoma after fall 2015 while on Coumadin.  Per chart review patient lives with her children.  Two-level home bed and bath upstairs.  Ambulates with a rollator in the home.  Independent with ADLs.  Presented 01/04/2023 with increasing difficulties breathing and a cough as well as fever of 103.  Chest x-ray with no acute process.  CT angiogram of the chest no pulmonary emboli identified.  Admission chemistries unremarkable except glucose 152, coronavirus negative, WBC 14,100, hemoglobin 9.8, blood cultures no growth to date, lactic acid 2.6.  Placed on broad-spectrum antibiotics initiated and completed as well as prednisone taper that she will remain on through 01/22/2023.  Echocardiogram with ejection fraction of 60 to 65% no wall motion abnormalities.  Still using nasal cannula oxygen to maintain saturations.  Tolerating a mechanical soft diet.  Therapy evaluations completed due to patient's debility decreased functional mobility was admitted for a comprehensive rehab program. Daughter says she uses Purewick at home at night.  Review of Systems  Constitutional:  Positive for chills, fever and malaise/fatigue. Negative for weight loss.  HENT:  Negative for hearing loss.   Eyes:  Negative for blurred vision and double vision.  Respiratory:  Positive for cough and shortness of breath.   Cardiovascular:  Positive for palpitations. Negative for chest pain and leg swelling.  Gastrointestinal:  Positive for constipation. Negative for heartburn, nausea and vomiting.       GERD  Genitourinary:  Negative for dysuria, flank pain and hematuria.  Musculoskeletal:  Positive for back pain, joint pain and myalgias.  Skin:  Negative for rash.   All other systems reviewed and are negative.  Past Medical History:  Diagnosis Date   Atrial fibrillation ([place])    Chronic back pain    "mid back down into lower back" (08/25/2014)   Diabetes mellitus without complication ([facility])    Frequent falls    GERD (gastroesophageal reflux disease)    Hypertriglyceridemia    OSA on CPAP    Osteoarthritis    "knees, hands, back" (08/25/2014)   Pneumonia ~ 2000 X 1   Subdural hematoma ([place]) july 2015   S/P fall while on Coumadin   T12 compression fracture ([place]) 2012   Type II diabetes mellitus ([facility])    Past Surgical History:  Procedure Laterality Date   APPENDECTOMY  2012   CATARACT EXTRACTION W/ INTRAOCULAR LENS  IMPLANT, BILATERAL Bilateral 2000's   IR GENERIC HISTORICAL  10/15/2016   IR PERCUTANEOUS ART THROMBECTOMY/INFUSION INTRACRANIAL INC DIAG ANGIO 10/15/2016 [name], MD [facility]   IR GENERIC HISTORICAL  12/13/2016   IR RADIOLOGIST EVAL & MGMT 12/13/2016 [facility]   RADIOLOGY WITH ANESTHESIA N/A 10/15/2016   Procedure: RADIOLOGY WITH ANESTHESIA;  Surgeon: [name], MD;  Location: [facility];  Service: Radiology;  Laterality: N/A;   TOTAL ABDOMINAL HYSTERECTOMY  1990   Family History  Problem Relation Age of Onset   Diabetes Mellitus II Neg Hx    Hypertension Neg Hx    Social History:  reports that she has never smoked. She has been exposed to tobacco smoke. She has never used smokeless tobacco. She reports that she does not drink alcohol and does not use drugs. Allergies:  Allergies  Allergen Reactions   Bactrim [Sulfamethoxazole-Trimethoprim] Cough    And flushing  of face and conjunctivitis.    Medications Prior to Admission  Medication Sig Dispense Refill   acetaminophen (TYLENOL) 325 MG tablet Take 3 tablets (975 mg total) by mouth every 6 (six) hours as needed for mild pain, headache or fever.     apixaban (ELIQUIS) 5 MG TABS tablet Take 1 tablet (5 mg total) by mouth 2 (two) times daily. 60 tablet 0    benzonatate (TESSALON) 100 MG capsule Take 1 capsule (100 mg total) by mouth 2 (two) times daily as needed for cough. 20 capsule 0   Calcium Carbonate (CALCIUM-CARB 600 PO) Take 1 tablet by mouth daily. Gummy     cetirizine (ZYRTEC) 10 MG tablet Take 10 mg daily as needed by mouth (seasonal allergies).     chlorpheniramine-HYDROcodone (TUSSIONEX) 10-8 MG/5ML Take 5 mLs by mouth at bedtime as needed for cough. 70 mL 0   Cholecalciferol (VITAMIN D) 50 MCG (2000 UT) tablet Take 1 tablet (2,000 Units total) by mouth daily. 30 tablet 0   docusate sodium (COLACE) 100 MG capsule Take 1 capsule (100 mg total) by mouth daily. 10 capsule 0   DULoxetine (CYMBALTA) 30 MG capsule Take 30 mg by mouth at bedtime.     FORTEO 600 MCG/2.4ML SOPN Inject 20 mcg into the skin at bedtime.     guaiFENesin (MUCINEX) 600 MG 12 hr tablet Take 1 tablet (600 mg total) by mouth 2 (two) times daily as needed for cough or to loosen phlegm. 60 tablet 0   ipratropium-albuterol (DUONEB) 0.5-2.5 (3) MG/3ML SOLN Take 3 mLs by nebulization every 4 (four) hours as needed. 360 mL 0   lidocaine (LIDODERM) 5 % Place 2 patches onto the skin daily as needed (back pain). Remove & Discard patch within 12 hours or as directed by MD     magic mouthwash w/lidocaine SOLN Take 1 mL by mouth every 4 (four) hours as needed for mouth pain.  0   metFORMIN (GLUCOPHAGE) 500 MG tablet Take 1 tablet (500 mg total) by mouth 2 (two) times daily with a meal.     metoprolol tartrate (LOPRESSOR) 25 MG tablet Take 1 tablet (25 mg total) by mouth 3 (three) times daily. 90 tablet 2   mometasone (NASONEX) 50 MCG/ACT nasal spray Place 2 sprays into the nose daily as needed (Rhinitis).     nitroGLYCERIN (NITROSTAT) 0.4 MG SL tablet Place 1 tablet (0.4 mg total) under the tongue every 5 (five) minutes as needed for chest pain. 10 tablet 12   nystatin (MYCOSTATIN) 100000 UNIT/ML suspension Use as directed 5 mLs (500,000 Units total) in the mouth or throat 4 (four)  times daily for 6 days. 60 mL 0   pantoprazole (PROTONIX) 40 MG tablet Take 1 tablet (40 mg total) by mouth daily as needed (Indigestion).     pravastatin (PRAVACHOL) 20 MG tablet Take 1 tablet (20 mg total) by mouth at bedtime. 30 tablet 0   predniSONE (DELTASONE) 10 MG tablet Take 4 tablets (40 mg total) by mouth daily for 10 days, THEN 3 tablets (30 mg total) daily for 7 days, THEN 2 tablets (20 mg total) daily for 7 days, THEN 1 tablet (10 mg total) daily for 7 days. 82 tablet 0   [START ON 01/23/2023] predniSONE (DELTASONE) 10 MG tablet Take 3 tablets (30 mg total) by mouth daily with breakfast.     [START ON 02/06/2023] predniSONE (DELTASONE) 10 MG tablet Take 1 tablet (10 mg total) by mouth daily with breakfast.     Derrill Memo  ON 01/15/2023] predniSONE (DELTASONE) 20 MG tablet Take 2 tablets (40 mg total) by mouth daily with breakfast for 7 days.     [START ON 01/30/2023] predniSONE (DELTASONE) 20 MG tablet Take 1 tablet (20 mg total) by mouth daily with breakfast.     senna-docusate (SENOKOT-S) 8.6-50 MG tablet Take 1 tablet by mouth at bedtime as needed for mild constipation.     traMADol (ULTRAM) 50 MG tablet Take 1 tablet (50 mg total) by mouth every 8 (eight) hours as needed for moderate pain.     vitamin k 100 MCG tablet Take 100 mcg by mouth daily.     Home: Home Living Family/patient expects to be discharged to:: Private residence Living Arrangements: Children Available Help at Discharge: Family Type of Home: House Home Access: Stairs to enter Technical brewer of Steps: 2 Entrance Stairs-Rails: Left Home Layout: Two level, Bed/bath upstairs Alternate Level Stairs-Number of Steps: 12 Alternate Level Stairs-Rails: Right Bathroom Shower/Tub: Chiropodist: Handicapped height Bathroom Accessibility: Yes Home Equipment: Rollator (4 wheels), Pala (2 wheels), Tub bench, BSC/3in1   Functional History: Prior Function Prior Level of Function : Needs  assist  Cognitive Assist : ADLs (cognitive) Physical Assist : ADLs (physical) ADLs (physical): Bathing, Dressing Mobility Comments: ambulates with Rollator in home and grocery store, hx of falling so family always walks with her ADLs Comments: reports independence with ADLs, cooks for her large family and does grocery shopping   Functional Status:  Mobility: Bed Mobility Overal bed mobility: Needs Assistance Bed Mobility: Supine to Sit Supine to sit: Mod assist Sit to supine: Max assist, +2 for physical assistance General bed mobility comments: Pt in chair on arrival and wanted to stay up Transfers Overall transfer level: Needs assistance Equipment used: Rolling walker (2 wheels) Transfers: Sit to/from Stand Sit to Stand: Min assist, +2 safety/equipment, Mod assist Bed to/from chair/wheelchair/BSC transfer type:: Step pivot Step pivot transfers: Mod assist General transfer comment: hand over hand assist for safe UE placement with each transfer with specific cues provided, pt responds better wtih Errorless Learning strategy due to hx mild cognitive impairment. Loss of balance at times requiring increased assist for lowering as she fatigued up to modA Ambulation/Gait Ambulation/Gait assistance: Min assist, Mod assist, +2 safety/equipment Gait Distance (Feet): 25 Feet (81ft, 11ft, 69ft with seated breaks between all trials to recover SpO2) Assistive device: Rolling walker (2 wheels) Gait Pattern/deviations: Step-through pattern, Decreased dorsiflexion - left, Decreased weight shift to right, Step-to pattern, Trunk flexed General Gait Details: Pt SpO2 with poor signal due to grip of fingers on RW, however on RA SpO2 remains low with standing break as pt resting her hands, SpO2 82% on RA so pt increased to 2L and SpO2 improved to >92% within 30 seconds at rest. Pt SpO2 reading 87-94% on 2L for remainder of functional mobility with at times poor signal, tending to read better on her thumbs than  on other fingers. Pt needing minA initially but with increased distance and LLE fatigue, pt needs intermittent modA for AD use and LLE stepping. Pt with decreased L dorsiflexion at baseline and may benefit from LLE AFO pending progress as L foot catching floor is greatly increasing her fall risk, MD notified. Chair follow for safety. Gait velocity: slowed Gait velocity interpretation: <1.31 ft/sec, indicative of household ambulator Pre-gait activities: shuffled steps toward her L side and anterior for pivotal transfer to recliner from EOB using RW, too fatigued for gait trial Stairs:  (pt not able this date, too  deconditioned/fatigued)   ADL: ADL Overall ADL's : Needs assistance/impaired Eating/Feeding: Set up, Sitting Grooming: Minimal assistance, Sitting Grooming Details (indicate cue type and reason): Pt with posterior lean when standing. Had focused on toileting;therefore did not stand to groom. Upper Body Bathing: Minimal assistance, Sitting Lower Body Bathing: Moderate assistance, Sitting/lateral leans Upper Body Dressing : Set up, Sitting Lower Body Dressing: Moderate assistance, Sitting/lateral leans Lower Body Dressing Details (indicate cue type and reason): assist to start pullup over legs Toilet Transfer: Minimal assistance, Stand-pivot, BSC/3in1 Toilet Transfer Details (indicate cue type and reason): Pt with posterior lean Toileting- Clothing Manipulation and Hygiene: Maximal assistance Toileting - Clothing Manipulation Details (indicate cue type and reason): Pt attempted to clean self while therapist provided mod assist to stand due to posterior lean Functional mobility during ADLs: Moderate assistance, Rolling walker (2 wheels) General ADL Comments: Pt more awake and participatory today.  Pt continues to have weakness and is very unsteady on feet.  Pt will need someone with her all the time at d/c   Cognition: Cognition Overall Cognitive Status: History of cognitive  impairments - at baseline Orientation Level: Oriented X4 Cognition Arousal/Alertness: Awake/alert Behavior During Therapy: Flat affect Overall Cognitive Status: History of cognitive impairments - at baseline General Comments: Daughter [name] there to translate and feels pt is at baseline. Pt has cognitive deficits at baseline but very motivated and reponds better to Errorless learning strategy for safety cues with transfers/gait. Difficult to assess due to: Non-English speaking      Physical Exam: Blood pressure (!) 144/82, pulse 79, temperature (!) 97.3 F (36.3 C), resp. rate 15, height 4\' 8"  (1.422 m), weight 61 kg, SpO2 97 %. Gen: no distress, normal appearing HEENT: oral mucosa pink and moist, NCAT Cardio: Irregularly irregular rhythm Chest: normal effort, normal rate of breathing Abd: soft, non-distended Ext: no edema Psych: pleasant, normal affect Skin: intact Physical Exam Neurological:     Comments: Patient is alert/frail.  Makes eye contact with examiner.  Non-English speaking.  Follows simple demonstrated commands. No focal deficits  Results for orders placed or performed during the hospital encounter of 01/14/23 (from the past 48 hour(s))  Glucose, capillary     Status: Abnormal   Collection Time: 01/14/23  4:42 PM  Result Value Ref Range   Glucose-Capillary 121 (H) 70 - 99 mg/dL    Comment: Glucose reference range applies only to samples taken after fasting for at least 8 hours.   No results found.    Blood pressure (!) 144/82, pulse 79, temperature (!) 97.3 F (36.3 C), resp. rate 15, height 4\' 8"  (1.422 m), weight 61 kg, SpO2 97 %.  Medical Problem List and Plan: 1. Functional deficits secondary to acute respiratory failure exacerbation of underlying subacute or chronic lung disease.  Prednisone 40 mg daily through 01/22/2023 tapering by 10 mg weekly  -patient may shower  -ELOS/Goals: 10-14 days   Admit to CIR 2.  Antithrombotics: -DVT/anticoagulation:   Pharmaceutical: Eliquis  -antiplatelet therapy: N/A 3. Pain Management: Lidoderm patch, Tylenol as needed 4. Mood/Behavior/Sleep: Cymbalta 30 mg nightly  -antipsychotic agents: N/A 5. Neuropsych/cognition: This patient is capable of making decisions on her own behalf. 6. Skin/Wound Care: Routine skin checks 7. Fluids/Electrolytes/Nutrition: Routine in and outs with follow-up chemistries 8.  Atrial fibrillation.  Continue Eliquis.  Lopressor 25 mg 3 times daily, cardiac rate controlled 9.  Hyperlipidemia.  Pravachol 10.  Diabetes mellitus.  SSI.  Monitor while on prednisone. Check magnesium level tomorrow 11. Screening for vitamin D deficiency:  check vitamin D level tomorrow 12. Nocturia: placed ordered that she may use purewick at night 13. Anemia: check iron and B12 tomorrow   I have personally performed a face to face diagnostic evaluation, including, but not limited to relevant history and physical exam findings, of this patient and developed relevant assessment and plan.  Additionally, I have reviewed and concur with the physician assistant's documentation above.  Lavon Paganini Angiulli, PA-C   Izora Ribas, MD 01/14/2023

## 2023-01-15 DIAGNOSIS — R5381 Other malaise: Secondary | ICD-10-CM | POA: Diagnosis not present

## 2023-01-15 LAB — COMPREHENSIVE METABOLIC PANEL
ALT: 22 U/L (ref 0–44)
AST: 19 U/L (ref 15–41)
Albumin: 3.4 g/dL — ABNORMAL LOW (ref 3.5–5.0)
Alkaline Phosphatase: 48 U/L (ref 38–126)
Anion gap: 11 (ref 5–15)
BUN: 11 mg/dL (ref 8–23)
CO2: 30 mmol/L (ref 22–32)
Calcium: 9 mg/dL (ref 8.9–10.3)
Chloride: 99 mmol/L (ref 98–111)
Creatinine, Ser: 0.78 mg/dL (ref 0.44–1.00)
GFR, Estimated: >60 mL/min (ref >60)
Glucose, Bld: 104 mg/dL — ABNORMAL HIGH (ref 70–99)
Potassium: 3.8 mmol/L (ref 3.5–5.1)
Sodium: 140 mmol/L (ref 135–145)
Total Bilirubin: 0.8 mg/dL (ref 0.3–1.2)
Total Protein: 6.2 g/dL — ABNORMAL LOW (ref 6.5–8.1)

## 2023-01-15 LAB — CBC WITH DIFFERENTIAL/PLATELET
Abs Immature Granulocytes: 0.11 10*3/uL — ABNORMAL HIGH (ref 0.00–0.07)
Basophils Absolute: 0 10*3/uL (ref 0.0–0.1)
Basophils Relative: 0 %
Eosinophils Absolute: 0 10*3/uL (ref 0.0–0.5)
Eosinophils Relative: 0 %
HCT: 30.6 % — ABNORMAL LOW (ref 36.0–46.0)
Hemoglobin: 10.1 g/dL — ABNORMAL LOW (ref 12.0–15.0)
Immature Granulocytes: 1 %
Lymphocytes Relative: 14 %
Lymphs Abs: 1.3 10*3/uL (ref 0.7–4.0)
MCH: 34.4 pg — ABNORMAL HIGH (ref 26.0–34.0)
MCHC: 33 g/dL (ref 30.0–36.0)
MCV: 104.1 fL — ABNORMAL HIGH (ref 80.0–100.0)
Monocytes Absolute: 0.9 10*3/uL (ref 0.1–1.0)
Monocytes Relative: 9 %
Neutro Abs: 7.1 10*3/uL (ref 1.7–7.7)
Neutrophils Relative %: 76 %
Platelets: 468 10*3/uL — ABNORMAL HIGH (ref 150–400)
RBC: 2.94 MIL/uL — ABNORMAL LOW (ref 3.87–5.11)
RDW: 14.6 % (ref 11.5–15.5)
WBC: 9.5 10*3/uL (ref 4.0–10.5)
nRBC: 0 % (ref 0.0–0.2)

## 2023-01-15 LAB — IRON AND TIBC
Iron: 62 ug/dL (ref 28–170)
Saturation Ratios: 24 % (ref 10.4–31.8)
TIBC: 263 ug/dL (ref 250–450)
UIBC: 201 ug/dL

## 2023-01-15 LAB — GLUCOSE, CAPILLARY
Glucose-Capillary: 81 mg/dL (ref 70–99)
Glucose-Capillary: 133 mg/dL — ABNORMAL HIGH (ref 70–99)
Glucose-Capillary: 338 mg/dL — ABNORMAL HIGH (ref 70–99)
Glucose-Capillary: 172 mg/dL — ABNORMAL HIGH (ref 70–99)

## 2023-01-15 LAB — VITAMIN D 25 HYDROXY (VIT D DEFICIENCY, FRACTURES): Vit D, 25-Hydroxy: 55.81 ng/mL (ref 30–100)

## 2023-01-15 LAB — MAGNESIUM: Magnesium: 2.2 mg/dL (ref 1.7–2.4)

## 2023-01-15 LAB — VITAMIN B12: Vitamin B-12: 833 pg/mL (ref 180–914)

## 2023-01-15 MED ORDER — GUAIFENESIN 100 MG/5ML PO LIQD
5.0000 mL | ORAL | Status: DC | PRN
  Administered 2023-01-15 – 2023-01-17 (×3): 5 mL via ORAL
  Filled 2023-01-15 (×3): qty 5

## 2023-01-15 MED ORDER — POTASSIUM CHLORIDE 20 MEQ PO PACK
20.0000 meq | PACK | Freq: Once | ORAL | Status: AC
  Administered 2023-01-15: 20 meq via ORAL
  Filled 2023-01-15: qty 1

## 2023-01-15 NOTE — Progress Notes (Signed)
PROGRESS NOTE   Subjective/Complaints: No new complaints this morning Coughing - prn robitussin ordered and discussed with nursing Did well with purewick last night  ROS: +cough  Objective:   No results found. Recent Labs    01/12/23 2337 01/15/23 0607  WBC 10.5 9.5  HGB 8.6* 10.1*  HCT 27.7* 30.6*  PLT 357 468*   Recent Labs    01/12/23 2337 01/15/23 0607  NA 139 140  K 4.0 3.8  CL 98 99  CO2 31 30  GLUCOSE 131* 104*  BUN 13 11  CREATININE 0.78 0.78  CALCIUM 8.4* 9.0    Intake/Output Summary (Last 24 hours) at 01/15/2023 0918 Last data filed at 01/15/2023 0415 Gross per 24 hour  Intake --  Output 1400 ml  Net -1400 ml        Physical Exam: Vital Signs Blood pressure (!) 148/86, pulse 88, temperature (!) 97.5 F (36.4 C), resp. rate 16, height 4\' 8"  (1.422 m), weight 61 kg, SpO2 98 %. Gen: no distress, normal appearing HEENT: oral mucosa pink and moist, NCAT Cardio: Reg rate Chest: normal effort, normal rate of breathing, +cough Abd: soft, non-distended Ext: no edema Psych: pleasant, normal affect Skin: intact Physical Exam Neurological:     Comments: Patient is alert/frail.  Makes eye contact with examiner.  Non-English speaking.  Follows simple demonstrated commands. No focal deficits   Assessment/Plan: 1. Functional deficits which require 3+ hours per day of interdisciplinary therapy in a comprehensive inpatient rehab setting. Physiatrist is providing close team supervision and 24 hour management of active medical problems listed below. Physiatrist and rehab team continue to assess barriers to discharge/monitor patient progress toward functional and medical goals  Care Tool:  Bathing              Bathing assist       Upper Body Dressing/Undressing Upper body dressing        Upper body assist      Lower Body Dressing/Undressing Lower body dressing            Lower  body assist       Toileting Toileting    Toileting assist       Transfers Chair/bed transfer  Transfers assist           Locomotion Ambulation   Ambulation assist              Walk 10 feet activity   Assist           Walk 50 feet activity   Assist           Walk 150 feet activity   Assist           Walk 10 feet on uneven surface  activity   Assist           Wheelchair     Assist               Wheelchair 50 feet with 2 turns activity    Assist            Wheelchair 150 feet activity     Assist          Blood pressure [name])  148/86, pulse 88, temperature (!) 97.5 F (36.4 C), resp. rate 16, height 4\' 8"  (1.422 m), weight 61 kg, SpO2 98 %.  Medical Problem List and Plan: 1. Functional deficits secondary to acute respiratory failure exacerbation of underlying subacute or chronic lung disease.  Prednisone 40 mg daily through 01/22/2023 tapering by 10 mg weekly             -patient may shower             -ELOS/Goals: 10-14 days              Admit to CIR 2.  Antithrombotics: -DVT/anticoagulation:  Pharmaceutical: Eliquis             -antiplatelet therapy: N/A 3. Pain Management: Lidoderm patch, Tylenol as needed 4. Mood/Behavior/Sleep: Cymbalta 30 mg nightly             -antipsychotic agents: N/A 5. Neuropsych/cognition: This patient is capable of making decisions on her own behalf. 6. Skin/Wound Care: Routine skin checks 7. Fluids/Electrolytes/Nutrition: Routine in and outs with follow-up chemistries 8.  Atrial fibrillation.  Continue Eliquis.  Lopressor 25 mg 3 times daily, cardiac rate controlled 9.  Hyperlipidemia.  Pravachol 10.  Diabetes mellitus.  SSI.  Monitor while on prednisone. Magnesium reviewed and is normal.  11. Obesity: provide dietary education.  12. Nocturia: placed ordered that she may use purewick at night 13. Anemia: iron and B12 reviewed and are normal 14. Cough: prn robitussin  ordered 15. Suboptimal potassium: klor 51meq ordered x1 on 3/31    LOS: 1 days A FACE TO FACE EVALUATION WAS PERFORMED  [name] 01/15/2023, 9:18 AM

## 2023-01-16 DIAGNOSIS — D649 Anemia, unspecified: Secondary | ICD-10-CM | POA: Diagnosis not present

## 2023-01-16 DIAGNOSIS — J9600 Acute respiratory failure, unspecified whether with hypoxia or hypercapnia: Secondary | ICD-10-CM | POA: Diagnosis not present

## 2023-01-16 DIAGNOSIS — E876 Hypokalemia: Secondary | ICD-10-CM

## 2023-01-16 DIAGNOSIS — R1310 Dysphagia, unspecified: Secondary | ICD-10-CM

## 2023-01-16 DIAGNOSIS — E119 Type 2 diabetes mellitus without complications: Secondary | ICD-10-CM

## 2023-01-16 DIAGNOSIS — J302 Other seasonal allergic rhinitis: Secondary | ICD-10-CM | POA: Diagnosis not present

## 2023-01-16 LAB — CBC WITH DIFFERENTIAL/PLATELET
Abs Immature Granulocytes: 0.11 10*3/uL — ABNORMAL HIGH (ref 0.00–0.07)
Basophils Absolute: 0 10*3/uL (ref 0.0–0.1)
Basophils Relative: 0 %
Eosinophils Absolute: 0 10*3/uL (ref 0.0–0.5)
Eosinophils Relative: 0 %
HCT: 31.9 % — ABNORMAL LOW (ref 36.0–46.0)
Hemoglobin: 10.2 g/dL — ABNORMAL LOW (ref 12.0–15.0)
Immature Granulocytes: 1 %
Lymphocytes Relative: 15 %
Lymphs Abs: 1.5 10*3/uL (ref 0.7–4.0)
MCH: 34.2 pg — ABNORMAL HIGH (ref 26.0–34.0)
MCHC: 32 g/dL (ref 30.0–36.0)
MCV: 107 fL — ABNORMAL HIGH (ref 80.0–100.0)
Monocytes Absolute: 1.1 10*3/uL — ABNORMAL HIGH (ref 0.1–1.0)
Monocytes Relative: 11 %
Neutro Abs: 7.3 10*3/uL (ref 1.7–7.7)
Neutrophils Relative %: 73 %
Platelets: 479 10*3/uL — ABNORMAL HIGH (ref 150–400)
RBC: 2.98 MIL/uL — ABNORMAL LOW (ref 3.87–5.11)
RDW: 14.6 % (ref 11.5–15.5)
WBC: 10 10*3/uL (ref 4.0–10.5)
nRBC: 0 % (ref 0.0–0.2)

## 2023-01-16 LAB — COMPREHENSIVE METABOLIC PANEL
ALT: 20 U/L (ref 0–44)
AST: 17 U/L (ref 15–41)
Albumin: 3.2 g/dL — ABNORMAL LOW (ref 3.5–5.0)
Alkaline Phosphatase: 43 U/L (ref 38–126)
Anion gap: 12 (ref 5–15)
BUN: 13 mg/dL (ref 8–23)
CO2: 27 mmol/L (ref 22–32)
Calcium: 8.9 mg/dL (ref 8.9–10.3)
Chloride: 102 mmol/L (ref 98–111)
Creatinine, Ser: 0.88 mg/dL (ref 0.44–1.00)
GFR, Estimated: >60 mL/min (ref >60)
Glucose, Bld: 125 mg/dL — ABNORMAL HIGH (ref 70–99)
Potassium: 4.1 mmol/L (ref 3.5–5.1)
Sodium: 141 mmol/L (ref 135–145)
Total Bilirubin: 0.7 mg/dL (ref 0.3–1.2)
Total Protein: 5.6 g/dL — ABNORMAL LOW (ref 6.5–8.1)

## 2023-01-16 LAB — GLUCOSE, CAPILLARY
Glucose-Capillary: 90 mg/dL (ref 70–99)
Glucose-Capillary: 136 mg/dL — ABNORMAL HIGH (ref 70–99)
Glucose-Capillary: 176 mg/dL — ABNORMAL HIGH (ref 70–99)
Glucose-Capillary: 206 mg/dL — ABNORMAL HIGH (ref 70–99)

## 2023-01-16 MED ORDER — LORATADINE 10 MG PO TABS
10.0000 mg | ORAL_TABLET | Freq: Every day | ORAL | Status: DC
  Administered 2023-01-16 – 2023-01-21 (×6): 10 mg via ORAL
  Filled 2023-01-16 (×6): qty 1

## 2023-01-16 NOTE — Progress Notes (Signed)
Inpatient Rehabilitation Care Coordinator Assessment and Plan Patient Details  Name: [name] MRN: [id number] Date of Birth: [birth year]  Today's Date: 01/16/2023  Hospital Problems: Principal Problem:   Acute respiratory failure  Past Medical History:  Past Medical History:  Diagnosis Date   Atrial fibrillation ([place])    Chronic back pain    "mid back down into lower back" (08/25/2014)   Diabetes mellitus without complication (HCC)    Frequent falls    GERD (gastroesophageal reflux disease)    Hypertriglyceridemia    OSA on CPAP    Osteoarthritis    "knees, hands, back" (08/25/2014)   Pneumonia ~ 2000 X 1   Subdural hematoma ([place]) july 2015   S/P fall while on Coumadin   T12 compression fracture ([place]) 2012   Type II diabetes mellitus ([place])    Past Surgical History:  Past Surgical History:  Procedure Laterality Date   APPENDECTOMY  2012   CATARACT EXTRACTION W/ INTRAOCULAR LENS  IMPLANT, BILATERAL Bilateral 2000's   IR GENERIC HISTORICAL  10/15/2016   IR PERCUTANEOUS ART THROMBECTOMY/INFUSION INTRACRANIAL INC DIAG ANGIO 10/15/2016 [name], MD [facility]   IR GENERIC HISTORICAL  12/13/2016   IR RADIOLOGIST EVAL & MGMT 12/13/2016 [facility]   RADIOLOGY WITH ANESTHESIA N/A 10/15/2016   Procedure: RADIOLOGY WITH ANESTHESIA;  Surgeon: [name], MD;  Location: [facility];  Service: Radiology;  Laterality: N/A;   TOTAL ABDOMINAL HYSTERECTOMY  1990   Social History:  reports that she has never smoked. She has been exposed to tobacco smoke. She has never used smokeless tobacco. She reports that she does not drink alcohol and does not use drugs.  Family / Support Systems Marital Status: Widow/Widower Patient Roles: Parent, Other (Comment) (church member) Children: Delores-daughter [phone]  Erasmo Score [phone] Other Supports: other family members and church members Anticipated Caregiver: Taraji Ability/Limitations of Caregiver: supervision Caregiver Availability:  24/7 Family Dynamics: Close knit family who have been caring for pt since her SDH in 2015, she has been falling and now needs CGA level when up moving. Family is very involved and here while she is here  Social History Preferred language: [place] Religion: Buddhist Cultural Background: Facilities manager needs interpreter while here Education: some schooling Probation officer - How often do you need to have someone help you when you read instructions, pamphlets, or other written material from your doctor or pharmacy?: Always Writes: Yes (vietnamse) Employment Status: Retired Public relations account executive Issues: No issues Guardian/Conservator: None-according to MD pt is not fully capable of making her own decisions, will look toward her daughter who is her main caregiver to make any decisions while here   Abuse/Neglect Abuse/Neglect Assessment Can Be Completed: Yes Physical Abuse: Denies Verbal Abuse: Denies Sexual Abuse: Denies Exploitation of patient/patient's resources: Denies Self-Neglect: Denies  Patient response to: Social Isolation - How often do you feel lonely or isolated from those around you?: Never  Emotional Status Pt's affect, behavior and adjustment status: Pt is aware she is on rehab and has been here two other times before so aware of the rountine. She and daughter hope for her to get mobile and back to her CGA level someone walked beside her at home due to history of falls. Recent Psychosocial Issues: other health issues Psychiatric History: No history/issues seems to be coping appropriately and makes good eye contact with interviewer Substance Abuse History: No issues  Patient / Family Perceptions, Expectations & Goals Pt/Family understanding of illness & functional limitations: Pt and daughter can explain her health issues and  reasons for hospitalization pt seems to be doing better and recovering from her pneumonia. Will need to see if will need home O2 at discharge,  currently she is not on it. Daughter feels she needs this at home. Will make team aware and check O2 sats while in therapies Premorbid pt/family roles/activities: Mom, grandmother, church member, etc Anticipated changes in roles/activities/participation: resume Pt/family expectations/goals: Pt nods when worker is talking and daughter does interpret for her. Daughter states: "I hope she can be more mobile as she recovers. I'm glad she came back here."  [employer]: Other (Comment) Premorbid Home Care/DME Agencies: Other (Comment) (has had [facility] in the past has rollator, rw, tub bench and bsc) Transportation available at discharge: family provides this Is the patient able to respond to transportation needs?: Yes In the past 12 months, has lack of transportation kept you from medical appointments or from getting medications?: No In the past 12 months, has lack of transportation kept you from meetings, work, or from getting things needed for daily living?: No  Discharge Planning Living Arrangements: Children, Other relatives Support Systems: Children, Other relatives, Church/faith community Type of Residence: Private residence Insurance Resources: [employer], [employer] (specify county) Museum/gallery curator Resources: Family Support Financial Screen Referred: No Living Expenses: Lives with family Money Management: Family Does the patient have any problems obtaining your medications?: No Home Management: family Patient/Family Preliminary Plans: Return home with daughter and family members who were assisting her prior to admission. She is familar with CIR this is her  third time being here. Will await therapy evaluations and work on discharge needs. Care Coordinator Anticipated Follow Up Needs: HH/OP  Clinical Impression Pleasant female who is motivated to do well here and improve. Her daughter is present and is very involved and supportive. She interpretes for her. Will await  therapy evaluations and work on discharge needs. May need home O2 and team aware to monitor this while in therapies.  Elease Hashimoto 01/16/2023, 10:56 AM

## 2023-01-16 NOTE — Progress Notes (Signed)
PROGRESS NOTE   Subjective/Complaints: Reports continues to have coughing spells after drinking thin fluids.  She would like a medicine for seasonal allergies.  ROS: +cough.  Denies chest pain shortness of breath, abdominal pain, nausea, vomiting  Objective:   No results found. Recent Labs    01/15/23 0607 01/16/23 0514  WBC 9.5 10.0  HGB 10.1* 10.2*  HCT 30.6* 31.9*  PLT 468* 479*    Recent Labs    01/15/23 0607 01/16/23 0514  NA 140 141  K 3.8 4.1  CL 99 102  CO2 30 27  GLUCOSE 104* 125*  BUN 11 13  CREATININE 0.78 0.88  CALCIUM 9.0 8.9     Intake/Output Summary (Last 24 hours) at 01/16/2023 0819 Last data filed at 01/16/2023 [id number] Gross per 24 hour  Intake 480 ml  Output 2150 ml  Net -1670 ml         Physical Exam: Vital Signs Blood pressure 130/87, pulse 70, temperature 97.9 F (36.6 C), temperature source Oral, resp. rate 17, height 4\' 8"  (1.422 m), weight 61 kg, SpO2 97 %. Gen: no distress, normal appearing HEENT: oral mucosa pink and moist, NCAT Cardio: Reg rate Chest: CTAB, normal effort, normal rate of breathing, +cough Abd: soft, non-distended Ext: no edema Psych: pleasant, normal affect Skin: intact Physical Exam Neurological:     Comments: Patient is alert/frail.  Makes eye contact with examiner.  Non-English speaking.  Follows simple demonstrated commands. No focal deficits   Assessment/Plan: 1. Functional deficits which require 3+ hours per day of interdisciplinary therapy in a comprehensive inpatient rehab setting. Physiatrist is providing close team supervision and 24 hour management of active medical problems listed below. Physiatrist and rehab team continue to assess barriers to discharge/monitor patient progress toward functional and medical goals  Care Tool:  Bathing              Bathing assist       Upper Body Dressing/Undressing Upper body dressing        Upper  body assist      Lower Body Dressing/Undressing Lower body dressing            Lower body assist       Toileting Toileting    Toileting assist       Transfers Chair/bed transfer  Transfers assist           Locomotion Ambulation   Ambulation assist              Walk 10 feet activity   Assist           Walk 50 feet activity   Assist           Walk 150 feet activity   Assist           Walk 10 feet on uneven surface  activity   Assist           Wheelchair     Assist               Wheelchair 50 feet with 2 turns activity    Assist            Wheelchair 150 feet activity  Assist          Blood pressure 130/87, pulse 70, temperature 97.9 F (36.6 C), temperature source Oral, resp. rate 17, height 4\' 8"  (1.422 m), weight 61 kg, SpO2 97 %.  Medical Problem List and Plan: 1. Functional deficits secondary to acute respiratory failure exacerbation of underlying subacute or chronic lung disease.  Prednisone 40 mg daily through 01/22/2023 tapering by 10 mg weekly             -patient may shower             -ELOS/Goals: 10-14 days              Admit to CIR 2.  Antithrombotics: -DVT/anticoagulation:  Pharmaceutical: Eliquis             -antiplatelet therapy: N/A 3. Pain Management: Lidoderm patch, Tylenol as needed 4. Mood/Behavior/Sleep: Cymbalta 30 mg nightly             -antipsychotic agents: N/A 5. Neuropsych/cognition: This patient is capable of making decisions on her own behalf. 6. Skin/Wound Care: Routine skin checks 7. Fluids/Electrolytes/Nutrition: Routine in and outs with follow-up chemistries 8.  Atrial fibrillation.  Continue Eliquis.  Lopressor 25 mg 3 times daily, cardiac rate controlled 9.  Hyperlipidemia.  Pravachol 10.  Diabetes mellitus.  SSI.  Monitor while on prednisone. Magnesium reviewed and is normal.   -CBGs stable, continue to monitor CBG (last 3)  Recent Labs     01/15/23 2053 01/16/23 0605 01/16/23 1209  GLUCAP 172* 90 136*    11. Obesity: provide dietary education.  12. Nocturia: placed ordered that she may use purewick at night 13. Anemia: iron and B12 reviewed and are normal  -4/1 HGB up to 10.2 today 14. Cough: prn robitussin ordered 15. Suboptimal potassium: klor 73meq ordered x1 on 3/31  -4/1 K+ stable at 4.1 today 16.  Dysphagia.   -Other patient had MBS and esophagram during acute admission she reports continued issues with coughing after swallowing thin liquids.  Will place SLP consult for further evaluation 17. Seasonal allergies  -Start loratidine    LOS: 2 days A FACE TO FACE EVALUATION WAS PERFORMED  Jennye Boroughs 01/16/2023, 8:19 AM

## 2023-01-16 NOTE — Discharge Instructions (Addendum)
Inpatient Rehab Discharge Instructions  [facility] Discharge date and time: No discharge date for patient encounter.   Activities/Precautions/ Functional Status: Activity: activity as tolerated Diet: diabetic diet Wound Care: Routine skin checks Functional status:  ___ No restrictions     ___ Walk up steps independently ___ 24/7 supervision/assistance   ___ Walk up steps with assistance ___ Intermittent supervision/assistance  ___ Bathe/dress independently ___ Walk with walker     _x__ Bathe/dress with assistance ___ Walk Independently    ___ Shower independently ___ Walk with assistance    ___ Shower with assistance ___ No alcohol     ___ Return to work/school ________  Special Instructions: No driving smoking or alcohol  COMMUNITY REFERRALS UPON DISCHARGE:    Home Health:   PT  OT   RN                 Agency:[facility] Phone:[phone]   Medical Equipment/Items Ordered:HOSPITAL BED                                                 Agency/Supplier:[facility]   [phone]  HAS 24/7 CARE VIA FAMILY MEMBERS AND WILL CONTINUE AT DISCHARGE   My questions have been answered and I understand these instructions. I will adhere to these goals and the provided educational materials after my discharge from the hospital.  Patient/Caregiver Signature _______________________________ Date __________  Clinician Signature _______________________________________ Date __________  Please bring this form and your medication list with you to all your follow-up doctor's appointments.

## 2023-01-16 NOTE — Progress Notes (Signed)
Patient ID: Miki Kins, female   DOB: 1941-03-21, 82 y.o.   MRN: [id number] Met with the patient and Daughter Heili to review current situation, rehab process, team conference and plan of care. Reviewed secondary risk management including DM (A1 C 6.9), HLD on Pravachol (LDL 59/Trig 198) A-fib on Eliquis. Discussed dietary modification recommendations and medications. Patient with OSA; requested CPAP mask from home.  Constipation addressed and back pain addressed.  Daughter noted patient will need a hospital bed as all bedrooms upstairs; 2 level 2 ste left rail home. Forwarded info to SW for follow up. Continue to follow along to address educational needs to facilitate preparation for discharge. Margarito Liner

## 2023-01-16 NOTE — Evaluation (Signed)
Physical Therapy Assessment and Plan  Patient Details  Name: [name] MRN: [id number] Date of Birth: Oct 25, 1940  PT Diagnosis: Abnormality of gait, Coordination disorder, Difficulty walking, Impaired cognition, Low back pain, Muscle weakness, and Pain in joint Rehab Potential: Excellent ELOS: 5-7 days   Today's Date: 01/16/2023 PT Individual Time: [id number] PT Individual Time Calculation (min): 56 min    Hospital Problem: Principal Problem:   Acute respiratory failure   Past Medical History:  Past Medical History:  Diagnosis Date   Atrial fibrillation ([place])    Chronic back pain    "mid back down into lower back" (08/25/2014)   Diabetes mellitus without complication (HCC)    Frequent falls    GERD (gastroesophageal reflux disease)    Hypertriglyceridemia    OSA on CPAP    Osteoarthritis    "knees, hands, back" (08/25/2014)   Pneumonia ~ 2000 X 1   Subdural hematoma ([place]) july 2015   S/P fall while on Coumadin   T12 compression fracture ([place]) 2012   Type II diabetes mellitus ([place])    Past Surgical History:  Past Surgical History:  Procedure Laterality Date   APPENDECTOMY  2012   CATARACT EXTRACTION W/ INTRAOCULAR LENS  IMPLANT, BILATERAL Bilateral 2000's   IR GENERIC HISTORICAL  10/15/2016   IR PERCUTANEOUS ART THROMBECTOMY/INFUSION INTRACRANIAL INC DIAG ANGIO 10/15/2016 [name], MD [facility]   IR GENERIC HISTORICAL  12/13/2016   IR RADIOLOGIST EVAL & MGMT 12/13/2016 [facility]   RADIOLOGY WITH ANESTHESIA N/A 10/15/2016   Procedure: RADIOLOGY WITH ANESTHESIA;  Surgeon: [name], MD;  Location: [facility];  Service: Radiology;  Laterality: N/A;   TOTAL ABDOMINAL HYSTERECTOMY  1990    Assessment & Plan Clinical Impression: [name] is an 81-year right-handed [place] speaking female with history of diabetes mellitus, atrial fibrillation currently maintained on Eliquis, chronic back pain, hyperlipidemia subdural hematoma after fall 2015 while on  Coumadin.  Per chart review patient lives with her children.  Two-level home bed and bath upstairs.  Ambulates with a rollator in the home.  Independent with ADLs.  Presented 01/04/2023 with increasing difficulties breathing and a cough as well as fever of 103.  Chest x-ray with no acute process.  CT angiogram of the chest no pulmonary emboli identified.  Admission chemistries unremarkable except glucose 152, coronavirus negative, WBC 14,100, hemoglobin 9.8, blood cultures no growth to date, lactic acid 2.6.  Placed on broad-spectrum antibiotics initiated and completed as well as prednisone taper that she will remain on through 01/22/2023.  Echocardiogram with ejection fraction of 60 to 65% no wall motion abnormalities.  Still using nasal cannula oxygen to maintain saturations.  Tolerating a mechanical soft diet.  Therapy evaluations completed due to patient's debility decreased functional mobility was admitted for a comprehensive rehab program. Daughter says she uses Purewick at home at night. Patient transferred to CIR on 01/14/2023 .      Patient currently requires min with mobility secondary to muscle weakness, decreased cardiorespiratoy endurance, decreased memory, and decreased standing balance and decreased balance strategies.  Prior to hospitalization, patient was supervision with mobility and lived with Daughter, Family in a House home.  Home access is 2Stairs to enter.  Patient will benefit from skilled PT intervention to maximize safe functional mobility, minimize fall risk, and decrease caregiver burden for planned discharge home with 24 hour assist.  Anticipate patient will benefit from follow up OP at discharge.  PT - End of Session Activity Tolerance: Tolerates 30+ min activity with multiple rests  Endurance Deficit: Yes Endurance Deficit Description: Rest breaks required after ambulation PT Assessment Rehab Potential (ACUTE/IP ONLY): Excellent PT Barriers to Discharge: Home environment  access/layout;New oxygen PT Patient demonstrates impairments in the following area(s): Balance;Pain;Safety;Endurance PT Transfers Functional Problem(s): Bed Mobility;Bed to Chair;Car;Furniture PT Locomotion Functional Problem(s): Ambulation;Stairs PT Plan PT Intensity: Minimum of 1-2 x/day ,45 to 90 minutes PT Frequency: 5 out of 7 days PT Duration Estimated Length of Stay: 5-7 days PT Treatment/Interventions: Ambulation/gait training;Cognitive remediation/compensation;Discharge planning;DME/adaptive equipment instruction;Functional mobility training;Pain management;Splinting/orthotics;Therapeutic Activities;Psychosocial support;UE/LE Strength taining/ROM;Visual/perceptual remediation/compensation;Wheelchair propulsion/positioning;UE/LE Coordination activities;Therapeutic Exercise;Stair training;Skin care/wound management;Patient/family education;Neuromuscular re-education;Functional electrical stimulation;Disease management/prevention;Balance/vestibular training;Community reintegration PT Transfers Anticipated Outcome(s): supervision with LRAD PT Locomotion Anticipated Outcome(s): supervision household, CGA >150 with LRAD PT Recommendation Follow Up Recommendations: Outpatient PT Patient destination: Home Equipment Recommended: Other (comment) Equipment Details: pt owns necessary equipment, but family is wondering about a hospital bed.   PT Evaluation Precautions/Restrictions Precautions Precautions: Fall Precaution Comments: multiple falls, chronic L hemiplegia (L AFO ordered from [facility] 3/28 PM) Restrictions Weight Bearing Restrictions: No  Pain Pain Assessment Pain Scale: 0-10 Pain Score: 0-No pain Pain Interference Pain Interference Pain Effect on Sleep: 2. Occasionally Pain Interference with Therapy Activities: 3. Frequently Pain Interference with Day-to-Day Activities: 2. Occasionally Home Living/Prior Functioning Home Living Living Arrangements: Children;Other  relatives Available Help at Discharge: Family Type of Home: House Home Access: Stairs to enter [employer] of Steps: 2 Entrance Stairs-Rails: Left Home Layout: Two level;Able to live on main level with bedroom/bathroom Alternate Level Stairs-Number of Steps: 12 Alternate Level Stairs-Rails: Right Bathroom Shower/Tub: Tub/shower unit;Curtain Bathroom Toilet: Handicapped height Bathroom Accessibility: Yes Additional Comments: has a RW that she was using for ambulation, but also has rollator, used TTB, toilet push up rails, has BSC  Lives With: Daughter;Family Prior Function Level of Independence: Requires assistive device for independence;Needs assistance with ADLs Bath: Minimal  Able to Take Stairs?: Yes Driving: No Vision/Perception  Vision - History Ability to See in Adequate Light: 0 Adequate Perception Perception: Within Functional Limits Praxis Praxis: Intact  Cognition Overall Cognitive Status: History of cognitive impairments - at baseline Arousal/Alertness: Awake/alert Orientation Level: Oriented to person;Oriented to place;Oriented to situation;Oriented to time Memory: Impaired Awareness: Appears intact Problem Solving: Impaired Safety/Judgment: Appears intact Sensation Sensation Light Touch: Appears Intact Hot/Cold: Appears Intact Proprioception: Appears Intact Stereognosis: Not tested Additional Comments: per family report, pt has had vascular changes on LLE. 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 Motor  Motor Motor: Other (comment) Motor - Skilled Clinical Observations: 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   Trunk/Postural Assessment  Cervical Assessment Cervical Assessment: Within Functional Limits Thoracic  Assessment Thoracic Assessment: Within Functional Limits Lumbar Assessment Lumbar Assessment: Within Functional Limits Postural Control Postural Control: Within Functional Limits  Balance Balance Balance Assessed: Yes Static Sitting Balance Static Sitting - Balance Support: Feet unsupported Static Sitting - Level of Assistance: 5: Stand by assistance Dynamic Sitting Balance Dynamic Sitting - Balance Support: During functional activity Dynamic Sitting - Level of Assistance: 5: Stand by assistance Static Standing Balance Static Standing - Balance Support: During functional activity Static Standing - Level of Assistance: 4: Min assist Dynamic Standing Balance Dynamic Standing - Balance Support: During functional activity Dynamic Standing - Level of Assistance: 4: Min assist Extremity Assessment  RUE Assessment RUE Assessment: Exceptions to [facility] General Strength Comments: 4-/5  grossly LUE Assessment LUE Assessment: Exceptions to [facility] Active Range of Motion (AROM) Comments: Shoulder flexion 90 degrees, WFL distally General Strength Comments: 3+/5 proximally RLE Assessment RLE Assessment: Within Functional Limits General Strength Comments: grossly 4/5 LLE Assessment LLE Assessment: Exceptions to [facility] General Strength Comments: Grossly 4/5, but endurance deficits and family reports premorbid buckling, not noted on Eval  Care Tool Care Tool Bed Mobility Roll left and right activity   Roll left and right assist level: Contact Guard/Touching assist    Sit to lying activity   Sit to lying assist level: Minimal Assistance - Patient > 75%    Lying to sitting on side of bed activity   Lying to sitting on side of bed assist level: the ability to move from lying on the back to sitting on the side of the bed with no back support.: Minimal Assistance - Patient > 75%     Care Tool Transfers Sit to stand transfer   Sit to stand assist level: Contact Guard/Touching assist    Chair/bed  transfer   Chair/bed transfer assist level: Contact Guard/Touching assist     Toilet transfer   Assist Level: Minimal Assistance - Patient > 75%    Car transfer   Car transfer assist level: Minimal Assistance - Patient > 75%      Care Tool Locomotion Ambulation   Assist level: Minimal Assistance - Patient > 75% Assistive device: Walker-rolling Max distance: ~100 ft  Walk 10 feet activity   Assist level: Contact Guard/Touching assist Assistive device: Walker-rolling   Walk 50 feet with 2 turns activity   Assist level: Minimal Assistance - Patient > 75% Assistive device: Walker-rolling  Walk 150 feet activity Walk 150 feet activity did not occur: Safety/medical concerns      Walk 10 feet on uneven surfaces activity Walk 10 feet on uneven surfaces activity did not occur: Safety/medical concerns      Stairs   Assist level: Minimal Assistance - Patient > 75% Stairs assistive device: 2 hand rails Max number of stairs: 4  Walk up/down 1 step activity   Walk up/down 1 step (curb) assist level: Minimal Assistance - Patient > 75% Walk up/down 1 step or curb assistive device: 2 hand rails  Walk up/down 4 steps activity   Walk up/down 4 steps assist level: Maximal Assistance - Patient 25 - 49% Walk up/down 4 steps assistive device: 2 hand rails  Walk up/down 12 steps activity Walk up/down 12 steps activity did not occur: Safety/medical concerns      Pick up small objects from floor Pick up small object from the floor (from standing position) activity did not occur: Safety/medical concerns      Wheelchair Is the patient using a wheelchair?: Yes Type of Wheelchair: Manual   Wheelchair assist level: Maximal Assistance - Patient 25 - 49% (Attempted w/c mobility, but pt was not able to functionally coordinate to effectively propel chair) Max wheelchair distance: 10  Wheel 50 feet with 2 turns activity   Assist Level: Dependent - Patient 0%  Wheel 150 feet activity   Assist Level:  Dependent - Patient 0%    Refer to Care Plan for [place] 1 PT Short Term Goal 1 (Week 1): =LTGs d/t ELOS  Recommendations for other services: None   Skilled Therapeutic Intervention Evaluation completed (see details above) with patient education regarding purpose of PT evaluation, PT POC and goals, therapy schedule, weekly team meetings, and other CIR information including safety plan  and fall risk safety. Interpreter and pt's daughter present via facetime for improved communication. History gathered from pt's daughter, [place].  Pt performed the below functional mobility tasks with the specified levels of skilled cuing and assistance. Pt reports no pain this morning. Pt returned to room after session and remained in w/c, was left with all needs in reach and alarm active.    Mobility Bed Mobility Bed Mobility: Supine to Sit Supine to Sit: Minimal Assistance - Patient > 75% (min A for trunk elevation, daughter states pt does better when transfering to R side of bed) Transfers Transfers: Sit to Stand;Stand Pivot Transfers Sit to Stand: Contact Guard/Touching assist Stand Pivot Transfers: Contact Guard/Touching assist Transfer (Assistive device): Rolling walker Locomotion  Gait Ambulation: Yes Gait Assistance: Contact Guard/Touching assist Gait Distance (Feet): 100 Feet Assistive device: Rolling walker Gait Gait: Yes Gait Pattern: Impaired (shuffling gait pattern, L>R, pt was able to moderately correct with cueing.) Gait velocity: slowed Stairs / Additional Locomotion Stairs: Yes Stairs Assistance: Minimal Assistance - Patient > 75% Stair Management Technique: Two rails Number of Stairs: 4 Height of Stairs: 6 Wheelchair Mobility Wheelchair Mobility: Yes Wheelchair Assistance: Maximal Assistance - Patient 25 - 49% (Attempted but pt was unable to coordinate LUE d/t hx of stroke) Environmental health practitioner: Both upper extremities Wheelchair Parts Management:  Needs assistance Distance: 10   Discharge Criteria: Patient will be discharged from PT if patient refuses treatment 3 consecutive times without medical reason, if treatment goals not met, if there is a change in medical status, if patient makes no progress towards goals or if patient is discharged from hospital.  The above assessment, treatment plan, treatment alternatives and goals were discussed and mutually agreed upon: by patient  Mickel Fuchs 01/16/2023, 12:58 PM

## 2023-01-16 NOTE — Plan of Care (Signed)
  Problem: RH Balance Goal: LTG Patient will maintain dynamic standing with ADLs (OT) Description: LTG:  Patient will maintain dynamic standing balance with assist during activities of daily living (OT)  Flowsheets (Taken 01/16/2023 1226) LTG: Pt will maintain dynamic standing balance during ADLs with: Supervision/Verbal cueing   Problem: Sit to Stand Goal: LTG:  Patient will perform sit to stand in prep for activites of daily living with assistance level (OT) Description: LTG:  Patient will perform sit to stand in prep for activites of daily living with assistance level (OT) Flowsheets (Taken 01/16/2023 1226) LTG: PT will perform sit to stand in prep for activites of daily living with assistance level: Supervision/Verbal cueing   Problem: RH Bathing Goal: LTG Patient will bathe all body parts with assist levels (OT) Description: LTG: Patient will bathe all body parts with assist levels (OT) Flowsheets (Taken 01/16/2023 1226) LTG: Pt will perform bathing with assistance level/cueing: Supervision/Verbal cueing   Problem: RH Dressing Goal: LTG Patient will perform upper body dressing (OT) Description: LTG Patient will perform upper body dressing with assist, with/without cues (OT). Flowsheets (Taken 01/16/2023 1226) LTG: Pt will perform upper body dressing with assistance level of: Set up assist Goal: LTG Patient will perform lower body dressing w/assist (OT) Description: LTG: Patient will perform lower body dressing with assist, with/without cues in positioning using equipment (OT) Flowsheets (Taken 01/16/2023 1226) LTG: Pt will perform lower body dressing with assistance level of: Supervision/Verbal cueing   Problem: RH Toileting Goal: LTG Patient will perform toileting task (3/3 steps) with assistance level (OT) Description: LTG: Patient will perform toileting task (3/3 steps) with assistance level (OT)  Flowsheets (Taken 01/16/2023 1226) LTG: Pt will perform toileting task (3/3 steps) with  assistance level: Supervision/Verbal cueing   Problem: RH Toilet Transfers Goal: LTG Patient will perform toilet transfers w/assist (OT) Description: LTG: Patient will perform toilet transfers with assist, with/without cues using equipment (OT) Flowsheets (Taken 01/16/2023 1226) LTG: Pt will perform toilet transfers with assistance level of: Supervision/Verbal cueing   Problem: RH Tub/Shower Transfers Goal: LTG Patient will perform tub/shower transfers w/assist (OT) Description: LTG: Patient will perform tub/shower transfers with assist, with/without cues using equipment (OT) Flowsheets (Taken 01/16/2023 1226) LTG: Pt will perform tub/shower stall transfers with assistance level of: Supervision/Verbal cueing   Problem: RH Memory Goal: LTG Patient will demonstrate ability for day to day recall/carry over during activities of daily living with assistance level (OT) Description: LTG:  Patient will demonstrate ability for day to day recall/carry over during activities of daily living with assistance level (OT). Flowsheets (Taken 01/16/2023 1226) LTG:  Patient will demonstrate ability for day to day recall/carry over during activities of daily living with assistance level (OT): Supervision

## 2023-01-16 NOTE — Progress Notes (Signed)
Occupational Therapy Session Note  Patient Details  Name: [name] MRN: [id number] Date of Birth: 1941-09-21  Today's Date: 01/16/2023 OT Individual Time: 1418-1530 OT Individual Time Calculation (min): 72 min    Short Term Goals: Week 1:  OT Short Term Goal 1 (Week 1): STG = LTG due to ELOS  Skilled Therapeutic Interventions/Progress Updates:  Patient agreeable to participate in OT session. Reports 0/10 pain level.   Patient participated in skilled OT session focusing on ADL re-training, functional transfers, functional mobility, endurance, and sitting/standing balance. Daughter present during session and interpreted for session. Therapist facilitated session while educating pt on hand placement and technique during sit<>stand transitions in order to improve/facilitate safety awareness and decrease risk of falls during functional transfers. Pt completed shower while seated on shower chair. Min A provided for LB bathing (lower legs and feet). VC provided for thoroughness during bathing and set up of soap on washcloth.  Pt sat in w/c at sink to complete dressing. Min A provided for UB dressing (bra) and LB dressing (shoes). Pt educated on placing her left leg (weaker leg) into pants/depends first before right for less difficulty. Pt was able to demonstrate carry over of education and thread both legs into pants without assistance! Education provided on use of sock aid to don Left socks. Min A provided for use due to decreased strength. Pt able to don right sock without assistance. Left hip is tight which prevents her from being able to cross it over knee.   Functional mobility completed to/from pt's room and Day Room. Min guard provided while pt navigated in hallway using RW.  Pt able to make 50% of the distance both directions before requesting to sit and be pushed the remainder in w/c. VC and visual cues provided for proper use of RW and recommended distance away from RW when walking. Pt required  cues 75% of the time.   Dynamic standing balance task completed while standing on Airdex cushion using RW. Pt placed Squigz on vertical surface using her RUE. Pt then removed all Squigz using both RUE and LUE. Min Guard provided for safety with VC provided on safe standing stance in order to maintain balance.  Dynamic sitting balance task completed while stacking cones sorted by color onto appropriate cone stack placed on floor in semi circle shape, in front of pt's w/c.  Min Guard provided for safety and balance.   Pt requested to lay down at end of session. Std pvt transfer completed with Min guard using RW. Pt able to transition from sitting to supine with SBA.   Therapy Documentation Precautions:  Precautions Precautions: Fall Precaution Comments: multiple falls, chronic L hemiplegia (L AFO ordered from [facility] 3/28 PM) Restrictions Weight Bearing Restrictions: No   Therapy/Group: Individual Therapy  Ailene Ravel, OTR/L,CBIS  Supplemental OT - [facility] and [facility] Secure Chat Preferred   01/16/2023, 12:29 PM

## 2023-01-16 NOTE — Progress Notes (Signed)
Inpatient Rehabilitation  Patient information reviewed and entered into eRehab system by Scottie Stanish Evamae Rowen, OTR/L, Rehab Quality Coordinator.   Information including medical coding, functional ability and quality indicators will be reviewed and updated through discharge.   

## 2023-01-16 NOTE — Progress Notes (Signed)
Inpatient Rehabilitation Center Individual Statement of Services  Patient Name:  Arbadella Waag  Date:  01/16/2023  Welcome to the [facility].  Our goal is to provide you with an individualized program based on your diagnosis and situation, designed to meet your specific needs.  With this comprehensive rehabilitation program, you will be expected to participate in at least 3 hours of rehabilitation therapies Monday-Friday, with modified therapy programming on the weekends.  Your rehabilitation program will include the following services:  Physical Therapy (PT), Occupational Therapy (OT), 24 hour per day rehabilitation nursing, Therapeutic Recreaction (TR), Care Coordinator, Rehabilitation Medicine, Nutrition Services, and Pharmacy Services  Weekly team conferences will be held on Wednesday to discuss your progress.  Your Inpatient Rehabilitation Care Coordinator will talk with you frequently to get your input and to update you on team discussions.  Team conferences with you and your family in attendance may also be held.  Expected length of stay: 5-7 Days  Overall anticipated outcome: CGA level  Depending on your progress and recovery, your program may change. Your Inpatient Rehabilitation Care Coordinator will coordinate services and will keep you informed of any changes. Your Inpatient Rehabilitation Care Coordinator's name and contact numbers are listed  below.  The following services may also be recommended but are not provided by the [facility]:   [place] will be made to provide these services after discharge if needed.  Arrangements include referral to agencies that provide these services.  Your insurance has been verified to be:  medicare & Medicaid Your primary doctor is:  Haydee Salter  Pertinent information will be shared with your doctor and your insurance  company.  Inpatient Rehabilitation Care Coordinator:  Ovidio Kin, [place] or Emilia Beck  Information discussed with and copy given to patient by: Elease Hashimoto, 01/16/2023, 10:57 AM

## 2023-01-16 NOTE — Evaluation (Signed)
Occupational Therapy Assessment and Plan  Patient Details  Name: [name] MRN: [id number] Date of Birth: 06/16/1941  OT Diagnosis: acute pain, cognitive deficits, and muscle weakness (generalized) Rehab Potential: Rehab Potential (ACUTE ONLY): Good ELOS: 5-7 days   Today's Date: 01/16/2023 OT Individual Time: 0904-1000 OT Individual Time Calculation (min): 56 min     Hospital Problem: Principal Problem:   Acute respiratory failure   Past Medical History:  Past Medical History:  Diagnosis Date   Atrial fibrillation ([place])    Chronic back pain    "mid back down into lower back" (08/25/2014)   Diabetes mellitus without complication (HCC)    Frequent falls    GERD (gastroesophageal reflux disease)    Hypertriglyceridemia    OSA on CPAP    Osteoarthritis    "knees, hands, back" (08/25/2014)   Pneumonia ~ 2000 X 1   Subdural hematoma ([place]) july 2015   S/P fall while on Coumadin   T12 compression fracture ([place]) 2012   Type II diabetes mellitus ([place])    Past Surgical History:  Past Surgical History:  Procedure Laterality Date   APPENDECTOMY  2012   CATARACT EXTRACTION W/ INTRAOCULAR LENS  IMPLANT, BILATERAL Bilateral 2000's   IR GENERIC HISTORICAL  10/15/2016   IR PERCUTANEOUS ART THROMBECTOMY/INFUSION INTRACRANIAL INC DIAG ANGIO 10/15/2016 [name], MD [facility]   IR GENERIC HISTORICAL  12/13/2016   IR RADIOLOGIST EVAL & MGMT 12/13/2016 [facility]   RADIOLOGY WITH ANESTHESIA N/A 10/15/2016   Procedure: RADIOLOGY WITH ANESTHESIA;  Surgeon: [name], MD;  Location: [facility];  Service: Radiology;  Laterality: N/A;   TOTAL ABDOMINAL HYSTERECTOMY  1990    Assessment & Plan Clinical Impression:  [name] is an 81-year right-handed [place] speaking female with history of diabetes mellitus, atrial fibrillation currently maintained on Eliquis, chronic back pain, hyperlipidemia subdural hematoma after fall 2015 while on Coumadin.  Per chart review patient lives  with her children.  Two-level home bed and bath upstairs.  Ambulates with a rollator in the home.  Independent with ADLs.  Presented 01/04/2023 with increasing difficulties breathing and a cough as well as fever of 103.  Chest x-ray with no acute process.  CT angiogram of the chest no pulmonary emboli identified.  Admission chemistries unremarkable except glucose 152, coronavirus negative, WBC 14,100, hemoglobin 9.8, blood cultures no growth to date, lactic acid 2.6.  Placed on broad-spectrum antibiotics initiated and completed as well as prednisone taper that she will remain on through 01/22/2023.  Echocardiogram with ejection fraction of 60 to 65% no wall motion abnormalities.  Still using nasal cannula oxygen to maintain saturations.  Tolerating a mechanical soft diet.  Therapy evaluations completed due to patient's debility decreased functional mobility was admitted for a comprehensive rehab program. Daughter says she uses Purewick at home at night. Patient transferred to CIR on 01/14/2023 .    Patient currently requires min with basic self-care skills secondary to muscle weakness, decreased cardiorespiratoy endurance, decreased coordination, decreased problem solving, and decreased standing balance.  Prior to hospitalization, patient could complete self-care with supervision for functional ambulation, min A for showers.  Patient will benefit from skilled intervention to decrease level of assist with basic self-care skills and increase independence with basic self-care skills prior to discharge home with care partner.  Anticipate patient will require 24 hour supervision and follow up home health.  OT - End of Session Activity Tolerance: Tolerates 10 - 20 min activity with multiple rests Endurance Deficit: Yes Endurance Deficit Description: Rest  breaks required after ambulation OT Assessment Rehab Potential (ACUTE ONLY): Good OT Barriers to Discharge: Home environment access/layout OT Patient  demonstrates impairments in the following area(s): Balance;Cognition;Endurance;Motor;Pain;Safety OT Basic ADL's Functional Problem(s): Bathing;Dressing;Toileting OT Transfers Functional Problem(s): Toilet;Tub/Shower OT Additional Impairment(s): None OT Plan OT Intensity: Minimum of 1-2 x/day, 45 to 90 minutes OT Frequency: 5 out of 7 days OT Duration/Estimated Length of Stay: 5-7 days OT Treatment/Interventions: Balance/vestibular training;Discharge planning;Pain management;Self Care/advanced ADL retraining;Therapeutic Activities;UE/LE Coordination activities;Disease mangement/prevention;Functional mobility training;Patient/family education;Therapeutic Exercise;DME/adaptive equipment instruction;Neuromuscular re-education;UE/LE Strength taining/ROM OT Self Feeding Anticipated Outcome(s): Set up A OT Basic Self-Care Anticipated Outcome(s): Supervision OT Toileting Anticipated Outcome(s): Supervision OT Bathroom Transfers Anticipated Outcome(s): Supervision OT Recommendation Recommendations for Other Services: Therapeutic Recreation consult Therapeutic Recreation Interventions: Pet therapy Patient destination: Home Follow Up Recommendations: Home health OT;24 hour supervision/assistance Equipment Recommended: To be determined   OT Evaluation Precautions/Restrictions  Precautions Precautions: Fall Precaution Comments: multiple falls, chronic L hemiplegia (L AFO ordered from [facility] 3/28 PM) Restrictions Weight Bearing Restrictions: No Home Living/Prior Functioning Home Living Family/patient expects to be discharged to:: Private residence Living Arrangements: Children Available Help at Discharge: Family Type of Home: House Home Access: Stairs to enter Technical brewer of Steps: 2 Entrance Stairs-Rails: Left Home Layout: Two level, Able to live on main level with bedroom/bathroom (pt lives on main floor) Bathroom Shower/Tub: Public librarian, Architectural technologist: Handicapped  height Bathroom Accessibility: Yes Additional Comments: has a RW that she was using for ambulation, but also has rollator, used TTB, toilet push up rails, has BSC  Lives With: Daughter, Family (daughter (her husband, 4 kids)) IADL History Homemaking Responsibilities: No Current License: No Prior Function Level of Independence: Requires assistive device for independence, Needs assistance with ADLs Bath: Minimal  Able to Take Stairs?: Yes Driving: No Vision Baseline Vision/History: 1 Wears glasses (readers) Ability to See in Adequate Light: 0 Adequate Patient Visual Report: No change from baseline Vision Assessment?: No apparent visual deficits Perception  Perception: Within Functional Limits Praxis Praxis: Intact Cognition Cognition Overall Cognitive Status: History of cognitive impairments - at baseline Arousal/Alertness: Awake/alert Orientation Level: Person;Place;Situation Person: Oriented Place: Oriented Situation: Oriented Memory: Impaired Awareness: Appears intact Problem Solving: Impaired Safety/Judgment: Appears intact Brief Interview for Mental Status (BIMS) Repetition of Three Words (First Attempt): 3 Temporal Orientation: Year: Correct Temporal Orientation: Month: Accurate within 5 days Temporal Orientation: Day: Correct Recall: "Sock": Yes, no cue required Recall: "Blue": Yes, after cueing ("a color") Recall: "Bed": Yes, no cue required BIMS Summary Score: 14 Sensation Sensation Light Touch: Impaired by gross assessment Hot/Cold: Appears Intact Proprioception: Appears Intact Stereognosis: Not tested Additional Comments: per family report, pt has had vascular changes on LLE. 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 Motor   Motor Motor: Other (comment) Motor - Skilled Clinical Observations: 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  Trunk/Postural Assessment  Cervical Assessment Cervical Assessment: Within Functional Limits Thoracic Assessment Thoracic Assessment: Within Functional Limits Lumbar Assessment Lumbar Assessment: Within Functional Limits Postural Control Postural Control: Within Functional Limits  Balance Balance Balance Assessed: Yes Static Sitting Balance Static Sitting - Balance Support: Feet unsupported Static Sitting - Level of Assistance: 5: Stand by assistance Dynamic Sitting Balance Dynamic Sitting - Balance Support: During functional activity Dynamic Sitting - Level of Assistance: 5: Stand by assistance Static Standing Balance Static Standing - Balance Support:  During functional activity Static Standing - Level of Assistance: 4: Min assist Dynamic Standing Balance Dynamic Standing - Balance Support: During functional activity Dynamic Standing - Level of Assistance: 4: Min assist Extremity/Trunk Assessment RUE Assessment RUE Assessment: Exceptions to [facility] General Strength Comments: 4-/5 grossly LUE Assessment LUE Assessment: Exceptions to [facility] Active Range of Motion (AROM) Comments: Shoulder flexion 90 degrees, WFL distally General Strength Comments: 3+/5 proximally  Care Tool Care Tool Self Care Eating   Eating Assist Level: Set up assist    Oral Care    Oral Care Assist Level: Supervision/Verbal cueing    Bathing   Body parts bathed by patient: Right arm;Left arm;Chest;Abdomen;Front perineal area;Right upper leg;Left upper leg;Face Body parts bathed by helper: Buttocks;Right lower leg;Left lower leg   Assist Level: Minimal Assistance - Patient > 75%    Upper Body Dressing(including orthotics)   What is the patient wearing?: Pull over shirt;Bra   Assist Level: Minimal Assistance - Patient > 75%    Lower Body Dressing (excluding footwear)   What is the patient wearing?:  Pants;Underwear/pull up Assist for lower body dressing: Moderate Assistance - Patient 50 - 74%    Putting on/Taking off footwear   What is the patient wearing?: Socks;Shoes Assist for footwear: Total Assistance - Patient < 25%       Care Tool Toileting Toileting activity   Assist for toileting: Minimal Assistance - Patient > 75%     Care Tool Bed Mobility Roll left and right activity        Sit to lying activity        Lying to sitting on side of bed activity         Care Tool Transfers Sit to stand transfer   Sit to stand assist level: Minimal Assistance - Patient > 75%    Chair/bed transfer         Toilet transfer   Assist Level: Minimal Assistance - Patient > 75%     Care Tool Cognition  Expression of Ideas and Wants Expression of Ideas and Wants: 4. Without difficulty (complex and basic) - expresses complex messages without difficulty and with speech that is clear and easy to understand  Understanding Verbal and Non-Verbal Content Understanding Verbal and Non-Verbal Content: 3. Usually understands - understands most conversations, but misses some part/intent of message. Requires cues at times to understand   Memory/Recall Ability Memory/Recall Ability : Current season;That he or she is in a hospital/hospital unit   Refer to Care Plan for [place] 1 OT Short Term Goal 1 (Week 1): STG = LTG due to ELOS  Recommendations for other services: Therapeutic Recreation  Pet therapy   Skilled Therapeutic Intervention Patient received seated in recliner upon therapy arrival and agreeable to participate in OT evaluation. Education provided on OT purpose, therapy schedule, goals for therapy, and safety policy while in rehab. Interpreter, family present (also able to interpret), along with nursing and MD present assessing pt due to coughing/choking episode with consuming morning meds. MD recommending SLP eval to determine reasoning for swallowing  dysfunction. No pain reported by pt. Pt received on RA, with SPO2 96% or greater during session therefore remained on RA.   Patient demonstrates frequent posterior LOB especially during dynamic standing task, along with endurance, cardiorespiratory and coordination deficits with mild cognitive impairments (at baseline), resulting in difficulty completing BADL tasks without increased physical assist. Pt will benefit from skilled OT services to focus on mentioned deficits. See below  for ADL and functional transfer performance. Pt remained seated in recliner at conclusion of session with family present and all needs met at end of session.   ADL ADL Eating: Set up Where Assessed-Eating: Chair Grooming: Setup Where Assessed-Grooming: Chair Upper Body Bathing: Supervision/safety Where Assessed-Upper Body Bathing: Chair Lower Body Bathing: Minimal assistance Where Assessed-Lower Body Bathing: Chair Upper Body Dressing: Minimal assistance Where Assessed-Upper Body Dressing: Chair Lower Body Dressing: Moderate assistance Where Assessed-Lower Body Dressing: Chair Toileting: Minimal assistance Where Assessed-Toileting: Glass blower/designer: Psychiatric nurse Method: Ambulating (RW) Science writer: Raised toilet seat Tub/Shower Transfer: Unable to assess Tub/Shower Transfer Method: Unable to assess Social research officer, government: Unable to assess Social research officer, government Method: Unable to assess Mobility  Bed Mobility Bed Mobility: Supine to Sit Supine to Sit: Minimal Assistance - Patient > 75% (min A for trunk elevation, daughter states pt does better when transfering to R side of bed) Transfers Sit to Stand: Contact Guard/Touching assist   Discharge Criteria: Patient will be discharged from OT if patient refuses treatment 3 consecutive times without medical reason, if treatment goals not met, if there is a change in medical status, if patient makes no progress towards  goals or if patient is discharged from hospital.  The above assessment, treatment plan, treatment alternatives and goals were discussed and mutually agreed upon: by patient and by family  Blase Mess, MS, OTR/L  01/16/2023, 2:18 PM

## 2023-01-17 DIAGNOSIS — I951 Orthostatic hypotension: Secondary | ICD-10-CM

## 2023-01-17 DIAGNOSIS — I4891 Unspecified atrial fibrillation: Secondary | ICD-10-CM

## 2023-01-17 DIAGNOSIS — J9600 Acute respiratory failure, unspecified whether with hypoxia or hypercapnia: Secondary | ICD-10-CM | POA: Diagnosis not present

## 2023-01-17 DIAGNOSIS — E119 Type 2 diabetes mellitus without complications: Secondary | ICD-10-CM | POA: Diagnosis not present

## 2023-01-17 DIAGNOSIS — R1310 Dysphagia, unspecified: Secondary | ICD-10-CM | POA: Diagnosis not present

## 2023-01-17 LAB — GLUCOSE, CAPILLARY
Glucose-Capillary: 111 mg/dL — ABNORMAL HIGH (ref 70–99)
Glucose-Capillary: 147 mg/dL — ABNORMAL HIGH (ref 70–99)
Glucose-Capillary: 159 mg/dL — ABNORMAL HIGH (ref 70–99)
Glucose-Capillary: 236 mg/dL — ABNORMAL HIGH (ref 70–99)

## 2023-01-17 MED ORDER — METOPROLOL TARTRATE 25 MG PO TABS
25.0000 mg | ORAL_TABLET | Freq: Two times a day (BID) | ORAL | Status: DC
  Administered 2023-01-17 – 2023-01-21 (×8): 25 mg via ORAL
  Filled 2023-01-17 (×8): qty 1

## 2023-01-17 NOTE — Progress Notes (Signed)
Occupational Therapy Session Note  Patient Details  Name: [name] MRN: [id number] Date of Birth: Mar 15, 1941  Today's Date: 01/17/2023 OT Individual Time: 1350-1500 OT Individual Time Calculation (min): 70 min    Short Term Goals: Week 1:  OT Short Term Goal 1 (Week 1): STG = LTG due to ELOS  Skilled Therapeutic Interventions/Progress Updates:    Pt received in the w/c with her daughter present and translating. She had no c/o pain but reported fatigue throughout the session. Skilled monitoring of vitals throughout session to ensure hemodynamic and cardiorespiratory stability. She was agreeable to take shower. She completed functional mobility into the bathroom with the Rw with CGA, requiring cueing for RW management/positioning. She transferred to the shower chair and required min A to doff LB clothing distally, reporting dizziness with forward bending. She completed UB bathing and hair washing with (S). LB bathing with min A overall in standing using grab bar. She completed transfer back to the w/c with CGA using the RW. She donned a shirt with (S). LB dressing with min A. She completed hair care with set up assist. She completed 100 ft of functional mobility with the RW with CGA overall before reporting fatigue and need to sit. Attempted to monitor SpO2 but had inconsistent, very poor readings d/t pt's fingers being cold. She took several seated rest breaks before completing two more trials of 100 ft of mobility. Functional mobility completed to simulate home distance to increase functional activity tolerance to maximize participation in IADLs. Pt was left supine with all needs met, bed alarm set.    BP: Seated: 122/80 Standing: 112/67 In shower following: 128/77 Following shower: 114/73  Therapy Documentation Precautions:  Precautions Precautions: Fall Precaution Comments: multiple falls, chronic L hemiplegia (L AFO ordered from [facility] 3/28 PM) Restrictions Weight Bearing Restrictions:  No  Therapy/Group: Individual Therapy  Curtis Sites 01/17/2023, 6:25 AM

## 2023-01-17 NOTE — Progress Notes (Signed)
Physical Therapy Session Note  Patient Details  Name: [name] MRN: [id number] Date of Birth: 1941/06/17  Today's Date: 01/17/2023 PT Individual Time: 0800-0917 PT Individual Time Calculation (min): 77 min   Short Term Goals: Week 1:  PT Short Term Goal 1 (Week 1): =LTGs d/t ELOS  Skilled Therapeutic Interventions/Progress Updates:     Pt supine in bed with HOB elevated upon arrival. Pt performed supine <> sit with HOB elevated with supervision with use of bed rail, and verbal cues provided to push up on forearm. Pt SOB, oxygen sat 88% upon sitting EOB but increased to 93 with diaphragmatic breathing. Pt daughter in the room. Pt agreeable to therapy. Pt denies any pain. Pt reports need to use restroom.   Pt performed sit<>stand with RW and CGA. Pt ambulated from bed to bathroom with RW and CGA, verbal cuing provided to walk within walker for safety. Pt continent of bladder (documented in flowsheets), pt performed pericare and doffed/donned pants with CGA for stability for intermittent posterior lean. Pt ambulated from bathroom to sink with RW and CGA, pt washed hands in standing position with CGA with intermittent UE support on RW. Pt initally reports wanting to walk to gym then within a few seconds reports dizziness and not feeling well. Pt performed stand<>sit to WC with CGA. Therapist assessed BP for orthostatic hypotension, seated BP 118/76 HR 65, standing BP 108/67 HR 55. Verbally notified nursing of orthostatic hypotension, and dizziness. Therapist donned thigh high ted hose with pt seated in chair. Pt performed STS x2 with CGA, while therapist and pt daughter pulled thigh highs over thighs. Pt reports increased dizziness with standing but decreased with sitting. MD arrived in room, verbally notified of pt BP and symptoms, therapist requested order for abdominal binder. Pt reports she feels better while seated in WC. Performed seated therex: 1x10 LAQ, 1x10 active assisted seated marching. Pt  oxygen decreased to 66, and increased to 93 with diaphragmatic breathing.   Pt requested to go to bathroom, and reports she feels okay to stand. Pt performed stand pivot transfer with B UE support on handrails and CGA. Therapist doffed/donned pants dependently, pt continent of bladder (documented in flowsheets), and performed pericare with CGA. Pt denies any dizziness. Pt performed stand step transfer with RW and CGA WC to bed. Pt performed sit<>supine with min A from daughter. Therapist elevated legs. Pt supine in bed with legs elevated at end of session, with bed alarm on, daughter in room and all needs within reach.   Therapy Documentation Precautions:  Precautions Precautions: Fall Precaution Comments: multiple falls, chronic L hemiplegia (L AFO ordered from [facility] 3/28 PM) Restrictions Weight Bearing Restrictions: No     Therapy/Group: Individual Therapy  [facility] Alfredia Ferguson, [place], DPT  01/17/2023, 7:36 AM

## 2023-01-17 NOTE — Progress Notes (Signed)
PROGRESS NOTE   Subjective/Complaints: Coughing improved today. She does have dizziness with standing today, noted to have decrease in BP with standing with PT.  ROS: +cough-improved.  Denies chest pain shortness of breath, abdominal pain, nausea, vomiting. + dizziness with standing  Objective:   No results found. Recent Labs    01/15/23 0607 01/16/23 0514  WBC 9.5 10.0  HGB 10.1* 10.2*  HCT 30.6* 31.9*  PLT 468* 479*    Recent Labs    01/15/23 0607 01/16/23 0514  NA 140 141  K 3.8 4.1  CL 99 102  CO2 30 27  GLUCOSE 104* 125*  BUN 11 13  CREATININE 0.78 0.88  CALCIUM 9.0 8.9     Intake/Output Summary (Last 24 hours) at 01/17/2023 1401 Last data filed at 01/17/2023 1316 Gross per 24 hour  Intake 480 ml  Output 800 ml  Net -320 ml         Physical Exam: Vital Signs Blood pressure 114/64, pulse 60, temperature 98.5 F (36.9 C), temperature source Oral, resp. rate 16, height 4\' 8"  (1.422 m), weight 61 kg, SpO2 99 %. Gen: no distress, normal appearing HEENT: oral mucosa pink and moist, NCAT Cardio: Reg rate Chest: CTAB, normal effort, normal rate of breathing, no cough noted while I was there Abd: soft, non-distended, +BS, NT Ext: no edema Psych: pleasant, normal affect Skin: intact Physical Exam Neurological:     Comments: Patient is alert/frail.  Makes eye contact with examiner.  Non-English speaking.  Follows simple demonstrated commands. No focal deficits   Assessment/Plan: 1. Functional deficits which require 3+ hours per day of interdisciplinary therapy in a comprehensive inpatient rehab setting. Physiatrist is providing close team supervision and 24 hour management of active medical problems listed below. Physiatrist and rehab team continue to assess barriers to discharge/monitor patient progress toward functional and medical goals  Care Tool:  Bathing    Body parts bathed by patient: Right  arm, Left arm, Chest, Abdomen, Front perineal area, Right upper leg, Left upper leg, Face   Body parts bathed by helper: Buttocks, Right lower leg, Left lower leg     Bathing assist Assist Level: Minimal Assistance - Patient > 75%     Upper Body Dressing/Undressing Upper body dressing   What is the patient wearing?: Pull over shirt, Bra    Upper body assist Assist Level: Minimal Assistance - Patient > 75%    Lower Body Dressing/Undressing Lower body dressing      What is the patient wearing?: Pants, Underwear/pull up     Lower body assist Assist for lower body dressing: Moderate Assistance - Patient 50 - 74%     Toileting Toileting    Toileting assist Assist for toileting: Minimal Assistance - Patient > 75%     Transfers Chair/bed transfer  Transfers assist     Chair/bed transfer assist level: Contact Guard/Touching assist     Locomotion Ambulation   Ambulation assist      Assist level: Minimal Assistance - Patient > 75% Assistive device: Walker-rolling Max distance: ~100 ft   Walk 10 feet activity   Assist     Assist level: Contact Guard/Touching assist Assistive device: Walker-rolling  Walk 50 feet activity   Assist    Assist level: Minimal Assistance - Patient > 75% Assistive device: Walker-rolling    Walk 150 feet activity   Assist Walk 150 feet activity did not occur: Safety/medical concerns         Walk 10 feet on uneven surface  activity   Assist Walk 10 feet on uneven surfaces activity did not occur: Safety/medical concerns         Wheelchair     Assist Is the patient using a wheelchair?: Yes Type of Wheelchair: Manual    Wheelchair assist level: Maximal Assistance - Patient 25 - 49% (Attempted w/c mobility, but pt was not able to functionally coordinate to effectively propel chair) Max wheelchair distance: 10    Wheelchair 50 feet with 2 turns activity    Assist        Assist Level: Dependent -  Patient 0%   Wheelchair 150 feet activity     Assist      Assist Level: Dependent - Patient 0%   Blood pressure 114/64, pulse 60, temperature 98.5 F (36.9 C), temperature source Oral, resp. rate 16, height 4\' 8"  (1.422 m), weight 61 kg, SpO2 99 %.  Medical Problem List and Plan: 1. Functional deficits secondary to acute respiratory failure exacerbation of underlying subacute or chronic lung disease.  Prednisone 40 mg daily through 01/22/2023 tapering by 10 mg weekly             -patient may shower             -ELOS/Goals: 10-14 days              -Continue CIR  -Team meeting tomorrow 2.  Antithrombotics: -DVT/anticoagulation:  Pharmaceutical: Eliquis             -antiplatelet therapy: N/A 3. Pain Management: Lidoderm patch, Tylenol as needed 4. Mood/Behavior/Sleep: Cymbalta 30 mg nightly             -antipsychotic agents: N/A 5. Neuropsych/cognition: This patient is capable of making decisions on her own behalf. 6. Skin/Wound Care: Routine skin checks 7. Fluids/Electrolytes/Nutrition: Routine in and outs with follow-up chemistries 8.  Atrial fibrillation.  Continue Eliquis.  Lopressor 25 mg 3 times daily, cardiac rate controlled  -4/2 Monitor HR with decrease in Lopressor to 25mg  BID 9.  Hyperlipidemia.  Pravachol 10.  Diabetes mellitus.  SSI.  Monitor while on prednisone. Magnesium reviewed and is normal.   -4/2 Controlled overall, continue to follow CBG (last 3)  Recent Labs    01/16/23 2005 01/17/23 0621 01/17/23 1134  GLUCAP 206* 111* 147*     11. Obesity: provide dietary education.  12. Nocturia: placed ordered that she may use purewick at night 13. Anemia: iron and B12 reviewed and are normal  -4/1 HGB up to 10.2 today 14. Cough: prn robitussin ordered 15. Suboptimal potassium: klor 68meq ordered x1 on 3/31  -4/1 K+ stable at 4.1 today 16.  Dysphagia.   -Other patient had MBS and esophagram during acute admission she reports continued issues with coughing   after swallowing thin liquids.  Will place SLP consult for further evaluation -4/2 SLP eval today 17. Seasonal allergies  -4/1 Start loratadine 18. Hypotension, orthostatic  -4/2 Decrease metoprolol from 25mg  TID to 25mg  BI, order abd binder, compression stockings    LOS: 3 days A FACE TO FACE EVALUATION WAS PERFORMED  Jennye Boroughs 01/17/2023, 2:01 PM

## 2023-01-17 NOTE — IPOC Note (Signed)
Overall Plan of Care [facility]) Patient Details Name: [name] MRN: [id number] DOB: Nov 12, 1940  Admitting Diagnosis: Acute respiratory failure  Hospital Problems: Principal Problem:   Acute respiratory failure     Functional Problem List: Nursing Safety, Endurance, Medication Management, Nutrition, Pain, Bowel  PT Balance, Pain, Safety, Endurance  OT Balance, Cognition, Endurance, Motor, Pain, Safety  SLP    TR         Basic ADL's: OT Bathing, Dressing, Toileting     Advanced  ADL's: OT       Transfers: PT Bed Mobility, Bed to Chair, Car, Manufacturing systems engineer, Metallurgist: PT Ambulation, Stairs     Additional Impairments: OT None  SLP None      TR      Anticipated Outcomes Item Anticipated Outcome  Self Feeding Set up A  Swallowing      Basic self-care  Supervision  Toileting  Supervision   Bathroom Transfers Supervision  Bowel/Bladder  manage bowel w mod I assist  Transfers  supervision with LRAD  Locomotion  supervision household, CGA >150 with LRAD  Communication     Cognition     Pain  < 4 with prns  Safety/Judgment  manage safety w cues   Therapy Plan: PT Intensity: Minimum of 1-2 x/day ,45 to 90 minutes PT Frequency: 5 out of 7 days PT Duration Estimated Length of Stay: 5-7 days OT Intensity: Minimum of 1-2 x/day, 45 to 90 minutes OT Frequency: 5 out of 7 days OT Duration/Estimated Length of Stay: 5-7 days SLP Duration/Estimated Length of Stay: d/c SLP   Team Interventions: Nursing Interventions Pain Management, Bowel Management, Medication Management, Dysphagia/Aspiration Precaution Training, Discharge Planning, Disease Management/Prevention, Patient/Family Education  PT interventions Ambulation/gait training, Cognitive remediation/compensation, Discharge planning, DME/adaptive equipment instruction, Functional mobility training, Pain management, Splinting/orthotics, Therapeutic Activities, Psychosocial support, UE/LE  Strength taining/ROM, Visual/perceptual remediation/compensation, Wheelchair propulsion/positioning, UE/LE Coordination activities, Therapeutic Exercise, Stair training, Skin care/wound management, Patient/family education, Neuromuscular re-education, Functional electrical stimulation, Disease management/prevention, Training and development officer, Community reintegration  OT Interventions Training and development officer, Discharge planning, Pain management, Self Care/advanced ADL retraining, Therapeutic Activities, UE/LE Coordination activities, Disease mangement/prevention, Functional mobility training, Patient/family education, Therapeutic Exercise, DME/adaptive equipment instruction, Neuromuscular re-education, UE/LE Strength taining/ROM  SLP Interventions    TR Interventions    SW/CM Interventions Discharge Planning, Psychosocial Support, Patient/Family Education   Barriers to Discharge MD  Medical stability  Nursing Home environment access/layout, Decreased caregiver support 2 level  2ste left rail, B+B up 12 steps, ambulates with Rollator in home and grocery store, hx of falling so family always walks with her. Pt has cognitive deficits at baseline but very motivated and reponds better to Errorless learning strategy for safety cues with transfers/gait.  PT Home environment access/layout, New oxygen    OT Home environment access/layout    SLP  (n/a: no SLP needs identified)    SW       Team Discharge Planning: Destination: PT-Home ,OT- Home , SLP-Home Projected Follow-up: PT-Outpatient PT, OT-  Home health OT, 24 hour supervision/assistance, SLP-None Projected Equipment Needs: PT-Other (comment), OT- To be determined, SLP-None recommended by SLP Equipment Details: PT-pt owns necessary equipment, but family is wondering about a hospital bed., OT-  Patient/family involved in discharge planning: PT- Patient, Family Midwife,  OT-Patient, Family member/caregiver, Systems analyst, Patient  MD ELOS: 10-14 Medical Rehab Prognosis:  Excellent Assessment: The patient has been admitted for CIR therapies with the diagnosis of acute respiratory failure exacerbation of underlying subacute or  chronic lung disease . The team will be addressing functional mobility, strength, stamina, balance, safety, adaptive techniques and equipment, self-care, bowel and bladder mgt, patient and caregiver education. Goals have been set at supervision. Anticipated discharge destination is home.        See Team Conference Notes for weekly updates to the plan of care

## 2023-01-17 NOTE — Evaluation (Signed)
Speech Language Pathology Assessment and Plan  Patient Details  Name: [name] MRN: [id number] Date of Birth: 23-Jan-1941  SLP Diagnosis:  r/o oropharyngeal dysphagia; concerns for esophageal dysphagia Rehab Potential:  (n/a: no SLP needs identified) ELOS: d/c SLP    Today's Date: 01/17/2023 SLP Individual Time: 1100-1140 SLP Individual Time Calculation (min): 40 min   Hospital Problem: Principal Problem:   Acute respiratory failure  Past Medical History:  Past Medical History:  Diagnosis Date   Atrial fibrillation ([place])    Chronic back pain    "mid back down into lower back" (08/25/2014)   Diabetes mellitus without complication (HCC)    Frequent falls    GERD (gastroesophageal reflux disease)    Hypertriglyceridemia    OSA on CPAP    Osteoarthritis    "knees, hands, back" (08/25/2014)   Pneumonia ~ 2000 X 1   Subdural hematoma ([place]) july 2015   S/P fall while on Coumadin   T12 compression fracture ([place]) 2012   Type II diabetes mellitus ([place])    Past Surgical History:  Past Surgical History:  Procedure Laterality Date   APPENDECTOMY  2012   CATARACT EXTRACTION W/ INTRAOCULAR LENS  IMPLANT, BILATERAL Bilateral 2000's   IR GENERIC HISTORICAL  10/15/2016   IR PERCUTANEOUS ART THROMBECTOMY/INFUSION INTRACRANIAL INC DIAG ANGIO 10/15/2016 [name], MD [facility]   IR GENERIC HISTORICAL  12/13/2016   IR RADIOLOGIST EVAL & MGMT 12/13/2016 [facility]   RADIOLOGY WITH ANESTHESIA N/A 10/15/2016   Procedure: RADIOLOGY WITH ANESTHESIA;  Surgeon: [name], MD;  Location: [place];  Service: Radiology;  Laterality: N/A;   TOTAL ABDOMINAL HYSTERECTOMY  1990    Assessment / Plan / Recommendation Clinical Impression  HPI: [name] is an 81-year right-handed [place] speaking female with history of diabetes mellitus, atrial fibrillation currently maintained on Eliquis, chronic back pain, hyperlipidemia subdural hematoma after fall 2015 while on Coumadin.  Per chart  review patient lives with her children.  Two-level home bed and bath upstairs.  Ambulates with a rollator in the home.  Independent with ADLs.  Presented 01/04/2023 with increasing difficulties breathing and a cough as well as fever of 103.  Chest x-ray with no acute process.  CT angiogram of the chest no pulmonary emboli identified.  Admission chemistries unremarkable except glucose 152, coronavirus negative, WBC 14,100, hemoglobin 9.8, blood cultures no growth to date, lactic acid 2.6.  Placed on broad-spectrum antibiotics initiated and completed as well as prednisone taper that she will remain on through 01/22/2023.  Echocardiogram with ejection fraction of 60 to 65% no wall motion abnormalities.  Still using nasal cannula oxygen to maintain saturations.  Tolerating a mechanical soft diet.  Therapy evaluations completed due to patient's debility decreased functional mobility was admitted for a comprehensive rehab program. Daughter says she uses Purewick at home at night.    Skilled Therapeutic Interventions          Pt seen for clinical swallow evaluation. Pt has recently undergone swallow work up with completion of MBSS on 01/05/23 and completion of Esophagram on 01/10/23. Please see reports in electronic medical record for details.   Pt's daughter present to provide hx and assist with interpreting. [place] interpreter was present, though pt's daughter declined formal interpreting services for today and expressed that she is able to interpret as needed for her mother.   Daughter reported that pt has a long hx of globus sensation with solids and solid food dysphagia. Pt avoids dry and crunchy solids at baseline and  uses liquids or oatmeal to "push solids down" when globus sensation occurs. Pt's daughter denied hx of EGD or esophageal dilation. Pt with a hx of GERd and is now taking Protonix.   Today, pt presents with a grossly functional oropharyngeal swallow function; consistent with results of recent  MBSS. Oral prep and transit prompt with complete oral clearance, despite pt's lack of dentition. Pharyngeal swallow initiation appeared prompt with laryngeal elevation noted. Observed delayed, dry cough x1 instance that did not appear related to PO intake. Pt reported cough was a dry cough and denied globus sensation or sensation of airway violation at this time. No other overt or subtle s/s of aspiration were observed. Vocal quality remained clear throughout assessment.   Recommend continue Dys 3 diet and thin liquids at this time. Recommend pills whole in applesauce or yogurt given report of pill dysphagia when meds are given with liquids.   Recommend GI consult for EGD and esophageal dilation if indicated.   SLP reviewed esophageal precautions in effort to aid esophageal motility and to reduce risk of postprandial aspiration. Good understanding verbalized by daughter and pt. No further SLP needs identified at this time. SLP will sign off.    SLP Assessment  Patient does not need any further Speech Lanaguage Pathology Services    Recommendations  Recommended Consults: Consider GI evaluation;Consider esophageal assessment (Consider EGD per GI recommendation) SLP Diet Recommendations: Dysphagia 3 (Mech soft);Thin Liquid Administration via: Cup;Straw Medication Administration: Whole meds with puree Supervision: Patient able to self feed Compensations: Small sips/bites;Slow rate Postural Changes and/or Swallow Maneuvers: Seated upright 90 degrees;Upright 30-60 min after meal Oral Care Recommendations: Oral care BID Patient destination: Home Follow up Recommendations: None Equipment Recommended: None recommended by SLP    SLP Frequency   N/a  SLP Duration  SLP Intensity  SLP Treatment/Interventions d/c SLP   N/a    N/a   Pain Pain Assessment Pain Scale: 0-10 Pain Score: 0-No pain  Prior Functioning Type of Home: House  Lives With: Daughter;Family Available Help at Discharge:  [place] to hear (with hearing aid or hearing appliances if normally used Ability to hear (with hearing aid or hearing appliances if normally used): 1. Minimal difficulty - difficulty in some environments (e.g. when person speaks softly or setting is noisy)   Expression of Ideas and Wants Expression of Ideas and Wants: 4. Without difficulty (complex and basic) - expresses complex messages without difficulty and with speech that is clear and easy to understand   Understanding Verbal and Non-Verbal Content Understanding Verbal and Non-Verbal Content: 3. Usually understands - understands most conversations, but misses some part/intent of message. Requires cues at times to understand  Memory/Recall Ability Memory/Recall Ability : Current season;That he or she is in a hospital/hospital unit   Bedside Swallowing Assessment General Date of Onset: 01/13/17 Previous Swallow Assessment: MBS 01/05/23: Dys 3 diet and thin liquids recommended Diet Prior to this Study: Dysphagia 3 (mechanical soft);Thin liquids (Level 0) Temperature Spikes Noted: No Respiratory Status: Room air History of Recent Intubation: No Behavior/Cognition: Alert;Cooperative;Pleasant mood Oral Cavity - Dentition: Dentures, not available;Missing dentition Self-Feeding Abilities: Able to feed self Vision: Functional for self-feeding Patient Positioning: Other (comment) (upright at EOB) Baseline Vocal Quality: Normal Volitional Cough: Strong Volitional Swallow: Able to elicit  Oral Care Assessment Oral Assessment  (WDL): Exceptions to WDL Lips: Symmetrical Teeth: Missing (Comment) Tongue: Pink;Moist Mucous Membrane(s): Moist;Pink Saliva: Moist, saliva free flowing Level of Consciousness: Alert Is patient on  any of following O2 devices?: None of the above Nutritional status: No high risk factors Oral Assessment Risk : Low Risk Ice Chips Ice chips: Not tested Thin Liquid Thin Liquid:  Within functional limits Presentation: Cup  Puree Puree: Within functional limits Presentation: Spoon;Self Fed Solid Solid: Within functional limits Presentation: Self Fed BSE Assessment Suspected Esophageal Findings Suspected Esophageal Findings: Globus sensation Risk for Aspiration Impact on safety and function: Mild aspiration risk (risk of postprandial aspiration due to suspected esophageal dysphagia) Other Related Risk Factors: History of GERD;Deconditioning;History of esophageal-related issues   Recommendations for other services: GI consult  Discharge Criteria: Patient will be discharged from SLP if patient refuses treatment 3 consecutive times without medical reason, if treatment goals not met, if there is a change in medical status, if patient makes no progress towards goals or if patient is discharged from hospital.  The above assessment, treatment plan, treatment alternatives and goals were discussed and mutually agreed upon: by patient and by family  Wyn Forster 01/17/2023, 12:09 PM

## 2023-01-18 ENCOUNTER — Inpatient Hospital Stay (HOSPITAL_COMMUNITY): Payer: Medicare Other

## 2023-01-18 DIAGNOSIS — E119 Type 2 diabetes mellitus without complications: Secondary | ICD-10-CM | POA: Diagnosis not present

## 2023-01-18 DIAGNOSIS — J9600 Acute respiratory failure, unspecified whether with hypoxia or hypercapnia: Secondary | ICD-10-CM | POA: Diagnosis not present

## 2023-01-18 DIAGNOSIS — I951 Orthostatic hypotension: Secondary | ICD-10-CM | POA: Diagnosis not present

## 2023-01-18 DIAGNOSIS — R1310 Dysphagia, unspecified: Secondary | ICD-10-CM | POA: Diagnosis not present

## 2023-01-18 DIAGNOSIS — R059 Cough, unspecified: Secondary | ICD-10-CM

## 2023-01-18 LAB — GLUCOSE, CAPILLARY
Glucose-Capillary: 117 mg/dL — ABNORMAL HIGH (ref 70–99)
Glucose-Capillary: 138 mg/dL — ABNORMAL HIGH (ref 70–99)
Glucose-Capillary: 213 mg/dL — ABNORMAL HIGH (ref 70–99)
Glucose-Capillary: 137 mg/dL — ABNORMAL HIGH (ref 70–99)

## 2023-01-18 MED ORDER — ACETAMINOPHEN 325 MG PO TABS: 650.0000 mg | ORAL_TABLET | Freq: Four times a day (QID) | ORAL | Status: AC | PRN

## 2023-01-18 NOTE — Progress Notes (Signed)
Patient ID: [name], female   DOB: [birth year], 82 y.o.   MRN: [id number]  Met with pt and interpreter to give team conference update regarding goals of CGA-supervision level and discharge 4/6. Also called daughter-[name] to give her the update she wants to make sure medically stable to go home. Asked about O2 and it has been weaned off she mentioned with the steroids being weaned worried about respiratory issues. Discussed with have [facility] check this and also have PT and OT see her. She prefers [facility] referral made to them to provide home health and Ashley-liaison for [facility] has accepted the referral. She still wants Mom to have a hospital bed and have made referral to [facility] for this they will reach out to daughter regarding the delivery. Work toward discharge Sat.

## 2023-01-18 NOTE — Discharge Summary (Signed)
Physician Discharge Summary  Patient ID: [name] MRN: [id number] DOB/AGE: [birth year] 82 y.o.  Admit date: 01/14/2023 Discharge date: 01/21/2023  Discharge Diagnoses:  Principal Problem:   Acute respiratory failure DVT prophylaxis Atrial fibrillation Hyperlipidemia Diabetes mellitus Obesity  Discharged Condition: Stable  Significant Diagnostic Studies: DG Chest 2 View  Result Date: 01/18/2023 CLINICAL DATA:  Cough. EXAM: CHEST - 2 VIEW COMPARISON:  CT 01/09/2023. Radiographs 01/09/2023, 01/08/2023 and 01/04/2023. FINDINGS: 1557 hours. The heart size and mediastinal contours are stable. Previously demonstrated diffuse bilateral airspace opacities have partially cleared in the interval. No progressive airspace disease, significant pleural effusion or pneumothorax identified. Grossly stable T12 compression deformity and thoracolumbar scoliosis. IMPRESSION: Interval partial clearing of diffuse bilateral airspace opacities. No new findings. Electronically Signed   By: Carey Bullocks M.D.   On: 01/18/2023 16:10   ECHOCARDIOGRAM LIMITED  Result Date: 01/12/2023    ECHOCARDIOGRAM LIMITED REPORT   Patient Name:   [name]   Date of Exam: 01/12/2023 Medical Rec #:  [id number]  Height:       56.0 in Accession #:    [id number] Weight:       134.5 lb Date of Birth:  May 12, 1941  BSA:          1.500 m Patient Age:    82 years   BP:           137/77 mmHg Patient Gender: F          HR:           89 bpm. Exam Location:  Inpatient Procedure: Limited Echo, Cardiac Doppler and Color Doppler Indications:    Dyspnea R06.00  History:        Patient has prior history of Echocardiogram examinations, most                 recent 10/14/2022. Arrythmias:Atrial Fibrillation,                 Signs/Symptoms:Dyspnea; Risk Factors:Diabetes, Non-Smoker and                 Dyslipidemia.  Sonographer:    Aron Baba Referring Phys: [id number] ERIK C HOFFMAN  Sonographer Comments: Image acquisition challenging due to patient body habitus  and Image acquisition challenging due to respiratory motion. IMPRESSIONS  1. Left ventricular ejection fraction, by estimation, is 60 to 65%. The left ventricle has normal function. The left ventricle has no regional wall motion abnormalities. Left ventricular diastolic function could not be evaluated.  2. Right ventricular systolic function is normal. The right ventricular size is normal. There is moderately elevated pulmonary artery systolic pressure.  3. The mitral valve is degenerative. Mild to moderate mitral valve regurgitation. No evidence of mitral stenosis. Moderate mitral annular calcification.  4. Tricuspid valve regurgitation is mild to moderate.  5. The aortic valve is normal in structure. Aortic valve regurgitation is mild to moderate. No aortic stenosis is present.  6. The inferior vena cava is normal in size with <50% respiratory variability, suggesting right atrial pressure of 8 mmHg. FINDINGS  Left Ventricle: Left ventricular ejection fraction, by estimation, is 60 to 65%. The left ventricle has normal function. The left ventricle has no regional wall motion abnormalities. The left ventricular internal cavity size was normal in size. There is  no left ventricular hypertrophy. Left ventricular diastolic function could not be evaluated. Left ventricular diastolic function could not be evaluated due to atrial fibrillation. Right Ventricle: The right ventricular size is normal. No increase in right ventricular  wall thickness. Right ventricular systolic function is normal. There is moderately elevated pulmonary artery systolic pressure. The tricuspid regurgitant velocity is 3.11 m/s, and with an assumed right atrial pressure of 8 mmHg, the estimated right ventricular systolic pressure is 46.7 mmHg. Left Atrium: Left atrial size was normal in size. Right Atrium: Right atrial size was normal in size. Pericardium: There is no evidence of pericardial effusion. Mitral Valve: The mitral valve is degenerative  in appearance. There is mild thickening of the mitral valve leaflet(s). There is mild calcification of the mitral valve leaflet(s). Moderate mitral annular calcification. Mild to moderate mitral valve regurgitation, with eccentric anteriorly directed jet. No evidence of mitral valve stenosis. Tricuspid Valve: The tricuspid valve is normal in structure. Tricuspid valve regurgitation is mild to moderate. No evidence of tricuspid stenosis. Aortic Valve: The aortic valve is normal in structure. Aortic valve regurgitation is mild to moderate. Aortic regurgitation PHT measures 443 msec. No aortic stenosis is present. Pulmonic Valve: The pulmonic valve was normal in structure. Pulmonic valve regurgitation is not visualized. No evidence of pulmonic stenosis. Aorta: The aortic root is normal in size and structure. Venous: The inferior vena cava is normal in size with less than 50% respiratory variability, suggesting right atrial pressure of 8 mmHg. IAS/Shunts: No atrial level shunt detected by color flow Doppler. Additional Comments: Spectral Doppler performed. Color Doppler performed.  LEFT VENTRICLE PLAX 2D LVIDd:         3.90 cm LVIDs:         2.70 cm LV PW:         1.10 cm LV IVS:        0.80 cm  LEFT ATRIUM         Index LA diam:    5.60 cm 3.73 cm/m  AORTIC VALVE AI PHT:      443 msec MITRAL VALVE                TRICUSPID VALVE MV Area (PHT): 3.48 cm     TR Peak grad:   38.7 mmHg MV Decel Time: 218 msec     TR Vmax:        311.00 cm/s MR Peak grad: 64.0 mmHg MR Vmax:      400.00 cm/s MV E velocity: 125.00 cm/s Armanda Magic MD Electronically signed by Armanda Magic MD Signature Date/Time: 01/12/2023/3:58:34 PM    Final    DG ESOPHAGUS W SINGLE CM (SOL OR THIN BA)  Result Date: 01/10/2023 CLINICAL DATA:  Dysphagia. Poor p.o. intake. Request for barium esophagram for evaluation. EXAM: ESOPHAGUS/BARIUM SWALLOW/TABLET STUDY TECHNIQUE: Limited single contrast examination was performed using thin liquid barium. This exam  was performed by Brayton El PA-C , and was supervised and interpreted by Dr. Carey Bullocks. FLUOROSCOPY: Radiation Exposure Index (as provided by the fluoroscopic device): 51.3 mGy Kerma COMPARISON:  Chest CT 01/09/2023 and chest CTA 01/04/2023. FINDINGS: Swallowing: Appears normal. No vestibular penetration or aspiration seen. Pharynx: Unremarkable. Esophagus: No mucosal lesions or strictures. 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 Esophageal motility: Evidence of mild, age-related esophageal dysmotility. Hiatal Hernia: None seen, but patient unable to be evaluated in the prone position. Gastroesophageal reflux: None visualized. Ingested 13 mm barium tablet: Briefly delayed at the level of the distal esophagus but passed after a few minutes and additional swallows of thin barium. IMPRESSION: No esophageal lesions or strictures. 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 Electronically Signed   By: Carey Bullocks M.D.   On: 01/10/2023 15:56   CT CHEST WO CONTRAST  Result Date: 01/09/2023 CLINICAL DATA:  Multifocal pneumonia EXAM: CT CHEST WITHOUT CONTRAST TECHNIQUE: Multidetector CT imaging of the chest was performed following the standard protocol without IV contrast. RADIATION DOSE REDUCTION: This exam was performed according to the departmental dose-optimization program which includes automated exposure control, adjustment of the mA and/or kV according to patient size and/or use of iterative reconstruction technique. COMPARISON:  Chest radiograph 01/09/2023.  CT chest 01/04/2023 FINDINGS: Cardiovascular: Mild cardiac enlargement. No pericardial effusions. Normal caliber thoracic aorta. Calcification in the aorta and coronary arteries. Calcification of the mitral and aortic valves.  Mediastinum/Nodes: Esophagus is decompressed. Thyroid gland is unremarkable. Prominent lymph nodes in the mediastinum. Largest anterior mediastinal node measures 1.4 cm short axis dimension. No change since prior study. Lungs/Pleura: Small bilateral pleural effusions, greater on the right, and increasing since prior study. Mosaic attenuation pattern to the lungs with multiple focal areas of airspace and ground-glass infiltration and focal areas of consolidation. Changes are likely to represent multifocal pneumonia and appear to be progressing since the previous CT. No pneumothorax. 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 Left adrenal gland nodule, 1.2 cm diameter, -28 Hounsfield units density. No change since prior study. Appearance is consistent with a benign adenoma no imaging follow-up is indicated. Musculoskeletal: Degenerative changes in the spine. Old compression deformity of T11 and T12. IMPRESSION: 1. Focal areas of infiltration and consolidation throughout both lungs consistent with multifocal pneumonia and progressing since prior study. 2. Small bilateral pleural effusions, greater on the right, developing since prior study. 3. Cardiac enlargement. 4. Additional incidental findings are unchanged since prior study and include hepatic cirrhosis, aortic atherosclerosis, and old thoracic compression deformities. 5. Benign-appearing left adrenal gland nodule is unchanged since prior study. No imaging follow-up is indicated. Electronically Signed   By: Burman Nieves M.D.   On: 01/09/2023 19:36   DG CHEST PORT 1 VIEW  Result Date: 01/09/2023 CLINICAL DATA:  Pneumonia EXAM: PORTABLE CHEST 1 VIEW COMPARISON:  Previous studies including the examination of 01/08/2023 FINDINGS: There is poor inspiration. There are patchy interstitial and alveolar infiltrates in both lungs, more so on the right side. There is slight interval improvement in the aeration in right mid and right lower lung fields. No new  infiltrates are seen. Lateral CP angles are indistinct. There is no pneumothorax. IMPRESSION: Infiltrates are seen in both lungs, more so on the right side suggesting asymmetric pulmonary edema or multifocal pneumonia. There is improvement in aeration in right mid and right lower lung fields. Electronically Signed   By: Ernie Avena M.D.   On: 01/09/2023 15:12   DG CHEST PORT 1 VIEW  Result Date: 01/08/2023 CLINICAL DATA:  10028 Wheezing 16109 EXAM: PORTABLE CHEST - 1 VIEW COMPARISON:  01/04/2023 FINDINGS: Low lung volumes. Worsening airspace disease in the right upper lobe, right infrahilar region and lower lobe, and left perihilar. Heart size and mediastinal contours are within normal limits. Aortic Atherosclerosis (ICD10-170.0). Blunting of costophrenic angles. Visualized bones unremarkable. IMPRESSION: Worsening asymmetric airspace disease, right worse than left. Electronically Signed   By: Corlis Leak M.D.   On: 01/08/2023 10:31   DG Swallowing Func-Speech Pathology  Result Date: 01/05/2023 Table formatting from the original result was not included. Modified Barium Swallow Study Patient Details Name: Ariatna Jester MRN: [id number] Date of Birth: September 14, 1941 Today's Date: 01/05/2023 HPI/PMH: HPI: Pt is  an 82 year old Vietnamese-speaking female who presented secondary to breathing difficulties. Pt reportedly had RSV 2 weeks prior to admission and had been improving in the outpatient setting until she started having worsening dyspnea on exertion. CT chest 3/20: Interval development of scattered bilateral airspace opacities, most conspicuous within the right middle lobe, nonspecific though worrisome for multifocal infection. Pt was scheduled to have an outpatient modified barium swallow study 3/21. PMH: GERD, Subdural hematoma, PNA, DM, frequent falls, Afib. MBS 01/13/17: oropharyngeal swallow is within gross functional limits given age. A regular texture diet with thin liquids recommended at that time with  request to consider esophageal assessment to rule out risk for post-prandial aspiration. Clinical Impression: Clinical Impression: [employer] Vietenamese interpreter, Truddie Coco (ID# [id number]), was used for translation. Pt presents with pharyngeal impairments including reduced tongue base retraction, reduced anterior laryngeal movement, and a pharyngeal delay. The swallow was often triggered with the head of the liquid bolus at the level of the hypopharynx. This resulted in intermittent transient penetration (PAS 2) with larger boluses (e.g., 2oz) of thin liquids, but this is considered WNL. Coughing was noted intermittently, but this did not align with any instances of penetration and aspiration was not observed during the study. When compared with video recording of the pt's MBS in 2018, pt's overall swallow mechanism appears comparable and is still judged to be [facility]. Considering pt's history of GERD, her reported infrequent use of medication, and the daughter's report of increased coughing after intake of acidic or spicy foods, SLP questions the potential for post-prandial aspiration and recommends that esophageal assessment (e.g., esophagram) be considered. A dysphagia 3 diet with thin liquids is recommended at this time since pt's daughter reported that pt is more symptomatic for very dry foods; SLP will follow briefly. Factors that may increase risk of adverse event in presence of aspiration Rubye Oaks & Clearance Coots 2021): Factors that may increase risk of adverse event in presence of aspiration Rubye Oaks & Clearance Coots 2021): Respiratory or GI disease Recommendations/Plan: Swallowing Evaluation Recommendations Swallowing Evaluation Recommendations Recommendations: PO diet PO Diet Recommendation: Dysphagia 3 (Mechanical soft); Thin liquids (Level 0) Liquid Administration via: Cup; Straw Medication Administration: Whole meds with liquid (or with puree; per pt preference) Supervision: Staff to assist with self-feeding  Swallowing strategies  : Slow rate; Small bites/sips Postural changes: Stay upright 30-60 min after meals; Position pt fully upright for meals Oral care recommendations: Oral care BID (2x/day) Recommended consults: Consider esophageal assessment Treatment Plan Treatment Plan Treatment recommendations: Therapy as outlined in treatment plan below Follow-up recommendations: -- (pt may continue HH SLP services for education and further assessment in a more ecologically valid setting) Functional status assessment: Patient has not had a recent decline in their functional status. Treatment frequency: Min 1x/week Treatment duration: 1 week Interventions: Diet toleration management by SLP; Patient/family education; Compensatory techniques Recommendations Recommendations for follow up therapy are one component of a multi-disciplinary discharge planning process, led by the attending physician.  Recommendations may be updated based on patient status, additional functional criteria and insurance authorization. Assessment: Orofacial Exam: Orofacial Exam Oral Cavity: Oral Hygiene: WFL Oral Cavity - Dentition: Dentures, top; Dentures, bottom; Missing dentition (four natural mandibular incisors) Orofacial Anatomy: WFL Anatomy: Anatomy: WFL Thin Liquids: Thin Liquids (Level 0) Thin Liquids : Impaired Bolus delivery method: Cup Thin Liquid - Impairment: Pharyngeal impairment Initiation of swallow : Posterior laryngeal surface of the epiglottis Soft palate elevation: No bolus between soft palate (SP)/pharyngeal wall (PW) Laryngeal elevation: Complete superior movement of thyroid cartilage with complete  approximation of arytenoids to epiglottic petiole Anterior hyoid excursion: Partial Epiglottic movement: Complete Laryngeal vestibule closure: Complete, no air/contrast in laryngeal vestibule Pharyngeal stripping wave : Present - diminished Pharyngeal contraction (A/P view only): N/A Pharyngoesophageal segment opening: Complete  distension and complete duration, no obstruction of flow Tongue base retraction: Narrow column of contrast or air between tongue base and PPW Pharyngeal residue: Trace residue within or on pharyngeal structures Location of pharyngeal residue: Valleculae; Tongue base  Mildly Thick Liquids: No data recorded Moderately Thick Liquids: No data recorded Puree: Puree Puree: Impaired Puree - Impairment: Pharyngeal impairment Initiation of swallow: Posterior laryngeal surface of the epiglottis Soft palate elevation: No bolus between soft palate (SP)/pharyngeal wall (PW) Laryngeal elevation: Complete superior movement of thyroid cartilage with complete approximation of arytenoids to epiglottic petiole Anterior hyoid excursion: Partial Epiglottic movement: Complete Laryngeal vestibule closure: Complete, no air/contrast in laryngeal vestibule Pharyngeal stripping wave : Present - diminished Pharyngeal contraction (A/P view only): N/A Pharyngoesophageal segment opening: Complete distension and complete duration, no obstruction of flow Tongue base retraction: Narrow column of contrast or air between tongue base and PPW Pharyngeal residue: Trace residue within or on pharyngeal structures Location of pharyngeal residue: Valleculae Solid: Solid Solid: -- (similar to that noted with puree) Pill: Pill Pill: -- (pt requested that it be broken in half) Compensatory Strategies: No data recorded  General Information: Caregiver present: No  Diet Prior to this Study: NPO   Temperature : Normal   Respiratory Status: WFL   Supplemental O2: Nasal cannula   History of Recent Intubation: No  Behavior/Cognition: Alert; Cooperative; Pleasant mood Self-Feeding Abilities: Able to self-feed Baseline vocal quality/speech: Normal No data recorded Volitional Swallow: Able to elicit Exam Limitations: Poor participation (encouragement needed) Goal Planning: Prognosis for improved oropharyngeal function: Good No data recorded No data recorded  Patient/Family Stated Goal: none stated No data recorded Pain: Pain Assessment Pain Assessment: No/denies pain (rubs R knee after walking, denies it hurts) Faces Pain Scale: 2 Pain Location: chest Pain Descriptors / Indicators: Tightness Pain Intervention(s): Monitored during session End of Session: Start Time:SLP Start Time (ACUTE ONLY): 1345 Stop Time: SLP Stop Time (ACUTE ONLY): 1420 Time Calculation:SLP Time Calculation (min) (ACUTE ONLY): 35 min Charges: SLP Evaluations $ SLP Speech Visit: 1 Visit SLP Evaluations $BSS Swallow: 1 Procedure $MBS Swallow: 1 Procedure SLP visit diagnosis: SLP Visit Diagnosis: Dysphagia, unspecified (R13.10) Past Medical History: Past Medical History: Diagnosis Date  Atrial fibrillation (HCC)   Chronic back pain   "mid back down into lower back" (08/25/2014)  Diabetes mellitus without complication (HCC)   Frequent falls   GERD (gastroesophageal reflux disease)   Hypertriglyceridemia   OSA on CPAP   Osteoarthritis   "knees, hands, back" (08/25/2014)  Pneumonia ~ 2000 X 1  Subdural hematoma (HCC) july 2015  S/P fall while on Coumadin  T12 compression fracture (HCC) 2012  Type II diabetes mellitus (HCC)  Past Surgical History: Past Surgical History: Procedure Laterality Date  APPENDECTOMY  2012  CATARACT EXTRACTION W/ INTRAOCULAR LENS  IMPLANT, BILATERAL Bilateral 2000's  IR GENERIC HISTORICAL  10/15/2016  IR PERCUTANEOUS ART THROMBECTOMY/INFUSION INTRACRANIAL INC DIAG ANGIO 10/15/2016 [name], MD [facility]  IR GENERIC HISTORICAL  12/13/2016  IR RADIOLOGIST EVAL & MGMT 12/13/2016 [facility]  RADIOLOGY WITH ANESTHESIA N/A 10/15/2016  Procedure: RADIOLOGY WITH ANESTHESIA;  Surgeon: [name], MD;  Location: [facility] OR;  Service: Radiology;  Laterality: N/A;  TOTAL ABDOMINAL HYSTERECTOMY  1990 Shanika I. Vear Clock, MS, CCC-SLP Acute Rehabilitation Services Office number [phone]  Scheryl Marten 01/05/2023, 4:16 PM  CT Angio Chest Pulmonary Embolism (PE) W or WO  Contrast  Result Date: 01/04/2023 CLINICAL DATA:  Concern for pulmonary embolism. EXAM: CT ANGIOGRAPHY CHEST WITH CONTRAST TECHNIQUE: Multidetector CT imaging of the chest was performed using the standard protocol during bolus administration of intravenous contrast. Multiplanar CT image reconstructions and MIPs were obtained to evaluate the vascular anatomy. RADIATION DOSE REDUCTION: This exam was performed according to the departmental dose-optimization program which includes automated exposure control, adjustment of the mA and/or kV according to patient size and/or use of iterative reconstruction technique. CONTRAST:  53 mL OMNIPAQUE IOHEXOL 350 MG/ML SOLN COMPARISON:  12/04/2022; chest radiograph-earlier same day; 12/10/2022; CT abdomen pelvis-09/03/2017; 01/18/2008 FINDINGS: Vascular Findings: There is adequate opacification of the pulmonary arterial system with the main pulmonary artery measuring 350 Hounsfield units. There are no discrete filling defects within the pulmonary arterial tree to suggest pulmonary embolism. Enlarged caliber of the main pulmonary artery measuring 40 mm in diameter. Cardiomegaly. Calcifications involving the mitral valve annulus. Calcifications involving the aortic leaflets (image 61). Scattered atherosclerotic plaque within a normal caliber thoracic aorta. 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 The descending thoracic aorta is tortuous but of normal caliber. Review of the MIP images confirms the above findings. 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 Scattered mediastinal lymph nodes are numerous and mildly prominent though individually not enlarged by size criteria with  index retrotracheal lymph node measuring 0.9 cm in diameter (image 20, series 5, index precarinal lymph node measuring 0.8 cm (image 35, series 5, presumably reactive in etiology. No definitive hilar or axillary lymphadenopathy. Lungs/Pleura: Interval development of consolidative airspace opacities with associated air bronchograms within the right middle lobe (representative image 61, series 7). Ill-defined ground-glass opacities are seen involving the right lung apex (image 31, series 7). Ill-defined heterogeneous airspace opacities are also seen within the medial basilar aspect of the bilateral lower lobes, right slightly greater than left. There is mild diffuse bronchial wall thickening however the central pulmonary airways appear patent. No pleural effusion or pneumothorax. Upper abdomen: Limited early arterial phase evaluation of the upper abdomen demonstrates nodularity of the hepatic contour suggestive of cirrhosis. Note is made of an approximately 1.2 cm left-sided adrenal nodule (image 85, series 5, with a morphologically unchanged since remote abdominal CT performed in 2009 and thus compatible with a benign adrenal adenoma. Punctate splenule is noted at the level of the splenic hilum. Musculoskeletal: Interval development of diffuse mixed lytic appearance of the imaged osseous structures, most conspicuously involving the sternum and manubrium. Interval development of a nondisplaced fracture involving the right-side of the manubrium (sagittal image 86, series 9; axial image 30, series 5)), new compared to recent chest CT performed 12/04/2022. Old severe (greater 75%) compression deformity of the T12 vertebral body, similar since at least the 2018 abdominal CT. Regional soft tissues appear normal. Normal appearance of the thyroid gland. IMPRESSION: 1. No evidence of pulmonary embolism. 2. Interval development of scattered bilateral airspace opacities, most conspicuous within the right middle lobe,  nonspecific though worrisome for multifocal infection, including atypical etiologies. 3. Interval development of diffuse mixed lytic appearance of the imaged osseous structures, most conspicuously involving the sternum and manubrium, with associated nondisplaced fracture of the right-side of the manubrium, new compared to recent chest CT performed 12/04/2022. Findings are  nonspecific though could be seen in the setting of multiple myeloma. Clinical correlation is advised. Further evaluation with SPEP/UPEP and skeletal survey could be performed as indicated. 4. Cardiomegaly with calcifications involving the aortic leaflets and enlargement of the caliber of the main pulmonary artery, nonspecific though could be seen in the setting of pulmonary arterial hypertension. Further evaluation with cardiac echo could be performed as indicated. 5. Incidentally noted aberrant right subclavian artery. 6. Nodularity of the hepatic contour suggestive of cirrhosis. Correlation with LFTs is advised. 7. Aortic Atherosclerosis (ICD10-I70.0). Electronically Signed   By: Simonne Come M.D.   On: 01/04/2023 12:51   DG Chest Portable 1 View  Result Date: 01/04/2023 CLINICAL DATA:  82 year old female with cough and shortness of breath. Issues with swallowing, outpatient swallow study planned for tomorrow. EXAM: PORTABLE CHEST 1 VIEW COMPARISON:  Portable chest 12/10/2022 and earlier. FINDINGS: Portable AP semi upright view at 0930 hours. Stable lung volumes and mediastinal contours. Mild cardiomegaly and tortuous thoracic aorta. Visualized tracheal air column is within normal limits. Allowing for portable technique ventilation is stable since last month with no pneumothorax, pleural effusion, edema, or confluent opacity. Negative visible bowel gas pattern. No acute osseous abnormality identified. IMPRESSION: No acute cardiopulmonary abnormality. Electronically Signed   By: Odessa Fleming M.D.   On: 01/04/2023 09:44    Labs:  Basic Metabolic  Panel: Recent Labs  Lab 01/15/23 0607 01/16/23 0514  NA 140 141  K 3.8 4.1  CL 99 102  CO2 30 27  GLUCOSE 104* 125*  BUN 11 13  CREATININE 0.78 0.88  CALCIUM 9.0 8.9  MG 2.2  --     CBC: Recent Labs  Lab 01/15/23 0607 01/16/23 0514  WBC 9.5 10.0  NEUTROABS 7.1 7.3  HGB 10.1* 10.2*  HCT 30.6* 31.9*  MCV 104.1* 107.0*  PLT 468* 479*    CBG: Recent Labs  Lab 01/18/23 2116 01/19/23 0649 01/19/23 1158 01/19/23 1658 01/19/23 2012  GLUCAP 137* 103* 118* 211* 172*   Family history negative for diabetes hypertension colon cancer or rectal cancer  Brief HPI:   Lounell Schumacher is a 82 y.o. right-handed [place] female with history of diabetes mellitus atrial fibrillation maintained on Eliquis chronic back pain hyperlipidemia subdural hematoma after fall 2015 while on Coumadin.  Per chart review lives with her family.  Ambulates with a rollator.  Presented 01/04/2023 with increasing difficulties breathing and a cough as well as a fever of 103.  Chest x-ray no active process.  CT angiogram negative for pulmonary emboli.  Admission chemistries unremarkable except glucose 152 coronavirus negative WBC 14,100 hemoglobin 9.8 blood cultures no growth to date lactic acid 2.6.  She was placed on broad-spectrum antibiotics and prednisone taper.  Echocardiogram with ejection fraction of 60 to 65% no wall motion abnormalities.  She was weaned from oxygen therapy.  Patient with chronic back pain using Lidoderm patch with good results.  Therapy evaluations completed due to patient decreased functional mobility was admitted for a comprehensive rehab program.   Hospital Course: [facility] was admitted to rehab 01/14/2023 for inpatient therapies to consist of PT, ST and OT at least three hours five days a week. Past admission physiatrist, therapy team and rehab RN have worked together to provide customized collaborative inpatient rehab.  Pertaining to patient's debility related to acute respiratory failure  exacerbation of underlying subacute or chronic lung disease remained stable tapered from prednisone therapy.  She was weaned from oxygen therapy.  Antibiotic therapy completed.  She remained  on chronic Eliquis for history of atrial fibrillation cardiac rate controlled no bleeding episodes.  Blood pressure monitor for any orthostasis she remained on beta-blocker.  Blood sugars overall controlled monitor while on prednisone therapy.  Noted obesity BMI 30.15 dietary follow-up.  Noted dysphagia on mechanical soft diet.  There was some question of esophageal dysphagia with outpatient referral made to gastroenterology.   Blood pressures were monitored on TID basis and soft and monitored  Diabetes has been monitored with ac/hs CBG checks and SSI was use prn for tighter BS control.    Rehab course: During patient's stay in rehab weekly team conferences were held to monitor patient's progress, set goals and discuss barriers to discharge. At admission, patient required min mod assist 25 feet rolling walker moderate assist step pivot transfers  Physical exam.  Blood pressure 144/82 pulse 79 temperature 97.3 respirations 18 oxygen saturation is 97% room air Constitutional.  No acute distress HEENT Head.  Normocephalic and atraumatic Eyes.  Pupils round and reactive to light no discharge without nystagmus Neck.  Supple nontender no JVD without thyromegaly Cardiac regular rate and rhythm without any extra sounds or murmur heard Abdomen.  Soft nontender positive bowel sounds without rebound Respiratory effort normal no respiratory distress without wheeze Neurologic.  Alert oriented non-English speaking.  Follows commands.  Strength 5/5  He/She  has had improvement in activity tolerance, balance, postural control as well as ability to compensate for deficits. He/She has had improvement in functional use RUE/LUE  and RLE/LLE as well as improvement in awareness.  Ambulates rolling walker contact-guard working  with energy conservation techniques.  Donned and doffed clothing to perform PeriCare with supervision.  Navigate stairs with contact-guard.  Full family teaching completed plan discharge to home       Disposition: Discharge to home    Diet: Mechanical soft     Special Instructions: No driving smoking or alcohol  Ambulatory referral obtained with gastroenterology for esophageal dysphagia  Medications at discharge 1.  Tylenol as needed 2.  Eliquis 2.5 mg p.o. twice daily 3.  Cymbalta 30 mg p.o. nightly 4.  Mucinex 600 mg p.o. twice daily 5.  Lidoderm patch changes directed 6.  Zyrtec daily as needed 7.  Lopressor 25 mg p.o. twice daily 8.  Protonix 40 mg p.o. daily 9.  Pravachol 20 mg p.o. nightly 10.  Prednisone 40 mg daily until 01/24/2023 then 30 mg daily x 7 days then 20 mg daily x 7 days then 10 mg daily x 7 days. 11.  Vitamin D 2000 units daily 12.  Forteo 600 mcg into the skin at bedtime 13.  Glucophage 500 mg p.o. twice daily 14.  Nitroglycerin as needed 15.  Vitamin K 100 mcg daily  30-35 minutes were spent completing discharge summary and discharge planning  Discharge Instructions     Ambulatory referral to Gastroenterology   Complete by: As directed    What is the reason for referral?: Other Comment - Esophageal dysphagia        Follow-up Information     Fanny Dance, MD Follow up.   Specialty: Physical Medicine and Rehabilitation Why: No formal follow-up needed Contact information: [address] [place] [address] [phone]         Omar Person, MD Follow up.   Specialty: Pulmonary Disease Why: Call for appointment Contact information: [address] [place] [address] [phone]         Lewayne Bunting, MD Follow up.  Specialty: Cardiology Why: Call for appointment Contact information: [address] [place] [address] [phone]                  Signed: Mcarthur Rossetti Tramar Brueckner 01/20/2023, 5:17 AM

## 2023-01-18 NOTE — Patient Care Conference (Cosign Needed)
Inpatient RehabilitationTeam Conference and Plan of Care Update Date: 01/18/2023   Time: 12:07 PM    Patient Name: [name]      Medical Record Number: [id number]  Date of Birth: [birth year] Sex: Female         Room/Bed: 4W26C/4W26C-01 Payor Info: Payor: MEDICARE / Plan: MEDICARE PART A AND B / Product Type: *No Product type* /    Admit Date/Time:  01/14/2023  3:02 PM  Primary Diagnosis:  Acute respiratory failure  Hospital Problems: Principal Problem:   Acute respiratory failure    Expected Discharge Date: Expected Discharge Date: 01/21/23  Team Members Present: Physician leading conference: Dr. Jennye Boroughs Social Worker Present: Ovidio Kin, LCSW Nurse Present: Dorien Chihuahua, RN PT Present: Canary Brim, PT;Other (comment) Rema Jasmine, PT) OT Present: Willeen Cass, OT     Current Status/Progress Goal Weekly Team Focus  Bowel/Bladder   Continent of b/b. Uses purewick at hs as baseline. (Uses purewick at home during HS.)   Remain continent   Assist with toileting as needed.    Swallow/Nutrition/ Hydration               ADL's   (S) UB ADLs, min A LB and toileting tasks. CGA with the RW. Requires cueing for RW management and has poor righting reactions. Very supportive daughter who was already set up with bed alarms at home. Discharge 4/9?   Supervision overall   ADLs, transfers, d/c planning, family education    Mobility   CGA to min A, eval amb x100 feet with RW and min A, tuesday limited by orthostatic hypotension after walking bed to bathroom with RW and CGA   supervision/verbal cuing, and CGA for stair navigation 4 steps with L handrail  regulating dizziness/orthostatic hypotension, improving endurance/activity tolerance    Communication                Safety/Cognition/ Behavioral Observations               Pain   No c/o pain   Remain pain free   Assess Qshift and prn    Skin   Slight redness to buttocks, remainder of skin intact  (blanchable)  Maintain skin integrity  Assess Qshift and prn      Discharge Planning:  HOme with family who were providing assist due to high fall risk. Building her strength back. Daughter here daily and particiaptes in therapies   Team Discussion: Patient with dizziness, nausea with ortho stasis and balance issues post respiratory failure event with hx. Of a CVA.  Continue to note coughing; treated with medications. CPAP at HS with purewick PTA; daughter also reports using bed alarms.  Patient on target to meet rehab goals: yes, currently needs supervision for upper body care and min assist for lower body and toileting. Able to ambulate using a RW with CGA but needs cues for use of the walker. Able to ambulate up to 100' and manage steps with CGA.   *See Care Plan and progress notes for long and short-term goals.   Revisions to Treatment Plan:  KUB for nausea SLP recommend GI follow up for esophageal based symptoms; possible EGD due to c/o solid food dysphagia and globus sensation.  Teaching Needs: Safety, medications, transfers, toileting, etc.   Current Barriers to Discharge: Decreased caregiver support  Possible Resolutions to Barriers: Family education HH follow up services DME: Hospital bed     Medical Summary  I attest that I was present, lead the team conference, and concur with the assessment and plan of the team.   Margarito Liner 01/18/2023, 5:22 PM

## 2023-01-18 NOTE — Progress Notes (Signed)
Physical Therapy Session Note  Patient Details  Name: [name] MRN: [id number] Date of Birth: [birth year]  Today's Date: 01/18/2023 PT Individual Time: 0800-0913 PT Individual Time Calculation (min): 73 min   Short Term Goals: Week 1:  PT Short Term Goal 1 (Week 1): =LTGs d/t ELOS  Skilled Therapeutic Interventions/Progress Updates:     Pt seated EOB upon arrival finishing eating her breakfast with translator in room as pt daughter is at work today. Translator present throughout session. Pt reports 2/10 LBP, however with change in position and ambulation, pt reports no pain. Pt pleasant and agreeable to therapy. Pt denies any dizziness today.   Pt reports need to use bathroom. Pt amb 2x15 feet with RW and CGA room<>bathroom verbal and tactile cues provided for walking within walker as pt demos tendency to walk with RW with outstretched arms and lumbar flexion, pt continent of bladder (documented in flowsheets). Pt donned/doffed clothing and performed pericare with supervision. Pt performed STS x 11 throughout session with UE support on arm rest and RW and supervision, intermittent cuing needed for safety, with fatigue.    Switched pt's RW to a youth walker for proper fit. Pt amb 2x90 feet, 1x72 feet, 1x20 feet, prolonged rest breaks required between trials due to fatigue and SOB. Verbal and tactile cuing provided throughout for upright posture and walking within walker for safety.   Pt performed L step ups x10 and R step ups x5 on 6 inch step with B HR s with therapist providing tactile and verbal cuing for glute activation and upright posture. Pt required seated rest break due to fatigue. Pt O2 sat  dropped to 88 after performing step ups, but increased to 97 with diaphragmatic breathing. Pt O2 sat otherwise between 93 and 98. Pt denies dizziness throughout session.   Pt requested to go back to room to change pants as therapist needed to hold pt pants up throughout session. Pt ambulated x15 feet  in room with RW and CGA while reaching to open/close drawers x6 to look for pants. Pt performed sit<>stand with supervision to doff current pants, pt stood with supervision while she folded pants. Therapist donned pants while seated EOB dependently for time purposes, pt performed sit<>stand to donn pants over buttocks, pt requied intermittent min A due to LOB.   Pt performed stand pivot transfer with RW and supervision WC<>bed. Pt performed sitting EOB<>supine with HOB elevated with supervision.     Therapy Documentation Precautions:  Precautions Precautions: Fall Precaution Comments: multiple falls, chronic L hemiplegia (L AFO ordered from [facility] 3/28 PM) Restrictions Weight Bearing Restrictions: No     Therapy/Group: Individual Therapy  [facility] Alfredia Ferguson, [place], DPT  01/18/2023, 7:37 AM

## 2023-01-18 NOTE — Progress Notes (Signed)
PROGRESS NOTE   Subjective/Complaints: Reports continued occasional cough. PT reports dizziness is improved today.    ROS: +cough Denies chest pain shortness of breath, abdominal pain, nausea, vomiting. + dizziness with standing-improved   Objective:   No results found. Recent Labs    01/16/23 0514  WBC 10.0  HGB 10.2*  HCT 31.9*  PLT 479*    Recent Labs    01/16/23 0514  NA 141  K 4.1  CL 102  CO2 27  GLUCOSE 125*  BUN 13  CREATININE 0.88  CALCIUM 8.9     Intake/Output Summary (Last 24 hours) at 01/18/2023 0833 Last data filed at 01/18/2023 0737 Gross per 24 hour  Intake 720 ml  Output 400 ml  Net 320 ml         Physical Exam: Vital Signs Blood pressure 124/68, pulse 67, temperature 98.8 F (37.1 C), resp. rate 16, height 4\' 8"  (1.422 m), weight 61 kg, SpO2 100 %. Gen: no distress, normal appearing HEENT: oral mucosa pink and moist, NCAT, conjugate gaze Cardio: RRR Chest: CTAB, normal effort, normal rate of breathing, good air movement Abd: soft, non-distended, +BS, NT Ext: no edema Psych: pleasant, normal affect Skin: intact Physical Exam Neurological:     Comments: Patient is alert/frail.  Makes eye contact with examiner.  Non-English speaking.  Follows simple demonstrated commands. No focal deficits   Assessment/Plan: 1. Functional deficits which require 3+ hours per day of interdisciplinary therapy in a comprehensive inpatient rehab setting. Physiatrist is providing close team supervision and 24 hour management of active medical problems listed below. Physiatrist and rehab team continue to assess barriers to discharge/monitor patient progress toward functional and medical goals  Care Tool:  Bathing    Body parts bathed by patient: Right arm, Left arm, Chest, Abdomen, Front perineal area, Right upper leg, Left upper leg, Face   Body parts bathed by helper: Buttocks, Right lower leg, Left  lower leg     Bathing assist Assist Level: Minimal Assistance - Patient > 75%     Upper Body Dressing/Undressing Upper body dressing   What is the patient wearing?: Pull over shirt, Bra    Upper body assist Assist Level: Minimal Assistance - Patient > 75%    Lower Body Dressing/Undressing Lower body dressing      What is the patient wearing?: Pants, Underwear/pull up     Lower body assist Assist for lower body dressing: Moderate Assistance - Patient 50 - 74%     Toileting Toileting    Toileting assist Assist for toileting: Minimal Assistance - Patient > 75%     Transfers Chair/bed transfer  Transfers assist     Chair/bed transfer assist level: Contact Guard/Touching assist     Locomotion Ambulation   Ambulation assist      Assist level: Minimal Assistance - Patient > 75% Assistive device: Walker-rolling Max distance: ~100 ft   Walk 10 feet activity   Assist     Assist level: Contact Guard/Touching assist Assistive device: Walker-rolling   Walk 50 feet activity   Assist    Assist level: Minimal Assistance - Patient > 75% Assistive device: Walker-rolling    Walk 150 feet activity  Assist Walk 150 feet activity did not occur: Safety/medical concerns         Walk 10 feet on uneven surface  activity   Assist Walk 10 feet on uneven surfaces activity did not occur: Safety/medical concerns         Wheelchair     Assist Is the patient using a wheelchair?: Yes Type of Wheelchair: Manual    Wheelchair assist level: Maximal Assistance - Patient 25 - 49% (Attempted w/c mobility, but pt was not able to functionally coordinate to effectively propel chair) Max wheelchair distance: 10    Wheelchair 50 feet with 2 turns activity    Assist        Assist Level: Dependent - Patient 0%   Wheelchair 150 feet activity     Assist      Assist Level: Dependent - Patient 0%   Blood pressure 124/68, pulse 67, temperature  98.8 F (37.1 C), resp. rate 16, height 4\' 8"  (1.422 m), weight 61 kg, SpO2 100 %.  Medical Problem List and Plan: 1. Functional deficits secondary to acute respiratory failure exacerbation of underlying subacute or chronic lung disease.  Prednisone 40 mg daily through 01/22/2023 tapering by 10 mg weekly             -patient may shower             -ELOS/Goals: 10-14 days              -Continue CIR  -Team conference today please see physician documentation under team conference tab, met with team  to discuss problems,progress, and goals. Formulized individual treatment plan based on medical history, underlying problem and comorbidities.   2.  Antithrombotics: -DVT/anticoagulation:  Pharmaceutical: Eliquis             -antiplatelet therapy: N/A 3. Pain Management: Lidoderm patch, Tylenol as needed 4. Mood/Behavior/Sleep: Cymbalta 30 mg nightly             -antipsychotic agents: N/A 5. Neuropsych/cognition: This patient is capable of making decisions on her own behalf. 6. Skin/Wound Care: Routine skin checks 7. Fluids/Electrolytes/Nutrition: Routine in and outs with follow-up chemistries 8.  Atrial fibrillation.  Continue Eliquis.  Lopressor 25 mg 3 times daily, cardiac rate controlled  -4/3 HR stable, continue to monitor, Lopressor was changed to BID 9.  Hyperlipidemia.  Pravachol 10.  Diabetes mellitus.  SSI.  Monitor while on prednisone. Magnesium reviewed and is normal.   -4/3 controlled overall, continue to monitor CBG (last 3)  Recent Labs    01/17/23 1626 01/17/23 2128 01/18/23 0549  GLUCAP 159* 236* 117*     11. Obesity: provide dietary education.  12. Nocturia: placed ordered that she may use purewick at night 13. Anemia: iron and B12 reviewed and are normal  -4/1 HGB up to 10.2 today 14. Cough: prn robitussin ordered  -4/3 CXR ordered 15. Suboptimal potassium: klor 73meq ordered x1 on 3/31  -4/1 K+ stable at 4.1 today 16.  Dysphagia.   -Other patient had MBS and  esophagram during acute admission she reports continued issues with coughing  after swallowing thin liquids.  Will place SLP consult for further evaluation -4/2 SLP eval today-signed off -4/3 continue PPI, outpatient GI f/u recommended 17. Seasonal allergies  -4/1 Start loratadine 18. Hypotension, orthostatic  -4/2 Decrease metoprolol from 25mg  TID to 25mg  BI, order abd binder, compression stockings  -4/3 improved    LOS: 4 days A FACE TO FACE EVALUATION WAS PERFORMED  Jennye Boroughs 01/18/2023, 8:33 AM

## 2023-01-18 NOTE — Progress Notes (Signed)
Patient was noted up in her room without assistance. Patient had activated the bed alarm twice since her family left at approximately 1800. Staff went in & toileted her then, but staff have been going to her room since then due to the bed alarm. The NT stated that when she got there this final time, patient was standing up in her room. A telesitter was ordered for safety monitoring until she can be moved closer to the nurses station.

## 2023-01-18 NOTE — Progress Notes (Signed)
Occupational Therapy Session Note  Patient Details  Name: [name] MRN: [id number] Date of Birth: 05-20-1941  Session 1  Today's Date: 01/18/2023 OT Individual Time: [id number] OT Individual Time Calculation (min): 72 min    Session 2 Today's Date: 01/18/2023 OT Individual Time: [id number] OT Individual Time Calculation (min): 52 min  and Today's Date: 01/18/2023 OT Missed Time: 8 Minutes Missed Time Reason: Patient fatigue   Short Term Goals: Week 1:  OT Short Term Goal 1 (Week 1): STG = LTG due to ELOS  Skilled Therapeutic Interventions/Progress Updates:    Session 1 Pt received sitting EOB with no c/o pain and agreeable to session. She completed sit > stand with CGA. Good initiation of Rw use. She completed toileting tasks with CGA overall, voiding urine. She completed 150 ft of functional mobility to the therapy gym with the RW and CGA overall. Patient required increased time for initiation, cuing, rest breaks, and for completion of tasks throughout session. Utilized therapeutic use of self throughout to promote efficiency. She completed dynamic balance activity, stepping over 6 in hurdles with single HHA. Min-mod A overall and poor clearance on the LLE at times. She completed 2x4 trials with rest breaks between. She completed 1x6 blocked practice sit <> stand with no UE support, holding a 3 lb ball to add resistance. She reported need to have BM. She completed 150 ft of functional mobility back to the room with CGA using the RW. She completed toileting tasks with min A overall. She initiated functional mobility and then reported high fatigue and requested to take w/c back to the gym. She completed 10 min of reciprocal stepping with the BUE/BLE on the NuStep to address functional activity tolerance and cardiorespiratory endurance. She required several rest breaks on level 3 resistance.  She returned to her room and was left supine with the bed alarm set. All needs met.    Session 2 Pt  received supine with no c/o pain, agreeable to OT session. She complained of fatigue throughout session. Attempted to capture SpO2 desaturation (or lack thereof) during mobility to assess home oxygen needs. Throughout session patient remained >94% on RA throughout mobility and higher level dynamic demands. She completed 2x obstacle course navigation to address functional activity tolerance and dynamic balance to reduce fall risk. With single HHA she required min A to complete functional turning/ambulation. More consistent readings this session d/t pt's hands being warmer. She was then taken via w/c to the ADL apt to practice TTB use. She completed this transfer with CGA overall, demonstrating familiarity with the transfer. Her daughter previously stated that pt often has a hard time scooting when wet across the bench so will have patient trial this before d/c. She continued to complain of fatigue and requested to return to her room. She completed toileting tasks once more, voiding urine. Brief also had incontinent urine- pt seemingly unaware of this. Pt returned to supine and was left with all needs met, bed alarm set. 8 min missed d/t fatigue.    Therapy Documentation Precautions:  Precautions Precautions: Fall Precaution Comments: multiple falls, chronic L hemiplegia (L AFO ordered from [facility] 3/28 PM) Restrictions Weight Bearing Restrictions: No  Therapy/Group: Individual Therapy  Curtis Sites 01/18/2023, 6:28 AM

## 2023-01-19 DIAGNOSIS — I951 Orthostatic hypotension: Secondary | ICD-10-CM | POA: Diagnosis not present

## 2023-01-19 DIAGNOSIS — J9600 Acute respiratory failure, unspecified whether with hypoxia or hypercapnia: Secondary | ICD-10-CM | POA: Diagnosis not present

## 2023-01-19 DIAGNOSIS — I4891 Unspecified atrial fibrillation: Secondary | ICD-10-CM | POA: Diagnosis not present

## 2023-01-19 DIAGNOSIS — E119 Type 2 diabetes mellitus without complications: Secondary | ICD-10-CM | POA: Diagnosis not present

## 2023-01-19 LAB — GLUCOSE, CAPILLARY
Glucose-Capillary: 103 mg/dL — ABNORMAL HIGH (ref 70–99)
Glucose-Capillary: 118 mg/dL — ABNORMAL HIGH (ref 70–99)
Glucose-Capillary: 211 mg/dL — ABNORMAL HIGH (ref 70–99)
Glucose-Capillary: 172 mg/dL — ABNORMAL HIGH (ref 70–99)

## 2023-01-19 NOTE — Progress Notes (Signed)
Physical Therapy Session Note  Patient Details  Name: [name] MRN: [id number] Date of Birth: 1941/09/29  Today's Date: 01/19/2023 PT Individual Time: 1406-1500 PT Individual Time Calculation (min): 54 min   Short Term Goals: Week 1:  PT Short Term Goal 1 (Week 1): =LTGs d/t ELOS  Skilled Therapeutic Interventions/Progress Updates: Pt presented in bed with [name] interpreter present throughout session. Pt states very tired and has pain in sternum/chest 8/10. Nsg notified and when nsg asked pt only indicated leg pain 7/10 with pain meds provided. Performed bed mobility with supervision, use of bed features and increased time. While sitting EOB pt received meds and noted coughing after drinking water. Per interpreter this has happened all week, sent secure chat to primary team and informed later after session that appears to be baseline. Pt requesting to use bathroom. Stood with supervision and ambulated with light CGA to bathroom. Performed toilet transfers with supervision and performed 3/3 toileting tasks at supervision level. Once completed pt ambulated to sink to complete hand hygiene then transferred to w/c. Pt transported to day room for energy conservation and performed stand pivot to high/low mat. Pt participated in standing balance/activity tolerance tasks with intermittent rest breaks throughout. Participated in x 2 rounds of corn hole once with RW and once without with moderate challenges. Pt also participated in stranding ball taps with 1lb weighted dowel 2 x 15. All standing tasks performed with CGA and only limited by fatigue. Pt then ambulated to NuStep and participated in NuStep activity initially x 8 min however significant fatigue verbalized at 4 min and after extended rest break was only able to complete an additional x 2 min for a total of 6 at levels 3 and 5. Once completed pt transported back to room and performed ambulatory transfer to bed. Pt transferred sit to supine with supervision  and increased time. Pt repositioned for comfort and left with bed alarm on, call bell within reach and needs met.      Therapy Documentation Precautions:  Precautions Precautions: Fall Precaution Comments: multiple falls, chronic L hemiplegia (L AFO ordered from [facility] 3/28 PM) Restrictions Weight Bearing Restrictions: No General:   Vital Signs: Therapy Vitals Temp: 98.1 F (36.7 C) Pulse Rate: 67 Resp: 17 BP: 117/64 Patient Position (if appropriate): Lying Oxygen Therapy SpO2: 99 % O2 Device: Room Air    Therapy/Group: Individual Therapy  Kinnley Paulson 01/19/2023, 3:58 PM

## 2023-01-19 NOTE — Progress Notes (Signed)
Occupational Therapy Session Note  Patient Details  Name: [name] MRN: [id number] Date of Birth: 11/26/1940  Today's Date: 01/19/2023 OT Individual Time: 1100-1155 OT Individual Time Calculation (min): 55 min    Skilled Therapeutic Interventions/Progress Updates: Patient received resting in bed. Reported being tired but willing to participate. Interpreter present. Patient seen for self care as listed below, functional mobility and basic IADL performance working on energy conservation. Patient reports she cooks for her family, up to 16 per meal, and enjoys taking care of her family in this way. Ambulated to kitchen from room with RW and one seated rest break. Once in the kitchen educated on energy conservation and set up to improve independence and prevent her from trying to get items on high or low shelves. Educated on safely using RW while getting items from cupboards. Patient stood from kitchen table with close SBA to walker. Ambulated with walker facing lower shelves to gather items on lowest upper cabinet. Cued to move walker to the side and use the counter for balance when obtaining items from lower cabinets. Patient with good tolerance of standing tasks. Reports not using oven or microwave. Reports that family can do all the cooking until she is ready. The patient appears to take pride in her role in the house in being able to provide fresh healthy meals for everyone. Discussed the importance to getting back to meaningful activities while still attending to her current endurance limitations. Patient did not report any concerns with home obstacles.  Continue with skilled OT POC to prepare patient for a safe discharge home.     Therapy Documentation Precautions:  Precautions Precautions: Fall Precaution Comments: multiple falls, chronic L hemiplegia (L AFO ordered from [facility] 3/28 PM) Restrictions Weight Bearing Restrictions: No General:   Vital Signs: O2 ranged from 89%-97% with activity.     Pain:Reports general ache in B knees, but says she doesn't want medication or let it limit her activity at home.   [id number] assisted with toileting at start of treatment session. Ambulated to bathroom with RW SBA. Supervision provided but patient able to perform peri care and clothing management without assist by therapist.  Stood for grooming tasks at sink following toileting.   Balance Benefits from using RW with IADL tasks that require reaching.       Therapy/Group: Individual Therapy  Hermina Barters 01/19/2023, 11:57 AM

## 2023-01-19 NOTE — Progress Notes (Signed)
Physical Therapy Session Note  Patient Details  Name: [name] MRN: [id number] Date of Birth: October 27, 1940  Today's Date: 01/19/2023 PT Individual Time: 0800-0912 PT Individual Time Calculation (min): 72 min   Short Term Goals: Week 1:  PT Short Term Goal 1 (Week 1): =LTGs d/t ELOS  Skilled Therapeutic Interventions/Progress Updates:    Pt seated EOB eating breakfast upon arrival with interpretor in room. Interpreter present throughout session to translate. Pt agreeable to therapy. Pt reports 5/10 pain in low back, pt reports she has not taken any pain medicine and does not want any. Pt reports yesterday pain decreased with walking and she wants to try that again today. Pt denies any dizziness.   Pt ambulated 2x10 feet bed <>bathroom with RW and supervision, moderate verbal cuing provided for safety with RW.   Pt ambulated 150 feet from room to [place] with RW and supervision, verbal cues provided to stay close to RW. Pt reports 0/10 pain after walking. Pt reports mild dizziness. BP 126/72, HR 68, unable to get oxygen reading. Pt asymptomatic after several minute seated rest break  Pt ascended/descended 2x4 6 inch steps with B UE support on L ascending handrail and CGA for stability. Pt reports need to use bathroom. Pt continent of bowel (documented in flowsheets). Pt performed pericare while seated with supervision. Pt performed STS and donned pants with CGA for stability.   Pt ambulated 10 feet to bed with RW and supervision. Pt performed sit <>supine, rolling L and R, supine<>sit with no handrails, and supervision with increased time, verbal cuing provided for pushing onto elbow and using legs and leverage for supine to sit.   Pt performed STS x 10 from hospital bed with no AD and supervision, verbal and tactile cuing provided for glute activation and upright posture.   Pt performed 1x10 B marching with B UE support on walker and CGA, verbal cues provided for completion through full  range, upright posture and slow and controlled movement.   Pt performed sit <> stand, stand to sit throughout session with supervision, verbal cues provided for UE placement to reduce rocking of walker for safety.   Pt supine in bed at end of session, with bed alarm on and all needs within reach     Therapy Documentation Precautions:  Precautions Precautions: Fall Precaution Comments: multiple falls, chronic L hemiplegia (L AFO ordered from [facility] 3/28 PM) Restrictions Weight Bearing Restrictions: No  Therapy/Group: Individual Therapy  Alfredia Ferguson 01/19/2023, 7:29 AM

## 2023-01-19 NOTE — Progress Notes (Signed)
PROGRESS NOTE   Subjective/Complaints: Reports she feels well today. Cough is improved. Therapy reports she mentioned slight dizziness after walking longer distance but BP/Orthostatics stable.   ROS: +cough-improved Denies chest pain shortness of breath, abdominal pain, nausea, vomiting. + dizziness with standing-improved   Objective:   DG Chest 2 View  Result Date: 01/18/2023 CLINICAL DATA:  Cough. EXAM: CHEST - 2 VIEW COMPARISON:  CT 01/09/2023. Radiographs 01/09/2023, 01/08/2023 and 01/04/2023. FINDINGS: 1557 hours. The heart size and mediastinal contours are stable. Previously demonstrated diffuse bilateral airspace opacities have partially cleared in the interval. No progressive airspace disease, significant pleural effusion or pneumothorax identified. Grossly stable T12 compression deformity and thoracolumbar scoliosis. IMPRESSION: Interval partial clearing of diffuse bilateral airspace opacities. No new findings. Electronically Signed   By: Richardean Sale M.D.   On: 01/18/2023 16:10   No results for input(s): "WBC", "HGB", "HCT", "PLT" in the last 72 hours.  No results for input(s): "NA", "K", "CL", "CO2", "GLUCOSE", "BUN", "CREATININE", "CALCIUM" in the last 72 hours.   Intake/Output Summary (Last 24 hours) at 01/19/2023 0819 Last data filed at 01/19/2023 0550 Gross per 24 hour  Intake 240 ml  Output 600 ml  Net -360 ml         Physical Exam: Vital Signs Blood pressure 114/71, pulse 64, temperature 98 F (36.7 C), resp. rate 16, height 4\' 8"  (1.422 m), weight 61 kg, SpO2 95 %. Gen: no distress, normal appearing, sitting edge of bed HEENT: oral mucosa pink and moist, NCAT, conjugate gaze Cardio: RRR Chest: CTAB, normal effort, normal rate of breathing, good air movement Abd: soft, non-distended, +BS, NT Ext: no edema Psych: pleasant, normal affect Skin: intact Physical Exam Neurological:     Comments: Patient is  alert/frail.  Makes eye contact with examiner.  Non-English speaking. Interpreter in room to assist.  Follows simple demonstrated commands. No focal deficits   Assessment/Plan: 1. Functional deficits which require 3+ hours per day of interdisciplinary therapy in a comprehensive inpatient rehab setting. Physiatrist is providing close team supervision and 24 hour management of active medical problems listed below. Physiatrist and rehab team continue to assess barriers to discharge/monitor patient progress toward functional and medical goals  Care Tool:  Bathing    Body parts bathed by patient: Right arm, Left arm, Chest, Abdomen, Front perineal area, Right upper leg, Left upper leg, Face   Body parts bathed by helper: Buttocks, Right lower leg, Left lower leg     Bathing assist Assist Level: Minimal Assistance - Patient > 75%     Upper Body Dressing/Undressing Upper body dressing   What is the patient wearing?: Pull over shirt, Bra    Upper body assist Assist Level: Minimal Assistance - Patient > 75%    Lower Body Dressing/Undressing Lower body dressing      What is the patient wearing?: Pants, Underwear/pull up     Lower body assist Assist for lower body dressing: Moderate Assistance - Patient 50 - 74%     Toileting Toileting    Toileting assist Assist for toileting: Minimal Assistance - Patient > 75%     Transfers Chair/bed transfer  Transfers assist     Chair/bed transfer  assist level: Contact Guard/Touching assist     Locomotion Ambulation   Ambulation assist      Assist level: Minimal Assistance - Patient > 75% Assistive device: Walker-rolling Max distance: ~100 ft   Walk 10 feet activity   Assist     Assist level: Contact Guard/Touching assist Assistive device: Walker-rolling   Walk 50 feet activity   Assist    Assist level: Minimal Assistance - Patient > 75% Assistive device: Walker-rolling    Walk 150 feet activity   Assist  Walk 150 feet activity did not occur: Safety/medical concerns         Walk 10 feet on uneven surface  activity   Assist Walk 10 feet on uneven surfaces activity did not occur: Safety/medical concerns         Wheelchair     Assist Is the patient using a wheelchair?: Yes Type of Wheelchair: Manual    Wheelchair assist level: Maximal Assistance - Patient 25 - 49% (Attempted w/c mobility, but pt was not able to functionally coordinate to effectively propel chair) Max wheelchair distance: 10    Wheelchair 50 feet with 2 turns activity    Assist        Assist Level: Dependent - Patient 0%   Wheelchair 150 feet activity     Assist      Assist Level: Dependent - Patient 0%   Blood pressure 114/71, pulse 64, temperature 98 F (36.7 C), resp. rate 16, height 4\' 8"  (1.422 m), weight 61 kg, SpO2 95 %.  Medical Problem List and Plan: 1. Functional deficits secondary to acute respiratory failure exacerbation of underlying subacute or chronic lung disease.  Prednisone 40 mg daily through 01/22/2023 tapering by 10 mg weekly             -patient may shower             -ELOS/Goals: 10-14 days              -Continue CIR  -4/6 expected DC   2.  Antithrombotics: -DVT/anticoagulation:  Pharmaceutical: Eliquis             -antiplatelet therapy: N/A 3. Pain Management: Lidoderm patch, Tylenol as needed 4. Mood/Behavior/Sleep: Cymbalta 30 mg nightly             -antipsychotic agents: N/A 5. Neuropsych/cognition: This patient is capable of making decisions on her own behalf. 6. Skin/Wound Care: Routine skin checks 7. Fluids/Electrolytes/Nutrition: Routine in and outs with follow-up chemistries 8.  Atrial fibrillation.  Continue Eliquis.  Lopressor 25 mg 3 times daily, cardiac rate controlled  -4/3 HR stable, continue to monitor, Lopressor was changed to BID  4/4 HR stable 9.  Hyperlipidemia.  Pravachol 10.  Diabetes mellitus.  SSI.  Monitor while on prednisone.  Magnesium reviewed and is normal.   -4/3 controlled overall, continue to monitor  -4/4 stable, likely to  improve as prednisone is tapered CBG (last 3)  Recent Labs    01/18/23 1656 01/18/23 2116 01/19/23 0649  GLUCAP 213* 137* 103*     11. Obesity: provide dietary education.  12. Nocturia: placed ordered that she may use purewick at night 13. Anemia: iron and B12 reviewed and are normal  -4/1 HGB up to 10.2 today 14. Cough: prn robitussin ordered  -4/3 CXR ordered- stable  -4/4 reports cough is improved 15. Suboptimal potassium: klor 81meq ordered x1 on 3/31  -4/1 K+ stable at 4.1 today 16.  Dysphagia.   -Other patient had MBS and esophagram  during acute admission she reports continued issues with coughing  after swallowing thin liquids.  Will place SLP consult for further evaluation -4/2 SLP eval today-signed off -4/3 continue PPI, outpatient GI f/u recommended 17. Seasonal allergies  -4/1 Start loratadine 18. Hypotension, orthostatic  -4/2 Decrease metoprolol from 25mg  TID to 25mg  BI, order abd binder, compression stockings  -4/4 improved, continue to monitor    LOS: 5 days A FACE TO FACE EVALUATION WAS PERFORMED  Jennye Boroughs 01/19/2023, 8:19 AM

## 2023-01-20 ENCOUNTER — Other Ambulatory Visit (HOSPITAL_COMMUNITY): Payer: Self-pay

## 2023-01-20 ENCOUNTER — Telehealth (HOSPITAL_COMMUNITY): Payer: Self-pay | Admitting: Pharmacy Technician

## 2023-01-20 DIAGNOSIS — E119 Type 2 diabetes mellitus without complications: Secondary | ICD-10-CM | POA: Diagnosis not present

## 2023-01-20 DIAGNOSIS — J9600 Acute respiratory failure, unspecified whether with hypoxia or hypercapnia: Secondary | ICD-10-CM | POA: Diagnosis not present

## 2023-01-20 DIAGNOSIS — I4891 Unspecified atrial fibrillation: Secondary | ICD-10-CM | POA: Diagnosis not present

## 2023-01-20 DIAGNOSIS — I739 Peripheral vascular disease, unspecified: Secondary | ICD-10-CM

## 2023-01-20 DIAGNOSIS — I951 Orthostatic hypotension: Secondary | ICD-10-CM | POA: Diagnosis not present

## 2023-01-20 LAB — GLUCOSE, CAPILLARY
Glucose-Capillary: 95 mg/dL (ref 70–99)
Glucose-Capillary: 103 mg/dL — ABNORMAL HIGH (ref 70–99)
Glucose-Capillary: 230 mg/dL — ABNORMAL HIGH (ref 70–99)
Glucose-Capillary: 165 mg/dL — ABNORMAL HIGH (ref 70–99)

## 2023-01-20 MED ORDER — PANTOPRAZOLE SODIUM 40 MG PO TBEC
40.0000 mg | DELAYED_RELEASE_TABLET | Freq: Every day | ORAL | 0 refills | Status: AC
  Filled 2023-01-20: qty 30, 30d supply, fill #0

## 2023-01-20 MED ORDER — PRAVASTATIN SODIUM 20 MG PO TABS
20.0000 mg | ORAL_TABLET | Freq: Every day | ORAL | 0 refills | Status: AC
  Filled 2023-01-20: qty 30, 30d supply, fill #0

## 2023-01-20 MED ORDER — APIXABAN 2.5 MG PO TABS
2.5000 mg | ORAL_TABLET | Freq: Two times a day (BID) | ORAL | 0 refills | Status: AC
  Filled 2023-01-20: qty 60, 30d supply, fill #0

## 2023-01-20 MED ORDER — GUAIFENESIN ER 600 MG PO TB12
600.0000 mg | ORAL_TABLET | Freq: Two times a day (BID) | ORAL | 0 refills | Status: AC | PRN
  Filled 2023-01-20: qty 20, 10d supply, fill #0

## 2023-01-20 MED ORDER — PREDNISONE 10 MG PO TABS
ORAL_TABLET | ORAL | 0 refills | Status: AC
  Filled 2023-01-20: qty 58, 25d supply, fill #0

## 2023-01-20 MED ORDER — METOPROLOL TARTRATE 25 MG PO TABS
25.0000 mg | ORAL_TABLET | Freq: Two times a day (BID) | ORAL | 0 refills | Status: AC
  Filled 2023-01-20: qty 60, 30d supply, fill #0

## 2023-01-20 MED ORDER — DULOXETINE HCL 30 MG PO CPEP
30.0000 mg | ORAL_CAPSULE | Freq: Every day | ORAL | 0 refills | Status: AC
  Filled 2023-01-20: qty 30, 30d supply, fill #0

## 2023-01-20 MED ORDER — METFORMIN HCL 500 MG PO TABS
500.0000 mg | ORAL_TABLET | Freq: Two times a day (BID) | ORAL | 0 refills | Status: AC
  Filled 2023-01-20: qty 60, 30d supply, fill #0

## 2023-01-20 MED ORDER — LIDOCAINE 5 % EX PTCH
2.0000 | MEDICATED_PATCH | Freq: Every day | CUTANEOUS | 0 refills | Status: AC | PRN
  Filled 2023-01-20 (×2): qty 30, 15d supply, fill #0

## 2023-01-20 NOTE — Progress Notes (Addendum)
PROGRESS NOTE   Subjective/Complaints: Continues to have intermittent cough.   ROS: +cough-improved Denies chest pain shortness of breath, abdominal pain, nausea, vomiting. + dizziness with standing-improved   Objective:   DG Chest 2 View  Result Date: 01/18/2023 CLINICAL DATA:  Cough. EXAM: CHEST - 2 VIEW COMPARISON:  CT 01/09/2023. Radiographs 01/09/2023, 01/08/2023 and 01/04/2023. FINDINGS: 1557 hours. The heart size and mediastinal contours are stable. Previously demonstrated diffuse bilateral airspace opacities have partially cleared in the interval. No progressive airspace disease, significant pleural effusion or pneumothorax identified. Grossly stable T12 compression deformity and thoracolumbar scoliosis. IMPRESSION: Interval partial clearing of diffuse bilateral airspace opacities. No new findings. Electronically Signed   By: Carey BullocksWilliam  Veazey M.D.   On: 01/18/2023 16:10   No results for input(s): "WBC", "HGB", "HCT", "PLT" in the last 72 hours.  No results for input(s): "NA", "K", "CL", "CO2", "GLUCOSE", "BUN", "CREATININE", "CALCIUM" in the last 72 hours.   Intake/Output Summary (Last 24 hours) at 01/20/2023 1245 Last data filed at 01/20/2023 0842 Gross per 24 hour  Intake 660 ml  Output --  Net 660 ml         Physical Exam: Vital Signs Blood pressure (!) 147/68, pulse 78, temperature (!) 97.5 F (36.4 C), temperature source Oral, resp. rate 18, height 4\' 8"  (1.422 m), weight 61 kg, SpO2 99 %. Gen: no distress, normal appearing, sitting edge of bed HEENT: oral mucosa pink and moist, NCAT, conjugate gaze Cardio: RRR Chest: CTAB, normal effort, normal rate of breathing, good air movement Abd: soft, non-distended, +BS, NT Ext: no edema, dark discoloration noted in distal feet- she reports this is chronic for several year Psych: pleasant, normal affect Skin: intact Physical Exam Neurological:     Comments: Patient is  alert/frail.  Makes eye contact with examiner.  Non-English speaking. Interpreter in room to assist.  Follows simple demonstrated commands. No focal deficits   Assessment/Plan: 1. Functional deficits which require 3+ hours per day of interdisciplinary therapy in a comprehensive inpatient rehab setting. Physiatrist is providing close team supervision and 24 hour management of active medical problems listed below. Physiatrist and rehab team continue to assess barriers to discharge/monitor patient progress toward functional and medical goals  Care Tool:  Bathing    Body parts bathed by patient: Right arm, Left arm, Chest, Abdomen, Front perineal area, Right upper leg, Left upper leg, Face   Body parts bathed by helper: Buttocks, Right lower leg, Left lower leg     Bathing assist Assist Level: Minimal Assistance - Patient > 75%     Upper Body Dressing/Undressing Upper body dressing   What is the patient wearing?: Pull over shirt, Bra    Upper body assist Assist Level: Minimal Assistance - Patient > 75%    Lower Body Dressing/Undressing Lower body dressing      What is the patient wearing?: Pants, Underwear/pull up     Lower body assist Assist for lower body dressing: Moderate Assistance - Patient 50 - 74%     Toileting Toileting    Toileting assist Assist for toileting: Supervision/Verbal cueing     Transfers Chair/bed transfer  Transfers assist     Chair/bed transfer assist level:  Contact Guard/Touching assist     Locomotion Ambulation   Ambulation assist      Assist level: Minimal Assistance - Patient > 75% Assistive device: Walker-rolling Max distance: ~100 ft   Walk 10 feet activity   Assist     Assist level: Contact Guard/Touching assist Assistive device: Walker-rolling   Walk 50 feet activity   Assist    Assist level: Minimal Assistance - Patient > 75% Assistive device: Walker-rolling    Walk 150 feet activity   Assist Walk 150  feet activity did not occur: Safety/medical concerns         Walk 10 feet on uneven surface  activity   Assist Walk 10 feet on uneven surfaces activity did not occur: Safety/medical concerns         Wheelchair     Assist Is the patient using a wheelchair?: Yes Type of Wheelchair: Manual    Wheelchair assist level: Maximal Assistance - Patient 25 - 49% (Attempted w/c mobility, but pt was not able to functionally coordinate to effectively propel chair) Max wheelchair distance: 10    Wheelchair 50 feet with 2 turns activity    Assist        Assist Level: Dependent - Patient 0%   Wheelchair 150 feet activity     Assist      Assist Level: Dependent - Patient 0%   Blood pressure (!) 147/68, pulse 78, temperature (!) 97.5 F (36.4 C), temperature source Oral, resp. rate 18, height 4\' 8"  (1.422 m), weight 61 kg, SpO2 99 %.  Medical Problem List and Plan: 1. Functional deficits secondary to acute respiratory failure exacerbation of underlying subacute or chronic lung disease.  Prednisone 40 mg daily through 01/22/2023 tapering by 10 mg weekly             -patient may shower             -ELOS/Goals: 10-14 days              -Continue CIR  -4/6 expected DC   2.  Antithrombotics: -DVT/anticoagulation:  Pharmaceutical: Eliquis             -antiplatelet therapy: N/A 3. Pain Management: Lidoderm patch, Tylenol as needed 4. Mood/Behavior/Sleep: Cymbalta 30 mg nightly             -antipsychotic agents: N/A 5. Neuropsych/cognition: This patient is capable of making decisions on her own behalf. 6. Skin/Wound Care: Routine skin checks 7. Fluids/Electrolytes/Nutrition: Routine in and outs with follow-up chemistries 8.  Atrial fibrillation.  Continue Eliquis.  Lopressor 25 mg 3 times daily, cardiac rate controlled  -4/3 HR stable, continue to monitor, Lopressor was changed to BID  4/5 HR controlled 9.  Hyperlipidemia.  Pravachol 10.  Diabetes mellitus.  SSI.  Monitor  while on prednisone. Magnesium reviewed and is normal.   -4/3 controlled overall, continue to monitor  -4/4 stable, likely to  improve as prednisone is tapered  4/5 CBGs stable, continue to monitor CBG (last 3)  Recent Labs    01/19/23 2012 01/20/23 0655 01/20/23 1205  GLUCAP 172* 95 103*     11. Obesity: provide dietary education.  12. Nocturia: placed ordered that she may use purewick at night 13. Anemia: iron and B12 reviewed and are normal  -4/1 HGB up to 10.2 today 14. Cough: prn robitussin ordered  -4/3 CXR ordered- stable  -4/4 reports cough is improved  4/5 Continue robitussin PRN,s he does not use frequently 15. Suboptimal potassium: klor ordered x1  on 3/31  -4/1 K+ stable at 4.1 today 16.  Dysphagia.   -Other patient had MBS and esophagram during acute admission she reports continued issues with coughing  after swallowing thin liquids.  Will place SLP consult for further evaluation -4/2 SLP eval today-signed off -4/3 continue PPI, outpatient GI f/u recommended 17. Seasonal allergies  -4/1 Start loratadine 18. Hypotension, orthostatic  -4/2 Decrease metoprolol from 25mg  TID to 25mg  BI, order abd binder, compression stockings  -4/5 Improved, continue current regimen 19. 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

## 2023-01-20 NOTE — TOC Transition Note (Signed)
Discharge medications (5) are being stored in the main pharmacy on the ground floor until patient is ready for discharge.   

## 2023-01-20 NOTE — Progress Notes (Signed)
Occupational Therapy Session Note  Patient Details  Name: [name] MRN: [id number] Date of Birth: 1941/10/16  Today's Date: 01/20/2023 OT Individual Time: 0539-7673 OT Individual Time Calculation (min): 73 min     Skilled Therapeutic Interventions/Progress Updates: Patient seen for am ADL tasks as listed below. Interpreter present. Worked on education on home safety with patient verbalizing understanding. Patient able to verbalize understanding, but also replies she does not want to be a bother and  hesitates to call for assist. Ambulated to ADL apartment following ADL to work on tub shower transfer to simulate home set up. Patient able to perform transfer with good safety and no verbal cuing. Reports the hospital set up is what she had been doing at home. States that her daughter could be present for tub/shower transfer for safety. Continue with HHOT as ordered to continue progressing independence with self care skills and reduce fall risk in the home.     Therapy Documentation Precautions:  Precautions Precautions: Fall Precaution Comments: multiple falls, chronic L hemiplegia (L AFO ordered from [facility] 3/28 PM) Restrictions Weight Bearing Restrictions: No General:   Vital Signs:O2 93-97%   Pain:Ache in B knees "not bad" rated 5/10 but did not want medication or other intervention.   ADL: ADL Eating: Modified independent Where Assessed-Eating: Chair Grooming: Supervision/safety Where Assessed-Grooming: Standing at sink Upper Body Bathing: Supervision/safety Where Assessed-Upper Body Bathing: Shower Lower Body Bathing: Supervision/safety Where Assessed-Lower Body Bathing: Shower Upper Body Dressing: Supervision/safety Where Assessed-Upper Body Dressing: Edge of bed Lower Body Dressing: Supervision/safety Where Assessed-Lower Body Dressing: Edge of bed Toileting: Supervision/safety Where Assessed-Toileting: Teacher, adult education: Distant supervision Statistician Method:  Proofreader: Raised toilet seat Tub/Shower Transfer: Unable to assess Tub/Shower Transfer Method: Stand pivot Tub/Shower Equipment: Biochemist, clinical Transfer: Distant supervision Film/video editor Method: Designer, industrial/product: Emergency planning/management officer  Balance-Utilized rolling walker for all functional transfers and mobility tasks.      Therapy/Group: Individual Therapy  Warnell Forester 01/20/2023, 12:43 PM

## 2023-01-20 NOTE — Plan of Care (Signed)
  Problem: RH Balance Goal: LTG Patient will maintain dynamic standing with ADLs (OT) Description: LTG:  Patient will maintain dynamic standing balance with assist during activities of daily living (OT)  01/20/2023 1253 by Warnell Forester, OT Outcome: Completed/Met Flowsheets (Taken 01/20/2023 1253) LTG: Pt will maintain dynamic standing balance during ADLs with: Supervision/Verbal cueing 01/20/2023 0908 by Warnell Forester, OT Outcome: Adequate for Discharge   Problem: Sit to Stand Goal: LTG:  Patient will perform sit to stand in prep for activites of daily living with assistance level (OT) Description: LTG:  Patient will perform sit to stand in prep for activites of daily living with assistance level (OT) 01/20/2023 1253 by Warnell Forester, OT Outcome: Completed/Met Flowsheets (Taken 01/20/2023 1253) LTG: PT will perform sit to stand in prep for activites of daily living with assistance level: Supervision/Verbal cueing 01/20/2023 0908 by Warnell Forester, OT Outcome: Adequate for Discharge   Problem: RH Bathing Goal: LTG Patient will bathe all body parts with assist levels (OT) Description: LTG: Patient will bathe all body parts with assist levels (OT) 01/20/2023 1253 by Warnell Forester, OT Outcome: Completed/Met Flowsheets (Taken 01/20/2023 1253) LTG: Pt will perform bathing with assistance level/cueing: Supervision/Verbal cueing 01/20/2023 0908 by Warnell Forester, OT Outcome: Adequate for Discharge   Problem: RH Dressing Goal: LTG Patient will perform upper body dressing (OT) Description: LTG Patient will perform upper body dressing with assist, with/without cues (OT). 01/20/2023 1253 by Warnell Forester, OT Outcome: Completed/Met Flowsheets (Taken 01/20/2023 1253) LTG: Pt will perform upper body dressing with assistance level of: Set up assist 01/20/2023 0908 by Warnell Forester, OT Outcome: Adequate for Discharge Flowsheets (Taken 01/20/2023 0908) LTG: Pt will perform upper body dressing with  assistance level of: Set up assist Goal: LTG Patient will perform lower body dressing w/assist (OT) Description: LTG: Patient will perform lower body dressing with assist, with/without cues in positioning using equipment (OT) Outcome: Completed/Met Flowsheets (Taken 01/20/2023 1253) LTG: Pt will perform lower body dressing with assistance level of: Supervision/Verbal cueing   Problem: RH Toileting Goal: LTG Patient will perform toileting task (3/3 steps) with assistance level (OT) Description: LTG: Patient will perform toileting task (3/3 steps) with assistance level (OT)  Outcome: Completed/Met Flowsheets (Taken 01/20/2023 1253) LTG: Pt will perform toileting task (3/3 steps) with assistance level: Supervision/Verbal cueing   Problem: RH Toilet Transfers Goal: LTG Patient will perform toilet transfers w/assist (OT) Description: LTG: Patient will perform toilet transfers with assist, with/without cues using equipment (OT) 01/20/2023 1253 by Warnell Forester, OT Outcome: Completed/Met Flowsheets (Taken 01/20/2023 1253) LTG: Pt will perform toilet transfers with assistance level of: Supervision/Verbal cueing 01/20/2023 0908 by Warnell Forester, OT Flowsheets (Taken 01/20/2023 0908) LTG: Pt will perform toilet transfers with assistance level of: Supervision/Verbal cueing   Problem: RH Tub/Shower Transfers Goal: LTG Patient will perform tub/shower transfers w/assist (OT) Description: LTG: Patient will perform tub/shower transfers with assist, with/without cues using equipment (OT) 01/20/2023 1253 by Warnell Forester, OT Outcome: Completed/Met 01/20/2023 0908 by Warnell Forester, OT Flowsheets (Taken 01/20/2023 0908) LTG: Pt will perform tub/shower stall transfers with assistance level of: Supervision/Verbal cueing LTG: Pt will perform tub/shower transfers from: Walk in shower

## 2023-01-20 NOTE — Progress Notes (Signed)
Physical Therapy Session Note  Patient Details  Name: [name] MRN: [id number] Date of Birth: 1940-11-24  Today's Date: 01/20/2023 PT Individual Time: 1104-1200 PT Individual Time Calculation (min): 56 min   Short Term Goals: Week 1:  PT Short Term Goal 1 (Week 1): =LTGs d/t ELOS  Skilled Therapeutic Interventions/Progress Updates:     Patient received in recliner and denies pain at start of session, agreeable to therapy session and requesting bathroom first. CGA for sit>stand from recliner and ambulation with RW to bathroom. Pt completed 3/3 toileting tasks with supervision. Pt amb to sink for hand hygiene and ambulated from room to dayroom gym, ~150' with RW and CGA; cues intermittently to maintain close position to RW and ambulate with heel toe pattern to improved foot clearance. Once in gym pt turned to sit on NuStep with CGA.  NuStep set up by therapist and pt performed 1x3 mins, 1 minute rest, 1x 2 minutes. On level 3 with ~44 spm average. Stand pivot transfer NuStep>WC completed with RW and supervision. Pt provided therapeutic  rest break due to fatigue prior to standing balance activities. Airex placed under pt's feet and sit<>stand completed for balance challenge, CGA.  Standing balance challenges: on ariex - UE sholder press 3lbs, 2x10 - bicep curl 3lbs, 2x10 - 8" cone taps, 2.5 lbs on bil LE, 2x10 each LE Seated therapeutic rest break provided, pt then completed dynamic gait challenge with cone/hurdle step overs to facilitate increased hip flexion for foot clearance during gait. 2.5lb ankle weights on bil LE, pt performed 6 bouts 3x 6" hurdle (3 cones next to each other) for forward step over. Pt leading with Rt LE 75% of the time. Seated rest provided and pt completed 4 bouts with cues to lead with Lt LE; pt circumduction Rt foot on follow through 50% of the time. Seated rest in WC provided and pt dependently rolled back to room for energy conservation and time management. Sit<>stand and  ambulatory transfer with HHA completed to move WC>recliner as pt requesting to sit up EOS.  Alarm on and call bell within reach.    Therapy Documentation Precautions:  Precautions Precautions: Fall Precaution Comments: multiple falls, chronic L hemiplegia (L AFO ordered from [facility] 3/28 PM) Restrictions Weight Bearing Restrictions: No  Pain:  Pt denied pain at start. Intermittently c/o aching bil knee's with exercises and ambulation, seated therapeutic rest breaks provided and monitored throughout.   Therapy/Group: Individual Therapy   Wynn Maudlin, DPT Acute Rehabilitation Services Office [phone]  01/20/23 12:47 PM

## 2023-01-20 NOTE — Progress Notes (Signed)
Physical Therapy Discharge Summary  Patient Details  Name: [name] MRN: [id number] Date of Birth: 05/03/1941  Date of Discharge from PT service:January 20, 2023  Today's Date: 01/20/2023 PT Individual Time: 1345-1501 PT Individual Time Calculation (min): 76 min   PT Treatment Session: Patient received sitting EOB and requesting bathroom urgently. Supervision for sit<>stand to RW and for ambulation to bathroom. Pt performed 3/3 toileting tasks with supervision and ambulated to sink for hand hygiene and back to EOB. Patient completed bed mobility at mod I level and following sit>supine pt [facility]. Pt dependently rolled to main gym for energy conservation and stair mobility. Pt completed 4 steps with Lt hand rail and side step pattern, CGA; pt limited by fatigue and unable to complete more steps [facility] Pt ambualted 150' to orthogym and completed car transfer with supervision. Pt completed car transfer with and without step up to simulate SUV runner board. Pt performed curb with reverse step up technique using RW. Seated rest provided in car and pt then ambulated on uneven surface/ramp with CGA and RW. Pt ambulated 200' and then required seated rest break. Repeat sit<>stands performed from standard chair with cues for sequencing hand placement; 6 reps completed. Supervision to rise to RW and ambulate back to room with cues intermittently for walker management. Pt's friend present in room on arrival and pt ambulated to recliner with RW and supervision. Alarm on and call bell within reach. Pt's friend brought a mango. Pt asking if she can eat mango. Discussed mango preparation with SLP and relayed information to pt that she can eat mango in small chunks one piece at at time. Patient reporting she will save it for home.   Patient has met 8 of 8 long term goals due to improved activity tolerance, improved balance, and increased strength.   Patient to discharge at an ambulatory level Supervision.   Patient's care partner is independent to provide the necessary physical and cognitive assistance at discharge.  Reasons goals not met: NA  Recommendation:  Patient will benefit from ongoing skilled PT services in home health setting to continue to advance safe functional mobility, address ongoing impairments in gait, balance, and functional transfers, and minimize fall risk.  Equipment[facility]  Reasons for discharge: treatment goals met  Patient/family agrees with progress made and goals achieved: Yes  PT Discharge Precautions/Restrictions Precautions Precautions: Fall Precaution Comments: multiple falls, chronic L hemiplegia ([facility]) Restrictions Weight Bearing Restrictions: No Vital Signs Therapy Vitals Temp: 98.6 F (37 C) Temp Source: Oral Pulse Rate: (!) 103 Resp: 19 BP: 113/70 Patient Position (if appropriate): Sitting Oxygen Therapy SpO2: (!) 74 % O2 Device: Room Air Pain Pain Assessment Pain Scale: 0-10 Pain Score: 5  Pain Type: Chronic pain Pain Location: Knee Pain Orientation: Left;Right Pain Descriptors / Indicators: Aching Pain Onset: On-going Pain Interference Pain Interference Pain Effect on Sleep: 1. Rarely or not at all Pain Interference with Therapy Activities: 3. Frequently Pain Interference with Day-to-Day Activities: 1. Rarely or not at all Vision/Perception  Vision - History Ability to See in Adequate Light: 0 Adequate Perception Perception: Within Functional Limits Praxis Praxis: Intact  Cognition Overall Cognitive Status: History of cognitive impairments - at baseline Arousal/Alertness: Awake/alert Orientation Level: Oriented X4 Year: 2024 Month: April Day of Week: Incorrect (thursday but got Friday with clue) Attention: Sustained Sustained Attention: Appears intact Memory: Impaired Memory Impairment:  Decreased recall  of new information;Decreased short term memory Decreased Short Term Memory: Verbal basic;Functional basic Awareness: Appears intact Problem Solving: Impaired Problem Solving Impairment: Verbal basic;Functional basic Safety/Judgment: Impaired Comments: Dementia at baseline. 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 Motor - Discharge Observations: Generalized weakness and prior L hemi  Mobility Bed Mobility Bed Mobility: Rolling Right;Rolling Left;Supine to Sit Rolling Right: Independent with assistive device Rolling Left: Independent with assistive device Supine to Sit: Supervision/Verbal cueing Sit to Supine: Supervision/Verbal cueing Transfers Transfers: Sit to Stand;Stand to Sit;Stand Pivot Transfers Sit to Stand: Supervision/Verbal cueing Stand to Sit: Supervision/Verbal cueing Stand Pivot Transfers: Supervision/Verbal cueing Transfer (Assistive device): Rolling walker Locomotion  Gait Ambulation: Yes Gait Assistance: Contact Guard/Touching assist Gait Distance (Feet): 150 Feet Assistive device: Rolling walker Gait Gait: Yes Gait Pattern: Impaired Gait velocity: decr Stairs / Additional Locomotion Stairs: Yes Stairs Assistance: Contact Guard/Touching assist Stair Management Technique: Alternating pattern Number of Stairs: 4 Height of Stairs: 6 Ramp: Contact Guard/touching assist Curb: Contact Guard/Touching assist Pick up small object from the floor assist level: Contact Guard/Touching assist Wheelchair  Mobility Wheelchair Mobility: No  Trunk/Postural Assessment  Cervical Assessment Cervical Assessment: Within Functional Limits Thoracic Assessment Thoracic Assessment: Within Functional Limits Lumbar Assessment Lumbar Assessment: Within Functional Limits Postural Control Postural Control: Deficits on evaluation Righting Reactions: delayed  Balance Balance Balance Assessed: Yes Static Sitting Balance Static Sitting - Balance Support: Feet unsupported Static Sitting - Level of Assistance: 6: Modified independent (Device/Increase time) Dynamic Sitting Balance Dynamic Sitting - Balance Support: Feet supported Dynamic Sitting - Level of Assistance: 6: Modified independent (Device/Increase time) Static Standing Balance Static Standing - Balance Support: During functional activity;Bilateral upper extremity supported Static Standing - Level of Assistance: 6: Modified independent (Device/Increase time) Dynamic Standing Balance Dynamic Standing - Balance Support: During functional activity;Bilateral upper extremity supported Dynamic Standing - Level of Assistance: 5: Stand by assistance Extremity Assessment  RUE Assessment RUE Assessment: Exceptions to [facility] General Strength Comments: 4-/5 grossly LUE Assessment LUE Assessment: Exceptions to [facility] Active Range of Motion (AROM) Comments: Shoulder flexion 110 degrees, WFL distally General Strength Comments: 3+/5 proximally RLE Assessment RLE Assessment: Within Functional Limits RLE Strength Right Hip Flexion: 4/5 Right Hip ABduction: 4/5 Right Hip ADduction: 4/5 Right Knee Flexion: 4/5 Right Knee Extension: 4/5 Right Ankle Dorsiflexion: 4/5 LLE Assessment LLE Assessment: Within Functional Limits LLE Strength Left Hip Flexion: 4-/5 Left Hip ABduction: 4/5 Left Hip ADduction: 4/5 Left Knee Flexion: 4/5 Left Knee Extension: 4/5 Left Ankle Dorsiflexion: 4/5   Wynn Maudlin, DPT Acute Rehabilitation Services Office  [phone]  01/20/23 2:34 PM

## 2023-01-20 NOTE — Progress Notes (Signed)
Inpatient Rehabilitation Discharge Medication Review by a Pharmacist  A complete drug regimen review was completed for this patient to identify any potential clinically significant medication issues.  High Risk Drug Classes Is patient taking? Indication by Medication  Antipsychotic No   Anticoagulant Yes Apixaban - Afib  Antibiotic No   Opioid No   Antiplatelet No   Hypoglycemics/insulin Yes Metformin - DM  Vasoactive Medication Yes Metoprolol - BP, Afib  Chemotherapy No   Other Yes Lidocaine - pain Duloxetine - mood, pain Pantoprazole - reflux Prednisone - COPD exacerbation  Forteo - osteoporosis Pravastatin - HLD Vitamin D- supplement     Type of Medication Issue Identified Description of Issue Recommendation(s)  Drug Interaction(s) (clinically significant)     Duplicate Therapy     Allergy     No Medication Administration End Date     Incorrect Dose     Additional Drug Therapy Needed     Significant med changes from prior encounter (inform family/care partners about these prior to discharge).    Other       Clinically significant medication issues were identified that warrant physician communication and completion of prescribed/recommended actions by midnight of the next day:  No  Pharmacist comments: None  Time spent performing this drug regimen review (minutes):  20 minutes  Okey Regal, PharmD

## 2023-01-20 NOTE — Progress Notes (Signed)
Inpatient Rehabilitation Care Coordinator Discharge Note DC SAT 4/6  Patient Details  Name: [name] MRN: [id number] Date of Birth: [birth year]   Discharge location: HOME WITH DAUGHTER AND HER FAMILY HAS 24/7 CARE  Length of Stay: 7 DAYS  Discharge activity level: CGA LEVEL  Home/community participation: ACTIVE WITH FAMILY  Patient response ES:LPNPYY Literacy - How often do you need to have someone help you when you read instructions, pamphlets, or other written material from your doctor or pharmacy?: Always  Patient response FR:TMYTRZ Isolation - How often do you feel lonely or isolated from those around you?: Never  Services provided included: MD, RD, PT, OT, SLP, RN, CM, Pharmacy, SW  Financial Services:  Financial Services Utilized: Medicare    Choices offered to/list presented to: DAUGHTER  Follow-up services arranged:  Home Health, DME, Patient/Family request agency HH/DME Home Health Agency: [facility]-PT OT RN    DME : [facility] HH/DME Requested Agency: DAUGHTER REQUESTED [facility]  HAS OTHER EQUIPMENT FROM PAST ADMISSIONS Patient response to transportation need: Is the patient able to respond to transportation needs?: Yes In the past 12 months, has lack of transportation kept you from medical appointments or from getting medications?: No In the past 12 months, has lack of transportation kept you from meetings, work, or from getting things needed for daily living?: No    Comments (or additional information):DAUGHTER WAS HERE AND PARTICIPATED IN THERAPIES WITH MOM, ALONG WITH HER OTHER DAUGHTER WHO STAYS WITH HER WHILE Elly WORKS. AWARE OF 24/7 CARE NEEDS AND WAS PROVIDING PRIOR TO ADMISSION  Patient/Family verbalized understanding of follow-up arrangements:  Yes  Individual responsible for coordination of the follow-up plan: Tasia-DAUGHTER [phone]  Confirmed correct DME delivered: Lucy Chris 01/20/2023    Lucy Chris

## 2023-01-20 NOTE — Progress Notes (Signed)
Occupational Therapy Discharge Summary  Patient Details  Name: [name] MRN: [id number] Date of Birth: October 14, 1941  Date of Discharge from OT service:January 21, 2023  Today's Date: 01/20/2023 OT Individual Time: 6195-0932 OT Individual Time Calculation (min): 73 min    Patient has met 8 of 8 long term goals due to improved activity tolerance and improved balance.  Patient to discharge at overall Supervision level.  Patient's care partner is independent to provide the necessary physical and cognitive assistance at discharge.      Recommendation:  Patient will benefit from ongoing skilled OT services in home health setting to continue to advance functional skills in the area of BADL and iADL.  Equipment: No equipment provided  Reasons for discharge: treatment goals met  Patient/family agrees with progress made and goals achieved: Yes  OT Discharge Precautions/Restrictions  Precautions Precautions: Fall Precaution Comments: multiple falls, chronic L hemiplegia (L AFO ordered from [facility] 3/28 PM) Restrictions Weight Bearing Restrictions: No General   Vital Signs  Pain Pain Assessment Pain Scale: 0-10 Pain Score: 5  Pain Type: Chronic pain Pain Location: Knee Pain Orientation: Left;Right Pain Descriptors / Indicators: Aching Pain Onset: On-going ADL ADL Eating: Modified independent Where Assessed-Eating: Chair Grooming: Supervision/safety Where Assessed-Grooming: Standing at sink Upper Body Bathing: Supervision/safety Where Assessed-Upper Body Bathing: Shower Lower Body Bathing: Supervision/safety Where Assessed-Lower Body Bathing: Shower Upper Body Dressing: Supervision/safety Where Assessed-Upper Body Dressing: Edge of bed Lower Body Dressing: Supervision/safety Where Assessed-Lower Body Dressing: Chair Toileting: Supervision/safety Where Assessed-Toileting: Teacher, adult education: Distant supervision Statistician Method: Network engineer: Raised toilet seat Tub/Shower Transfer: Close supervison Web designer Method: Engineer, technical sales: Insurance underwriter: Distant supervision Film/video editor Method: Designer, industrial/product: Sales promotion account executive Baseline Vision/History: 1 Wears glasses Patient Visual Report: No change from baseline Vision Assessment?: No apparent visual deficits Perception  Perception: Within Functional Limits Praxis Praxis: Intact Cognition Cognition Overall Cognitive Status: History of cognitive impairments - at baseline Arousal/Alertness: Awake/alert Orientation Level: Person;Place;Situation Person: Oriented Place: Oriented Situation: Oriented Memory: Impaired Memory Impairment: Decreased recall of new information;Decreased short term memory Decreased Short Term Memory: Verbal basic;Functional basic Attention: Sustained Sustained Attention: Appears intact Awareness: Appears intact Problem Solving: Impaired Problem Solving Impairment: Verbal basic;Functional basic Safety/Judgment: Impaired Comments: Dementia at baseline. Several falls d/t not calling for help from family Brief Interview for Mental Status (BIMS) Repetition of Three Words (First Attempt): 3 Temporal Orientation: Year: Correct Temporal Orientation: Month: Accurate within 5 days Temporal Orientation: Day: Correct Recall: "Sock": Yes, no cue required Recall: "Blue": Yes, after cueing ("a color") Recall: "Bed": Yes, after cueing ("a piece of furniture") BIMS Summary Score: 13 Sensation Sensation Light Touch: Appears Intact Hot/Cold: Appears Intact Proprioception: Appears Intact Stereognosis: Not tested Coordination Gross Motor Movements are Fluid and Coordinated: Yes Fine Motor Movements are Fluid and Coordinated: Yes Coordination and Movement Description: Shuffling gait Motor  Motor Motor: Within Functional Limits Motor - Skilled Clinical  Observations: 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 Motor - Discharge Observations: Generalized weakness and prior L hemi Mobility  Bed Mobility Bed Mobility: Rolling Right;Rolling Left;Supine to Sit Rolling Right: Independent with assistive device Rolling Left: Independent with assistive device Supine to Sit: Supervision/Verbal cueing Sit to Supine: Supervision/Verbal cueing Transfers Sit to Stand: Supervision/Verbal cueing Stand to Sit: Supervision/Verbal cueing  Trunk/Postural Assessment  Cervical Assessment Cervical Assessment: Within Functional Limits Thoracic Assessment Thoracic Assessment: Within Functional Limits Lumbar Assessment Lumbar Assessment: Within Functional Limits Postural Control Postural Control: Deficits on  evaluation  Balance Balance Balance Assessed: No Extremity/Trunk Assessment RUE Assessment RUE Assessment: Exceptions to [facility]FL General Strength Comments: 4-/5 grossly LUE Assessment LUE Assessment: Exceptions to [facility] LLCWFL Active Range of Motion (AROM) Comments: Shoulder flexion 110 degrees, WFL distally General Strength Comments: 3+/5 proximally   Warnell Foresterllison D Acxel Dingee 01/20/2023, 12:50 PM

## 2023-01-20 NOTE — Progress Notes (Signed)
Unable to sleep. Wanted to sit in recliner chair,assisted. Chair alarm on. Purewick off at this time. Call light in reach. Watching tv.

## 2023-01-20 NOTE — Telephone Encounter (Signed)
Patient Advocate Encounter   Received notification that prior authorization for Lidocaine 5% patches is required.   PA submitted on 01/20/2023 Key [id number] Insurance [facility] Medicare Electronic Prior Authorization Request Form Status is pending       Roland Earl, CPhT Pharmacy Patient Advocate Specialist [facility] Pharmacy Patient Advocate Team Direct Number: [phone]  Fax: [phone]

## 2023-01-20 NOTE — Telephone Encounter (Signed)
Patient Advocate Encounter  Prior Authorization for Lidocaine 5% patches has been approved.    PA# [id number] Insurance [facility] Medicare Electronic Prior Authorization Request Form  Effective dates: 01/20/2023 through until further notice      Roland Earl, CPhT Pharmacy Patient Advocate Specialist [facility] Pharmacy Patient Advocate Team Direct Number: [phone]  Fax: [phone]

## 2023-01-21 LAB — GLUCOSE, CAPILLARY
Glucose-Capillary: 114 mg/dL — ABNORMAL HIGH (ref 70–99)
Glucose-Capillary: 134 mg/dL — ABNORMAL HIGH (ref 70–99)

## 2023-02-20 ENCOUNTER — Ambulatory Visit (HOSPITAL_COMMUNITY): Payer: Medicare Other

## 2023-02-20 ENCOUNTER — Ambulatory Visit (HOSPITAL_BASED_OUTPATIENT_CLINIC_OR_DEPARTMENT_OTHER)
Admission: RE | Admit: 2023-02-20 | Discharge: 2023-02-20 | Disposition: A | Discharge status: Home / Self Care | Payer: Medicare Other | Source: Ambulatory Visit | Attending: Student | Admitting: Student

## 2023-02-20 DIAGNOSIS — J8489 Other specified interstitial pulmonary diseases: Secondary | ICD-10-CM

## 2023-02-20 NOTE — Progress Notes (Unsigned)
Synopsis: Referred for dyspnea, wheeze by [name], MD  Subjective:   PATIENT ID: [name] GENDER: female DOB: 02-12-1941, MRN: [id number]  Chief Complaint  Patient presents with   Consult    Patient here to go over CT scan. Pt complains of SOB all the time, wheezing. Just finished prednisone taper 3-4days ago    81yF with history of CVA, AF, dysphagia wwith recent hospitalization for RSV/acute hypoxic respiratory failure with some functional decline since with 2 falls requiring ED visits. She presented to ED 3/20 with persistent cough, fever to 103, confusion and was admitted.    She was found to have multifocal pneumonia and was started on unasyn eventually - though stable O2 requirement, primary team concerned about treatment failure due to her worsening acute metabolic encephalopathy and worsening radiographic extent on CXR and PCCM was consulted, ultimately she was started on prolonged steroid taper for suspected OP, airway clearance. She was discharged 4/5 after completing inpatient rehab.   Just completed steroid course. No clear rebound of symptoms after being off.   No cough now. Less issues with solid/liquid food dysphagia. Still on ppi. May see GI soon.   Otherwise pertinent review of systems is negative.  Did cook over [place] when she lived in [place]. Left [place] 1987.   Past Medical History:  Diagnosis Date   Atrial fibrillation (HCC)    Chronic back pain    "mid back down into lower back" (08/25/2014)   Diabetes mellitus without complication (HCC)    Frequent falls    GERD (gastroesophageal reflux disease)    Hypertriglyceridemia    OSA on CPAP    Osteoarthritis    "knees, hands, back" (08/25/2014)   Pneumonia ~ 2000 X 1   Subdural hematoma (HCC) july 2015   S/P fall while on Coumadin   T12 compression fracture (HCC) 2012   Type II diabetes mellitus (HCC)      Family History  Problem Relation Age of Onset   Diabetes Mellitus II Neg Hx     Hypertension Neg Hx      Past Surgical History:  Procedure Laterality Date   APPENDECTOMY  2012   CATARACT EXTRACTION W/ INTRAOCULAR LENS  IMPLANT, BILATERAL Bilateral 2000's   IR GENERIC HISTORICAL  10/15/2016   IR PERCUTANEOUS ART THROMBECTOMY/INFUSION INTRACRANIAL INC DIAG ANGIO 10/15/2016 [name], MD [facility]   IR GENERIC HISTORICAL  12/13/2016   IR RADIOLOGIST EVAL & MGMT 12/13/2016 [facility]   RADIOLOGY WITH ANESTHESIA N/A 10/15/2016   Procedure: RADIOLOGY WITH ANESTHESIA;  Surgeon: [name], MD;  Location: [facility] OR;  Service: Radiology;  Laterality: N/A;   TOTAL ABDOMINAL HYSTERECTOMY  1990    Social History   Socioeconomic History   Marital status: Widowed    Spouse name: Not on file   Number of children: 2   Years of education: 2   Highest education level: Not on file  Occupational History    Comment: retired  Tobacco Use   Smoking status: Never    Passive exposure: Yes   Smokeless tobacco: Never  Substance and Sexual Activity   Alcohol use: No   Drug use: No   Sexual activity: Never  Other Topics Concern   Not on file  Social History Narrative   ** Merged History Encounter **       Lives w/daughter and grandchildren caffeine coffee, 1/2 cup daily   Social Determinants of Health   Financial Resource Strain: Not on file  Food Insecurity: Not on file  Transportation Needs: Not on file  Physical Activity: Not on file  Stress: Not on file  Social Connections: Not on file  Intimate Partner Violence: Not on file     Allergies  Allergen Reactions   Bactrim [Sulfamethoxazole-Trimethoprim] Cough    And flushing of face and conjunctivitis.      Outpatient Medications Prior to Visit  Medication Sig Dispense Refill   acetaminophen (TYLENOL) 325 MG tablet Take 2 tablets (650 mg total) by mouth every 6 (six) hours as needed for headache or mild pain.     apixaban (ELIQUIS) 2.5 MG TABS tablet Take 1 tablet (2.5 mg total) by mouth 2 (two)  times daily. 60 tablet 0   Calcium Carbonate (CALCIUM-CARB 600 PO) Take 1 tablet by mouth daily. Gummy     cetirizine (ZYRTEC) 10 MG tablet Take 10 mg daily as needed by mouth (seasonal allergies).     Cholecalciferol (VITAMIN D) 50 MCG (2000 UT) tablet Take 1 tablet (2,000 Units total) by mouth daily. 30 tablet 0   DULoxetine (CYMBALTA) 30 MG capsule Take 1 capsule (30 mg total) by mouth at bedtime. 30 capsule 0   guaiFENesin (MUCINEX) 600 MG 12 hr tablet Take 1 tablet (600 mg total) by mouth 2 (two) times daily as needed for cough or to loosen phlegm. 60 tablet 0   lidocaine (LIDODERM) 5 % Place 2 patches onto the skin daily as needed (back pain). Remove & Discard patch within 12 hours or as directed by MD 30 patch 0   metFORMIN (GLUCOPHAGE) 500 MG tablet Take 1 tablet (500 mg total) by mouth 2 (two) times daily with a meal. 60 tablet 0   metoprolol tartrate (LOPRESSOR) 25 MG tablet Take 1 tablet (25 mg total) by mouth 2 (two) times daily. 60 tablet 0   mometasone (NASONEX) 50 MCG/ACT nasal spray Place 2 sprays into the nose daily as needed (Rhinitis).     nitroGLYCERIN (NITROSTAT) 0.4 MG SL tablet Place 1 tablet (0.4 mg total) under the tongue every 5 (five) minutes as needed for chest pain. 10 tablet 12   pantoprazole (PROTONIX) 40 MG tablet Take 1 tablet (40 mg total) by mouth daily. 30 tablet 0   pravastatin (PRAVACHOL) 20 MG tablet Take 1 tablet (20 mg total) by mouth at bedtime. 30 tablet 0   vitamin k 100 MCG tablet Take 100 mcg by mouth daily.     FORTEO 600 MCG/2.4ML SOPN Inject 20 mcg into the skin at bedtime.     predniSONE (DELTASONE) 10 MG tablet Take 4 tablets by mouth daily x 4 days then 3 tablets daily x 7 days then 2 tablets daily x 7 days then 1 tablet  daily x 7 days and stop 58 tablet 0   No facility-administered medications prior to visit.       Objective:   Physical Exam:  General appearance: 82 y.o., female, NAD, conversant  Eyes: anicteric sclerae; PERRL,  tracking appropriately HENT: NCAT; MMM Neck: Trachea midline; no lymphadenopathy, no JVD Lungs: CTAB, no crackles, no wheeze, with normal respiratory effort CV: RRR, no murmur  Abdomen: Soft, non-tender; non-distended, BS present  Extremities: No peripheral edema, warm Skin: Normal turgor and texture; no rash Psych: Appropriate affect Neuro: Alert and oriented to person and place, no focal deficit     Vitals:   02/22/23 1003  BP: 122/78  Pulse: 66  Temp: 97.9 F (36.6 C)  TempSrc: Oral  SpO2: 100%  Weight: 124 lb 6.4 oz (56.4 kg)  Height: 4'  8" (1.422 m)   100% onRA BMI Readings from Last 3 Encounters:  02/22/23 27.89 kg/m  01/14/23 30.15 kg/m  01/04/23 30.15 kg/m   Wt Readings from Last 3 Encounters:  02/22/23 124 lb 6.4 oz (56.4 kg)  01/14/23 134 lb 7.7 oz (61 kg)  01/04/23 134 lb 7.7 oz (61 kg)     CBC    Component Value Date/Time   WBC 10.0 01/16/2023 0514   RBC 2.98 (L) 01/16/2023 0514   HGB 10.2 (L) 01/16/2023 0514   HCT 31.9 (L) 01/16/2023 0514   PLT 479 (H) 01/16/2023 0514   MCV 107.0 (H) 01/16/2023 0514   MCH 34.2 (H) 01/16/2023 0514   MCHC 32.0 01/16/2023 0514   RDW 14.6 01/16/2023 0514   LYMPHSABS 1.5 01/16/2023 0514   MONOABS 1.1 (H) 01/16/2023 0514   EOSABS 0.0 01/16/2023 0514   BASOSABS 0.0 01/16/2023 0514      Chest Imaging: CT Chest 01/09/23 multifocal ggo/consolidation, possible air trapping (obtained at least during partial expiration), stable anterior mediastinal nodule   HRCT CHest 02/20/23 interval improvement in ggo burden, resolution RML nodular consolidation, stable anterior mediastinal nodule  Pulmonary Functions Testing Results:     No data to display              Assessment & Plan:   # 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 Doing overall well after tapering off steroids completely.  Plan: - Full PFTs  (breathing tests) in 2 months on same day as next clinic visit with Dr. Isaiah Serge or Dr. Marchelle Gearing - call back sooner if trouble breathing/persistent cough before then     Omar Person, MD [facility] 02/22/2023 11:59 AM

## 2023-02-22 ENCOUNTER — Ambulatory Visit (INDEPENDENT_AMBULATORY_CARE_PROVIDER_SITE_OTHER): Payer: Medicare Other | Admitting: Student

## 2023-02-22 ENCOUNTER — Encounter: Payer: Self-pay | Admitting: Student

## 2023-02-22 VITALS — BP 122/78 | HR 66 | Temp 97.9°F | Ht <= 58 in | Wt 124.4 lb

## 2023-02-22 DIAGNOSIS — J849 Interstitial pulmonary disease, unspecified: Secondary | ICD-10-CM

## 2023-02-22 NOTE — Patient Instructions (Signed)
-   PFTs (breathing tests) in 2 months on same day as next clinic visit - call back sooner if trouble breathing/persistent cough before then

## 2023-02-27 ENCOUNTER — Telehealth: Payer: Self-pay | Admitting: *Deleted

## 2023-02-27 NOTE — Telephone Encounter (Signed)
Beaulah Corin with [facility] requesting a continuation of OT. Call back [phone].

## 2023-04-27 ENCOUNTER — Ambulatory Visit (INDEPENDENT_AMBULATORY_CARE_PROVIDER_SITE_OTHER): Payer: Medicare Other | Admitting: Pulmonary Disease

## 2023-04-27 ENCOUNTER — Encounter: Payer: Self-pay | Admitting: Pulmonary Disease

## 2023-04-27 VITALS — BP 110/62 | HR 57 | Temp 97.7°F | Ht <= 58 in | Wt 119.6 lb

## 2023-04-27 DIAGNOSIS — J849 Interstitial pulmonary disease, unspecified: Secondary | ICD-10-CM | POA: Diagnosis not present

## 2023-04-27 MED ORDER — BUDESONIDE-FORMOTEROL FUMARATE 160-4.5 MCG/ACT IN AERO: 2.0000 | INHALATION_SPRAY | Freq: Two times a day (BID) | RESPIRATORY_TRACT | 5 refills | Status: AC

## 2023-04-27 NOTE — Progress Notes (Signed)
Patient could not complete the PFT test due to her being weak and not understanding per the interpreter. Physician notified. 

## 2023-04-27 NOTE — Patient Instructions (Signed)
I am glad you are doing stable with regard to breathing Will prescribe the Symbicort 160 inhaler.  Take 2 puffs twice daily Will give a spacer to use with it Get high-res CT in 6 months Return to clinic in 6 months

## 2023-04-27 NOTE — Patient Instructions (Signed)
Patient could not complete the PFT test due to her being weak and not understanding per the interpreter. Physician notified.

## 2023-04-27 NOTE — Progress Notes (Signed)
[name]    [id number]    [birth year]  Primary Care Physician:Dowlen, Jena Gauss, MD  Referring Physician: [name], MD [id number] [name],  [place] [address]  Chief complaint: Follow-up for interstitial lung disease  HPI: 82 y.o. who  has a past medical history of Atrial fibrillation (HCC), Chronic back pain, Diabetes mellitus without complication (HCC), Frequent falls, GERD (gastroesophageal reflux disease), Hypertriglyceridemia, OSA on CPAP, Osteoarthritis, Pneumonia (~ 2000 X 1), Subdural hematoma (HCC) (july 2015), T12 compression fracture (HCC) (2012), and Type II diabetes mellitus (HCC).   She has had recurrent episodes of respiratory failure starting with RSV infection in Christmas 2023 followed by COVID in February 2024.  She was then hospitalized with bacterial pneumonia in March 2024.  PCCM was consulted due to failure to improve.  She was given antibiotics and a long course of prednisone Follow-up CT showed some improvement in infiltrates though she had persistent mosaicism.  Pets: Dog Occupation: Not working Exposures: Exposed to wood smoke in the past.  No ongoing exposures to mold, hot tub, Jacuzzi.  No feather pillows or comforters Smoking history: Never smoker Travel history: Immigrated from [place] in 1987 Relevant family history: No significant family history of lung disease  Outpatient Encounter Medications as of 04/27/2023  Medication Sig   acetaminophen (TYLENOL) 325 MG tablet Take 2 tablets (650 mg total) by mouth every 6 (six) hours as needed for headache or mild pain.   apixaban (ELIQUIS) 2.5 MG TABS tablet Take 1 tablet (2.5 mg total) by mouth 2 (two) times daily.   Calcium Carbonate (CALCIUM-CARB 600 PO) Take 1 tablet by mouth daily. Gummy   cetirizine (ZYRTEC) 10 MG tablet Take 10 mg daily as needed by mouth (seasonal allergies).   Cholecalciferol (VITAMIN D) 50 MCG (2000 UT) tablet Take 1 tablet (2,000 Units total) by mouth daily.    DULoxetine (CYMBALTA) 30 MG capsule Take 1 capsule (30 mg total) by mouth at bedtime.   guaiFENesin (MUCINEX) 600 MG 12 hr tablet Take 1 tablet (600 mg total) by mouth 2 (two) times daily as needed for cough or to loosen phlegm.   lidocaine (LIDODERM) 5 % Place 2 patches onto the skin daily as needed (back pain). Remove & Discard patch within 12 hours or as directed by MD   metFORMIN (GLUCOPHAGE) 500 MG tablet Take 1 tablet (500 mg total) by mouth 2 (two) times daily with a meal.   metoprolol tartrate (LOPRESSOR) 25 MG tablet Take 1 tablet (25 mg total) by mouth 2 (two) times daily.   mometasone (NASONEX) 50 MCG/ACT nasal spray Place 2 sprays into the nose daily as needed (Rhinitis).   pantoprazole (PROTONIX) 40 MG tablet Take 1 tablet (40 mg total) by mouth daily.   pravastatin (PRAVACHOL) 20 MG tablet Take 1 tablet (20 mg total) by mouth at bedtime.   predniSONE (DELTASONE) 10 MG tablet Take 4 tablets by mouth daily x 4 days then 3 tablets daily x 7 days then 2 tablets daily x 7 days then 1 tablet  daily x 7 days and stop   vitamin k 100 MCG tablet Take 100 mcg by mouth daily.   nitroGLYCERIN (NITROSTAT) 0.4 MG SL tablet Place 1 tablet (0.4 mg total) under the tongue every 5 (five) minutes as needed for chest pain. (Patient not taking: Reported on 04/27/2023)   [DISCONTINUED] FORTEO 600 MCG/2.4ML SOPN Inject 20 mcg into the skin at bedtime.   No facility-administered encounter medications on file  as of 04/27/2023.    Allergies as of 04/27/2023 - Review Complete 04/27/2023  Allergen Reaction Noted   Bactrim [sulfamethoxazole-trimethoprim] Cough 10/31/2020    Past Medical History:  Diagnosis Date   Atrial fibrillation (HCC)    Chronic back pain    "mid back down into lower back" (08/25/2014)   Diabetes mellitus without complication (HCC)    Frequent falls    GERD (gastroesophageal reflux disease)    Hypertriglyceridemia    OSA on CPAP    Osteoarthritis    "knees, hands, back" (08/25/2014)    Pneumonia ~ 2000 X 1   Subdural hematoma (HCC) july 2015   S/P fall while on Coumadin   T12 compression fracture (HCC) 2012   Type II diabetes mellitus (HCC)     Past Surgical History:  Procedure Laterality Date   APPENDECTOMY  2012   CATARACT EXTRACTION W/ INTRAOCULAR LENS  IMPLANT, BILATERAL Bilateral 2000's   IR GENERIC HISTORICAL  10/15/2016   IR PERCUTANEOUS ART THROMBECTOMY/INFUSION INTRACRANIAL INC DIAG ANGIO 10/15/2016 [name], MD [facility]   IR GENERIC HISTORICAL  12/13/2016   IR RADIOLOGIST EVAL & MGMT 12/13/2016 [facility]   RADIOLOGY WITH ANESTHESIA N/A 10/15/2016   Procedure: RADIOLOGY WITH ANESTHESIA;  Surgeon: [name], MD;  Location: [facility] OR;  Service: Radiology;  Laterality: N/A;   TOTAL ABDOMINAL HYSTERECTOMY  1990    Family History  Problem Relation Age of Onset   Diabetes Mellitus II Neg Hx    Hypertension Neg Hx     Social History   Socioeconomic History   Marital status: Widowed    Spouse name: Not on file   Number of children: 2   Years of education: 2   Highest education level: Not on file  Occupational History    Comment: retired  Tobacco Use   Smoking status: Never    Passive exposure: Yes   Smokeless tobacco: Never  Substance and Sexual Activity   Alcohol use: No   Drug use: No   Sexual activity: Never  Other Topics Concern   Not on file  Social History Narrative   ** Merged History Encounter **       Lives w/daughter and grandchildren caffeine coffee, 1/2 cup daily   Social Determinants of Health   Financial Resource Strain: Not on file  Food Insecurity: Not on file  Transportation Needs: Not on file  Physical Activity: Not on file  Stress: Not on file  Social Connections: Not on file  Intimate Partner Violence: Not on file    Review of systems: Review of Systems  Constitutional: Negative for fever and chills.  HENT: Negative.   Eyes: Negative for blurred vision.  Respiratory: as per HPI   Cardiovascular: Negative for chest pain and palpitations.  Gastrointestinal: Negative for vomiting, diarrhea, blood per rectum. Genitourinary: Negative for dysuria, urgency, frequency and hematuria.  Musculoskeletal: Negative for myalgias, back pain and joint pain.  Skin: Negative for itching and rash.  Neurological: Negative for dizziness, tremors, focal weakness, seizures and loss of consciousness.  Endo/Heme/Allergies: Negative for environmental allergies.  Psychiatric/Behavioral: Negative for depression, suicidal ideas and hallucinations.  All other systems reviewed and are negative.  Physical Exam: Blood pressure 110/62, pulse (!) 57, temperature 97.7 F (36.5 C), temperature source Oral, height 4\' 9"  (1.448 m), weight 119 lb 9.6 oz (54.3 kg), SpO2 98%. Gen:      No acute distress HEENT:  EOMI, sclera anicteric Neck:     No masses; no thyromegaly Lungs:  Clear to auscultation bilaterally; normal respiratory effort CV:         Regular rate and rhythm; no murmurs Abd:      + bowel sounds; soft, non-tender; no palpable masses, no distension Ext:    No edema; adequate peripheral perfusion Skin:      Warm and dry; no rash Neuro: alert and oriented x 3 Psych: normal mood and affect  Data Reviewed: Imaging: CT chest 01/09/2023-focal areas of infiltration consolidation, small bilateral effusions CT high-resolution 02/20/2023-mosaic parenchymal attenuation I had reviewed the images personally  PFTs: PFTs 04/25/2023-unable to complete  Labs: CTD serologies 01/11/2023-rheumatoid factor 14.5  Assessment:  Interstitial lung disease May have interstitial lung disease due to prior with smoke exposure after the latest CT scan just shows mosaicism which could be due to small airway disease versus bronchiolitis obliterans.  She has had multiple infections recently including RSV, COVID and bacterial pneumonia.  PFTs are unable to be completed today  Will start Symbicort inhaler with spacer as  she may have trouble with coordination Get follow-up high-res CT in 6 months and return to clinic   Plan/Recommendations: Symbicort with spacer Follow-up high-res CT in 6 months.  Chilton Greathouse MD [facility] 04/27/2023, 1:49 PM  CC: [name], MD

## 2023-05-15 ENCOUNTER — Telehealth: Payer: Self-pay

## 2023-05-15 NOTE — Telephone Encounter (Signed)
*  Pulm  Pharmacy Patient Advocate Encounter   Received notification from CoverMyMeds that prior authorization for Budesonide-Formoterol Fumarate 160-4.5MCG/ACT aerosol  is required/requested.   Insurance verification completed.   The patient is insured through [facility] .   Per test claim: PA required; PA submitted to [facility] via CoverMyMeds Key/confirmation #/EOC [id number] Status is pending

## 2023-05-16 NOTE — Telephone Encounter (Signed)
PA has been APPROVED from 05/15/2023 until further notice

## 2023-07-27 ENCOUNTER — Telehealth: Payer: Self-pay

## 2023-07-27 ENCOUNTER — Other Ambulatory Visit: Payer: Self-pay

## 2023-07-27 DIAGNOSIS — M810 Age-related osteoporosis without current pathological fracture: Secondary | ICD-10-CM | POA: Insufficient documentation

## 2023-07-27 NOTE — Telephone Encounter (Signed)
Auth Submission: NO AUTH NEEDED Site of care: Site of care: [facility] Payer: Medicare A/B plus Medicaid Medication & CPT/J Code(s) submitted: Reclast (Zolendronic acid) [id number] Route of submission (phone, fax, portal):  Phone # Fax # Auth type: Buy/Bill PB Units/visits requested: 5mg  x 1 dose Reference number:  Approval from: 07/27/23 to 10/17/23

## 2023-08-16 ENCOUNTER — Ambulatory Visit (INDEPENDENT_AMBULATORY_CARE_PROVIDER_SITE_OTHER): Payer: Medicare Other

## 2023-08-16 VITALS — BP 109/71 | HR 59 | Temp 97.7°F | Resp 20 | Ht <= 58 in | Wt 115.8 lb

## 2023-08-16 DIAGNOSIS — M810 Age-related osteoporosis without current pathological fracture: Secondary | ICD-10-CM

## 2023-08-16 MED ORDER — ZOLEDRONIC ACID 5 MG/100ML IV SOLN
5.0000 mg | Freq: Once | INTRAVENOUS | Status: AC
Start: 2023-08-16 — End: 2023-08-16
  Administered 2023-08-16: 5 mg via INTRAVENOUS
  Filled 2023-08-16: qty 100

## 2023-08-16 MED ORDER — ACETAMINOPHEN 325 MG PO TABS
650.0000 mg | ORAL_TABLET | Freq: Once | ORAL | Status: AC
  Administered 2023-08-16: 650 mg via ORAL
  Filled 2023-08-16: qty 2

## 2023-08-16 MED ORDER — DIPHENHYDRAMINE HCL 25 MG PO CAPS
25.0000 mg | ORAL_CAPSULE | Freq: Once | ORAL | Status: AC
  Administered 2023-08-16: 25 mg via ORAL
  Filled 2023-08-16: qty 1

## 2023-08-16 NOTE — Progress Notes (Signed)
Diagnosis: Osteoporosis  Provider:  Chilton Greathouse MD  Procedure: IV Infusion  IV Type: Peripheral, IV Location: R Antecubital  Reclast (Zolendronic Acid), Dose: 5 mg  Infusion Start Time: 1035  Infusion Stop Time: 1103  Post Infusion IV Care: Patient declined observation and Peripheral IV Discontinued Patient declined to wait full 30 minutes (waited approximately 10-15 minutes). Patient's daughter present at chairside. Educated on signs/symptoms of allergic reaction. Family member verbalized understanding and all questions answered.   Discharge: Condition: Stable, Destination: Home . AVS Provided  Performed by:  Wyvonne Lenz, RN

## 2023-09-22 ENCOUNTER — Other Ambulatory Visit (HOSPITAL_BASED_OUTPATIENT_CLINIC_OR_DEPARTMENT_OTHER): Payer: Medicare Other

## 2023-10-24 ENCOUNTER — Ambulatory Visit (HOSPITAL_BASED_OUTPATIENT_CLINIC_OR_DEPARTMENT_OTHER)
Admission: RE | Admit: 2023-10-24 | Discharge: 2023-10-24 | Disposition: A | Discharge status: Home / Self Care | Payer: Medicare Other | Source: Ambulatory Visit | Attending: Pulmonary Disease | Admitting: Pulmonary Disease

## 2023-10-24 DIAGNOSIS — J849 Interstitial pulmonary disease, unspecified: Secondary | ICD-10-CM | POA: Diagnosis present

## 2023-11-01 ENCOUNTER — Encounter: Payer: Self-pay | Admitting: Sports Medicine

## 2023-11-08 ENCOUNTER — Ambulatory Visit: Payer: Medicare Other | Admitting: Pulmonary Disease

## 2023-11-20 ENCOUNTER — Encounter: Payer: Self-pay | Admitting: Pulmonary Disease

## 2023-11-20 ENCOUNTER — Ambulatory Visit (INDEPENDENT_AMBULATORY_CARE_PROVIDER_SITE_OTHER): Payer: Medicare Other | Admitting: Pulmonary Disease

## 2023-11-20 VITALS — BP 112/63 | HR 60 | Ht <= 58 in | Wt 112.6 lb

## 2023-11-20 DIAGNOSIS — G4733 Obstructive sleep apnea (adult) (pediatric): Secondary | ICD-10-CM | POA: Diagnosis not present

## 2023-11-20 NOTE — Progress Notes (Signed)
[place]    [id number]    1941-07-20  Primary Care Physician:Dowlen, Jena Gauss, MD  Referring Physician: [name], MD [id number] [name],  [place] [address]  Chief complaint: Follow-up for interstitial lung disease  HPI: 83 y.o. who  has a past medical history of Atrial fibrillation (HCC), Chronic back pain, Diabetes mellitus without complication (HCC), Frequent falls, GERD (gastroesophageal reflux disease), Hypertriglyceridemia, OSA on CPAP, Osteoarthritis, Pneumonia (~ 2000 X 1), Subdural hematoma (HCC) (july 2015), T12 compression fracture (HCC) (2012), and Type II diabetes mellitus (HCC).   She has had recurrent episodes of respiratory failure starting with RSV infection in Christmas 2023 followed by COVID in February 2024.  She was then hospitalized with bacterial pneumonia in March 2024.  PCCM was consulted due to failure to improve.  She was given antibiotics and a long course of prednisone Follow-up CT showed some improvement in infiltrates though she had persistent mosaicism.  Pets: Dog Occupation: Not working Exposures: Exposed to wood smoke in the past.  No ongoing exposures to mold, hot tub, Jacuzzi.  No feather pillows or comforters Smoking history: Never smoker Travel history: Immigrated from [place] in 1987 Relevant family history: No significant family history of lung disease  Interim History: Discussed the use of AI scribe software for clinical note transcription with the patient, who gave verbal consent to proceed.  The patient presents for follow-up regarding asthma management and evaluation of pulmonary and liver findings. She is accompanied by her daughter, who assists with translation.  She was previously started on Symbicort with a spacer but has had difficulty using the inhaler due to inadequate inspiratory force. Wheezing occurs primarily during viral infections or pneumonia, about once a year. She has been mostly homebound since last year,  limiting exposure to potential triggers. She experiences sticky phlegm in her throat, possibly related to asthma. A recent CT scan showed no interstitial lung disease but revealed airway changes consistent with asthma, similar to previous findings. There is no improvement or worsening noted.  The CT scan also showed a small amount of fluid in the lungs and liver issues, including cirrhosis, which was not previously mentioned by her doctor. Her liver tests from ten months ago were normal, and she has no history of alcohol use or hepatitis. The CT scan indicated an enlarged blood vessel suggestive of pulmonary hypertension, confirmed by an echocardiogram in early 2024. This may be related to her untreated sleep apnea. She has not followed up with her heart doctor recently.  She has a history of obstructive sleep apnea and was prescribed CPAP, which she does not use. She experiences heavy chest sensations and coughing spells at night when lying down, which may be related to her sleep apnea or acid reflux. She is on Protonix, which has helped reduce choking incidents during eating or drinking.  She has a history of using a wood stove for cooking, which may contribute to respiratory issues.   Outpatient Encounter Medications as of 11/20/2023  Medication Sig   acetaminophen (TYLENOL) 325 MG tablet Take 2 tablets (650 mg total) by mouth every 6 (six) hours as needed for headache or mild pain.   apixaban (ELIQUIS) 2.5 MG TABS tablet Take 1 tablet (2.5 mg total) by mouth 2 (two) times daily.   budesonide-formoterol (SYMBICORT) 160-4.5 MCG/ACT inhaler Inhale 2 puffs into the lungs in the morning and at bedtime.   Calcium Carbonate (CALCIUM-CARB 600 PO) Take 1 tablet by mouth daily. Gummy  cetirizine (ZYRTEC) 10 MG tablet Take 10 mg daily as needed by mouth (seasonal allergies).   Cholecalciferol (VITAMIN D) 50 MCG (2000 UT) tablet Take 1 tablet (2,000 Units total) by mouth daily.   DULoxetine (CYMBALTA) 30 MG  capsule Take 1 capsule (30 mg total) by mouth at bedtime.   guaiFENesin (MUCINEX) 600 MG 12 hr tablet Take 1 tablet (600 mg total) by mouth 2 (two) times daily as needed for cough or to loosen phlegm.   lidocaine (LIDODERM) 5 % Place 2 patches onto the skin daily as needed (back pain). Remove & Discard patch within 12 hours or as directed by MD   metFORMIN (GLUCOPHAGE) 500 MG tablet Take 1 tablet (500 mg total) by mouth 2 (two) times daily with a meal.   metoprolol tartrate (LOPRESSOR) 25 MG tablet Take 1 tablet (25 mg total) by mouth 2 (two) times daily.   mometasone (NASONEX) 50 MCG/ACT nasal spray Place 2 sprays into the nose daily as needed (Rhinitis).   nitroGLYCERIN (NITROSTAT) 0.4 MG SL tablet Place 1 tablet (0.4 mg total) under the tongue every 5 (five) minutes as needed for chest pain.   pantoprazole (PROTONIX) 40 MG tablet Take 1 tablet (40 mg total) by mouth daily.   pravastatin (PRAVACHOL) 20 MG tablet Take 1 tablet (20 mg total) by mouth at bedtime.   vitamin k 100 MCG tablet Take 100 mcg by mouth daily.   predniSONE (DELTASONE) 10 MG tablet Take 4 tablets by mouth daily x 4 days then 3 tablets daily x 7 days then 2 tablets daily x 7 days then 1 tablet  daily x 7 days and stop (Patient not taking: Reported on 11/20/2023)   No facility-administered encounter medications on file as of 11/20/2023.   Physical Exam: Blood pressure 112/63, pulse 60, height 4\' 8"  (1.422 m), weight 112 lb 9.6 oz (51.1 kg), SpO2 94%. Gen:      No acute distress HEENT:  EOMI, sclera anicteric Neck:     No masses; no thyromegaly Lungs:    Clear to auscultation bilaterally; normal respiratory effort CV:         Regular rate and rhythm; no murmurs Abd:      + bowel sounds; soft, non-tender; no palpable masses, no distension Ext:    No edema; adequate peripheral perfusion Skin:      Warm and dry; no rash Neuro: alert and oriented x 3 Psych: normal mood and affect   Data Reviewed: Imaging: CT chest  01/09/2023-focal areas of infiltration consolidation, small bilateral effusions CT high-resolution 02/20/2023-mosaic parenchymal attenuation CT high resolution 11/05/2023-stable pattern of mosaic parenchymal attenuation I had reviewed the images personally  PFTs: PFTs 04/25/2023-unable to complete  Labs: CTD serologies 01/11/2023-rheumatoid factor 14.5  Assessment: Evaluation for interstitial lung disease High-res CT reviewed with no evidence of interstitial lung disease.  She does have mosaicism which may indicate small airway disease or pulmonary vascular disease such as hypertension. Unable to complete PFTs.  Asthma Chronic asthma with difficulty using inhaler due to lack of inspiratory force. No recent exacerbations. CT shows airway changes consistent with asthma. Discussed potential use of nebulizer as an alternative to inhaler. Daughter mentioned occasional wheezing during viral infections.  - Consider nebulizer if inhaler use remains problematic but patient has issues with using the nebulizer machine as well - Monitor for wheezing or respiratory symptoms - Contact if more wheezing events occur for potential use of nebulizer or prednisone  Obstructive Sleep Apnea Long-standing obstructive sleep apnea. Non-compliance with CPAP. Symptoms  include heavy chest and nocturnal coughing. Discussed importance of CPAP and potential alternatives like an inspire implantable device. Patient reluctant to use CPAP or undergo procedure.  - Emphasize importance of CPAP use - Consider alternative treatments like inspire implantable device if patient is willing - Monitor for worsening symptoms  Pulmonary Hypertension Suspected secondary to obstructive sleep apnea. Echocardiogram in early 2024 showed pulmonary hypertension. Non-compliance with CPAP. Discussed non-CPAP alternatives including an implantable device. Patient reluctant to use CPAP or undergo procedure. - Discuss CPAP use with patient -  Consider non-CPAP alternatives if patient is willing - Monitor for symptoms and fluid levels in lungs  Cirrhosis Incidental finding of cirrhosis on CT. No history of alcohol use or hepatitis. Normal liver function tests from December 2024. Discussed potential causes including fat accumulation, drugs, or viral infection. No immediate intervention required. - Monitor liver function tests periodically  Follow-up - Schedule follow-up as needed based on symptoms - Primary care can refer back if respiratory symptoms increase or fluid levels in lungs rise.   Plan/Recommendations: Continue monitoring Follow-up as needed.  Chilton Greathouse MD [facility] 11/20/2023, 10:21 AM  CC: [name], MD

## 2023-11-20 NOTE — Patient Instructions (Signed)
VISIT SUMMARY:  During today's visit, we discussed your asthma management, obstructive sleep apnea, pulmonary hypertension, and an incidental finding of cirrhosis. We reviewed your recent CT scan and echocardiogram results, and we talked about potential changes to your treatment plan to better manage your symptoms.  YOUR PLAN:  -ASTHMA: Asthma is a condition where your airways become inflamed and narrow, making it hard to breathe. Since you have difficulty using the inhaler, we discussed the possibility of using a nebulizer instead. Please monitor for any wheezing or respiratory symptoms and contact us if these occur more frequently so we can consider using a nebulizer or prednisone.  -OBSTRUCTIVE SLEEP APNEA: Obstructive sleep apnea is a condition where your breathing stops and starts during sleep. You have not been using your CPAP machine, which is important for managing this condition. We discussed the importance of using CPAP and potential alternatives like an implantable device if you are willing. Please monitor for any worsening symptoms.  -PULMONARY HYPERTENSION: Pulmonary hypertension is high blood pressure in the lungs' arteries, often related to untreated sleep apnea. Your echocardiogram showed this condition. We discussed the importance of using CPAP and potential alternatives. Please monitor for symptoms and fluid levels in your lungs.  -CIRRHOSIS: Cirrhosis is scarring of the liver often due to long-term damage. Your CT scan showed this, but your liver function tests were normal, and you have no history of alcohol use or hepatitis. We discussed potential causes and will monitor your liver function tests periodically.  INSTRUCTIONS:  Please schedule follow-up appointments as needed based on your symptoms. If your respiratory symptoms increase or fluid levels in your lungs rise, your primary care doctor can refer you back to [place].

## 2024-03-03 ENCOUNTER — Other Ambulatory Visit: Payer: Self-pay | Admitting: Pulmonary Disease

## 2024-08-07 ENCOUNTER — Other Ambulatory Visit (HOSPITAL_COMMUNITY): Payer: Self-pay | Admitting: Sports Medicine

## 2024-08-21 ENCOUNTER — Ambulatory Visit

## 2024-08-28 ENCOUNTER — Ambulatory Visit

## 2024-09-04 ENCOUNTER — Ambulatory Visit

## 2024-09-04 MED ORDER — ZOLEDRONIC ACID 5 MG/100ML IV SOLN: 5.0000 mg | Freq: Once | INTRAVENOUS | Status: DC

## 2024-09-04 MED ORDER — DIPHENHYDRAMINE HCL 25 MG PO CAPS: 25.0000 mg | ORAL_CAPSULE | Freq: Once | ORAL | Status: DC

## 2024-09-04 MED ORDER — ACETAMINOPHEN 325 MG PO TABS: 650.0000 mg | ORAL_TABLET | Freq: Once | ORAL | Status: DC

## 2024-09-25 ENCOUNTER — Ambulatory Visit (INDEPENDENT_AMBULATORY_CARE_PROVIDER_SITE_OTHER)

## 2024-09-25 VITALS — BP 100/66 | HR 79 | Temp 97.7°F | Resp 14 | Ht <= 58 in | Wt 111.4 lb

## 2024-09-25 DIAGNOSIS — M810 Age-related osteoporosis without current pathological fracture: Secondary | ICD-10-CM

## 2024-09-25 MED ORDER — DIPHENHYDRAMINE HCL 25 MG PO CAPS
25.0000 mg | ORAL_CAPSULE | Freq: Once | ORAL | Status: AC
  Administered 2024-09-25: 25 mg via ORAL
  Filled 2024-09-25: qty 1

## 2024-09-25 MED ORDER — ZOLEDRONIC ACID 5 MG/100ML IV SOLN
5.0000 mg | Freq: Once | INTRAVENOUS | Status: AC
  Administered 2024-09-25: 5 mg via INTRAVENOUS
  Filled 2024-09-25: qty 100

## 2024-09-25 MED ORDER — ACETAMINOPHEN 325 MG PO TABS
650.0000 mg | ORAL_TABLET | Freq: Once | ORAL | Status: AC
  Administered 2024-09-25: 650 mg via ORAL
  Filled 2024-09-25: qty 2

## 2024-09-25 NOTE — Progress Notes (Signed)
 Diagnosis: Osteoporosis  Provider:  Mannam, Praveen MD  Procedure: IV Infusion  IV Type: Peripheral, IV Location: L Antecubital   Reclast  (Zolendronic Acid), Dose: 5 mg  Infusion Start Time: 1026  Infusion Stop Time: 1056  Post Infusion IV Care: Observation period completed and Peripheral IV Discontinued  Discharge: Condition: Good, Destination: Home . AVS Declined  Performed by:  Maximiano JONELLE Pouch, LPN
